# Patient Record
Sex: Female | Born: 1970 | Race: Black or African American | Hispanic: No | Marital: Single | Smoking: Former smoker
Health system: Southern US, Community
[De-identification: ages and names within clinical notes are randomized; demographics above are authoritative.]

## PROBLEM LIST (undated history)

## (undated) ENCOUNTER — Observation Stay: Admission: AD | Payer: Self-pay | Source: Ambulatory Visit | Admitting: Cardiology

## (undated) DIAGNOSIS — F319 Bipolar disorder, unspecified: Secondary | ICD-10-CM

## (undated) DIAGNOSIS — Z8719 Personal history of other diseases of the digestive system: Secondary | ICD-10-CM

## (undated) DIAGNOSIS — E119 Type 2 diabetes mellitus without complications: Secondary | ICD-10-CM

## (undated) DIAGNOSIS — F419 Anxiety disorder, unspecified: Secondary | ICD-10-CM

## (undated) DIAGNOSIS — Z9989 Dependence on other enabling machines and devices: Secondary | ICD-10-CM

## (undated) DIAGNOSIS — I639 Cerebral infarction, unspecified: Secondary | ICD-10-CM

## (undated) DIAGNOSIS — R519 Headache, unspecified: Secondary | ICD-10-CM

## (undated) DIAGNOSIS — I10 Essential (primary) hypertension: Secondary | ICD-10-CM

## (undated) DIAGNOSIS — K219 Gastro-esophageal reflux disease without esophagitis: Secondary | ICD-10-CM

## (undated) DIAGNOSIS — M545 Low back pain, unspecified: Secondary | ICD-10-CM

## (undated) DIAGNOSIS — R51 Headache: Secondary | ICD-10-CM

## (undated) DIAGNOSIS — Z8711 Personal history of peptic ulcer disease: Secondary | ICD-10-CM

## (undated) DIAGNOSIS — F329 Major depressive disorder, single episode, unspecified: Secondary | ICD-10-CM

## (undated) DIAGNOSIS — E785 Hyperlipidemia, unspecified: Secondary | ICD-10-CM

## (undated) DIAGNOSIS — M1990 Unspecified osteoarthritis, unspecified site: Secondary | ICD-10-CM

## (undated) DIAGNOSIS — J42 Unspecified chronic bronchitis: Secondary | ICD-10-CM

## (undated) DIAGNOSIS — G43909 Migraine, unspecified, not intractable, without status migrainosus: Secondary | ICD-10-CM

## (undated) DIAGNOSIS — G4733 Obstructive sleep apnea (adult) (pediatric): Secondary | ICD-10-CM

## (undated) DIAGNOSIS — I2510 Atherosclerotic heart disease of native coronary artery without angina pectoris: Secondary | ICD-10-CM

## (undated) DIAGNOSIS — Z780 Asymptomatic menopausal state: Secondary | ICD-10-CM

## (undated) DIAGNOSIS — G8929 Other chronic pain: Secondary | ICD-10-CM

## (undated) DIAGNOSIS — F32A Depression, unspecified: Secondary | ICD-10-CM

## (undated) HISTORY — DX: Major depressive disorder, single episode, unspecified: F32.9

## (undated) HISTORY — DX: Bipolar disorder, unspecified: F31.9

## (undated) HISTORY — DX: Depression, unspecified: F32.A

## (undated) HISTORY — PX: CARPAL TUNNEL RELEASE: SHX101

## (undated) HISTORY — DX: Type 2 diabetes mellitus without complications: E11.9

## (undated) HISTORY — DX: Essential (primary) hypertension: I10

## (undated) HISTORY — DX: Asymptomatic menopausal state: Z78.0

## (undated) HISTORY — PX: BUNIONECTOMY: SHX129

## (undated) HISTORY — PX: HAMMER TOE SURGERY: SHX385

---

## 2005-12-30 HISTORY — PX: ABDOMINAL HYSTERECTOMY: SHX81

## 2008-12-30 DIAGNOSIS — E119 Type 2 diabetes mellitus without complications: Secondary | ICD-10-CM

## 2008-12-30 HISTORY — DX: Type 2 diabetes mellitus without complications: E11.9

## 2009-04-06 ENCOUNTER — Emergency Department (HOSPITAL_COMMUNITY): Admission: EM | Admit: 2009-04-06 | Discharge: 2009-04-06 | Payer: Self-pay | Admitting: Emergency Medicine

## 2009-05-16 ENCOUNTER — Emergency Department (HOSPITAL_COMMUNITY): Admission: EM | Admit: 2009-05-16 | Discharge: 2009-05-16 | Payer: Self-pay | Admitting: Family Medicine

## 2009-05-16 ENCOUNTER — Observation Stay (HOSPITAL_COMMUNITY): Admission: EM | Admit: 2009-05-16 | Discharge: 2009-05-16 | Payer: Self-pay | Admitting: Emergency Medicine

## 2009-06-09 ENCOUNTER — Emergency Department (HOSPITAL_COMMUNITY): Admission: EM | Admit: 2009-06-09 | Discharge: 2009-06-09 | Payer: Self-pay | Admitting: Emergency Medicine

## 2010-11-11 ENCOUNTER — Encounter: Payer: Self-pay | Admitting: Emergency Medicine

## 2010-11-12 ENCOUNTER — Ambulatory Visit: Payer: Self-pay | Admitting: Psychiatry

## 2010-11-12 ENCOUNTER — Inpatient Hospital Stay (HOSPITAL_COMMUNITY)
Admission: AD | Admit: 2010-11-12 | Discharge: 2010-11-14 | Payer: Self-pay | Source: Home / Self Care | Admitting: Psychiatry

## 2010-11-21 ENCOUNTER — Ambulatory Visit (HOSPITAL_COMMUNITY): Payer: Self-pay | Admitting: Psychiatry

## 2010-12-10 ENCOUNTER — Ambulatory Visit (HOSPITAL_COMMUNITY): Payer: Self-pay | Admitting: Marriage and Family Therapist

## 2010-12-12 ENCOUNTER — Ambulatory Visit (HOSPITAL_COMMUNITY): Payer: Self-pay | Admitting: Psychiatry

## 2010-12-19 ENCOUNTER — Ambulatory Visit (HOSPITAL_COMMUNITY): Payer: Self-pay | Admitting: Marriage and Family Therapist

## 2011-01-07 ENCOUNTER — Ambulatory Visit (HOSPITAL_COMMUNITY)
Admission: RE | Admit: 2011-01-07 | Discharge: 2011-01-07 | Payer: Self-pay | Source: Home / Self Care | Attending: Psychiatry | Admitting: Psychiatry

## 2011-01-07 ENCOUNTER — Ambulatory Visit (HOSPITAL_COMMUNITY)
Admission: RE | Admit: 2011-01-07 | Payer: Self-pay | Source: Home / Self Care | Admitting: Marriage and Family Therapist

## 2011-01-09 ENCOUNTER — Ambulatory Visit (HOSPITAL_COMMUNITY)
Admission: RE | Admit: 2011-01-09 | Discharge: 2011-01-09 | Payer: Self-pay | Source: Home / Self Care | Attending: Psychiatry | Admitting: Psychiatry

## 2011-01-16 ENCOUNTER — Ambulatory Visit (HOSPITAL_COMMUNITY)
Admission: RE | Admit: 2011-01-16 | Discharge: 2011-01-16 | Payer: Self-pay | Source: Home / Self Care | Attending: Marriage and Family Therapist | Admitting: Marriage and Family Therapist

## 2011-01-28 IMAGING — CR DG CHEST 1V PORT
1 series · 1 of 1 positions shown · non-contrast
Comparison: None

CLINICAL DATA: Chest pain.  Hypertension.  Smoking history.

PORTABLE CHEST - 1 VIEW

[view not recorded]
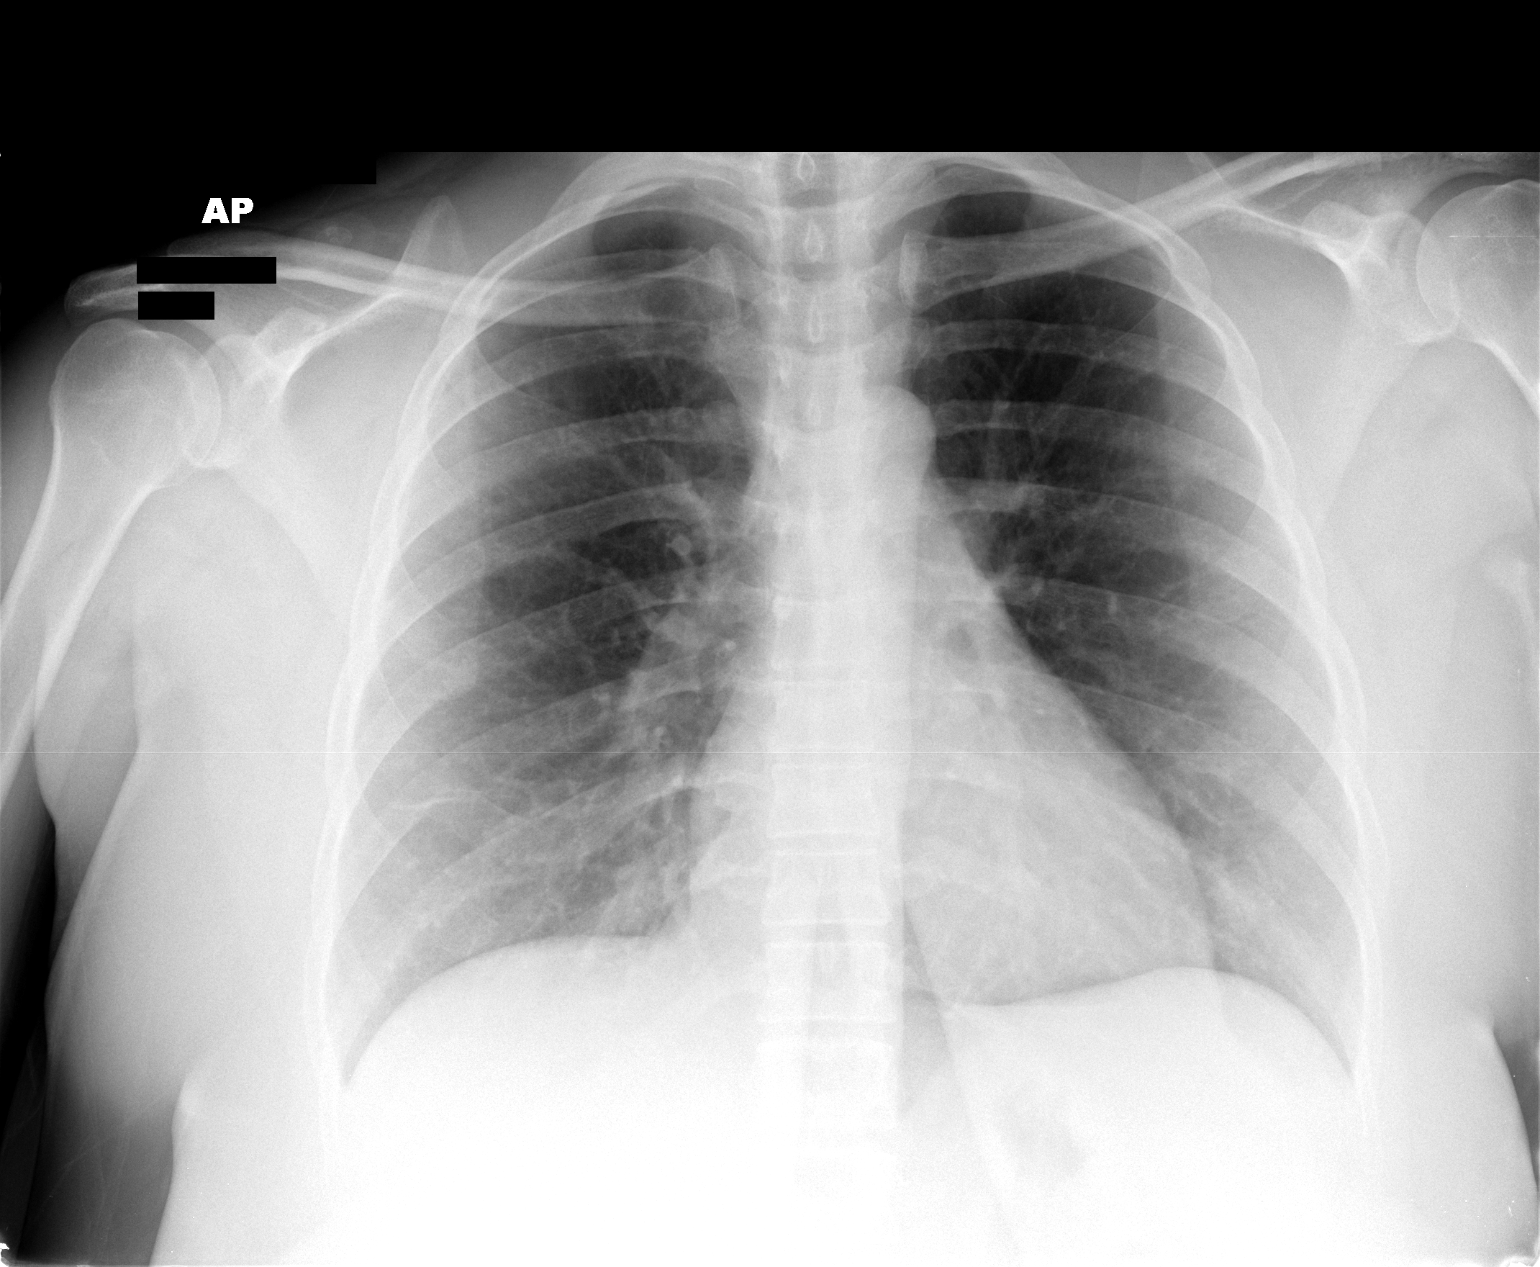

[1 of 1 positions shown; findings below may reference images not displayed]

FINDINGS: Heart size is at the upper limits of normal.  There is
slight interstitial pulmonary prominence.  This probably relates to
the smoking history.  No infiltrate, major collapse or effusion.
Vascularity does not suggest heart failure.
IMPRESSION: Slightly prominent interstitial markings probably relating to a
smoking history.  No active process suspected otherwise.

## 2011-01-29 ENCOUNTER — Ambulatory Visit (HOSPITAL_COMMUNITY)
Admission: RE | Admit: 2011-01-29 | Discharge: 2011-01-29 | Payer: Self-pay | Source: Home / Self Care | Attending: Marriage and Family Therapist | Admitting: Marriage and Family Therapist

## 2011-02-06 ENCOUNTER — Encounter (HOSPITAL_COMMUNITY): Payer: Self-pay | Admitting: Marriage and Family Therapist

## 2011-02-11 ENCOUNTER — Encounter (HOSPITAL_COMMUNITY): Payer: Medicare Other | Admitting: Psychiatry

## 2011-02-11 DIAGNOSIS — F3189 Other bipolar disorder: Secondary | ICD-10-CM

## 2011-02-19 ENCOUNTER — Encounter (HOSPITAL_COMMUNITY): Payer: Medicare Other | Admitting: Marriage and Family Therapist

## 2011-02-19 DIAGNOSIS — F3160 Bipolar disorder, current episode mixed, unspecified: Secondary | ICD-10-CM

## 2011-03-11 ENCOUNTER — Encounter (HOSPITAL_COMMUNITY): Payer: Medicare Other | Admitting: Marriage and Family Therapist

## 2011-03-12 LAB — URINE CULTURE
Colony Count: NO GROWTH
Culture  Setup Time: 201111140002
Culture: NO GROWTH

## 2011-03-12 LAB — COMPREHENSIVE METABOLIC PANEL
ALT: 17 U/L (ref 0–35)
AST: 22 U/L (ref 0–37)
Albumin: 3.9 g/dL (ref 3.5–5.2)
Alkaline Phosphatase: 89 U/L (ref 39–117)
BUN: 10 mg/dL (ref 6–23)
CO2: 24 mEq/L (ref 19–32)
Calcium: 8.8 mg/dL (ref 8.4–10.5)
Chloride: 106 mEq/L (ref 96–112)
Creatinine, Ser: 0.78 mg/dL (ref 0.4–1.2)
GFR calc Af Amer: >60 mL/min (ref >60)
GFR calc non Af Amer: >60 mL/min (ref >60)
Glucose, Bld: 181 mg/dL — ABNORMAL HIGH (ref 70–99)
Potassium: 4 mEq/L (ref 3.5–5.1)
Sodium: 140 mEq/L (ref 135–145)
Total Bilirubin: 0.2 mg/dL — ABNORMAL LOW (ref 0.3–1.2)
Total Protein: 7.4 g/dL (ref 6.0–8.3)

## 2011-03-12 LAB — RAPID URINE DRUG SCREEN, HOSP PERFORMED
Amphetamines: NOT DETECTED
Barbiturates: NOT DETECTED
Benzodiazepines: POSITIVE — AB
Cocaine: NOT DETECTED
Opiates: NOT DETECTED
Tetrahydrocannabinol: NOT DETECTED

## 2011-03-12 LAB — PROTIME-INR
INR: 0.97 (ref 0.00–1.49)
Prothrombin Time: 13.1 seconds (ref 11.6–15.2)

## 2011-03-12 LAB — POCT PREGNANCY, URINE: Preg Test, Ur: NEGATIVE

## 2011-03-12 LAB — CBC
HCT: 40.1 % (ref 36.0–46.0)
Hemoglobin: 13.3 g/dL (ref 12.0–15.0)
MCH: 27.1 pg (ref 26.0–34.0)
MCHC: 33.2 g/dL (ref 30.0–36.0)
MCV: 81.7 fL (ref 78.0–100.0)
Platelets: 208 10*3/uL (ref 150–400)
RBC: 4.91 MIL/uL (ref 3.87–5.11)
RDW: 14.3 % (ref 11.5–15.5)
WBC: 9.3 10*3/uL (ref 4.0–10.5)

## 2011-03-12 LAB — GLUCOSE, CAPILLARY
Glucose-Capillary: 141 mg/dL — ABNORMAL HIGH (ref 70–99)
Glucose-Capillary: 191 mg/dL — ABNORMAL HIGH (ref 70–99)
Glucose-Capillary: 158 mg/dL — ABNORMAL HIGH (ref 70–99)
Glucose-Capillary: 182 mg/dL — ABNORMAL HIGH (ref 70–99)
Glucose-Capillary: 187 mg/dL — ABNORMAL HIGH (ref 70–99)
Glucose-Capillary: 272 mg/dL — ABNORMAL HIGH (ref 70–99)

## 2011-03-12 LAB — ETHANOL
Alcohol, Ethyl (B): <5 mg/dL (ref 0–10)
Alcohol, Ethyl (B): <5 mg/dL (ref 0–10)

## 2011-03-12 LAB — URINALYSIS, ROUTINE W REFLEX MICROSCOPIC
Bilirubin Urine: NEGATIVE
Glucose, UA: 100 mg/dL — AB
Hgb urine dipstick: NEGATIVE
Ketones, ur: NEGATIVE mg/dL
Nitrite: NEGATIVE
Protein, ur: NEGATIVE mg/dL
Specific Gravity, Urine: 1.008 (ref 1.005–1.030)
Urobilinogen, UA: 0.2 mg/dL (ref 0.0–1.0)
pH: 7 (ref 5.0–8.0)

## 2011-03-12 LAB — DIFFERENTIAL
Basophils Absolute: 0 10*3/uL (ref 0.0–0.1)
Basophils Relative: 0 % (ref 0–1)
Eosinophils Absolute: 0.1 10*3/uL (ref 0.0–0.7)
Eosinophils Relative: 1 % (ref 0–5)
Lymphocytes Relative: 38 % (ref 12–46)
Lymphs Abs: 3.5 10*3/uL (ref 0.7–4.0)
Monocytes Absolute: 0.5 10*3/uL (ref 0.1–1.0)
Monocytes Relative: 6 % (ref 3–12)
Neutro Abs: 5.1 10*3/uL (ref 1.7–7.7)
Neutrophils Relative %: 55 % (ref 43–77)

## 2011-03-12 LAB — ACETAMINOPHEN LEVEL
Acetaminophen (Tylenol), Serum: <10 ug/mL — ABNORMAL LOW (ref 10–30)
Acetaminophen (Tylenol), Serum: <10 ug/mL — ABNORMAL LOW (ref 10–30)

## 2011-03-12 LAB — APTT: aPTT: 30 seconds (ref 24–37)

## 2011-03-12 LAB — SALICYLATE LEVEL
Salicylate Lvl: <4 mg/dL (ref 2.8–20.0)
Salicylate Lvl: <4 mg/dL (ref 2.8–20.0)

## 2011-03-13 ENCOUNTER — Encounter (HOSPITAL_COMMUNITY): Payer: Medicare Other | Admitting: Psychiatry

## 2011-03-13 DIAGNOSIS — F3189 Other bipolar disorder: Secondary | ICD-10-CM

## 2011-03-14 ENCOUNTER — Encounter (HOSPITAL_COMMUNITY): Payer: Medicare Other | Admitting: Marriage and Family Therapist

## 2011-03-14 DIAGNOSIS — F3160 Bipolar disorder, current episode mixed, unspecified: Secondary | ICD-10-CM

## 2011-03-20 ENCOUNTER — Encounter (HOSPITAL_COMMUNITY): Payer: Medicare Other | Admitting: Marriage and Family Therapist

## 2011-03-20 DIAGNOSIS — F3160 Bipolar disorder, current episode mixed, unspecified: Secondary | ICD-10-CM

## 2011-03-21 ENCOUNTER — Encounter (HOSPITAL_COMMUNITY): Payer: Medicare Other | Admitting: Marriage and Family Therapist

## 2011-03-27 ENCOUNTER — Encounter (HOSPITAL_COMMUNITY): Payer: Medicare Other | Admitting: Marriage and Family Therapist

## 2011-03-28 ENCOUNTER — Emergency Department (HOSPITAL_COMMUNITY)
Admission: EM | Admit: 2011-03-28 | Discharge: 2011-03-28 | Discharge status: Against Medical Advice | Payer: Medicare Other | Attending: Emergency Medicine | Admitting: Emergency Medicine

## 2011-03-28 ENCOUNTER — Encounter (HOSPITAL_BASED_OUTPATIENT_CLINIC_OR_DEPARTMENT_OTHER): Payer: Medicare Other | Admitting: Marriage and Family Therapist

## 2011-03-28 DIAGNOSIS — F3289 Other specified depressive episodes: Secondary | ICD-10-CM | POA: Insufficient documentation

## 2011-03-28 DIAGNOSIS — F3160 Bipolar disorder, current episode mixed, unspecified: Secondary | ICD-10-CM

## 2011-03-28 DIAGNOSIS — F329 Major depressive disorder, single episode, unspecified: Secondary | ICD-10-CM | POA: Insufficient documentation

## 2011-03-28 DIAGNOSIS — E119 Type 2 diabetes mellitus without complications: Secondary | ICD-10-CM | POA: Insufficient documentation

## 2011-03-28 LAB — COMPREHENSIVE METABOLIC PANEL
ALT: 14 U/L (ref 0–35)
AST: 17 U/L (ref 0–37)
Albumin: 4.2 g/dL (ref 3.5–5.2)
Alkaline Phosphatase: 83 U/L (ref 39–117)
BUN: 9 mg/dL (ref 6–23)
CO2: 26 mEq/L (ref 19–32)
Calcium: 9.9 mg/dL (ref 8.4–10.5)
Chloride: 105 mEq/L (ref 96–112)
Creatinine, Ser: 0.8 mg/dL (ref 0.4–1.2)
GFR calc Af Amer: >60 mL/min (ref >60)
GFR calc non Af Amer: >60 mL/min (ref >60)
Glucose, Bld: 135 mg/dL — ABNORMAL HIGH (ref 70–99)
Potassium: 3.8 mEq/L (ref 3.5–5.1)
Sodium: 140 mEq/L (ref 135–145)
Total Bilirubin: 0.4 mg/dL (ref 0.3–1.2)
Total Protein: 7.5 g/dL (ref 6.0–8.3)

## 2011-03-28 LAB — DIFFERENTIAL
Basophils Absolute: 0 10*3/uL (ref 0.0–0.1)
Basophils Relative: 0 % (ref 0–1)
Eosinophils Absolute: 0 10*3/uL (ref 0.0–0.7)
Eosinophils Relative: 0 % (ref 0–5)
Lymphocytes Relative: 20 % (ref 12–46)
Lymphs Abs: 2.2 10*3/uL (ref 0.7–4.0)
Monocytes Absolute: 0.5 10*3/uL (ref 0.1–1.0)
Monocytes Relative: 5 % (ref 3–12)
Neutro Abs: 8.2 10*3/uL — ABNORMAL HIGH (ref 1.7–7.7)
Neutrophils Relative %: 75 % (ref 43–77)

## 2011-03-28 LAB — RAPID URINE DRUG SCREEN, HOSP PERFORMED
Amphetamines: NOT DETECTED
Barbiturates: NOT DETECTED
Benzodiazepines: POSITIVE — AB
Cocaine: NOT DETECTED
Opiates: POSITIVE — AB
Tetrahydrocannabinol: NOT DETECTED

## 2011-03-28 LAB — ETHANOL: Alcohol, Ethyl (B): <5 mg/dL (ref 0–10)

## 2011-03-28 LAB — CBC
HCT: 42.7 % (ref 36.0–46.0)
Hemoglobin: 14.6 g/dL (ref 12.0–15.0)
MCH: 27.6 pg (ref 26.0–34.0)
MCHC: 34.2 g/dL (ref 30.0–36.0)
MCV: 80.7 fL (ref 78.0–100.0)
Platelets: 246 10*3/uL (ref 150–400)
RBC: 5.29 MIL/uL — ABNORMAL HIGH (ref 3.87–5.11)
RDW: 14.8 % (ref 11.5–15.5)
WBC: 10.9 10*3/uL — ABNORMAL HIGH (ref 4.0–10.5)

## 2011-03-29 ENCOUNTER — Encounter (HOSPITAL_COMMUNITY): Payer: Medicare Other | Admitting: Psychiatry

## 2011-03-29 DIAGNOSIS — F331 Major depressive disorder, recurrent, moderate: Secondary | ICD-10-CM

## 2011-04-08 ENCOUNTER — Encounter (HOSPITAL_COMMUNITY): Payer: Medicare Other | Admitting: Psychiatry

## 2011-04-08 DIAGNOSIS — F3189 Other bipolar disorder: Secondary | ICD-10-CM

## 2011-04-09 LAB — BASIC METABOLIC PANEL
BUN: 12 mg/dL (ref 6–23)
CO2: 25 mEq/L (ref 19–32)
Calcium: 9.8 mg/dL (ref 8.4–10.5)
Chloride: 92 mEq/L — ABNORMAL LOW (ref 96–112)
Creatinine, Ser: 1.08 mg/dL (ref 0.4–1.2)
GFR calc Af Amer: >60 mL/min (ref >60)
GFR calc non Af Amer: 57 mL/min — ABNORMAL LOW (ref >60)
Glucose, Bld: 580 mg/dL (ref 70–99)
Potassium: 4.9 mEq/L (ref 3.5–5.1)
Sodium: 131 mEq/L — ABNORMAL LOW (ref 135–145)

## 2011-04-09 LAB — URINALYSIS, ROUTINE W REFLEX MICROSCOPIC
Bilirubin Urine: NEGATIVE
Glucose, UA: >1000 mg/dL — AB
Hgb urine dipstick: NEGATIVE
Ketones, ur: 15 mg/dL — AB
Leukocytes, UA: NEGATIVE
Nitrite: NEGATIVE
Protein, ur: NEGATIVE mg/dL
Specific Gravity, Urine: >1.046 — ABNORMAL HIGH (ref 1.005–1.030)
Urobilinogen, UA: 0.2 mg/dL (ref 0.0–1.0)
pH: 6 (ref 5.0–8.0)

## 2011-04-09 LAB — POCT URINALYSIS DIP (DEVICE)
Bilirubin Urine: NEGATIVE
Glucose, UA: 500 mg/dL — AB
Hgb urine dipstick: NEGATIVE
Nitrite: NEGATIVE
Protein, ur: NEGATIVE mg/dL
Specific Gravity, Urine: <=1.005 (ref 1.005–1.030)
Urobilinogen, UA: 0.2 mg/dL (ref 0.0–1.0)
pH: 6 (ref 5.0–8.0)

## 2011-04-09 LAB — GLUCOSE, CAPILLARY
Glucose-Capillary: 215 mg/dL — ABNORMAL HIGH (ref 70–99)
Glucose-Capillary: 298 mg/dL — ABNORMAL HIGH (ref 70–99)
Glucose-Capillary: 369 mg/dL — ABNORMAL HIGH (ref 70–99)
Glucose-Capillary: 369 mg/dL — ABNORMAL HIGH (ref 70–99)
Glucose-Capillary: >600 mg/dL (ref 70–99)
Glucose-Capillary: >600 mg/dL (ref 70–99)

## 2011-04-09 LAB — URINE MICROSCOPIC-ADD ON

## 2011-04-09 LAB — POCT PREGNANCY, URINE: Preg Test, Ur: NEGATIVE

## 2011-04-09 LAB — HEMOGLOBIN A1C

## 2011-04-10 ENCOUNTER — Encounter (HOSPITAL_BASED_OUTPATIENT_CLINIC_OR_DEPARTMENT_OTHER): Payer: Medicare Other | Admitting: Marriage and Family Therapist

## 2011-04-10 DIAGNOSIS — F3160 Bipolar disorder, current episode mixed, unspecified: Secondary | ICD-10-CM

## 2011-04-10 LAB — POCT I-STAT, CHEM 8
BUN: 7 mg/dL (ref 6–23)
Calcium, Ion: 1.13 mmol/L (ref 1.12–1.32)
Chloride: 107 mEq/L (ref 96–112)
Creatinine, Ser: 0.9 mg/dL (ref 0.4–1.2)
Glucose, Bld: 100 mg/dL — ABNORMAL HIGH (ref 70–99)
HCT: 45 % (ref 36.0–46.0)
Hemoglobin: 15.3 g/dL — ABNORMAL HIGH (ref 12.0–15.0)
Potassium: 3.3 mEq/L — ABNORMAL LOW (ref 3.5–5.1)
Sodium: 140 mEq/L (ref 135–145)
TCO2: 25 mmol/L (ref 0–100)

## 2011-04-10 LAB — CBC
HCT: 43 % (ref 36.0–46.0)
Hemoglobin: 14.4 g/dL (ref 12.0–15.0)
MCHC: 33.4 g/dL (ref 30.0–36.0)
MCV: 84.6 fL (ref 78.0–100.0)
Platelets: 215 10*3/uL (ref 150–400)
RBC: 5.09 MIL/uL (ref 3.87–5.11)
RDW: 14.9 % (ref 11.5–15.5)
WBC: 8.6 10*3/uL (ref 4.0–10.5)

## 2011-04-10 LAB — DIFFERENTIAL
Basophils Absolute: 0 10*3/uL (ref 0.0–0.1)
Basophils Relative: 0 % (ref 0–1)
Eosinophils Absolute: 0.2 10*3/uL (ref 0.0–0.7)
Eosinophils Relative: 2 % (ref 0–5)
Lymphocytes Relative: 38 % (ref 12–46)
Lymphs Abs: 3.3 10*3/uL (ref 0.7–4.0)
Monocytes Absolute: 0.6 10*3/uL (ref 0.1–1.0)
Monocytes Relative: 7 % (ref 3–12)
Neutro Abs: 4.6 10*3/uL (ref 1.7–7.7)
Neutrophils Relative %: 53 % (ref 43–77)

## 2011-04-10 LAB — POCT CARDIAC MARKERS
CKMB, poc: <1 ng/mL — ABNORMAL LOW (ref 1.0–8.0)
CKMB, poc: <1 ng/mL — ABNORMAL LOW (ref 1.0–8.0)
Myoglobin, poc: 50.6 ng/mL (ref 12–200)
Myoglobin, poc: 55.8 ng/mL (ref 12–200)
Troponin i, poc: <0.05 ng/mL (ref 0.00–0.09)
Troponin i, poc: <0.05 ng/mL (ref 0.00–0.09)

## 2011-04-15 ENCOUNTER — Encounter (HOSPITAL_COMMUNITY): Payer: Medicare Other | Admitting: Psychiatry

## 2011-04-17 ENCOUNTER — Encounter (HOSPITAL_COMMUNITY): Payer: Medicare Other | Admitting: Marriage and Family Therapist

## 2011-04-24 ENCOUNTER — Encounter (HOSPITAL_COMMUNITY): Payer: Medicare Other | Admitting: Psychiatry

## 2011-04-29 ENCOUNTER — Encounter (HOSPITAL_COMMUNITY): Payer: Medicare Other | Admitting: Psychiatry

## 2011-05-01 ENCOUNTER — Encounter (HOSPITAL_BASED_OUTPATIENT_CLINIC_OR_DEPARTMENT_OTHER): Payer: Medicare Other | Admitting: Marriage and Family Therapist

## 2011-05-01 DIAGNOSIS — F3160 Bipolar disorder, current episode mixed, unspecified: Secondary | ICD-10-CM

## 2011-05-06 ENCOUNTER — Encounter (HOSPITAL_COMMUNITY): Payer: Medicare Other | Admitting: Psychiatry

## 2011-05-06 DIAGNOSIS — F3189 Other bipolar disorder: Secondary | ICD-10-CM

## 2011-05-24 ENCOUNTER — Encounter (HOSPITAL_COMMUNITY): Payer: Medicare Other | Admitting: Psychiatry

## 2011-05-29 ENCOUNTER — Encounter (HOSPITAL_BASED_OUTPATIENT_CLINIC_OR_DEPARTMENT_OTHER): Payer: Medicare Other | Admitting: Marriage and Family Therapist

## 2011-05-29 DIAGNOSIS — F3160 Bipolar disorder, current episode mixed, unspecified: Secondary | ICD-10-CM

## 2011-06-10 ENCOUNTER — Encounter (HOSPITAL_COMMUNITY): Payer: Medicare Other | Admitting: Psychiatry

## 2011-06-10 DIAGNOSIS — F3189 Other bipolar disorder: Secondary | ICD-10-CM

## 2011-06-13 ENCOUNTER — Encounter (HOSPITAL_BASED_OUTPATIENT_CLINIC_OR_DEPARTMENT_OTHER): Payer: Medicare Other | Admitting: Marriage and Family Therapist

## 2011-06-13 DIAGNOSIS — F3160 Bipolar disorder, current episode mixed, unspecified: Secondary | ICD-10-CM

## 2011-06-26 ENCOUNTER — Encounter (HOSPITAL_COMMUNITY): Payer: Medicare Other | Admitting: Psychiatry

## 2011-06-27 ENCOUNTER — Encounter (HOSPITAL_BASED_OUTPATIENT_CLINIC_OR_DEPARTMENT_OTHER): Payer: Medicare Other | Admitting: Marriage and Family Therapist

## 2011-06-27 DIAGNOSIS — F3160 Bipolar disorder, current episode mixed, unspecified: Secondary | ICD-10-CM

## 2011-07-01 ENCOUNTER — Encounter (HOSPITAL_COMMUNITY): Payer: Medicare Other | Admitting: Psychiatry

## 2011-07-22 ENCOUNTER — Encounter (INDEPENDENT_AMBULATORY_CARE_PROVIDER_SITE_OTHER): Payer: Medicare Other | Admitting: Psychiatry

## 2011-07-22 DIAGNOSIS — F3189 Other bipolar disorder: Secondary | ICD-10-CM

## 2011-08-01 ENCOUNTER — Encounter (HOSPITAL_BASED_OUTPATIENT_CLINIC_OR_DEPARTMENT_OTHER): Payer: Medicare Other | Admitting: Marriage and Family Therapist

## 2011-08-01 DIAGNOSIS — F3160 Bipolar disorder, current episode mixed, unspecified: Secondary | ICD-10-CM

## 2011-08-08 ENCOUNTER — Emergency Department (HOSPITAL_COMMUNITY)
Admission: EM | Admit: 2011-08-08 | Discharge: 2011-08-08 | Disposition: A | Discharge status: Home / Self Care | Payer: Medicare Other | Attending: Emergency Medicine | Admitting: Emergency Medicine

## 2011-09-03 ENCOUNTER — Encounter (INDEPENDENT_AMBULATORY_CARE_PROVIDER_SITE_OTHER): Payer: Medicare Other | Admitting: Marriage and Family Therapist

## 2011-09-03 DIAGNOSIS — F3160 Bipolar disorder, current episode mixed, unspecified: Secondary | ICD-10-CM

## 2011-09-16 ENCOUNTER — Encounter (HOSPITAL_COMMUNITY): Payer: Medicare Other | Admitting: Marriage and Family Therapist

## 2011-09-17 ENCOUNTER — Encounter (HOSPITAL_COMMUNITY): Payer: Self-pay | Admitting: Marriage and Family Therapist

## 2011-09-23 ENCOUNTER — Encounter (HOSPITAL_COMMUNITY): Payer: Medicare Other | Admitting: Psychiatry

## 2011-09-25 ENCOUNTER — Encounter (HOSPITAL_COMMUNITY): Payer: Self-pay

## 2011-11-26 ENCOUNTER — Ambulatory Visit (INDEPENDENT_AMBULATORY_CARE_PROVIDER_SITE_OTHER): Payer: Medicare Other | Admitting: Psychology

## 2011-11-26 ENCOUNTER — Encounter (HOSPITAL_COMMUNITY): Payer: Self-pay | Admitting: Psychology

## 2011-11-26 DIAGNOSIS — F315 Bipolar disorder, current episode depressed, severe, with psychotic features: Secondary | ICD-10-CM

## 2011-11-26 DIAGNOSIS — F314 Bipolar disorder, current episode depressed, severe, without psychotic features: Secondary | ICD-10-CM | POA: Insufficient documentation

## 2011-11-26 NOTE — Progress Notes (Signed)
Patient:   Stephanie Thornton   DOB:   1971-02-06  MR Number:  [id number]  Location:  [facility] [address] [place] [address] Dept: [phone]           Date of Service:   11/26/11   Start Time:   10.30am End Time:   10:25am  Provider/Observer:  Forde Radon [facility]       Billing Code/Service: [phone]  Chief Complaint:     Chief Complaint  Patient presents with  . Depression    Bipolard/o, currently depressed    Reason for Service:   I have bipolar and depressed currently.  Pt reports feeling depressed now for few months with thoughts of life not worth living. Sleep disturbance- difficulty falling asleep and staying asleep and waking early.  Some effect in loss of interest and loss of appetite- low energy.  Pt reports hx of bipolar d/o w/ ups and downs.  She reported w/ ups- feeling a lot of energy and motivation to do anything.  Pt reported stressors current of losses w/ death of nephew and her dog in past 2-3 weeks.  Also grief hx w/ her father's murdered and death of aunt and mother from strokes.  Pt also reported currently separated from husband for 3 months but working to resolve their relationship. Pt reported helpful for her to talk things out in past so seeking tx currently.  Current Status:  Pt reports currently depressed mood, w/ sleep disturbance, fleeting thoughts about life not worth living, loss of interest, difficulty concentrating, loss of appetite.  Reliability of Information: Pt provided information.  Behavioral Observation: Stephanie Thornton  presents as a 40 y.o.-year-old  African American Female who appeared her stated age. her dress was Appropriate and she was Well Groomed and her manners were Appropriate to the situation.  There were not any physical disabilities noted.  she displayed an appropriate level of cooperation and motivation.    Interactions:    Active   Attention:   within  normal limits  Memory:   within normal limits  Visuo-spatial:   not examined  Speech (Volume):  low  Speech:   soft  Thought Process:  Coherent  Though Content:  WNL  Orientation:   person, place, time/date and situation  Judgment:   Good  Planning:   Good  Affect:    Depressed  Mood:    Depressed  Insight:   Fair  Intelligence:   normal  Marital Status/Living: Pt is married, but currently separated from her husband over the past 3 weeks.  She is living in [place] w/ her daughters age 14y/o and 18y/o.  She has 2 sons age 23 and 22y/o who live own their own.    Current Employment: disability since 1994.    Past Employment:  Not noted  Substance Use:  No concerns of substance abuse are reported.  Pt denies any use of drugs or alcohol, daily cigarette smoker.  Education:   College  Medical History:   Past Medical History  Diagnosis Date  . Bipolar disorder diagnosed early 55s  . Depression   . Diabetes mellitus type II 2010  . High blood pressure   . Menopause         Outpatient Encounter Prescriptions as of 11/26/2011  Medication Sig Dispense Refill  . diazepam (VALIUM) 10 MG tablet Take 10 mg by mouth every 8 (eight) hours as needed.        Marland Kitchen  FLUoxetine (PROZAC) 10 MG capsule Take 10 mg by mouth daily.        . QUEtiapine (SEROQUEL XR) 200 MG 24 hr tablet Take 200 mg by mouth at bedtime.        Marland Kitchen venlafaxine (EFFEXOR-XR) 150 MG 24 hr capsule Take 150 mg by mouth daily.                Sexual History:   History  Sexual Activity  . Sexually Active: Not on file    Abuse/Trauma History: Pt didn't report in hx of abuse.  Pt reported losses as major stressor.  Psychiatric History:  Pt hx of 3 inpt tx for bipolar d/o.  Pt in outpt tx w/ Joanie Tomar at [facility] from 12/11 to 9/12.  Family Med/Psych History: No family history on file.  Risk of Suicide/Violence: low pt reported SI 2-3 times in the past week, w/ identified stressor of recent death of newphew 2 weeks  ago, age 39, due to vehicle accident.  Pt repors she has attempted sucide 3 times, last Nov 2011 last attempt w/ taking overdose of pills.  Pt denied any SI today, pt denied any intent and no plans of suicide today or over past several weeks.  Pt identified protective factors of faith, prayer, attending church, children and important relationships.  Pt was goals directed about future plans looking forward to.  Impression/DX:  Pt is returning for tx after not being active w/ her counseling in past 3 months. Pt reported hx of bipolar d/o w/ recent depressed mood over the past 2 months.  Pt insight fair, initially unable to identify stressors, but did as session continued. Major stressor current are losses, including nephew, dog and anniversary of dad's murder.  Also pt is recently separated from husband.  Pt has 2 daughters remaining at home w/ her and able to identify support system w/ family.  Pt did report SI, but denied intent or plan and agreed to safety planning.    Disposition/Plan:  Pt to return 1 week for counseling, call crisis services prn. Pt agreed to utilize support system (her sister in law)  and engage in activites of enjoyment (watching football and time w/ daughters) over next week.  Diagnosis:    Axis I:   1. Bipolar I disorder, most recent episode (or current) depressed, severe, specified as with psychotic behavior         Axis II: No diagnosis       Axis III:  High blood pressure, Diabetes      Axis IV:  problems with primary support group          Axis V:  41-50 serious symptoms

## 2011-12-05 ENCOUNTER — Ambulatory Visit (HOSPITAL_COMMUNITY): Payer: Medicare Other | Admitting: Psychology

## 2011-12-13 ENCOUNTER — Ambulatory Visit (HOSPITAL_COMMUNITY): Payer: Medicare Other | Admitting: Psychology

## 2012-02-17 ENCOUNTER — Other Ambulatory Visit: Payer: Self-pay

## 2012-02-17 ENCOUNTER — Observation Stay (HOSPITAL_COMMUNITY)
Admission: EM | Admit: 2012-02-17 | Discharge: 2012-02-18 | Disposition: A | Discharge status: Home / Self Care | Payer: Medicare Other | Attending: Emergency Medicine | Admitting: Emergency Medicine

## 2012-02-17 ENCOUNTER — Emergency Department (HOSPITAL_COMMUNITY): Payer: Medicare Other

## 2012-02-17 DIAGNOSIS — R079 Chest pain, unspecified: Principal | ICD-10-CM | POA: Insufficient documentation

## 2012-02-17 DIAGNOSIS — E119 Type 2 diabetes mellitus without complications: Secondary | ICD-10-CM | POA: Insufficient documentation

## 2012-02-17 DIAGNOSIS — I10 Essential (primary) hypertension: Secondary | ICD-10-CM | POA: Insufficient documentation

## 2012-02-17 DIAGNOSIS — R0602 Shortness of breath: Secondary | ICD-10-CM | POA: Insufficient documentation

## 2012-02-17 DIAGNOSIS — F319 Bipolar disorder, unspecified: Secondary | ICD-10-CM | POA: Insufficient documentation

## 2012-02-17 DIAGNOSIS — R05 Cough: Secondary | ICD-10-CM | POA: Insufficient documentation

## 2012-02-17 DIAGNOSIS — R059 Cough, unspecified: Secondary | ICD-10-CM | POA: Insufficient documentation

## 2012-02-17 DIAGNOSIS — E876 Hypokalemia: Secondary | ICD-10-CM | POA: Insufficient documentation

## 2012-02-17 LAB — COMPREHENSIVE METABOLIC PANEL WITH GFR
ALT: 13 U/L (ref 0–35)
AST: 12 U/L (ref 0–37)
Albumin: 3.4 g/dL — ABNORMAL LOW (ref 3.5–5.2)
Alkaline Phosphatase: 111 U/L (ref 39–117)
BUN: 7 mg/dL (ref 6–23)
CO2: 26 meq/L (ref 19–32)
Calcium: 9.5 mg/dL (ref 8.4–10.5)
Chloride: 100 meq/L (ref 96–112)
Creatinine, Ser: 0.64 mg/dL (ref 0.50–1.10)
GFR calc Af Amer: >90 mL/min
GFR calc non Af Amer: >90 mL/min
Glucose, Bld: 246 mg/dL — ABNORMAL HIGH (ref 70–99)
Potassium: 3.3 meq/L — ABNORMAL LOW (ref 3.5–5.1)
Sodium: 136 meq/L (ref 135–145)
Total Bilirubin: 0.2 mg/dL — ABNORMAL LOW (ref 0.3–1.2)
Total Protein: 7.1 g/dL (ref 6.0–8.3)

## 2012-02-17 LAB — CBC
HCT: 38 % (ref 36.0–46.0)
Hemoglobin: 13.1 g/dL (ref 12.0–15.0)
MCH: 28 pg (ref 26.0–34.0)
MCHC: 34.5 g/dL (ref 30.0–36.0)
MCV: 81.2 fL (ref 78.0–100.0)
Platelets: 236 10*3/uL (ref 150–400)
RBC: 4.68 MIL/uL (ref 3.87–5.11)
RDW: 14.2 % (ref 11.5–15.5)
WBC: 8.5 10*3/uL (ref 4.0–10.5)

## 2012-02-17 LAB — D-DIMER, QUANTITATIVE: D-Dimer, Quant: 0.46 ug{FEU}/mL (ref 0.00–0.48)

## 2012-02-17 LAB — TROPONIN I: Troponin I: <0.3 ng/mL

## 2012-02-17 MED ORDER — LISINOPRIL-HYDROCHLOROTHIAZIDE 20-25 MG PO TABS: 1.0000 | ORAL_TABLET | Freq: Every day | ORAL | Status: DC

## 2012-02-17 MED ORDER — AMLODIPINE BESYLATE 10 MG PO TABS
10.0000 mg | ORAL_TABLET | Freq: Every day | ORAL | Status: DC
  Filled 2012-02-17: qty 1

## 2012-02-17 MED ORDER — HYDROCHLOROTHIAZIDE 25 MG PO TABS
25.0000 mg | ORAL_TABLET | Freq: Every day | ORAL | Status: DC
  Filled 2012-02-17: qty 1

## 2012-02-17 MED ORDER — SODIUM CHLORIDE 0.9 % IV SOLN
1000.0000 mL | INTRAVENOUS | Status: DC
  Administered 2012-02-17: 1000 mL via INTRAVENOUS

## 2012-02-17 MED ORDER — DIAZEPAM 5 MG PO TABS
10.0000 mg | ORAL_TABLET | Freq: Three times a day (TID) | ORAL | Status: DC | PRN
  Administered 2012-02-18: 10 mg via ORAL
  Filled 2012-02-17: qty 2

## 2012-02-17 MED ORDER — BENAZEPRIL HCL 40 MG PO TABS
40.0000 mg | ORAL_TABLET | Freq: Every day | ORAL | Status: DC
  Filled 2012-02-17: qty 1

## 2012-02-17 MED ORDER — FLUOXETINE HCL 10 MG PO CAPS
10.0000 mg | ORAL_CAPSULE | Freq: Every day | ORAL | Status: DC
  Administered 2012-02-18: 10 mg via ORAL
  Filled 2012-02-17: qty 1

## 2012-02-17 MED ORDER — QUETIAPINE FUMARATE ER 200 MG PO TB24
200.0000 mg | ORAL_TABLET | Freq: Every day | ORAL | Status: DC
  Filled 2012-02-17: qty 1

## 2012-02-17 MED ORDER — METFORMIN HCL 850 MG PO TABS
850.0000 mg | ORAL_TABLET | Freq: Two times a day (BID) | ORAL | Status: DC
  Administered 2012-02-18: 850 mg via ORAL
  Filled 2012-02-17 (×2): qty 1

## 2012-02-17 MED ORDER — VENLAFAXINE HCL ER 150 MG PO CP24
150.0000 mg | ORAL_CAPSULE | Freq: Two times a day (BID) | ORAL | Status: DC
  Administered 2012-02-18: 150 mg via ORAL
  Filled 2012-02-17 (×2): qty 1

## 2012-02-17 NOTE — ED Provider Notes (Signed)
11:45 PM Patient to move to CDU for chest pain protocol.  This is a shared visit with Dr Effie Shy.  Plan is for coronary CT in the morning.    12:19 AM Patient has arrived to CDU.  Patient reports central chest pain with radiation into left shoulder with associated shortness of breath and cough that began today.  States pain is currently a 4/10 and she continues to be SOB.  On exam, pt is A&Ox4, NAD, RRR, decreased air movement bilaterally with diffuse wheezing.  I have ordered a nebulizer treatment. Pt requests home dosages of Seroquel and diazepam, which she uses for sleep.  These have been ordered.    Plan is for Coronary CT in the morning.  Patient signed out to Dr Hyacinth Meeker who assumes care of the patient overnight.   Rise Patience, PA 02/18/12 0021  Rise Patience, [place] 02/18/12 [phone]

## 2012-02-17 NOTE — ED Notes (Signed)
Pt reports having chest pain while walking down the hall described as "heaviness" with radiation down left arm. En route pain became reproducible and sharp with palpation. Pt was NSR on monitor en route. Pt had one ntg and 324mg  of ASA. NTG did not help. Pt did report associated shortness of breath.

## 2012-02-17 NOTE — ED Provider Notes (Signed)
History     CSN: [id number]  Arrival date & time 02/17/12  2002   None     Chief Complaint  Patient presents with  . Chest Pain    (Consider location/radiation/quality/duration/timing/severity/associated sxs/prior treatment) Patient is a 41 y.o. female presenting with chest pain. The history is provided by the patient.  Chest Pain The chest pain began 1 - 2 hours ago. Chest pain occurs constantly. At its most intense, the pain is at 10/10. The pain is currently at 7/10. The quality of the pain is described as aching. Radiates to: Radiates to the left arm as a numb feeling. Exacerbated by: Nothing. Pertinent negatives for primary symptoms include no fever, no syncope, no cough, no wheezing and no nausea.  Pertinent negatives for associated symptoms include no claudication, no diaphoresis, no near-syncope and no orthopnea. She tried nitroglycerin and aspirin for the symptoms. 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  Pertinent negatives for past medical history include no congenital heart disease.    no family history of early MI or coronary artery disease. 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 She did not receive ongoing care from a cardiologist. Patient was treated with aspirin, and nitroglycerin, in the field by EMS on the way  In, with partial improvement.      Past Medical History  Diagnosis Date  . Bipolar disorder diagnosed early 10s  . Depression   . Diabetes mellitus type II 2010  . High blood pressure   . Menopause     Past Surgical History  Procedure Date  . Abdominal hysterectomy 2007    Family History  Problem Relation Age of Onset  . Bipolar disorder Mother   . Bipolar disorder Son   . Depression Brother     schizoaffective d/o    History  Substance Use Topics  . Smoking status: Current Everyday Smoker -- 1.0 packs/day for 20 years    Types: Cigarettes  . Smokeless tobacco: Not on file  . Alcohol Use: No    OB  History    Grav Para Term Preterm Abortions TAB SAB Ect Mult Living                  Review of Systems  Constitutional: Negative for fever and diaphoresis.  Respiratory: Negative for cough and wheezing.   Cardiovascular: Positive for chest pain. Negative for orthopnea, claudication, syncope and near-syncope.  Gastrointestinal: Negative for nausea.  All other systems reviewed and are negative.    Allergies  Penicillins and Sulfa antibiotics  Home Medications   Current Outpatient Rx  Name Route Sig Dispense Refill  . AMLODIPINE BESYLATE 10 MG PO TABS Oral Take 10 mg by mouth daily.    Marland Kitchen BENAZEPRIL HCL 40 MG PO TABS Oral Take 40 mg by mouth daily.    Marland Kitchen DIAZEPAM 10 MG PO TABS Oral Take 10 mg by mouth every 8 (eight) hours as needed. For anxiety    . FLUOXETINE HCL 10 MG PO CAPS Oral Take 10 mg by mouth daily.    Marland Kitchen LISINOPRIL-HYDROCHLOROTHIAZIDE 20-25 MG PO TABS Oral Take 1 tablet by mouth daily.    Marland Kitchen METFORMIN HCL 850 MG PO TABS Oral Take 850 mg by mouth 2 (two) times daily with a meal.    . QUETIAPINE FUMARATE ER 200 MG PO TB24 Oral Take 200 mg by mouth at bedtime.      . VENLAFAXINE HCL ER 150 MG PO CP24 Oral Take 150 mg by mouth  2 (two) times daily.      BP 121/79  Pulse 68  Temp(Src) 98.2 F (36.8 C) (Oral)  Resp 16  Ht 5\' 2"  (1.575 m)  Wt 176 lb (79.833 kg)  BMI 32.19 kg/m2  SpO2 99%  Physical Exam  Nursing note and vitals reviewed. Constitutional: She is oriented to person, place, and time. She appears well-developed and well-nourished.  HENT:  Head: Normocephalic and atraumatic.  Eyes: Conjunctivae and EOM are normal. Pupils are equal, round, and reactive to light.  Neck: Normal range of motion and phonation normal. Neck supple.  Cardiovascular: Normal rate, regular rhythm and intact distal pulses.   Pulmonary/Chest: Effort normal and breath sounds normal. She exhibits no tenderness.  Abdominal: Soft. She exhibits no distension. There is no tenderness. There is  no guarding.  Musculoskeletal: Normal range of motion.  Neurological: She is alert and oriented to person, place, and time. She has normal strength. She exhibits normal muscle tone.  Skin: Skin is warm and dry.  Psychiatric: Her behavior is normal. Judgment and thought content normal.       Anxious    ED Course  Procedures (including critical care time)  Date: 02/17/2012  Rate: 81  Rhythm: normal sinus rhythm  QRS Axis: normal  Intervals: normal  ST/T Wave abnormalities: nonspecific T wave changes  Conduction Disutrbances:none  Narrative Interpretation: rate slower. TWA is same  Old EKG Reviewed: changes noted    Labs Reviewed  COMPREHENSIVE METABOLIC PANEL - Abnormal; Notable for the following:    Potassium 3.3 (*)    Glucose, Bld 246 (*)    Albumin 3.4 (*)    Total Bilirubin 0.2 (*)    All other components within normal limits  GLUCOSE, CAPILLARY - Abnormal; Notable for the following:    Glucose-Capillary 261 (*)    All other components within normal limits  CBC  TROPONIN I  TROPONIN I  D-DIMER, QUANTITATIVE   Ct Heart Morp W/cta Cor W/score W/ca W/cm &/or Wo/cm  02/18/2012  *RADIOLOGY REPORT*  INDICATION:  Chest heaviness.  Shortness of breath.  Hypertension. History diabetes.  CT ANGIOGRAPHY OF THE HEART, CORONARY ARTERY, STRUCTURE, AND MORPHOLOGY  CONTRAST:  80 ml Omnipaque 350  COMPARISON:  Plain film of 1 day prior.  No prior CT.  TECHNIQUE:  CT angiography of the coronary vessels was performed on a 256 channel system using prospective ECG gating.  A scout and noncontrast exam (for calcium scoring) were performed.  Circulation time was measured using a test bolus.  Coronary CTA was performed with sub mm slice collimation during portions of the cardiac cycle after prior injection of iodinated contrast.  Imaging post processing was performed on an independent workstation creating multiplanar and 3-D images, and quantitative analysis of the heart and coronary arteries.  Note  that this exam targets the heart and the chest was not imaged in its entirety.  PREMEDICATION: Lopressor 100 mg, P.O. Lopressor 10.0 mg, IV Nitroglycerin 0.4 mcg, sublingual.  FINDINGS: Technical quality:  Moderate.  Limitations include patient's size and persistent heart rate elevation.  Heart rate:  61  CORONARY ARTERIES: Left main coronary artery:  A moderate long vessel, which arises from the left coronary cusp, and is normal. Left anterior descending:  Gives rise to a small to moderate-sized branching first diagonal, which is patent and without definite stenosis.  Small branching distal diagonal is also patent.  Poorly visualized/imaged distally, and likely tapers to terminate after the origin of the distal diagonal, which supplies a portion  of the cardiac apex. Left circumflex:  A moderate sized, nondominant vessel. Immediately gives rise to a diminutive branch which functions as a ramus, and is patent.  Then gives rise to a moderate to large marginal branch.  This is a branching vessel which is without significant disease.  Distally, it is poorly evaluated. Circumflex then continues as a small to moderate-sized vessel.  No stenosis proximally.  Poorly visualized distally but appears patent. Right coronary artery:  A large, dominant vessel which arises from the right coronary cusp.  The gives rise to a conus branch.  An area of misregistration in the mid to distal vessel does not diminish diagnostic quality significantly.  Gives rise to a right ventricular branch which supplies a portion of the inferior wall and the PDA.  Continues to supply the posterior lateral branches. Without significant stenosis. Posterior descending artery:  Patent.  Mildly degraded evaluation. Dominance:  Right-sided  Heart size is upper normal.  No left atrial appendage thrombus.  No septal defect identified.  The aortic valve is tricuspid.  CORONARY CALCIUM: Total Agatston Score:  0  CARDIAC MEASUREMENTS: Interventricular septum (6 -  12 mm):  9 LV posterior wall (6 - 12 mm):  11 LV diameter in diastole (35 - 52 mm):  36  AORTA AND PULMONARY MEASUREMENTS: Aortic root (21 - 40 mm):             21  at the annulus             28  at the sinuses of Valsalva             22  at the sinotubular junction Ascending aorta ( <  40 mm):  22 Descending aorta ( <  40 mm):  20 Main pulmonary artery:  ( <  30 mm):  21  EXTRACARDIAC FINDINGS: Lung windows demonstrate volume loss within the dependent right upper and right middle lobes.  Soft tissue windows demonstrate no central pulmonary embolism, on this non dedicated study.  Soft tissue fullness within the subcarinal/azygo-esophageal recess station.  Measures 1.0 x 1.9 on image 7 of series 81,012.  There is no esophageal obstruction. Borderline left hilar borderline left infrahilar adenopathy, 8 mm on image 7.  No pericardial or pleural effusion.  Limited abdominal imaging demonstrates no significant findings.  No acute osseous abnormality.  IMPRESSION:  1.  No significant coronary artery disease.  The patient's total coronary artery calcium score is 0.  No significant stenosis identified.  The exam is of moderate quality, secondary patient size and heart rate. 2.  Extracardiac findings pertinent for a suggestion of soft tissue fullness about the subcarinal/azygo-esophageal recess station. Suspicious for adenopathy.  Redundant esophagus felt less likely. Consider further characterization with dedicated standard postcontrast chest CT. 3.  Right-sided coronary artery dominance.  Report was called to Remi Haggard, NP at 12:52 pm on 02/18/2012.  Original Report Authenticated By: Consuello Bossier, M.D.   Dg Chest Portable 1 View  02/17/2012  *RADIOLOGY REPORT*  Clinical Data: Left chest pain radiating down left arm, numbness, shortness of breath, former smoker  PORTABLE CHEST - 1 VIEW  Comparison: The chest x-ray of 11/11/2010  Findings: No active infiltrate or effusion is seen.  There is mild cardiomegaly present  which is stable.  No bony abnormality is seen.  IMPRESSION: Cardiomegaly.  No active lung disease.  Original Report Authenticated By: Juline Patch, M.D.   22:19: Patient is comfortable now. She has no chest pain. Her last cardiac  chest CT was 3 or 4 years ago, done in [facility]. Records are not yet available  1. Chest pain       MDM  Nonspecific chest pain with elevated blood cardiac risk factors. Her pain is resolved spontaneously. She is placed in the CDU on chest pain, protocol for cardiac CT in AM.       Flint Melter, MD 02/18/12 [phone]

## 2012-02-17 NOTE — ED Notes (Signed)
Patient ambulatory to tx stretcher. Placed on cardiac monitor showing NSR. Pt is having dull achy reproducible chest pain midsternally. Resp are noted unlabored. Skin noted warm and dry. No acute distress is noted. Aware of plan of care. Awaiting md eval. Brother at bedside.

## 2012-02-18 ENCOUNTER — Observation Stay (HOSPITAL_COMMUNITY): Payer: Medicare Other

## 2012-02-18 LAB — GLUCOSE, CAPILLARY: Glucose-Capillary: 261 mg/dL — ABNORMAL HIGH (ref 70–99)

## 2012-02-18 LAB — TROPONIN I: Troponin I: <0.3 ng/mL (ref <0.30)

## 2012-02-18 MED ORDER — IOHEXOL 350 MG/ML SOLN: 80.0000 mL | Freq: Once | INTRAVENOUS | Status: DC | PRN

## 2012-02-18 MED ORDER — SODIUM CHLORIDE 0.9 % IV SOLN
Freq: Once | INTRAVENOUS | Status: AC
  Administered 2012-02-18: 09:00:00 via INTRAVENOUS

## 2012-02-18 MED ORDER — ALBUTEROL SULFATE (5 MG/ML) 0.5% IN NEBU
5.0000 mg | INHALATION_SOLUTION | RESPIRATORY_TRACT | Status: AC
  Administered 2012-02-18: 5 mg via RESPIRATORY_TRACT
  Filled 2012-02-18: qty 1

## 2012-02-18 MED ORDER — METOPROLOL TARTRATE 25 MG PO TABS
50.0000 mg | ORAL_TABLET | Freq: Once | ORAL | Status: AC
  Administered 2012-02-18: 50 mg via ORAL
  Filled 2012-02-18: qty 2

## 2012-02-18 MED ORDER — METOPROLOL TARTRATE 1 MG/ML IV SOLN
5.0000 mg | Freq: Once | INTRAVENOUS | Status: AC
  Administered 2012-02-18: 5 mg via INTRAVENOUS

## 2012-02-18 MED ORDER — QUETIAPINE FUMARATE 200 MG PO TABS: 200.0000 mg | ORAL_TABLET | Freq: Every day | ORAL | Status: DC

## 2012-02-18 MED ORDER — QUETIAPINE FUMARATE ER 200 MG PO TB24
200.0000 mg | ORAL_TABLET | ORAL | Status: AC
  Administered 2012-02-18: 200 mg via ORAL

## 2012-02-18 MED ORDER — METOPROLOL TARTRATE 1 MG/ML IV SOLN
5.0000 mg | Freq: Once | INTRAVENOUS | Status: AC
  Administered 2012-02-18: 5 mg via INTRAVENOUS
  Filled 2012-02-18: qty 10

## 2012-02-18 MED ORDER — NITROGLYCERIN 0.4 MG SL SUBL
0.4000 mg | SUBLINGUAL_TABLET | Freq: Once | SUBLINGUAL | Status: AC
  Administered 2012-02-18: 0.4 mg via SUBLINGUAL
  Filled 2012-02-18: qty 25

## 2012-02-18 MED ORDER — METOPROLOL TARTRATE 25 MG PO TABS
100.0000 mg | ORAL_TABLET | Freq: Once | ORAL | Status: AC
  Administered 2012-02-18: 100 mg via ORAL
  Filled 2012-02-18: qty 4

## 2012-02-18 NOTE — ED Notes (Signed)
Patient transported to CT via w/c w/telemetry by RN

## 2012-02-18 NOTE — ED Notes (Signed)
Patient returned from CT w/RN.  Pt tolerated procedure well.

## 2012-02-18 NOTE — ED Notes (Signed)
Tresa Endo, CT Tech, aware of new order from Dr Reche Dixon.

## 2012-02-18 NOTE — ED Notes (Signed)
Check cbg it was 261 notified RN Becky of high blood sugar

## 2012-02-18 NOTE — ED Provider Notes (Signed)
Medical screening examination/treatment/procedure(s) were conducted as a shared visit with non-physician practitioner(s) and myself.  I personally evaluated the patient during the encounter  Flint Melter, MD 02/18/12 [phone]

## 2012-02-18 NOTE — ED Notes (Signed)
Pharmacy to send pt's meds. 

## 2012-02-18 NOTE — ED Provider Notes (Signed)
  Physical Exam  BP 105/57  Pulse 72  Temp(Src) 97.8 F (36.6 C) (Oral)  Resp 12  SpO2 97%  Physical Exam  ED Course  Procedures  MDM Change of shift, care assumed from previous physician. Patient presented with a heaviness in her chest with shortness of breath and hypertension. She is improved significantly and has only intermittent twinges of pain. At this time she is sleeping restfully, examination of her lungs and heart shows no tachycardia, no murmur, clear lung fields. We'll proceed with coronary CT in the a.m.  Laboratory results have shown a mild hypokalemia, mild cardiomegaly on the x-ray but no other lung disease, troponins negative x3 and a d-dimer which is normal.      Vida Roller, MD 02/18/12 [phone]

## 2012-02-18 NOTE — Discharge Instructions (Signed)
Follow up with Dr. Jeanella Anton.  Tell her you have sleep apnea.  I think you are on too many sedatives at night as well.  I would hold the valium until you can follow up with  Dr. Jeanella Anton.  I think you need to be on bi-pap at night.  Return for severe pain or other concerns.    Chest Pain (Nonspecific) Chest pain has many causes. Your pain could be caused by something serious, such as a heart attack or a blood clot in the lungs. It could also be caused by something less serious, such as a chest bruise or a virus. Follow up with your doctor. More lab tests or other studies may be needed to find the cause of your pain. Most of the time, nonspecific chest pain will improve within 2 to 3 days of rest and mild pain medicine. HOME CARE  For chest bruises, you may put ice on the sore area for 15 to 20 minutes, 3 to 4 times a day. Do this only if it makes you or your child feel better.   Put ice in a plastic bag.   Place a towel between the skin and the bag.   Rest for the next 2 to 3 days.   Go back to work if the pain improves.   See your doctor if the pain lasts longer than 1 to 2 weeks.   Only take medicine as told by your doctor.   Quit smoking if you smoke.  GET HELP RIGHT AWAY IF:   There is more pain or pain that spreads to the arm, neck, jaw, back, or belly (abdomen).   You or your child has shortness of breath.   You or your child coughs more than usual or coughs up blood.   You or your child has very bad back or belly pain, feels sick to his or her stomach (nauseous), or throws up (vomits).   You or your child has very bad weakness.   You or your child passes out (faints).   You or your child has a temperature by mouth above 102 F (38.9 C), not controlled by medicine.  MAKE SURE YOU:   Understand these instructions.   Will watch this condition.   Will get help right away if you or your child is not doing well or gets worse.  Document Released: 06/03/2008 Document Revised:  08/28/2011 Document Reviewed: 06/03/2008 [facility] Patient Information 2012 [place], [place].Chest Pain (Nonspecific) Chest pain has many causes. Your pain could be caused by something serious, such as a heart attack or a blood clot in the lungs. It could also be caused by something less serious, such as a chest bruise or a virus. Follow up with your doctor. More lab tests or other studies may be needed to find the cause of your pain. Most of the time, nonspecific chest pain will improve within 2 to 3 days of rest and mild pain medicine. HOME CARE  For chest bruises, you may put ice on the sore area for 15 to 20 minutes, 3 to 4 times a day. Do this only if it makes you or your child feel better.   Put ice in a plastic bag.   Place a towel between the skin and the bag.   Rest for the next 2 to 3 days.   Go back to work if the pain improves.   See your doctor if the pain lasts longer than 1 to 2 weeks.   Only take  medicine as told by your doctor.   Quit smoking if you smoke.  GET HELP RIGHT AWAY IF:   There is more pain or pain that spreads to the arm, neck, jaw, back, or belly (abdomen).   You or your child has shortness of breath.   You or your child coughs more than usual or coughs up blood.   You or your child has very bad back or belly pain, feels sick to his or her stomach (nauseous), or throws up (vomits).   You or your child has very bad weakness.   You or your child passes out (faints).   You or your child has a temperature by mouth above 102 F (38.9 C), not controlled by medicine.  MAKE SURE YOU:   Understand these instructions.   Will watch this condition.   Will get help right away if you or your child is not doing well or gets worse.  Document Released: 06/03/2008 Document Revised: 08/28/2011 Document Reviewed: 06/03/2008 [facility] Patient Information 2012 [place], [place].

## 2012-03-25 ENCOUNTER — Ambulatory Visit (HOSPITAL_BASED_OUTPATIENT_CLINIC_OR_DEPARTMENT_OTHER): Payer: Medicare Other | Attending: Family Medicine | Admitting: Radiology

## 2012-03-25 VITALS — Ht 62.5 in | Wt 170.0 lb

## 2012-03-25 DIAGNOSIS — R0989 Other specified symptoms and signs involving the circulatory and respiratory systems: Secondary | ICD-10-CM | POA: Insufficient documentation

## 2012-03-25 DIAGNOSIS — R0609 Other forms of dyspnea: Secondary | ICD-10-CM | POA: Insufficient documentation

## 2012-03-25 DIAGNOSIS — G4730 Sleep apnea, unspecified: Secondary | ICD-10-CM

## 2012-03-25 DIAGNOSIS — G4710 Hypersomnia, unspecified: Secondary | ICD-10-CM | POA: Insufficient documentation

## 2012-03-25 DIAGNOSIS — Z79899 Other long term (current) drug therapy: Secondary | ICD-10-CM | POA: Insufficient documentation

## 2012-03-27 DIAGNOSIS — G4710 Hypersomnia, unspecified: Secondary | ICD-10-CM

## 2012-03-27 DIAGNOSIS — M62838 Other muscle spasm: Secondary | ICD-10-CM

## 2012-03-27 DIAGNOSIS — G4730 Sleep apnea, unspecified: Secondary | ICD-10-CM

## 2012-03-27 DIAGNOSIS — R0609 Other forms of dyspnea: Secondary | ICD-10-CM

## 2012-03-27 DIAGNOSIS — R0989 Other specified symptoms and signs involving the circulatory and respiratory systems: Secondary | ICD-10-CM

## 2012-03-28 NOTE — Procedures (Signed)
NAME:  Stephanie Thornton, Stephanie Thornton              ACCOUNT NO.:  [id number]  MEDICAL RECORD NO.:  [id number]          PATIENT TYPE:  OUT  LOCATION:  SLEEP CENTER                 FACILITY:  [facility]  PHYSICIAN:  Atavia Poppe D. Maple Hudson, MD, FCCP, FACPDATE OF BIRTH:  16-Sep-1972  DATE OF STUDY:  03/25/2012                           NOCTURNAL POLYSOMNOGRAM  REFERRING PHYSICIAN:  Betti D. Pecola Leisure, M.D.  REFERRING PHYSICIAN:  Betti D. Pecola Leisure, MD  INDICATION FOR STUDY:  Hypersomnia with sleep apnea.  EPWORTH SLEEPINESS SCORE:  21/24.  BMI 30.6, weight 170 pounds, height 62.5 inches, neck 15.5 inches.  MEDICATIONS:  Home medications are charted and reviewed.  SLEEP ARCHITECTURE:  Total sleep time 251 minutes with sleep efficiency 62.4%.  Stage I was 10.2%, stage II 89%, stage III absent, REM 0.8% of total sleep time.  Sleep latency 50.5 minutes, REM latency 349.5 minutes.  Awake after sleep onset 100.5 minutes.  Arousal index 10.  BEDTIME MEDICATION:  Diazepam, Seroquel, Effexor XR, and NovoLog.  RESPIRATORY DATA:  Apnea-hypopnea index (AHI) 1.9 per hour.  A total of 8 events was scored, all as hypopneas associated with nonsupine sleep. REM AHI 0.  She did not meet protocol requirements for initiation of split protocol CPAP titration on this study night.  OXYGEN DATA:  Moderately loud snoring with oxygen desaturation to a nadir of 88% and the mean oxygen saturation through the study of 93.6% on room air.  CARDIAC DATA:  Normal sinus rhythm.  MOVEMENT-PARASOMNIA:  A few limb jerks were noted with no effect on sleep.  Bathroom x1.  IMPRESSIONS-RECOMMENDATIONS: 1. Difficulty initiating sleep, with sustained sleep not obtained until     after midnight despite bedtime medications as listed above. 2. Occasional respiratory events with sleep disturbance, within normal     limits, AHI 1.9 per hour (the normal     range for adults is between 0 and 5 events per hour).  Moderately     loud snoring with oxygen  desaturation to a nadir of 88% and mean     oxygen saturation through the study of 93.6% on room air.     Minsa Weddington D. Maple Hudson, MD, [facility], FACP Diplomate, American Board of Sleep Medicine    CDY/MEDQ  D:  03/27/2012 11:00:59  T:  03/28/2012 00:30:11  Job:  [id number]

## 2012-04-05 ENCOUNTER — Encounter (HOSPITAL_COMMUNITY): Payer: Self-pay | Admitting: Family Medicine

## 2012-04-05 ENCOUNTER — Emergency Department (HOSPITAL_COMMUNITY): Payer: Medicare Other

## 2012-04-05 ENCOUNTER — Emergency Department (HOSPITAL_COMMUNITY)
Admission: EM | Admit: 2012-04-05 | Discharge: 2012-04-05 | Disposition: A | Discharge status: Home / Self Care | Payer: Medicare Other | Attending: Emergency Medicine | Admitting: Emergency Medicine

## 2012-04-05 DIAGNOSIS — R1011 Right upper quadrant pain: Secondary | ICD-10-CM | POA: Insufficient documentation

## 2012-04-05 DIAGNOSIS — R10811 Right upper quadrant abdominal tenderness: Secondary | ICD-10-CM | POA: Insufficient documentation

## 2012-04-05 DIAGNOSIS — R1013 Epigastric pain: Secondary | ICD-10-CM | POA: Insufficient documentation

## 2012-04-05 DIAGNOSIS — E119 Type 2 diabetes mellitus without complications: Secondary | ICD-10-CM | POA: Insufficient documentation

## 2012-04-05 DIAGNOSIS — F319 Bipolar disorder, unspecified: Secondary | ICD-10-CM | POA: Insufficient documentation

## 2012-04-05 DIAGNOSIS — R112 Nausea with vomiting, unspecified: Secondary | ICD-10-CM | POA: Insufficient documentation

## 2012-04-05 DIAGNOSIS — I10 Essential (primary) hypertension: Secondary | ICD-10-CM | POA: Insufficient documentation

## 2012-04-05 DIAGNOSIS — R10816 Epigastric abdominal tenderness: Secondary | ICD-10-CM | POA: Insufficient documentation

## 2012-04-05 DIAGNOSIS — F17200 Nicotine dependence, unspecified, uncomplicated: Secondary | ICD-10-CM | POA: Insufficient documentation

## 2012-04-05 DIAGNOSIS — R197 Diarrhea, unspecified: Secondary | ICD-10-CM | POA: Insufficient documentation

## 2012-04-05 LAB — URINALYSIS, ROUTINE W REFLEX MICROSCOPIC
Bilirubin Urine: NEGATIVE
Glucose, UA: NEGATIVE mg/dL
Hgb urine dipstick: NEGATIVE
Ketones, ur: NEGATIVE mg/dL
Nitrite: NEGATIVE
Protein, ur: NEGATIVE mg/dL
Specific Gravity, Urine: 1.021 (ref 1.005–1.030)
Urobilinogen, UA: 1 mg/dL (ref 0.0–1.0)
pH: 7.5 (ref 5.0–8.0)

## 2012-04-05 LAB — DIFFERENTIAL
Basophils Absolute: 0 10*3/uL (ref 0.0–0.1)
Basophils Relative: 0 % (ref 0–1)
Eosinophils Absolute: 0.5 10*3/uL (ref 0.0–0.7)
Eosinophils Relative: 7 % — ABNORMAL HIGH (ref 0–5)
Lymphocytes Relative: 38 % (ref 12–46)
Lymphs Abs: 2.8 10*3/uL (ref 0.7–4.0)
Monocytes Absolute: 0.5 10*3/uL (ref 0.1–1.0)
Monocytes Relative: 7 % (ref 3–12)
Neutro Abs: 3.6 10*3/uL (ref 1.7–7.7)
Neutrophils Relative %: 49 % (ref 43–77)

## 2012-04-05 LAB — CBC
HCT: 38.5 % (ref 36.0–46.0)
Hemoglobin: 12.9 g/dL (ref 12.0–15.0)
MCH: 27.4 pg (ref 26.0–34.0)
MCHC: 33.5 g/dL (ref 30.0–36.0)
MCV: 81.7 fL (ref 78.0–100.0)
Platelets: 213 10*3/uL (ref 150–400)
RBC: 4.71 MIL/uL (ref 3.87–5.11)
RDW: 14.5 % (ref 11.5–15.5)
WBC: 7.5 10*3/uL (ref 4.0–10.5)

## 2012-04-05 LAB — COMPREHENSIVE METABOLIC PANEL
ALT: 15 U/L (ref 0–35)
AST: 14 U/L (ref 0–37)
Albumin: 3.6 g/dL (ref 3.5–5.2)
Alkaline Phosphatase: 107 U/L (ref 39–117)
BUN: 8 mg/dL (ref 6–23)
CO2: 27 mEq/L (ref 19–32)
Calcium: 9.4 mg/dL (ref 8.4–10.5)
Chloride: 101 mEq/L (ref 96–112)
Creatinine, Ser: 0.69 mg/dL (ref 0.50–1.10)
GFR calc Af Amer: >90 mL/min (ref >90)
GFR calc non Af Amer: >90 mL/min (ref >90)
Glucose, Bld: 90 mg/dL (ref 70–99)
Potassium: 3.2 mEq/L — ABNORMAL LOW (ref 3.5–5.1)
Sodium: 138 mEq/L (ref 135–145)
Total Bilirubin: 0.3 mg/dL (ref 0.3–1.2)
Total Protein: 6.9 g/dL (ref 6.0–8.3)

## 2012-04-05 LAB — LIPASE, BLOOD: Lipase: 14 U/L (ref 11–59)

## 2012-04-05 LAB — URINE MICROSCOPIC-ADD ON

## 2012-04-05 LAB — OCCULT BLOOD, POC DEVICE: Fecal Occult Bld: NEGATIVE

## 2012-04-05 LAB — PREGNANCY, URINE: Preg Test, Ur: NEGATIVE

## 2012-04-05 MED ORDER — HYDROCODONE-ACETAMINOPHEN 5-325 MG PO TABS: 1.0000 | ORAL_TABLET | ORAL | Status: AC | PRN

## 2012-04-05 MED ORDER — OMEPRAZOLE 20 MG PO CPDR: DELAYED_RELEASE_CAPSULE | ORAL | Status: DC

## 2012-04-05 MED ORDER — GI COCKTAIL ~~LOC~~
30.0000 mL | Freq: Once | ORAL | Status: AC
  Administered 2012-04-05: 30 mL via ORAL
  Filled 2012-04-05: qty 30

## 2012-04-05 MED ORDER — PANTOPRAZOLE SODIUM 40 MG IV SOLR
40.0000 mg | Freq: Once | INTRAVENOUS | Status: AC
  Administered 2012-04-05: 40 mg via INTRAVENOUS
  Filled 2012-04-05: qty 40

## 2012-04-05 MED ORDER — HYDROCODONE-ACETAMINOPHEN 5-325 MG PO TABS
1.0000 | ORAL_TABLET | Freq: Once | ORAL | Status: AC
  Administered 2012-04-05: 1 via ORAL
  Filled 2012-04-05: qty 1

## 2012-04-05 MED ORDER — SODIUM CHLORIDE 0.9 % IV SOLN
Freq: Once | INTRAVENOUS | Status: AC
  Administered 2012-04-05: 14:00:00 via INTRAVENOUS

## 2012-04-05 NOTE — ED Provider Notes (Signed)
Medical screening examination/treatment/procedure(s) were performed by non-physician practitioner and as supervising physician I was immediately available for consultation/collaboration.   Carleene Cooper III, MD 04/05/12 [phone]

## 2012-04-05 NOTE — ED Provider Notes (Signed)
History     CSN: [id number]  Arrival date & time 04/05/12  1001   First MD Initiated Contact with Patient 04/05/12 1157      Chief Complaint  Patient presents with  . Abdominal Pain    (Consider location/radiation/quality/duration/timing/severity/associated sxs/prior treatment) Patient is a 41 y.o. female presenting with abdominal pain. The history is provided by the patient.  Abdominal Pain The primary symptoms of the illness include abdominal pain, nausea, vomiting and diarrhea. The primary symptoms of the illness do not include fever. The current episode started more than 2 days ago.  The abdominal pain began more than 2 days ago. The pain came on gradually. The abdominal pain has been unchanged since its onset. The abdominal pain is located in the LUQ and epigastric region. The abdominal pain radiates to the left flank. The severity of the abdominal pain is 7/10. The abdominal pain is relieved by nothing. The abdominal pain is exacerbated by eating.  Symptoms associated with the illness do not include chills. Associated symptoms comments: She has had discomfort constantly for 3 weeks. She reports bloody emesis and watery diarrhea without blood. No fever. She is belching frequently. .    Past Medical History  Diagnosis Date  . Bipolar disorder diagnosed early 31s  . Depression   . Diabetes mellitus type II 2010  . High blood pressure   . Menopause     Past Surgical History  Procedure Date  . Abdominal hysterectomy 2007    Family History  Problem Relation Age of Onset  . Bipolar disorder Mother   . Bipolar disorder Son   . Depression Brother     schizoaffective d/o    History  Substance Use Topics  . Smoking status: Current Everyday Smoker -- 1.0 packs/day for 20 years    Types: Cigarettes  . Smokeless tobacco: Not on file  . Alcohol Use: No    OB History    Grav Para Term Preterm Abortions TAB SAB Ect Mult Living                  Review of Systems    Constitutional: Negative for fever and chills.  HENT: Negative.   Respiratory: Negative.   Cardiovascular: Negative.   Gastrointestinal: Positive for nausea, vomiting, abdominal pain and diarrhea.  Musculoskeletal: Negative.   Skin: Negative.   Neurological: Negative.     Allergies  Penicillins and Sulfa antibiotics  Home Medications   Current Outpatient Rx  Name Route Sig Dispense Refill  . AMLODIPINE BESYLATE 10 MG PO TABS Oral Take 10 mg by mouth daily.    Marland Kitchen BENAZEPRIL HCL 40 MG PO TABS Oral Take 40 mg by mouth daily.    Marland Kitchen DIAZEPAM 10 MG PO TABS Oral Take 10 mg by mouth every 8 (eight) hours as needed. For anxiety    . FLUOXETINE HCL 10 MG PO CAPS Oral Take 10 mg by mouth daily.    . INSULIN ASPART 100 UNIT/ML [place] SOLN Subcutaneous Inject 10 Units into the skin 3 (three) times daily before meals. Per sliding scale.    . INSULIN GLARGINE 100 UNIT/ML [place] SOLN Subcutaneous Inject 25 Units into the skin every morning.    Marland Kitchen LISINOPRIL-HYDROCHLOROTHIAZIDE 20-25 MG PO TABS Oral Take 1 tablet by mouth daily.    Marland Kitchen METFORMIN HCL 850 MG PO TABS Oral Take 850 mg by mouth 2 (two) times daily with a meal.    . QUETIAPINE FUMARATE ER 200 MG PO TB24 Oral Take 200 mg by mouth  at bedtime.      . VENLAFAXINE HCL ER 75 MG PO CP24 Oral Take 75 mg by mouth 2 (two) times daily.      BP 108/69  Pulse 69  Temp(Src) 98.8 F (37.1 C) (Oral)  Resp 16  SpO2 98%  Physical Exam  Constitutional: She is oriented to person, place, and time. She appears well-developed and well-nourished.  HENT:  Head: Normocephalic.  Neck: Normal range of motion. Neck supple.  Cardiovascular: Normal rate and regular rhythm.   Pulmonary/Chest: Effort normal and breath sounds normal.  Abdominal: Soft. Bowel sounds are normal. There is tenderness. There is no rebound and no guarding.       Left UQ and epigastric tenderness. No mass, no distention. Abdomen is soft.  Genitourinary:       No CVA tenderness.   Musculoskeletal: Normal range of motion.  Neurological: She is alert and oriented to person, place, and time. No cranial nerve deficit.  Skin: Skin is warm and dry. No rash noted.  Psychiatric: She has a normal mood and affect.    ED Course  Procedures (including critical care time) Results for orders placed during the hospital encounter of 04/05/12  CBC      Component Value Range   WBC 7.5  4.0 - 10.5 (K/uL)   RBC 4.71  3.87 - 5.11 (MIL/uL)   Hemoglobin 12.9  12.0 - 15.0 (g/dL)   HCT 16.1  09.6 - 04.5 (%)   MCV 81.7  78.0 - 100.0 (fL)   MCH 27.4  26.0 - 34.0 (pg)   MCHC 33.5  30.0 - 36.0 (g/dL)   RDW 40.9  81.1 - 91.4 (%)   Platelets 213  150 - 400 (K/uL)  DIFFERENTIAL      Component Value Range   Neutrophils Relative 49  43 - 77 (%)   Neutro Abs 3.6  1.7 - 7.7 (K/uL)   Lymphocytes Relative 38  12 - 46 (%)   Lymphs Abs 2.8  0.7 - 4.0 (K/uL)   Monocytes Relative 7  3 - 12 (%)   Monocytes Absolute 0.5  0.1 - 1.0 (K/uL)   Eosinophils Relative 7 (*) 0 - 5 (%)   Eosinophils Absolute 0.5  0.0 - 0.7 (K/uL)   Basophils Relative 0  0 - 1 (%)   Basophils Absolute 0.0  0.0 - 0.1 (K/uL)  COMPREHENSIVE METABOLIC PANEL      Component Value Range   Sodium 138  135 - 145 (mEq/L)   Potassium 3.2 (*) 3.5 - 5.1 (mEq/L)   Chloride 101  96 - 112 (mEq/L)   CO2 27  19 - 32 (mEq/L)   Glucose, Bld 90  70 - 99 (mg/dL)   BUN 8  6 - 23 (mg/dL)   Creatinine, Ser 7.82  0.50 - 1.10 (mg/dL)   Calcium 9.4  8.4 - 95.6 (mg/dL)   Total Protein 6.9  6.0 - 8.3 (g/dL)   Albumin 3.6  3.5 - 5.2 (g/dL)   AST 14  0 - 37 (U/L)   ALT 15  0 - 35 (U/L)   Alkaline Phosphatase 107  39 - 117 (U/L)   Total Bilirubin 0.3  0.3 - 1.2 (mg/dL)   GFR calc non Af Amer >90  >90 (mL/min)   GFR calc Af Amer >90  >90 (mL/min)  LIPASE, BLOOD      Component Value Range   Lipase 14  11 - 59 (U/L)  URINALYSIS, ROUTINE W REFLEX MICROSCOPIC  Component Value Range   Color, Urine YELLOW  YELLOW    APPearance HAZY (*) CLEAR     Specific Gravity, Urine 1.021  1.005 - 1.030    pH 7.5  5.0 - 8.0    Glucose, UA NEGATIVE  NEGATIVE (mg/dL)   Hgb urine dipstick NEGATIVE  NEGATIVE    Bilirubin Urine NEGATIVE  NEGATIVE    Ketones, ur NEGATIVE  NEGATIVE (mg/dL)   Protein, ur NEGATIVE  NEGATIVE (mg/dL)   Urobilinogen, UA 1.0  0.0 - 1.0 (mg/dL)   Nitrite NEGATIVE  NEGATIVE    Leukocytes, UA SMALL (*) NEGATIVE   PREGNANCY, URINE      Component Value Range   Preg Test, Ur NEGATIVE  NEGATIVE   OCCULT BLOOD, POC DEVICE      Component Value Range   Fecal Occult Bld NEGATIVE    URINE MICROSCOPIC-ADD ON      Component Value Range   Squamous Epithelial / LPF MANY (*) RARE    WBC, UA 0-2  <3 (WBC/hpf)   Bacteria, UA RARE  RARE    Urine-Other MUCOUS PRESENT      Labs Reviewed  DIFFERENTIAL - Abnormal; Notable for the following:    Eosinophils Relative 7 (*)    All other components within normal limits  COMPREHENSIVE METABOLIC PANEL - Abnormal; Notable for the following:    Potassium 3.2 (*)    All other components within normal limits  URINALYSIS, ROUTINE W REFLEX MICROSCOPIC - Abnormal; Notable for the following:    APPearance HAZY (*)    Leukocytes, UA SMALL (*)    All other components within normal limits  URINE MICROSCOPIC-ADD ON - Abnormal; Notable for the following:    Squamous Epithelial / LPF MANY (*)    All other components within normal limits  CBC  LIPASE, BLOOD  PREGNANCY, URINE  OCCULT BLOOD, POC DEVICE  OCCULT BLOOD X 1 CARD TO LAB, STOOL   Dg Abd Acute W/chest  04/05/2012  *RADIOLOGY REPORT*  Clinical Data: Left-sided abdominal pain  ACUTE ABDOMEN SERIES (ABDOMEN 2 VIEW & CHEST 1 VIEW)  Comparison: 02/17/2012  Findings: Cardiomediastinal silhouette is stable.  Stable linear atelectasis or scarring right lower lobe. No acute infiltrate or pulmonary edema.  There is nonspecific nonobstructive bowel gas pattern.  No free abdominal air is noted.  Multiple pelvic phleboliths are noted.  IMPRESSION: No  active disease.  Nonspecific nonobstructive bowel gas pattern. No free abdominal air.  Original Report Authenticated By: Natasha Mead, M.D.     No diagnosis found.  1. Abdominal pain  MDM  No relief with GI cocktail or with IV protonix. Patient is comfortable with Norco. All lab and x-ray testing is unremarkable in this patient with symptoms for greater than 2 weeks. Feel she is safe for discharge and can follow up with GI as referred.         Rodena Medin, PA-C 04/05/12 1525

## 2012-04-05 NOTE — ED Notes (Signed)
Attempted iv access x 2 without success. Iv team notified that pt needs iv start.

## 2012-04-05 NOTE — Discharge Instructions (Signed)
FOLLOW UP WITH DR. MANN FOR FURTHER EVALUATION OF ABDOMINAL PAIN. TAKE MEDICATIONS AS PRESCRIBED. AVOID HIGH FAT, SPICY FOODS FOR NOW UNTIL SYMPTOMS RESOLVE. RETURN HERE WITH ANY SEVERE PAIN OR HIGH FEVER.   Abdominal Pain Abdominal pain can be caused by many things. Your caregiver decides the seriousness of your pain by an examination and possibly blood tests and X-rays. Many cases can be observed and treated at home. Most abdominal pain is not caused by a disease and will probably improve without treatment. However, in many cases, more time must pass before a clear cause of the pain can be found. Before that point, it may not be known if you need more testing, or if hospitalization or surgery is needed. HOME CARE INSTRUCTIONS   Do not take laxatives unless directed by your caregiver.   Take pain medicine only as directed by your caregiver.   Only take over-the-counter or prescription medicines for pain, discomfort, or fever as directed by your caregiver.   Try a clear liquid diet (broth, tea, or water) for as long as directed by your caregiver. Slowly move to a bland diet as tolerated.  SEEK IMMEDIATE MEDICAL CARE IF:   The pain does not go away.   You have a fever.   You keep throwing up (vomiting).   The pain is felt only in portions of the abdomen. Pain in the right side could possibly be appendicitis. In an adult, pain in the left lower portion of the abdomen could be colitis or diverticulitis.   You pass bloody or black tarry stools.  MAKE SURE YOU:   Understand these instructions.   Will watch your condition.   Will get help right away if you are not doing well or get worse.  Document Released: 09/25/2005 Document Revised: 12/05/2011 Document Reviewed: 08/03/2008 [facility] Patient Information 2012 [place], [place].

## 2012-04-05 NOTE — ED Notes (Signed)
Pt sts abdominal pain x 1 week. sts on the left side and flank area. sts N,V,D. Bright blood in stool and vomiting blood.

## 2012-04-21 ENCOUNTER — Encounter (HOSPITAL_COMMUNITY): Payer: Medicare Other | Admitting: Psychology

## 2012-04-21 ENCOUNTER — Encounter (HOSPITAL_COMMUNITY): Payer: Self-pay | Admitting: Psychology

## 2012-04-21 DIAGNOSIS — F314 Bipolar disorder, current episode depressed, severe, without psychotic features: Secondary | ICD-10-CM

## 2012-04-21 NOTE — Progress Notes (Signed)
This encounter was created in error - please disregard.

## 2012-04-21 NOTE — Progress Notes (Signed)
Outpatient Therapist Discharge Summary  DEBROH SIELOFF    1971-04-06   Admission Date: 11/26/11   Discharge Date:  04/21/12 Reason for Discharge:  No shows Diagnosis:  Axis I:   1. Bipolar I disorder, most recent episode (or current) depressed, severe, without mention of psychotic behavior     Axis II:  v71.09  Axis III:  diabetes  Axis IV:  Primary supports  Axis V:  Unknown on d/c  Comments:  3 noshows and no f/u after initial visit w/ provider  Forde Radon

## 2012-07-22 ENCOUNTER — Emergency Department (HOSPITAL_COMMUNITY): Payer: Medicare Other

## 2012-07-22 ENCOUNTER — Encounter (HOSPITAL_COMMUNITY): Payer: Self-pay | Admitting: *Deleted

## 2012-07-22 ENCOUNTER — Emergency Department (HOSPITAL_COMMUNITY)
Admission: EM | Admit: 2012-07-22 | Discharge: 2012-07-22 | Disposition: A | Discharge status: Home / Self Care | Payer: Medicare Other | Attending: Emergency Medicine | Admitting: Emergency Medicine

## 2012-07-22 DIAGNOSIS — S335XXA Sprain of ligaments of lumbar spine, initial encounter: Secondary | ICD-10-CM | POA: Insufficient documentation

## 2012-07-22 DIAGNOSIS — F319 Bipolar disorder, unspecified: Secondary | ICD-10-CM | POA: Insufficient documentation

## 2012-07-22 DIAGNOSIS — F17200 Nicotine dependence, unspecified, uncomplicated: Secondary | ICD-10-CM | POA: Insufficient documentation

## 2012-07-22 DIAGNOSIS — S139XXA Sprain of joints and ligaments of unspecified parts of neck, initial encounter: Secondary | ICD-10-CM | POA: Insufficient documentation

## 2012-07-22 DIAGNOSIS — T148XXA Other injury of unspecified body region, initial encounter: Secondary | ICD-10-CM

## 2012-07-22 DIAGNOSIS — Z794 Long term (current) use of insulin: Secondary | ICD-10-CM | POA: Insufficient documentation

## 2012-07-22 DIAGNOSIS — E119 Type 2 diabetes mellitus without complications: Secondary | ICD-10-CM | POA: Insufficient documentation

## 2012-07-22 DIAGNOSIS — Z79899 Other long term (current) drug therapy: Secondary | ICD-10-CM | POA: Insufficient documentation

## 2012-07-22 DIAGNOSIS — Y93I9 Activity, other involving external motion: Secondary | ICD-10-CM | POA: Insufficient documentation

## 2012-07-22 DIAGNOSIS — Y998 Other external cause status: Secondary | ICD-10-CM | POA: Insufficient documentation

## 2012-07-22 MED ORDER — HYDROCODONE-ACETAMINOPHEN 5-325 MG PO TABS
1.0000 | ORAL_TABLET | Freq: Once | ORAL | Status: AC
  Administered 2012-07-22: 1 via ORAL
  Filled 2012-07-22: qty 1

## 2012-07-22 MED ORDER — CYCLOBENZAPRINE HCL 10 MG PO TABS: 10.0000 mg | ORAL_TABLET | Freq: Three times a day (TID) | ORAL | Status: AC | PRN

## 2012-07-22 MED ORDER — IBUPROFEN 800 MG PO TABS: 800.0000 mg | ORAL_TABLET | Freq: Three times a day (TID) | ORAL | Status: AC

## 2012-07-22 MED ORDER — HYDROCODONE-ACETAMINOPHEN 5-325 MG PO TABS: 1.0000 | ORAL_TABLET | Freq: Three times a day (TID) | ORAL | Status: AC | PRN

## 2012-07-22 MED ORDER — CYCLOBENZAPRINE HCL 10 MG PO TABS
5.0000 mg | ORAL_TABLET | Freq: Once | ORAL | Status: AC
  Administered 2012-07-22: 5 mg via ORAL
  Filled 2012-07-22: qty 1

## 2012-07-22 MED ORDER — HYDROCODONE-ACETAMINOPHEN 5-325 MG PO TABS: 1.0000 | ORAL_TABLET | Freq: Once | ORAL | Status: AC
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
## 2012-07-22 NOTE — ED Provider Notes (Signed)
Medical screening examination/treatment/procedure(s) were performed by non-physician practitioner and as supervising physician I was immediately available for consultation/collaboration.   Charles B. Bernette Mayers, MD 07/22/12 1106
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
## 2012-12-07 ENCOUNTER — Emergency Department (HOSPITAL_COMMUNITY): Payer: Medicare Other

## 2012-12-07 ENCOUNTER — Emergency Department (HOSPITAL_COMMUNITY)
Admission: EM | Admit: 2012-12-07 | Discharge: 2012-12-08 | Disposition: A | Discharge status: Home / Self Care | Payer: Medicare Other | Attending: Emergency Medicine | Admitting: Emergency Medicine

## 2012-12-07 ENCOUNTER — Encounter (HOSPITAL_COMMUNITY): Payer: Self-pay | Admitting: Emergency Medicine

## 2012-12-07 DIAGNOSIS — F17200 Nicotine dependence, unspecified, uncomplicated: Secondary | ICD-10-CM | POA: Insufficient documentation

## 2012-12-07 DIAGNOSIS — R5381 Other malaise: Secondary | ICD-10-CM | POA: Insufficient documentation

## 2012-12-07 DIAGNOSIS — R739 Hyperglycemia, unspecified: Secondary | ICD-10-CM

## 2012-12-07 DIAGNOSIS — IMO0001 Reserved for inherently not codable concepts without codable children: Secondary | ICD-10-CM | POA: Insufficient documentation

## 2012-12-07 DIAGNOSIS — R079 Chest pain, unspecified: Secondary | ICD-10-CM | POA: Insufficient documentation

## 2012-12-07 DIAGNOSIS — Z794 Long term (current) use of insulin: Secondary | ICD-10-CM | POA: Insufficient documentation

## 2012-12-07 DIAGNOSIS — R05 Cough: Secondary | ICD-10-CM | POA: Insufficient documentation

## 2012-12-07 DIAGNOSIS — F329 Major depressive disorder, single episode, unspecified: Secondary | ICD-10-CM | POA: Insufficient documentation

## 2012-12-07 DIAGNOSIS — F3289 Other specified depressive episodes: Secondary | ICD-10-CM | POA: Insufficient documentation

## 2012-12-07 DIAGNOSIS — I10 Essential (primary) hypertension: Secondary | ICD-10-CM | POA: Insufficient documentation

## 2012-12-07 DIAGNOSIS — R112 Nausea with vomiting, unspecified: Secondary | ICD-10-CM | POA: Insufficient documentation

## 2012-12-07 DIAGNOSIS — R630 Anorexia: Secondary | ICD-10-CM | POA: Insufficient documentation

## 2012-12-07 DIAGNOSIS — H538 Other visual disturbances: Secondary | ICD-10-CM | POA: Insufficient documentation

## 2012-12-07 DIAGNOSIS — H539 Unspecified visual disturbance: Secondary | ICD-10-CM | POA: Insufficient documentation

## 2012-12-07 DIAGNOSIS — R42 Dizziness and giddiness: Secondary | ICD-10-CM | POA: Insufficient documentation

## 2012-12-07 DIAGNOSIS — M2550 Pain in unspecified joint: Secondary | ICD-10-CM | POA: Insufficient documentation

## 2012-12-07 DIAGNOSIS — Z79899 Other long term (current) drug therapy: Secondary | ICD-10-CM | POA: Insufficient documentation

## 2012-12-07 DIAGNOSIS — J3489 Other specified disorders of nose and nasal sinuses: Secondary | ICD-10-CM | POA: Insufficient documentation

## 2012-12-07 DIAGNOSIS — R059 Cough, unspecified: Secondary | ICD-10-CM | POA: Insufficient documentation

## 2012-12-07 DIAGNOSIS — Z3202 Encounter for pregnancy test, result negative: Secondary | ICD-10-CM | POA: Insufficient documentation

## 2012-12-07 DIAGNOSIS — R109 Unspecified abdominal pain: Secondary | ICD-10-CM | POA: Insufficient documentation

## 2012-12-07 DIAGNOSIS — F319 Bipolar disorder, unspecified: Secondary | ICD-10-CM | POA: Insufficient documentation

## 2012-12-07 DIAGNOSIS — E1169 Type 2 diabetes mellitus with other specified complication: Secondary | ICD-10-CM | POA: Insufficient documentation

## 2012-12-07 LAB — GLUCOSE, CAPILLARY
Glucose-Capillary: >600 mg/dL (ref 70–99)
Glucose-Capillary: 118 mg/dL — ABNORMAL HIGH (ref 70–99)
Glucose-Capillary: 119 mg/dL — ABNORMAL HIGH (ref 70–99)

## 2012-12-07 LAB — CBC WITH DIFFERENTIAL/PLATELET
Basophils Absolute: 0 10*3/uL (ref 0.0–0.1)
Basophils Absolute: 0.1 10*3/uL (ref 0.0–0.1)
Basophils Relative: 0 % (ref 0–1)
Basophils Relative: 1 % (ref 0–1)
Eosinophils Absolute: 0.1 10*3/uL (ref 0.0–0.7)
Eosinophils Absolute: 0.1 10*3/uL (ref 0.0–0.7)
Eosinophils Relative: 1 % (ref 0–5)
Eosinophils Relative: 2 % (ref 0–5)
HCT: 42.6 % (ref 36.0–46.0)
HCT: 42.1 % (ref 36.0–46.0)
Hemoglobin: 14.4 g/dL (ref 12.0–15.0)
Hemoglobin: 14.6 g/dL (ref 12.0–15.0)
Lymphocytes Relative: 33 % (ref 12–46)
Lymphocytes Relative: 36 % (ref 12–46)
Lymphs Abs: 3 10*3/uL (ref 0.7–4.0)
Lymphs Abs: 3.2 10*3/uL (ref 0.7–4.0)
MCH: 27.6 pg (ref 26.0–34.0)
MCH: 27.9 pg (ref 26.0–34.0)
MCHC: 33.8 g/dL (ref 30.0–36.0)
MCHC: 34.7 g/dL (ref 30.0–36.0)
MCV: 81.8 fL (ref 78.0–100.0)
MCV: 80.3 fL (ref 78.0–100.0)
Monocytes Absolute: 0.5 10*3/uL (ref 0.1–1.0)
Monocytes Absolute: 0.4 10*3/uL (ref 0.1–1.0)
Monocytes Relative: 5 % (ref 3–12)
Monocytes Relative: 5 % (ref 3–12)
Neutro Abs: 5.4 10*3/uL (ref 1.7–7.7)
Neutro Abs: 5.3 10*3/uL (ref 1.7–7.7)
Neutrophils Relative %: 60 % (ref 43–77)
Neutrophils Relative %: 58 % (ref 43–77)
Platelets: 220 10*3/uL (ref 150–400)
Platelets: 201 10*3/uL (ref 150–400)
RBC: 5.21 MIL/uL — ABNORMAL HIGH (ref 3.87–5.11)
RBC: 5.24 MIL/uL — ABNORMAL HIGH (ref 3.87–5.11)
RDW: 14.3 % (ref 11.5–15.5)
RDW: 14.1 % (ref 11.5–15.5)
WBC: 9 10*3/uL (ref 4.0–10.5)
WBC: 9.1 10*3/uL (ref 4.0–10.5)

## 2012-12-07 LAB — BASIC METABOLIC PANEL
BUN: 10 mg/dL (ref 6–23)
BUN: 10 mg/dL (ref 6–23)
CO2: 24 mEq/L (ref 19–32)
CO2: 27 mEq/L (ref 19–32)
Calcium: 9.8 mg/dL (ref 8.4–10.5)
Calcium: 9.7 mg/dL (ref 8.4–10.5)
Chloride: 97 mEq/L (ref 96–112)
Chloride: 99 mEq/L (ref 96–112)
Creatinine, Ser: 0.61 mg/dL (ref 0.50–1.10)
Creatinine, Ser: 0.68 mg/dL (ref 0.50–1.10)
GFR calc Af Amer: >90 mL/min (ref >90)
GFR calc Af Amer: >90 mL/min (ref >90)
GFR calc non Af Amer: >90 mL/min (ref >90)
GFR calc non Af Amer: >90 mL/min (ref >90)
Glucose, Bld: 377 mg/dL — ABNORMAL HIGH (ref 70–99)
Glucose, Bld: 87 mg/dL (ref 70–99)
Potassium: 3.2 mEq/L — ABNORMAL LOW (ref 3.5–5.1)
Potassium: 3.2 mEq/L — ABNORMAL LOW (ref 3.5–5.1)
Sodium: 133 mEq/L — ABNORMAL LOW (ref 135–145)
Sodium: 137 mEq/L (ref 135–145)

## 2012-12-07 LAB — COMPREHENSIVE METABOLIC PANEL
ALT: 11 U/L (ref 0–35)
ALT: 12 U/L (ref 0–35)
AST: 11 U/L (ref 0–37)
AST: 11 U/L (ref 0–37)
Albumin: 3.8 g/dL (ref 3.5–5.2)
Albumin: 3.7 g/dL (ref 3.5–5.2)
Alkaline Phosphatase: 150 U/L — ABNORMAL HIGH (ref 39–117)
Alkaline Phosphatase: 149 U/L — ABNORMAL HIGH (ref 39–117)
BUN: 10 mg/dL (ref 6–23)
BUN: 10 mg/dL (ref 6–23)
CO2: 21 mEq/L (ref 19–32)
CO2: 21 mEq/L (ref 19–32)
Calcium: 9.9 mg/dL (ref 8.4–10.5)
Calcium: 9.9 mg/dL (ref 8.4–10.5)
Chloride: 88 mEq/L — ABNORMAL LOW (ref 96–112)
Chloride: 89 mEq/L — ABNORMAL LOW (ref 96–112)
Creatinine, Ser: 0.74 mg/dL (ref 0.50–1.10)
Creatinine, Ser: 0.76 mg/dL (ref 0.50–1.10)
GFR calc Af Amer: >90 mL/min (ref >90)
GFR calc Af Amer: >90 mL/min (ref >90)
GFR calc non Af Amer: >90 mL/min (ref >90)
GFR calc non Af Amer: >90 mL/min (ref >90)
Glucose, Bld: 798 mg/dL (ref 70–99)
Glucose, Bld: 744 mg/dL (ref 70–99)
Potassium: 3.6 mEq/L (ref 3.5–5.1)
Potassium: 3.7 mEq/L (ref 3.5–5.1)
Sodium: 128 mEq/L — ABNORMAL LOW (ref 135–145)
Sodium: 128 mEq/L — ABNORMAL LOW (ref 135–145)
Total Bilirubin: 0.3 mg/dL (ref 0.3–1.2)
Total Bilirubin: 0.3 mg/dL (ref 0.3–1.2)
Total Protein: 8.1 g/dL (ref 6.0–8.3)
Total Protein: 8.1 g/dL (ref 6.0–8.3)

## 2012-12-07 LAB — URINALYSIS, ROUTINE W REFLEX MICROSCOPIC
Bilirubin Urine: NEGATIVE
Glucose, UA: >1000 mg/dL — AB
Hgb urine dipstick: NEGATIVE
Ketones, ur: NEGATIVE mg/dL
Leukocytes, UA: NEGATIVE
Nitrite: NEGATIVE
Protein, ur: NEGATIVE mg/dL
Specific Gravity, Urine: 1.037 — ABNORMAL HIGH (ref 1.005–1.030)
Urobilinogen, UA: 0.2 mg/dL (ref 0.0–1.0)
pH: 5 (ref 5.0–8.0)

## 2012-12-07 LAB — TROPONIN I: Troponin I: <0.3 ng/mL (ref <0.30)

## 2012-12-07 LAB — PREGNANCY, URINE: Preg Test, Ur: NEGATIVE

## 2012-12-07 LAB — POCT I-STAT 3, VENOUS BLOOD GAS (G3P V)
Acid-base deficit: 3 mmol/L — ABNORMAL HIGH (ref 0.0–2.0)
Bicarbonate: 23 mEq/L (ref 20.0–24.0)
O2 Saturation: 95 %
TCO2: 24 mmol/L (ref 0–100)
pCO2, Ven: 41.4 mmHg — ABNORMAL LOW (ref 45.0–50.0)
pH, Ven: 7.352 — ABNORMAL HIGH (ref 7.250–7.300)
pO2, Ven: 77 mmHg — ABNORMAL HIGH (ref 30.0–45.0)

## 2012-12-07 LAB — URINE MICROSCOPIC-ADD ON

## 2012-12-07 LAB — KETONES, QUALITATIVE: Acetone, Bld: NEGATIVE

## 2012-12-07 MED ORDER — SODIUM CHLORIDE 0.9 % IV BOLUS (SEPSIS)
1000.0000 mL | Freq: Once | INTRAVENOUS | Status: AC
  Administered 2012-12-07: 1000 mL via INTRAVENOUS

## 2012-12-07 MED ORDER — MORPHINE SULFATE 4 MG/ML IJ SOLN
4.0000 mg | Freq: Once | INTRAMUSCULAR | Status: AC
  Administered 2012-12-07: 4 mg via INTRAVENOUS
  Filled 2012-12-07: qty 1

## 2012-12-07 MED ORDER — ONDANSETRON HCL 4 MG/2ML IJ SOLN
4.0000 mg | Freq: Once | INTRAMUSCULAR | Status: AC
  Administered 2012-12-07: 4 mg via INTRAVENOUS
  Filled 2012-12-07: qty 2

## 2012-12-07 MED ORDER — INSULIN ASPART 100 UNIT/ML ~~LOC~~ SOLN
10.0000 [IU] | Freq: Once | SUBCUTANEOUS | Status: AC
  Administered 2012-12-07: 100 [IU] via INTRAVENOUS
  Filled 2012-12-07: qty 10

## 2012-12-07 NOTE — ED Notes (Signed)
Patient is currently in radiology.  ekg and urine request will be done on her return.

## 2012-12-07 NOTE — ED Provider Notes (Signed)
History     CSN: [id number]  Arrival date & time 12/07/12  1621   First MD Initiated Contact with Patient 12/07/12 1740      Chief Complaint  Patient presents with  . Hyperglycemia    (Consider location/radiation/quality/duration/timing/severity/associated sxs/prior treatment) HPI Comments: Patient presents with elevated blood sugar for the past 2 days. States is the reading high and her home meter. She's been compliant with her levemir 45 units twice daily and NovoLog 15 units with meals. Endorses dry cough for the past several days it is nonproductive. No fever, chills. She reports she's been having intermittent abdominal pain and chest pain for the past several days as well. She endorses some dizziness, lightheadedness and blurred vision. She reports no changes in her medications or diet. She reports she's had several episodes of vomiting over the past several weeks.  The history is provided by the patient.    Past Medical History  Diagnosis Date  . Bipolar disorder diagnosed early 36s  . Depression   . Diabetes mellitus type II 2010  . High blood pressure   . Menopause     Past Surgical History  Procedure Date  . Abdominal hysterectomy 2007    Family History  Problem Relation Age of Onset  . Bipolar disorder Mother   . Bipolar disorder Son   . Depression Brother     schizoaffective d/o    History  Substance Use Topics  . Smoking status: Current Every Day Smoker -- 1.0 packs/day for 20 years    Types: Cigarettes  . Smokeless tobacco: Not on file  . Alcohol Use: No    OB History    Grav Para Term Preterm Abortions TAB SAB Ect Mult Living                  Review of Systems  Constitutional: Positive for activity change, appetite change and fatigue. Negative for fever.  HENT: Positive for congestion and rhinorrhea.   Eyes: Positive for visual disturbance.  Respiratory: Positive for cough. Negative for chest tightness and shortness of breath.    Cardiovascular: Positive for chest pain.  Gastrointestinal: Positive for nausea, vomiting and abdominal pain.  Genitourinary: Negative for dysuria.  Musculoskeletal: Positive for myalgias and arthralgias.  Skin: Negative for rash.  Neurological: Positive for dizziness and light-headedness. Negative for weakness and headaches.    Allergies  Penicillins and Sulfa antibiotics  Home Medications   Current Outpatient Rx  Name  Route  Sig  Dispense  Refill  . AMLODIPINE BESYLATE 10 MG PO TABS   Oral   Take 10 mg by mouth daily.         Marland Kitchen BENAZEPRIL HCL 40 MG PO TABS   Oral   Take 40 mg by mouth daily.         Marland Kitchen DIAZEPAM 10 MG PO TABS   Oral   Take 10 mg by mouth every 8 (eight) hours as needed. For anxiety         . FLUOXETINE HCL 10 MG PO CAPS   Oral   Take 10 mg by mouth daily.         . INSULIN ASPART 100 UNIT/ML [place] SOLN   Subcutaneous   Inject 15 Units into the skin 3 (three) times daily before meals. Per sliding scale.         . INSULIN GLARGINE 100 UNIT/ML [place] SOLN   Subcutaneous   Inject 45 Units into the skin 2 (two) times daily.          Marland Kitchen  LISINOPRIL-HYDROCHLOROTHIAZIDE 20-25 MG PO TABS   Oral   Take 1 tablet by mouth daily.         Marland Kitchen METFORMIN HCL 850 MG PO TABS   Oral   Take 850 mg by mouth 2 (two) times daily with a meal.         . OMEPRAZOLE 20 MG PO CPDR   Oral   Take 20 mg by mouth daily.         . QUETIAPINE FUMARATE ER 300 MG PO TB24   Oral   Take 300 mg by mouth at bedtime.         . VENLAFAXINE HCL ER 75 MG PO CP24   Oral   Take 75 mg by mouth 2 (two) times daily.           BP 126/87  Pulse 94  Temp 98.4 F (36.9 C)  Resp 19  SpO2 97%  Physical Exam  Constitutional: She is oriented to person, place, and time. She appears well-developed and well-nourished. No distress.  HENT:  Head: Normocephalic and atraumatic.  Mouth/Throat: Oropharynx is clear and moist. No oropharyngeal exudate.       Dry mucus membranes   Eyes: EOM are normal. Pupils are equal, round, and reactive to light.  Neck: Normal range of motion. Neck supple.  Cardiovascular: Normal rate, regular rhythm and normal heart sounds.   No murmur heard. Pulmonary/Chest: Effort normal and breath sounds normal. No respiratory distress. She exhibits tenderness.  Abdominal: Soft. There is no tenderness. There is no rebound and no guarding.  Musculoskeletal: Normal range of motion. She exhibits no edema and no tenderness.  Neurological: She is alert and oriented to person, place, and time. No cranial nerve deficit. She exhibits normal muscle tone. Coordination normal.    ED Course  Procedures (including critical care time)  Labs Reviewed  CBC WITH DIFFERENTIAL - Abnormal; Notable for the following:    RBC 5.21 (*)     All other components within normal limits  COMPREHENSIVE METABOLIC PANEL - Abnormal; Notable for the following:    Sodium 128 (*)     Chloride 88 (*)     Glucose, Bld 798 (*)     Alkaline Phosphatase 150 (*)     All other components within normal limits  URINALYSIS, ROUTINE W REFLEX MICROSCOPIC - Abnormal; Notable for the following:    Specific Gravity, Urine 1.037 (*)     Glucose, UA >1000 (*)     All other components within normal limits  CBC WITH DIFFERENTIAL - Abnormal; Notable for the following:    RBC 5.24 (*)     All other components within normal limits  COMPREHENSIVE METABOLIC PANEL - Abnormal; Notable for the following:    Sodium 128 (*)     Chloride 89 (*)     Glucose, Bld 744 (*)     Alkaline Phosphatase 149 (*)     All other components within normal limits  GLUCOSE, CAPILLARY - Abnormal; Notable for the following:    Glucose-Capillary >600 (*)     All other components within normal limits  POCT I-STAT 3, BLOOD GAS (G3P V) - Abnormal; Notable for the following:    pH, Ven 7.352 (*)     pCO2, Ven 41.4 (*)     pO2, Ven 77.0 (*)     Acid-base deficit 3.0 (*)     All other components within normal limits   URINE MICROSCOPIC-ADD ON - Abnormal; Notable for the following:  Squamous Epithelial / LPF FEW (*)     All other components within normal limits  BASIC METABOLIC PANEL - Abnormal; Notable for the following:    Sodium 133 (*)     Potassium 3.2 (*)     Glucose, Bld 377 (*)     All other components within normal limits  BASIC METABOLIC PANEL - Abnormal; Notable for the following:    Potassium 3.2 (*)     All other components within normal limits  GLUCOSE, CAPILLARY - Abnormal; Notable for the following:    Glucose-Capillary 119 (*)     All other components within normal limits  GLUCOSE, CAPILLARY - Abnormal; Notable for the following:    Glucose-Capillary 118 (*)     All other components within normal limits  KETONES, QUALITATIVE  PREGNANCY, URINE  TROPONIN I   Dg Chest 2 View  12/07/2012  *RADIOLOGY REPORT*  Clinical Data: Cough.  Diabetes.  CHEST - 2 VIEW  Comparison: 02/17/2012  Findings: The lungs are clear without focal infiltrate, edema, pneumothorax or pleural effusion. The cardiopericardial silhouette is within normal limits for size. Imaged bony structures of the thorax are intact.  Tiny nodular density superimposed on the T7 vertebral body is probably a granuloma given the conspicuity relative to its small size.  IMPRESSION: No acute cardiopulmonary process.   Original Report Authenticated By: Kennith Center, M.D.      1. Hyperglycemia       MDM  Hyperglycemia with nausea, vomiting, blurry vision, dizziness. Mild tachycardia, no distress. Abdomen soft and nontender. Initial blood sugar was over 700 with anion gap of 19. Serum ketones negative. CXR negative.  Blood sugars improved with IVF and insulin. No evidence of DKA.  Gap normalized to 10.  Tolerating PO in the ED.    Date: 12/07/2012  Rate: 94  Rhythm: normal sinus rhythm  QRS Axis: normal  Intervals: normal  ST/T Wave abnormalities: nonspecific ST/T changes  Conduction Disutrbances:none  Narrative  Interpretation:   Old EKG Reviewed: unchanged     Glynn Octave, MD 12/08/12 0003

## 2012-12-07 NOTE — ED Notes (Signed)
Pt c/o CBG reading critical high; pt sts taking meds per norm; pt c/o dizziness and blurry vision; pt sts recent cough

## 2012-12-07 NOTE — ED Notes (Addendum)
Pt A.O. X4. NAD. Complains of bilateral leg cramps. EDP aware.  Pt sitting up in bed watching tv. Family at bedside. Given [place] sandwich and beverage. Updated on most recent CBG. 119. No further needs at this time.

## 2012-12-08 NOTE — ED Notes (Addendum)
Pt A.O. X 4. NAD. Denies pain. Denies SOB. Denies N/V/C/D. Ambulatory with no assistance. No further needs at this time. Verbalized understanding of D/C instructions. No further questions.

## 2013-01-05 NOTE — Progress Notes (Signed)
This encounter was created in error - please disregard.

## 2013-01-12 NOTE — Progress Notes (Signed)
This encounter was created in error - please disregard. This encounter was created in error - please disregard. This encounter was created in error - please disregard. 

## 2013-02-26 ENCOUNTER — Other Ambulatory Visit: Payer: Self-pay | Admitting: Family Medicine

## 2013-02-26 DIAGNOSIS — Z1231 Encounter for screening mammogram for malignant neoplasm of breast: Secondary | ICD-10-CM

## 2013-03-23 ENCOUNTER — Ambulatory Visit: Payer: Medicare Other

## 2013-03-30 ENCOUNTER — Ambulatory Visit: Payer: Medicare Other

## 2013-09-09 ENCOUNTER — Encounter (HOSPITAL_COMMUNITY): Payer: Self-pay | Admitting: *Deleted

## 2013-09-09 ENCOUNTER — Observation Stay (HOSPITAL_COMMUNITY)
Admission: EM | Admit: 2013-09-09 | Discharge: 2013-09-11 | Disposition: A | Discharge status: Home / Self Care | Payer: Medicare Other | Attending: Internal Medicine | Admitting: Internal Medicine

## 2013-09-09 DIAGNOSIS — Z794 Long term (current) use of insulin: Secondary | ICD-10-CM | POA: Insufficient documentation

## 2013-09-09 DIAGNOSIS — F319 Bipolar disorder, unspecified: Secondary | ICD-10-CM | POA: Diagnosis present

## 2013-09-09 DIAGNOSIS — I079 Rheumatic tricuspid valve disease, unspecified: Secondary | ICD-10-CM | POA: Insufficient documentation

## 2013-09-09 DIAGNOSIS — K3184 Gastroparesis: Secondary | ICD-10-CM | POA: Insufficient documentation

## 2013-09-09 DIAGNOSIS — R079 Chest pain, unspecified: Secondary | ICD-10-CM | POA: Diagnosis present

## 2013-09-09 DIAGNOSIS — E1100 Type 2 diabetes mellitus with hyperosmolarity without nonketotic hyperglycemic-hyperosmolar coma (NKHHC): Principal | ICD-10-CM | POA: Diagnosis present

## 2013-09-09 DIAGNOSIS — E1149 Type 2 diabetes mellitus with other diabetic neurological complication: Secondary | ICD-10-CM | POA: Insufficient documentation

## 2013-09-09 DIAGNOSIS — Z79899 Other long term (current) drug therapy: Secondary | ICD-10-CM | POA: Insufficient documentation

## 2013-09-09 DIAGNOSIS — R739 Hyperglycemia, unspecified: Secondary | ICD-10-CM

## 2013-09-09 DIAGNOSIS — I10 Essential (primary) hypertension: Secondary | ICD-10-CM | POA: Insufficient documentation

## 2013-09-09 DIAGNOSIS — K219 Gastro-esophageal reflux disease without esophagitis: Secondary | ICD-10-CM | POA: Insufficient documentation

## 2013-09-09 DIAGNOSIS — E119 Type 2 diabetes mellitus without complications: Secondary | ICD-10-CM | POA: Diagnosis present

## 2013-09-09 DIAGNOSIS — R112 Nausea with vomiting, unspecified: Secondary | ICD-10-CM | POA: Diagnosis present

## 2013-09-09 LAB — COMPREHENSIVE METABOLIC PANEL
ALT: 12 U/L (ref 0–35)
AST: 13 U/L (ref 0–37)
Albumin: 3.8 g/dL (ref 3.5–5.2)
Alkaline Phosphatase: 150 U/L — ABNORMAL HIGH (ref 39–117)
BUN: 12 mg/dL (ref 6–23)
CO2: 23 mEq/L (ref 19–32)
Calcium: 9.9 mg/dL (ref 8.4–10.5)
Chloride: 87 mEq/L — ABNORMAL LOW (ref 96–112)
Creatinine, Ser: 0.85 mg/dL (ref 0.50–1.10)
GFR calc Af Amer: >90 mL/min (ref >90)
GFR calc non Af Amer: 84 mL/min — ABNORMAL LOW (ref >90)
Glucose, Bld: 588 mg/dL (ref 70–99)
Potassium: 3.7 mEq/L (ref 3.5–5.1)
Sodium: 127 mEq/L — ABNORMAL LOW (ref 135–145)
Total Bilirubin: 0.3 mg/dL (ref 0.3–1.2)
Total Protein: 8.3 g/dL (ref 6.0–8.3)

## 2013-09-09 LAB — CBC WITH DIFFERENTIAL/PLATELET
Basophils Absolute: 0 10*3/uL (ref 0.0–0.1)
Basophils Relative: 0 % (ref 0–1)
Eosinophils Absolute: 0.2 10*3/uL (ref 0.0–0.7)
Eosinophils Relative: 2 % (ref 0–5)
HCT: 42.9 % (ref 36.0–46.0)
Hemoglobin: 14.9 g/dL (ref 12.0–15.0)
Lymphocytes Relative: 30 % (ref 12–46)
Lymphs Abs: 3.3 10*3/uL (ref 0.7–4.0)
MCH: 28 pg (ref 26.0–34.0)
MCHC: 34.7 g/dL (ref 30.0–36.0)
MCV: 80.6 fL (ref 78.0–100.0)
Monocytes Absolute: 0.6 10*3/uL (ref 0.1–1.0)
Monocytes Relative: 5 % (ref 3–12)
Neutro Abs: 7.1 10*3/uL (ref 1.7–7.7)
Neutrophils Relative %: 63 % (ref 43–77)
Platelets: 306 10*3/uL (ref 150–400)
RBC: 5.32 MIL/uL — ABNORMAL HIGH (ref 3.87–5.11)
RDW: 14.6 % (ref 11.5–15.5)
WBC: 11.2 10*3/uL — ABNORMAL HIGH (ref 4.0–10.5)

## 2013-09-09 LAB — BLOOD GAS, VENOUS
Acid-base deficit: 1 mmol/L (ref 0.0–2.0)
Bicarbonate: 22.1 mEq/L (ref 20.0–24.0)
FIO2: 0.21 %
O2 Saturation: 94 %
Patient temperature: 98.6
TCO2: 18.9 mmol/L (ref 0–100)
pCO2, Ven: 33.9 mmHg — ABNORMAL LOW (ref 45.0–50.0)
pH, Ven: 7.429 — ABNORMAL HIGH (ref 7.250–7.300)
pO2, Ven: 70.8 mmHg — ABNORMAL HIGH (ref 30.0–45.0)

## 2013-09-09 LAB — GLUCOSE, CAPILLARY
Glucose-Capillary: 565 mg/dL (ref 70–99)
Glucose-Capillary: >600 mg/dL (ref 70–99)

## 2013-09-09 MED ORDER — HYDROMORPHONE HCL PF 1 MG/ML IJ SOLN
1.0000 mg | Freq: Once | INTRAMUSCULAR | Status: AC
  Administered 2013-09-10: 1 mg via INTRAVENOUS
  Filled 2013-09-09: qty 1

## 2013-09-09 MED ORDER — SODIUM CHLORIDE 0.9 % IV SOLN: 1000.0000 mL | Freq: Once | INTRAVENOUS | Status: DC

## 2013-09-09 MED ORDER — SODIUM CHLORIDE 0.9 % IV SOLN
1000.0000 mL | Freq: Once | INTRAVENOUS | Status: AC
  Administered 2013-09-10: 1000 mL via INTRAVENOUS

## 2013-09-09 MED ORDER — SODIUM CHLORIDE 0.9 % IV SOLN: 1000.0000 mL | INTRAVENOUS | Status: DC

## 2013-09-09 MED ORDER — SODIUM CHLORIDE 0.9 % IV BOLUS (SEPSIS): 1000.0000 mL | Freq: Once | INTRAVENOUS | Status: DC

## 2013-09-09 MED ORDER — SODIUM CHLORIDE 0.9 % IV SOLN
INTRAVENOUS | Status: DC
  Administered 2013-09-10: 2.6 [IU]/h via INTRAVENOUS
  Administered 2013-09-10: 4.5 [IU]/h via INTRAVENOUS
  Administered 2013-09-10: 5.1 [IU]/h via INTRAVENOUS
  Filled 2013-09-09: qty 1

## 2013-09-09 NOTE — ED Notes (Signed)
Pt states sugar has been up all day; states tonight vision is going away; cbg read >600 at home; abd pain

## 2013-09-09 NOTE — ED Notes (Signed)
Pt states that she is unable to see visual acuity card.  Pt states that the largest numbers on acuity screening is blurry. Pupils remain dilated when light passes thru. Acuity screening unsuccessful.

## 2013-09-09 NOTE — ED Provider Notes (Signed)
CSN: [id number]     Arrival date & time 09/09/13  2211 History   First MD Initiated Contact with Patient 09/09/13 2215     Chief Complaint  Patient presents with  . Hyperglycemia  . vision changes    (Consider location/radiation/quality/duration/timing/severity/associated sxs/prior Treatment) The history is provided by the patient and medical records. No language interpreter was used.    KAIRAH LEONI is a 42 y.o. female  with a hx of DM type II (on insulin), HTN, hyperglycemia presents to the Emergency Department complaining of gradual, persistent, progressively worsening polyuria, polydipsia onset yesterday.  Associated symptoms include abdominal pain, blurry vision, vomiting, muscle cramps.  Nothing makes it better and nothing makes it worse.  Pt denies fever, chills, headache, neck pain, chest pain, SOB, diarrhea, weakness, dizziness, syncope, dysuria.  Pt reports these symptoms are the same as her previous episodes of hyperglycemia. Pt  CBG was > 600 at home tonight.  She reports taking her medications as directed without relief and no changes in her diet or sick contacts.     Past Medical History  Diagnosis Date  . Bipolar disorder diagnosed early 47s  . Depression   . Diabetes mellitus type II 2010  . High blood pressure   . Menopause    Past Surgical History  Procedure Laterality Date  . Abdominal hysterectomy  2007   Family History  Problem Relation Age of Onset  . Bipolar disorder Mother   . Bipolar disorder Son   . Depression Brother     schizoaffective d/o   History  Substance Use Topics  . Smoking status: Current Every Day Smoker -- 1.00 packs/day for 20 years    Types: Cigarettes  . Smokeless tobacco: Not on file  . Alcohol Use: No   OB History   Grav Para Term Preterm Abortions TAB SAB Ect Mult Living                 Review of Systems  Constitutional: Negative for fever, diaphoresis, appetite change, fatigue and unexpected weight change.  HENT:  Negative for mouth sores and neck stiffness.   Eyes: Negative for visual disturbance.  Respiratory: Negative for cough, chest tightness, shortness of breath and wheezing.   Cardiovascular: Negative for chest pain.  Gastrointestinal: Positive for vomiting and abdominal pain. Negative for nausea, diarrhea and constipation.  Endocrine: Positive for polydipsia, polyphagia and polyuria.  Genitourinary: Negative for dysuria, urgency, frequency and hematuria.  Musculoskeletal: Negative for back pain.       Muscle cramps  Skin: Negative for rash.  Allergic/Immunologic: Negative for immunocompromised state.  Neurological: Negative for syncope, light-headedness and headaches.  Hematological: Does not bruise/bleed easily.  Psychiatric/Behavioral: Negative for sleep disturbance. The patient is not nervous/anxious.     Allergies  Penicillins and Sulfa antibiotics  Home Medications   Current Outpatient Rx  Name  Route  Sig  Dispense  Refill  . amLODipine (NORVASC) 10 MG tablet   Oral   Take 10 mg by mouth daily.         . diazepam (VALIUM) 10 MG tablet   Oral   Take 10 mg by mouth every 8 (eight) hours as needed. For anxiety         . FLUoxetine (PROZAC) 10 MG capsule   Oral   Take 10 mg by mouth daily.         . insulin aspart (NOVOLOG) 100 UNIT/ML injection   Subcutaneous   Inject 20 Units into the skin 3 (three) times  daily before meals. Per sliding scale.         . insulin glargine (LANTUS) 100 UNIT/ML injection   Subcutaneous   Inject 85 Units into the skin 2 (two) times daily.          Marland Kitchen lisinopril-hydrochlorothiazide (PRINZIDE,ZESTORETIC) 20-25 MG per tablet   Oral   Take 1 tablet by mouth daily.         . metFORMIN (GLUCOPHAGE) 850 MG tablet   Oral   Take 850 mg by mouth 2 (two) times daily with a meal.         . omeprazole (PRILOSEC) 20 MG capsule   Oral   Take 20 mg by mouth daily.         . QUEtiapine (SEROQUEL XR) 300 MG 24 hr tablet   Oral    Take 300 mg by mouth at bedtime.         Marland Kitchen venlafaxine (EFFEXOR-XR) 75 MG 24 hr capsule   Oral   Take 75 mg by mouth 2 (two) times daily.          BP 141/89  Pulse 94  Temp(Src) 99.1 F (37.3 C)  Resp 18  SpO2 90% Physical Exam  Nursing note and vitals reviewed. Constitutional: She is oriented to person, place, and time. She appears well-developed and well-nourished. No distress.  Awake, alert, nontoxic appearance  HENT:  Head: Normocephalic and atraumatic.  Mouth/Throat: Oropharynx is clear and moist. No oropharyngeal exudate.  Eyes: Conjunctivae are normal. No scleral icterus.  Neck: Normal range of motion. Neck supple.  Cardiovascular: Normal rate, regular rhythm and intact distal pulses.   Pulmonary/Chest: Effort normal and breath sounds normal. No respiratory distress. She has no wheezes.  Abdominal: Soft. Bowel sounds are normal. She exhibits no mass. There is no tenderness. There is no rebound and no guarding.  Musculoskeletal: Normal range of motion. She exhibits no edema.  Palpable and visible muscle cramps in the patients feet  Neurological: She is alert and oriented to person, place, and time.  Speech is clear and goal oriented, follows commands Major Cranial nerves without deficit, no facial droop Normal strength in upper and lower extremities bilaterally including dorsiflexion and plantar flexion, strong and equal grip strength Sensation normal to light and sharp touch Moves extremities without ataxia, coordination intact Normal finger to nose and rapid alternating movements Neg romberg, no pronator drift Normal gait and balance  Skin: Skin is warm and dry. She is not diaphoretic.  Psychiatric: She has a normal mood and affect.    ED Course  Procedures (including critical care time) Labs Review Labs Reviewed  CBC WITH DIFFERENTIAL - Abnormal; Notable for the following:    WBC 11.2 (*)    RBC 5.32 (*)    All other components within normal limits   COMPREHENSIVE METABOLIC PANEL - Abnormal; Notable for the following:    Sodium 127 (*)    Chloride 87 (*)    Glucose, Bld 588 (*)    Alkaline Phosphatase 150 (*)    GFR calc non Af Amer 84 (*)    All other components within normal limits  GLUCOSE, CAPILLARY - Abnormal; Notable for the following:    Glucose-Capillary >600 (*)    All other components within normal limits  GLUCOSE, CAPILLARY - Abnormal; Notable for the following:    Glucose-Capillary 565 (*)    All other components within normal limits  URINALYSIS, ROUTINE W REFLEX MICROSCOPIC - Abnormal; Notable for the following:    Specific Gravity, Urine  1.039 (*)    Glucose, UA >1000 (*)    All other components within normal limits  BLOOD GAS, VENOUS - Abnormal; Notable for the following:    pH, Ven 7.429 (*)    pCO2, Ven 33.9 (*)    pO2, Ven 70.8 (*)    All other components within normal limits  GLUCOSE, CAPILLARY - Abnormal; Notable for the following:    Glucose-Capillary 316 (*)    All other components within normal limits  KETONES, QUALITATIVE  URINE MICROSCOPIC-ADD ON   Imaging Review Dg Chest Port 1 View  09/10/2013   *RADIOLOGY REPORT*  Clinical Data: Hyperglycemia.  Chest pressure  PORTABLE CHEST - 1 VIEW  Comparison: 12/07/2012  Findings: The heart size is normal.  The lungs are suboptimally inflated.  There is plate-like atelectasis noted in the lung bases. No pleural effusion or edema.  No airspace consolidation.  IMPRESSION:  1.  Low lung volumes with plate-like atelectasis.   Original Report Authenticated By: Signa Kell, M.D.    MDM   1. Hyperglycemia     [name] presents with hyperglycemia, nausea, vomiting, blurred vision, epigastric abdominal pain, mild tachycardia and muscle cramps. Will obtain labs, give fluids and begin glucose stabilizer as she is on insulin at home.   1:04 AM Pt with complete resolution of cramps with pain medication.  Hyperglycemia at 588.  AG 17. Patient without acute  tones in serum or urine.  No evidence of DKA.  Reevaluation of abdominal pain with soft and nontender abdomen, no peritoneal signs.  Chest x-ray without signs of infection. UA without signs of UTI.  Glucose stabilizer begun. We'll proceed with admission.  ECG and troponin pending.  Dr Onalee Hua will admit when they have resulted  [facility], PA-C 09/10/13 0142  Curren Mohrmann, PA-C 09/10/13 0151

## 2013-09-09 NOTE — ED Notes (Signed)
Dahlia Client PA notified of critical glucose 588

## 2013-09-10 ENCOUNTER — Other Ambulatory Visit: Payer: Self-pay

## 2013-09-10 ENCOUNTER — Encounter (HOSPITAL_COMMUNITY): Payer: Self-pay

## 2013-09-10 ENCOUNTER — Emergency Department (HOSPITAL_COMMUNITY): Payer: Medicare Other

## 2013-09-10 DIAGNOSIS — R112 Nausea with vomiting, unspecified: Secondary | ICD-10-CM | POA: Diagnosis present

## 2013-09-10 DIAGNOSIS — E1100 Type 2 diabetes mellitus with hyperosmolarity without nonketotic hyperglycemic-hyperosmolar coma (NKHHC): Secondary | ICD-10-CM | POA: Diagnosis present

## 2013-09-10 DIAGNOSIS — R079 Chest pain, unspecified: Secondary | ICD-10-CM | POA: Diagnosis present

## 2013-09-10 DIAGNOSIS — E119 Type 2 diabetes mellitus without complications: Secondary | ICD-10-CM | POA: Diagnosis present

## 2013-09-10 DIAGNOSIS — F319 Bipolar disorder, unspecified: Secondary | ICD-10-CM | POA: Diagnosis present

## 2013-09-10 DIAGNOSIS — R1013 Epigastric pain: Secondary | ICD-10-CM

## 2013-09-10 DIAGNOSIS — I369 Nonrheumatic tricuspid valve disorder, unspecified: Secondary | ICD-10-CM

## 2013-09-10 LAB — URINE MICROSCOPIC-ADD ON

## 2013-09-10 LAB — CBC
HCT: 42.5 % (ref 36.0–46.0)
Hemoglobin: 14.6 g/dL (ref 12.0–15.0)
MCH: 27.8 pg (ref 26.0–34.0)
MCHC: 34.4 g/dL (ref 30.0–36.0)
MCV: 81 fL (ref 78.0–100.0)
Platelets: 282 10*3/uL (ref 150–400)
RBC: 5.25 MIL/uL — ABNORMAL HIGH (ref 3.87–5.11)
RDW: 14.8 % (ref 11.5–15.5)
WBC: 12.8 10*3/uL — ABNORMAL HIGH (ref 4.0–10.5)

## 2013-09-10 LAB — URINALYSIS, ROUTINE W REFLEX MICROSCOPIC
Bilirubin Urine: NEGATIVE
Glucose, UA: >1000 mg/dL — AB
Hgb urine dipstick: NEGATIVE
Ketones, ur: NEGATIVE mg/dL
Leukocytes, UA: NEGATIVE
Nitrite: NEGATIVE
Protein, ur: NEGATIVE mg/dL
Specific Gravity, Urine: 1.039 — ABNORMAL HIGH (ref 1.005–1.030)
Urobilinogen, UA: 0.2 mg/dL (ref 0.0–1.0)
pH: 5 (ref 5.0–8.0)

## 2013-09-10 LAB — GLUCOSE, CAPILLARY
Glucose-Capillary: 316 mg/dL — ABNORMAL HIGH (ref 70–99)
Glucose-Capillary: 287 mg/dL — ABNORMAL HIGH (ref 70–99)
Glucose-Capillary: 273 mg/dL — ABNORMAL HIGH (ref 70–99)
Glucose-Capillary: 143 mg/dL — ABNORMAL HIGH (ref 70–99)
Glucose-Capillary: 166 mg/dL — ABNORMAL HIGH (ref 70–99)
Glucose-Capillary: 139 mg/dL — ABNORMAL HIGH (ref 70–99)
Glucose-Capillary: 171 mg/dL — ABNORMAL HIGH (ref 70–99)

## 2013-09-10 LAB — LIPASE, BLOOD: Lipase: 25 U/L (ref 11–59)

## 2013-09-10 LAB — BASIC METABOLIC PANEL
BUN: 11 mg/dL (ref 6–23)
CO2: 23 mEq/L (ref 19–32)
Calcium: 9.4 mg/dL (ref 8.4–10.5)
Chloride: 90 mEq/L — ABNORMAL LOW (ref 96–112)
Creatinine, Ser: 0.74 mg/dL (ref 0.50–1.10)
GFR calc Af Amer: >90 mL/min (ref >90)
GFR calc non Af Amer: >90 mL/min (ref >90)
Glucose, Bld: 323 mg/dL — ABNORMAL HIGH (ref 70–99)
Potassium: 3.6 mEq/L (ref 3.5–5.1)
Sodium: 130 mEq/L — ABNORMAL LOW (ref 135–145)

## 2013-09-10 LAB — KETONES, QUALITATIVE: Acetone, Bld: NEGATIVE

## 2013-09-10 LAB — TROPONIN I: Troponin I: <0.3 ng/mL (ref <0.30)

## 2013-09-10 LAB — HEMOGLOBIN A1C
Hgb A1c MFr Bld: 10.7 % — ABNORMAL HIGH (ref <5.7)
Mean Plasma Glucose: 260 mg/dL — ABNORMAL HIGH (ref <117)

## 2013-09-10 MED ORDER — OXYCODONE-ACETAMINOPHEN 5-325 MG PO TABS
1.0000 | ORAL_TABLET | Freq: Once | ORAL | Status: AC
  Administered 2013-09-10: 1 via ORAL
  Filled 2013-09-10: qty 1

## 2013-09-10 MED ORDER — DIPHENHYDRAMINE HCL 25 MG PO CAPS
25.0000 mg | ORAL_CAPSULE | Freq: Three times a day (TID) | ORAL | Status: DC | PRN
  Administered 2013-09-10 – 2013-09-11 (×3): 25 mg via ORAL
  Filled 2013-09-10 (×2): qty 1

## 2013-09-10 MED ORDER — INSULIN ASPART 100 UNIT/ML ~~LOC~~ SOLN
0.0000 [IU] | Freq: Three times a day (TID) | SUBCUTANEOUS | Status: DC
  Administered 2013-09-10: 1 [IU] via SUBCUTANEOUS
  Administered 2013-09-10: 2 [IU] via SUBCUTANEOUS
  Administered 2013-09-10 – 2013-09-11 (×2): 1 [IU] via SUBCUTANEOUS
  Administered 2013-09-11: 3 [IU] via SUBCUTANEOUS

## 2013-09-10 MED ORDER — AMLODIPINE BESYLATE 10 MG PO TABS
10.0000 mg | ORAL_TABLET | Freq: Every day | ORAL | Status: DC
Start: 2013-09-10 — End: 2013-09-10
  Administered 2013-09-10: 10 mg via ORAL
  Filled 2013-09-10: qty 1

## 2013-09-10 MED ORDER — INSULIN GLARGINE 100 UNIT/ML ~~LOC~~ SOLN
50.0000 [IU] | Freq: Two times a day (BID) | SUBCUTANEOUS | Status: DC
  Administered 2013-09-10 (×2): 50 [IU] via SUBCUTANEOUS
  Filled 2013-09-10 (×5): qty 0.5

## 2013-09-10 MED ORDER — INFLUENZA VAC SPLIT QUAD 0.5 ML IM SUSP
0.5000 mL | INTRAMUSCULAR | Status: AC
  Administered 2013-09-11: 0.5 mL via INTRAMUSCULAR
  Filled 2013-09-10 (×2): qty 0.5

## 2013-09-10 MED ORDER — ENOXAPARIN SODIUM 40 MG/0.4ML ~~LOC~~ SOLN
40.0000 mg | SUBCUTANEOUS | Status: DC
  Administered 2013-09-10 – 2013-09-11 (×2): 40 mg via SUBCUTANEOUS
  Filled 2013-09-10 (×2): qty 0.4

## 2013-09-10 MED ORDER — LISINOPRIL 40 MG PO TABS
40.0000 mg | ORAL_TABLET | Freq: Every day | ORAL | Status: DC
  Administered 2013-09-11: 40 mg via ORAL
  Filled 2013-09-10: qty 1

## 2013-09-10 MED ORDER — VENLAFAXINE HCL ER 75 MG PO CP24
75.0000 mg | ORAL_CAPSULE | Freq: Two times a day (BID) | ORAL | Status: DC
  Administered 2013-09-10 – 2013-09-11 (×3): 75 mg via ORAL
  Filled 2013-09-10 (×4): qty 1

## 2013-09-10 MED ORDER — SODIUM CHLORIDE 0.9 % IV SOLN
INTRAVENOUS | Status: AC
  Administered 2013-09-10: 05:00:00 via INTRAVENOUS

## 2013-09-10 MED ORDER — DIPHENHYDRAMINE HCL 50 MG/ML IJ SOLN
25.0000 mg | Freq: Once | INTRAMUSCULAR | Status: AC
  Administered 2013-09-10: 25 mg via INTRAVENOUS
  Filled 2013-09-10: qty 1

## 2013-09-10 MED ORDER — PANTOPRAZOLE SODIUM 40 MG PO TBEC
40.0000 mg | DELAYED_RELEASE_TABLET | Freq: Every day | ORAL | Status: DC
  Administered 2013-09-10: 40 mg via ORAL
  Filled 2013-09-10: qty 1

## 2013-09-10 MED ORDER — OXYCODONE-ACETAMINOPHEN 5-325 MG PO TABS
1.0000 | ORAL_TABLET | Freq: Four times a day (QID) | ORAL | Status: DC | PRN
  Administered 2013-09-10 (×2): 2 via ORAL
  Filled 2013-09-10 (×2): qty 2

## 2013-09-10 MED ORDER — QUETIAPINE FUMARATE ER 300 MG PO TB24
300.0000 mg | ORAL_TABLET | Freq: Every day | ORAL | Status: DC
  Administered 2013-09-10: 300 mg via ORAL
  Filled 2013-09-10 (×2): qty 1

## 2013-09-10 MED ORDER — ASPIRIN EC 81 MG PO TBEC
81.0000 mg | DELAYED_RELEASE_TABLET | Freq: Every day | ORAL | Status: DC
  Administered 2013-09-10 – 2013-09-11 (×2): 81 mg via ORAL
  Filled 2013-09-10 (×2): qty 1

## 2013-09-10 MED ORDER — PANTOPRAZOLE SODIUM 40 MG PO TBEC
40.0000 mg | DELAYED_RELEASE_TABLET | Freq: Two times a day (BID) | ORAL | Status: DC
  Administered 2013-09-10 – 2013-09-11 (×2): 40 mg via ORAL
  Filled 2013-09-10 (×3): qty 1

## 2013-09-10 MED ORDER — ERYTHROMYCIN BASE 250 MG PO TBEC
250.0000 mg | DELAYED_RELEASE_TABLET | Freq: Three times a day (TID) | ORAL | Status: DC
  Administered 2013-09-10 – 2013-09-11 (×3): 250 mg via ORAL
  Filled 2013-09-10 (×7): qty 1

## 2013-09-10 MED ORDER — DIPHENHYDRAMINE HCL 25 MG PO CAPS
ORAL_CAPSULE | ORAL | Status: AC
  Filled 2013-09-10: qty 1

## 2013-09-10 MED ORDER — FLUOXETINE HCL 10 MG PO CAPS
10.0000 mg | ORAL_CAPSULE | Freq: Every day | ORAL | Status: DC
  Administered 2013-09-10 – 2013-09-11 (×2): 10 mg via ORAL
  Filled 2013-09-10 (×2): qty 1

## 2013-09-10 NOTE — Progress Notes (Signed)
Inpatient Diabetes Program Recommendations  AACE/ADA: New Consensus Statement on Inpatient Glycemic Control (2013)  Target Ranges:  Prepandial:   less than 140 mg/dL      Peak postprandial:   less than 180 mg/dL (1-2 hours)      Critically ill patients:  140 - 180 mg/dL  Results for NIAJAH, SIPOS (MRN [id number]) as of 09/10/2013 08:52  Ref. Range 09/09/2013 22:19 09/09/2013 22:29 09/10/2013 01:35 09/10/2013 03:04 09/10/2013 04:29  Glucose-Capillary Latest Range: 70-99 mg/dL >403 (HH) 474 (HH) 259 (H) 287 (H) 273 (H)    Inpatient Diabetes Program Recommendations Insulin - Basal: No Lantus ordered-pt will need basal Patient was started on insulin gtt in the ED for glucose >600 and it has since been discontinued with no basal insulin given Thank you  Piedad Climes BSN, RN,CDE Inpatient Diabetes Coordinator [phone] (team pager)

## 2013-09-10 NOTE — H&P (Signed)
PCP:   Tilden Fossa, MD   Chief Complaint:  N/v/cp/epi pain  HPI: 42 yo female with several days of n/v nonbloody with epigastric pain that radiates up into her chest that waxes /wanes with uncontrolled sugar.  Pt states she normally does not have good glucose levels, but it is much higher than normal.  No diarrhea.  No sob.  No fevers.  No le edema or swelling.  Pain starts off in epigastric area/luq and sometimes also is in chest.  Review of Systems:  Positive and negative as per HPI otherwise all other systems are negative  Past Medical History: Past Medical History  Diagnosis Date  . Bipolar disorder diagnosed early 13s  . Depression   . Diabetes mellitus type II 2010  . High blood pressure   . Menopause    Past Surgical History  Procedure Laterality Date  . Abdominal hysterectomy  2007    Medications: Prior to Admission medications   Medication Sig Start Date End Date Taking? Authorizing Provider  amLODipine (NORVASC) 10 MG tablet Take 10 mg by mouth daily.   Yes Historical Provider, MD  diazepam (VALIUM) 10 MG tablet Take 10 mg by mouth every 8 (eight) hours as needed. For anxiety   Yes Karie Chimera, MD  FLUoxetine (PROZAC) 10 MG capsule Take 10 mg by mouth daily.   Yes Historical Provider, MD  insulin aspart (NOVOLOG) 100 UNIT/ML injection Inject 20 Units into the skin 3 (three) times daily before meals. Per sliding scale.   Yes Historical Provider, MD  insulin glargine (LANTUS) 100 UNIT/ML injection Inject 85 Units into the skin 2 (two) times daily.    Yes Historical Provider, MD  lisinopril-hydrochlorothiazide (PRINZIDE,ZESTORETIC) 20-25 MG per tablet Take 1 tablet by mouth daily.   Yes Historical Provider, MD  metFORMIN (GLUCOPHAGE) 850 MG tablet Take 850 mg by mouth 2 (two) times daily with a meal.   Yes Historical Provider, MD  omeprazole (PRILOSEC) 20 MG capsule Take 20 mg by mouth daily.   Yes Historical Provider, MD  QUEtiapine (SEROQUEL XR) 300 MG 24 hr  tablet Take 300 mg by mouth at bedtime.   Yes Historical Provider, MD  venlafaxine (EFFEXOR-XR) 75 MG 24 hr capsule Take 75 mg by mouth 2 (two) times daily.   Yes Historical Provider, MD    Allergies:   Allergies  Allergen Reactions  . Penicillins Hives  . Sulfa Antibiotics Hives    Social History:  reports that she has been smoking Cigarettes.  She has a 20 pack-year smoking history. She does not have any smokeless tobacco history on file. She reports that she does not drink alcohol or use illicit drugs.  Family History: Family History  Problem Relation Age of Onset  . Bipolar disorder Mother   . Bipolar disorder Son   . Depression Brother     schizoaffective d/o    Physical Exam: Filed Vitals:   09/09/13 2213 09/10/13 0139  BP: 142/98 141/89  Pulse: 113 94  Temp: 99.1 F (37.3 C)   Resp: 20 18  SpO2: 95% 90%   General appearance: alert, cooperative and no distress Head: Normocephalic, without obvious abnormality, atraumatic Eyes: negative Nose: Nares normal. Septum midline. Mucosa normal. No drainage or sinus tenderness. Neck: no JVD and supple, symmetrical, trachea midline Lungs: clear to auscultation bilaterally Heart: regular rate and rhythm, S1, S2 normal, no murmur, click, rub or gallop Abdomen: soft, non-tender; bowel sounds normal; no masses,  no organomegaly Extremities: extremities normal, atraumatic, no cyanosis or  edema Pulses: 2+ and symmetric Skin: Skin color, texture, turgor normal. No rashes or lesions Neurologic: Grossly normal    Labs on Admission:   Recent Labs  09/09/13 2242  NA 127*  K 3.7  CL 87*  CO2 23  GLUCOSE 588*  BUN 12  CREATININE 0.85  CALCIUM 9.9    Recent Labs  09/09/13 2242  AST 13  ALT 12  ALKPHOS 150*  BILITOT 0.3  PROT 8.3  ALBUMIN 3.8    Recent Labs  09/10/13 0210  LIPASE 25    Recent Labs  09/09/13 2242  WBC 11.2*  NEUTROABS 7.1  HGB 14.9  HCT 42.9  MCV 80.6  PLT 306    Recent Labs   09/10/13 0210  TROPONINI <0.30   Radiological Exams on Admission: Dg Chest Port 1 View  09/10/2013   *RADIOLOGY REPORT*  Clinical Data: Hyperglycemia.  Chest pressure  PORTABLE CHEST - 1 VIEW  Comparison: 12/07/2012  Findings: The heart size is normal.  The lungs are suboptimally inflated.  There is plate-like atelectasis noted in the lung bases. No pleural effusion or edema.  No airspace consolidation.  IMPRESSION:  1.  Low lung volumes with plate-like atelectasis.   Original Report Authenticated By: Signa Kell, M.D.    Assessment/Plan  42 yo female with HONK, chest pain, epigastric pain, n/v Principal Problem:   Hyperosmolar non-ketotic state in patient with type 2 diabetes mellitus Active Problems:   Bipolar disorder   High blood pressure   DM (diabetes mellitus)   Nausea & vomiting   Chest pain  Trop and ekg were requested, ekg is still pending at this time.  Will reorder again stat.  Lipase and lfts normal.  Romi.  Ck echo in am.  abd exam is benign.  Glucose has come down with insulin gtt in ED, will discontiue and place on ssi.  Obs.  Monitor bed.  Full code.  Jaloni Davoli A 09/10/2013, 3:09 AM

## 2013-09-10 NOTE — Progress Notes (Signed)
*  PRELIMINARY RESULTS* Echocardiogram 2D Echocardiogram has been performed.  Stephanie Thornton 09/10/2013, 10:29 AM

## 2013-09-10 NOTE — Progress Notes (Signed)
[facility] PROGRESS NOTE  Stephanie Thornton ZOX:[id number] DOB: [birth year] DOA: 09/09/2013 PCP: Tilden Fossa, MD  Assessment/Plan: 1-Hyperosmolar non-ketotic state in patient with type 2 diabetes mellitus: resolved now after IVF's and IV insulin -patient with uncontrolled diabetes and has admitted non-compliance with medications -continue SSI and lantus -A1C 10.7  2-Diabetes mellitus type 2: uncontrolled. Advised to be compliant with medications and low carb diet. -currently on lantus 50 units BID and SSI CBG's well controlled  3-Chest pain: non cardiac; most likely due to GERD and gastroparesis. -CE'z neg -EKG w/o acute ischemic changes and no abnormalities on telemetry -2-D echo pending  4-GERD: will continue PPI  5-gastroparesis: patient symptoms descriptions and uncontrolled diabetes suggested gastroparesis. -will use PPI -minimize narcotics -start erythromycin; will avoid reglan given use of seroquel and effexor and potentiality for QT prolongation -controlled diabetes and follow small multiple meals to facilitate digestion and food transit   6-Bipolar disorder:will continue home regimen (effexor, seroquel and prozac)  7-High blood pressure:stable and well controlled. Will continue heart healthy diet and change amlodipine for lisinopril. Will provide kidney protection and calcium channel blocker has been associated in worsening of gastroparesis.  DVT: lovenox  Code Status: Full Family Communication: husband at bedside Disposition Plan: home when medically stable   Consultants:  None   Procedures:  See below for x-ray reports  2-D echo (pending)  Antibiotics:  Erythromycin 8/12 (to help with gastroparesis)  HPI/Subjective: Feeling better, denies CP or SOB; still with some nausea, dyspepsia and feeling that her food stay longer in her stomach.  Objective: Filed Vitals:   09/10/13 1439  BP: 126/86  Pulse: 86  Temp: 98.1 F (36.7 C)  Resp: 18     Intake/Output Summary (Last 24 hours) at 09/10/13 1859 Last data filed at 09/10/13 1126  Gross per 24 hour  Intake      0 ml  Output      1 ml  Net     -1 ml   Filed Weights   09/10/13 0429  Weight: 79.8 kg (175 lb 14.8 oz)    Exam:   General:  Feeling better, NAD; denies CP or SOB; still with some nausea  Cardiovascular: S1 and S2, no rubs or gallops; RRR  Respiratory: CTA bilaterally  Abdomen: soft, D; mild tenderness with deep palpation on her epigastric area; positive BS  Musculoskeletal: no swelling, no erythema, no cyanosis  Data Reviewed: Basic Metabolic Panel:  Recent Labs Lab 09/09/13 2242 09/10/13 0210  NA 127* 130*  K 3.7 3.6  CL 87* 90*  CO2 23 23  GLUCOSE 588* 323*  BUN 12 11  CREATININE 0.85 0.74  CALCIUM 9.9 9.4   Liver Function Tests:  Recent Labs Lab 09/09/13 2242  AST 13  ALT 12  ALKPHOS 150*  BILITOT 0.3  PROT 8.3  ALBUMIN 3.8    Recent Labs Lab 09/10/13 0210  LIPASE 25   CBC:  Recent Labs Lab 09/09/13 2242 09/10/13 0210  WBC 11.2* 12.8*  NEUTROABS 7.1  --   HGB 14.9 14.6  HCT 42.9 42.5  MCV 80.6 81.0  PLT 306 282   Cardiac Enzymes:  Recent Labs Lab 09/10/13 0210  TROPONINI <0.30   CBG:  Recent Labs Lab 09/10/13 0304 09/10/13 0429 09/10/13 0742 09/10/13 1131 09/10/13 1651  GLUCAP 287* 273* 143* 166* 139*    Studies: Dg Chest Port 1 View  09/10/2013   *RADIOLOGY REPORT*  Clinical Data: Hyperglycemia.  Chest pressure  PORTABLE CHEST - 1 VIEW  Comparison:  12/07/2012  Findings: The heart size is normal.  The lungs are suboptimally inflated.  There is plate-like atelectasis noted in the lung bases. No pleural effusion or edema.  No airspace consolidation.  IMPRESSION:  1.  Low lung volumes with plate-like atelectasis.   Original Report Authenticated By: Signa Kell, M.D.    Scheduled Meds: . amLODipine  10 mg Oral Daily  . aspirin EC  81 mg Oral Daily  . enoxaparin (LOVENOX) injection  40 mg  Subcutaneous Q24H  . FLUoxetine  10 mg Oral Daily  . [START ON 09/11/2013] influenza vac split quadrivalent PF  0.5 mL Intramuscular Tomorrow-1000  . insulin aspart  0-9 Units Subcutaneous TID WC  . insulin glargine  50 Units Subcutaneous BID  . pantoprazole  40 mg Oral Q1200  . QUEtiapine  300 mg Oral QHS  . venlafaxine XR  75 mg Oral BID     Time spent: > 30 minutes    Luc Shammas  [facility] Pager [phone]. If 7PM-7AM, please contact night-coverage at [email or web address], password [facility] 09/10/2013, 6:59 PM  LOS: 1 day

## 2013-09-11 DIAGNOSIS — I10 Essential (primary) hypertension: Secondary | ICD-10-CM

## 2013-09-11 DIAGNOSIS — R079 Chest pain, unspecified: Secondary | ICD-10-CM

## 2013-09-11 DIAGNOSIS — R7309 Other abnormal glucose: Secondary | ICD-10-CM

## 2013-09-11 DIAGNOSIS — E1101 Type 2 diabetes mellitus with hyperosmolarity with coma: Secondary | ICD-10-CM

## 2013-09-11 DIAGNOSIS — K219 Gastro-esophageal reflux disease without esophagitis: Secondary | ICD-10-CM

## 2013-09-11 DIAGNOSIS — K3184 Gastroparesis: Secondary | ICD-10-CM

## 2013-09-11 DIAGNOSIS — F319 Bipolar disorder, unspecified: Secondary | ICD-10-CM

## 2013-09-11 DIAGNOSIS — E119 Type 2 diabetes mellitus without complications: Secondary | ICD-10-CM

## 2013-09-11 DIAGNOSIS — R112 Nausea with vomiting, unspecified: Secondary | ICD-10-CM

## 2013-09-11 LAB — GLUCOSE, CAPILLARY
Glucose-Capillary: 150 mg/dL — ABNORMAL HIGH (ref 70–99)
Glucose-Capillary: 216 mg/dL — ABNORMAL HIGH (ref 70–99)

## 2013-09-11 MED ORDER — OMEPRAZOLE 40 MG PO CPDR: 40.0000 mg | DELAYED_RELEASE_CAPSULE | Freq: Two times a day (BID) | ORAL | Status: DC

## 2013-09-11 MED ORDER — HYDROCHLOROTHIAZIDE 25 MG PO TABS: 25.0000 mg | ORAL_TABLET | Freq: Every day | ORAL | Status: DC

## 2013-09-11 MED ORDER — INSULIN GLARGINE 100 UNIT/ML ~~LOC~~ SOLN: 90.0000 [IU] | Freq: Two times a day (BID) | SUBCUTANEOUS | Status: DC

## 2013-09-11 MED ORDER — ERYTHROMYCIN BASE 250 MG PO TBEC: 250.0000 mg | DELAYED_RELEASE_TABLET | Freq: Three times a day (TID) | ORAL | Status: DC

## 2013-09-11 MED ORDER — INSULIN GLARGINE 100 UNIT/ML ~~LOC~~ SOLN
70.0000 [IU] | Freq: Two times a day (BID) | SUBCUTANEOUS | Status: DC
  Administered 2013-09-11: 70 [IU] via SUBCUTANEOUS
  Filled 2013-09-11 (×2): qty 0.7

## 2013-09-11 MED ORDER — LISINOPRIL 40 MG PO TABS: 40.0000 mg | ORAL_TABLET | Freq: Every day | ORAL | Status: DC

## 2013-09-11 NOTE — Discharge Summary (Signed)
Physician Discharge Summary  Stephanie Thornton:[id number] DOB: 17-May-1971 DOA: 09/09/2013  PCP: Tilden Fossa, MD  Admit date: 09/09/2013 Discharge date: 09/11/2013  Time spent: >30 minutes  Recommendations for Outpatient Follow-up:  -Close follow up of his CBG's and further adjustments to hypoglycemic agents -BMET to follow electrolytes and kidney function -reassess BP and adjust medications as needed  Discharge Diagnoses:  Principal Problem:   Hyperosmolar non-ketotic state in patient with type 2 diabetes mellitus Active Problems:   Bipolar disorder   High blood pressure   DM (diabetes mellitus)   Nausea & vomiting   Chest pain  Discharge Condition: stable and improved. Patient no complaining or CP, nausea, vomiting or abdominal pain. Will discharge home with instructions for medication compliance and to follow with PCP in 10 days.  Diet recommendation: heart healthy diet and low carbohydrates  Filed Weights   09/10/13 0429  Weight: 79.8 kg (175 lb 14.8 oz)   History of present illness:  42 yo female with several days of n/v nonbloody with epigastric pain that radiates up into her chest that waxes /wanes with uncontrolled sugar. Pt states she normally does not have good glucose levels, but it is much higher than normal. No diarrhea. No sob. No fevers. No le edema or swelling. Pain starts off in epigastric area/luq and sometimes also is in chest.  Hospital Course:  1-Hyperosmolar non-ketotic state in patient with type 2 diabetes mellitus: resolved now after IVF's and IV insulin  -patient with uncontrolled diabetes and has admitted some  non-compliance with medications  -continue SSI and lantus -adjustments doen to her hypoglycemic agents given CBG's levels and A1C for better control and patient advised to follow with PCP for further adjustment  -A1C 10.7   2-Diabetes mellitus type 2: uncontrolled. Advised to be compliant with medications and low carb diet.  -will  discharge on Lantus 90 units BID, resume metformin and SSI  -patient to follow with PCP for further adjustment to hypoglycemic agents.   3-Chest pain: non cardiac; most likely due to GERD and gastroparesis.  -CE'z neg  -EKG w/o acute ischemic changes and no abnormalities on telemetry  -2-D echo (no wall motion abnormalities, preserved EF at 60-65%; no significant valvular abnormalities) -continue PPI  4-GERD: will continue PPI   5-Gastroparesis: patient symptoms descriptions and uncontrolled diabetes suggested gastroparesis.  -will continue use of PPI  -minimize narcotics as an outpatient -will start erythromycin; will avoid reglan given use of seroquel and effexor and potentiality for QT prolongation  -controlled diabetes and follow small multiple meals to facilitate digestion and food transit.  6-Bipolar disorder: will continue home regimen (effexor, seroquel and prozac)   7-High blood pressure: stable and well controlled. Will continue heart healthy diet and change amlodipine for lisinopril. Will provide kidney protection and calcium channel blocker has been associated in worsening of gastroparesis.   Procedures: See below for x-ray reports  Consultations:  None  Discharge Exam: Filed Vitals:   09/11/13 0633  BP: 147/74  Pulse: 103  Temp: 98.6 F (37 C)  Resp: 18   General: afebrile, NAD; denies CP or SOB; abd pain and N/V Cardiovascular: S1 and S2, no rubs or gallops; RRR  Respiratory: CTA bilaterally  Abdomen: soft, ND; + BS; no tenderness Musculoskeletal: no swelling, no erythema, no cyanosis SKIN: multiple tattoos, no open wounds, no rash or petechiae  Discharge Instructions  Discharge Orders   Future Orders Complete By Expires   Discharge instructions  As directed    Comments:  Be compliant with medications Follow a low carbohydrates and low sodium diet Take medications as prescribed Keep yourself well hydrated Arrange follow up with PCP in 10 days        Medication List    STOP taking these medications       amLODipine 10 MG tablet  Commonly known as:  NORVASC     lisinopril-hydrochlorothiazide 20-25 MG per tablet  Commonly known as:  PRINZIDE,ZESTORETIC      TAKE these medications       diazepam 10 MG tablet  Commonly known as:  VALIUM  Take 10 mg by mouth every 8 (eight) hours as needed. For anxiety     erythromycin 250 MG EC tablet  Commonly known as:  ERY-TAB  Take 1 tablet (250 mg total) by mouth 4 (four) times daily -  with meals and at bedtime.     FLUoxetine 10 MG capsule  Commonly known as:  PROZAC  Take 10 mg by mouth daily.     hydrochlorothiazide 25 MG tablet  Commonly known as:  HYDRODIURIL  Take 1 tablet (25 mg total) by mouth daily.     insulin aspart 100 UNIT/ML injection  Commonly known as:  novoLOG  Inject 20 Units into the skin 3 (three) times daily before meals. Per sliding scale.     insulin glargine 100 UNIT/ML injection  Commonly known as:  LANTUS  Inject 0.9 mLs (90 Units total) into the skin 2 (two) times daily.     lisinopril 40 MG tablet  Commonly known as:  PRINIVIL,ZESTRIL  Take 1 tablet (40 mg total) by mouth daily.     metFORMIN 850 MG tablet  Commonly known as:  GLUCOPHAGE  Take 850 mg by mouth 2 (two) times daily with a meal.     omeprazole 40 MG capsule  Commonly known as:  PRILOSEC  Take 1 capsule (40 mg total) by mouth 2 (two) times daily.     QUEtiapine 300 MG 24 hr tablet  Commonly known as:  SEROQUEL XR  Take 300 mg by mouth at bedtime.     venlafaxine XR 75 MG 24 hr capsule  Commonly known as:  EFFEXOR-XR  Take 75 mg by mouth 2 (two) times daily.       Allergies  Allergen Reactions  . Penicillins Hives  . Sulfa Antibiotics Hives       Follow-up Information   Follow up with REES, ELIZABETH, MD. Schedule an appointment as soon as possible for a visit in 10 days.   Specialty:  Emergency Medicine   Contact information:   [address] [place] [address] [phone]       The results of significant diagnostics from this hospitalization (including imaging, microbiology, ancillary and laboratory) are listed below for reference.    Significant Diagnostic Studies: Dg Chest Port 1 View  09/10/2013   *RADIOLOGY REPORT*  Clinical Data: Hyperglycemia.  Chest pressure  PORTABLE CHEST - 1 VIEW  Comparison: 12/07/2012  Findings: The heart size is normal.  The lungs are suboptimally inflated.  There is plate-like atelectasis noted in the lung bases. No pleural effusion or edema.  No airspace consolidation.  IMPRESSION:  1.  Low lung volumes with plate-like atelectasis.   Original Report Authenticated By: Signa Kell, M.D.   Labs: Basic Metabolic Panel:  Recent Labs Lab 09/09/13 2242 09/10/13 0210  NA 127* 130*  K 3.7 3.6  CL 87* 90*  CO2 23 23  GLUCOSE 588* 323*  BUN 12 11  CREATININE 0.85 0.74  CALCIUM 9.9 9.4   Liver Function Tests:  Recent Labs Lab 09/09/13 2242  AST 13  ALT 12  ALKPHOS 150*  BILITOT 0.3  PROT 8.3  ALBUMIN 3.8    Recent Labs Lab 09/10/13 0210  LIPASE 25   CBC:  Recent Labs Lab 09/09/13 2242 09/10/13 0210  WBC 11.2* 12.8*  NEUTROABS 7.1  --   HGB 14.9 14.6  HCT 42.9 42.5  MCV 80.6 81.0  PLT 306 282   Cardiac Enzymes:  Recent Labs Lab 09/10/13 0210  TROPONINI <0.30   CBG:  Recent Labs Lab 09/10/13 1131 09/10/13 1651 09/10/13 2106 09/11/13 0806 09/11/13 1136  GLUCAP 166* 139* 171* 150* 216*    Signed:  Kenda Kloehn  [facility] 09/11/2013, 2:47 PM

## 2013-09-14 NOTE — ED Provider Notes (Signed)
Medical screening examination/treatment/procedure(s) were performed by non-physician practitioner and as supervising physician I was immediately available for consultation/collaboration.  Arleigh Dicola R. Winifred Bodiford, MD 09/14/13 0703 

## 2013-11-02 ENCOUNTER — Encounter (HOSPITAL_COMMUNITY): Payer: Self-pay | Admitting: Emergency Medicine

## 2013-11-02 ENCOUNTER — Emergency Department (INDEPENDENT_AMBULATORY_CARE_PROVIDER_SITE_OTHER)
Admission: EM | Admit: 2013-11-02 | Discharge: 2013-11-02 | Disposition: A | Discharge status: Home / Self Care | Payer: Medicare Other | Source: Home / Self Care | Attending: Family Medicine | Admitting: Family Medicine

## 2013-11-02 DIAGNOSIS — J020 Streptococcal pharyngitis: Secondary | ICD-10-CM

## 2013-11-02 LAB — POCT RAPID STREP A: Streptococcus, Group A Screen (Direct): POSITIVE — AB

## 2013-11-02 MED ORDER — CEPHALEXIN 500 MG PO CAPS: 500.0000 mg | ORAL_CAPSULE | Freq: Four times a day (QID) | ORAL | Status: DC

## 2013-11-02 MED ORDER — ACETAMINOPHEN 325 MG PO TABS
ORAL_TABLET | ORAL | Status: AC
  Filled 2013-11-02: qty 3

## 2013-11-02 MED ORDER — HYDROCODONE-ACETAMINOPHEN 5-325 MG PO TABS: 2.0000 | ORAL_TABLET | ORAL | Status: DC | PRN

## 2013-11-02 NOTE — ED Notes (Signed)
C/o of sore throat, swollen tonsils and ear pain since yesterday morning. And fever.  States not able to swallow.  Denies n/v/d

## 2013-11-02 NOTE — ED Provider Notes (Signed)
Medical screening examination/treatment/procedure(s) were performed by resident physician or non-physician practitioner and as supervising physician I was immediately available for consultation/collaboration.   Norvil Martensen DOUGLAS MD.   Munachimso Rigdon D Chesnee Floren, MD 11/02/13 1512 

## 2013-11-02 NOTE — ED Provider Notes (Signed)
CSN: [id number]     Arrival date & time 11/02/13  1309 History   First MD Initiated Contact with Patient 11/02/13 1354     Chief Complaint  Patient presents with  . Sore Throat   (Consider location/radiation/quality/duration/timing/severity/associated sxs/prior Treatment) Patient is a 42 y.o. female presenting with pharyngitis. The history is provided by the patient. No language interpreter was used.  Sore Throat This is a new problem. The current episode started 2 days ago. The problem occurs constantly. The problem has been gradually worsening. Nothing aggravates the symptoms. Nothing relieves the symptoms. She has tried nothing for the symptoms.    Past Medical History  Diagnosis Date  . Bipolar disorder diagnosed early 78s  . Depression   . Diabetes mellitus type II 2010  . High blood pressure   . Menopause    Past Surgical History  Procedure Laterality Date  . Abdominal hysterectomy  2007   Family History  Problem Relation Age of Onset  . Bipolar disorder Mother   . Bipolar disorder Son   . Depression Brother     schizoaffective d/o   History  Substance Use Topics  . Smoking status: Current Every Day Smoker -- 1.00 packs/day for 20 years    Types: Cigarettes  . Smokeless tobacco: Never Used  . Alcohol Use: No   OB History   Grav Para Term Preterm Abortions TAB SAB Ect Mult Living                 Review of Systems  HENT: Positive for sore throat.   All other systems reviewed and are negative.    Allergies  Penicillins and Sulfa antibiotics  Home Medications   Current Outpatient Rx  Name  Route  Sig  Dispense  Refill  . diazepam (VALIUM) 10 MG tablet   Oral   Take 10 mg by mouth every 8 (eight) hours as needed. For anxiety         . erythromycin (ERY-TAB) 250 MG EC tablet   Oral   Take 1 tablet (250 mg total) by mouth 4 (four) times daily -  with meals and at bedtime.   120 tablet   1   . FLUoxetine (PROZAC) 10 MG capsule   Oral   Take 10 mg  by mouth daily.         . hydrochlorothiazide (HYDRODIURIL) 25 MG tablet   Oral   Take 1 tablet (25 mg total) by mouth daily.   30 tablet   1   . insulin aspart (NOVOLOG) 100 UNIT/ML injection   Subcutaneous   Inject 20 Units into the skin 3 (three) times daily before meals. Per sliding scale.         . insulin glargine (LANTUS) 100 UNIT/ML injection   Subcutaneous   Inject 0.9 mLs (90 Units total) into the skin 2 (two) times daily.   10 mL   12   . lisinopril (PRINIVIL,ZESTRIL) 40 MG tablet   Oral   Take 1 tablet (40 mg total) by mouth daily.   30 tablet   1   . metFORMIN (GLUCOPHAGE) 850 MG tablet   Oral   Take 850 mg by mouth 2 (two) times daily with a meal.         . omeprazole (PRILOSEC) 40 MG capsule   Oral   Take 1 capsule (40 mg total) by mouth 2 (two) times daily.   60 capsule   1   . QUEtiapine (SEROQUEL XR) 300 MG 24 hr  tablet   Oral   Take 300 mg by mouth at bedtime.         Marland Kitchen venlafaxine (EFFEXOR-XR) 75 MG 24 hr capsule   Oral   Take 75 mg by mouth 2 (two) times daily.          BP 135/85  Pulse 130  Temp(Src) 102.5 F (39.2 C) (Oral)  Resp 22  SpO2 100% Physical Exam  Nursing note and vitals reviewed. Constitutional: She appears well-developed and well-nourished.  HENT:  Head: Normocephalic and atraumatic.  Nose: Nose normal.  Erythema throat  Eyes: Pupils are equal, round, and reactive to light.  Neck: Normal range of motion. Neck supple.  Cardiovascular: Normal rate.   Pulmonary/Chest: Effort normal.  Abdominal: Soft.  Musculoskeletal: Normal range of motion.  Neurological: She is alert.  Skin: Skin is warm.    ED Course  Procedures (including critical care time) Labs Review Labs Reviewed  POCT RAPID STREP A ([facility]RG CARE ONLY) - Abnormal; Notable for the following:    Streptococcus, Group A Screen (Direct) POSITIVE (*)    All other components within normal limits   Imaging Review No results found.  EKG  Interpretation     Ventricular Rate:    PR Interval:    QRS Duration:   QT Interval:    QTC Calculation:   R Axis:     Text Interpretation:              MDM  No diagnosis found.  Strep positive Keflex and hydrocodone    Elson Areas, PA-C 11/02/13 1440

## 2013-11-02 NOTE — ED Notes (Signed)
Pt give 975 mg tylenol for fever.  Mw,cma.

## 2014-01-09 ENCOUNTER — Encounter (HOSPITAL_COMMUNITY): Payer: Self-pay | Admitting: Emergency Medicine

## 2014-01-09 ENCOUNTER — Emergency Department (HOSPITAL_COMMUNITY): Payer: Medicare Other

## 2014-01-09 ENCOUNTER — Emergency Department (HOSPITAL_COMMUNITY)
Admission: EM | Admit: 2014-01-09 | Discharge: 2014-01-10 | Disposition: A | Discharge status: Home / Self Care | Payer: Medicare Other | Attending: Emergency Medicine | Admitting: Emergency Medicine

## 2014-01-09 DIAGNOSIS — R5381 Other malaise: Secondary | ICD-10-CM | POA: Insufficient documentation

## 2014-01-09 DIAGNOSIS — R05 Cough: Secondary | ICD-10-CM | POA: Insufficient documentation

## 2014-01-09 DIAGNOSIS — R5383 Other fatigue: Secondary | ICD-10-CM

## 2014-01-09 DIAGNOSIS — R059 Cough, unspecified: Secondary | ICD-10-CM | POA: Insufficient documentation

## 2014-01-09 DIAGNOSIS — F3289 Other specified depressive episodes: Secondary | ICD-10-CM | POA: Insufficient documentation

## 2014-01-09 DIAGNOSIS — E119 Type 2 diabetes mellitus without complications: Secondary | ICD-10-CM

## 2014-01-09 DIAGNOSIS — J3489 Other specified disorders of nose and nasal sinuses: Secondary | ICD-10-CM | POA: Insufficient documentation

## 2014-01-09 DIAGNOSIS — F17200 Nicotine dependence, unspecified, uncomplicated: Secondary | ICD-10-CM | POA: Insufficient documentation

## 2014-01-09 DIAGNOSIS — R197 Diarrhea, unspecified: Secondary | ICD-10-CM | POA: Insufficient documentation

## 2014-01-09 DIAGNOSIS — R739 Hyperglycemia, unspecified: Secondary | ICD-10-CM

## 2014-01-09 DIAGNOSIS — Z880 Allergy status to penicillin: Secondary | ICD-10-CM | POA: Insufficient documentation

## 2014-01-09 DIAGNOSIS — Z79899 Other long term (current) drug therapy: Secondary | ICD-10-CM | POA: Insufficient documentation

## 2014-01-09 DIAGNOSIS — Z794 Long term (current) use of insulin: Secondary | ICD-10-CM | POA: Insufficient documentation

## 2014-01-09 DIAGNOSIS — F329 Major depressive disorder, single episode, unspecified: Secondary | ICD-10-CM | POA: Insufficient documentation

## 2014-01-09 DIAGNOSIS — Z8742 Personal history of other diseases of the female genital tract: Secondary | ICD-10-CM | POA: Insufficient documentation

## 2014-01-09 DIAGNOSIS — I10 Essential (primary) hypertension: Secondary | ICD-10-CM

## 2014-01-09 DIAGNOSIS — R112 Nausea with vomiting, unspecified: Secondary | ICD-10-CM

## 2014-01-09 DIAGNOSIS — N390 Urinary tract infection, site not specified: Secondary | ICD-10-CM | POA: Insufficient documentation

## 2014-01-09 LAB — CBC
HCT: 37.1 % (ref 36.0–46.0)
Hemoglobin: 12.3 g/dL (ref 12.0–15.0)
MCH: 27 pg (ref 26.0–34.0)
MCHC: 33.2 g/dL (ref 30.0–36.0)
MCV: 81.5 fL (ref 78.0–100.0)
Platelets: 184 10*3/uL (ref 150–400)
RBC: 4.55 MIL/uL (ref 3.87–5.11)
RDW: 15.6 % — ABNORMAL HIGH (ref 11.5–15.5)
WBC: 10.2 10*3/uL (ref 4.0–10.5)

## 2014-01-09 LAB — URINE MICROSCOPIC-ADD ON

## 2014-01-09 LAB — URINALYSIS, ROUTINE W REFLEX MICROSCOPIC
Bilirubin Urine: NEGATIVE
Glucose, UA: >1000 mg/dL — AB
Ketones, ur: NEGATIVE mg/dL
Nitrite: POSITIVE — AB
Protein, ur: NEGATIVE mg/dL
Specific Gravity, Urine: 1.038 — ABNORMAL HIGH (ref 1.005–1.030)
Urobilinogen, UA: 0.2 mg/dL (ref 0.0–1.0)
pH: 6 (ref 5.0–8.0)

## 2014-01-09 LAB — GLUCOSE, CAPILLARY: Glucose-Capillary: 416 mg/dL — ABNORMAL HIGH (ref 70–99)

## 2014-01-09 MED ORDER — SODIUM CHLORIDE 0.9 % IV SOLN
1000.0000 mL | Freq: Once | INTRAVENOUS | Status: AC
  Administered 2014-01-09: 1000 mL via INTRAVENOUS

## 2014-01-09 MED ORDER — DEXTROSE 5 % IV SOLN
1.0000 g | Freq: Once | INTRAVENOUS | Status: AC
  Administered 2014-01-10: 1 g via INTRAVENOUS
  Filled 2014-01-09: qty 10

## 2014-01-09 MED ORDER — SODIUM CHLORIDE 0.9 % IV SOLN
1000.0000 mL | INTRAVENOUS | Status: DC
  Administered 2014-01-10: 1000 mL via INTRAVENOUS

## 2014-01-09 NOTE — ED Notes (Signed)
Pt BIB EMS. Pt states her blood sugar was >600 at home. Pt took Novalog 200 U at about 2000. Pt states she was dizzy and nauseous earlier. Pt also reports she has had dysuria for one week. Pt alert with no acute distress.

## 2014-01-09 NOTE — ED Provider Notes (Signed)
CSN: [id number]     Arrival date & time 01/09/14  2200 History   First MD Initiated Contact with Patient 01/09/14 2223     Chief Complaint  Patient presents with  . Hyperglycemia  . Dizziness   (Consider location/radiation/quality/duration/timing/severity/associated sxs/prior Treatment) The history is provided by the patient and medical records. No language interpreter was used.    Stephanie Thornton is a 43 y.o. female  with a hx of bipolar disorder, depression, insulin-dependent diabetes, hypertension, abdominal hysterectomy in 2007 presents to the Emergency Department complaining of gradual, persistent, progressively worsening hyperglycemia with associated dizziness and nausea beginning yesterday. Patient also complains of dysuria for one week with generalized malaise.  She reports a turn for the worse yesterday with her malaise, fatigue.  She reports she is taking her insulin as directed, but her blood sugars have been high all week in spite of this.  Pt has not tried any OTC medications.  Nothing makes it better or worse.  Pt reports associated "foggy head," nausea and several episodes of vomiting, diarrhea, weakness, dizziness, lightheadedness.   Pt denies fever, chills, headache, neck pain, chest pain, SOB.     Past Medical History  Diagnosis Date  . Bipolar disorder diagnosed early 29s  . Depression   . Diabetes mellitus type II 2010  . High blood pressure   . Menopause    Past Surgical History  Procedure Laterality Date  . Abdominal hysterectomy  2007   Family History  Problem Relation Age of Onset  . Bipolar disorder Mother   . Bipolar disorder Son   . Depression Brother     schizoaffective d/o   History  Substance Use Topics  . Smoking status: Current Every Day Smoker -- 1.00 packs/day for 20 years    Types: Cigarettes  . Smokeless tobacco: Never Used  . Alcohol Use: No   OB History   Grav Para Term Preterm Abortions TAB SAB Ect Mult Living                  Review of Systems  Constitutional: Positive for fatigue. Negative for fever, diaphoresis, appetite change and unexpected weight change.  HENT: Positive for postnasal drip, rhinorrhea and sinus pressure. Negative for mouth sores.   Eyes: Negative for visual disturbance.  Respiratory: Positive for cough. Negative for chest tightness, shortness of breath and wheezing.   Cardiovascular: Negative for chest pain.  Gastrointestinal: Positive for nausea, vomiting, abdominal pain and diarrhea. Negative for constipation.  Endocrine: Negative for polydipsia, polyphagia and polyuria.  Genitourinary: Positive for dysuria, urgency and frequency. Negative for hematuria.  Musculoskeletal: Negative for back pain and neck stiffness.  Skin: Negative for rash.  Allergic/Immunologic: Negative for immunocompromised state.  Neurological: Positive for dizziness, weakness and light-headedness. Negative for syncope and headaches.  Hematological: Does not bruise/bleed easily.  Psychiatric/Behavioral: Negative for sleep disturbance. The patient is not nervous/anxious.     Allergies  Penicillins and Sulfa antibiotics  Home Medications   Current Outpatient Rx  Name  Route  Sig  Dispense  Refill  . diazepam (VALIUM) 10 MG tablet   Oral   Take 10 mg by mouth every 8 (eight) hours as needed for anxiety. For anxiety         . FLUoxetine (PROZAC) 10 MG capsule   Oral   Take 10 mg by mouth every morning.          . hydrochlorothiazide (HYDRODIURIL) 25 MG tablet   Oral   Take 1 tablet (25 mg total)  by mouth daily.   30 tablet   1   . insulin aspart (NOVOLOG) 100 UNIT/ML injection   Subcutaneous   Inject 10-20 Units into the skin 3 (three) times daily before meals. Per sliding scale.         . insulin glargine (LANTUS) 100 UNIT/ML injection   Subcutaneous   Inject 0.9 mLs (90 Units total) into the skin 2 (two) times daily.   10 mL   12   . lisinopril (PRINIVIL,ZESTRIL) 40 MG tablet   Oral    Take 1 tablet (40 mg total) by mouth daily.   30 tablet   1   . metFORMIN (GLUCOPHAGE) 850 MG tablet   Oral   Take 850 mg by mouth 2 (two) times daily with a meal.         . omeprazole (PRILOSEC) 40 MG capsule   Oral   Take 1 capsule (40 mg total) by mouth 2 (two) times daily.   60 capsule   1   . QUEtiapine (SEROQUEL XR) 300 MG 24 hr tablet   Oral   Take 300 mg by mouth at bedtime.         . cephALEXin (KEFLEX) 500 MG capsule   Oral   Take 1 capsule (500 mg total) by mouth 4 (four) times daily.   40 capsule   0   . ondansetron (ZOFRAN ODT) 8 MG disintegrating tablet      8mg  ODT q4 hours prn nausea   4 tablet   0    BP 158/119  Pulse 93  Temp(Src) 98.5 F (36.9 C) (Oral)  Resp 17  SpO2 96% Physical Exam  Nursing note and vitals reviewed. Constitutional: She is oriented to person, place, and time. She appears well-developed and well-nourished.  HENT:  Head: Normocephalic and atraumatic.  Mouth/Throat: Oropharynx is clear and moist.  Eyes: Conjunctivae are normal. No scleral icterus.  Neck: Normal range of motion.  Cardiovascular: Normal rate, regular rhythm, normal heart sounds and intact distal pulses.   No murmur heard. Pulmonary/Chest: Effort normal and breath sounds normal. No respiratory distress. She has no wheezes.  Clear and equal breath sounds  Abdominal: Soft. Bowel sounds are normal. She exhibits no distension and no mass. There is tenderness in the suprapubic area. There is guarding. There is no rebound and no CVA tenderness.  Suprapubic abdominal tenderness with mild guarding No CVA tenderness  Musculoskeletal: Normal range of motion. She exhibits no tenderness.  Lymphadenopathy:    She has no cervical adenopathy.  Neurological: She is alert and oriented to person, place, and time. No cranial nerve deficit.  Skin: Skin is warm and dry. No erythema.  Psychiatric: She has a normal mood and affect.    ED Course  Procedures (including critical  care time) Labs Review Labs Reviewed  GLUCOSE, CAPILLARY - Abnormal; Notable for the following:    Glucose-Capillary 416 (*)    All other components within normal limits  URINALYSIS, ROUTINE W REFLEX MICROSCOPIC - Abnormal; Notable for the following:    APPearance CLOUDY (*)    Specific Gravity, Urine 1.038 (*)    Glucose, UA >1000 (*)    Hgb urine dipstick LARGE (*)    Nitrite POSITIVE (*)    Leukocytes, UA SMALL (*)    All other components within normal limits  CBC - Abnormal; Notable for the following:    RDW 15.6 (*)    All other components within normal limits  COMPREHENSIVE METABOLIC PANEL - Abnormal; Notable for  the following:    Sodium 136 (*)    Glucose, Bld 348 (*)    Calcium 8.3 (*)    Albumin 3.0 (*)    Total Bilirubin <0.2 (*)    All other components within normal limits  URINE MICROSCOPIC-ADD ON - Abnormal; Notable for the following:    Squamous Epithelial / LPF FEW (*)    Bacteria, UA MANY (*)    All other components within normal limits  GLUCOSE, CAPILLARY - Abnormal; Notable for the following:    Glucose-Capillary 301 (*)    All other components within normal limits  URINE CULTURE  LIPASE, BLOOD   Imaging Review Dg Chest 2 View  01/09/2014   CLINICAL DATA:  Cough.  EXAM: CHEST  2 VIEW  COMPARISON:  09/10/2013  FINDINGS: Mild chronic interstitial coarsening which is changes. No edema or consolidation. Normal heart size. No effusion or pneumothorax.  IMPRESSION: No active cardiopulmonary disease.   Electronically Signed   By: Tiburcio PeaJonathan  Watts M.D.   On: 01/09/2014 23:21    EKG Interpretation   None       MDM   1. UTI (lower urinary tract infection)   2. Hyperglycemia   3. High blood pressure   4. DM (diabetes mellitus)   5. Nausea & vomiting      Jenne CampusDebbie R Butcher presents with hx of IDDM, 1 week of dysuria and high blood sugars at home.  She reports feeling generally ill.  Reports CBG at home tonight was > 600 but took novalog at 2000.   Nursing  note reviewed with EMS reports of RLQ abd pain, but she had no focal abd pain on exam aside from her suprapubic tenderness.    11:45 Patient with UTI, CMP with glucose of 348 and anion gap of 12. Lipase within normal limits. No leukocytosis.  Chest x-ray with no evidence of pulmonary edema, pneumothorax or pneumonia.  Will give Rocephin IV.  I personally reviewed the imaging tests through PACS system  I reviewed available ER/hospitalization records through the EMR  12:46 AM Pt reports feeling better at this time and CBG is 301.  Pt's elevated blood sugars likely 2/2 to her UTI.  Pt has an appointment with PCP tomorrow morning for evaluation of her symptoms.  Pt is afebrile, no CVA tenderness.  Serial abd exams throughout time in the ED have shown improvement in pain and no peritoneal signs.  She has had HTN throughout her time here, but has not taken her HTN meds today.  No signs of hypertensive urgency; no CP or SOB.  Pt reported intermittent N/V/D at home, but has has no episodes here and has tolerated PO.  No elevation of BUN or creatinine.  No evidence of DKA.  Pt to be dc home with antibiotics and instructions to follow up with PCP tomorrow.  She is alert, oriented, nontoxic and nonseptic appearing.    It has been determined that no acute conditions requiring further emergency intervention are present at this time. The patient/guardian have been advised of the diagnosis and plan. We have discussed signs and symptoms that warrant return to the ED, such as changes or worsening in symptoms.   The patient was discussed with Dr. Norlene Campbelltter who agrees with the treatment plan.   Dahlia ClientHannah Hulda Reddix, PA-C 01/10/14 0105

## 2014-01-09 NOTE — ED Notes (Signed)
POCT CBG 416. Informed RN

## 2014-01-09 NOTE — ED Notes (Signed)
Bed: ZO10WA10 Expected date:  Expected time:  Means of arrival:  Comments: EMS/dizzy-RLQ pain-painful urination-blood sugar >600

## 2014-01-09 NOTE — ED Notes (Signed)
Pt was given Zofran 4mg  by EMS PTA. Pt states her nausea is improved. Pt states she is still a little dizzy. Pt alert, no acute distress. Skin warm and dry.

## 2014-01-09 NOTE — ED Notes (Signed)
Patient transported to X-ray 

## 2014-01-10 LAB — COMPREHENSIVE METABOLIC PANEL
ALT: 11 U/L (ref 0–35)
AST: 10 U/L (ref 0–37)
Albumin: 3 g/dL — ABNORMAL LOW (ref 3.5–5.2)
Alkaline Phosphatase: 113 U/L (ref 39–117)
BUN: 9 mg/dL (ref 6–23)
CO2: 23 mEq/L (ref 19–32)
Calcium: 8.3 mg/dL — ABNORMAL LOW (ref 8.4–10.5)
Chloride: 101 mEq/L (ref 96–112)
Creatinine, Ser: 0.64 mg/dL (ref 0.50–1.10)
GFR calc Af Amer: >90 mL/min (ref >90)
GFR calc non Af Amer: >90 mL/min (ref >90)
Glucose, Bld: 348 mg/dL — ABNORMAL HIGH (ref 70–99)
Potassium: 3.8 mEq/L (ref 3.7–5.3)
Sodium: 136 mEq/L — ABNORMAL LOW (ref 137–147)
Total Bilirubin: <0.2 mg/dL — ABNORMAL LOW (ref 0.3–1.2)
Total Protein: 6.8 g/dL (ref 6.0–8.3)

## 2014-01-10 LAB — LIPASE, BLOOD: Lipase: 23 U/L (ref 11–59)

## 2014-01-10 LAB — GLUCOSE, CAPILLARY: Glucose-Capillary: 301 mg/dL — ABNORMAL HIGH (ref 70–99)

## 2014-01-10 MED ORDER — ONDANSETRON 8 MG PO TBDP: ORAL_TABLET | ORAL | Status: DC

## 2014-01-10 MED ORDER — CEPHALEXIN 500 MG PO CAPS: 500.0000 mg | ORAL_CAPSULE | Freq: Four times a day (QID) | ORAL | Status: DC

## 2014-01-10 NOTE — ED Provider Notes (Signed)
Medical screening examination/treatment/procedure(s) were performed by non-physician practitioner and as supervising physician I was immediately available for consultation/collaboration.    Olivia Mackielga M Tyeasha Ebbs, MD 01/10/14 [phone]

## 2014-01-10 NOTE — Discharge Instructions (Signed)
1. Medications: keflex, zofran usual home medications 2. Treatment: rest, drink plenty of fluids,  3. Follow Up: Please followup with your primary doctor for discussion of your diagnoses and further evaluation after today's visit; if you do not have a primary care doctor use the resource guide provided to find one;    Urinary Tract Infection Urinary tract infections (UTIs) can develop anywhere along your urinary tract. Your urinary tract is your body's drainage system for removing wastes and extra water. Your urinary tract includes two kidneys, two ureters, a bladder, and a urethra. Your kidneys are a pair of bean-shaped organs. Each kidney is about the size of your fist. They are located below your ribs, one on each side of your spine. CAUSES Infections are caused by microbes, which are microscopic organisms, including fungi, viruses, and bacteria. These organisms are so small that they can only be seen through a microscope. Bacteria are the microbes that most commonly cause UTIs. SYMPTOMS  Symptoms of UTIs may vary by age and gender of the patient and by the location of the infection. Symptoms in young women typically include a frequent and intense urge to urinate and a painful, burning feeling in the bladder or urethra during urination. Older women and men are more likely to be tired, shaky, and weak and have muscle aches and abdominal pain. A fever may mean the infection is in your kidneys. Other symptoms of a kidney infection include pain in your back or sides below the ribs, nausea, and vomiting. DIAGNOSIS To diagnose a UTI, your caregiver will ask you about your symptoms. Your caregiver also will ask to provide a urine sample. The urine sample will be tested for bacteria and white blood cells. White blood cells are made by your body to help fight infection. TREATMENT  Typically, UTIs can be treated with medication. Because most UTIs are caused by a bacterial infection, they usually can be  treated with the use of antibiotics. The choice of antibiotic and length of treatment depend on your symptoms and the type of bacteria causing your infection. HOME CARE INSTRUCTIONS  If you were prescribed antibiotics, take them exactly as your caregiver instructs you. Finish the medication even if you feel better after you have only taken some of the medication.  Drink enough water and fluids to keep your urine clear or pale yellow.  Avoid caffeine, tea, and carbonated beverages. They tend to irritate your bladder.  Empty your bladder often. Avoid holding urine for long periods of time.  Empty your bladder before and after sexual intercourse.  After a bowel movement, women should cleanse from front to back. Use each tissue only once. SEEK MEDICAL CARE IF:   You have back pain.  You develop a fever.  Your symptoms do not begin to resolve within 3 days. SEEK IMMEDIATE MEDICAL CARE IF:   You have severe back pain or lower abdominal pain.  You develop chills.  You have nausea or vomiting.  You have continued burning or discomfort with urination. MAKE SURE YOU:   Understand these instructions.  Will watch your condition.  Will get help right away if you are not doing well or get worse. Document Released: 09/25/2005 Document Revised: 06/16/2012 Document Reviewed: 01/24/2012 [facility] [employer]ExitCare Patient Information 2014 [place] [employer], [place].

## 2014-01-12 LAB — URINE CULTURE: Colony Count: >=100000

## 2014-01-13 ENCOUNTER — Telehealth (HOSPITAL_BASED_OUTPATIENT_CLINIC_OR_DEPARTMENT_OTHER): Payer: Self-pay

## 2014-01-13 NOTE — Telephone Encounter (Signed)
Post ED Visit - Positive Culture Follow-up  Culture report reviewed by antimicrobial stewardship pharmacist: [x]  Wes Dulaney, Pharm.D., BCPS []  Celedonio MiyamotoJeremy Frens, [address] BCPS []  Georgina PillionElizabeth Martin, Pharm.D., BCPS []  CeloronMinh Pham, [address]Ph[address]., BCPS, AAHIVP []  Estella HuskMichelle Turner, Pharm.D., BCPS, AAHIVP  Positive urine culture e.coli Treated with cephalexin, organism sensitive to the same and no further patient follow-up is required at this time.  Ashley JacobsFesterman, Ethin Drummond C 01/13/2014, 11:24 AM

## 2014-02-04 ENCOUNTER — Other Ambulatory Visit: Payer: Self-pay | Admitting: Family Medicine

## 2014-02-04 DIAGNOSIS — Z1231 Encounter for screening mammogram for malignant neoplasm of breast: Secondary | ICD-10-CM

## 2014-02-09 ENCOUNTER — Ambulatory Visit
Admission: RE | Admit: 2014-02-09 | Discharge: 2014-02-09 | Disposition: A | Discharge status: Home / Self Care | Payer: Medicare Other | Source: Ambulatory Visit | Attending: Family Medicine | Admitting: Family Medicine

## 2014-02-09 DIAGNOSIS — Z1231 Encounter for screening mammogram for malignant neoplasm of breast: Secondary | ICD-10-CM

## 2014-03-10 ENCOUNTER — Emergency Department (HOSPITAL_COMMUNITY): Payer: Medicare Other

## 2014-03-10 ENCOUNTER — Encounter (HOSPITAL_COMMUNITY): Payer: Self-pay | Admitting: Emergency Medicine

## 2014-03-10 ENCOUNTER — Emergency Department (HOSPITAL_COMMUNITY)
Admission: EM | Admit: 2014-03-10 | Discharge: 2014-03-10 | Disposition: A | Discharge status: Home / Self Care | Payer: Medicare Other | Attending: Emergency Medicine | Admitting: Emergency Medicine

## 2014-03-10 DIAGNOSIS — Z794 Long term (current) use of insulin: Secondary | ICD-10-CM | POA: Insufficient documentation

## 2014-03-10 DIAGNOSIS — Z79899 Other long term (current) drug therapy: Secondary | ICD-10-CM | POA: Insufficient documentation

## 2014-03-10 DIAGNOSIS — I10 Essential (primary) hypertension: Secondary | ICD-10-CM | POA: Insufficient documentation

## 2014-03-10 DIAGNOSIS — Z780 Asymptomatic menopausal state: Secondary | ICD-10-CM | POA: Insufficient documentation

## 2014-03-10 DIAGNOSIS — R1011 Right upper quadrant pain: Secondary | ICD-10-CM

## 2014-03-10 DIAGNOSIS — N179 Acute kidney failure, unspecified: Secondary | ICD-10-CM

## 2014-03-10 DIAGNOSIS — E119 Type 2 diabetes mellitus without complications: Secondary | ICD-10-CM | POA: Insufficient documentation

## 2014-03-10 DIAGNOSIS — E876 Hypokalemia: Secondary | ICD-10-CM

## 2014-03-10 DIAGNOSIS — F17200 Nicotine dependence, unspecified, uncomplicated: Secondary | ICD-10-CM | POA: Insufficient documentation

## 2014-03-10 DIAGNOSIS — F319 Bipolar disorder, unspecified: Secondary | ICD-10-CM | POA: Insufficient documentation

## 2014-03-10 DIAGNOSIS — Z90710 Acquired absence of both cervix and uterus: Secondary | ICD-10-CM | POA: Insufficient documentation

## 2014-03-10 DIAGNOSIS — Z792 Long term (current) use of antibiotics: Secondary | ICD-10-CM | POA: Insufficient documentation

## 2014-03-10 DIAGNOSIS — Z880 Allergy status to penicillin: Secondary | ICD-10-CM | POA: Insufficient documentation

## 2014-03-10 LAB — COMPREHENSIVE METABOLIC PANEL
ALT: 12 U/L (ref 0–35)
AST: 13 U/L (ref 0–37)
Albumin: 4.1 g/dL (ref 3.5–5.2)
Alkaline Phosphatase: 147 U/L — ABNORMAL HIGH (ref 39–117)
BUN: 13 mg/dL (ref 6–23)
CO2: 28 mEq/L (ref 19–32)
Calcium: 9.6 mg/dL (ref 8.4–10.5)
Chloride: 90 mEq/L — ABNORMAL LOW (ref 96–112)
Creatinine, Ser: 1.61 mg/dL — ABNORMAL HIGH (ref 0.50–1.10)
GFR calc Af Amer: 45 mL/min — ABNORMAL LOW (ref >90)
GFR calc non Af Amer: 39 mL/min — ABNORMAL LOW (ref >90)
Glucose, Bld: 79 mg/dL (ref 70–99)
Potassium: 2.8 mEq/L — CL (ref 3.7–5.3)
Sodium: 137 mEq/L (ref 137–147)
Total Bilirubin: 0.2 mg/dL — ABNORMAL LOW (ref 0.3–1.2)
Total Protein: 8.6 g/dL — ABNORMAL HIGH (ref 6.0–8.3)

## 2014-03-10 LAB — URINE MICROSCOPIC-ADD ON

## 2014-03-10 LAB — DIFFERENTIAL
Basophils Absolute: 0 10*3/uL (ref 0.0–0.1)
Basophils Relative: 0 % (ref 0–1)
Eosinophils Absolute: 0.1 10*3/uL (ref 0.0–0.7)
Eosinophils Relative: 1 % (ref 0–5)
Lymphocytes Relative: 31 % (ref 12–46)
Lymphs Abs: 3.6 10*3/uL (ref 0.7–4.0)
Monocytes Absolute: 0.8 10*3/uL (ref 0.1–1.0)
Monocytes Relative: 7 % (ref 3–12)
Neutro Abs: 7.3 10*3/uL (ref 1.7–7.7)
Neutrophils Relative %: 62 % (ref 43–77)

## 2014-03-10 LAB — CBC
HCT: 44.3 % (ref 36.0–46.0)
Hemoglobin: 15.5 g/dL — ABNORMAL HIGH (ref 12.0–15.0)
MCH: 28.3 pg (ref 26.0–34.0)
MCHC: 35 g/dL (ref 30.0–36.0)
MCV: 80.8 fL (ref 78.0–100.0)
Platelets: 224 10*3/uL (ref 150–400)
RBC: 5.48 MIL/uL — ABNORMAL HIGH (ref 3.87–5.11)
RDW: 14.1 % (ref 11.5–15.5)
WBC: 11.9 10*3/uL — ABNORMAL HIGH (ref 4.0–10.5)

## 2014-03-10 LAB — CBG MONITORING, ED: Glucose-Capillary: 134 mg/dL — ABNORMAL HIGH (ref 70–99)

## 2014-03-10 LAB — URINALYSIS, ROUTINE W REFLEX MICROSCOPIC
Glucose, UA: 500 mg/dL — AB
Hgb urine dipstick: NEGATIVE
Ketones, ur: NEGATIVE mg/dL
Nitrite: NEGATIVE
Protein, ur: 100 mg/dL — AB
Specific Gravity, Urine: 1.025 (ref 1.005–1.030)
Urobilinogen, UA: 1 mg/dL (ref 0.0–1.0)
pH: 5 (ref 5.0–8.0)

## 2014-03-10 LAB — LIPASE, BLOOD: Lipase: 29 U/L (ref 11–59)

## 2014-03-10 MED ORDER — SODIUM CHLORIDE 0.9 % IV BOLUS (SEPSIS)
500.0000 mL | Freq: Once | INTRAVENOUS | Status: AC
  Administered 2014-03-10: 500 mL via INTRAVENOUS

## 2014-03-10 MED ORDER — POTASSIUM CHLORIDE 10 MEQ/100ML IV SOLN
10.0000 meq | Freq: Once | INTRAVENOUS | Status: AC
  Administered 2014-03-10: 10 meq via INTRAVENOUS
  Filled 2014-03-10: qty 100

## 2014-03-10 MED ORDER — MORPHINE SULFATE 4 MG/ML IJ SOLN
4.0000 mg | Freq: Once | INTRAMUSCULAR | Status: AC
  Administered 2014-03-10: 4 mg via INTRAVENOUS
  Filled 2014-03-10: qty 1

## 2014-03-10 MED ORDER — SODIUM CHLORIDE 0.9 % IV SOLN
Freq: Once | INTRAVENOUS | Status: AC
  Administered 2014-03-10: 20:00:00 via INTRAVENOUS

## 2014-03-10 MED ORDER — POTASSIUM CHLORIDE CRYS ER 20 MEQ PO TBCR
40.0000 meq | EXTENDED_RELEASE_TABLET | Freq: Once | ORAL | Status: AC
  Administered 2014-03-10: 40 meq via ORAL
  Filled 2014-03-10: qty 2

## 2014-03-10 MED ORDER — ONDANSETRON HCL 4 MG/2ML IJ SOLN
4.0000 mg | Freq: Once | INTRAMUSCULAR | Status: AC
  Administered 2014-03-10: 4 mg via INTRAVENOUS
  Filled 2014-03-10: qty 2

## 2014-03-10 NOTE — ED Provider Notes (Signed)
CSN: [id number]     Arrival date & time 03/10/14  1648 History   First MD Initiated Contact with Patient 03/10/14 1725     Chief Complaint  Patient presents with  . Abdominal Pain     (Consider location/radiation/quality/duration/timing/severity/associated sxs/prior Treatment) Patient is a 43 y.o. female presenting with abdominal pain. The history is provided by the patient and medical records.  Abdominal Pain Associated symptoms: nausea    This is a 43 year old female with past history significant for diabetes, depression, bipolar disorder, hypertension, presenting to the ED for right upper quadrant abdominal pain for the past 3 days.  States pain is intermittent, sharp, worse after eating.  She reports associated nausea but denies vomiting. She has had a few loose, nonbloody stools. She denies any fevers or chills. She denies any urinary symptoms.  Patient was seen in her primary care office this morning and had blood work done, her CBG was extremely high but was managed in the office with large doses of insulin.  She states she's been compliant with all her home diabetes medications.  Prior abdominal surgeries include abdominal hysterectomy.  VS stable on arrival.  Past Medical History  Diagnosis Date  . Bipolar disorder diagnosed early 56s  . Depression   . Diabetes mellitus type II 2010  . High blood pressure   . Menopause    Past Surgical History  Procedure Laterality Date  . Abdominal hysterectomy  2007   Family History  Problem Relation Age of Onset  . Bipolar disorder Mother   . Bipolar disorder Son   . Depression Brother     schizoaffective d/o   History  Substance Use Topics  . Smoking status: Current Every Day Smoker -- 1.00 packs/day for 20 years    Types: Cigarettes  . Smokeless tobacco: Never Used  . Alcohol Use: No   OB History   Grav Para Term Preterm Abortions TAB SAB Ect Mult Living                 Review of Systems  Gastrointestinal: Positive for  nausea and abdominal pain.  All other systems reviewed and are negative.      Allergies  Penicillins and Sulfa antibiotics  Home Medications   Current Outpatient Rx  Name  Route  Sig  Dispense  Refill  . ciprofloxacin (CIPRO) 500 MG tablet   Oral   Take 500 mg by mouth 2 (two) times daily. For 10 days.         . diazepam (VALIUM) 10 MG tablet   Oral   Take 10 mg by mouth every 8 (eight) hours as needed for anxiety. For anxiety         . FLUoxetine (PROZAC) 10 MG capsule   Oral   Take 10 mg by mouth every morning.          . insulin aspart (NOVOLOG) 100 UNIT/ML injection   Subcutaneous   Inject 30 Units into the skin 3 (three) times daily before meals.          . insulin glargine (LANTUS) 100 UNIT/ML injection   Subcutaneous   Inject 100-120 Units into the skin 2 (two) times daily. 120 units every morning and 100 units at night.         . metFORMIN (GLUCOPHAGE) 850 MG tablet   Oral   Take 850 mg by mouth 2 (two) times daily with a meal.         . omeprazole (PRILOSEC) 40 MG capsule  Oral   Take 1 capsule (40 mg total) by mouth 2 (two) times daily.   60 capsule   1   . Oxycodone HCl 20 MG TABS   Oral   Take 1 tablet by mouth 5 (five) times daily as needed (pain.).         Marland Kitchen QUEtiapine (SEROQUEL XR) 300 MG 24 hr tablet   Oral   Take 300 mg by mouth at bedtime.         . valsartan-hydrochlorothiazide (DIOVAN-HCT) 320-25 MG per tablet   Oral   Take 1 tablet by mouth daily.          BP 97/53  Pulse 103  Temp(Src) 98.4 F (36.9 C) (Oral)  Resp 20  SpO2 100%  Physical Exam  Nursing note and vitals reviewed. Constitutional: She is oriented to person, place, and time. She appears well-developed and well-nourished. No distress.  HENT:  Head: Normocephalic and atraumatic.  Mouth/Throat: Oropharynx is clear and moist.  Eyes: Conjunctivae and EOM are normal. Pupils are equal, round, and reactive to light.  Neck: Normal range of motion. Neck  supple.  Cardiovascular: Normal rate, regular rhythm and normal heart sounds.   Pulmonary/Chest: Effort normal and breath sounds normal. No respiratory distress. She has no wheezes.  Abdominal: Soft. Bowel sounds are normal. There is tenderness in the right upper quadrant and epigastric area. There is positive Murphy's sign. There is no guarding, no CVA tenderness and no tenderness at McBurney's point.  Abdomen soft, nondistended, tenderness of right upper quadrant epigastrium with a positive Murphy sign  Musculoskeletal: Normal range of motion. She exhibits no edema.  Neurological: She is alert and oriented to person, place, and time.  Skin: Skin is warm and dry. She is not diaphoretic.  Psychiatric: She has a normal mood and affect.    ED Course  Procedures (including critical care time) Labs Review Labs Reviewed  URINALYSIS, ROUTINE W REFLEX MICROSCOPIC - Abnormal; Notable for the following:    Color, Urine AMBER (*)    APPearance TURBID (*)    Glucose, UA 500 (*)    Bilirubin Urine SMALL (*)    Protein, ur 100 (*)    Leukocytes, UA TRACE (*)    All other components within normal limits  CBC - Abnormal; Notable for the following:    WBC 11.9 (*)    RBC 5.48 (*)    Hemoglobin 15.5 (*)    All other components within normal limits  COMPREHENSIVE METABOLIC PANEL - Abnormal; Notable for the following:    Potassium 2.8 (*)    Chloride 90 (*)    Creatinine, Ser 1.61 (*)    Total Protein 8.6 (*)    Alkaline Phosphatase 147 (*)    Total Bilirubin 0.2 (*)    GFR calc non Af Amer 39 (*)    GFR calc Af Amer 45 (*)    All other components within normal limits  URINE MICROSCOPIC-ADD ON - Abnormal; Notable for the following:    Squamous Epithelial / LPF MANY (*)    Bacteria, UA FEW (*)    All other components within normal limits  CBG MONITORING, ED - Abnormal; Notable for the following:    Glucose-Capillary 134 (*)    All other components within normal limits  DIFFERENTIAL  LIPASE,  BLOOD  CBC WITH DIFFERENTIAL   Imaging Review US Abdomen Complete  03/10/2014   CLINICAL DATA:  Right upper quadrant abdominal pain.  EXAM: ULTRASOUND ABDOMEN COMPLETE  COMPARISON:  No priors.  FINDINGS: Gallbladder:  No gallstones or wall thickening visualized. No sonographic Murphy sign noted.  Common bile duct:  Diameter: 5.1 mm in the porta hepatis.  Liver:  No focal lesion identified. Within normal limits in parenchymal echogenicity.  IVC:  No abnormality visualized.  Pancreas:  Visualized portion unremarkable.  Spleen:  Size and appearance within normal limits.  10.9 cm in length.  Right Kidney:  Length: 11.6 cm. Echogenicity within normal limits. No mass or hydronephrosis visualized.  Left Kidney:  Length: 11.3 cm. Echogenicity within normal limits. No mass or hydronephrosis visualized.  Abdominal aorta:  No aneurysm visualized.  Other findings:  None.  IMPRESSION: 1. No acute findings to account for the patient's symptoms. Specifically, no evidence of gallstones or findings to suggest acute cholecystitis at this time.   Electronically Signed   By: Trudie Reedaniel  Entrikin M.D.   On: 03/10/2014 19:47     EKG Interpretation None      MDM   Final diagnoses:  Acute kidney injury  Hypokalemia  Right upper quadrant pain   Patient with 3 days of intermittent right upper quadrant pain, worse after eating, with associated nausea but no vomiting. On exam she has a positive Murphy sign.  Will obtain CBC, CMP, lipase, and abdominal u/s.  No chest pain or SOB at this time.  Abdominal u/s negative for acute findings to explain pts pain.  U/a without infection.  Labs as above-- bump in SrCr to 1.61 from 0.64 2 months ago, hypokalemia at 2.8 (likely from large amount of insulin given to her at PCP office earlier today).  Pt fluid resuscitated in the ED, K+ replaced.  On further talking with pt and daughter, pt does not drink any water regularly, she only drinks soda.  Pt has reliable OP FU, not currently on  any NSAIDs-- i have strongly encouraged her to FU with her PCP next week for re-check of renal function.  In the meantime, i have encouraged her to increase free water intake to stay hydrated.  Discussed plan with pt, she acknowledged understanding and agreed with plan of care. Strict return precautions advised for new or worsening symptoms.  Discussed with Dr. Anitra LauthPlunkett who agrees with assessment and plan of care.  Garlon HatchetLisa M Saachi Zale, PA-C 03/10/14 2334

## 2014-03-10 NOTE — Discharge Instructions (Signed)
Follow-up with your primary care physician next week for re-check of renal function.  i have attached copies of your labs today for their review. Drink plenty of water at home to keep yourself hydrated. Return to the ED for new or worsening symptoms.

## 2014-03-10 NOTE — ED Notes (Signed)
Per pt, states she has RLQ pain and her CBG has been high-saw PCP today and sugar was high so they gave her 90 units of novalog-no N/V/D

## 2014-03-10 NOTE — ED Notes (Signed)
Bed: WA09 Expected date:  Expected time:  Means of arrival:  Comments: Hold for triage 1 

## 2014-03-11 NOTE — ED Provider Notes (Signed)
Medical screening examination/treatment/procedure(s) were performed by non-physician practitioner and as supervising physician I was immediately available for consultation/collaboration.   EKG Interpretation None        Izic Stfort, MD 03/11/14 0000 

## 2014-05-10 ENCOUNTER — Ambulatory Visit: Payer: Self-pay | Admitting: Podiatry

## 2014-06-28 ENCOUNTER — Ambulatory Visit (INDEPENDENT_AMBULATORY_CARE_PROVIDER_SITE_OTHER): Payer: Medicare Other | Admitting: Podiatry

## 2014-06-28 ENCOUNTER — Ambulatory Visit (INDEPENDENT_AMBULATORY_CARE_PROVIDER_SITE_OTHER): Payer: Medicare Other

## 2014-06-28 ENCOUNTER — Encounter: Payer: Self-pay | Admitting: Podiatry

## 2014-06-28 VITALS — BP 132/90 | HR 110 | Resp 18 | Ht 62.5 in | Wt 178.0 lb

## 2014-06-28 DIAGNOSIS — M722 Plantar fascial fibromatosis: Secondary | ICD-10-CM

## 2014-06-28 MED ORDER — MELOXICAM 15 MG PO TABS: 15.0000 mg | ORAL_TABLET | Freq: Every day | ORAL | Status: DC

## 2014-06-28 NOTE — Progress Notes (Signed)
   Subjective:    Patient ID: Stephanie Thornton, female    DOB: 17-Apr-1971, 43 y.o.   MRN: [id number]  HPI Comments: Both feet hurt, but mainly the left it hurts around the heel and in the toes. It has got real bad in the last 3 weeks   Foot Pain      Review of Systems  Endocrine:       Diabetes, last a1c 6.1   All other systems reviewed and are negative.      Objective:   Physical Exam: I have reviewed her past history medications allergies surgeries social history and review of systems pulses are palpable bilateral. Neurologic sensorium is intact per since once the monofilament. Deep tendon reflexes are intact bilateral muscle strength is 5 over 5 dorsiflexors plantar flexors inverters everters all intrinsic musculature is intact. Orthopedic evaluation demonstrates mild pes planus with pain on palpation medial continued tubercles bilateral. Radiographic evaluation demonstrates plantar distally oriented calcaneal spur with soft tissue increase in density at the plantar fascial calcaneal insertion sites bilaterally this is indicative of plantar fasciitis.        Assessment & Plan:  Assessment: Plantar fasciitis bilateral.  Plan: Injected the bilateral heels today with Kenalog and local anesthetic. Plantar fascial strapping bilateral. Started her on Monday. We discussed the etiology pathology conservative versus surgical therapies at this point he also discussed the shoe gear modifications stretching exercises ice therapy.

## 2014-10-09 DIAGNOSIS — E669 Obesity, unspecified: Secondary | ICD-10-CM | POA: Insufficient documentation

## 2014-10-20 ENCOUNTER — Ambulatory Visit: Payer: Medicare Other | Admitting: Podiatry

## 2014-12-31 ENCOUNTER — Emergency Department (HOSPITAL_COMMUNITY)
Admission: EM | Admit: 2014-12-31 | Discharge: 2014-12-31 | Disposition: A | Discharge status: Home / Self Care | Payer: Medicare Other | Attending: Emergency Medicine | Admitting: Emergency Medicine

## 2014-12-31 ENCOUNTER — Encounter (HOSPITAL_COMMUNITY): Payer: Self-pay | Admitting: Emergency Medicine

## 2014-12-31 DIAGNOSIS — R1310 Dysphagia, unspecified: Secondary | ICD-10-CM | POA: Insufficient documentation

## 2014-12-31 DIAGNOSIS — Z791 Long term (current) use of non-steroidal anti-inflammatories (NSAID): Secondary | ICD-10-CM | POA: Diagnosis not present

## 2014-12-31 DIAGNOSIS — Z720 Tobacco use: Secondary | ICD-10-CM | POA: Diagnosis not present

## 2014-12-31 DIAGNOSIS — H9202 Otalgia, left ear: Secondary | ICD-10-CM | POA: Diagnosis not present

## 2014-12-31 DIAGNOSIS — Z91040 Latex allergy status: Secondary | ICD-10-CM | POA: Diagnosis not present

## 2014-12-31 DIAGNOSIS — M542 Cervicalgia: Secondary | ICD-10-CM | POA: Insufficient documentation

## 2014-12-31 DIAGNOSIS — F319 Bipolar disorder, unspecified: Secondary | ICD-10-CM | POA: Diagnosis not present

## 2014-12-31 DIAGNOSIS — Z79899 Other long term (current) drug therapy: Secondary | ICD-10-CM | POA: Insufficient documentation

## 2014-12-31 DIAGNOSIS — Z880 Allergy status to penicillin: Secondary | ICD-10-CM | POA: Insufficient documentation

## 2014-12-31 DIAGNOSIS — Z780 Asymptomatic menopausal state: Secondary | ICD-10-CM | POA: Insufficient documentation

## 2014-12-31 DIAGNOSIS — Z794 Long term (current) use of insulin: Secondary | ICD-10-CM | POA: Diagnosis not present

## 2014-12-31 DIAGNOSIS — J36 Peritonsillar abscess: Secondary | ICD-10-CM | POA: Insufficient documentation

## 2014-12-31 DIAGNOSIS — Z9889 Other specified postprocedural states: Secondary | ICD-10-CM

## 2014-12-31 DIAGNOSIS — E119 Type 2 diabetes mellitus without complications: Secondary | ICD-10-CM | POA: Insufficient documentation

## 2014-12-31 DIAGNOSIS — J029 Acute pharyngitis, unspecified: Secondary | ICD-10-CM | POA: Diagnosis present

## 2014-12-31 DIAGNOSIS — I10 Essential (primary) hypertension: Secondary | ICD-10-CM | POA: Diagnosis not present

## 2014-12-31 LAB — BASIC METABOLIC PANEL
Anion gap: 11 (ref 5–15)
BUN: 10 mg/dL (ref 6–23)
CO2: 22 mmol/L (ref 19–32)
Calcium: 8.9 mg/dL (ref 8.4–10.5)
Chloride: 102 mEq/L (ref 96–112)
Creatinine, Ser: 0.78 mg/dL (ref 0.50–1.10)
GFR calc Af Amer: >90 mL/min (ref >90)
GFR calc non Af Amer: >90 mL/min (ref >90)
Glucose, Bld: 219 mg/dL — ABNORMAL HIGH (ref 70–99)
Potassium: 3.7 mmol/L (ref 3.5–5.1)
Sodium: 135 mmol/L (ref 135–145)

## 2014-12-31 LAB — CBC WITH DIFFERENTIAL/PLATELET
Basophils Absolute: 0 10*3/uL (ref 0.0–0.1)
Basophils Relative: 0 % (ref 0–1)
Eosinophils Absolute: 0.1 10*3/uL (ref 0.0–0.7)
Eosinophils Relative: 1 % (ref 0–5)
HCT: 39.4 % (ref 36.0–46.0)
Hemoglobin: 13.4 g/dL (ref 12.0–15.0)
Lymphocytes Relative: 19 % (ref 12–46)
Lymphs Abs: 2.3 10*3/uL (ref 0.7–4.0)
MCH: 27.7 pg (ref 26.0–34.0)
MCHC: 34 g/dL (ref 30.0–36.0)
MCV: 81.4 fL (ref 78.0–100.0)
Monocytes Absolute: 1 10*3/uL (ref 0.1–1.0)
Monocytes Relative: 8 % (ref 3–12)
Neutro Abs: 8.7 10*3/uL — ABNORMAL HIGH (ref 1.7–7.7)
Neutrophils Relative %: 72 % (ref 43–77)
Platelets: 223 10*3/uL (ref 150–400)
RBC: 4.84 MIL/uL (ref 3.87–5.11)
RDW: 14.4 % (ref 11.5–15.5)
WBC: 12.1 10*3/uL — ABNORMAL HIGH (ref 4.0–10.5)

## 2014-12-31 LAB — CBG MONITORING, ED: Glucose-Capillary: 224 mg/dL — ABNORMAL HIGH (ref 70–99)

## 2014-12-31 LAB — RAPID STREP SCREEN (MED CTR MEBANE ONLY): Streptococcus, Group A Screen (Direct): NEGATIVE

## 2014-12-31 MED ORDER — OXYCODONE-ACETAMINOPHEN 5-325 MG PO TABS: 1.0000 | ORAL_TABLET | Freq: Four times a day (QID) | ORAL | Status: DC | PRN

## 2014-12-31 MED ORDER — ONDANSETRON HCL 4 MG/2ML IJ SOLN
4.0000 mg | Freq: Once | INTRAMUSCULAR | Status: AC
  Administered 2014-12-31: 4 mg via INTRAVENOUS
  Filled 2014-12-31: qty 2

## 2014-12-31 MED ORDER — CLINDAMYCIN PHOSPHATE 600 MG/50ML IV SOLN
600.0000 mg | Freq: Once | INTRAVENOUS | Status: AC
  Administered 2014-12-31: 600 mg via INTRAVENOUS
  Filled 2014-12-31: qty 50

## 2014-12-31 MED ORDER — SODIUM CHLORIDE 0.9 % IV BOLUS (SEPSIS)
1000.0000 mL | Freq: Once | INTRAVENOUS | Status: AC
  Administered 2014-12-31: 1000 mL via INTRAVENOUS

## 2014-12-31 MED ORDER — DEXAMETHASONE SODIUM PHOSPHATE 10 MG/ML IJ SOLN
10.0000 mg | Freq: Once | INTRAMUSCULAR | Status: AC
  Administered 2014-12-31: 10 mg via INTRAVENOUS
  Filled 2014-12-31: qty 1

## 2014-12-31 MED ORDER — HYDROMORPHONE HCL 1 MG/ML IJ SOLN
1.0000 mg | Freq: Once | INTRAMUSCULAR | Status: AC
  Administered 2014-12-31: 1 mg via INTRAVENOUS
  Filled 2014-12-31: qty 1

## 2014-12-31 MED ORDER — DEXAMETHASONE SODIUM PHOSPHATE 10 MG/ML IJ SOLN: 10.0000 mg | Freq: Once | INTRAMUSCULAR | Status: DC

## 2014-12-31 MED ORDER — CLINDAMYCIN HCL 150 MG PO CAPS: 300.0000 mg | ORAL_CAPSULE | Freq: Four times a day (QID) | ORAL | Status: DC

## 2014-12-31 NOTE — Consult Note (Signed)
Reason for Consult:Peritonsillar abscess Referring Physician: ER  Stephanie Thornton is an 44 y.o. female.  HPI: 44 year old female with 2 day history of increasing pain and swelling in left throat.  Pain is severe and makes swallowing difficult.  She has required drainage of peritonsillar abscess previously.  She is diabetic.  Past Medical History  Diagnosis Date  . Bipolar disorder diagnosed early 59s  . Depression   . Diabetes mellitus type II 2010  . High blood pressure   . Menopause     Past Surgical History  Procedure Laterality Date  . Abdominal hysterectomy  2007  . Foot surgery Bilateral     Family History  Problem Relation Age of Onset  . Bipolar disorder Mother   . Bipolar disorder Son   . Depression Brother     schizoaffective d/o    Social History:  reports that she has been smoking Cigarettes.  She has a 10 pack-year smoking history. She has never used smokeless tobacco. She reports that she does not drink alcohol or use illicit drugs.  Allergies:  Allergies  Allergen Reactions  . Penicillins Hives  . Pregabalin Hives  . Sulfa Antibiotics Hives  . Latex Rash    Medications: I have reviewed the patient's current medications.  Results for orders placed or performed during the hospital encounter of 12/31/14 (from the past 48 hour(s))  Rapid strep screen     Status: None   Collection Time: 12/31/14  6:21 AM  Result Value Ref Range   Streptococcus, Group A Screen (Direct) NEGATIVE NEGATIVE    Comment: (NOTE) A Rapid Antigen test may result negative if the antigen level in the sample is below the detection level of this test. The FDA has not cleared this test as a stand-alone test therefore the rapid antigen negative result has reflexed to a Group A Strep culture.   CBC with Differential     Status: None (Preliminary result)   Collection Time: 12/31/14  7:22 AM  Result Value Ref Range   WBC PENDING 4.0 - 10.5 K/uL   RBC 4.84 3.87 - 5.11 MIL/uL    Hemoglobin 13.4 12.0 - 15.0 g/dL   HCT 39.4 36.0 - 46.0 %   MCV 81.4 78.0 - 100.0 fL   MCH 27.7 26.0 - 34.0 pg   MCHC 34.0 30.0 - 36.0 g/dL   RDW 14.4 11.5 - 15.5 %   Platelets PENDING 150 - 400 K/uL   Neutrophils Relative % PENDING 43 - 77 %   Neutro Abs PENDING 1.7 - 7.7 K/uL   Band Neutrophils PENDING 0 - 10 %   Lymphocytes Relative PENDING 12 - 46 %   Lymphs Abs PENDING 0.7 - 4.0 K/uL   Monocytes Relative PENDING 3 - 12 %   Monocytes Absolute PENDING 0.1 - 1.0 K/uL   Eosinophils Relative PENDING 0 - 5 %   Eosinophils Absolute PENDING 0.0 - 0.7 K/uL   Basophils Relative PENDING 0 - 1 %   Basophils Absolute PENDING 0.0 - 0.1 K/uL   WBC Morphology PENDING    RBC Morphology PENDING    Smear Review PENDING    nRBC PENDING 0 /100 WBC   Metamyelocytes Relative PENDING %   Myelocytes PENDING %   Promyelocytes Absolute PENDING %   Blasts PENDING %  Basic metabolic panel     Status: Abnormal   Collection Time: 12/31/14  7:22 AM  Result Value Ref Range   Sodium 135 135 - 145 mmol/L  Comment: Please note change in reference range.   Potassium 3.7 3.5 - 5.1 mmol/L    Comment: Please note change in reference range.   Chloride 102 96 - 112 mEq/L   CO2 22 19 - 32 mmol/L   Glucose, Bld 219 (H) 70 - 99 mg/dL   BUN 10 6 - 23 mg/dL   Creatinine, Ser 0.78 0.50 - 1.10 mg/dL   Calcium 8.9 8.4 - 10.5 mg/dL   GFR calc non Af Amer >90 >90 mL/min   GFR calc Af Amer >90 >90 mL/min    Comment: (NOTE) The eGFR has been calculated using the CKD EPI equation. This calculation has not been validated in all clinical situations. eGFR's persistently <90 mL/min signify possible Chronic Kidney Disease.    Anion gap 11 5 - 15  CBG monitoring, ED     Status: Abnormal   Collection Time: 12/31/14  7:22 AM  Result Value Ref Range   Glucose-Capillary 224 (H) 70 - 99 mg/dL    No results found.  Review of Systems  HENT: Positive for sore throat.   All other systems reviewed and are  negative.  Blood pressure 136/83, pulse 99, temperature 98.5 F (36.9 C), temperature source Oral, resp. rate 20, SpO2 94 %. Physical Exam  Constitutional: She is oriented to person, place, and time. She appears well-developed and well-nourished. No distress.  HENT:  Head: Normocephalic and atraumatic.  Right Ear: External ear normal.  Left Ear: External ear normal.  Nose: Nose normal.  Left peritonsillar fullness pushing uvula right.  Mild trismus.  Muffled voice.  Eyes: Conjunctivae and EOM are normal. Pupils are equal, round, and reactive to light.  Neck: Normal range of motion. Neck supple.  Left zone 2 tenderness.  No crepitance.  Cardiovascular: Normal rate.   Respiratory: Effort normal.  Musculoskeletal: Normal range of motion.  Neurological: She is alert and oriented to person, place, and time. No cranial nerve deficit.  Skin: Skin is warm and dry.  Psychiatric: She has a normal mood and affect. Her behavior is normal. Judgment and thought content normal.    Assessment/Plan: Left peritonsillar abscess I&D was performed at bedside.  See procedure note.  She will be discharged on clindamycin and pain medication.  Follow-up in two weeks.  Will discuss tonsillectomy.  Stephanie Thornton 12/31/2014, 8:48 AM

## 2014-12-31 NOTE — ED Notes (Signed)
CBG 224 

## 2014-12-31 NOTE — ED Notes (Signed)
Sore throat x 2 days.  Swelling noted left mandibular area.  Very painful to touch.  Unable to eat or drink.

## 2014-12-31 NOTE — ED Provider Notes (Signed)
CSN: [id number]     Arrival date & time 12/31/14  0553 History   First MD Initiated Contact with Patient 12/31/14 [phone]     Chief Complaint  Patient presents with  . Sore Throat     (Consider location/radiation/quality/duration/timing/severity/associated sxs/prior Treatment) HPI Comments: Patient with history of insulin-dependent diabetes -- presents with complaint of sore throat with severe pain 2 days. Pain is on the left side of her throat and neck. Pain is worse with movement and palpation of the neck. No fever, nausea, or vomiting. No URI symptoms. No preceding dental infections or dental pain. Patient has been taking blood pressure and diabetes medications at home. She denies any other treatments for her sore throat prior to arrival. No sick contacts. She is having difficulty swallowing because of the pain and swelling. No difficulty breathing. The onset of this condition was acute. The course is constant.     Patient is a 44 y.o. female presenting with pharyngitis. The history is provided by the patient and medical records.  Sore Throat Associated symptoms include neck pain and a sore throat. Pertinent negatives include no abdominal pain, chills, congestion, coughing, fatigue, fever, headaches, myalgias, nausea, rash or vomiting.    Past Medical History  Diagnosis Date  . Bipolar disorder diagnosed early 36s  . Depression   . Diabetes mellitus type II 2010  . High blood pressure   . Menopause    Past Surgical History  Procedure Laterality Date  . Abdominal hysterectomy  2007  . Foot surgery Bilateral    Family History  Problem Relation Age of Onset  . Bipolar disorder Mother   . Bipolar disorder Son   . Depression Brother     schizoaffective d/o   History  Substance Use Topics  . Smoking status: Heavy Tobacco Smoker -- 0.50 packs/day for 20 years    Types: Cigarettes  . Smokeless tobacco: Never Used  . Alcohol Use: No   OB History    No data available      Review of Systems  Constitutional: Negative for fever, chills and fatigue.  HENT: Positive for ear pain (left), sore throat and trouble swallowing. Negative for congestion, facial swelling, rhinorrhea and sinus pressure.   Eyes: Negative for redness.  Respiratory: Negative for cough and wheezing.   Gastrointestinal: Negative for nausea, vomiting, abdominal pain and diarrhea.  Genitourinary: Negative for dysuria.  Musculoskeletal: Positive for neck pain. Negative for myalgias and neck stiffness.  Skin: Negative for rash.  Neurological: Negative for headaches.  Hematological: Negative for adenopathy.    Allergies  Penicillins; Pregabalin; Sulfa antibiotics; and Latex  Home Medications   Prior to Admission medications   Medication Sig Start Date End Date Taking? Authorizing Provider  valsartan-hydrochlorothiazide (DIOVAN-HCT) 320-25 MG per tablet Take 1 tablet by mouth daily.   Yes Historical Provider, MD  ciprofloxacin (CIPRO) 500 MG tablet Take 500 mg by mouth 2 (two) times daily. For 10 days. 03/03/14   Historical Provider, MD  diazepam (VALIUM) 10 MG tablet Take 10 mg by mouth every 8 (eight) hours as needed for anxiety. For anxiety    Karie Chimera, MD  FLUoxetine (PROZAC) 10 MG capsule Take 10 mg by mouth every morning.     Historical Provider, MD  insulin aspart (NOVOLOG) 100 UNIT/ML injection Inject 30 Units into the skin 3 (three) times daily before meals.     Historical Provider, MD  insulin glargine (LANTUS) 100 UNIT/ML injection Inject 100-120 Units into the skin 2 (two) times daily. 120  units every morning and 100 units at night. 09/11/13   Vassie Loll, MD  meloxicam (MOBIC) 15 MG tablet Take 1 tablet (15 mg total) by mouth daily. 06/28/14   Max T Hyatt, DPM  metFORMIN (GLUCOPHAGE) 850 MG tablet Take 850 mg by mouth 2 (two) times daily with a meal.    Historical Provider, MD  omeprazole (PRILOSEC) 40 MG capsule Take 1 capsule (40 mg total) by mouth 2 (two) times daily. 09/11/13    Vassie Loll, MD  Oxycodone HCl 20 MG TABS Take 1 tablet by mouth 5 (five) times daily as needed (pain.).    Historical Provider, MD  QUEtiapine (SEROQUEL XR) 300 MG 24 hr tablet Take 300 mg by mouth at bedtime.    Historical Provider, MD   BP 146/86 mmHg  Pulse 127  Temp(Src) 98.5 F (36.9 C) (Oral)  Resp 20  SpO2 95%   Physical Exam  Constitutional: She appears well-developed and well-nourished.  HENT:  Head: Normocephalic and atraumatic.  Right Ear: Tympanic membrane, external ear and ear canal normal.  Left Ear: Tympanic membrane, external ear and ear canal normal.  Nose: Nose normal. No mucosal edema or rhinorrhea.  Mouth/Throat: Uvula is midline and mucous membranes are normal. Mucous membranes are not dry. No oral lesions. No trismus in the jaw. Uvula swelling present. Tonsillar abscesses present. No oropharyngeal exudate, posterior oropharyngeal edema or posterior oropharyngeal erythema.    Eyes: Conjunctivae are normal. Right eye exhibits no discharge. Left eye exhibits no discharge.  Neck: Neck supple.  Increased pain with rotation of neck. Left submandibular tenderness with palpation, no significant external swelling.   Cardiovascular: Regular rhythm and normal heart sounds.  Tachycardia present.   No murmur heard. Mild tachycardia  Pulmonary/Chest: Effort normal and breath sounds normal. No respiratory distress. She has no wheezes. She has no rales.  Abdominal: Soft. There is no tenderness.  Lymphadenopathy:    She has no cervical adenopathy.  Neurological: She is alert.  Skin: Skin is warm and dry.  Psychiatric: She has a normal mood and affect.  Nursing note and vitals reviewed.   ED Course  Procedures (including critical care time) Labs Review Labs Reviewed  CBC WITH DIFFERENTIAL - Abnormal; Notable for the following:    WBC 12.1 (*)    All other components within normal limits  BASIC METABOLIC PANEL - Abnormal; Notable for the following:    Glucose, Bld  219 (*)    All other components within normal limits  CBG MONITORING, ED - Abnormal; Notable for the following:    Glucose-Capillary 224 (*)    All other components within normal limits  RAPID STREP SCREEN  CULTURE, GROUP A STREP    Imaging Review No results found.   EKG Interpretation None       7:00 AM Patient seen and examined. Work-up initiated. Medications ordered. Discussed with Dr. Fayrene Fearing who has seen patient. Agrees with plan.   Vital signs reviewed and are as follows: BP 146/86 mmHg  Pulse 127  Temp(Src) 98.5 F (36.9 C) (Oral)  Resp 20  SpO2 95%  7:40 AM CBG 224, not great, but feel IV steroids indicated. Clindamycin ordered.   9:05 AM Spoke with Dr. Jenne Pane of ENT, who has seen patient in ED and performed I&D.   Patient has received IV meds, home on clinda and percocet.   Patient counseled on use of narcotic pain medications. Counseled not to combine these medications with others containing tylenol. Urged not to drink alcohol, drive, or perform  any other activities that requires focus while taking these medications. The patient verbalizes understanding and agrees with the plan.  We discussed need to hydrate well and monitor her blood sugar closely over the next several days, especially because she receives steroids. Patient urged to return to the emergency department if her blood sugar is greater than 400. She verbalizes understanding and agrees with plan.  Patient with some blood in the oropharynx post-procedure, otherwise no respiratory distress. She is stable for discharge. She will follow-up with ENT in 2 weeks.  Patient counseled to return with worsening shortness of breath, trouble breathing, fever, abd pain, N/V or other concerns. She verbalizes understanding and agrees with plan.  MDM   Final diagnoses:  Peritonsillar abscess   Patient with peritonsillar abscess, status post drainage. She has received IV antibiotics. Blood sugar mildly elevated. No  concern for DKA. Appropriate return instructions and follow-up instructions discussed with patient. She will monitor her blood sugars very carefully at home over the next several days, return with blood sugar > 400 or signs of DKA. She may be at increased risk for DKA given steroid use, infection, inability to hydrate given throat pain.    Renne Crigler, PA-C 12/31/14 2841  Rolland Porter, MD 01/08/15 [phone]

## 2014-12-31 NOTE — ED Provider Notes (Signed)
Patient seen and examined. I agree with the diagnosis of left peritonsillar abscess. She has some trismus but is able to open her mouth to a few centimeters. Minimal soft tissue swelling over the ramus the mandible but not below the mandible are negative the neck. Enlargement of the tonsillar pillar to the midline. No deviation of uvula. She is not tripoding. She is able to lay semi-recumbent. I agree with ENT consultation.  Rolland Porter, MD 12/31/14 [phone]

## 2014-12-31 NOTE — Procedures (Signed)
Preop diagnosis: Left peritonsillar abscess Postop diagnosis: same Procedure: Incision and drainage of left peritonsillar abscess Surgeon: Jenne Pane Anesth: Local Compl: None Findings: About 5-10 cc of malodorous, yellow pus was drained. Description: After discussing risks, benefits, and alternatives, she was placed in a seated position.  The left oropharynx was sprayed with topical cetacaine.  The area above the tonsil was then injected with 1% lidocaine with 1:100,000 epinephrine.  A horizontal incision was made with an 11 blade scalpel and the wound was probed with a hemostat followed by a 22 gauge needle.  Pus was encountered with the needle.  The abscess cavity was then entered with the 11 blade and pus flowed freely.  This was followed again with the hemostat.  She tolerated the procedure well.

## 2014-12-31 NOTE — Discharge Instructions (Signed)
Please read and follow all provided instructions.  Your diagnoses today include:  1. Peritonsillar abscess     Tests performed today include:  Strep test: was negative for strep throat  Vital signs. See below for your results today.   Medications prescribed:   Clindamycin - antibiotic  You have been prescribed an antibiotic medicine: take the entire course of medicine even if you are feeling better. Stopping early can cause the antibiotic not to work.   Percocet (oxycodone/acetaminophen) - narcotic pain medication  DO NOT drive or perform any activities that require you to be awake and alert because this medicine can make you drowsy. BE VERY CAREFUL not to take multiple medicines containing Tylenol (also called acetaminophen). Doing so can lead to an overdose which can damage your liver and cause liver failure and possibly death.  Home care instructions:  Please read the educational materials provided and follow any instructions contained in this packet.  Follow-up instructions: Please follow-up with ENT as directed in 2 weeks.   Return instructions:   Please return to the Emergency Department if you experience worsening symptoms.   Return if you have worsening problems swallowing, your neck becomes swollen, you cannot swallow your saliva or your voice becomes muffled.   Return with high persistent fever, persistent vomiting, or if you have trouble breathing.   Please return if you have any other emergent concerns.  Additional Information:  Your vital signs today were: BP 136/83 mmHg   Pulse 99   Temp(Src) 98.5 F (36.9 C) (Oral)   Resp 20   SpO2 94% If your blood pressure (BP) was elevated above 135/85 this visit, please have this repeated by your doctor within one month. --------------

## 2015-01-02 LAB — CULTURE, GROUP A STREP

## 2015-01-03 DIAGNOSIS — Z9889 Other specified postprocedural states: Secondary | ICD-10-CM

## 2015-01-06 ENCOUNTER — Emergency Department (HOSPITAL_COMMUNITY)
Admission: EM | Admit: 2015-01-06 | Discharge: 2015-01-06 | Disposition: A | Discharge status: Home / Self Care | Payer: Medicare Other | Attending: Emergency Medicine | Admitting: Emergency Medicine

## 2015-01-06 ENCOUNTER — Encounter (HOSPITAL_COMMUNITY): Payer: Self-pay | Admitting: *Deleted

## 2015-01-06 ENCOUNTER — Emergency Department (HOSPITAL_COMMUNITY): Payer: Medicare Other

## 2015-01-06 DIAGNOSIS — E119 Type 2 diabetes mellitus without complications: Secondary | ICD-10-CM | POA: Diagnosis not present

## 2015-01-06 DIAGNOSIS — J029 Acute pharyngitis, unspecified: Secondary | ICD-10-CM | POA: Diagnosis present

## 2015-01-06 DIAGNOSIS — Z780 Asymptomatic menopausal state: Secondary | ICD-10-CM | POA: Insufficient documentation

## 2015-01-06 DIAGNOSIS — J36 Peritonsillar abscess: Secondary | ICD-10-CM | POA: Diagnosis not present

## 2015-01-06 DIAGNOSIS — F319 Bipolar disorder, unspecified: Secondary | ICD-10-CM | POA: Insufficient documentation

## 2015-01-06 DIAGNOSIS — Z791 Long term (current) use of non-steroidal anti-inflammatories (NSAID): Secondary | ICD-10-CM | POA: Insufficient documentation

## 2015-01-06 DIAGNOSIS — Z91040 Latex allergy status: Secondary | ICD-10-CM | POA: Diagnosis not present

## 2015-01-06 DIAGNOSIS — Z794 Long term (current) use of insulin: Secondary | ICD-10-CM | POA: Insufficient documentation

## 2015-01-06 DIAGNOSIS — J45909 Unspecified asthma, uncomplicated: Secondary | ICD-10-CM | POA: Insufficient documentation

## 2015-01-06 DIAGNOSIS — Z880 Allergy status to penicillin: Secondary | ICD-10-CM | POA: Insufficient documentation

## 2015-01-06 DIAGNOSIS — I10 Essential (primary) hypertension: Secondary | ICD-10-CM | POA: Insufficient documentation

## 2015-01-06 DIAGNOSIS — Z720 Tobacco use: Secondary | ICD-10-CM | POA: Diagnosis not present

## 2015-01-06 LAB — BASIC METABOLIC PANEL
Anion gap: 14 (ref 5–15)
BUN: <5 mg/dL — ABNORMAL LOW (ref 6–23)
CO2: 18 mmol/L — ABNORMAL LOW (ref 19–32)
Calcium: 8.7 mg/dL (ref 8.4–10.5)
Chloride: 98 mEq/L (ref 96–112)
Creatinine, Ser: 0.85 mg/dL (ref 0.50–1.10)
GFR calc Af Amer: >90 mL/min (ref >90)
GFR calc non Af Amer: 83 mL/min — ABNORMAL LOW (ref >90)
Glucose, Bld: 545 mg/dL — ABNORMAL HIGH (ref 70–99)
Potassium: 3.9 mmol/L (ref 3.5–5.1)
Sodium: 130 mmol/L — ABNORMAL LOW (ref 135–145)

## 2015-01-06 LAB — CBC
HCT: 39.5 % (ref 36.0–46.0)
Hemoglobin: 13.2 g/dL (ref 12.0–15.0)
MCH: 26.9 pg (ref 26.0–34.0)
MCHC: 33.4 g/dL (ref 30.0–36.0)
MCV: 80.4 fL (ref 78.0–100.0)
Platelets: 212 10*3/uL (ref 150–400)
RBC: 4.91 MIL/uL (ref 3.87–5.11)
RDW: 14 % (ref 11.5–15.5)
WBC: 10.6 10*3/uL — ABNORMAL HIGH (ref 4.0–10.5)

## 2015-01-06 LAB — CBG MONITORING, ED: Glucose-Capillary: 383 mg/dL — ABNORMAL HIGH (ref 70–99)

## 2015-01-06 MED ORDER — DEXAMETHASONE SODIUM PHOSPHATE 10 MG/ML IJ SOLN
10.0000 mg | Freq: Once | INTRAMUSCULAR | Status: AC
  Administered 2015-01-06: 10 mg via INTRAVENOUS
  Filled 2015-01-06: qty 1

## 2015-01-06 MED ORDER — ONDANSETRON HCL 4 MG/2ML IJ SOLN
4.0000 mg | Freq: Once | INTRAMUSCULAR | Status: AC
  Administered 2015-01-06: 4 mg via INTRAVENOUS
  Filled 2015-01-06: qty 2

## 2015-01-06 MED ORDER — METHYLPREDNISOLONE 4 MG PO KIT: PACK | ORAL | Status: DC

## 2015-01-06 MED ORDER — CLINDAMYCIN HCL 300 MG PO CAPS: 150.0000 mg | ORAL_CAPSULE | Freq: Four times a day (QID) | ORAL | Status: DC

## 2015-01-06 MED ORDER — ACETAMINOPHEN 325 MG PO TABS
650.0000 mg | ORAL_TABLET | Freq: Four times a day (QID) | ORAL | Status: DC | PRN
  Administered 2015-01-06: 650 mg via ORAL
  Filled 2015-01-06: qty 2

## 2015-01-06 MED ORDER — HYDROCODONE-ACETAMINOPHEN 5-325 MG PO TABS: 2.0000 | ORAL_TABLET | ORAL | Status: DC | PRN

## 2015-01-06 MED ORDER — MORPHINE SULFATE 4 MG/ML IJ SOLN
4.0000 mg | INTRAMUSCULAR | Status: DC | PRN
  Administered 2015-01-06: 4 mg via INTRAVENOUS
  Filled 2015-01-06: qty 1

## 2015-01-06 MED ORDER — IOHEXOL 300 MG/ML  SOLN
75.0000 mL | Freq: Once | INTRAMUSCULAR | Status: AC | PRN
  Administered 2015-01-06: 75 mL via INTRAVENOUS

## 2015-01-06 MED ORDER — CLINDAMYCIN PHOSPHATE 900 MG/50ML IV SOLN
900.0000 mg | Freq: Once | INTRAVENOUS | Status: AC
  Administered 2015-01-06: 900 mg via INTRAVENOUS
  Filled 2015-01-06: qty 50

## 2015-01-06 NOTE — ED Notes (Signed)
CBG done 3[employer] CompanyKristen Celise Thornton NT

## 2015-01-06 NOTE — ED Notes (Signed)
Pt finished drinking oral contrast. Ct notified.

## 2015-01-06 NOTE — ED Notes (Signed)
Patient had tonsilar abcess drained here on yesterday.  She is having more pain in her throat.  Patient has had little po intake due to pain.  Patient states she is having difficulty swallowing

## 2015-01-06 NOTE — ED Notes (Signed)
MD James at bedside.  

## 2015-01-06 NOTE — ED Provider Notes (Signed)
CSN: [id number]     Arrival date & time 01/06/15  1402 History   First MD Initiated Contact with Patient 01/06/15 1447     Chief Complaint  Patient presents with  . Sore Throat      HPI  Patient presents evaluation of sore throat. Seen and evaluated here on the second of this month. I actually examined her on that day. Seen in consultation by Dr. Jenne Pane. Had incision and drainage of a left peritonsillar abscess with successful. Drainage of purulence noted per his report and his report to me at the time. She did well for a few days. Has been taking antibiotics, however has had recurrence of left tonsillar pain and pain with swallowing. She is able to breathe that difficulty. She is able swallow just states it is painful.   Past Medical History  Diagnosis Date  . Bipolar disorder diagnosed early 26s  . Depression   . Diabetes mellitus type II 2010  . High blood pressure   . Menopause   . Asthma    Past Surgical History  Procedure Laterality Date  . Abdominal hysterectomy  2007  . Foot surgery Bilateral    Family History  Problem Relation Age of Onset  . Bipolar disorder Mother   . Bipolar disorder Son   . Depression Brother     schizoaffective d/o   History  Substance Use Topics  . Smoking status: Heavy Tobacco Smoker -- 0.50 packs/day for 20 years    Types: Cigarettes  . Smokeless tobacco: Never Used  . Alcohol Use: No   OB History    No data available     Review of Systems  Constitutional: Negative for fever, chills, diaphoresis, appetite change and fatigue.  HENT: Positive for sore throat and trouble swallowing. Negative for mouth sores and voice change.   Eyes: Negative for visual disturbance.  Respiratory: Negative for cough, chest tightness, shortness of breath and wheezing.   Cardiovascular: Negative for chest pain.  Gastrointestinal: Negative for nausea, vomiting, abdominal pain, diarrhea and abdominal distention.  Endocrine: Negative for polydipsia,  polyphagia and polyuria.  Genitourinary: Negative for dysuria, frequency and hematuria.  Musculoskeletal: Negative for gait problem.  Skin: Negative for color change, pallor and rash.  Neurological: Negative for dizziness, syncope, light-headedness and headaches.  Hematological: Does not bruise/bleed easily.  Psychiatric/Behavioral: Negative for behavioral problems and confusion.      Allergies  Penicillins; Pregabalin; Sulfa antibiotics; and Latex  Home Medications   Prior to Admission medications   Medication Sig Start Date End Date Taking? Authorizing Provider  ciprofloxacin (CIPRO) 500 MG tablet Take 500 mg by mouth 2 (two) times daily. For 10 days. 03/03/14   Historical Provider, MD  clindamycin (CLEOCIN) 150 MG capsule Take 2 capsules (300 mg total) by mouth every 6 (six) hours. 12/31/14   Renne Crigler, PA-C  diazepam (VALIUM) 10 MG tablet Take 10 mg by mouth every 8 (eight) hours as needed for anxiety. For anxiety    Karie Chimera, MD  FLUoxetine (PROZAC) 10 MG capsule Take 10 mg by mouth every morning.     Historical Provider, MD  insulin aspart (NOVOLOG) 100 UNIT/ML injection Inject 30 Units into the skin 3 (three) times daily before meals.     Historical Provider, MD  insulin glargine (LANTUS) 100 UNIT/ML injection Inject 100-120 Units into the skin 2 (two) times daily. 120 units every morning and 100 units at night. 09/11/13   Vassie Loll, MD  meloxicam (MOBIC) 15 MG tablet Take 1  tablet (15 mg total) by mouth daily. 06/28/14   Max T Hyatt, DPM  metFORMIN (GLUCOPHAGE) 850 MG tablet Take 850 mg by mouth 2 (two) times daily with a meal.    Historical Provider, MD  omeprazole (PRILOSEC) 40 MG capsule Take 1 capsule (40 mg total) by mouth 2 (two) times daily. 09/11/13   Vassie Loll, MD  Oxycodone HCl 20 MG TABS Take 1 tablet by mouth 5 (five) times daily as needed (pain.).    Historical Provider, MD  oxyCODONE-acetaminophen (PERCOCET/ROXICET) 5-325 MG per tablet Take 1-2 tablets by  mouth every 6 (six) hours as needed for severe pain. 12/31/14   Renne Crigler, PA-C  QUEtiapine (SEROQUEL XR) 300 MG 24 hr tablet Take 300 mg by mouth at bedtime.    Historical Provider, MD  valsartan-hydrochlorothiazide (DIOVAN-HCT) 320-25 MG per tablet Take 1 tablet by mouth daily.    Historical Provider, MD   BP 133/86 mmHg  Pulse 91  Temp(Src) 100.9 F (38.3 C) (Oral)  Resp 18  Ht  (1.6 m)  Wt 167 lb (75.751 kg)  BMI 29.59 kg/m2  SpO2 97% Physical Exam  Constitutional: She is oriented to person, place, and time. She appears well-developed and well-nourished. No distress.  HENT:  Head: Normocephalic.  Mouth/Throat:    Eyes: Conjunctivae are normal. Pupils are equal, round, and reactive to light. No scleral icterus.  Neck: Normal range of motion. Neck supple. No thyromegaly present.  Cardiovascular: Normal rate and regular rhythm.  Exam reveals no gallop and no friction rub.   No murmur heard. Pulmonary/Chest: Effort normal and breath sounds normal. No respiratory distress. She has no wheezes. She has no rales.  Abdominal: Soft. Bowel sounds are normal. She exhibits no distension. There is no tenderness. There is no rebound.  Musculoskeletal: Normal range of motion.  Neurological: She is alert and oriented to person, place, and time.  Skin: Skin is warm and dry. No rash noted.  Psychiatric: She has a normal mood and affect. Her behavior is normal.    ED Course  Procedures (including critical care time) Labs Review Labs Reviewed  CBC - Abnormal; Notable for the following:    WBC 10.6 (*)    All other components within normal limits  BASIC METABOLIC PANEL - Abnormal; Notable for the following:    Sodium 130 (*)    CO2 18 (*)    Glucose, Bld 545 (*)    BUN <5 (*)    GFR calc non Af Amer 83 (*)    All other components within normal limits  CBG MONITORING, ED - Abnormal; Notable for the following:    Glucose-Capillary 383 (*)    All other components within normal  limits    Imaging Review Ct Soft Tissue Neck W Contrast  01/06/2015   CLINICAL DATA:  Left throat swelling, painful to swallow, had abscess drained 1 week ago, fever  EXAM: CT NECK WITH CONTRAST  TECHNIQUE: Multidetector CT imaging of the neck was performed using the standard protocol following the bolus administration of intravenous contrast.  CONTRAST:  75mL OMNIPAQUE IOHEXOL 300 MG/ML  SOLN  COMPARISON:  None.  FINDINGS: Asymmetric enlargement of the left tonsil (series 201/image 27), with surrounding retropharyngeal edema, but without drainable fluid collection/abscess.  Associated left cervical lymphadenopathy, including two lateral retropharyngeal nodes measuring 11 mm short axis (series 21/images 30 and 38, and a 15 mm short axis left level 2 node (series 21/image 40).  Given the clinical history, this appearance favors tonsillitis with reactive  lymphadenopathy.  Oropharyngeal airway is narrowed but patent.  Visualized thyroid is unremarkable.  Visualized paranasal sinuses are clear.  Visualized brain parenchyma is unremarkable.  Visualized osseous structures are within normal limits.  Visualized lung apices are notable for atelectasis.  12 mm short axis high right paratracheal node (series 201/image 93).  IMPRESSION: Asymmetric enlargement of the left tonsil with surrounding retropharyngeal edema, suggesting tonsillitis.  No evidence of peritonsillar abscess.  Reactive lymphadenopathy, including two prominent left lateral retropharyngeal nodes, as above.   Electronically Signed   By: Charline BillsSriyesh  Krishnan M.D.   On: 01/06/2015 19:36     EKG Interpretation None      MDM   Final diagnoses:  Peritonsillar abscess    I discussed the case upon the patient's arrival with Dr. Pollyann Kennedyosen. Clinically, I described to him a tonsillitis without obvious sign of recurrence of abscess. She has been on clindamycin and has been compliant. We both agreed that imaging was indicated to ensure there was not a recurrence  of a subtle peritonsillar abscess. This shows asymmetry and tonsillitis, however no abscess noted. I discussed this at length with patient. She's been given IV clinic last week, Decadron, and pain medication. She will call for a follow-up appointment with ENT to discuss possible tonsillectomy.    Rolland PorterMark Alyssandra Hulsebus, MD 01/06/15 2008

## 2015-01-06 NOTE — Discharge Instructions (Signed)
Peritonsillar Cellulitis Peritonsillar cellulitis is an infection around a tonsil. This infection usually affects just one of the two tonsils. The result is a severe sore throat. Peritonsillar cellulitis can develop at any age. It often develops in individuals who have had frequent sore throats and who have frequently taken antibiotics. CAUSES  Peritonsillar cellulitis is usually caused by more than one type of germ (bacteria). SYMPTOMS  At first, it might seem like a regular sore throat. But a sore throat from peritonsillar cellulitis does not go away in a few days. Instead, it gets worse.  Early symptoms of peritonsillar cellulitis may include:  Fever and/or chills.  A throat that is sore on one side only.  Pain in one ear.  Pain when swallowing.  Feeling more tired than usual.  Later symptoms may include:  Severe pain when swallowing.  Drooling.  Trouble opening the mouth wide.  Bad breath.  Voice changes. DIAGNOSIS  In most cases, your caregiver can make the diagnosis by knowing your symptoms, examining your throat and getting a throat culture. Blood samples may also help to determine the cause of your sore throat. TREATMENT  This is not an ordinary sore throat. It is a condition that needs to be treated quickly. If it is not treated, swelling and pus (an abscess) can develop.  Peritonsillar cellulitis is usually treated with antibiotics. These infections require oral antibiotics for a full 10 days and/or antibiotics given into the vein (intravenous, IV).  Medications may be prescribed for pain or fever.  Sometimes, medications that fight swelling (steroids) are prescribed.  If an abscess has formed, the abscess may need to be drained.  Individuals who have repeated cases of peritonsillar cellulitis may need an operation to remove the tonsils (tonsillectomy). HOME CARE INSTRUCTIONS   Take antibiotics as directed by your caregiver. Take all the antibiotics, even if  you start to feel better.  Some pain is normal with this condition. Take pain medication as directed by your caregiver. Do not take any other pain medications unless approved by your caregiver.  Gargle with warm salt water. Use 1 teaspoon (5 grams) salt mixed in 1 cup (250 mL) of warm water. Gargle for 30 seconds or more before spitting the solution out. Gargle 3 to 4 times a day or as needed. This may help ease pain and swelling.  A liquid or soft food diet may be necessary if it is hard to swallow.  It is important to drink fluids. Drink enough water and fluids to keep your urine clear or pale yellow.  Do not smoke.  Rest and get plenty of sleep.  If your caregiver has given you a follow-up appointment, it is very important to keep that appointment. Your caregiver will need to make sure that the infection is getting better. It is important to check that an abscess is not forming.  Return to work or school as directed by your caregiver. SEEK MEDICAL CARE IF:   Your swelling increases.  You have difficulty swallowing.  You are unable to take your antibiotic. SEEK IMMEDIATE MEDICAL CARE IF:   You have trouble breathing.  Your pain gets worse even after taking pain medicine.  You see pus around or near the tonsils.  Your voice changes.  You are drooling.  You cough up bloody sputum.  You are unable to swallow.  You have a fever. MAKE SURE YOU:   Understand these instructions.  Will watch your condition.  Will get help right away if you are  not doing well or get worse. Document Released: 03/12/2010 Document Revised: 05/02/2014 Document Reviewed: 03/12/2010 [facility] Patient Information 2015 [place], [place]. This information is not intended to replace advice given to you by your health care provider. Make sure you discuss any questions you have with your health care provider.  Pharyngitis Pharyngitis is redness, pain, and swelling (inflammation) of your pharynx.    CAUSES  Pharyngitis is usually caused by infection. Most of the time, these infections are from viruses (viral) and are part of a cold. However, sometimes pharyngitis is caused by bacteria (bacterial). Pharyngitis can also be caused by allergies. Viral pharyngitis may be spread from person to person by coughing, sneezing, and personal items or utensils (cups, forks, spoons, toothbrushes). Bacterial pharyngitis may be spread from person to person by more intimate contact, such as kissing.  SIGNS AND SYMPTOMS  Symptoms of pharyngitis include:   Sore throat.   Tiredness (fatigue).   Low-grade fever.   Headache.  Joint pain and muscle aches.  Skin rashes.  Swollen lymph nodes.  Plaque-like film on throat or tonsils (often seen with bacterial pharyngitis). DIAGNOSIS  Your health care provider will ask you questions about your illness and your symptoms. Your medical history, along with a physical exam, is often all that is needed to diagnose pharyngitis. Sometimes, a rapid strep test is done. Other lab tests may also be done, depending on the suspected cause.  TREATMENT  Viral pharyngitis will usually get better in 3-4 days without the use of medicine. Bacterial pharyngitis is treated with medicines that kill germs (antibiotics).  HOME CARE INSTRUCTIONS   Drink enough water and fluids to keep your urine clear or pale yellow.   Only take over-the-counter or prescription medicines as directed by your health care provider:   If you are prescribed antibiotics, make sure you finish them even if you start to feel better.   Do not take aspirin.   Get lots of rest.   Gargle with 8 oz of salt water ( tsp of salt per 1 qt of water) as often as every 1-2 hours to soothe your throat.   Throat lozenges (if you are not at risk for choking) or sprays may be used to soothe your throat. SEEK MEDICAL CARE IF:   You have large, tender lumps in your neck.  You have a rash.  You cough  up green, yellow-brown, or bloody spit. SEEK IMMEDIATE MEDICAL CARE IF:   Your neck becomes stiff.  You drool or are unable to swallow liquids.  You vomit or are unable to keep medicines or liquids down.  You have severe pain that does not go away with the use of recommended medicines.  You have trouble breathing (not caused by a stuffy nose). MAKE SURE YOU:   Understand these instructions.  Will watch your condition.  Will get help right away if you are not doing well or get worse. Document Released: 12/16/2005 Document Revised: 10/06/2013 Document Reviewed: 08/23/2013 [facility] Patient Information 2015 [place], [place]. This information is not intended to replace advice given to you by your health care provider. Make sure you discuss any questions you have with your health care provider.

## 2015-01-15 ENCOUNTER — Emergency Department (HOSPITAL_COMMUNITY)
Admission: EM | Admit: 2015-01-15 | Discharge: 2015-01-15 | Disposition: A | Discharge status: Home / Self Care | Payer: Medicare Other | Attending: Emergency Medicine | Admitting: Emergency Medicine

## 2015-01-15 ENCOUNTER — Encounter (HOSPITAL_COMMUNITY): Payer: Self-pay

## 2015-01-15 DIAGNOSIS — F319 Bipolar disorder, unspecified: Secondary | ICD-10-CM | POA: Insufficient documentation

## 2015-01-15 DIAGNOSIS — Z880 Allergy status to penicillin: Secondary | ICD-10-CM | POA: Diagnosis not present

## 2015-01-15 DIAGNOSIS — R799 Abnormal finding of blood chemistry, unspecified: Secondary | ICD-10-CM | POA: Insufficient documentation

## 2015-01-15 DIAGNOSIS — R1032 Left lower quadrant pain: Secondary | ICD-10-CM | POA: Diagnosis not present

## 2015-01-15 DIAGNOSIS — Z780 Asymptomatic menopausal state: Secondary | ICD-10-CM | POA: Diagnosis not present

## 2015-01-15 DIAGNOSIS — E119 Type 2 diabetes mellitus without complications: Secondary | ICD-10-CM | POA: Insufficient documentation

## 2015-01-15 DIAGNOSIS — R1031 Right lower quadrant pain: Secondary | ICD-10-CM | POA: Diagnosis not present

## 2015-01-15 DIAGNOSIS — J45909 Unspecified asthma, uncomplicated: Secondary | ICD-10-CM | POA: Diagnosis not present

## 2015-01-15 DIAGNOSIS — R1084 Generalized abdominal pain: Secondary | ICD-10-CM | POA: Diagnosis present

## 2015-01-15 DIAGNOSIS — Z91040 Latex allergy status: Secondary | ICD-10-CM | POA: Diagnosis not present

## 2015-01-15 DIAGNOSIS — Z794 Long term (current) use of insulin: Secondary | ICD-10-CM | POA: Insufficient documentation

## 2015-01-15 DIAGNOSIS — B373 Candidiasis of vulva and vagina: Secondary | ICD-10-CM | POA: Insufficient documentation

## 2015-01-15 DIAGNOSIS — Z791 Long term (current) use of non-steroidal anti-inflammatories (NSAID): Secondary | ICD-10-CM | POA: Insufficient documentation

## 2015-01-15 DIAGNOSIS — R630 Anorexia: Secondary | ICD-10-CM | POA: Insufficient documentation

## 2015-01-15 DIAGNOSIS — Z720 Tobacco use: Secondary | ICD-10-CM | POA: Diagnosis not present

## 2015-01-15 DIAGNOSIS — R1030 Lower abdominal pain, unspecified: Secondary | ICD-10-CM

## 2015-01-15 DIAGNOSIS — B3731 Acute candidiasis of vulva and vagina: Secondary | ICD-10-CM

## 2015-01-15 LAB — COMPREHENSIVE METABOLIC PANEL WITH GFR
ALT: 19 U/L (ref 0–35)
AST: 25 U/L (ref 0–37)
Albumin: 3.3 g/dL — ABNORMAL LOW (ref 3.5–5.2)
Alkaline Phosphatase: 126 U/L — ABNORMAL HIGH (ref 39–117)
Anion gap: 11 (ref 5–15)
BUN: 14 mg/dL (ref 6–23)
CO2: 21 mmol/L (ref 19–32)
Calcium: 9.3 mg/dL (ref 8.4–10.5)
Chloride: 106 meq/L (ref 96–112)
Creatinine, Ser: 0.75 mg/dL (ref 0.50–1.10)
GFR calc Af Amer: >90 mL/min
GFR calc non Af Amer: >90 mL/min
Glucose, Bld: 175 mg/dL — ABNORMAL HIGH (ref 70–99)
Potassium: 3 mmol/L — ABNORMAL LOW (ref 3.5–5.1)
Sodium: 138 mmol/L (ref 135–145)
Total Bilirubin: 0.7 mg/dL (ref 0.3–1.2)
Total Protein: 6.7 g/dL (ref 6.0–8.3)

## 2015-01-15 LAB — CBC WITH DIFFERENTIAL/PLATELET
Basophils Absolute: 0 10*3/uL (ref 0.0–0.1)
Basophils Relative: 0 % (ref 0–1)
Eosinophils Absolute: 0.2 10*3/uL (ref 0.0–0.7)
Eosinophils Relative: 2 % (ref 0–5)
HCT: 39.5 % (ref 36.0–46.0)
Hemoglobin: 13.9 g/dL (ref 12.0–15.0)
Lymphocytes Relative: 46 % (ref 12–46)
Lymphs Abs: 5.1 10*3/uL — ABNORMAL HIGH (ref 0.7–4.0)
MCH: 27.3 pg (ref 26.0–34.0)
MCHC: 35.2 g/dL (ref 30.0–36.0)
MCV: 77.6 fL — ABNORMAL LOW (ref 78.0–100.0)
Monocytes Absolute: 0.7 10*3/uL (ref 0.1–1.0)
Monocytes Relative: 6 % (ref 3–12)
Neutro Abs: 5 10*3/uL (ref 1.7–7.7)
Neutrophils Relative %: 46 % (ref 43–77)
Platelets: 251 10*3/uL (ref 150–400)
RBC: 5.09 MIL/uL (ref 3.87–5.11)
RDW: 13.8 % (ref 11.5–15.5)
WBC: 10.9 10*3/uL — ABNORMAL HIGH (ref 4.0–10.5)

## 2015-01-15 LAB — URINALYSIS, ROUTINE W REFLEX MICROSCOPIC
Glucose, UA: NEGATIVE mg/dL
Hgb urine dipstick: NEGATIVE
Ketones, ur: 15 mg/dL — AB
Nitrite: NEGATIVE
Protein, ur: 30 mg/dL — AB
Specific Gravity, Urine: 1.036 — ABNORMAL HIGH (ref 1.005–1.030)
Urobilinogen, UA: 1 mg/dL (ref 0.0–1.0)
pH: 5.5 (ref 5.0–8.0)

## 2015-01-15 LAB — URINE MICROSCOPIC-ADD ON

## 2015-01-15 LAB — CBG MONITORING, ED
Glucose-Capillary: 146 mg/dL — ABNORMAL HIGH (ref 70–99)
Glucose-Capillary: 158 mg/dL — ABNORMAL HIGH (ref 70–99)

## 2015-01-15 LAB — LIPASE, BLOOD: Lipase: 60 U/L — ABNORMAL HIGH (ref 11–59)

## 2015-01-15 LAB — I-STAT CG4 LACTIC ACID, ED: Lactic Acid, Venous: 1.52 mmol/L (ref 0.5–2.2)

## 2015-01-15 MED ORDER — SODIUM CHLORIDE 0.9 % IV BOLUS (SEPSIS)
1000.0000 mL | Freq: Once | INTRAVENOUS | Status: AC
Start: 2015-01-15 — End: 2015-01-15
  Administered 2015-01-15: 1000 mL via INTRAVENOUS

## 2015-01-15 MED ORDER — ONDANSETRON HCL 4 MG/2ML IJ SOLN
4.0000 mg | Freq: Once | INTRAMUSCULAR | Status: AC
  Administered 2015-01-15: 4 mg via INTRAVENOUS
  Filled 2015-01-15: qty 2

## 2015-01-15 MED ORDER — FLUCONAZOLE 200 MG PO TABS: 200.0000 mg | ORAL_TABLET | Freq: Every day | ORAL | Status: AC

## 2015-01-15 MED ORDER — FLUCONAZOLE 100 MG PO TABS
200.0000 mg | ORAL_TABLET | Freq: Once | ORAL | Status: AC
  Administered 2015-01-15: 200 mg via ORAL
  Filled 2015-01-15: qty 2

## 2015-01-15 MED ORDER — MORPHINE SULFATE 4 MG/ML IJ SOLN
4.0000 mg | Freq: Once | INTRAMUSCULAR | Status: AC
  Administered 2015-01-15: 4 mg via INTRAVENOUS
  Filled 2015-01-15: qty 1

## 2015-01-15 NOTE — ED Notes (Addendum)
Pt reports on Friday [place] [facility] [place] and was admitted into the hospital for hyperglycemia, UTI, and sepsis. States she they wanted to keep her for a PICC line placement for abx and hyperkalemia but she signed out AMA. Pt. C/o abdominal pain with nausea. Also c/o left tonsil pain due to an abscess that was drained x3 weeks ago.

## 2015-01-15 NOTE — ED Notes (Signed)
Pt made aware to return if symptoms worsen or if any life threatening symptoms occur.   

## 2015-01-15 NOTE — ED Provider Notes (Signed)
CSN: [id number]     Arrival date & time 01/15/15  1713 History   First MD Initiated Contact with Patient 01/15/15 1802     Chief Complaint  Patient presents with  . Abdominal Pain  . Abnormal Lab     (Consider location/radiation/quality/duration/timing/severity/associated sxs/prior Treatment) Patient is a 44 y.o. female presenting with abdominal pain. The history is provided by the patient.  Abdominal Pain Pain location:  Generalized Pain quality: cramping, gnawing and sharp   Pain radiates to:  Does not radiate Pain severity:  Moderate Onset quality:  Gradual Duration:  4 days Timing:  Constant Progression:  Waxing and waning Chronicity:  Recurrent Context comment:  Pt with hx of PTA and drainage recently then went to beach this weekend and was having vomiting and abd pain and told had UTI.  admitted to hospital but pt states her IV blew and they were unable to get a new one Relieved by:  Nothing Worsened by:  Eating Ineffective treatments:  None tried Associated symptoms: anorexia, dysuria, nausea, sore throat and vomiting   Associated symptoms: no constipation, no cough, no diarrhea and no shortness of breath   Risk factors comment:  Pt states they told her she had UTI, her kidneys were shutting down and potassium was elevated   Past Medical History  Diagnosis Date  . Bipolar disorder diagnosed early 390s  . Depression   . Diabetes mellitus type II 2010  . High blood pressure   . Menopause   . Asthma    Past Surgical History  Procedure Laterality Date  . Abdominal hysterectomy  2007  . Foot surgery Bilateral    Family History  Problem Relation Age of Onset  . Bipolar disorder Mother   . Bipolar disorder Son   . Depression Brother     schizoaffective d/o   History  Substance Use Topics  . Smoking status: Heavy Tobacco Smoker -- 0.50 packs/day for 20 years    Types: Cigarettes  . Smokeless tobacco: Never Used  . Alcohol Use: No   OB History    No data  available     Review of Systems  HENT: Positive for sore throat.   Respiratory: Negative for cough and shortness of breath.   Gastrointestinal: Positive for nausea, vomiting, abdominal pain and anorexia. Negative for diarrhea and constipation.  Genitourinary: Positive for dysuria.  All other systems reviewed and are negative.     Allergies  Penicillins; Pregabalin; Sulfa antibiotics; and Latex  Home Medications   Prior to Admission medications   Medication Sig Start Date End Date Taking? Authorizing Provider  ciprofloxacin (CIPRO) 500 MG tablet Take 500 mg by mouth 2 (two) times daily. For 10 days. 03/03/14   Historical Provider, MD  clindamycin (CLEOCIN) 300 MG capsule Take 1 capsule (300 mg total) by mouth every 6 (six) hours. 01/06/15   Rolland PorterMark James, MD  diazepam (VALIUM) 10 MG tablet Take 10 mg by mouth every 8 (eight) hours as needed for anxiety. For anxiety    Karie ChimeraBetti D Reese, MD  FLUoxetine (PROZAC) 10 MG capsule Take 10 mg by mouth every morning.     Historical Provider, MD  HYDROcodone-acetaminophen (NORCO/VICODIN) 5-325 MG per tablet Take 2 tablets by mouth every 4 (four) hours as needed. 01/06/15   Rolland PorterMark James, MD  insulin aspart (NOVOLOG) 100 UNIT/ML injection Inject 30 Units into the skin 3 (three) times daily before meals.     Historical Provider, MD  insulin glargine (LANTUS) 100 UNIT/ML injection Inject 100-120 Units into  the skin 2 (two) times daily. 120 units every morning and 100 units at night. 09/11/13   Vassie Loll, MD  meloxicam (MOBIC) 15 MG tablet Take 1 tablet (15 mg total) by mouth daily. 06/28/14   Max T Hyatt, DPM  metFORMIN (GLUCOPHAGE) 850 MG tablet Take 850 mg by mouth 2 (two) times daily with a meal.    Historical Provider, MD  methylPREDNISolone (MEDROL DOSEPAK) 4 MG tablet 21 tabs 6po-decrease by 1 tab per day over 5 days 01/06/15   Rolland Porter, MD  omeprazole (PRILOSEC) 40 MG capsule Take 1 capsule (40 mg total) by mouth 2 (two) times daily. 09/11/13   Vassie Loll, MD  Oxycodone HCl 20 MG TABS Take 1 tablet by mouth 5 (five) times daily as needed (pain.).    Historical Provider, MD  oxyCODONE-acetaminophen (PERCOCET/ROXICET) 5-325 MG per tablet Take 1-2 tablets by mouth every 6 (six) hours as needed for severe pain. 12/31/14   Renne Crigler, PA-C  QUEtiapine (SEROQUEL XR) 300 MG 24 hr tablet Take 300 mg by mouth at bedtime.    Historical Provider, MD  valsartan-hydrochlorothiazide (DIOVAN-HCT) 320-25 MG per tablet Take 1 tablet by mouth daily.    Historical Provider, MD   BP 143/97 mmHg  Pulse 99  Temp(Src) 97.9 F (36.6 C) (Oral)  Resp 20  Ht  (1.6 m)  Wt 176 lb 8 oz (80.06 kg)  BMI 31.27 kg/m2  SpO2 99% Physical Exam  Constitutional: She is oriented to person, place, and time. She appears well-developed and well-nourished. She appears distressed.  HENT:  Head: Normocephalic and atraumatic.  Mouth/Throat:    Eyes: EOM are normal. Pupils are equal, round, and reactive to light.  Cardiovascular: Normal rate, regular rhythm, normal heart sounds and intact distal pulses.  Exam reveals no friction rub.   No murmur heard. Pulmonary/Chest: Effort normal and breath sounds normal. She has no wheezes. She has no rales.  Abdominal: Soft. Bowel sounds are normal. She exhibits no distension. There is tenderness in the right lower quadrant, suprapubic area and left lower quadrant. There is guarding. There is no rebound and no CVA tenderness.  Genitourinary: Vaginal discharge found.  Thick curd-like discharge codeine the labia minora, vaginal vault with erythema and tenderness to palpation  Musculoskeletal: Normal range of motion. She exhibits no tenderness.  No edema  Neurological: She is alert and oriented to person, place, and time. No cranial nerve deficit.  Skin: Skin is warm and dry. No rash noted.  Psychiatric: She has a normal mood and affect. Her behavior is normal.  Nursing note and vitals reviewed.   ED Course  Procedures  (including critical care time) Labs Review Labs Reviewed  CBC WITH DIFFERENTIAL - Abnormal; Notable for the following:    WBC 10.9 (*)    MCV 77.6 (*)    Lymphs Abs 5.1 (*)    All other components within normal limits  CBG MONITORING, ED - Abnormal; Notable for the following:    Glucose-Capillary 158 (*)    All other components within normal limits  CBG MONITORING, ED - Abnormal; Notable for the following:    Glucose-Capillary 146 (*)    All other components within normal limits  COMPREHENSIVE METABOLIC PANEL  LIPASE, BLOOD  URINALYSIS, ROUTINE W REFLEX MICROSCOPIC  I-STAT CG4 LACTIC ACID, ED    Imaging Review No results found.   EKG Interpretation None      MDM   Final diagnoses:  Lower abdominal pain  Vaginal candida  Patient presenting after being in the hospital at [facility] until this morning where she signed out AMA. Patient has recently had a peritonsillar abscess status post drainage amount on antibiotics. When she got to the beach this weekend she was vomiting and having diffuse abdominal tenderness. She was admitted there for renal insufficiency, UTI and sepsis. She states approximately 24 hours ago her IV stopped working and they were unable to get a new line. She was just sitting there and felt that she would be better off coming home. She still complains of diffuse abdominal tenderness with nausea but no vomiting currently. She is complaining of a sore throat however area that was incised looks well has some mild erythema but appears to be healing. She has no obstruction or difficulty swallowing or breathing.  She reports that they told her her kidneys were shutting down.  sHe has no diffuse edema and lungs are clear.  CBC with leukocytosis of 10.9 and normal hemoglobin. CMP today with the potassium is slightly low at 3.0 and a normal creatinine of 0.75 with normal LFTs. Lipase slightly elevated at 60 which may be related to the vomiting. UA pending. Lactic acid  within normal limits. Patient's blood pressure has been normal here and there is no signs of service or sepsis at this time.  7:19 PM [facility] labs are all within normal limits. UA without sign of UTI. Going back and patient was complaining of vaginal discharge and pain as well. On visual inspection patient has a severe yeast infection which is typical because she has been on a lot of antibiotics recently and is diabetic. Will treat with Diflucan. Patient feels significantly better after pain medicine. We'll by mouth challenge and if patient is feeling better we'll discharge  8:14 PM Pt eating and drinking and feeling better.  Pt d/ced home with diflucan  Gwyneth Sprout, MD 01/15/15 2014

## 2015-01-25 DIAGNOSIS — M545 Low back pain, unspecified: Secondary | ICD-10-CM | POA: Insufficient documentation

## 2015-03-16 ENCOUNTER — Encounter (HOSPITAL_COMMUNITY): Payer: Self-pay | Admitting: Emergency Medicine

## 2015-03-16 ENCOUNTER — Emergency Department (INDEPENDENT_AMBULATORY_CARE_PROVIDER_SITE_OTHER)
Admission: EM | Admit: 2015-03-16 | Discharge: 2015-03-16 | Disposition: A | Discharge status: Home / Self Care | Payer: Medicare Other | Source: Home / Self Care | Attending: Family Medicine | Admitting: Family Medicine

## 2015-03-16 ENCOUNTER — Emergency Department (INDEPENDENT_AMBULATORY_CARE_PROVIDER_SITE_OTHER): Payer: Medicare Other

## 2015-03-16 DIAGNOSIS — R509 Fever, unspecified: Secondary | ICD-10-CM

## 2015-03-16 DIAGNOSIS — J45901 Unspecified asthma with (acute) exacerbation: Secondary | ICD-10-CM

## 2015-03-16 MED ORDER — IPRATROPIUM-ALBUTEROL 0.5-2.5 (3) MG/3ML IN SOLN
RESPIRATORY_TRACT | Status: AC
  Filled 2015-03-16: qty 3

## 2015-03-16 MED ORDER — ALBUTEROL SULFATE HFA 108 (90 BASE) MCG/ACT IN AERS
INHALATION_SPRAY | RESPIRATORY_TRACT | Status: AC
  Filled 2015-03-16: qty 6.7

## 2015-03-16 MED ORDER — AEROCHAMBER PLUS FLO-VU LARGE MISC
1.0000 | Freq: Once | Status: AC
  Administered 2015-03-16: 1

## 2015-03-16 MED ORDER — METHYLPREDNISOLONE SODIUM SUCC 125 MG IJ SOLR
INTRAMUSCULAR | Status: AC
  Filled 2015-03-16: qty 2

## 2015-03-16 MED ORDER — PREDNISONE 50 MG PO TABS: ORAL_TABLET | ORAL | Status: DC

## 2015-03-16 MED ORDER — METHYLPREDNISOLONE SODIUM SUCC 125 MG IJ SOLR
125.0000 mg | Freq: Once | INTRAMUSCULAR | Status: AC
  Administered 2015-03-16: 125 mg via INTRAMUSCULAR

## 2015-03-16 MED ORDER — ALBUTEROL SULFATE HFA 108 (90 BASE) MCG/ACT IN AERS
2.0000 | INHALATION_SPRAY | Freq: Once | RESPIRATORY_TRACT | Status: AC
  Administered 2015-03-16: 2 via RESPIRATORY_TRACT

## 2015-03-16 NOTE — ED Provider Notes (Signed)
CSN: [id number]     Arrival date & time 03/16/15  1757 History   None    No chief complaint on file.  (Consider location/radiation/quality/duration/timing/severity/associated sxs/prior Treatment) HPI  Developed onset of wheezing and cough approximately 36 hours ago. Acutely worsened over the night last night. Unsure what triggered this episode. Albuterol 3 times a day without benefit. Patient has an MDI without a spacer at home. Endorses worsening cough, respiratory congestion, and subjective fevers. Symptoms are now constant and getting worse. Patient denies chest pain, lightheadedness, headache, abdominal pain, nausea, vomiting, diarrhea, dysuria, frequency. Patient feeling anxious.  Past Medical History  Diagnosis Date  . Bipolar disorder diagnosed early 104s  . Depression   . Diabetes mellitus type II 2010  . High blood pressure   . Menopause   . Asthma    Past Surgical History  Procedure Laterality Date  . Abdominal hysterectomy  2007  . Foot surgery Bilateral    Family History  Problem Relation Age of Onset  . Bipolar disorder Mother   . Bipolar disorder Son   . Depression Brother     schizoaffective d/o   History  Substance Use Topics  . Smoking status: Heavy Tobacco Smoker -- 0.50 packs/day for 20 years    Types: Cigarettes  . Smokeless tobacco: Never Used  . Alcohol Use: No   OB History    No data available     Review of Systems Per HPI with all other pertinent systems negative.   Allergies  Penicillins; Pregabalin; Sulfa antibiotics; and Latex  Home Medications   Prior to Admission medications   Medication Sig Start Date End Date Taking? Authorizing Provider  clindamycin (CLEOCIN) 300 MG capsule Take 1 capsule (300 mg total) by mouth every 6 (six) hours. 01/06/15   Rolland Porter, MD  diazepam (VALIUM) 10 MG tablet Take 10 mg by mouth every 8 (eight) hours as needed for anxiety. For anxiety    Leilani Able, MD  FLUoxetine (PROZAC) 10 MG capsule Take 10 mg by  mouth every morning.     Historical Provider, MD  HYDROcodone-acetaminophen (NORCO/VICODIN) 5-325 MG per tablet Take 2 tablets by mouth every 4 (four) hours as needed. 01/06/15   Rolland Porter, MD  insulin aspart (NOVOLOG) 100 UNIT/ML injection Inject 30 Units into the skin 3 (three) times daily before meals.     Historical Provider, MD  insulin glargine (LANTUS) 100 UNIT/ML injection Inject 100-120 Units into the skin 2 (two) times daily. 120 units every morning and 100 units at night. 09/11/13   Vassie Loll, MD  meloxicam (MOBIC) 15 MG tablet Take 1 tablet (15 mg total) by mouth daily. 06/28/14   Max T Hyatt, DPM  metFORMIN (GLUCOPHAGE) 850 MG tablet Take 850 mg by mouth 2 (two) times daily with a meal.    Historical Provider, MD  omeprazole (PRILOSEC) 40 MG capsule Take 1 capsule (40 mg total) by mouth 2 (two) times daily. 09/11/13   Vassie Loll, MD  Oxycodone HCl 20 MG TABS Take 1 tablet by mouth 5 (five) times daily as needed (pain.).    Historical Provider, MD  predniSONE (DELTASONE) 50 MG tablet Take daily with breakfast 03/16/15   Ozella Rocks, MD  QUEtiapine (SEROQUEL XR) 300 MG 24 hr tablet Take 300 mg by mouth at bedtime.    Historical Provider, MD  valsartan-hydrochlorothiazide (DIOVAN-HCT) 320-25 MG per tablet Take 1 tablet by mouth daily.    Historical Provider, MD   BP 171/110 mmHg  Pulse 112  Temp(Src)  101.3 F (38.5 C) (Oral)  Resp 24  SpO2 93% Physical Exam Physical Exam  Constitutional: oriented to person, place, and time. appears well-developed and well-nourished. No distress.  HENT:  Head: Normocephalic and atraumatic.  Eyes: EOMI. PERRL.  Neck: Normal range of motion.  Cardiovascular: RRR, no m/r/g, 2+ distal pulses,  Pulmonary/Chest: I will wheezing, good air movement but lungs tight. Intermittent rhonchi in the bases bilaterally. Increased work of breathing.  Abdominal: Soft. Bowel sounds are normal. NonTTP, no distension.  Musculoskeletal: Normal range of motion.  Non ttp, no effusion.  Neurological: alert and oriented to person, place, and time.  Skin: Skin is warm. No rash noted. non diaphoretic.  Psychiatric: normal mood and affect. behavior is normal. Judgment and thought content normal.    ED Course  Procedures (including critical care time) Labs Review Labs Reviewed - No data to display  Imaging Review Dg Chest 2 View  03/16/2015   CLINICAL DATA:  Shortness of breath and fever  EXAM: CHEST  2 VIEW  COMPARISON:  01/09/2014  FINDINGS: Cardiac shadow is mildly prominent. The lungs are well aerated bilaterally. No focal infiltrate or sizable effusion is seen. Minimal platelike scarring is again noted in the right lung base. No bony abnormality is seen.  IMPRESSION: No acute abnormality noted.   Electronically Signed   By: Alcide CleverMark  Lukens M.D.   On: 03/16/2015 19:17     MDM   1. Asthma exacerbation   2. Febrile illness     DuoNeb, Solu-Medrol 125 IM, MDI inhaler 2 puffs with a spacer all given to patient in the clinic with improvement in overall symptoms. Chest x-ray without evidence of pneumonia. Tylenol and ibuprofen fluids and rest for likely viral illness causing fever.   Precautions given and all questions answered  Shelly Flattenavid Merrell, MD Family Medicine 03/16/2015, 7:27 PM    Ozella Rocksavid J Merrell, MD 03/16/15 [phone]

## 2015-03-16 NOTE — Discharge Instructions (Signed)
You are suffering from an asthma attack. Please use your albuterol every 4 hours for the next 24-48 hours. Please take the steroids every morning with breakfast. Please use Tylenol and ibuprofen to help prevent your fevers as you also have a mild viral illness. Please go to the emergency room if you get worse. There was no sign of pneumonia on her x-ray.

## 2015-03-16 NOTE — ED Notes (Signed)
Cough, wheezing, sentences interrupted by coughing.  Onset yesterday

## 2015-04-11 NOTE — Pre-Procedure Instructions (Addendum)
Stephanie Thornton  04/11/2015   Your procedure is scheduled on:  Wednesday April 19, 2015 at 12:30 PM.  [facility].  Call this number if you have problems the morning of surgery: [phone]   Remember:   Do not eat food or drink liquids after midnight.   Take these medicines the morning of surgery with A SIP OF WATER: Diazepam (Valium) if needed, Fluoxetine (Prozac), Omeprazole (Prilosec), Oxycodone if needed, Venlafaxine (Effexor)   Do NOT take any diabetic medications the morning of your surgery   Please stop taking Meloxicam/Mobic, vitamins, herbal medications starting 04/14/15 including aspirin     No insulin am of surgery   Do not wear jewelry, make-up or nail polish.  Do not wear lotions, powders, or perfumes. You may NOT wear deodorant.  Do not shave 48 hours prior to surgery.   Do not bring valuables to the hospital.  [facility] is not responsible for any belongings or valuables.               Contacts, dentures or bridgework may not be worn into surgery.  Leave suitcase in the car. After surgery it may be brought to your room.  For patients admitted to the hospital, discharge time is determined by your treatment team.               Patients discharged the day of surgery will not be allowed to drive home.  Name and phone number of your driver:   Special Instructions:  Special Instructions: [place] - Preparing for Surgery  Before surgery, you can play an important role.  Because skin is not sterile, your skin needs to be as free of germs as possible.  You can reduce the number of germs on you skin by washing with CHG (chlorahexidine gluconate) soap before surgery.  CHG is an antiseptic cleaner which kills germs and bonds with the skin to continue killing germs even after washing.  Please DO NOT use if you have an allergy to CHG or antibacterial soaps.  If your skin becomes reddened/irritated stop using the CHG and inform your  nurse when you arrive at Short Stay.  Do not shave (including legs and underarms) for at least 48 hours prior to the first CHG shower.  You may shave your face.  Please follow these instructions carefully:   1.  Shower with CHG Soap the night before surgery and the morning of Surgery.  2.  If you choose to wash your hair, wash your hair first as usual with your normal shampoo.  3.  After you shampoo, rinse your hair and body thoroughly to remove the Shampoo.  4.  Use CHG as you would any other liquid soap.  You can apply chg directly  to the skin and wash gently with scrungie or a clean washcloth.  5.  Apply the CHG Soap to your body ONLY FROM THE NECK DOWN.  Do not use on open wounds or open sores.  Avoid contact with your eyes ears, mouth and genitals (private parts).  Wash genitals (private parts)       with your normal soap.  6.  Wash thoroughly, paying special attention to the area where your surgery will be performed.  7.  Thoroughly rinse your body with warm water from the neck down.  8.  DO NOT shower/wash with your normal soap after using and rinsing off the CHG Soap.  9.  Pat yourself dry with  a clean towel.            10.  Wear clean pajamas.            11.  Place clean sheets on your bed the night of your first shower and do not sleep with pets.  Day of Surgery  Do not apply any lotions/deodorants the morning of surgery.  Please wear clean clothes to the hospital/surgery center.   Please read over the following fact sheets that you were given: Pain Booklet, Coughing and Deep Breathing and Surgical Site Infection Prevention

## 2015-04-12 ENCOUNTER — Encounter (HOSPITAL_COMMUNITY)
Admission: RE | Admit: 2015-04-12 | Discharge: 2015-04-12 | Disposition: A | Discharge status: Home / Self Care | Payer: Medicare Other | Source: Ambulatory Visit | Attending: Otolaryngology | Admitting: Otolaryngology

## 2015-04-12 ENCOUNTER — Encounter (HOSPITAL_COMMUNITY): Payer: Self-pay

## 2015-04-12 DIAGNOSIS — J45909 Unspecified asthma, uncomplicated: Secondary | ICD-10-CM | POA: Diagnosis not present

## 2015-04-12 DIAGNOSIS — I10 Essential (primary) hypertension: Secondary | ICD-10-CM | POA: Insufficient documentation

## 2015-04-12 DIAGNOSIS — Z6832 Body mass index (BMI) 32.0-32.9, adult: Secondary | ICD-10-CM | POA: Diagnosis not present

## 2015-04-12 DIAGNOSIS — F319 Bipolar disorder, unspecified: Secondary | ICD-10-CM | POA: Insufficient documentation

## 2015-04-12 DIAGNOSIS — E119 Type 2 diabetes mellitus without complications: Secondary | ICD-10-CM | POA: Diagnosis not present

## 2015-04-12 DIAGNOSIS — Z01810 Encounter for preprocedural cardiovascular examination: Secondary | ICD-10-CM | POA: Diagnosis not present

## 2015-04-12 DIAGNOSIS — Z01812 Encounter for preprocedural laboratory examination: Secondary | ICD-10-CM | POA: Diagnosis not present

## 2015-04-12 DIAGNOSIS — F17200 Nicotine dependence, unspecified, uncomplicated: Secondary | ICD-10-CM | POA: Diagnosis not present

## 2015-04-12 HISTORY — DX: Gastro-esophageal reflux disease without esophagitis: K21.9

## 2015-04-12 HISTORY — DX: Unspecified osteoarthritis, unspecified site: M19.90

## 2015-04-12 HISTORY — DX: Personal history of other diseases of the digestive system: Z87.19

## 2015-04-12 LAB — BASIC METABOLIC PANEL
Anion gap: 11 (ref 5–15)
BUN: 5 mg/dL — ABNORMAL LOW (ref 6–23)
CO2: 23 mmol/L (ref 19–32)
Calcium: 8.9 mg/dL (ref 8.4–10.5)
Chloride: 103 mmol/L (ref 96–112)
Creatinine, Ser: 0.81 mg/dL (ref 0.50–1.10)
GFR calc Af Amer: >90 mL/min (ref >90)
GFR calc non Af Amer: 88 mL/min — ABNORMAL LOW (ref >90)
Glucose, Bld: 302 mg/dL — ABNORMAL HIGH (ref 70–99)
Potassium: 3.3 mmol/L — ABNORMAL LOW (ref 3.5–5.1)
Sodium: 137 mmol/L (ref 135–145)

## 2015-04-12 LAB — CBC
HCT: 39.3 % (ref 36.0–46.0)
Hemoglobin: 12.9 g/dL (ref 12.0–15.0)
MCH: 27.3 pg (ref 26.0–34.0)
MCHC: 32.8 g/dL (ref 30.0–36.0)
MCV: 83.1 fL (ref 78.0–100.0)
Platelets: 255 10*3/uL (ref 150–400)
RBC: 4.73 MIL/uL (ref 3.87–5.11)
RDW: 15.1 % (ref 11.5–15.5)
WBC: 11 10*3/uL — ABNORMAL HIGH (ref 4.0–10.5)

## 2015-04-12 NOTE — Progress Notes (Signed)
Office called for orders 

## 2015-04-13 ENCOUNTER — Encounter (HOSPITAL_COMMUNITY): Payer: Self-pay | Admitting: Emergency Medicine

## 2015-04-13 NOTE — Progress Notes (Signed)
Anesthesia Chart Review:  Pt is 44 year old female scheduled for tonsillectomy on 04/19/2015 with Dr. Jenne PaneBates.   PMH includes: HTN, DM, asthma, bipolar disorder. Current smoker. BMI 32.   Preoperative labs reviewed.  Glucose 302. Last HgbA1c in care everywhere 01/23/2015 was 13.  Chest x-ray 03/16/2015 reviewed. No acute abnormality noted.   EKG: NSR. Possible Anterior infarct, age undetermined- New since previous tracing. T wave abnormality, consider anterolateral ischemia- New since previous tracing.  Echo 09/10/2013: Left ventricle: The cavity size was normal. Wall thickness was increased in a pattern of mild LVH. Systolic function was normal. The estimated ejection fraction was in the range of 60% to 65%.   Discussed case with Dr. Michelle Piperssey. Pt will need cardiac and endocrinology clearance prior to surgery. Notified Tambra at Dr. Jenne PaneBates' office.   Rica Mastngela Ko Bardon, FNP-BC [facility]/Anesthesiology Phone: [phone] 04/13/2015 2:12 PM

## 2015-04-27 ENCOUNTER — Encounter: Payer: Self-pay | Admitting: Podiatry

## 2015-04-27 ENCOUNTER — Ambulatory Visit (INDEPENDENT_AMBULATORY_CARE_PROVIDER_SITE_OTHER): Payer: Medicare Other | Admitting: Podiatry

## 2015-04-27 VITALS — BP 144/101 | HR 107 | Resp 16

## 2015-04-27 DIAGNOSIS — M722 Plantar fascial fibromatosis: Secondary | ICD-10-CM

## 2015-04-27 DIAGNOSIS — L603 Nail dystrophy: Secondary | ICD-10-CM | POA: Diagnosis not present

## 2015-04-27 NOTE — Progress Notes (Signed)
She presents today with chief complaint of bilateral heel pain also should like to see about removing the toenail to her hallux left. She denies any changes in her past medical history medications allergies surgeries and social history. Until we specifically asked about her hemoglobin A1c she reveals then it is currently at an 11.2.  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 Pulses are strongly palpable bilateral. She also has pain on patient calcaneal tubercles bilateral. I see no signs of infection skin.  Assessment: Ingrown nail paronychia hallux left. Also plantar fasciitis chronic nature bilateral heel.  Plan: Discussed etiology pathology conservative versus surgical therapies injected the bilateral heels today the late the removal and matrixectomy of the hallux nail plate until her A1c is below and 8. I will follow up with her in 6 weeks at which time we will consider

## 2015-05-01 ENCOUNTER — Ambulatory Visit (INDEPENDENT_AMBULATORY_CARE_PROVIDER_SITE_OTHER): Payer: Medicare Other | Admitting: Cardiology

## 2015-05-01 ENCOUNTER — Encounter: Payer: Self-pay | Admitting: Cardiology

## 2015-05-01 VITALS — BP 160/110 | HR 99 | Ht 62.0 in | Wt 185.6 lb

## 2015-05-01 DIAGNOSIS — R079 Chest pain, unspecified: Secondary | ICD-10-CM

## 2015-05-01 DIAGNOSIS — Z01818 Encounter for other preprocedural examination: Secondary | ICD-10-CM | POA: Insufficient documentation

## 2015-05-01 DIAGNOSIS — R9431 Abnormal electrocardiogram [ECG] [EKG]: Secondary | ICD-10-CM | POA: Diagnosis not present

## 2015-05-01 DIAGNOSIS — I10 Essential (primary) hypertension: Secondary | ICD-10-CM | POA: Diagnosis not present

## 2015-05-01 HISTORY — DX: Essential (primary) hypertension: I10

## 2015-05-01 MED ORDER — AMLODIPINE BESYLATE 5 MG PO TABS: 5.0000 mg | ORAL_TABLET | Freq: Every day | ORAL | Status: DC

## 2015-05-01 NOTE — Progress Notes (Signed)
Cardiology Office Note   Date:  05/01/2015   ID:  Stephanie Thornton, DOB [birth year], MRN [id number]  PCP:  Stephanie CoombeMatthews, [place], DO    Chief Complaint  Patient presents with  . Chest Pain  . Abnormal ECG      History of Present Illness: Stephanie Thornton is a 44 y.o. female who presents for preoperative clearance for tonsillectomy.  She has a history of HTN, DM, asthma and OSA on CPAP.  She went for preadmission testing with anesthesia and was found to have possible anterior infarct on EKG and T wave abnormality and is now referred for cardiac clearance.  She has a history of 2D echo 09/10/2013 showing NSR with normal LVF and mild LVH.  She says that she has had intermittent chest pain that she describes as a heaviness that is off and on for a few hours and then goes away on its own.  It occurs with rest and exertion.  She will get SOB with the discomfort as well as nausea and diaphoresis.  This has been going on the past 6 months.  Her BP has not been under adequate control.  She has has some LE edema.      Past Medical History  Diagnosis Date  . Bipolar disorder diagnosed early 1690s  . Depression   . Diabetes mellitus type II 2010  . High blood pressure   . Menopause   . Asthma   . Sleep apnea     cpap over yr  . GERD (gastroesophageal reflux disease)   . History of hiatal hernia   . Arthritis   . Benign essential HTN 05/01/2015    Past Surgical History  Procedure Laterality Date  . Abdominal hysterectomy  2007  . Foot surgery Bilateral      Current Outpatient Prescriptions  Medication Sig Dispense Refill  . diazepam (VALIUM) 10 MG tablet Take 10 mg by mouth every 8 (eight) hours as needed for anxiety. For anxiety    . FLUoxetine (PROZAC) 10 MG capsule Take 10 mg by mouth every morning.     . insulin regular (NOVOLIN R,HUMULIN R) 100 units/mL injection Inject 28-32 Units into the skin 3 (three) times daily before meals. 28 units in the morning, 32 units midday, 28 units in  the evening    . meloxicam (MOBIC) 15 MG tablet Take 1 tablet (15 mg total) by mouth daily. 30 tablet 3  . metFORMIN (GLUCOPHAGE) 850 MG tablet Take 850 mg by mouth 2 (two) times daily with a meal.    . omeprazole (PRILOSEC) 40 MG capsule Take 1 capsule (40 mg total) by mouth 2 (two) times daily. 60 capsule 1  . Oxycodone HCl 20 MG TABS Take 1 tablet by mouth 5 (five) times daily as needed (pain.).    Marland Kitchen. predniSONE (DELTASONE) 50 MG tablet Take daily with breakfast 5 tablet 0  . QUEtiapine (SEROQUEL XR) 300 MG 24 hr tablet Take 300 mg by mouth at bedtime.    . valsartan-hydrochlorothiazide (DIOVAN-HCT) 320-25 MG per tablet Take 1 tablet by mouth daily.    Marland Kitchen. venlafaxine (EFFEXOR) 75 MG tablet Take 150 mg by mouth 2 (two) times daily.     No current facility-administered medications for this visit.    Allergies:   Tramadol; Penicillins; Pregabalin; Sulfa antibiotics; and Latex    Social History:  The patient  reports that she has been smoking Cigarettes.  She has a 20 pack-year smoking history. She has never used smokeless tobacco. She  reports that she does not drink alcohol or use illicit drugs.   Family History:  The patient's family history includes Bipolar disorder in her mother and son; CVA in her mother; Depression in her brother; Heart attack in her maternal aunt; Heart disease in her maternal aunt; Hypertension in her brother and sister.    ROS:  Please see the history of present illness.   Otherwise, review of systems are positive for none.   All other systems are reviewed and negative.    PHYSICAL EXAM: VS:  BP 160/110 mmHg  Pulse 99  Ht  (1.575 m)  Wt 185 lb 9.6 oz (84.188 kg)  BMI 33.94 kg/m2  SpO2 93% , BMI Body mass index is 33.94 kg/(m^2). GEN: Well nourished, well developed, in no acute distress HEENT: normal Neck: no JVD, carotid bruits, or masses Cardiac: RRR; no murmurs, rubs, or gallops,no edema  Respiratory:  clear to auscultation bilaterally, normal work of  breathing GI: soft, nontender, nondistended, + BS MS: no deformity or atrophy Skin: warm and dry, no rash Neuro:  Strength and sensation are intact Psych: euthymic mood, full affect   EKG:  EKG is not ordered today.    Recent Labs: 01/15/2015: ALT 19 04/12/2015: BUN 5*; Creatinine 0.81; Hemoglobin 12.9; Platelets 255; Potassium 3.3*; Sodium 137    Lipid Panel No results found for: CHOL, TRIG, HDL, CHOLHDL, VLDL, LDLCALC, LDLDIRECT    Wt Readings from Last 3 Encounters:  05/01/15 185 lb 9.6 oz (84.188 kg)  01/15/15 176 lb 8 oz (80.06 kg)  01/06/15 167 lb (75.751 kg)       ASSESSMENT AND PLAN:  1.  Abnormal EKG with anterior infarct and T wave abnormality in anterior and lateral leads.  She has been having exertional and nonexertional chest pressure for 6 months associated with SOB.  This may be due to poorly controlled HTN but need to rule out ischemia - I will get a Lexiscan myoview to assess for ischemia.   2.  Preoperative clearance for tonsillectomy 3.  HTN with poorly controlled BP. Continue Valsartan HCT.  Add amlodipine  daily.  I will have her come back in 1 week for a BP check.   4.  LVH on echo 2014 - repeat 2D echo 5.  Recurrent tonsillitis with tonsillar abcess   Current medicines are reviewed at length with the patient today.  The patient does not have concerns regarding medicines.  The following changes have been made:  Start amlodipine  daily  Labs/ tests ordered today include: see above assessment and plan No orders of the defined types were placed in this encounter.     Disposition:   FU with me PRN pending results of studies and BP check  Signed, Stephanie Reichert, MD  05/01/2015 10:42 AM    [facility] [address] [place], [place], [place]  [address] Phone: [phone]; Fax: [phone]

## 2015-05-01 NOTE — Patient Instructions (Addendum)
Medication Instructions:  Your physician has recommended you make the following change in your medication:  1) START AMLODIPINE 5 mg daily  Labwork: None  Testing/Procedures: Your physician has requested that you have an echocardiogram. Echocardiography is a painless test that uses sound waves to create images of your heart. It provides your doctor with information about the size and shape of your heart and how well your heart's chambers and valves are working. This procedure takes approximately one hour. There are no restrictions for this procedure.  Your physician has requested that you have a lexiscan myoview. For further information please visit [email or web address]. Please follow instruction sheet, as given.  Follow-Up: Your physician recommends that you schedule a follow-up appointment in ONE WEEK for a NURSE VISIT BLOOD PRESSURE CHECK.  Your physician recommends that you schedule a follow-up appointment AS NEEDED with Dr. Mayford Knifeurner pending your study results.  Any Other Special Instructions Will Be Listed Below (If Applicable).

## 2015-05-08 ENCOUNTER — Ambulatory Visit (INDEPENDENT_AMBULATORY_CARE_PROVIDER_SITE_OTHER): Payer: Medicare Other | Admitting: Nurse Practitioner

## 2015-05-08 ENCOUNTER — Ambulatory Visit (HOSPITAL_COMMUNITY): Admission: RE | Admit: 2015-05-08 | Payer: Medicare Other | Source: Ambulatory Visit | Admitting: Otolaryngology

## 2015-05-08 VITALS — BP 142/78 | HR 100 | Ht 62.0 in | Wt 184.8 lb

## 2015-05-08 DIAGNOSIS — I10 Essential (primary) hypertension: Secondary | ICD-10-CM | POA: Diagnosis not present

## 2015-05-08 DIAGNOSIS — R079 Chest pain, unspecified: Secondary | ICD-10-CM | POA: Diagnosis not present

## 2015-05-08 DIAGNOSIS — R9431 Abnormal electrocardiogram [ECG] [EKG]: Secondary | ICD-10-CM | POA: Diagnosis not present

## 2015-05-08 DIAGNOSIS — Z01818 Encounter for other preprocedural examination: Secondary | ICD-10-CM

## 2015-05-08 NOTE — Progress Notes (Signed)
1.) Reason for visit: Nurse visit/BP check; patient started Amlodipine 5 mg on 5/2  2.) Name of MD requesting visit: Dr. Mayford Knifeurner  3.) H&P: patient returns for nurse visit/bp check since starting Amlodipine 1 week ago; patient denies symptoms, states she is feeling well.  Patient is ambulatory in NAD and alert & oriented to person, place, time  4.) ROS related to problem: Patient denies complaints; BP today is 142/78, HR 100 bpm; patient states heart rate is normally 90's to 100's  5.) Assessment and plan per MD: advised to follow-up with Dr. Mayford Knifeurner as needed based on results of myoview and Echo which will be done next Monday, 5/16; I advised her our office will call with any additional advice from Dr. Mayford Knifeurner. patient ambulatory to d/c in NAD

## 2015-05-08 NOTE — Patient Instructions (Signed)
Medication Instructions:  Your physician recommends that you continue on your current medications as directed. Please refer to the Current Medication list given to you today.   Labwork: None  Testing/Procedures: None  Follow-Up: Your physician recommends that you schedule a follow-up appointment in: as needed with Dr. Mayford Knifeurner based on results of stress test and echocardiogram

## 2015-05-11 ENCOUNTER — Other Ambulatory Visit: Payer: Self-pay | Admitting: Cardiology

## 2015-05-11 ENCOUNTER — Telehealth (HOSPITAL_COMMUNITY): Payer: Self-pay

## 2015-05-11 DIAGNOSIS — R9431 Abnormal electrocardiogram [ECG] [EKG]: Secondary | ICD-10-CM

## 2015-05-11 DIAGNOSIS — R079 Chest pain, unspecified: Secondary | ICD-10-CM

## 2015-05-11 DIAGNOSIS — Z01818 Encounter for other preprocedural examination: Secondary | ICD-10-CM

## 2015-05-11 NOTE — Telephone Encounter (Signed)
Patient given detailed instructions per Myocardial Perfusion Study Information Sheet for test on 05-15-2015 at 9:30am., and arrive at 8:15am for Echo.Patient verbalized understanding. Randa EvensEdwards, Dekisha Mesmer A

## 2015-05-15 ENCOUNTER — Encounter (HOSPITAL_COMMUNITY): Payer: Medicare Other

## 2015-05-15 ENCOUNTER — Ambulatory Visit (HOSPITAL_COMMUNITY): Payer: Medicare Other | Attending: Cardiovascular Disease

## 2015-05-15 ENCOUNTER — Ambulatory Visit (HOSPITAL_BASED_OUTPATIENT_CLINIC_OR_DEPARTMENT_OTHER): Payer: Medicare Other

## 2015-05-15 ENCOUNTER — Other Ambulatory Visit: Payer: Self-pay

## 2015-05-15 DIAGNOSIS — R9431 Abnormal electrocardiogram [ECG] [EKG]: Secondary | ICD-10-CM

## 2015-05-15 DIAGNOSIS — Z01818 Encounter for other preprocedural examination: Secondary | ICD-10-CM

## 2015-05-15 DIAGNOSIS — R079 Chest pain, unspecified: Secondary | ICD-10-CM | POA: Diagnosis present

## 2015-05-15 DIAGNOSIS — I10 Essential (primary) hypertension: Secondary | ICD-10-CM | POA: Insufficient documentation

## 2015-05-15 DIAGNOSIS — Z794 Long term (current) use of insulin: Secondary | ICD-10-CM | POA: Diagnosis not present

## 2015-05-15 DIAGNOSIS — Z720 Tobacco use: Secondary | ICD-10-CM | POA: Diagnosis not present

## 2015-05-15 DIAGNOSIS — E119 Type 2 diabetes mellitus without complications: Secondary | ICD-10-CM | POA: Insufficient documentation

## 2015-05-15 LAB — MYOCARDIAL PERFUSION IMAGING
Estimated workload: 1 METS
LV dias vol: 58 mL
LV sys vol: 18 mL
Nuc Stress EF: 68 %
Peak HR: 109 {beats}/min
Percent of predicted max HR: 61 %
RATE: 0.41
Rest HR: 85 {beats}/min
SDS: 0
SRS: 2
SSS: 2
Stage 1 DBP: 72 mmHg
Stage 1 Grade: 0 %
Stage 1 HR: 89 {beats}/min
Stage 1 SBP: 103 mmHg
Stage 1 Speed: 0 mph
Stage 2 Grade: 0 %
Stage 2 HR: 90 {beats}/min
Stage 2 Speed: 0 mph
Stage 3 DBP: 80 mmHg
Stage 3 Grade: 0 %
Stage 3 HR: 100 {beats}/min
Stage 3 SBP: 111 mmHg
Stage 3 Speed: 0 mph
Stage 4 Grade: 0 %
Stage 4 HR: 109 {beats}/min
Stage 4 Speed: 0 mph
Stage 5 DBP: 70 mmHg
Stage 5 Grade: 0 %
Stage 5 HR: 104 {beats}/min
Stage 5 SBP: 98 mmHg
Stage 5 Speed: 0 mph
Stage 6 Grade: 0 %
Stage 6 HR: 98 {beats}/min
Stage 6 Speed: 0 mph
TID: 0.89

## 2015-05-15 MED ORDER — TECHNETIUM TC 99M SESTAMIBI GENERIC - CARDIOLITE
33.0000 | Freq: Once | INTRAVENOUS | Status: AC | PRN
  Administered 2015-05-15: 33 via INTRAVENOUS

## 2015-05-15 MED ORDER — TECHNETIUM TC 99M SESTAMIBI GENERIC - CARDIOLITE
11.0000 | Freq: Once | INTRAVENOUS | Status: AC | PRN
  Administered 2015-05-15: 11 via INTRAVENOUS

## 2015-05-15 MED ORDER — REGADENOSON 0.4 MG/5ML IV SOLN
0.4000 mg | Freq: Once | INTRAVENOUS | Status: AC
  Administered 2015-05-15: 0.4 mg via INTRAVENOUS

## 2015-05-16 NOTE — Progress Notes (Addendum)
Anesthesia Note: Pt is 44 year old female scheduled for tonsillectomy on 05/24/15 by Dr. Jenne PaneBates.  Surgery was initially scheduled for 04/19/2015, but was postponed due to need for endocrinology and cardiology clearances which have now been obtained.   PMH includes: HTN, DM2, OSA with CPAP, GERD, hiatal hernia, asthma, bipolar disorder. Current smoker. BMI 34. PCP is Dr. Everrett Coombeody Matthews.   Meds include albuterol, amlodipine, diazepam, fluoxetine, furosemide, Humulin U-500 insulin, metformin, omeprazole, oxycodone, quetiapine, valsartan-HCTZ, venlafaxine.   Endocrinologist is Dr. Crista CurbMatthew Levy [phone]). He saw her in follow-up on 04/24/15.  He felt her overall glycemic control had improved since switching her to Humulin U-500. (He increased her breakfast U-500 from 120 units (0.1124mL) to 140 units (0.8428mL). Her lunch U-500 dose will remain at 160 units (0.7132mL). Her supper dose will be lowered from 120 units (0.5024mL) to 100 units (0.1520mL). She is tolerating Metformin ER 750mg  TID-AC.) In regards to her scheduled tonsillectomy, "[name] has been recommended to have tonsillectomy for recurrent infections. These infections may be the main contributor to her severe insulin resistance. She is cleared from a diabetes standpoint to have surgery. I did recommend holding her diabetes medications the morning of the surgery while fasting to avoid hypoglycemia. Her blood sugars will need to be monitored very closely after surgery especially if her insulin requirements become lower." (See Care Everywhere)  Cardiologist is Dr. Armanda Magicraci Turner.  Following a recent stress and echo (see below), Dr. Mayford Knifeurner wrote, "patient is at low risk from cardiac standpoint for tonsillectomy. OK to proceed."  04/12/15 EKG: NSR. Possible Anterior infarct, age undetermined- New since previous tracing. T wave abnormality, consider anterolateral ischemia- New since previous tracing.  05/15/15 Echo: - Left ventricle: The cavity size was normal.  Wall thickness was increased in a pattern of mild LVH. Systolic function was vigorous. The estimated ejection fraction was in the range of 65% to 70%. Wall motion was normal; there were no regional wall motion abnormalities. Doppler parameters are consistent with abnormal left ventricular relaxation (grade 1 diastolic dysfunction). The E/e&' ratio is between 8-15, suggesting indeterminate LV filling pressure. - Left atrium: The atrium was normal in size. Impressions: Compared to the prior echo in 2014, there are few changes. The EF is higher in this study.  05/15/15 Nuclear stress test: The study is normal. This is a low risk study. The left ventricular ejection fraction is hyperdynamic (>65%).   Chest x-ray 03/16/2015 reviewed. No acute abnormality noted.   Pre-operative labs noted.  A1C on 04/24/15 was 11.8 (Care Everywhere)--insulin was adjusted since then.  I talked with patient. She confirms she is on Humulin U-500, but says she is now taking 0.28 mL (140 Units) at breakfast, 0.32 mL (160 Units) at lunch, and 0.28 mL (140 Units) at supper (increasead from 0.20 mL for hyperglycemia per patient). She reports her fasting glucose levels are running around 90, but post-prandial afternoon-evening glucose readings are up to 300. She ate breakfast around 9:30 AM this morning (eggs, grits, bacon, biscuit). CBG at PAT was 279.   She reports Dr. Shawnee KnappLevy verbally instructed her to take 1/2 her supper time insulin dose the night before surgery and to take 1/2 of her morning insulin dose only if her fasting glucose was > 200. (I did point out to her that Dr. Lacie DraftLevy's office note stated not to take any DM meds on the morning, so she could clarify with him if needed.) She typically wakes up at 9:00 AM and fasting sugar is ~ 90.  She  is scheduled to arrive in [place] at 10:00 AM, but can arrive early if needed and unless her fasting is > 250, it may be safer for us to treat her here since she will be NPO.   She was also encouraged to follow-up with Dr. Lacie DraftLevy's office regarding post-operative insulin management since her PO intake will likely be less for a few weeks.  She said Dr. Jenne PaneBates said plans for overnight admission would depend on how she did in PACU.    She will get a fasting CBG on arrival the morning of surgery.  If results are acceptable and otherwise no acute changes then I would anticipate that she could proceed as planned.  Velna Ochsllison Baneen Wieseler, PA-C [facility]Anesthesiology Phone [phone] 05/17/2015 12:38 PM

## 2015-05-17 ENCOUNTER — Encounter (HOSPITAL_COMMUNITY): Payer: Self-pay

## 2015-05-17 ENCOUNTER — Other Ambulatory Visit (HOSPITAL_COMMUNITY): Payer: Self-pay | Admitting: Otolaryngology

## 2015-05-17 ENCOUNTER — Encounter (HOSPITAL_COMMUNITY)
Admission: RE | Admit: 2015-05-17 | Discharge: 2015-05-17 | Disposition: A | Discharge status: Home / Self Care | Payer: Medicare Other | Source: Ambulatory Visit | Attending: Otolaryngology | Admitting: Otolaryngology

## 2015-05-17 ENCOUNTER — Other Ambulatory Visit (HOSPITAL_COMMUNITY): Payer: Self-pay

## 2015-05-17 DIAGNOSIS — K219 Gastro-esophageal reflux disease without esophagitis: Secondary | ICD-10-CM | POA: Diagnosis not present

## 2015-05-17 DIAGNOSIS — J45909 Unspecified asthma, uncomplicated: Secondary | ICD-10-CM | POA: Diagnosis not present

## 2015-05-17 DIAGNOSIS — Z01812 Encounter for preprocedural laboratory examination: Secondary | ICD-10-CM | POA: Diagnosis not present

## 2015-05-17 DIAGNOSIS — Z01818 Encounter for other preprocedural examination: Secondary | ICD-10-CM | POA: Insufficient documentation

## 2015-05-17 DIAGNOSIS — G4733 Obstructive sleep apnea (adult) (pediatric): Secondary | ICD-10-CM | POA: Diagnosis not present

## 2015-05-17 DIAGNOSIS — Z794 Long term (current) use of insulin: Secondary | ICD-10-CM | POA: Diagnosis not present

## 2015-05-17 DIAGNOSIS — F17210 Nicotine dependence, cigarettes, uncomplicated: Secondary | ICD-10-CM | POA: Diagnosis not present

## 2015-05-17 DIAGNOSIS — Z79899 Other long term (current) drug therapy: Secondary | ICD-10-CM | POA: Insufficient documentation

## 2015-05-17 DIAGNOSIS — E119 Type 2 diabetes mellitus without complications: Secondary | ICD-10-CM | POA: Diagnosis not present

## 2015-05-17 DIAGNOSIS — I10 Essential (primary) hypertension: Secondary | ICD-10-CM | POA: Insufficient documentation

## 2015-05-17 LAB — CBC
HCT: 39.7 % (ref 36.0–46.0)
Hemoglobin: 13.2 g/dL (ref 12.0–15.0)
MCH: 27 pg (ref 26.0–34.0)
MCHC: 33.2 g/dL (ref 30.0–36.0)
MCV: 81.4 fL (ref 78.0–100.0)
Platelets: 265 10*3/uL (ref 150–400)
RBC: 4.88 MIL/uL (ref 3.87–5.11)
RDW: 15.3 % (ref 11.5–15.5)
WBC: 10.1 10*3/uL (ref 4.0–10.5)

## 2015-05-17 LAB — BASIC METABOLIC PANEL
Anion gap: 9 (ref 5–15)
BUN: 12 mg/dL (ref 6–20)
CO2: 28 mmol/L (ref 22–32)
Calcium: 9 mg/dL (ref 8.9–10.3)
Chloride: 99 mmol/L — ABNORMAL LOW (ref 101–111)
Creatinine, Ser: 0.89 mg/dL (ref 0.44–1.00)
GFR calc Af Amer: >60 mL/min (ref >60)
GFR calc non Af Amer: >60 mL/min (ref >60)
Glucose, Bld: 261 mg/dL — ABNORMAL HIGH (ref 65–99)
Potassium: 3.2 mmol/L — ABNORMAL LOW (ref 3.5–5.1)
Sodium: 136 mmol/L (ref 135–145)

## 2015-05-17 LAB — GLUCOSE, CAPILLARY: Glucose-Capillary: 279 mg/dL — ABNORMAL HIGH (ref 65–99)

## 2015-05-17 NOTE — Pre-Procedure Instructions (Addendum)
Jenne CampusDebbie R Butcher  05/17/2015   Your procedure is scheduled on:  May 24, 2015  [facility].  Call this number if you have problems the morning of surgery: [phone]    Monday-Friday 8A-4P  [phone]   Remember:   Do not eat food or drink liquids after midnight.   Take these medicines the morning of surgery with A SIP OF WATER: amLODipine (NORVASC),  FLUoxetine (PROZAC), omeprazole (PRILOSEC), venlafaxine (EFFEXOR)   IF NEEDED:albuterol (PROVENTIL HFA;VENTOLIN HFA) , diazepam (VALIUM) , Oxycodone   STOP meloxicam (MOBIC) AS OF TODAY   Do not wear jewelry, make-up or nail polish.  Do not wear lotions, powders, or perfumes. You may wear deodorant.  Do not shave 48 hours prior to surgery. Men may shave face and neck.  Do not bring valuables to the hospital.  [facility] is not responsible   for any belongings or valuables.               Contacts, dentures or bridgework may not be worn into surgery.  Leave suitcase in the car. After surgery it may be brought to your room.  For patients admitted to the hospital, discharge time is determined by your   treatment team.               Patients discharged the day of surgery will not be allowed to drive  home.  Name and phone number of your driver: FAMILY/FRIEND  Special Instructions: "PREPARING FOR SURGERY"   Please read over the following fact sheets that you were given: Pain Booklet, Coughing and Deep Breathing and Surgical Site Infection Prevention

## 2015-05-18 ENCOUNTER — Emergency Department (HOSPITAL_COMMUNITY)
Admission: EM | Admit: 2015-05-18 | Discharge: 2015-05-19 | Disposition: A | Discharge status: Home / Self Care | Payer: Medicare Other | Attending: Emergency Medicine | Admitting: Emergency Medicine

## 2015-05-18 ENCOUNTER — Encounter (HOSPITAL_COMMUNITY): Payer: Self-pay | Admitting: Emergency Medicine

## 2015-05-18 ENCOUNTER — Emergency Department (HOSPITAL_COMMUNITY): Payer: Medicare Other

## 2015-05-18 DIAGNOSIS — Z791 Long term (current) use of non-steroidal anti-inflammatories (NSAID): Secondary | ICD-10-CM | POA: Insufficient documentation

## 2015-05-18 DIAGNOSIS — Z794 Long term (current) use of insulin: Secondary | ICD-10-CM | POA: Diagnosis not present

## 2015-05-18 DIAGNOSIS — I10 Essential (primary) hypertension: Secondary | ICD-10-CM | POA: Insufficient documentation

## 2015-05-18 DIAGNOSIS — Z7952 Long term (current) use of systemic steroids: Secondary | ICD-10-CM | POA: Insufficient documentation

## 2015-05-18 DIAGNOSIS — Z780 Asymptomatic menopausal state: Secondary | ICD-10-CM | POA: Insufficient documentation

## 2015-05-18 DIAGNOSIS — G4730 Sleep apnea, unspecified: Secondary | ICD-10-CM | POA: Insufficient documentation

## 2015-05-18 DIAGNOSIS — E119 Type 2 diabetes mellitus without complications: Secondary | ICD-10-CM | POA: Diagnosis not present

## 2015-05-18 DIAGNOSIS — Z79899 Other long term (current) drug therapy: Secondary | ICD-10-CM | POA: Insufficient documentation

## 2015-05-18 DIAGNOSIS — F319 Bipolar disorder, unspecified: Secondary | ICD-10-CM | POA: Insufficient documentation

## 2015-05-18 DIAGNOSIS — Z9981 Dependence on supplemental oxygen: Secondary | ICD-10-CM | POA: Insufficient documentation

## 2015-05-18 DIAGNOSIS — M1990 Unspecified osteoarthritis, unspecified site: Secondary | ICD-10-CM | POA: Insufficient documentation

## 2015-05-18 DIAGNOSIS — J45909 Unspecified asthma, uncomplicated: Secondary | ICD-10-CM | POA: Insufficient documentation

## 2015-05-18 DIAGNOSIS — Z720 Tobacco use: Secondary | ICD-10-CM | POA: Diagnosis not present

## 2015-05-18 DIAGNOSIS — K219 Gastro-esophageal reflux disease without esophagitis: Secondary | ICD-10-CM | POA: Diagnosis not present

## 2015-05-18 DIAGNOSIS — G8929 Other chronic pain: Secondary | ICD-10-CM | POA: Insufficient documentation

## 2015-05-18 DIAGNOSIS — M79671 Pain in right foot: Secondary | ICD-10-CM

## 2015-05-18 DIAGNOSIS — Z91040 Latex allergy status: Secondary | ICD-10-CM | POA: Insufficient documentation

## 2015-05-18 DIAGNOSIS — Z880 Allergy status to penicillin: Secondary | ICD-10-CM | POA: Diagnosis not present

## 2015-05-18 NOTE — ED Notes (Signed)
Pt states she woke up yesterday with right ankle pain. States she did not do anything that she's aware of to have injured it, increasing pain into today. Pt states she does think it may be mildly swollen, no obvious swelling/redness/deformity observed in triage. Very tender to light palpation, able to move all feet/toes without problem, denies numbness/tingling to foot/ankle.

## 2015-05-18 NOTE — ED Provider Notes (Signed)
CSN: [id number]     Arrival date & time 05/18/15  2204 History  This chart was scribed for non-physician practitioner, Stephanie Thornton, working with Stephanie Lyons, MD by Stephanie Thornton, ED Scribe. This patient was seen in room WTR5/WTR5 and the patient's care was started at 10:48 PM.    Chief Complaint  Patient presents with  . Ankle Pain   The history is provided by the patient. No language interpreter was used.   HPI Comments: Stephanie Thornton is a 44 y.o. female with a history of DM who presents to the Emergency Department complaining of worsening right foot pain that started yesterday morning. Pt denies any known injury. She states that when she woke up yesterday when was experiencing pain on the top of her right foot that radiates up her right leg. She states she has taken no medications for her symptoms. Pt reports a history of neuropathy and states that she experiences pain and numbness. She denies any hx of rheumatoid arthritis. She denies fever or redness.   Past Medical History  Diagnosis Date  . Bipolar disorder diagnosed early 82s  . Depression   . Diabetes mellitus type II 2010  . High blood pressure   . Menopause   . Asthma   . Sleep apnea     cpap over yr  . GERD (gastroesophageal reflux disease)   . History of hiatal hernia   . Arthritis   . Benign essential HTN 05/01/2015  . Chronic back pain    Past Surgical History  Procedure Laterality Date  . Abdominal hysterectomy  2007  . Foot surgery Bilateral    Family History  Problem Relation Age of Onset  . Bipolar disorder Mother   . CVA Mother   . Bipolar disorder Son   . Depression Brother     schizoaffective d/o  . Hypertension Sister   . Heart disease Maternal Aunt   . Heart attack Maternal Aunt   . Hypertension Brother    History  Substance Use Topics  . Smoking status: Current Some Day Smoker -- 1.00 packs/day for 20 years    Types: Cigarettes  . Smokeless tobacco: Never Used  . Alcohol Use: No   OB  History    No data available     Review of Systems  Constitutional: Negative for fever.  Musculoskeletal: Positive for arthralgias.  Skin: Negative for color change.      Allergies  Lyrica; Tramadol; Penicillins; Sulfa antibiotics; and Latex  Home Medications   Prior to Admission medications   Medication Sig Start Date End Date Taking? Authorizing Provider  albuterol (PROVENTIL HFA;VENTOLIN HFA) 108 (90 BASE) MCG/ACT inhaler Inhale 2 puffs into the lungs every 4 (four) hours as needed for wheezing or shortness of breath (wheezing).    Yes Historical Provider, MD  amLODipine (NORVASC) 5 MG tablet Take 1 tablet (5 mg total) by mouth daily. 05/01/15  Yes Stephanie Reichert, MD  diazepam (VALIUM) 10 MG tablet Take 10 mg by mouth every 8 (eight) hours as needed for anxiety (anxiety). For anxiety   Yes Stephanie Able, MD  FLUoxetine (PROZAC) 10 MG capsule Take 10 mg by mouth every morning.    Yes Historical Provider, MD  furosemide (LASIX) 20 MG tablet Take 20 mg by mouth daily.   Yes Historical Provider, MD  insulin regular human CONCENTRATED (HUMULIN R) 500 UNIT/ML SOLN injection Inject 120-160 Units into the skin 3 (three) times daily before meals. Take 120 units in the morning before breakfast, 160  units before lunch, and 120 units before dinner.   Yes Historical Provider, MD  meloxicam (MOBIC) 15 MG tablet Take 1 tablet (15 mg total) by mouth daily. 06/28/14  Yes Stephanie Thornton, DPM  metFORMIN (GLUCOPHAGE) 850 MG tablet Take 850 mg by mouth 2 (two) times daily with a meal.   Yes Historical Provider, MD  NON FORMULARY Pt uses CPAP machine   Yes Historical Provider, MD  omeprazole (PRILOSEC) 40 MG capsule Take 1 capsule (40 mg total) by mouth 2 (two) times daily. 09/11/13  Yes Stephanie Lollarlos Madera, MD  Oxycodone HCl 20 MG TABS Take 1 tablet by mouth 5 (five) times daily as needed (pain.).   Yes Historical Provider, MD  QUEtiapine (SEROQUEL XR) 300 MG 24 hr tablet Take 600 mg by mouth at bedtime.    Yes  Historical Provider, MD  valsartan-hydrochlorothiazide (DIOVAN-HCT) 320-25 MG per tablet Take 1 tablet by mouth daily.   Yes Historical Provider, MD  venlafaxine (EFFEXOR) 75 MG tablet Take 150 mg by mouth 2 (two) times daily. 03/13/15 06/11/15 Yes Historical Provider, MD  predniSONE (DELTASONE) 50 MG tablet Take daily with breakfast Patient not taking: Reported on 05/16/2015 03/16/15   Stephanie Rocksavid J Merrell, MD   BP 140/89 mmHg  Pulse 89  Temp(Src) 97.9 F (36.6 C) (Oral)  Resp 18  SpO2 97%   Physical Exam  Constitutional: She is oriented to person, place, and time. She appears well-developed and well-nourished.  HENT:  Head: Normocephalic and atraumatic.  Eyes: Right eye exhibits no discharge. Left eye exhibits no discharge.  Neck: Neck supple. No tracheal deviation present.  Cardiovascular: Normal rate.   Pulmonary/Chest: Effort normal. No respiratory distress.  Abdominal: She exhibits no distension.  Musculoskeletal:  Mild swelling on proximal forefoot with focal tenderness. No ankle swelling or tenderness. Painful full ROM with digits. Distal pulse intact.   Neurological: She is alert and oriented to person, place, and time.  Skin: Skin is warm and dry.  Psychiatric: She has a normal mood and affect. Her behavior is normal.  Nursing note and vitals reviewed.   ED Course  Procedures    DIAGNOSTIC STUDIES: Oxygen Saturation is 97% on RA, normal by my interpretation.    COORDINATION OF CARE: 10:49 PM Discussed treatment plan with pt at bedside and pt agreed to plan.   Labs Review Labs Reviewed - No data to display  Imaging Review No results found.   EKG Interpretation None     Dg Foot Complete Right  05/18/2015   CLINICAL DATA:  Medial and dorsal proximal right foot pain for 2 days. Initial encounter.  EXAM: RIGHT FOOT COMPLETE - 3+ VIEW  COMPARISON:  Right foot radiographs performed 06/29/2012  FINDINGS: There is no evidence of fracture or dislocation. A pin is noted within  the distal first metacarpal, and there is fusion at the second proximal, middle and distal phalanges. There is no evidence of loosening of hardware.  The joint spaces are preserved. There is no evidence of talar subluxation; the subtalar joint is unremarkable in appearance.  No significant soft tissue abnormalities are seen.  IMPRESSION: No evidence of fracture or dislocation. Visualized hardware appears intact, without evidence of loosening.   Electronically Signed   By: Stephanie RaiderJeffery  Thornton M.D.   On: 05/18/2015 23:56     MDM   Final diagnoses:  None   1. Right foot pain  No bony abnormality of right foot to explain symptoms. Likely mild sprain injury requiring supportive care.   I personally  performed the services described in this documentation, which was scribed in my presence. The recorded information has been reviewed and is accurate.       Stephanie AnisShari Mazzy Santarelli, PA-C 05/19/15 2131  Stephanie Lyonsouglas Delo, MD 05/19/15 2221

## 2015-05-19 DIAGNOSIS — M79671 Pain in right foot: Secondary | ICD-10-CM | POA: Diagnosis not present

## 2015-05-19 MED ORDER — IBUPROFEN 800 MG PO TABS: 800.0000 mg | ORAL_TABLET | Freq: Three times a day (TID) | ORAL | Status: DC

## 2015-05-19 NOTE — Discharge Instructions (Signed)
Cryotherapy °Cryotherapy means treatment with cold. Ice or gel packs can be used to reduce both pain and swelling. Ice is the most helpful within the first 24 to 48 hours after an injury or flare-up from overusing a muscle or joint. Sprains, strains, spasms, burning pain, shooting pain, and aches can all be eased with ice. Ice can also be used when recovering from surgery. Ice is effective, has very few side effects, and is safe for most people to use. °PRECAUTIONS  °Ice is not a safe treatment option for people with: °· Raynaud phenomenon. This is a condition affecting small blood vessels in the extremities. Exposure to cold may cause your problems to return. °· Cold hypersensitivity. There are many forms of cold hypersensitivity, including: °¨ Cold urticaria. Red, itchy hives appear on the skin when the tissues begin to warm after being iced. °¨ Cold erythema. This is a red, itchy rash caused by exposure to cold. °¨ Cold hemoglobinuria. Red blood cells break down when the tissues begin to warm after being iced. The hemoglobin that carry oxygen are passed into the urine because they cannot combine with blood proteins fast enough. °· Numbness or altered sensitivity in the area being iced. °If you have any of the following conditions, do not use ice until you have discussed cryotherapy with your caregiver: °· Heart conditions, such as arrhythmia, angina, or chronic heart disease. °· High blood pressure. °· Healing wounds or open skin in the area being iced. °· Current infections. °· Rheumatoid arthritis. °· Poor circulation. °· Diabetes. °Ice slows the blood flow in the region it is applied. This is beneficial when trying to stop inflamed tissues from spreading irritating chemicals to surrounding tissues. However, if you expose your skin to cold temperatures for too long or without the proper protection, you can damage your skin or nerves. Watch for signs of skin damage due to cold. °HOME CARE INSTRUCTIONS °Follow  these tips to use ice and cold packs safely. °· Place a dry or damp towel between the ice and skin. A damp towel will cool the skin more quickly, so you may need to shorten the time that the ice is used. °· For a more rapid response, add gentle compression to the ice. °· Ice for no more than 10 to 20 minutes at a time. The bonier the area you are icing, the less time it will take to get the benefits of ice. °· Check your skin after 5 minutes to make sure there are no signs of a poor response to cold or skin damage. °· Rest 20 minutes or more between uses. °· Once your skin is numb, you can end your treatment. You can test numbness by very lightly touching your skin. The touch should be so light that you do not see the skin dimple from the pressure of your fingertip. When using ice, most people will feel these normal sensations in this order: cold, burning, aching, and numbness. °· Do not use ice on someone who cannot communicate their responses to pain, such as small children or people with dementia. °HOW TO MAKE AN ICE PACK °Ice packs are the most common way to use ice therapy. Other methods include ice massage, ice baths, and cryosprays. Muscle creams that cause a cold, tingly feeling do not offer the same benefits that ice offers and should not be used as a substitute unless recommended by your caregiver. °To make an ice pack, do one of the following: °· Place crushed ice or a   bag of frozen vegetables in a sealable plastic bag. Squeeze out the excess air. Place this bag inside another plastic bag. Slide the bag into a pillowcase or place a damp towel between your skin and the bag. °· Mix 3 parts water with 1 part rubbing alcohol. Freeze the mixture in a sealable plastic bag. When you remove the mixture from the freezer, it will be slushy. Squeeze out the excess air. Place this bag inside another plastic bag. Slide the bag into a pillowcase or place a damp towel between your skin and the bag. °SEEK MEDICAL CARE  IF: °· You develop white spots on your skin. This may give the skin a blotchy (mottled) appearance. °· Your skin turns blue or pale. °· Your skin becomes waxy or hard. °· Your swelling gets worse. °MAKE SURE YOU:  °· Understand these instructions. °· Will watch your condition. °· Will get help right away if you are not doing well or get worse. °Document Released: 08/12/2011 Document Revised: 05/02/2014 Document Reviewed: 08/12/2011 °Exit[employer]® Patient Information ©2015 [employer]. This information is not intended to replace advice given to you by your health care provider. Make sure you discuss any questions you have with your health care provider. ° °

## 2015-05-24 ENCOUNTER — Ambulatory Visit (HOSPITAL_COMMUNITY): Payer: Medicare Other | Admitting: Anesthesiology

## 2015-05-24 ENCOUNTER — Encounter (HOSPITAL_COMMUNITY)
Admission: RE | Disposition: A | Discharge status: Home / Self Care | Payer: Self-pay | Source: Ambulatory Visit | Attending: Otolaryngology

## 2015-05-24 ENCOUNTER — Ambulatory Visit (HOSPITAL_COMMUNITY): Payer: Medicare Other | Admitting: Vascular Surgery

## 2015-05-24 ENCOUNTER — Observation Stay (HOSPITAL_COMMUNITY)
Admission: RE | Admit: 2015-05-24 | Discharge: 2015-05-25 | Disposition: A | Discharge status: Home / Self Care | Payer: Medicare Other | Source: Ambulatory Visit | Attending: Otolaryngology | Admitting: Otolaryngology

## 2015-05-24 ENCOUNTER — Encounter (HOSPITAL_COMMUNITY): Admission: RE | Payer: Self-pay | Source: Ambulatory Visit

## 2015-05-24 ENCOUNTER — Encounter (HOSPITAL_COMMUNITY): Payer: Self-pay | Admitting: General Practice

## 2015-05-24 DIAGNOSIS — Z794 Long term (current) use of insulin: Secondary | ICD-10-CM | POA: Diagnosis not present

## 2015-05-24 DIAGNOSIS — F17210 Nicotine dependence, cigarettes, uncomplicated: Secondary | ICD-10-CM | POA: Diagnosis not present

## 2015-05-24 DIAGNOSIS — J45909 Unspecified asthma, uncomplicated: Secondary | ICD-10-CM | POA: Insufficient documentation

## 2015-05-24 DIAGNOSIS — F319 Bipolar disorder, unspecified: Secondary | ICD-10-CM | POA: Diagnosis not present

## 2015-05-24 DIAGNOSIS — I10 Essential (primary) hypertension: Secondary | ICD-10-CM | POA: Diagnosis not present

## 2015-05-24 DIAGNOSIS — J3501 Chronic tonsillitis: Secondary | ICD-10-CM | POA: Diagnosis present

## 2015-05-24 DIAGNOSIS — F32A Depression, unspecified: Secondary | ICD-10-CM | POA: Insufficient documentation

## 2015-05-24 DIAGNOSIS — E119 Type 2 diabetes mellitus without complications: Secondary | ICD-10-CM | POA: Diagnosis not present

## 2015-05-24 DIAGNOSIS — G8929 Other chronic pain: Secondary | ICD-10-CM | POA: Diagnosis not present

## 2015-05-24 DIAGNOSIS — M549 Dorsalgia, unspecified: Secondary | ICD-10-CM | POA: Diagnosis not present

## 2015-05-24 DIAGNOSIS — Z91040 Latex allergy status: Secondary | ICD-10-CM | POA: Diagnosis not present

## 2015-05-24 DIAGNOSIS — F419 Anxiety disorder, unspecified: Secondary | ICD-10-CM | POA: Diagnosis not present

## 2015-05-24 DIAGNOSIS — Z90710 Acquired absence of both cervix and uterus: Secondary | ICD-10-CM | POA: Diagnosis not present

## 2015-05-24 DIAGNOSIS — K219 Gastro-esophageal reflux disease without esophagitis: Secondary | ICD-10-CM | POA: Diagnosis not present

## 2015-05-24 DIAGNOSIS — Z79899 Other long term (current) drug therapy: Secondary | ICD-10-CM | POA: Diagnosis not present

## 2015-05-24 DIAGNOSIS — G4733 Obstructive sleep apnea (adult) (pediatric): Secondary | ICD-10-CM | POA: Insufficient documentation

## 2015-05-24 DIAGNOSIS — Z880 Allergy status to penicillin: Secondary | ICD-10-CM | POA: Insufficient documentation

## 2015-05-24 DIAGNOSIS — Z888 Allergy status to other drugs, medicaments and biological substances status: Secondary | ICD-10-CM | POA: Insufficient documentation

## 2015-05-24 DIAGNOSIS — J449 Chronic obstructive pulmonary disease, unspecified: Secondary | ICD-10-CM | POA: Diagnosis not present

## 2015-05-24 DIAGNOSIS — Z882 Allergy status to sulfonamides status: Secondary | ICD-10-CM | POA: Insufficient documentation

## 2015-05-24 DIAGNOSIS — F329 Major depressive disorder, single episode, unspecified: Secondary | ICD-10-CM | POA: Insufficient documentation

## 2015-05-24 DIAGNOSIS — E1165 Type 2 diabetes mellitus with hyperglycemia: Secondary | ICD-10-CM | POA: Diagnosis not present

## 2015-05-24 HISTORY — DX: Personal history of peptic ulcer disease: Z87.11

## 2015-05-24 HISTORY — DX: Obstructive sleep apnea (adult) (pediatric): G47.33

## 2015-05-24 HISTORY — DX: Anxiety disorder, unspecified: F41.9

## 2015-05-24 HISTORY — DX: Migraine, unspecified, not intractable, without status migrainosus: G43.909

## 2015-05-24 HISTORY — DX: Low back pain, unspecified: M54.50

## 2015-05-24 HISTORY — DX: Headache: R51

## 2015-05-24 HISTORY — PX: TONSILLECTOMY: SHX5217

## 2015-05-24 HISTORY — DX: Personal history of other diseases of the digestive system: Z87.19

## 2015-05-24 HISTORY — DX: Headache, unspecified: R51.9

## 2015-05-24 HISTORY — DX: Other chronic pain: G89.29

## 2015-05-24 HISTORY — DX: Obstructive sleep apnea (adult) (pediatric): Z99.89

## 2015-05-24 HISTORY — DX: Low back pain: M54.5

## 2015-05-24 HISTORY — DX: Unspecified chronic bronchitis: J42

## 2015-05-24 LAB — GLUCOSE, CAPILLARY
Glucose-Capillary: 101 mg/dL — ABNORMAL HIGH (ref 65–99)
Glucose-Capillary: 56 mg/dL — ABNORMAL LOW (ref 65–99)
Glucose-Capillary: 50 mg/dL — ABNORMAL LOW (ref 65–99)
Glucose-Capillary: 98 mg/dL (ref 65–99)
Glucose-Capillary: 64 mg/dL — ABNORMAL LOW (ref 65–99)
Glucose-Capillary: 166 mg/dL — ABNORMAL HIGH (ref 65–99)
Glucose-Capillary: 173 mg/dL — ABNORMAL HIGH (ref 65–99)

## 2015-05-24 LAB — MRSA PCR SCREENING: MRSA by PCR: POSITIVE — AB

## 2015-05-24 SURGERY — TONSILLECTOMY
Anesthesia: General | Site: Mouth | Laterality: Bilateral

## 2015-05-24 SURGERY — TONSILLECTOMY
Anesthesia: General

## 2015-05-24 MED ORDER — MIDAZOLAM HCL 2 MG/2ML IJ SOLN
INTRAMUSCULAR | Status: AC
  Filled 2015-05-24: qty 2

## 2015-05-24 MED ORDER — LIDOCAINE HCL (CARDIAC) 20 MG/ML IV SOLN
INTRAVENOUS | Status: AC
  Filled 2015-05-24: qty 5

## 2015-05-24 MED ORDER — ALBUTEROL SULFATE (2.5 MG/3ML) 0.083% IN NEBU: 3.0000 mL | INHALATION_SOLUTION | RESPIRATORY_TRACT | Status: DC | PRN

## 2015-05-24 MED ORDER — FENTANYL CITRATE (PF) 250 MCG/5ML IJ SOLN
INTRAMUSCULAR | Status: AC
  Filled 2015-05-24: qty 5

## 2015-05-24 MED ORDER — MORPHINE SULFATE 2 MG/ML IJ SOLN
2.0000 mg | INTRAMUSCULAR | Status: DC | PRN
Start: 2015-05-24 — End: 2015-05-25
  Administered 2015-05-25: 2 mg via INTRAVENOUS
  Filled 2015-05-24: qty 1

## 2015-05-24 MED ORDER — CHLORHEXIDINE GLUCONATE CLOTH 2 % EX PADS
6.0000 | MEDICATED_PAD | Freq: Every day | CUTANEOUS | Status: DC
  Administered 2015-05-25: 6 via TOPICAL

## 2015-05-24 MED ORDER — PANTOPRAZOLE SODIUM 40 MG PO TBEC: 40.0000 mg | DELAYED_RELEASE_TABLET | Freq: Every day | ORAL | Status: DC

## 2015-05-24 MED ORDER — FUROSEMIDE 20 MG PO TABS
20.0000 mg | ORAL_TABLET | Freq: Every day | ORAL | Status: DC
  Administered 2015-05-24 – 2015-05-25 (×2): 20 mg via ORAL
  Filled 2015-05-24 (×2): qty 1

## 2015-05-24 MED ORDER — MIDAZOLAM HCL 5 MG/5ML IJ SOLN
INTRAMUSCULAR | Status: DC | PRN
  Administered 2015-05-24: 2 mg via INTRAVENOUS

## 2015-05-24 MED ORDER — POTASSIUM CHLORIDE IN NACL 20-0.45 MEQ/L-% IV SOLN
INTRAVENOUS | Status: DC
  Administered 2015-05-24: 18:00:00 via INTRAVENOUS
  Filled 2015-05-24 (×5): qty 1000

## 2015-05-24 MED ORDER — HYDROCHLOROTHIAZIDE 25 MG PO TABS
25.0000 mg | ORAL_TABLET | Freq: Every day | ORAL | Status: DC
  Administered 2015-05-25: 25 mg via ORAL
  Filled 2015-05-24: qty 1

## 2015-05-24 MED ORDER — 0.9 % SODIUM CHLORIDE (POUR BTL) OPTIME
TOPICAL | Status: DC | PRN
  Administered 2015-05-24: 1000 mL

## 2015-05-24 MED ORDER — FLUOXETINE HCL 10 MG PO CAPS
10.0000 mg | ORAL_CAPSULE | Freq: Every morning | ORAL | Status: DC
  Administered 2015-05-24 – 2015-05-25 (×2): 10 mg via ORAL
  Filled 2015-05-24 (×2): qty 1

## 2015-05-24 MED ORDER — ONDANSETRON HCL 4 MG/2ML IJ SOLN
INTRAMUSCULAR | Status: AC
  Filled 2015-05-24: qty 2

## 2015-05-24 MED ORDER — IBUPROFEN 800 MG PO TABS
800.0000 mg | ORAL_TABLET | Freq: Three times a day (TID) | ORAL | Status: DC
  Administered 2015-05-25: 800 mg via ORAL
  Filled 2015-05-24 (×5): qty 1

## 2015-05-24 MED ORDER — NALOXONE HCL 0.4 MG/ML IJ SOLN
INTRAMUSCULAR | Status: AC
  Filled 2015-05-24: qty 1

## 2015-05-24 MED ORDER — IRBESARTAN 300 MG PO TABS
300.0000 mg | ORAL_TABLET | Freq: Every day | ORAL | Status: DC
  Administered 2015-05-25: 300 mg via ORAL
  Filled 2015-05-24: qty 1

## 2015-05-24 MED ORDER — HYDROCODONE-ACETAMINOPHEN 7.5-325 MG/15ML PO SOLN
10.0000 mL | ORAL | Status: DC | PRN
  Administered 2015-05-25: 15 mL via ORAL
  Filled 2015-05-24: qty 15

## 2015-05-24 MED ORDER — FENTANYL CITRATE (PF) 100 MCG/2ML IJ SOLN
INTRAMUSCULAR | Status: DC | PRN
  Administered 2015-05-24: 100 ug via INTRAVENOUS
  Administered 2015-05-24 (×2): 50 ug via INTRAVENOUS

## 2015-05-24 MED ORDER — LIDOCAINE HCL (CARDIAC) 20 MG/ML IV SOLN
INTRAVENOUS | Status: DC | PRN
  Administered 2015-05-24: 100 mg via INTRATRACHEAL
  Administered 2015-05-24: 100 mg via INTRAVENOUS

## 2015-05-24 MED ORDER — PROPOFOL 10 MG/ML IV BOLUS
INTRAVENOUS | Status: DC | PRN
  Administered 2015-05-24: 200 mg via INTRAVENOUS

## 2015-05-24 MED ORDER — HYDROMORPHONE HCL 1 MG/ML IJ SOLN: 0.5000 mg | INTRAMUSCULAR | Status: DC | PRN

## 2015-05-24 MED ORDER — DEXTROSE 50 % IV SOLN
INTRAVENOUS | Status: AC
  Administered 2015-05-24: 12.5 g via INTRAVENOUS
  Filled 2015-05-24: qty 50

## 2015-05-24 MED ORDER — ACETAMINOPHEN 10 MG/ML IV SOLN
1000.0000 mg | Freq: Four times a day (QID) | INTRAVENOUS | Status: DC
  Administered 2015-05-24: 1000 mg via INTRAVENOUS

## 2015-05-24 MED ORDER — SUCCINYLCHOLINE CHLORIDE 20 MG/ML IJ SOLN
INTRAMUSCULAR | Status: DC | PRN
  Administered 2015-05-24: 100 mg via INTRAVENOUS

## 2015-05-24 MED ORDER — METOPROLOL TARTRATE 1 MG/ML IV SOLN
5.0000 mg | Freq: Four times a day (QID) | INTRAVENOUS | Status: DC | PRN
  Administered 2015-05-24 – 2015-05-25 (×2): 5 mg via INTRAVENOUS
  Filled 2015-05-24 (×2): qty 5

## 2015-05-24 MED ORDER — DIAZEPAM 5 MG PO TABS: 5.0000 mg | ORAL_TABLET | Freq: Three times a day (TID) | ORAL | Status: DC | PRN

## 2015-05-24 MED ORDER — PROMETHAZINE HCL 25 MG PO TABS: 12.5000 mg | ORAL_TABLET | Freq: Four times a day (QID) | ORAL | Status: DC | PRN

## 2015-05-24 MED ORDER — MELOXICAM 15 MG PO TABS
15.0000 mg | ORAL_TABLET | Freq: Every day | ORAL | Status: DC
  Administered 2015-05-24 – 2015-05-25 (×2): 15 mg via ORAL
  Filled 2015-05-24 (×2): qty 1

## 2015-05-24 MED ORDER — INSULIN REGULAR HUMAN (CONC) 500 UNIT/ML ~~LOC~~ SOLN: 120.0000 [IU] | Freq: Three times a day (TID) | SUBCUTANEOUS | Status: DC

## 2015-05-24 MED ORDER — VENLAFAXINE HCL 75 MG PO TABS
150.0000 mg | ORAL_TABLET | Freq: Two times a day (BID) | ORAL | Status: DC
  Administered 2015-05-25: 150 mg via ORAL
  Filled 2015-05-24 (×3): qty 2

## 2015-05-24 MED ORDER — INSULIN ASPART 100 UNIT/ML ~~LOC~~ SOLN
0.0000 [IU] | Freq: Three times a day (TID) | SUBCUTANEOUS | Status: DC
  Administered 2015-05-25: 3 [IU] via SUBCUTANEOUS

## 2015-05-24 MED ORDER — PROMETHAZINE HCL 25 MG RE SUPP: 12.5000 mg | Freq: Four times a day (QID) | RECTAL | Status: DC | PRN

## 2015-05-24 MED ORDER — DEXTROSE 50 % IV SOLN
INTRAVENOUS | Status: AC
  Administered 2015-05-24: 25 mL
  Filled 2015-05-24: qty 50

## 2015-05-24 MED ORDER — DEXTROSE 50 % IV SOLN
INTRAVENOUS | Status: AC
  Administered 2015-05-24: 10 mL via INTRAVENOUS
  Filled 2015-05-24: qty 50

## 2015-05-24 MED ORDER — PHENYLEPHRINE 40 MCG/ML (10ML) SYRINGE FOR IV PUSH (FOR BLOOD PRESSURE SUPPORT)
PREFILLED_SYRINGE | INTRAVENOUS | Status: AC
Start: 2015-05-24 — End: 2015-05-24
  Filled 2015-05-24: qty 10

## 2015-05-24 MED ORDER — PHENYLEPHRINE HCL 10 MG/ML IJ SOLN
INTRAMUSCULAR | Status: DC | PRN
  Administered 2015-05-24 (×2): 80 ug via INTRAVENOUS

## 2015-05-24 MED ORDER — DEXAMETHASONE SODIUM PHOSPHATE 4 MG/ML IJ SOLN
INTRAMUSCULAR | Status: AC
  Filled 2015-05-24: qty 4

## 2015-05-24 MED ORDER — PROPOFOL 10 MG/ML IV BOLUS
INTRAVENOUS | Status: AC
  Filled 2015-05-24: qty 20

## 2015-05-24 MED ORDER — AMLODIPINE BESYLATE 5 MG PO TABS
5.0000 mg | ORAL_TABLET | Freq: Every day | ORAL | Status: DC
  Administered 2015-05-25: 5 mg via ORAL
  Filled 2015-05-24: qty 1

## 2015-05-24 MED ORDER — VALSARTAN-HYDROCHLOROTHIAZIDE 320-25 MG PO TABS: 1.0000 | ORAL_TABLET | Freq: Every day | ORAL | Status: DC

## 2015-05-24 MED ORDER — LIDOCAINE HCL (CARDIAC) 20 MG/ML IV SOLN
INTRAVENOUS | Status: AC
  Filled 2015-05-24: qty 10

## 2015-05-24 MED ORDER — HYDROMORPHONE HCL 1 MG/ML IJ SOLN
INTRAMUSCULAR | Status: AC
Start: 2015-05-24 — End: 2015-05-24
  Administered 2015-05-24: 0.25 mg via INTRAVENOUS
  Filled 2015-05-24: qty 1

## 2015-05-24 MED ORDER — DEXTROSE 50 % IV SOLN: 1.0000 | Freq: Once | INTRAVENOUS | Status: AC

## 2015-05-24 MED ORDER — METFORMIN HCL 850 MG PO TABS
850.0000 mg | ORAL_TABLET | Freq: Two times a day (BID) | ORAL | Status: DC
  Administered 2015-05-25: 850 mg via ORAL
  Filled 2015-05-24 (×4): qty 1

## 2015-05-24 MED ORDER — LACTATED RINGERS IV SOLN
INTRAVENOUS | Status: DC
  Administered 2015-05-24: 11:00:00 via INTRAVENOUS

## 2015-05-24 MED ORDER — DEXTROSE 50 % IV SOLN
12.5000 g | Freq: Once | INTRAVENOUS | Status: AC
  Administered 2015-05-24: 12.5 g via INTRAVENOUS

## 2015-05-24 MED ORDER — QUETIAPINE FUMARATE ER 300 MG PO TB24
600.0000 mg | ORAL_TABLET | Freq: Every day | ORAL | Status: DC
  Filled 2015-05-24 (×2): qty 2

## 2015-05-24 MED ORDER — MUPIROCIN 2 % EX OINT
1.0000 "application " | TOPICAL_OINTMENT | Freq: Two times a day (BID) | CUTANEOUS | Status: DC
  Administered 2015-05-25: 1 via NASAL
  Filled 2015-05-24: qty 22

## 2015-05-24 MED ORDER — ACETAMINOPHEN 10 MG/ML IV SOLN
INTRAVENOUS | Status: AC
  Administered 2015-05-24: 1000 mg via INTRAVENOUS
  Filled 2015-05-24: qty 100

## 2015-05-24 MED ORDER — ONDANSETRON HCL 4 MG/2ML IJ SOLN
4.0000 mg | Freq: Once | INTRAMUSCULAR | Status: AC | PRN
  Administered 2015-05-24: 4 mg via INTRAVENOUS

## 2015-05-24 MED ORDER — DEXAMETHASONE SODIUM PHOSPHATE 4 MG/ML IJ SOLN
INTRAMUSCULAR | Status: DC | PRN
  Administered 2015-05-24: 12 mg via INTRAVENOUS

## 2015-05-24 MED ORDER — ONDANSETRON HCL 4 MG/2ML IJ SOLN
INTRAMUSCULAR | Status: DC | PRN
  Administered 2015-05-24: 4 mg via INTRAVENOUS

## 2015-05-24 MED ORDER — DEXTROSE 50 % IV SOLN: 25.0000 mL | Freq: Once | INTRAVENOUS | Status: DC

## 2015-05-24 MED ORDER — HYDROMORPHONE HCL 1 MG/ML IJ SOLN
0.2500 mg | INTRAMUSCULAR | Status: DC | PRN
  Administered 2015-05-24: 0.25 mg via INTRAVENOUS
  Administered 2015-05-24: 0.5 mg via INTRAVENOUS

## 2015-05-24 SURGICAL SUPPLY — 30 items
BLADE SURG 15 STRL LF DISP TIS (BLADE) IMPLANT
BLADE SURG 15 STRL SS (BLADE)
CANISTER SUCTION 2500CC (MISCELLANEOUS) ×2 IMPLANT
CATH FOLEY LATEX FREE 14FR (CATHETERS) ×2
CATH FOLEY LF 14FR (CATHETERS) ×1 IMPLANT
CLEANER TIP ELECTROSURG 2X2 (MISCELLANEOUS) ×2 IMPLANT
COAGULATOR SUCT SWTCH 10FR 6 (ELECTROSURGICAL) ×2 IMPLANT
CRADLE DONUT ADULT HEAD (MISCELLANEOUS) IMPLANT
DRAPE PROXIMA HALF (DRAPES) IMPLANT
ELECT COATED BLADE 2.86 ST (ELECTRODE) ×2 IMPLANT
ELECT REM PT RETURN 9FT ADLT (ELECTROSURGICAL) ×2
ELECTRODE REM PT RTRN 9FT ADLT (ELECTROSURGICAL) IMPLANT
GAUZE SPONGE 4X4 16PLY XRAY LF (GAUZE/BANDAGES/DRESSINGS) ×2 IMPLANT
GLOVE SURG SS PI 7.5 STRL IVOR (GLOVE) ×2 IMPLANT
GOWN STRL REUS W/ TWL LRG LVL3 (GOWN DISPOSABLE) ×2 IMPLANT
GOWN STRL REUS W/TWL LRG LVL3 (GOWN DISPOSABLE) ×4
KIT BASIN OR (CUSTOM PROCEDURE TRAY) ×2 IMPLANT
KIT ROOM TURNOVER OR (KITS) ×2 IMPLANT
NDL HYPO 25GX1X1/2 BEV (NEEDLE) IMPLANT
NEEDLE HYPO 25GX1X1/2 BEV (NEEDLE) IMPLANT
NS IRRIG 1000ML POUR BTL (IV SOLUTION) ×2 IMPLANT
PACK SURGICAL SETUP 50X90 (CUSTOM PROCEDURE TRAY) ×2 IMPLANT
PAD ARMBOARD 7.5X6 YLW CONV (MISCELLANEOUS) ×3 IMPLANT
PENCIL BUTTON HOLSTER BLD 10FT (ELECTRODE) ×2 IMPLANT
SPONGE TONSIL 1 RF SGL (DISPOSABLE) ×1 IMPLANT
SYR BULB 3OZ (MISCELLANEOUS) ×2 IMPLANT
TUBE CONNECTING 12X1/4 (SUCTIONS) ×2 IMPLANT
TUBE SALEM SUMP 14F W/ARV (TUBING) ×1 IMPLANT
TUBE SALEM SUMP 16 FR W/ARV (TUBING) IMPLANT
YANKAUER SUCT BULB TIP NO VENT (SUCTIONS) ×2 IMPLANT

## 2015-05-24 NOTE — Transfer of Care (Signed)
Immediate Anesthesia Transfer of Care Note  Patient: Stephanie Thornton  Procedure(s) Performed: Procedure(s): TONSILLECTOMY (Bilateral)  Patient Location: PACU  Anesthesia Type:General  Level of Consciousness: sedated, patient cooperative and responds to stimulation  Airway & Oxygen Therapy: Patient Spontanous Breathing and Patient connected to face mask oxygen  Post-op Assessment: Report given to RN, Post -op Vital signs reviewed and stable and Patient moving all extremities  Post vital signs: Reviewed and stable  Last Vitals:  Filed Vitals:   05/24/15 1015  BP: 143/87  Pulse: 93  Temp: 37.2 C  Resp: 18    Complications: No apparent anesthesia complications

## 2015-05-24 NOTE — Progress Notes (Signed)
CPAP set up and placed patient on via FFM, auto titrate settings with 2 LPM O2 bleed in.  Patient tolerating well at this time.RN aware. RT will monitor as needed.

## 2015-05-24 NOTE — Anesthesia Procedure Notes (Signed)
Procedure Name: Intubation Date/Time: 05/24/2015 12:21 PM Performed by: Julian Reil Pre-anesthesia Checklist: Patient identified, Emergency Drugs available, Patient being monitored and Suction available Patient Re-evaluated:Patient Re-evaluated prior to inductionOxygen Delivery Method: Circle system utilized Preoxygenation: Pre-oxygenation with 100% oxygen Intubation Type: IV induction Ventilation: Mask ventilation without difficulty Laryngoscope Size: Mac and 3 Grade View: Grade II Tube type: Oral Tube size: 7.0 mm Number of attempts: 1 Airway Equipment and Method: Stylet and LTA kit utilized Placement Confirmation: ETT inserted through vocal cords under direct vision,  positive ETCO2 and breath sounds checked- equal and bilateral Secured at: 21 cm Tube secured with: Tape Dental Injury: Teeth and Oropharynx as per pre-operative assessment

## 2015-05-24 NOTE — Progress Notes (Signed)
Pt called to desk around 1140 to say that she felt her sugar might be dropping.  NT went to check her glucose, CBG, and it was 56. That was reported to me and I called Dr. Diamantina MonksGreg Smith and reported this to him.  Order obtained.  Report of CBG and 10cc of IV D50  given to Pt with the need to repeat the dose in 15-20 minutes given Diane, CRNA .  Diane repeated dose before taking patient back to OR.  Pt stated that she felt a little better after the initial dose of dextrose.

## 2015-05-24 NOTE — Brief Op Note (Signed)
05/24/2015  12:48 PM  PATIENT:  Stephanie Thornton  44 y.o. female  PRE-OPERATIVE DIAGNOSIS:  CHRONIC TONSILLITIS  POST-OPERATIVE DIAGNOSIS:  CHRONIC TONSILLITIS  PROCEDURE:  Procedure(s): TONSILLECTOMY (Bilateral)  SURGEON:  Surgeon(s) and Role:    * Christia Readingwight Travis Purk, MD - Primary  PHYSICIAN ASSISTANT:   ASSISTANTS: none   ANESTHESIA:   general  EBL:  Total I/O In: 800 [I.V.:800] Out: 100 [Blood:100]  BLOOD ADMINISTERED:none  DRAINS: none   LOCAL MEDICATIONS USED:  NONE  SPECIMEN:  Source of Specimen:  Right and left tonsils  DISPOSITION OF SPECIMEN:  PATHOLOGY  COUNTS:  YES  TOURNIQUET:  * No tourniquets in log *  DICTATION: .Other Dictation: Dictation Number [id number]  PLAN OF CARE: Admit for overnight observation  PATIENT DISPOSITION:  PACU - hemodynamically stable.   Delay start of Pharmacological VTE agent (>24hrs) due to surgical blood loss or risk of bleeding: yes

## 2015-05-24 NOTE — H&P (Signed)
Stephanie Thornton is an 44 y.o. female.   Chief Complaint: chronic tonsillitis HPI: 44 year old female with a couple of years of tonsil infections that have occurred about every other month.  She presents for surgical management.  She has sleep apnea.  Past Medical History  Diagnosis Date  . Bipolar disorder diagnosed early 2090s  . Depression   . Diabetes mellitus type II 2010  . High blood pressure   . Menopause   . Asthma   . Sleep apnea     cpap over yr  . GERD (gastroesophageal reflux disease)   . History of hiatal hernia   . Arthritis   . Benign essential HTN 05/01/2015  . Chronic back pain     Past Surgical History  Procedure Laterality Date  . Abdominal hysterectomy  2007  . Foot surgery Bilateral     Family History  Problem Relation Age of Onset  . Bipolar disorder Mother   . CVA Mother   . Bipolar disorder Son   . Depression Brother     schizoaffective d/o  . Hypertension Sister   . Heart disease Maternal Aunt   . Heart attack Maternal Aunt   . Hypertension Brother    Social History:  reports that she has been smoking Cigarettes.  She has a 20 pack-year smoking history. She has never used smokeless tobacco. She reports that she does not drink alcohol or use illicit drugs.  Allergies:  Allergies  Allergen Reactions  . Lyrica [Pregabalin] Shortness Of Breath and Rash  . Tramadol Shortness Of Breath    Had asthma attack  . Penicillins Hives  . Sulfa Antibiotics Hives  . Latex Rash    Generally with clear tape, bandaides    Medications Prior to Admission  Medication Sig Dispense Refill  . albuterol (PROVENTIL HFA;VENTOLIN HFA) 108 (90 BASE) MCG/ACT inhaler Inhale 2 puffs into the lungs every 4 (four) hours as needed for wheezing or shortness of breath (wheezing).     Marland Kitchen. amLODipine (NORVASC) 5 MG tablet Take 1 tablet (5 mg total) by mouth daily. 30 tablet 6  . diazepam (VALIUM) 10 MG tablet Take 10 mg by mouth every 8 (eight) hours as needed for anxiety  (anxiety). For anxiety    . FLUoxetine (PROZAC) 10 MG capsule Take 10 mg by mouth every morning.     . furosemide (LASIX) 20 MG tablet Take 20 mg by mouth daily.    Marland Kitchen. ibuprofen (ADVIL,MOTRIN) 800 MG tablet Take 1 tablet (800 mg total) by mouth 3 (three) times daily. 21 tablet 0  . insulin regular human CONCENTRATED (HUMULIN R) 500 UNIT/ML SOLN injection Inject 120-160 Units into the skin 3 (three) times daily before meals. Take 120 units in the morning before breakfast, 160 units before lunch, and 120 units before dinner.    . meloxicam (MOBIC) 15 MG tablet Take 1 tablet (15 mg total) by mouth daily. 30 tablet 3  . metFORMIN (GLUCOPHAGE) 850 MG tablet Take 850 mg by mouth 2 (two) times daily with a meal.    . NON FORMULARY Pt uses CPAP machine    . omeprazole (PRILOSEC) 40 MG capsule Take 1 capsule (40 mg total) by mouth 2 (two) times daily. 60 capsule 1  . Oxycodone HCl 20 MG TABS Take 1 tablet by mouth 5 (five) times daily as needed (pain.).    Marland Kitchen. QUEtiapine (SEROQUEL XR) 300 MG 24 hr tablet Take 600 mg by mouth at bedtime.     . valsartan-hydrochlorothiazide (DIOVAN-HCT)  320-25 MG per tablet Take 1 tablet by mouth daily.    Marland Kitchen venlafaxine (EFFEXOR) 75 MG tablet Take 150 mg by mouth 2 (two) times daily.    . predniSONE (DELTASONE) 50 MG tablet Take daily with breakfast (Patient not taking: Reported on 05/16/2015) 5 tablet 0    Results for orders placed or performed during the hospital encounter of 05/24/15 (from the past 48 hour(s))  Glucose, capillary     Status: Abnormal   Collection Time: 05/24/15 10:12 AM  Result Value Ref Range   Glucose-Capillary 101 (H) 65 - 99 mg/dL   No results found.  Review of Systems  All other systems reviewed and are negative.   Blood pressure 143/87, pulse 93, temperature 99 F (37.2 C), temperature source Oral, resp. rate 18, height  (1.575 m), weight 85.163 kg (187 lb 12 oz), SpO2 97 %. Physical Exam  Constitutional: She is oriented to person,  place, and time. She appears well-developed and well-nourished. No distress.  HENT:  Head: Normocephalic and atraumatic.  Right Ear: External ear normal.  Left Ear: External ear normal.  Nose: Nose normal.  Mouth/Throat: Oropharynx is clear and moist.  Tonsils 2-3+.  Eyes: Conjunctivae and EOM are normal. Pupils are equal, round, and reactive to light.  Neck: Normal range of motion. Neck supple.  Cardiovascular: Normal rate.   Respiratory: Effort normal.  Musculoskeletal: Normal range of motion.  Neurological: She is alert and oriented to person, place, and time. No cranial nerve deficit.  Skin: Skin is warm and dry.  Psychiatric: She has a normal mood and affect. Her behavior is normal. Judgment and thought content normal.     Assessment/Plan Chronic tonsillitis To OR for tonsillectomy.  Plan overnight observation due to sleep apnea.  Stephanie Thornton 05/24/2015, 12:05 PM

## 2015-05-24 NOTE — Anesthesia Preprocedure Evaluation (Signed)
Anesthesia Evaluation  Patient identified by MRN, date of birth, ID band Patient awake    Reviewed: Allergy & Precautions, NPO status , Patient's Chart, lab work & pertinent test results  Airway Mallampati: II  TM Distance: >3 FB     Dental   Pulmonary asthma , sleep apnea , Current Smoker,    Pulmonary exam normal       Cardiovascular hypertension, Normal cardiovascular exam    Neuro/Psych Bipolar Disorder    GI/Hepatic hiatal hernia, GERD-  ,  Endo/Other  diabetes, Type 2, Oral Hypoglycemic Agents, Insulin Dependent  Renal/GU      Musculoskeletal  (+) Arthritis -,   Abdominal   Peds  Hematology   Anesthesia Other Findings   Reproductive/Obstetrics                             Anesthesia Physical Anesthesia Plan  ASA: II  Anesthesia Plan: General   Post-op Pain Management:    Induction: Intravenous  Airway Management Planned: Oral ETT  Additional Equipment:   Intra-op Plan:   Post-operative Plan: Extubation in OR  Informed Consent: I have reviewed the patients History and Physical, chart, labs and discussed the procedure including the risks, benefits and alternatives for the proposed anesthesia with the patient or authorized representative who has indicated his/her understanding and acceptance.     Plan Discussed with: CRNA, Anesthesiologist and Surgeon  Anesthesia Plan Comments:         Anesthesia Quick Evaluation

## 2015-05-24 NOTE — Progress Notes (Signed)
Dr. Jearld FentonByers made aware of the CBG of 64, 1/2 amp of D50 was given.

## 2015-05-24 NOTE — Consult Note (Signed)
PCP:   Stephanie Coombe, DO   Reason for consult:  bp uncontrolled post op   HPI: 44 yo female h/o bipolar, htn, iddm is s/p tonsillectomy today in stepdown unit.  She is taking a liquid diet.  She is having some throat pain, and difficult to talk due to pain.  Her blood pressure has been elevated, she received her norvasc today several hours ago.  She also just received something for pain and her bp has come down.  She is insulin at over 20 units short acting tid.  She was given sensitive ssi today for an elevated blood sugar and her glucose went down to 60 range, 1/2 amp d50 was given.  No n/v noted.  No fevers.  We are asked to consult to help with management of her glucose and her blood pressure.  Review of Systems:  Positive and negative as per HPI otherwise all other systems are negative  Past Medical History: Past Medical History  Diagnosis Date  . Bipolar disorder diagnosed early 79s  . Depression   . Diabetes mellitus type II 2010  . High blood pressure   . Menopause   . Asthma   . Sleep apnea     cpap over yr  . GERD (gastroesophageal reflux disease)   . History of hiatal hernia   . Arthritis   . Benign essential HTN 05/01/2015  . Chronic back pain    Past Surgical History  Procedure Laterality Date  . Abdominal hysterectomy  2007  . Foot surgery Bilateral     Medications: Prior to Admission medications   Medication Sig Start Date End Date Taking? Authorizing Provider  albuterol (PROVENTIL HFA;VENTOLIN HFA) 108 (90 BASE) MCG/ACT inhaler Inhale 2 puffs into the lungs every 4 (four) hours as needed for wheezing or shortness of breath (wheezing).    Yes Historical Provider, MD  amLODipine (NORVASC) 5 MG tablet Take 1 tablet (5 mg total) by mouth daily. 05/01/15  Yes Stephanie Reichert, MD  diazepam (VALIUM) 10 MG tablet Take 10 mg by mouth every 8 (eight) hours as needed for anxiety (anxiety). For anxiety   Yes Stephanie Able, MD  FLUoxetine (PROZAC) 10 MG capsule Take 10 mg by  mouth every morning.    Yes Historical Provider, MD  furosemide (LASIX) 20 MG tablet Take 20 mg by mouth daily.   Yes Historical Provider, MD  ibuprofen (ADVIL,MOTRIN) 800 MG tablet Take 1 tablet (800 mg total) by mouth 3 (three) times daily. 05/19/15  Yes Stephanie Upstill, PA-C  insulin regular human CONCENTRATED (HUMULIN R) 500 UNIT/ML SOLN injection Inject 120-160 Units into the skin 3 (three) times daily before meals. Take 120 units in the morning before breakfast, 160 units before lunch, and 120 units before dinner.   Yes Historical Provider, MD  meloxicam (MOBIC) 15 MG tablet Take 1 tablet (15 mg total) by mouth daily. 06/28/14  Yes Stephanie Thornton, DPM  metFORMIN (GLUCOPHAGE) 850 MG tablet Take 850 mg by mouth 2 (two) times daily with a meal.   Yes Historical Provider, MD  NON FORMULARY Pt uses CPAP machine   Yes Historical Provider, MD  omeprazole (PRILOSEC) 40 MG capsule Take 1 capsule (40 mg total) by mouth 2 (two) times daily. 09/11/13  Yes Stephanie Loll, MD  Oxycodone HCl 20 MG TABS Take 1 tablet by mouth 5 (five) times daily as needed (pain.).   Yes Historical Provider, MD  QUEtiapine (SEROQUEL XR) 300 MG 24 hr tablet Take 600 mg by mouth at  bedtime.    Yes Historical Provider, MD  valsartan-hydrochlorothiazide (DIOVAN-HCT) 320-25 MG per tablet Take 1 tablet by mouth daily.   Yes Historical Provider, MD  venlafaxine (EFFEXOR) 75 MG tablet Take 150 mg by mouth 2 (two) times daily. 03/13/15 06/11/15 Yes Historical Provider, MD  predniSONE (DELTASONE) 50 MG tablet Take daily with breakfast Patient not taking: Reported on 05/16/2015 03/16/15   Stephanie Rocksavid J Merrell, MD    Allergies:   Allergies  Allergen Reactions  . Lyrica [Pregabalin] Shortness Of Breath and Rash  . Tramadol Shortness Of Breath    Had asthma attack  . Penicillins Hives  . Sulfa Antibiotics Hives  . Latex Rash    Generally with clear tape, bandaides    Social History:  reports that she has been smoking Cigarettes.  She has a 20  pack-year smoking history. She has never used smokeless tobacco. She reports that she does not drink alcohol or use illicit drugs.  Family History: Family History  Problem Relation Age of Onset  . Bipolar disorder Mother   . CVA Mother   . Bipolar disorder Son   . Depression Brother     schizoaffective d/o  . Hypertension Sister   . Heart disease Maternal Aunt   . Heart attack Maternal Aunt   . Hypertension Brother     Physical Exam: Filed Vitals:   05/24/15 1933 05/24/15 1945 05/24/15 2000 05/24/15 2017  BP: 200/116 195/109 157/102 158/99  Pulse: 81 84 81 80  Temp:    97.4 F (36.3 C)  TempSrc:    Axillary  Resp: 23 17 12 19   Height:      Weight:      SpO2: 98% 97% 99% 100%   General appearance: alert, cooperative and no distress Head: Normocephalic, without obvious abnormality, atraumatic Eyes: negative Nose: Nares normal. Septum midline. Mucosa normal. No drainage or sinus tenderness. Neck: no JVD and supple, symmetrical, trachea midline Lungs: clear to auscultation bilaterally Heart: regular rate and rhythm, S1, S2 normal, no murmur, click, rub or gallop Abdomen: soft, non-tender; bowel sounds normal; no masses,  no organomegaly Extremities: extremities normal, atraumatic, no cyanosis or edema Pulses: 2+ and symmetric Skin: Skin color, texture, turgor normal. No rashes or lesions Neurologic: Grossly normal    Labs on Admission:    preop labs reviewed   Radiological Exams on Admission:   Old record reviewed  Assessment/Plan  44 yo female with labile glucose and uncontrolled htn  Principal Problem:   Uncontrolled hypertension-  bp better with pain control  sbp was 200 earlier, now 150.  bp meds were restarted today.  Have also ordered a prn metoprolol iv for sbp over 180 dbp over 110.  Would treat pain first before providing prn iv meds for her hypertension.     Active Problems:   Bipolar disorder-  Resume home meds completed   DM (diabetes  mellitus)-  Advance diet as tolerated.  Cont low dose insulin coverage ssi for now.   Benign essential HTN-  As above   Chronic tonsillitis-  Per ENT team  Consult time total 50 minutes.  Will follow along with you concerning her bp control and glycemic control.  Please call us with any questions.    Demerius Podolak A 05/24/2015, 8:35 PM

## 2015-05-24 NOTE — OR Nursing (Signed)
Upon arrival to pacu, CBG 50.  MDA aware. 12.5g D50 given.

## 2015-05-24 NOTE — Progress Notes (Signed)
Hypoglycemic Event  CBG: 64  Treatment: 1/2 amp of D50  Symptoms: asymptomatic  Follow-up CBG: Time: 1900 CBG Result:166  Possible Reasons for Event: pt have not eaten yet just came from PACU  Comments/MD notified: hold insulin   Carma LeavenAguirre, Ana Hyacinth Mendoza  Remember to initiate Hypoglycemia Order Set & complete

## 2015-05-24 NOTE — Anesthesia Postprocedure Evaluation (Signed)
  Anesthesia Post-op Note  Patient: Stephanie Thornton  Procedure(s) Performed: Procedure(s): TONSILLECTOMY (Bilateral)  Patient Location: PACU  Anesthesia Type:General  Level of Consciousness: awake  Airway and Oxygen Therapy: Patient Spontanous Breathing  Post-op Pain: mild  Post-op Assessment: Post-op Vital signs reviewed  Post-op Vital Signs: Reviewed  Last Vitals:  Filed Vitals:   05/24/15 1330  BP: 178/100  Pulse: 94  Temp:   Resp: 25    Complications: No apparent anesthesia complications

## 2015-05-25 ENCOUNTER — Encounter (HOSPITAL_COMMUNITY): Payer: Self-pay | Admitting: Otolaryngology

## 2015-05-25 DIAGNOSIS — J3501 Chronic tonsillitis: Secondary | ICD-10-CM

## 2015-05-25 DIAGNOSIS — F3160 Bipolar disorder, current episode mixed, unspecified: Secondary | ICD-10-CM | POA: Diagnosis not present

## 2015-05-25 DIAGNOSIS — E119 Type 2 diabetes mellitus without complications: Secondary | ICD-10-CM | POA: Insufficient documentation

## 2015-05-25 DIAGNOSIS — I10 Essential (primary) hypertension: Secondary | ICD-10-CM

## 2015-05-25 DIAGNOSIS — F329 Major depressive disorder, single episode, unspecified: Secondary | ICD-10-CM | POA: Diagnosis not present

## 2015-05-25 DIAGNOSIS — F32A Depression, unspecified: Secondary | ICD-10-CM | POA: Insufficient documentation

## 2015-05-25 LAB — BASIC METABOLIC PANEL
Anion gap: 8 (ref 5–15)
BUN: 7 mg/dL (ref 6–20)
CO2: 27 mmol/L (ref 22–32)
Calcium: 9.1 mg/dL (ref 8.9–10.3)
Chloride: 102 mmol/L (ref 101–111)
Creatinine, Ser: 0.7 mg/dL (ref 0.44–1.00)
GFR calc Af Amer: >60 mL/min (ref >60)
GFR calc non Af Amer: >60 mL/min (ref >60)
Glucose, Bld: 174 mg/dL — ABNORMAL HIGH (ref 65–99)
Potassium: 4.3 mmol/L (ref 3.5–5.1)
Sodium: 137 mmol/L (ref 135–145)

## 2015-05-25 LAB — LIPID PANEL
Cholesterol: 268 mg/dL — ABNORMAL HIGH (ref 0–200)
HDL: 54 mg/dL (ref >40)
LDL Cholesterol: 188 mg/dL — ABNORMAL HIGH (ref 0–99)
Total CHOL/HDL Ratio: 5 RATIO
Triglycerides: 130 mg/dL (ref <150)
VLDL: 26 mg/dL (ref 0–40)

## 2015-05-25 LAB — GLUCOSE, CAPILLARY
Glucose-Capillary: 161 mg/dL — ABNORMAL HIGH (ref 65–99)
Glucose-Capillary: 141 mg/dL — ABNORMAL HIGH (ref 65–99)

## 2015-05-25 MED ORDER — HYDROCODONE-ACETAMINOPHEN 7.5-325 MG/15ML PO SOLN: 10.0000 mL | ORAL | Status: DC | PRN

## 2015-05-25 MED ORDER — AMLODIPINE BESYLATE 10 MG PO TABS: 10.0000 mg | ORAL_TABLET | Freq: Every day | ORAL | Status: DC

## 2015-05-25 MED ORDER — HYDROCHLOROTHIAZIDE 50 MG PO TABS: 50.0000 mg | ORAL_TABLET | Freq: Every day | ORAL | Status: DC

## 2015-05-25 MED ORDER — IRBESARTAN 300 MG PO TABS: 300.0000 mg | ORAL_TABLET | Freq: Every day | ORAL | Status: DC

## 2015-05-25 NOTE — Progress Notes (Signed)
Patient discharged home, self care. 

## 2015-05-25 NOTE — Op Note (Signed)
NAMLaurell Josephs:  Thornton, Stephanie              ACCOUNT NO.:  [id number]  MEDICAL RECORD NO.:  [id number]  LOCATION:  2C05C                        FACILITY:  [facility]  PHYSICIAN:  Antony Contraswight D Daysie Helf, MD     DATE OF BIRTH:  12/20/1971  DATE OF PROCEDURE:  05/24/2015 DATE OF DISCHARGE:                              OPERATIVE REPORT   PREOPERATIVE DIAGNOSIS:  Chronic tonsillitis and obstructive sleep apnea.  POSTOPERATIVE DIAGNOSIS:  Chronic tonsillitis and obstructive sleep apnea.  PROCEDURE:  Tonsillectomy.  SURGEON:  Antony Contraswight D Mahathi Pokorney, MD  ANESTHESIA:  General endotracheal anesthesia.  COMPLICATIONS:  None.  INDICATION:  The patient is a 44 year old female, who has a couple of year history of recurring tonsil infections occurring about every other month.  This has included strep throat as well as an abscess on the left side in January that required drainage.  She has had one other drainage procedure required.  She was hospitalized in February due to tonsil infection that resulted in sepsis.  With all these problems, tonsillectomy was recommended.  She also has a diagnosis of severe sleep apnea and wears CPAP at night.  FINDINGS:  Tonsils are 2 to 3+ in size.  There was a left peritonsillar incision scar.  DESCRIPTION OF PROCEDURE:  The patient was identified in the holding room.  Informed consent having been obtained after discussion of risks, benefits, alternatives, the patient was brought to the operative suite, put on the operating table in supine position.  Anesthesia was induced, and the patient was intubated by Anesthesia team without difficulty. The patient was given intravenous steroids during the case.  The eyes were taped and closed and bed was turned 90 degrees from anesthesia. Head wrap was placed around the patient's head and a Crowe-Davis retractor was inserted in the mouth and opened to view the oropharynx. This was placed in suspension on Mayo stand.  The right tonsil  was grasped with a curved Allis and retracted medially while a curvilinear incision was made along the anterior tonsillar pillar using Bovie electrocautery on a setting of 20.  Dissection continued subcapsular plane until the tonsil was removed.  The same procedure was then carried on the left side.  Tonsils were passed separately for pathology. Bleeding was controlled on both sides using suction cautery on a setting of 30.  After this was completed, the nose and throat were copiously irrigated with saline and a flexible catheter was passed down the esophagus to suction out the stomach and esophagus.  Retractor was taken out of suspension and removed from the patient's mouth.  She was turned back to Anesthesia for wake-up, was extubated, and moved to recovery room in stable condition.     Antony Contraswight D Makana Rostad, MD     DDB/MEDQ  D:  05/24/2015  T:  05/25/2015  Job:  [id number]

## 2015-05-25 NOTE — Consult Note (Signed)
[facility] Medical Consultation  TABITA CORBO ZOX:[id number] DOB: [birth year] DOA: 05/24/2015 PCP: Everrett Coombe, DO   Requesting physician: Dr.Dwight Jenne Pane (ENT) Date of consultation: 05/24/2015 Reason for consultation: Management of multiple medical problems  Impression/Recommendations Principal Problem:   Uncontrolled hypertension Active Problems:   Bipolar disorder   DM (diabetes mellitus)   Benign essential HTN   Chronic tonsillitis   Uncontrolled hypertension/Essential hypertension -Norvasc 10 mg daily  -HCTZ 50 mg daily -Irbesartan 300 mg daily -Continue furosemide 20 mg daily  Bipolar disorder/depression -Continue Seroquel 600 mg daily -Continue venlafaxine 150 mg BID -Continue Prozac 10 mg daily -Continue diazepam 5 mg TID PRN    DM (diabetes mellitus)- -Continue metformin 850 mg  BID; should not have any problems with hypoglycemia with metformin alone. -Would not recommend patient be discharged on insulin until her nutritional status improved -Advance diet as tolerated. -Hemoglobin A1c pending -Lipid panel pending   Chronic tonsillitis -Per surgical team; S/P tonsillectomy    I will followup again tomorrow. Please contact me if I can be of assistance in the meanwhile. Thank you for this consultation.  Chief Complaint: Chronic tonsillitis; admission for elective tonsillectomy  HPI:  44 year old female PMHx bipolar disorder, depression, diabetes type 2, essential hypertension asthma, sleep apnea on CPAP. Admitted for chronic tonsillitis and elective tonsillectomy    Review of symptoms The patient denies anorexia, fever, weight loss,, vision loss, decreased hearing, hoarseness, chest pain, syncope, dyspnea on exertion, peripheral edema, balance deficits, hemoptysis, abdominal pain, melena, hematochezia, severe indigestion/heartburn, hematuria, incontinence, genital sores, muscle weakness, suspicious skin lesions, transient blindness, difficulty  walking, depression, unusual weight change, abnormal bleeding, enlarged lymph nodes, angioedema, and breast masses.   Consultants:     Procedure/Significant Events: 5/26 Tonsillectomy.   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 Bipolar disorder diagnosed early 38s  . Depression   . High blood pressure   . Menopause   . Asthma   . GERD (gastroesophageal reflux disease)   . History of hiatal hernia   . Benign essential HTN 05/01/2015  . Chronic back pain   . Chronic bronchitis     "get it q yr"  . OSA on CPAP   . Diabetes mellitus type II 2010  . History of stomach ulcers   . Headache     "maybe 3 times/wk" (05/24/2015)  . Migraine     "1-2/wk" (05/24/2015)  . Arthritis     "knees, feet, back" (05/24/2015)  . Chronic lower back pain   . Anxiety    Past Surgical History  Procedure Laterality Date  . Abdominal hysterectomy  2007  . Bunionectomy Bilateral   . Tonsillectomy Bilateral 05/24/2015  . Carpal tunnel release Right   . Hammer toe surgery Left    Social History:  reports that she has been smoking Cigarettes.  She has a 30 pack-year smoking history. She has never used smokeless tobacco. She reports that she does not drink alcohol or use illicit drugs.  Allergies  Allergen Reactions  . Lyrica [Pregabalin] Shortness Of Breath and Rash  . Tramadol Shortness Of Breath    Had asthma attack  . Penicillins Hives  . Sulfa Antibiotics Hives  . Latex Rash    Generally with clear tape, bandaides   Family History  Problem Relation Age of Onset  . Bipolar disorder Mother   . CVA Mother   . Bipolar disorder Son   .  Depression Brother     schizoaffective d/o  . Hypertension Sister   . Heart disease Maternal Aunt   . Heart attack Maternal Aunt   . Hypertension Brother     Prior to Admission medications   Medication Sig Start Date End Date Taking? Authorizing Provider  albuterol  (PROVENTIL HFA;VENTOLIN HFA) 108 (90 BASE) MCG/ACT inhaler Inhale 2 puffs into the lungs every 4 (four) hours as needed for wheezing or shortness of breath (wheezing).    Yes Historical Provider, MD  amLODipine (NORVASC) 5 MG tablet Take 1 tablet (5 mg total) by mouth daily. 05/01/15  Yes Quintella Reichert, MD  diazepam (VALIUM) 10 MG tablet Take 10 mg by mouth every 8 (eight) hours as needed for anxiety (anxiety). For anxiety   Yes Leilani Able, MD  FLUoxetine (PROZAC) 10 MG capsule Take 10 mg by mouth every morning.    Yes Historical Provider, MD  furosemide (LASIX) 20 MG tablet Take 20 mg by mouth daily.   Yes Historical Provider, MD  ibuprofen (ADVIL,MOTRIN) 800 MG tablet Take 1 tablet (800 mg total) by mouth 3 (three) times daily. 05/19/15  Yes Shari Upstill, PA-C  insulin regular human CONCENTRATED (HUMULIN R) 500 UNIT/ML SOLN injection Inject 120-160 Units into the skin 3 (three) times daily before meals. Take 120 units in the morning before breakfast, 160 units before lunch, and 120 units before dinner.   Yes Historical Provider, MD  meloxicam (MOBIC) 15 MG tablet Take 1 tablet (15 mg total) by mouth daily. 06/28/14  Yes Max T Hyatt, DPM  metFORMIN (GLUCOPHAGE) 850 MG tablet Take 850 mg by mouth 2 (two) times daily with a meal.   Yes Historical Provider, MD  NON FORMULARY Pt uses CPAP machine   Yes Historical Provider, MD  omeprazole (PRILOSEC) 40 MG capsule Take 1 capsule (40 mg total) by mouth 2 (two) times daily. 09/11/13  Yes Vassie Loll, MD  Oxycodone HCl 20 MG TABS Take 1 tablet by mouth 5 (five) times daily as needed (pain.).   Yes Historical Provider, MD  QUEtiapine (SEROQUEL XR) 300 MG 24 hr tablet Take 600 mg by mouth at bedtime.    Yes Historical Provider, MD  valsartan-hydrochlorothiazide (DIOVAN-HCT) 320-25 MG per tablet Take 1 tablet by mouth daily.   Yes Historical Provider, MD  venlafaxine (EFFEXOR) 75 MG tablet Take 150 mg by mouth 2 (two) times daily. 03/13/15 06/11/15 Yes Historical  Provider, MD  predniSONE (DELTASONE) 50 MG tablet Take daily with breakfast Patient not taking: Reported on 05/16/2015 03/16/15   Ozella Rocks, MD   Physical Exam: Blood pressure 156/102, pulse 68, temperature 97.9 F (36.6 C), temperature source Oral, resp. rate 15, height  (1.626 m), weight 89 kg (196 lb 3.4 oz), SpO2 99 %. Filed Vitals:   05/25/15 0300 05/25/15 0400 05/25/15 0505 05/25/15 0600  BP: 166/104  178/108 156/102  Pulse: 67  74 68  Temp:  97.9 F (36.6 C)    TempSrc:  Oral    Resp: Height:      Weight:      SpO2: 96%  97% 99%     General:  A/O 4, states having some throat pain appropriate to just having tonsillectomy, positive swelling appropriate to tonsillectomy.  Eyes: Pupils equal round reactive to light and accommodation   ENT: Throat/erythematous, swelling appropriate   Neck: Tender to palpation  Cardiovascular: Regular rhythm and rate, negative murmurs rubs or gallops, normal S1/S2  Respiratory: Clear to auscultation  bilateral  Abdomen: Morbidly obese, nontender, nondistended, plus bowel sound   Labs on Admission:  Basic Metabolic Panel:  Recent Labs Lab 05/24/15 2340  NA 137  K 4.3  CL 102  CO2 27  GLUCOSE 174*  BUN 7  CREATININE 0.70  CALCIUM 9.1   Liver Function Tests: No results for input(s): AST, ALT, ALKPHOS, BILITOT, PROT, ALBUMIN in the last 168 hours. No results for input(s): LIPASE, AMYLASE in the last 168 hours. No results for input(s): AMMONIA in the last 168 hours. CBC: No results for input(s): WBC, NEUTROABS, HGB, HCT, MCV, PLT in the last 168 hours. Cardiac Enzymes: No results for input(s): CKTOTAL, CKMB, CKMBINDEX, TROPONINI in the last 168 hours. BNP: Invalid input(s): POCBNP CBG:  Recent Labs Lab 05/24/15 1308 05/24/15 1344 05/24/15 1745 05/24/15 1858 05/24/15 2129  GLUCAP 50* 98 64* 166* 173*    Radiological Exams on Admission: No results found.  EKG: N/A  Care during the described  time interval was provided by me .  I have reviewed this patient's available data, including medical history, events of note, physical examination, and all test results as part of my evaluation. I have personally reviewed and interpreted all radiology studies.  Time spent: 50 min  Drema DallasWOODS, CURTIS J [facility] Pager [phone]  If 7PM-7AM, please contact night-coverage [email or web address] Password [email or web address] 05/25/2015, 7:41 AM

## 2015-05-25 NOTE — Discharge Summary (Signed)
Physician Discharge Summary  Patient ID: Stephanie Thornton MRN: [id number] DOB/AGE: 43-Feb-1972 43 y.o.  Admit date: 05/24/2015 Discharge date: 05/25/2015  Admission Diagnoses: Chronic tonsillitis, HTN, DM, Bipolar  Discharge Diagnoses:  Principal Problem:   Uncontrolled hypertension Active Problems:   Bipolar disorder   DM (diabetes mellitus)   Benign essential HTN   Chronic tonsillitis   Discharged Condition: good  Hospital Course: 44 year old female with recurring tonsil infections including multiple peritonsillar abscesses presented for tonsillectomy.  See operative note.  She did well during the following night, observed in a step down unit with exception of labile hypertension and hypoglycemia.  Family practice was consulted and assisted with these issues.  By next morning, she appeared stable and was taking good po intake.  Pain is controlled.  She was felt stable for discharge pending final recommendations by family practice.  Consults: Family practice  Significant Diagnostic Studies: None  Treatments: surgery: tonsillectomy  Discharge Exam: Blood pressure 156/102, pulse 68, temperature 97.9 F (36.6 C), temperature source Oral, resp. rate 15, height 5\' 4"  (1.626 m), weight 89 kg (196 lb 3.4 oz), SpO2 99 %. General appearance: alert, cooperative and no distress Throat: no bleeding  Disposition: 01-Home or Self Care  Discharge Instructions    Diet - low sodium heart healthy    Complete by:  As directed      Discharge instructions    Complete by:  As directed   Drink plenty of fluids.  OK to eat soft foods.  Use prescribed pain medication regularly.  Can take Motrin in addition.  Do not take home oxycodone.  May use CPAP machine at night if able.  Call Dr. Jenne PaneBates with any significant bleeding, high fever, or inability to drink.     Increase activity slowly    Complete by:  As directed             Medication List    TAKE these medications        albuterol 108 (90  BASE) MCG/ACT inhaler  Commonly known as:  PROVENTIL HFA;VENTOLIN HFA  Inhale 2 puffs into the lungs every 4 (four) hours as needed for wheezing or shortness of breath (wheezing).     amLODipine 5 MG tablet  Commonly known as:  NORVASC  Take 1 tablet (5 mg total) by mouth daily.     diazepam 10 MG tablet  Commonly known as:  VALIUM  Take 10 mg by mouth every 8 (eight) hours as needed for anxiety (anxiety). For anxiety     FLUoxetine 10 MG capsule  Commonly known as:  PROZAC  Take 10 mg by mouth every morning.     furosemide 20 MG tablet  Commonly known as:  LASIX  Take 20 mg by mouth daily.     HYDROcodone-acetaminophen 7.5-325 mg/15 ml solution  Commonly known as:  HYCET  Take 10-15 mLs by mouth every 4 (four) hours as needed for moderate pain or severe pain.     ibuprofen 800 MG tablet  Commonly known as:  ADVIL,MOTRIN  Take 1 tablet (800 mg total) by mouth 3 (three) times daily.     insulin regular human CONCENTRATED 500 UNIT/ML Soln injection  Commonly known as:  HUMULIN R  Inject 120-160 Units into the skin 3 (three) times daily before meals. Take 120 units in the morning before breakfast, 160 units before lunch, and 120 units before dinner.     meloxicam 15 MG tablet  Commonly known as:  MOBIC  Take 1  tablet (15 mg total) by mouth daily.     metFORMIN 850 MG tablet  Commonly known as:  GLUCOPHAGE  Take 850 mg by mouth 2 (two) times daily with a meal.     NON FORMULARY  Pt uses CPAP machine     omeprazole 40 MG capsule  Commonly known as:  PRILOSEC  Take 1 capsule (40 mg total) by mouth 2 (two) times daily.     Oxycodone HCl 20 MG Tabs  Take 1 tablet by mouth 5 (five) times daily as needed (pain.).     predniSONE 50 MG tablet  Commonly known as:  DELTASONE  Take daily with breakfast     QUEtiapine 300 MG 24 hr tablet  Commonly known as:  SEROQUEL XR  Take 600 mg by mouth at bedtime.     valsartan-hydrochlorothiazide 320-25 MG per tablet  Commonly known  as:  DIOVAN-HCT  Take 1 tablet by mouth daily.     venlafaxine 75 MG tablet  Commonly known as:  EFFEXOR  Take 150 mg by mouth 2 (two) times daily.           Follow-up Information    Follow up with Nizhoni Parlow, MD. Schedule an appointment as soon as possible for a visit in 1 month.   Specialty:  Otolaryngology   Contact information:   [address] [place] [address] [phone]       Signed: Christia Reading 05/25/2015, 8:33 AM

## 2015-05-26 LAB — HEMOGLOBIN A1C
Hgb A1c MFr Bld: 10.7 % — ABNORMAL HIGH (ref 4.8–5.6)
Mean Plasma Glucose: 260 mg/dL

## 2015-06-08 ENCOUNTER — Ambulatory Visit: Payer: Medicare Other | Admitting: Podiatry

## 2015-06-22 ENCOUNTER — Encounter: Payer: Self-pay | Admitting: Podiatry

## 2015-06-22 ENCOUNTER — Ambulatory Visit (INDEPENDENT_AMBULATORY_CARE_PROVIDER_SITE_OTHER): Payer: Medicare Other | Admitting: Podiatry

## 2015-06-22 VITALS — BP 134/79 | HR 108 | Resp 16

## 2015-06-22 DIAGNOSIS — L600 Ingrowing nail: Secondary | ICD-10-CM

## 2015-06-22 DIAGNOSIS — L603 Nail dystrophy: Secondary | ICD-10-CM | POA: Diagnosis not present

## 2015-06-22 MED ORDER — NEOMYCIN-POLYMYXIN-HC 1 % OT SOLN: OTIC | Status: DC

## 2015-06-22 NOTE — Progress Notes (Signed)
Patient presents today requesting removal of painful dystrophic ingrown toenails of the hallux left and 2nd digits bilateral. She states that her last A1c was 7.2, however noted in her chart on 05/24/15 it was 10.7.  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 Pulses are strongly palpable bilateral.  Assessment: Dystrophic Ingrown nail hallux left and 2nd digits bilateral.  Plan: Chemical matrixectomy was performed today after local anesthesia was achieved. Patient tolerated procedure well as the nails were removed in total exposed the matrix of the nailbeds to the phenol. Phenol was applied 30 seconds each 3 and was neutralized with isopropyl all all. Patient will start soaking it twice a day in Betadine warm water and apply Cortisporin Otic as directed. These instructions were provided and reviewed. Follow up in 1 week to recheck.

## 2015-06-22 NOTE — Patient Instructions (Signed)

## 2015-06-26 ENCOUNTER — Other Ambulatory Visit: Payer: Self-pay

## 2015-06-29 ENCOUNTER — Ambulatory Visit (INDEPENDENT_AMBULATORY_CARE_PROVIDER_SITE_OTHER): Payer: Medicare Other | Admitting: Podiatry

## 2015-06-29 ENCOUNTER — Encounter: Payer: Self-pay | Admitting: Podiatry

## 2015-06-29 VITALS — BP 142/92 | HR 99 | Resp 16

## 2015-06-29 DIAGNOSIS — M722 Plantar fascial fibromatosis: Secondary | ICD-10-CM

## 2015-06-29 DIAGNOSIS — L600 Ingrowing nail: Secondary | ICD-10-CM

## 2015-06-29 NOTE — Progress Notes (Signed)
This patient presents today for follow-up matrixectomy. She continue to soak twice daily and apply Cortisporin otic as directed. Relating some drainage and redness. Also she is still having some pain in the heels.  Objective: Vital signs are stable. Secondly site appears to be healing with some erythema and drainage.  Assessment: Slow healing surgical matrixectomy hallux left and 2nd digits bilateral. Plantar fasciitis bilateral   Plan: Currently we will discontinue the use of Betadine soaks and start with Epsom salts and water twice daily. Continue the use of Cortisporin Otic and covered during the day leaving it open at night time. They will continue to soak the toe until completely well. They will continue to watch the toe for increased signs and symptoms of infection should any arise we will be notified immediately. Injected bilateral heels with kenalog and local anesthetic. Follow-up in 4 weeks.

## 2015-06-29 NOTE — Patient Instructions (Signed)

## 2015-07-04 IMAGING — CR DG CHEST 1V PORT
1 series · 1 of 1 positions shown · non-contrast
Comparison: 12/07/2012

CLINICAL DATA: Hyperglycemia.  Chest pressure

PORTABLE CHEST - 1 VIEW

[AP]
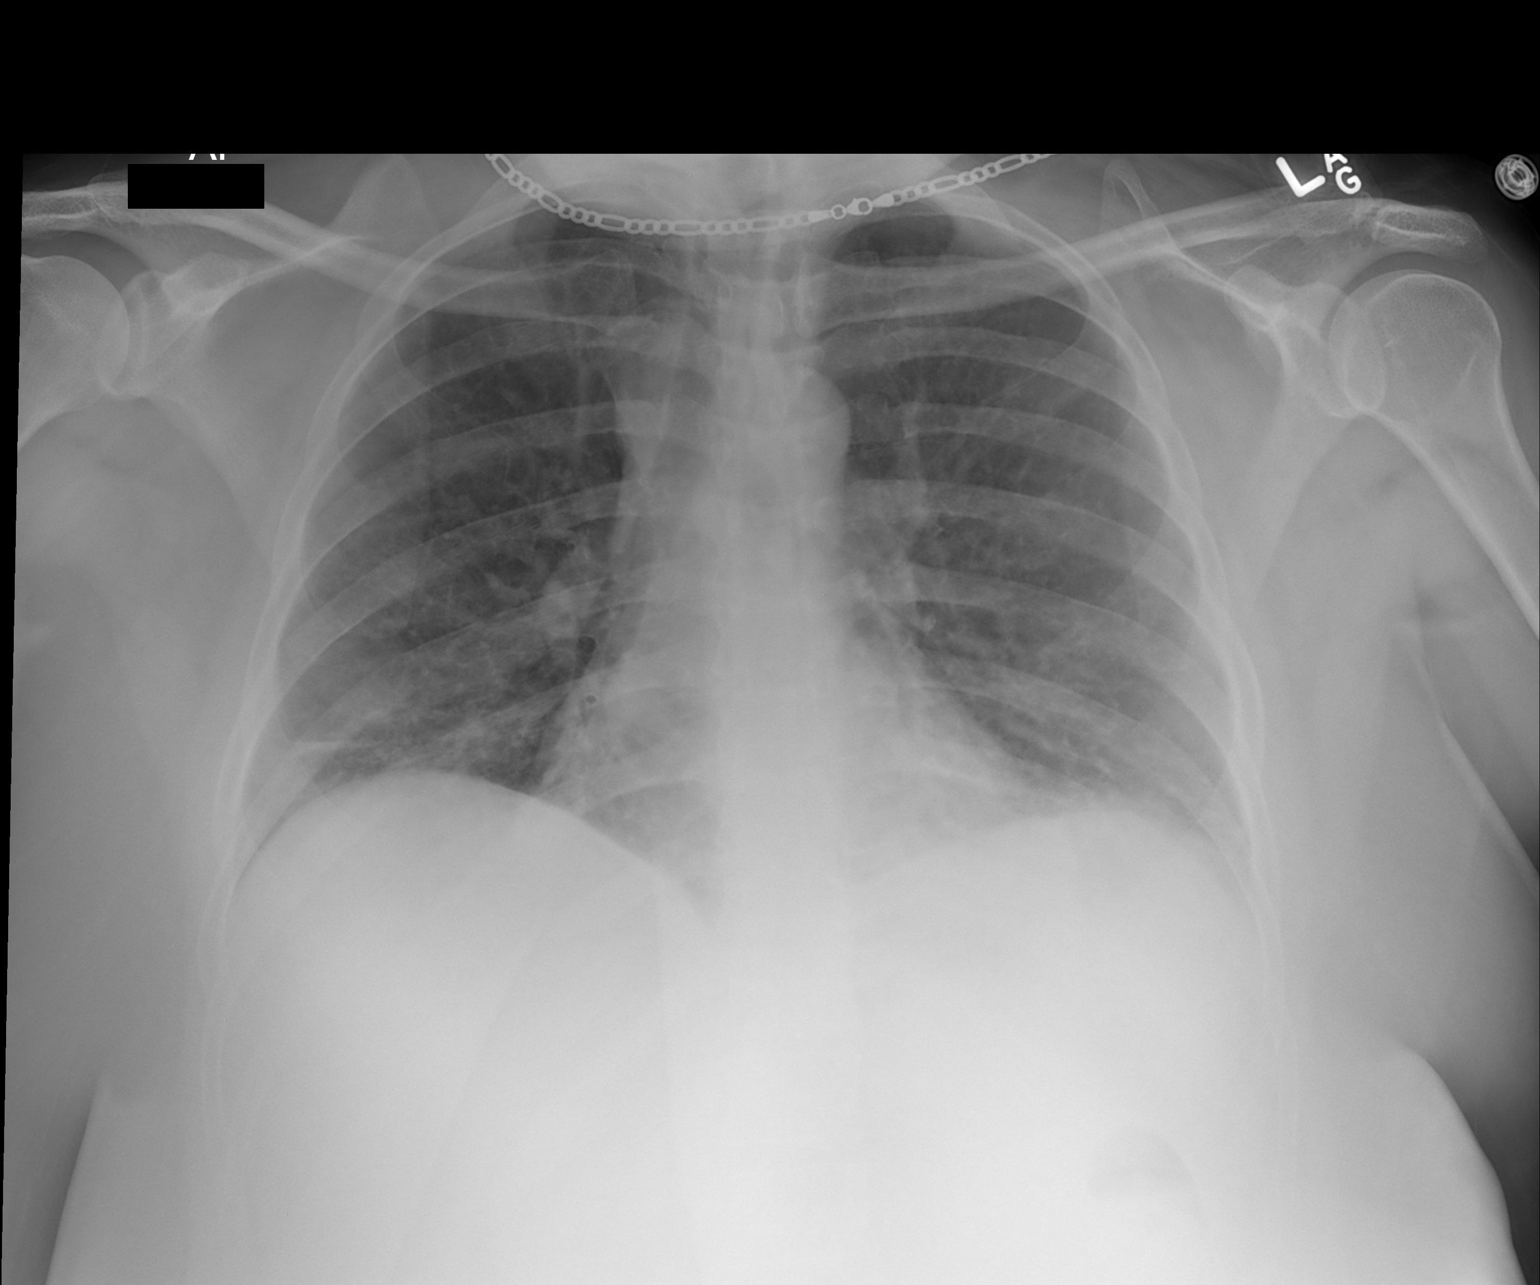

[1 of 1 positions shown; findings below may reference images not displayed]

FINDINGS: The heart size is normal.  The lungs are suboptimally
inflated.  There is plate-like atelectasis noted in the lung bases.
No pleural effusion or edema.  No airspace consolidation.
IMPRESSION: 1.  Low lung volumes with plate-like atelectasis.

## 2015-07-27 ENCOUNTER — Ambulatory Visit: Payer: Medicare Other | Admitting: Podiatry

## 2015-10-26 DIAGNOSIS — M24851 Other specific joint derangements of right hip, not elsewhere classified: Secondary | ICD-10-CM | POA: Insufficient documentation

## 2015-11-24 ENCOUNTER — Emergency Department (HOSPITAL_COMMUNITY): Payer: Medicare Other

## 2015-11-24 ENCOUNTER — Emergency Department (HOSPITAL_COMMUNITY)
Admission: EM | Admit: 2015-11-24 | Discharge: 2015-11-24 | Disposition: A | Discharge status: Home / Self Care | Payer: Medicare Other | Attending: Emergency Medicine | Admitting: Emergency Medicine

## 2015-11-24 ENCOUNTER — Encounter (HOSPITAL_COMMUNITY): Payer: Self-pay | Admitting: *Deleted

## 2015-11-24 DIAGNOSIS — Z791 Long term (current) use of non-steroidal anti-inflammatories (NSAID): Secondary | ICD-10-CM | POA: Insufficient documentation

## 2015-11-24 DIAGNOSIS — Z91040 Latex allergy status: Secondary | ICD-10-CM | POA: Diagnosis not present

## 2015-11-24 DIAGNOSIS — G8929 Other chronic pain: Secondary | ICD-10-CM | POA: Insufficient documentation

## 2015-11-24 DIAGNOSIS — Z880 Allergy status to penicillin: Secondary | ICD-10-CM | POA: Diagnosis not present

## 2015-11-24 DIAGNOSIS — F17210 Nicotine dependence, cigarettes, uncomplicated: Secondary | ICD-10-CM | POA: Insufficient documentation

## 2015-11-24 DIAGNOSIS — K219 Gastro-esophageal reflux disease without esophagitis: Secondary | ICD-10-CM | POA: Insufficient documentation

## 2015-11-24 DIAGNOSIS — G4733 Obstructive sleep apnea (adult) (pediatric): Secondary | ICD-10-CM | POA: Diagnosis not present

## 2015-11-24 DIAGNOSIS — E119 Type 2 diabetes mellitus without complications: Secondary | ICD-10-CM | POA: Insufficient documentation

## 2015-11-24 DIAGNOSIS — J45909 Unspecified asthma, uncomplicated: Secondary | ICD-10-CM | POA: Diagnosis not present

## 2015-11-24 DIAGNOSIS — M79641 Pain in right hand: Secondary | ICD-10-CM | POA: Diagnosis present

## 2015-11-24 DIAGNOSIS — Z79899 Other long term (current) drug therapy: Secondary | ICD-10-CM | POA: Diagnosis not present

## 2015-11-24 DIAGNOSIS — F419 Anxiety disorder, unspecified: Secondary | ICD-10-CM | POA: Insufficient documentation

## 2015-11-24 DIAGNOSIS — F319 Bipolar disorder, unspecified: Secondary | ICD-10-CM | POA: Insufficient documentation

## 2015-11-24 DIAGNOSIS — Z8719 Personal history of other diseases of the digestive system: Secondary | ICD-10-CM | POA: Diagnosis not present

## 2015-11-24 DIAGNOSIS — Z8679 Personal history of other diseases of the circulatory system: Secondary | ICD-10-CM | POA: Insufficient documentation

## 2015-11-24 DIAGNOSIS — Z9981 Dependence on supplemental oxygen: Secondary | ICD-10-CM | POA: Insufficient documentation

## 2015-11-24 DIAGNOSIS — I10 Essential (primary) hypertension: Secondary | ICD-10-CM | POA: Insufficient documentation

## 2015-11-24 MED ORDER — IBUPROFEN 800 MG PO TABS: 800.0000 mg | ORAL_TABLET | Freq: Three times a day (TID) | ORAL | Status: DC

## 2015-11-24 MED ORDER — IBUPROFEN 400 MG PO TABS
800.0000 mg | ORAL_TABLET | Freq: Once | ORAL | Status: AC
  Administered 2015-11-24: 800 mg via ORAL
  Filled 2015-11-24: qty 2

## 2015-11-24 MED ORDER — ACETAMINOPHEN 325 MG PO TABS
650.0000 mg | ORAL_TABLET | Freq: Once | ORAL | Status: AC
  Administered 2015-11-24: 650 mg via ORAL
  Filled 2015-11-24: qty 2

## 2015-11-24 NOTE — ED Notes (Signed)
Pt in c/o pain to her right thumb and top of wrist, states the pain is constant, started a few weeks ago and is getting progressively worse, states it clicks at times

## 2015-11-24 NOTE — ED Provider Notes (Signed)
CSN: [id number]     Arrival date & time 11/24/15  1553 History  By signing my name below, I, Jarvis Morgan, attest that this documentation has been prepared under the direction and in the presence of Cheri Fowler, PA-C Electronically Signed: Jarvis Morgan, ED Scribe. 11/24/2015. 5:12 PM.    Chief Complaint  Patient presents with  . Hand Pain    The history is provided by the patient. No language interpreter was used.    HPI Comments: Stephanie Thornton is a 44 y.o. female with a h/o arthritis who presents to the Emergency Department complaining of constant, moderate, throbbing and aching, right thumb pain onset 2 weeks that has begun to gradually worsening over the past several days. She reports the pain radiates down into her right wrist. Pt notes the pain is exacerbated with movement of her thumb or trying to grip with her thumb. She states she has soaked in Epsom salt, applied ice and took  Ibuprofen with no significant relief. Pt endorses the pain has been waking her up from sleep. She denies any known injury or trauma to the hand. Pt notes she has a h/o carpal tunnel in her right wrist and has had surgical repair on the area. She denies any swelling, redness, numbness, tingling, weakness, sensation loss, fevers, or other associated symptoms  PCP: Everrett Coombe, DO  Past Medical History  Diagnosis Date  . Bipolar disorder (HCC) diagnosed early 16s  . Depression   . High blood pressure   . Menopause   . Asthma   . GERD (gastroesophageal reflux disease)   . History of hiatal hernia   . Benign essential HTN 05/01/2015  . Chronic back pain   . Chronic bronchitis (HCC)     "get it q yr"  . OSA on CPAP   . Diabetes mellitus type II 2010  . History of stomach ulcers   . Headache     "maybe 3 times/wk" (05/24/2015)  . Migraine     "1-2/wk" (05/24/2015)  . Arthritis     "knees, feet, back" (05/24/2015)  . Chronic lower back pain   . Anxiety    Past Surgical History  Procedure  Laterality Date  . Abdominal hysterectomy  2007  . Bunionectomy Bilateral   . Tonsillectomy Bilateral 05/24/2015  . Carpal tunnel release Right   . Hammer toe surgery Left   . Tonsillectomy Bilateral 05/24/2015    Procedure: TONSILLECTOMY;  Surgeon: Christia Reading, MD;  Location: [facility] OR;  Service: ENT;  Laterality: Bilateral;   Family History  Problem Relation Age of Onset  . Bipolar disorder Mother   . CVA Mother   . Bipolar disorder Son   . Depression Brother     schizoaffective d/o  . Hypertension Sister   . Heart disease Maternal Aunt   . Heart attack Maternal Aunt   . Hypertension Brother    Social History  Substance Use Topics  . Smoking status: Current Some Day Smoker -- 1.00 packs/day for 30 years    Types: Cigarettes  . Smokeless tobacco: Never Used  . Alcohol Use: No   OB History    No data available     Review of Systems  All other systems reviewed and are negative.     Allergies  Lyrica; Tramadol; Penicillins; Sulfa antibiotics; and Latex  Home Medications   Prior to Admission medications   Medication Sig Start Date End Date Taking? Authorizing Provider  albuterol (PROVENTIL HFA;VENTOLIN HFA) 108 (90 BASE) MCG/ACT inhaler Inhale 2  puffs into the lungs every 4 (four) hours as needed for wheezing or shortness of breath (wheezing).     Historical Provider, MD  amLODipine (NORVASC) 10 MG tablet Take 1 tablet (10 mg total) by mouth daily. 05/26/15   Drema Dallas, MD  diazepam (VALIUM) 10 MG tablet Take 10 mg by mouth every 8 (eight) hours as needed for anxiety (anxiety). For anxiety    Leilani Able, MD  FLUoxetine (PROZAC) 10 MG capsule Take 10 mg by mouth every morning.     Historical Provider, MD  furosemide (LASIX) 20 MG tablet Take 20 mg by mouth daily.    Historical Provider, MD  hydrochlorothiazide (HYDRODIURIL) 50 MG tablet Take 1 tablet (50 mg total) by mouth daily. 05/26/15   Drema Dallas, MD  HYDROcodone-acetaminophen (HYCET) 7.5-325 mg/15 ml solution  Take 10-15 mLs by mouth every 4 (four) hours as needed for moderate pain or severe pain. 05/25/15   Christia Reading, MD  ibuprofen (ADVIL,MOTRIN) 800 MG tablet Take 1 tablet (800 mg total) by mouth 3 (three) times daily. 11/24/15   Cheri Fowler, PA-C  irbesartan (AVAPRO) 300 MG tablet Take 1 tablet (300 mg total) by mouth daily. 05/25/15   Drema Dallas, MD  meloxicam (MOBIC) 15 MG tablet Take 1 tablet (15 mg total) by mouth daily. 06/28/14   Max T Hyatt, DPM  metFORMIN (GLUCOPHAGE) 850 MG tablet Take 850 mg by mouth 2 (two) times daily with a meal.    Historical Provider, MD  NEOMYCIN-POLYMYXIN-HYDROCORTISONE (CORTISPORIN) 1 % SOLN otic solution Apply 1-2 drops to toe BID after soaking 06/22/15   Max T Hyatt, DPM  NON FORMULARY Pt uses CPAP machine    Historical Provider, MD  omeprazole (PRILOSEC) 40 MG capsule Take 1 capsule (40 mg total) by mouth 2 (two) times daily. 09/11/13   Vassie Loll, MD  Oxycodone HCl 20 MG TABS Take 1 tablet by mouth 5 (five) times daily as needed (pain.).    Historical Provider, MD  predniSONE (DELTASONE) 50 MG tablet Take daily with breakfast Patient not taking: Reported on 05/16/2015 03/16/15   Ozella Rocks, MD  QUEtiapine (SEROQUEL XR) 300 MG 24 hr tablet Take 600 mg by mouth at bedtime.     Historical Provider, MD   Triage Vitals: BP 157/104 mmHg  Pulse 96  Temp(Src) 98.2 F (36.8 C) (Oral)  Resp 18  SpO2 100%  Physical Exam  Constitutional: She is oriented to person, place, and time. She appears well-developed and well-nourished. No distress.  HENT:  Head: Normocephalic and atraumatic.  Eyes: Conjunctivae and EOM are normal.  Neck: Neck supple. No tracheal deviation present.  Cardiovascular: Normal rate, regular rhythm and normal heart sounds.   Pulmonary/Chest: Effort normal and breath sounds normal. No respiratory distress.  Abdominal: She exhibits no distension.  Musculoskeletal: She exhibits tenderness.       Right wrist: Normal. She exhibits normal range  of motion, no tenderness, no bony tenderness, no swelling and no effusion.       Right hand: She exhibits decreased range of motion (secondary to pain), tenderness, bony tenderness and swelling (mild thumb swelling). She exhibits normal capillary refill, no deformity and no laceration. Normal sensation noted. Decreased strength (secondary to pain) noted.  Right thumb TTP, mild swelling.  No erythema or warmth.  Neurological: She is alert and oriented to person, place, and time.  Skin: Skin is warm and dry.  Psychiatric: She has a normal mood and affect. Her behavior is normal.  Nursing  note and vitals reviewed.   ED Course  Procedures (including critical care time)  DIAGNOSTIC STUDIES: Oxygen Saturation is 100% on RA, normal by my interpretation.    COORDINATION OF CARE:  4:29 PM- Will order imaging of right hand and wrist.  Pt advised of plan for treatment and pt agrees.    Labs Review Labs Reviewed - No data to display  Imaging Review Dg Wrist Complete Right  11/24/2015  CLINICAL DATA:  Right wrist and thumb pain for 1 week without known injury. EXAM: RIGHT WRIST - COMPLETE 3+ VIEW COMPARISON:  None. FINDINGS: Spurring of the distal scaphoid observed along with minimal spurring at the first carpometacarpal articulation. No scapholunate or lunatotriquetral widening. Suspected small geode along the mid scaphoid best appreciated on the oblique projection. Blunted ulnar styloid although this may be incidental. IMPRESSION: 1. No acute bony abnormality is identified. 2. Mild spurring of the distal scaphoid and at the first carpometacarpal articulation. Electronically Signed   By: Gaylyn RongWalter  Liebkemann M.D.   On: 11/24/2015 16:56   Dg Hand Complete Right  11/24/2015  CLINICAL DATA:  Right wrist and thumb pain for 1 week. No known injury. Initial encounter. EXAM: RIGHT HAND - COMPLETE 3+ VIEW COMPARISON:  None. FINDINGS: There is no evidence of fracture or dislocation. There is no evidence of  arthropathy or other focal bone abnormality. Soft tissues are unremarkable. IMPRESSION: Negative exam. Electronically Signed   By: Drusilla Kannerhomas  Dalessio M.D.   On: 11/24/2015 16:52   I have personally reviewed and evaluated these images as part of my medical decision-making.   EKG Interpretation None      MDM   Final diagnoses:  Right hand pain    Patient presents with right thumb pain.  VS show HTN, pt has hx of HTN.  NVI.  Right thumb TTP with mild swelling.  Will give motrin and tylenol for pain.  Will obtain plain films.  Mild spurring of distal scaphoid and at first carpometacarpal articulation.  Will follow up with ortho.  Evaluation does not show pathology requring ongoing emergent intervention or admission. Pt is hemodynamically stable and mentating appropriately. Discussed findings/results and plan with patient/guardian, who agrees with plan. All questions answered. Return precautions discussed and outpatient follow up given.    I personally performed the services described in this documentation, which was scribed in my presence. The recorded information has been reviewed and is accurate.    Cheri FowlerKayla Oaklee Esther, PA-C 11/24/15 1712  Nelva Nayobert Beaton, MD 12/04/15 [phone]

## 2015-11-24 NOTE — ED Notes (Signed)
Pt offered ice, pt refused.

## 2015-11-24 NOTE — ED Notes (Signed)
See PA assessment 

## 2015-11-24 NOTE — Discharge Instructions (Signed)
Musculoskeletal Pain °Musculoskeletal pain is muscle and boney aches and pains. These pains can occur in any part of the body. Your caregiver may treat you without knowing the cause of the pain. They may treat you if blood or urine tests, X-rays, and other tests were normal.  °CAUSES °There is often not a definite cause or reason for these pains. These pains may be caused by a type of germ (virus). The discomfort may also come from overuse. Overuse includes working out too hard when your body is not fit. Boney aches also come from weather changes. Bone is sensitive to atmospheric pressure changes. °HOME CARE INSTRUCTIONS  °· Ask when your test results will be ready. Make sure you get your test results. °· Only take over-the-counter or prescription medicines for pain, discomfort, or fever as directed by your caregiver. If you were given medications for your condition, do not drive, operate machinery or power tools, or sign legal documents for 24 hours. Do not drink alcohol. Do not take sleeping pills or other medications that may interfere with treatment. °· Continue all activities unless the activities cause more pain. When the pain lessens, slowly resume normal activities. Gradually increase the intensity and duration of the activities or exercise. °· During periods of severe pain, bed rest may be helpful. Lay or sit in any position that is comfortable. °· Putting ice on the injured area. °· Put ice in a bag. °· Place a towel between your skin and the bag. °· Leave the ice on for 15 to 20 minutes, 3 to 4 times a day. °· Follow up with your caregiver for continued problems and no reason can be found for the pain. If the pain becomes worse or does not go away, it may be necessary to repeat tests or do additional testing. Your caregiver may need to look further for a possible cause. °SEEK IMMEDIATE MEDICAL CARE IF: °· You have pain that is getting worse and is not relieved by medications. °· You develop chest pain  that is associated with shortness or breath, sweating, feeling sick to your stomach (nauseous), or throw up (vomit). °· Your pain becomes localized to the abdomen. °· You develop any new symptoms that seem different or that concern you. °MAKE SURE YOU:  °· Understand these instructions. °· Will watch your condition. °· Will get help right away if you are not doing well or get worse. °  °This information is not intended to replace advice given to you by your health care provider. Make sure you discuss any questions you have with your health care provider. °  °Document Released: 12/16/2005 Document Revised: 03/09/2012 Document Reviewed: 08/20/2013 °Elsevier Interactive Patient Education ©2016 [employer] ° °Wrist Pain °There are many things that can cause wrist pain. Some common causes include: °· An injury to the wrist area, such as a sprain, strain, or fracture. °· Overuse of the joint. °· A condition that causes increased pressure on a nerve in the wrist (carpal tunnel syndrome). °· Wear and tear of the joints that occurs with aging (osteoarthritis). °· A variety of other types of arthritis. °Sometimes, the cause of wrist pain is not known. The pain often goes away when you follow your health care provider's instructions for relieving pain at home. If your wrist pain continues, tests may need to be done to diagnose your condition. °HOME CARE INSTRUCTIONS °Pay attention to any changes in your symptoms. Take these actions to help with your pain: °· Rest the wrist area for   at least 48 hours or as told by your health care provider. °· If directed, apply ice to the injured area: °¨ Put ice in a plastic bag. °¨ Place a towel between your skin and the bag. °¨ Leave the ice on for 20 minutes, 2-3 times per day. °· Keep your arm raised (elevated) above the level of your heart while you are sitting or lying down. °· If a splint or elastic bandage has been applied, use it as told by your health care provider. °¨ Remove the  splint or bandage only as told by your health care provider. °¨ Loosen the splint or bandage if your fingers become numb or have a tingling feeling, or if they turn cold or blue. °· Take over-the-counter and prescription medicines only as told by your health care provider. °· Keep all follow-up visits as told by your health care provider. This is important. °SEEK MEDICAL CARE IF: °· Your pain is not helped by treatment. °· Your pain gets worse. °SEEK IMMEDIATE MEDICAL CARE IF: °· Your fingers become swollen. °· Your fingers turn white, very red, or cold and blue. °· Your fingers are numb or have a tingling feeling. °· You have difficulty moving your fingers. °  °This information is not intended to replace advice given to you by your health care provider. Make sure you discuss any questions you have with your health care provider. °  °Document Released: 09/25/2005 Document Revised: 09/06/2015 Document Reviewed: 05/03/2015 °Elsevier Interactive Patient Education ©2016 [employer] ° °

## 2016-01-01 IMAGING — US US ABDOMEN COMPLETE
1 series · 14 of 25 positions shown · non-contrast
Comparison: No priors.

CLINICAL DATA: Right upper quadrant abdominal pain.

EXAM:
ULTRASOUND ABDOMEN COMPLETE

[Series 1: us abdomen complete · 0.26mm/px · 14 of 202 slices shown]
[im 1/202]
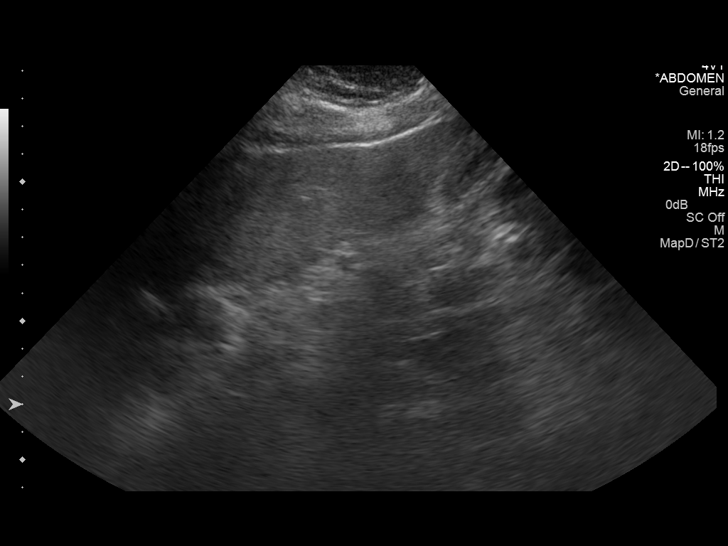
[im 17/202]
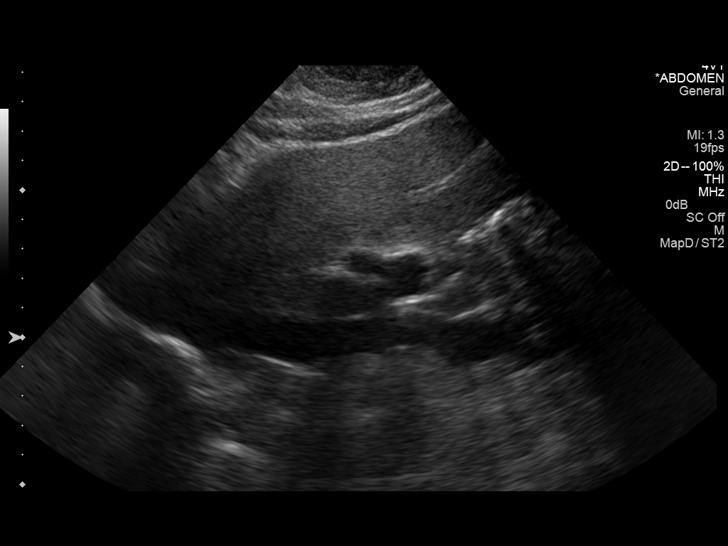
[im 34/202]
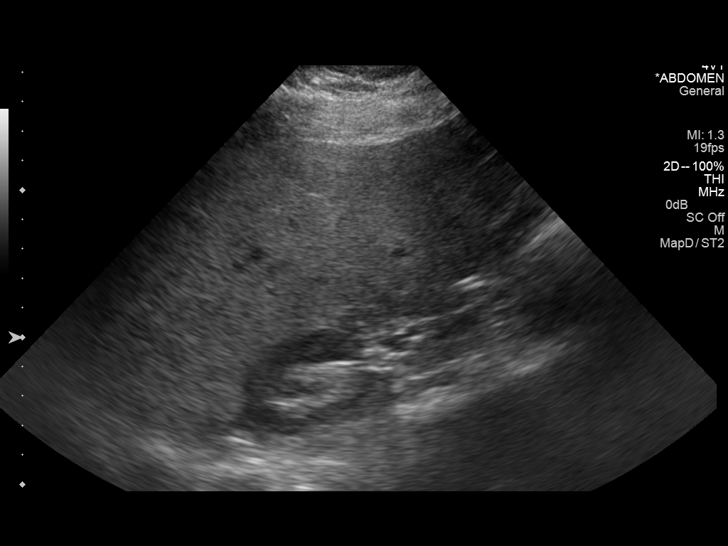
[im 51/202]
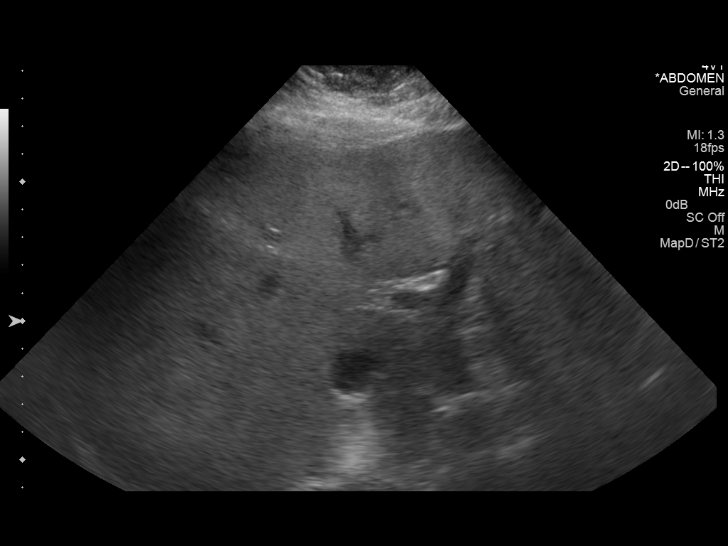
[im 68/202]
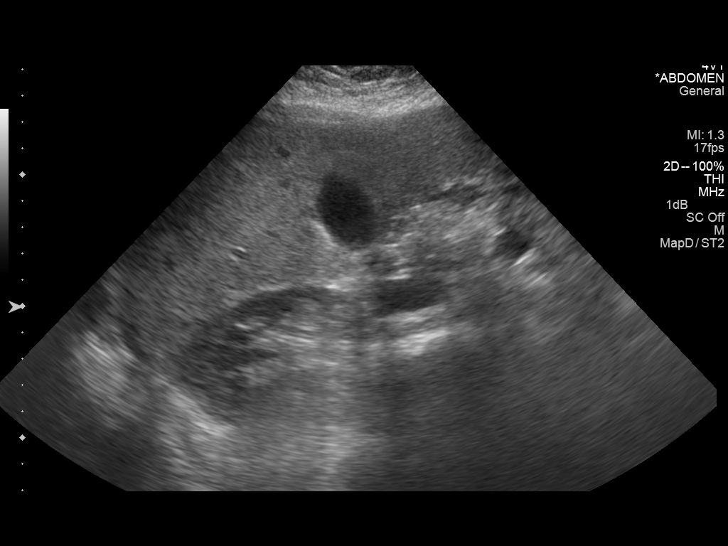
[im 76/202]
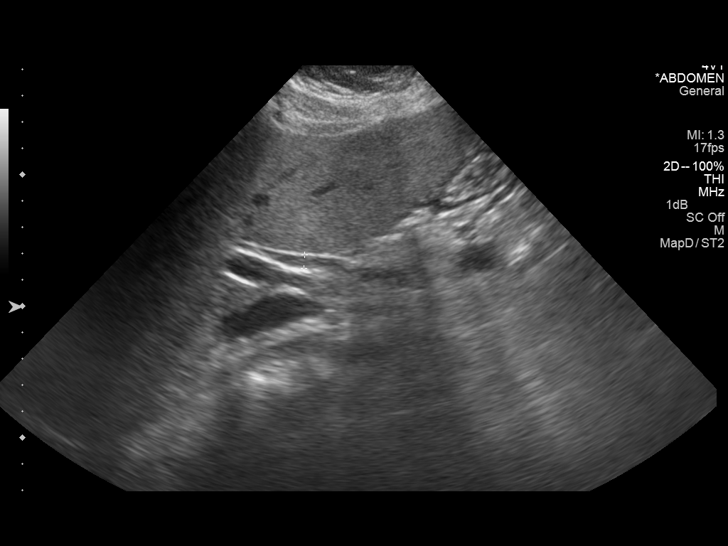
[im 93/202]
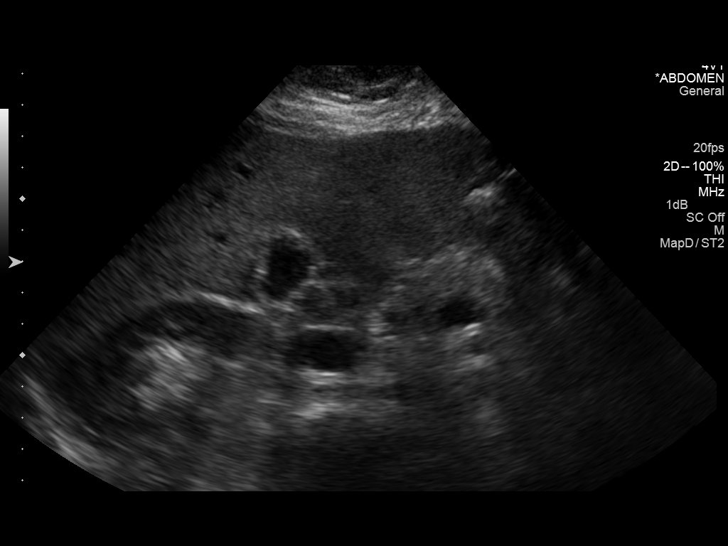
[im 109/202]
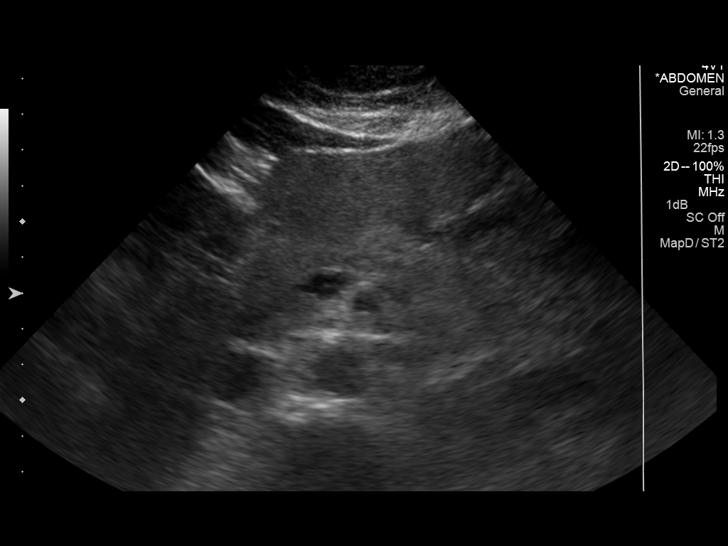
[im 126/202]
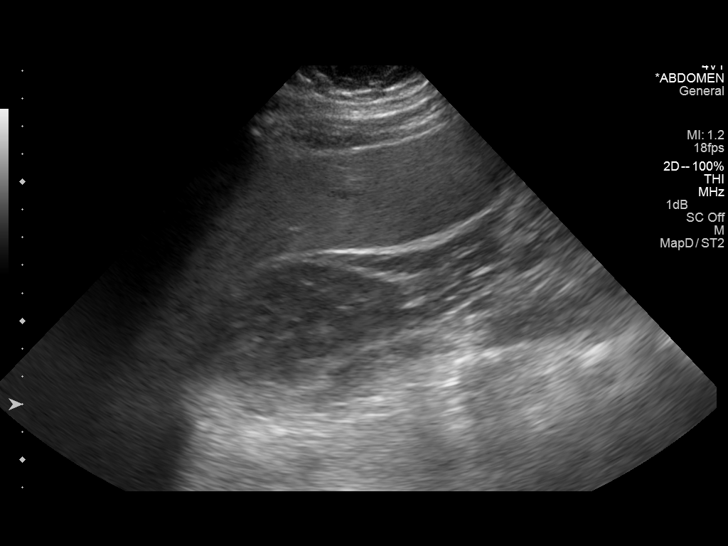
[im 135/202]
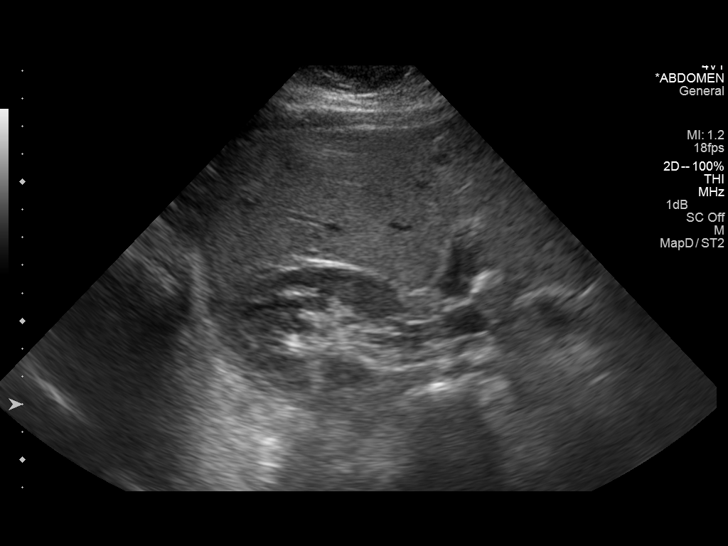
[im 151/202]
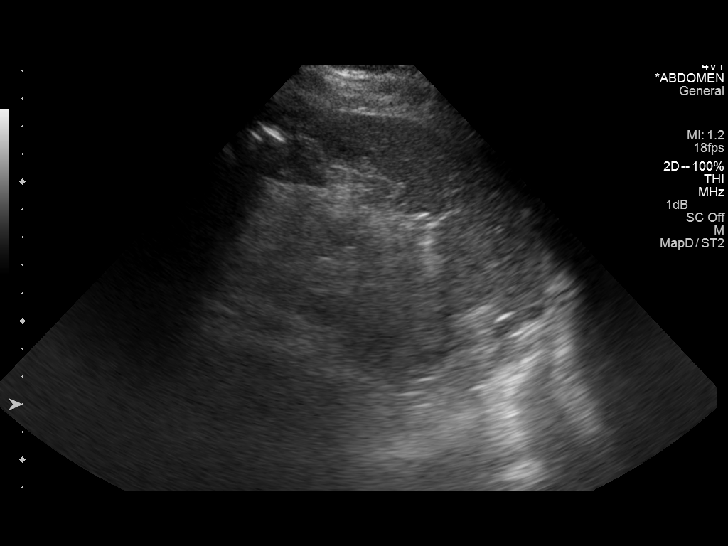
[im 168/202]
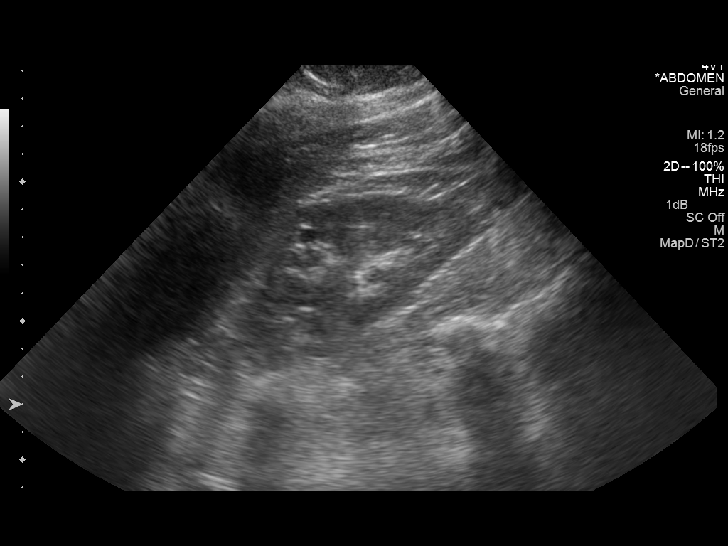
[im 185/202]
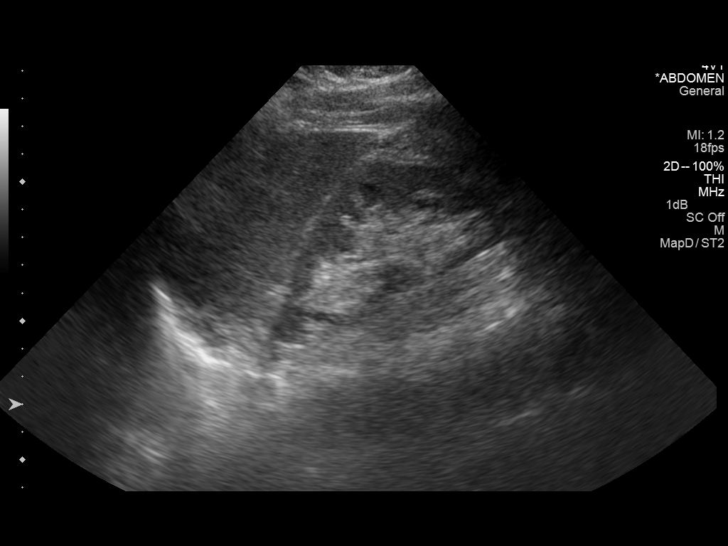
[im 202/202]
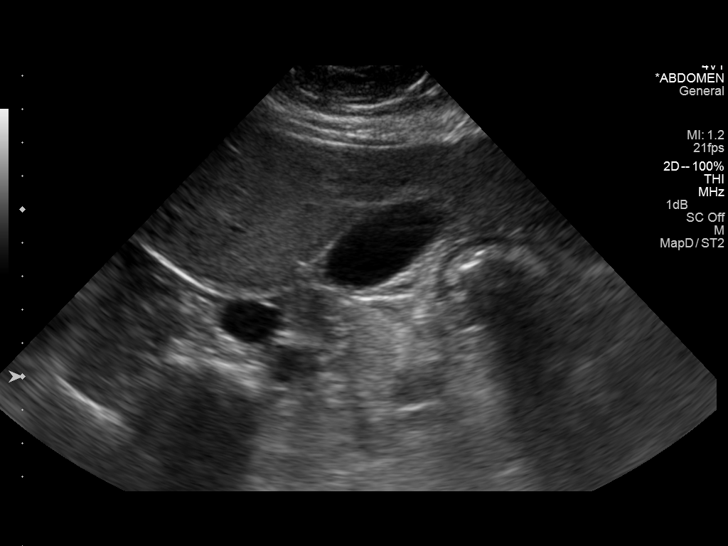

[14 of 25 positions shown; findings below may reference images not displayed]

FINDINGS: Gallbladder:

No gallstones or wall thickening visualized. No sonographic Murphy
sign noted.

Common bile duct:

Diameter: 5.1 mm in the porta hepatis.

Liver:

No focal lesion identified. Within normal limits in parenchymal
echogenicity.

IVC:

No abnormality visualized.

Pancreas:

Visualized portion unremarkable.

Spleen:

Size and appearance within normal limits.  10.9 cm in length.

Right Kidney:

Length: 11.6 cm. Echogenicity within normal limits. No mass or
hydronephrosis visualized.

Left Kidney:

Length: 11.3 cm. Echogenicity within normal limits. No mass or
hydronephrosis visualized.

Abdominal aorta:

No aneurysm visualized.

Other findings:

None.
IMPRESSION: 1. No acute findings to account for the patient's symptoms.
Specifically, no evidence of gallstones or findings to suggest acute
cholecystitis at this time.

## 2016-10-22 DIAGNOSIS — M6530 Trigger finger, unspecified finger: Secondary | ICD-10-CM | POA: Insufficient documentation

## 2017-03-04 DIAGNOSIS — M6740 Ganglion, unspecified site: Secondary | ICD-10-CM | POA: Insufficient documentation

## 2017-03-07 IMAGING — NM NM MISC PROCEDURE
6 series · 36 of 36 positions shown · non-contrast
Comparison: none

[Series 1: rest · 6.40mm/px · 6 of 64 frames shown]
[frame 6/64]
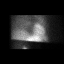
[frame 16/64]
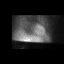
[frame 27/64]
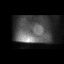
[frame 38/64]
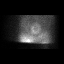
[frame 48/64]
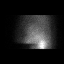
[frame 59/64]
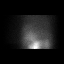

[Series 1: wbr_s-card_st stress-gsp · 6.4mm · 6.40mm/px · 6 of 165 frames shown]
[frame 14/165]
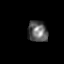
[frame 42/165]
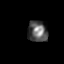
[frame 69/165]
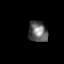
[frame 97/165]
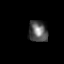
[frame 124/165]
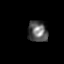
[frame 152/165]
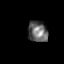

[Series 1: wbr_s-card_st stress-sum-em · 6.4mm · 6.40mm/px · 6 of 21 frames shown]
[frame 2/21]
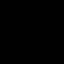
[frame 6/21]
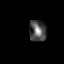
[frame 9/21]
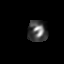
[frame 13/21]
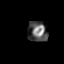
[frame 16/21]
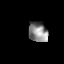
[frame 20/21]
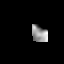

[Series 1: stress-gsp · 6.40mm/px · 6 of 512 frames shown]
[frame 43/512]
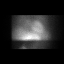
[frame 128/512]
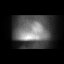
[frame 214/512]
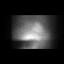
[frame 299/512]
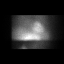
[frame 384/512]
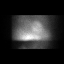
[frame 470/512]
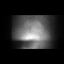

[Series 1: stress-sum-em · 6.40mm/px · 6 of 64 frames shown]
[frame 6/64]
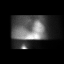
[frame 16/64]
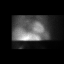
[frame 27/64]
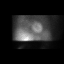
[frame 38/64]
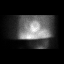
[frame 48/64]
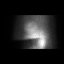
[frame 59/64]
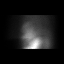

[Series 1: wbr_r-card_st rest · 6.4mm · 6.40mm/px · 6 of 22 frames shown]
[frame 2/22]
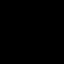
[frame 6/22]
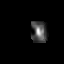
[frame 10/22]
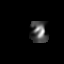
[frame 13/22]
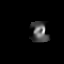
[frame 17/22]
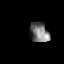
[frame 21/22]
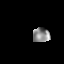

[36 of 36 positions shown; findings below may reference images not displayed]

Canned report from images found in remote index.

Refer to host system for actual result text.

## 2017-06-24 DIAGNOSIS — M5136 Other intervertebral disc degeneration, lumbar region: Secondary | ICD-10-CM | POA: Insufficient documentation

## 2017-06-24 DIAGNOSIS — G894 Chronic pain syndrome: Secondary | ICD-10-CM | POA: Insufficient documentation

## 2017-06-24 DIAGNOSIS — F17200 Nicotine dependence, unspecified, uncomplicated: Secondary | ICD-10-CM | POA: Insufficient documentation

## 2017-09-08 ENCOUNTER — Other Ambulatory Visit (HOSPITAL_COMMUNITY): Payer: Self-pay | Admitting: Physician Assistant

## 2017-09-08 DIAGNOSIS — G8929 Other chronic pain: Secondary | ICD-10-CM

## 2017-09-08 DIAGNOSIS — Z79899 Other long term (current) drug therapy: Secondary | ICD-10-CM

## 2017-09-09 ENCOUNTER — Ambulatory Visit (HOSPITAL_COMMUNITY)
Admission: RE | Admit: 2017-09-09 | Discharge: 2017-09-09 | Disposition: A | Discharge status: Home / Self Care | Payer: Medicare Other | Source: Ambulatory Visit | Attending: Physician Assistant | Admitting: Physician Assistant

## 2017-09-09 DIAGNOSIS — M48061 Spinal stenosis, lumbar region without neurogenic claudication: Secondary | ICD-10-CM | POA: Diagnosis not present

## 2017-09-09 DIAGNOSIS — G8929 Other chronic pain: Secondary | ICD-10-CM

## 2017-09-09 DIAGNOSIS — M5126 Other intervertebral disc displacement, lumbar region: Secondary | ICD-10-CM | POA: Diagnosis not present

## 2017-09-09 DIAGNOSIS — Z79899 Other long term (current) drug therapy: Secondary | ICD-10-CM | POA: Insufficient documentation

## 2017-10-15 DIAGNOSIS — M65312 Trigger thumb, left thumb: Secondary | ICD-10-CM | POA: Insufficient documentation

## 2017-11-03 ENCOUNTER — Ambulatory Visit (INDEPENDENT_AMBULATORY_CARE_PROVIDER_SITE_OTHER): Payer: Medicare Other | Admitting: Physician Assistant

## 2017-12-03 ENCOUNTER — Emergency Department (HOSPITAL_COMMUNITY)
Admission: EM | Admit: 2017-12-03 | Discharge: 2017-12-03 | Disposition: A | Discharge status: Home / Self Care | Payer: Medicare Other | Attending: Emergency Medicine | Admitting: Emergency Medicine

## 2017-12-03 ENCOUNTER — Encounter (HOSPITAL_COMMUNITY): Payer: Self-pay | Admitting: Emergency Medicine

## 2017-12-03 ENCOUNTER — Other Ambulatory Visit: Payer: Self-pay

## 2017-12-03 DIAGNOSIS — R300 Dysuria: Secondary | ICD-10-CM | POA: Diagnosis present

## 2017-12-03 DIAGNOSIS — F17210 Nicotine dependence, cigarettes, uncomplicated: Secondary | ICD-10-CM | POA: Diagnosis not present

## 2017-12-03 DIAGNOSIS — E119 Type 2 diabetes mellitus without complications: Secondary | ICD-10-CM | POA: Diagnosis not present

## 2017-12-03 DIAGNOSIS — R739 Hyperglycemia, unspecified: Secondary | ICD-10-CM | POA: Diagnosis not present

## 2017-12-03 DIAGNOSIS — Z7984 Long term (current) use of oral hypoglycemic drugs: Secondary | ICD-10-CM | POA: Diagnosis not present

## 2017-12-03 DIAGNOSIS — Z79899 Other long term (current) drug therapy: Secondary | ICD-10-CM | POA: Insufficient documentation

## 2017-12-03 DIAGNOSIS — Z91040 Latex allergy status: Secondary | ICD-10-CM | POA: Insufficient documentation

## 2017-12-03 DIAGNOSIS — N390 Urinary tract infection, site not specified: Secondary | ICD-10-CM | POA: Insufficient documentation

## 2017-12-03 DIAGNOSIS — J45909 Unspecified asthma, uncomplicated: Secondary | ICD-10-CM | POA: Diagnosis not present

## 2017-12-03 LAB — URINALYSIS, ROUTINE W REFLEX MICROSCOPIC
Bilirubin Urine: NEGATIVE
Glucose, UA: >=500 mg/dL — AB
Ketones, ur: 5 mg/dL — AB
Nitrite: NEGATIVE
Protein, ur: 30 mg/dL — AB
Specific Gravity, Urine: 1.035 — ABNORMAL HIGH (ref 1.005–1.030)
pH: 5 (ref 5.0–8.0)

## 2017-12-03 LAB — CBC WITH DIFFERENTIAL/PLATELET
Basophils Absolute: 0 10*3/uL (ref 0.0–0.1)
Basophils Relative: 0 %
Eosinophils Absolute: 0.2 10*3/uL (ref 0.0–0.7)
Eosinophils Relative: 2 %
HCT: 31 % — ABNORMAL LOW (ref 36.0–46.0)
Hemoglobin: 9.6 g/dL — ABNORMAL LOW (ref 12.0–15.0)
Lymphocytes Relative: 36 %
Lymphs Abs: 5.1 10*3/uL — ABNORMAL HIGH (ref 0.7–4.0)
MCH: 22.5 pg — ABNORMAL LOW (ref 26.0–34.0)
MCHC: 31 g/dL (ref 30.0–36.0)
MCV: 72.6 fL — ABNORMAL LOW (ref 78.0–100.0)
Monocytes Absolute: 0.9 10*3/uL (ref 0.1–1.0)
Monocytes Relative: 6 %
Neutro Abs: 7.8 10*3/uL — ABNORMAL HIGH (ref 1.7–7.7)
Neutrophils Relative %: 56 %
Platelets: 317 10*3/uL (ref 150–400)
RBC: 4.27 MIL/uL (ref 3.87–5.11)
RDW: 16.4 % — ABNORMAL HIGH (ref 11.5–15.5)
WBC: 13.9 10*3/uL — ABNORMAL HIGH (ref 4.0–10.5)

## 2017-12-03 LAB — BASIC METABOLIC PANEL
Anion gap: 8 (ref 5–15)
BUN: 8 mg/dL (ref 6–20)
CO2: 20 mmol/L — ABNORMAL LOW (ref 22–32)
Calcium: 8.8 mg/dL — ABNORMAL LOW (ref 8.9–10.3)
Chloride: 105 mmol/L (ref 101–111)
Creatinine, Ser: 0.73 mg/dL (ref 0.44–1.00)
GFR calc Af Amer: >60 mL/min (ref >60)
GFR calc non Af Amer: >60 mL/min (ref >60)
Glucose, Bld: 343 mg/dL — ABNORMAL HIGH (ref 65–99)
Potassium: 3.6 mmol/L (ref 3.5–5.1)
Sodium: 133 mmol/L — ABNORMAL LOW (ref 135–145)

## 2017-12-03 LAB — POC URINE PREG, ED: Preg Test, Ur: NEGATIVE

## 2017-12-03 LAB — CBG MONITORING, ED: Glucose-Capillary: 395 mg/dL — ABNORMAL HIGH (ref 65–99)

## 2017-12-03 MED ORDER — LIDOCAINE HCL (PF) 1 % IJ SOLN
INTRAMUSCULAR | Status: AC
  Administered 2017-12-03: 2.1 mL
  Filled 2017-12-03: qty 5

## 2017-12-03 MED ORDER — PHENAZOPYRIDINE HCL 200 MG PO TABS: 200.0000 mg | ORAL_TABLET | Freq: Three times a day (TID) | ORAL | 0 refills | Status: DC | PRN

## 2017-12-03 MED ORDER — INSULIN ASPART 100 UNIT/ML ~~LOC~~ SOLN
10.0000 [IU] | Freq: Once | SUBCUTANEOUS | Status: AC
  Administered 2017-12-03: 10 [IU] via SUBCUTANEOUS
  Filled 2017-12-03: qty 1

## 2017-12-03 MED ORDER — CEPHALEXIN 500 MG PO CAPS: 500.0000 mg | ORAL_CAPSULE | Freq: Two times a day (BID) | ORAL | 0 refills | Status: DC

## 2017-12-03 MED ORDER — CEFTRIAXONE SODIUM 1 G IJ SOLR
1.0000 g | Freq: Once | INTRAMUSCULAR | Status: AC
  Administered 2017-12-03: 1 g via INTRAMUSCULAR
  Filled 2017-12-03: qty 10

## 2017-12-03 NOTE — ED Provider Notes (Signed)
[facility] Provider Note   CSN: [id number] Arrival date & time: 12/03/17  0105     History   Chief Complaint Chief Complaint  Patient presents with  . Dysuria    HPI Stephanie Thornton is a 46 y.o. female.  Patient presents to the emergency department for evaluation of urinary frequency, burning with urination.  Symptoms present for 2 or 3 days.  She is a diabetic, blood sugars have been running high.  She felt warm yesterday, thinks she might of had a fever.  No nausea, vomiting or diarrhea.      Past Medical History:  Diagnosis Date  . Anxiety   . Arthritis    "knees, feet, back" (05/24/2015)  . Asthma   . Benign essential HTN 05/01/2015  . Bipolar disorder (HCC) diagnosed early 7490s  . Chronic back pain   . Chronic bronchitis (HCC)    "get it q yr"  . Chronic lower back pain   . Depression   . Diabetes mellitus type II 2010  . GERD (gastroesophageal reflux disease)   . Headache    "maybe 3 times/wk" (05/24/2015)  . High blood pressure   . History of hiatal hernia   . History of stomach ulcers   . Menopause   . Migraine    "1-2/wk" (05/24/2015)  . OSA on CPAP     Patient Active Problem List   Diagnosis Date Noted  . Depression   . Diabetes type 2, controlled (HCC)   . Chronic tonsillitis 05/24/2015  . Uncontrolled hypertension 05/24/2015  . Benign essential HTN 05/01/2015  . Abnormal EKG 05/01/2015  . Preoperative clearance 05/01/2015  . Hx of peritonsillar abscess drainage 01/03/2015  . DM (diabetes mellitus) (HCC) 09/10/2013  . Hyperosmolar non-ketotic state in patient with type 2 diabetes mellitus (HCC) 09/10/2013  . Nausea & vomiting 09/10/2013  . Chest pain 09/10/2013  . Bipolar disorder (HCC)   . Bipolar I disorder, most recent episode (or current) depressed, severe, without mention of psychotic behavior 11/26/2011    Past Surgical History:  Procedure Laterality Date  . ABDOMINAL HYSTERECTOMY  2007  .  BUNIONECTOMY Bilateral   . CARPAL TUNNEL RELEASE Right   . HAMMER TOE SURGERY Left   . TONSILLECTOMY Bilateral 05/24/2015  . TONSILLECTOMY Bilateral 05/24/2015   Procedure: TONSILLECTOMY;  Surgeon: Christia Readingwight Bates, MD;  [facility] OR;  Service: ENT;  Laterality: Bilateral;    OB History    No data available       Home Medications    Prior to Admission medications   Medication Sig Start Date End Date Taking? Authorizing Provider  albuterol (PROVENTIL HFA;VENTOLIN HFA) 108 (90 BASE) MCG/ACT inhaler Inhale 2 puffs into the lungs every 4 (four) hours as needed for wheezing or shortness of breath (wheezing).     [provider]  amLODipine (NORVASC) 10 MG tablet Take 1 tablet (10 mg total) by mouth daily. 05/26/15   Drema DallasWoods, Curtis J, MD  cephALEXin (KEFLEX) 500 MG capsule Take 1 capsule (500 mg total) by mouth 2 (two) times daily. 12/03/17   Gilda CreasePollina, Christopher J, MD  diazepam (VALIUM) 10 MG tablet Take 10 mg by mouth every 8 (eight) hours as needed for anxiety (anxiety). For anxiety    Leilani Ableeese, Betti, MD  FLUoxetine (PROZAC) 10 MG capsule Take 10 mg by mouth every morning.     [provider]  furosemide (LASIX) 20 MG tablet Take 20 mg by mouth daily.    [provider]  hydrochlorothiazide (HYDRODIURIL) 50 MG tablet Take 1 tablet (50 mg total) by mouth daily. 05/26/15   Drema DallasWoods, Curtis J, MD  HYDROcodone-acetaminophen (HYCET) 7.5-325 mg/15 ml solution Take 10-15 mLs by mouth every 4 (four) hours as needed for moderate pain or severe pain. 05/25/15   Christia ReadingBates, Dwight, MD  ibuprofen (ADVIL,MOTRIN) 800 MG tablet Take 1 tablet (800 mg total) by mouth 3 (three) times daily. 11/24/15   Cheri Fowlerose, Kayla, PA-C  irbesartan (AVAPRO) 300 MG tablet Take 1 tablet (300 mg total) by mouth daily. 05/25/15   Drema DallasWoods, Curtis J, MD  meloxicam (MOBIC) 15 MG tablet Take 1 tablet (15 mg total) by mouth daily. 06/28/14   Hyatt, Max T, DPM  metFORMIN (GLUCOPHAGE) 850 MG tablet Take 850 mg by mouth 2 (two)  times daily with a meal.    [provider]  NEOMYCIN-POLYMYXIN-HYDROCORTISONE (CORTISPORIN) 1 % SOLN otic solution Apply 1-2 drops to toe BID after soaking 06/22/15   Hyatt, Max T, DPM  NON FORMULARY Pt uses CPAP machine    [provider]  omeprazole (PRILOSEC) 40 MG capsule Take 1 capsule (40 mg total) by mouth 2 (two) times daily. 09/11/13   Vassie LollMadera, Carlos, MD  Oxycodone HCl 20 MG TABS Take 1 tablet by mouth 5 (five) times daily as needed (pain.).    [provider]  phenazopyridine (PYRIDIUM) 200 MG tablet Take 1 tablet (200 mg total) by mouth 3 (three) times daily as needed for pain. 12/03/17   Gilda CreasePollina, Christopher J, MD  predniSONE (DELTASONE) 50 MG tablet Take daily with breakfast Patient not taking: Reported on 05/16/2015 03/16/15   Ozella RocksMerrell, David J, MD  QUEtiapine (SEROQUEL XR) 300 MG 24 hr tablet Take 600 mg by mouth at bedtime.     [provider]    Family History Family History  Problem Relation Age of Onset  . Bipolar disorder Mother   . CVA Mother   . Bipolar disorder Son   . Depression Brother        schizoaffective d/o  . Hypertension Sister   . Heart disease Maternal Aunt   . Heart attack Maternal Aunt   . Hypertension Brother     Social History Social History   Tobacco Use  . Smoking status: Current Some Day Smoker    Packs/day: 1.00    Years: 30.00    Pack years: 30.00    Types: Cigarettes  . Smokeless tobacco: Never Used  Substance Use Topics  . Alcohol use: No  . Drug use: No     Allergies   Lyrica [pregabalin]; Tramadol; Penicillins; Sulfa antibiotics; and Latex   Review of Systems Review of Systems  Genitourinary: Positive for dysuria and frequency.  All other systems reviewed and are negative.    Physical Exam Updated Vital Signs BP 136/86   Pulse 95   Temp 98.9 F (37.2 C) (Oral)   Resp 14   Ht 5\' 2"  (1.575 m)   Wt 88.9 kg (196 lb)   SpO2 96%   BMI 35.85 kg/m   Physical Exam  Constitutional:  She is oriented to person, place, and time. She appears well-developed and well-nourished. No distress.  HENT:  Head: Normocephalic and atraumatic.  Right Ear: Hearing normal.  Left Ear: Hearing normal.  Nose: Nose normal.  Mouth/Throat: Oropharynx is clear and moist and mucous membranes are normal.  Eyes: Conjunctivae and EOM are normal. Pupils are equal, round, and reactive to light.  Neck: Normal range of motion. Neck supple.  Cardiovascular: Regular rhythm, S1  normal and S2 normal. Exam reveals no gallop and no friction rub.  No murmur heard. Pulmonary/Chest: Effort normal and breath sounds normal. No respiratory distress. She exhibits no tenderness.  Abdominal: Soft. Normal appearance and bowel sounds are normal. There is no hepatosplenomegaly. There is no tenderness. There is no rebound, no guarding, no tenderness at McBurney's point and negative Murphy's sign. No hernia.  Musculoskeletal: Normal range of motion.  Neurological: She is alert and oriented to person, place, and time. She has normal strength. No cranial nerve deficit or sensory deficit. Coordination normal. GCS eye subscore is 4. GCS verbal subscore is 5. GCS motor subscore is 6.  Skin: Skin is warm, dry and intact. No rash noted. No cyanosis.  Psychiatric: She has a normal mood and affect. Her speech is normal and behavior is normal. Thought content normal.  Nursing note and vitals reviewed.    ED Treatments / Results  Labs (all labs ordered are listed, but only abnormal results are displayed) Labs Reviewed  CBC WITH DIFFERENTIAL/PLATELET - Abnormal; Notable for the following components:      Result Value   WBC 13.9 (*)    Hemoglobin 9.6 (*)    HCT 31.0 (*)    MCV 72.6 (*)    MCH 22.5 (*)    RDW 16.4 (*)    Neutro Abs 7.8 (*)    Lymphs Abs 5.1 (*)    All other components within normal limits  BASIC METABOLIC PANEL - Abnormal; Notable for the following components:   Sodium 133 (*)    CO2 20 (*)    Glucose,  Bld 343 (*)    Calcium 8.8 (*)    All other components within normal limits  URINALYSIS, ROUTINE W REFLEX MICROSCOPIC - Abnormal; Notable for the following components:   APPearance CLOUDY (*)    Specific Gravity, Urine 1.035 (*)    Glucose, UA >=500 (*)    Hgb urine dipstick MODERATE (*)    Ketones, ur 5 (*)    Protein, ur 30 (*)    Leukocytes, UA LARGE (*)    Bacteria, UA FEW (*)    Squamous Epithelial / LPF 0-5 (*)    Non Squamous Epithelial 0-5 (*)    All other components within normal limits  CBG MONITORING, ED - Abnormal; Notable for the following components:   Glucose-Capillary 395 (*)    All other components within normal limits  URINE CULTURE  POC URINE PREG, ED    EKG  EKG Interpretation None       Radiology No results found.  Procedures Procedures (including critical care time)  Medications Ordered in ED Medications  insulin aspart (novoLOG) injection 10 Units (not administered)  cefTRIAXone (ROCEPHIN) injection 1 g (1 g Intramuscular Given 12/03/17 0700)  lidocaine (PF) (XYLOCAINE) 1 % injection (2.1 mLs  Given 12/03/17 0700)     Initial Impression / Assessment and Plan / ED Course  I have reviewed the triage vital signs and the nursing notes.  Pertinent labs & imaging results that were available during my care of the patient were reviewed by me and considered in my medical decision making (see chart for details).     Patient presents to the ER for evaluation of urinary frequency and dysuria.  Urinalysis does show obvious infection.  Patient does have a history of diabetes, blood sugar is mildly elevated.  She does not have fever, tachycardia, tachypnea, signs of sepsis at this time.  She does not have CVA tenderness, flank pain, nausea,  vomiting, no signs of pyelonephritis.  She was treated with IM Rocephin and glucose, will be treated with Keflex and prompt follow-up with PCP.  Return for worsening symptoms including high fever, nausea or vomiting.  Return  precautions were discussed with the patient.  Final Clinical Impressions(s) / ED Diagnoses   Final diagnoses:  Urinary tract infection without hematuria, site unspecified  Hyperglycemia    ED Discharge Orders        Ordered    cephALEXin (KEFLEX) 500 MG capsule  2 times daily     12/03/17 0724    phenazopyridine (PYRIDIUM) 200 MG tablet  3 times daily PRN     12/03/17 0724       Gilda Crease, MD 12/03/17 [phone]

## 2017-12-03 NOTE — ED Triage Notes (Signed)
Pt c/o burning sensation and increase frequency with urination states he is been having high blood sugar too. Pt got 120 units of her regular insulin this evening.

## 2017-12-03 NOTE — ED Notes (Addendum)
ED Provider at bedside. 

## 2017-12-04 LAB — URINE CULTURE: Culture: >=100000 — AB

## 2017-12-05 ENCOUNTER — Telehealth: Payer: Self-pay

## 2017-12-05 NOTE — Telephone Encounter (Signed)
Post ED Visit - Positive Culture Follow-up  Culture report reviewed by antimicrobial stewardship pharmacist:  [x]  Enzo BiNathan Batchelder, Pharm.D. []  Celedonio MiyamotoJeremy Frens, Pharm.D., BCPS AQ-ID []  Garvin FilaMike Maccia, Pharm.D., BCPS []  Georgina PillionElizabeth Martin, Pharm.D., BCPS []  Kings Bay BaseMinh Pham, [address] Rainbow BoulevardPharm.D., BCPS, AAHIVP []  Estella HuskMichelle Turner, Pharm.D., BCPS, AAHIVP []  Lysle Pearlachel Rumbarger, PharmD, BCPS []  Casilda Carlsaylor Stone, PharmD, BCPS []  Pollyann SamplesAndy Johnston, PharmD, BCPS  Positive urine culture Treated with Cephalexin, organism sensitive to the same and no further patient follow-up is required at this time.  Jerry CarasCullom, Philo Kurtz Burnett 12/05/2017, 9:34 AM

## 2017-12-09 ENCOUNTER — Ambulatory Visit (INDEPENDENT_AMBULATORY_CARE_PROVIDER_SITE_OTHER): Payer: Medicare Other | Admitting: Physician Assistant

## 2017-12-16 ENCOUNTER — Other Ambulatory Visit (INDEPENDENT_AMBULATORY_CARE_PROVIDER_SITE_OTHER): Payer: Self-pay | Admitting: Physician Assistant

## 2017-12-16 ENCOUNTER — Other Ambulatory Visit: Payer: Self-pay

## 2017-12-16 ENCOUNTER — Ambulatory Visit (INDEPENDENT_AMBULATORY_CARE_PROVIDER_SITE_OTHER): Payer: Medicare Other | Admitting: Physician Assistant

## 2017-12-16 ENCOUNTER — Encounter (INDEPENDENT_AMBULATORY_CARE_PROVIDER_SITE_OTHER): Payer: Self-pay | Admitting: Physician Assistant

## 2017-12-16 VITALS — BP 179/115 | HR 100 | Temp 98.3°F | Ht 63.0 in | Wt 176.8 lb

## 2017-12-16 DIAGNOSIS — Z23 Encounter for immunization: Secondary | ICD-10-CM

## 2017-12-16 DIAGNOSIS — Z760 Encounter for issue of repeat prescription: Secondary | ICD-10-CM

## 2017-12-16 DIAGNOSIS — I10 Essential (primary) hypertension: Secondary | ICD-10-CM

## 2017-12-16 DIAGNOSIS — R739 Hyperglycemia, unspecified: Secondary | ICD-10-CM | POA: Diagnosis not present

## 2017-12-16 DIAGNOSIS — E119 Type 2 diabetes mellitus without complications: Secondary | ICD-10-CM

## 2017-12-16 DIAGNOSIS — Z114 Encounter for screening for human immunodeficiency virus [HIV]: Secondary | ICD-10-CM | POA: Diagnosis not present

## 2017-12-16 DIAGNOSIS — E7841 Elevated Lipoprotein(a): Secondary | ICD-10-CM | POA: Diagnosis not present

## 2017-12-16 LAB — GLUCOSE, POCT (MANUAL RESULT ENTRY)
POC Glucose: 416 mg/dl — AB (ref 70–99)
POC Glucose: 379 mg/dl — AB (ref 70–99)

## 2017-12-16 LAB — POCT GLYCOSYLATED HEMOGLOBIN (HGB A1C): Hemoglobin A1C: 11.7

## 2017-12-16 MED ORDER — CLONAZEPAM 1 MG PO TABS: 1.0000 mg | ORAL_TABLET | Freq: Two times a day (BID) | ORAL | 0 refills | Status: DC | PRN

## 2017-12-16 MED ORDER — GLIPIZIDE 10 MG PO TABS: 10.0000 mg | ORAL_TABLET | Freq: Two times a day (BID) | ORAL | 3 refills | Status: DC

## 2017-12-16 MED ORDER — INSULIN ASPART 100 UNIT/ML ~~LOC~~ SOLN
20.0000 [IU] | Freq: Once | SUBCUTANEOUS | Status: AC
  Administered 2017-12-16: 20 [IU] via SUBCUTANEOUS

## 2017-12-16 MED ORDER — INSULIN GLARGINE 100 UNIT/ML ~~LOC~~ SOLN: SUBCUTANEOUS | 11 refills | Status: DC

## 2017-12-16 MED ORDER — LOSARTAN POTASSIUM 100 MG PO TABS: 100.0000 mg | ORAL_TABLET | Freq: Every day | ORAL | 3 refills | Status: DC

## 2017-12-16 MED ORDER — INSULIN ASPART 100 UNIT/ML ~~LOC~~ SOLN: 5.0000 [IU] | Freq: Three times a day (TID) | SUBCUTANEOUS | 11 refills | Status: DC

## 2017-12-16 MED ORDER — HYDROCHLOROTHIAZIDE 25 MG PO TABS: 25.0000 mg | ORAL_TABLET | Freq: Every day | ORAL | 1 refills | Status: DC

## 2017-12-16 MED ORDER — AMLODIPINE BESYLATE 10 MG PO TABS: 10.0000 mg | ORAL_TABLET | Freq: Every day | ORAL | 0 refills | Status: DC

## 2017-12-16 MED ORDER — SITAGLIPTIN PHOS-METFORMIN HCL 50-1000 MG PO TABS: 1.0000 | ORAL_TABLET | Freq: Two times a day (BID) | ORAL | 3 refills | Status: DC

## 2017-12-16 MED ORDER — PROPRANOLOL HCL 40 MG PO TABS: 40.0000 mg | ORAL_TABLET | Freq: Three times a day (TID) | ORAL | 2 refills | Status: DC

## 2017-12-16 MED ORDER — ALBUTEROL SULFATE HFA 108 (90 BASE) MCG/ACT IN AERS: 2.0000 | INHALATION_SPRAY | RESPIRATORY_TRACT | 5 refills | Status: DC | PRN

## 2017-12-16 MED ORDER — OMEPRAZOLE 40 MG PO CPDR
40.0000 mg | DELAYED_RELEASE_CAPSULE | Freq: Two times a day (BID) | ORAL | 1 refills | Status: DC
Start: 2017-12-16 — End: 2018-02-06

## 2017-12-16 MED ORDER — "INSULIN SYRINGE 29G X 1/2"" 1 ML MISC": 1.0000 | Freq: Every day | 11 refills | Status: DC

## 2017-12-16 NOTE — Progress Notes (Signed)
Subjective:  Patient ID: Stephanie Thornton, female    DOB: 1971/07/07  Age: 46 y.o. MRN: [id number]  CC: HTN and DM  HPI Stephanie Thornton is a 46 y.o. female with a medical history of anxiety, arthritis, asthma, HTN, bipolar disorder, chronic back pain, chronic bronchitis, depression, DM2, GERD, HLD, HTN, hiatal hernia, migraines, and OSA presents as a new patient for management of her DM2 and HTN. Last A1c 10.7% on 05/25/2015. Does not diet or exercise. Takes Janumet 1000 mg BID, glipizide 5 mg, and Regular insulin at 140 units, 100 units, and 100 units throughout the day before meals. Says her glucometer readings are usually in the 400s. She is upset that her treatment regimen is not working for her. Endorses fatigue, polydipsia, polyuria, polyphagia, visual blurring, and tingling. Does not endorse CP, palpitations, SOB, HA, abdominal pain, rash, f/c/n/v, or GI/GU sxs.      Outpatient Medications Prior to Visit  Medication Sig Dispense Refill  . albuterol (PROVENTIL HFA;VENTOLIN HFA) 108 (90 BASE) MCG/ACT inhaler Inhale 2 puffs into the lungs every 4 (four) hours as needed for wheezing or shortness of breath (wheezing).     Marland Kitchen amLODipine (NORVASC) 10 MG tablet Take 1 tablet (10 mg total) by mouth daily. 30 tablet 0  . cephALEXin (KEFLEX) 500 MG capsule Take 1 capsule (500 mg total) by mouth 2 (two) times daily. 20 capsule 0  . diazepam (VALIUM) 10 MG tablet Take 10 mg by mouth every 8 (eight) hours as needed for anxiety (anxiety). For anxiety    . FLUoxetine (PROZAC) 10 MG capsule Take 10 mg by mouth every morning.     . furosemide (LASIX) 20 MG tablet Take 20 mg by mouth daily.    . hydrochlorothiazide (HYDRODIURIL) 50 MG tablet Take 1 tablet (50 mg total) by mouth daily. 30 tablet 0  . HYDROcodone-acetaminophen (HYCET) 7.5-325 mg/15 ml solution Take 10-15 mLs by mouth every 4 (four) hours as needed for moderate pain or severe pain. 473 mL 0  . ibuprofen (ADVIL,MOTRIN) 800 MG tablet Take 1  tablet (800 mg total) by mouth 3 (three) times daily. 21 tablet 0  . irbesartan (AVAPRO) 300 MG tablet Take 1 tablet (300 mg total) by mouth daily. 30 tablet 0  . meloxicam (MOBIC) 15 MG tablet Take 1 tablet (15 mg total) by mouth daily. 30 tablet 3  . metFORMIN (GLUCOPHAGE) 850 MG tablet Take 850 mg by mouth 2 (two) times daily with a meal.    . NEOMYCIN-POLYMYXIN-HYDROCORTISONE (CORTISPORIN) 1 % SOLN otic solution Apply 1-2 drops to toe BID after soaking 10 mL 1  . NON FORMULARY Pt uses CPAP machine    . omeprazole (PRILOSEC) 40 MG capsule Take 1 capsule (40 mg total) by mouth 2 (two) times daily. 60 capsule 1  . Oxycodone HCl 20 MG TABS Take 1 tablet by mouth 5 (five) times daily as needed (pain.).    Marland Kitchen phenazopyridine (PYRIDIUM) 200 MG tablet Take 1 tablet (200 mg total) by mouth 3 (three) times daily as needed for pain. 6 tablet 0  . predniSONE (DELTASONE) 50 MG tablet Take daily with breakfast (Patient not taking: Reported on 05/16/2015) 5 tablet 0  . QUEtiapine (SEROQUEL XR) 300 MG 24 hr tablet Take 600 mg by mouth at bedtime.      No facility-administered medications prior to visit.      ROS Review of Systems  Constitutional: Positive for malaise/fatigue. Negative for chills and fever.  Eyes: Negative for blurred vision.  Respiratory:  Negative for shortness of breath.   Cardiovascular: Negative for chest pain and palpitations.  Gastrointestinal: Negative for abdominal pain and nausea.  Genitourinary: Negative for dysuria and hematuria.  Musculoskeletal: Negative for joint pain and myalgias.  Skin: Negative for rash.  Neurological: Negative for tingling and headaches.  Endo/Heme/Allergies: Positive for polydipsia.  Psychiatric/Behavioral: Negative for depression. The patient is not nervous/anxious.     Objective:   Vitals:   12/16/17 0839  BP: (!) 179/115  Pulse: 100  Temp: 98.3 F (36.8 C)  SpO2: 99%     Physical Exam  Constitutional: She is oriented to person,  place, and time.  Well developed, overweight, NAD, polite  HENT:  Head: Normocephalic and atraumatic.  Eyes: No scleral icterus.  Neck: Normal range of motion. Neck supple. No thyromegaly present.  Cardiovascular: Normal rate, regular rhythm and normal heart sounds. Exam reveals no gallop and no friction rub.  No murmur heard. Pulmonary/Chest: Effort normal and breath sounds normal. No respiratory distress. She has no wheezes.  Musculoskeletal: She exhibits no edema.  Neurological: She is alert and oriented to person, place, and time. No cranial nerve deficit. Coordination normal.  Skin: Skin is warm and dry. No rash noted. No erythema. No pallor.  Psychiatric: She has a normal mood and affect. Her behavior is normal. Thought content normal.  Vitals reviewed.    Assessment & Plan:   1. Type 2 diabetes mellitus without complication, without long-term current use of insulin (HCC) - Glucose (CBG) 416 mg/dL then 409379 mg/dL after administration of Novolog in clinic today - HgB A1c 11.7% in clinic today - Comprehensive metabolic panel - Microalbumin / creatinine urine ratio - insulin aspart (novoLOG) injection 20 Units - insulin glargine (LANTUS) 100 UNIT/ML injection; Use 40 units at bedtime and 20 units 12 hours after bedtime dose.  Dispense: 10 mL; Refill: 11 - INSULIN SYRINGE 1CC/29G 29G X 1/2" 1 ML MISC; 1 each by Does not apply route 5 (five) times daily.  Dispense: 150 each; Refill: 11 - sitaGLIPtin-metformin (JANUMET) 50-1000 MG tablet; Take 1 tablet by mouth 2 (two) times daily with a meal.  Dispense: 180 tablet; Refill: 3 - glipiZIDE (GLUCOTROL) 10 MG tablet; Take 1 tablet (10 mg total) by mouth 2 (two) times daily before a meal.  Dispense: 60 tablet; Refill: 3 - insulin aspart (NOVOLOG) 100 UNIT/ML injection; Inject 5 Units into the skin 3 (three) times daily with meals.  Dispense: 10 mL; Refill: 11  2. Hypertension, unspecified type - Comprehensive metabolic panel - CBC with  Differential - TSH - losartan (COZAAR) 100 MG tablet; Take 1 tablet (100 mg total) by mouth daily.  Dispense: 90 tablet; Refill: 3 - hydrochlorothiazide (HYDRODIURIL) 25 MG tablet; Take 1 tablet (25 mg total) by mouth daily. Take on tablet in the morning.  Dispense: 90 tablet; Refill: 1 - amLODipine (NORVASC) 10 MG tablet; Take 1 tablet (10 mg total) by mouth daily.  Dispense: 30 tablet; Refill: 0 - propranolol (INDERAL) 40 MG tablet; Take 1 tablet (40 mg total) by mouth 3 (three) times daily.  Dispense: 90 tablet; Refill: 2  3. Elevated lipoprotein(a) - Lipid panel  4. Screening for HIV (human immunodeficiency virus) - HIV antibody  5. Need for Tdap vaccination - Tdap vaccine greater than or equal to 7yo IM  6. Need for prophylactic vaccination and inoculation against influenza - Flu Vaccine QUAD 6+ mos PF IM (Fluarix Quad PF); Future  7. Hyperglycemia - Administered 20 units of Novolog in clinic today  8.  Medication refill - omeprazole (PRILOSEC) 40 MG capsule; Take 1 capsule (40 mg total) by mouth 2 (two) times daily.  Dispense: 60 capsule; Refill: 1 - clonazePAM (KLONOPIN) 1 MG tablet; Take 1 tablet (1 mg total) by mouth 2 (two) times daily as needed for anxiety.  Dispense: 14 tablet; Refill: 0   Meds ordered this encounter  Medications  . insulin aspart (novoLOG) injection 20 Units  . omeprazole (PRILOSEC) 40 MG capsule    Sig: Take 1 capsule (40 mg total) by mouth 2 (two) times daily.    Dispense:  60 capsule    Refill:  1    Order Specific Question:   Supervising Provider    Answer:   Quentin Angst [id number]  . insulin glargine (LANTUS) 100 UNIT/ML injection    Sig: Use 40 units at bedtime and 20 units 12 hours after bedtime dose.    Dispense:  10 mL    Refill:  11    Order Specific Question:   Supervising Provider    Answer:   Quentin Angst [id number]  . INSULIN SYRINGE 1CC/29G 29G X 1/2" 1 ML MISC    Sig: 1 each by Does not apply route 5 (five) times  daily.    Dispense:  150 each    Refill:  11    Order Specific Question:   Supervising Provider    Answer:   Quentin Angst [id number]  . sitaGLIPtin-metformin (JANUMET) 50-1000 MG tablet    Sig: Take 1 tablet by mouth 2 (two) times daily with a meal.    Dispense:  180 tablet    Refill:  3    Order Specific Question:   Supervising Provider    Answer:   Quentin Angst [id number]  . glipiZIDE (GLUCOTROL) 10 MG tablet    Sig: Take 1 tablet (10 mg total) by mouth 2 (two) times daily before a meal.    Dispense:  60 tablet    Refill:  3    Order Specific Question:   Supervising Provider    Answer:   Quentin Angst [id number]  . insulin aspart (NOVOLOG) 100 UNIT/ML injection    Sig: Inject 5 Units into the skin 3 (three) times daily with meals.    Dispense:  10 mL    Refill:  11    Order Specific Question:   Supervising Provider    Answer:   Quentin Angst [id number]  . losartan (COZAAR) 100 MG tablet    Sig: Take 1 tablet (100 mg total) by mouth daily.    Dispense:  90 tablet    Refill:  3    Order Specific Question:   Supervising Provider    Answer:   Quentin Angst [id number]  . hydrochlorothiazide (HYDRODIURIL) 25 MG tablet    Sig: Take 1 tablet (25 mg total) by mouth daily. Take on tablet in the morning.    Dispense:  90 tablet    Refill:  1    Order Specific Question:   Supervising Provider    Answer:   Quentin Angst [id number]  . amLODipine (NORVASC) 10 MG tablet    Sig: Take 1 tablet (10 mg total) by mouth daily.    Dispense:  30 tablet    Refill:  0    Order Specific Question:   Supervising Provider    Answer:   Quentin Angst [id number]  . propranolol (INDERAL) 40 MG tablet    Sig: Take 1 tablet (40 mg total) by mouth  3 (three) times daily.    Dispense:  90 tablet    Refill:  2    Order Specific Question:   Supervising Provider    Answer:   Quentin AngstJEGEDE, OLUGBEMIGA E [id number]  . clonazePAM (KLONOPIN) 1 MG tablet    Sig: Take 1 tablet  (1 mg total) by mouth 2 (two) times daily as needed for anxiety.    Dispense:  14 tablet    Refill:  0    Order Specific Question:   Supervising Provider    Answer:   Quentin AngstJEGEDE, OLUGBEMIGA E [id number]    Follow-up: Return in about 4 weeks (around 01/13/2018) for diabetes and HTN.   Loletta Specteroger David Gomez PA

## 2017-12-16 NOTE — Patient Instructions (Addendum)
Use Novolog as sliding scale before meals.  If sugar 150-200 take 2 units If sugar 201-251 take 4 units If sugar 251-300 take 6 units If sugar 301-350 take 8 units If sugar 351-400 take 10 units     Diabetes Mellitus and Food It is important for you to manage your blood sugar (glucose) level. Your blood glucose level can be greatly affected by what you eat. Eating healthier foods in the appropriate amounts throughout the day at about the same time each day will help you control your blood glucose level. It can also help slow or prevent worsening of your diabetes mellitus. Healthy eating may even help you improve the level of your blood pressure and reach or maintain a healthy weight. General recommendations for healthful eating and cooking habits include:  Eating meals and snacks regularly. Avoid going long periods of time without eating to lose weight.  Eating a diet that consists mainly of plant-based foods, such as fruits, vegetables, nuts, legumes, and whole grains.  Using low-heat cooking methods, such as baking, instead of high-heat cooking methods, such as deep frying.  Work with your dietitian to make sure you understand how to use the Nutrition Facts information on food labels. How can food affect me? Carbohydrates Carbohydrates affect your blood glucose level more than any other type of food. Your dietitian will help you determine how many carbohydrates to eat at each meal and teach you how to count carbohydrates. Counting carbohydrates is important to keep your blood glucose at a healthy level, especially if you are using insulin or taking certain medicines for diabetes mellitus. Alcohol Alcohol can cause sudden decreases in blood glucose (hypoglycemia), especially if you use insulin or take certain medicines for diabetes mellitus. Hypoglycemia can be a life-threatening condition. Symptoms of hypoglycemia (sleepiness, dizziness, and disorientation) are similar to symptoms of  having too much alcohol. If your health care provider has given you approval to drink alcohol, do so in moderation and use the following guidelines:  Women should not have more than one drink per day, and men should not have more than two drinks per day. One drink is equal to: ? 12 oz of beer. ? 5 oz of wine. ? 1 oz of hard liquor.  Do not drink on an empty stomach.  Keep yourself hydrated. Have water, diet soda, or unsweetened iced tea.  Regular soda, juice, and other mixers might contain a lot of carbohydrates and should be counted.  What foods are not recommended? As you make food choices, it is important to remember that all foods are not the same. Some foods have fewer nutrients per serving than other foods, even though they might have the same number of calories or carbohydrates. It is difficult to get your body what it needs when you eat foods with fewer nutrients. Examples of foods that you should avoid that are high in calories and carbohydrates but low in nutrients include:  Trans fats (most processed foods list trans fats on the Nutrition Facts label).  Regular soda.  Juice.  Candy.  Sweets, such as cake, pie, doughnuts, and cookies.  Fried foods.  What foods can I eat? Eat nutrient-rich foods, which will nourish your body and keep you healthy. The food you should eat also will depend on several factors, including:  The calories you need.  The medicines you take.  Your weight.  Your blood glucose level.  Your blood pressure level.  Your cholesterol level.  You should eat a variety of foods,  including:  Protein. ? Lean cuts of meat. ? Proteins low in saturated fats, such as fish, egg whites, and beans. Avoid processed meats.  Fruits and vegetables. ? Fruits and vegetables that may help control blood glucose levels, such as apples, mangoes, and yams.  Dairy products. ? Choose fat-free or low-fat dairy products, such as milk, yogurt, and  cheese.  Grains, bread, pasta, and rice. ? Choose whole grain products, such as multigrain bread, whole oats, and brown rice. These foods may help control blood pressure.  Fats. ? Foods containing healthful fats, such as nuts, avocado, olive oil, canola oil, and fish.  Does everyone with diabetes mellitus have the same meal plan? Because every person with diabetes mellitus is different, there is not one meal plan that works for everyone. It is very important that you meet with a dietitian who will help you create a meal plan that is just right for you. This information is not intended to replace advice given to you by your health care provider. Make sure you discuss any questions you have with your health care provider. Document Released: 09/12/2005 Document Revised: 05/23/2016 Document Reviewed: 11/12/2013 Elsevier Interactive Patient Education  2017 [employer]

## 2017-12-17 LAB — MICROALBUMIN / CREATININE URINE RATIO
Creatinine, Urine: 66.3 mg/dL
Microalb/Creat Ratio: <4.5 mg/g creat (ref 0.0–30.0)
Microalbumin, Urine: <3 ug/mL

## 2017-12-17 LAB — CBC WITH DIFFERENTIAL/PLATELET
Basophils Absolute: 0 10*3/uL (ref 0.0–0.2)
Basos: 0 %
EOS (ABSOLUTE): 0.2 10*3/uL (ref 0.0–0.4)
Eos: 2 %
Hematocrit: 33.7 % — ABNORMAL LOW (ref 34.0–46.6)
Hemoglobin: 9.7 g/dL — ABNORMAL LOW (ref 11.1–15.9)
Immature Grans (Abs): 0 10*3/uL (ref 0.0–0.1)
Immature Granulocytes: 0 %
Lymphocytes Absolute: 2.7 10*3/uL (ref 0.7–3.1)
Lymphs: 27 %
MCH: 21.7 pg — ABNORMAL LOW (ref 26.6–33.0)
MCHC: 28.8 g/dL — ABNORMAL LOW (ref 31.5–35.7)
MCV: 76 fL — ABNORMAL LOW (ref 79–97)
Monocytes Absolute: 0.3 10*3/uL (ref 0.1–0.9)
Monocytes: 3 %
Neutrophils Absolute: 6.6 10*3/uL (ref 1.4–7.0)
Neutrophils: 68 %
Platelets: 365 10*3/uL (ref 150–379)
RBC: 4.46 x10E6/uL (ref 3.77–5.28)
RDW: 17.2 % — ABNORMAL HIGH (ref 12.3–15.4)
WBC: 9.9 10*3/uL (ref 3.4–10.8)

## 2017-12-17 LAB — COMPREHENSIVE METABOLIC PANEL
ALT: 13 IU/L (ref 0–32)
AST: 11 IU/L (ref 0–40)
Albumin/Globulin Ratio: 1.2 (ref 1.2–2.2)
Albumin: 3.8 g/dL (ref 3.5–5.5)
Alkaline Phosphatase: 155 IU/L — ABNORMAL HIGH (ref 39–117)
BUN/Creatinine Ratio: 11 (ref 9–23)
BUN: 10 mg/dL (ref 6–24)
Bilirubin Total: <0.2 mg/dL (ref 0.0–1.2)
CO2: 16 mmol/L — ABNORMAL LOW (ref 20–29)
Calcium: 9 mg/dL (ref 8.7–10.2)
Chloride: 105 mmol/L (ref 96–106)
Creatinine, Ser: 0.89 mg/dL (ref 0.57–1.00)
GFR calc Af Amer: 91 mL/min/{1.73_m2} (ref >59)
GFR calc non Af Amer: 79 mL/min/{1.73_m2} (ref >59)
Globulin, Total: 3.2 g/dL (ref 1.5–4.5)
Glucose: 385 mg/dL — ABNORMAL HIGH (ref 65–99)
Potassium: 4.3 mmol/L (ref 3.5–5.2)
Sodium: 140 mmol/L (ref 134–144)
Total Protein: 7 g/dL (ref 6.0–8.5)

## 2017-12-17 LAB — TSH: TSH: 0.736 u[IU]/mL (ref 0.450–4.500)

## 2017-12-17 LAB — LIPID PANEL
Chol/HDL Ratio: 4.7 ratio — ABNORMAL HIGH (ref 0.0–4.4)
Cholesterol, Total: 200 mg/dL — ABNORMAL HIGH (ref 100–199)
HDL: 43 mg/dL (ref >39)
LDL Calculated: 123 mg/dL — ABNORMAL HIGH (ref 0–99)
Triglycerides: 171 mg/dL — ABNORMAL HIGH (ref 0–149)
VLDL Cholesterol Cal: 34 mg/dL (ref 5–40)

## 2017-12-17 LAB — HIV ANTIBODY (ROUTINE TESTING W REFLEX): HIV Screen 4th Generation wRfx: NONREACTIVE

## 2017-12-19 ENCOUNTER — Telehealth (INDEPENDENT_AMBULATORY_CARE_PROVIDER_SITE_OTHER): Payer: Self-pay

## 2017-12-19 ENCOUNTER — Other Ambulatory Visit (INDEPENDENT_AMBULATORY_CARE_PROVIDER_SITE_OTHER): Payer: Self-pay | Admitting: Physician Assistant

## 2017-12-19 DIAGNOSIS — D649 Anemia, unspecified: Secondary | ICD-10-CM

## 2017-12-19 DIAGNOSIS — E7841 Elevated Lipoprotein(a): Secondary | ICD-10-CM

## 2017-12-19 MED ORDER — LOVASTATIN 40 MG PO TABS: 40.0000 mg | ORAL_TABLET | Freq: Every day | ORAL | 3 refills | Status: DC

## 2017-12-19 MED ORDER — FERROUS SULFATE 325 (65 FE) MG PO TBEC: 325.0000 mg | DELAYED_RELEASE_TABLET | Freq: Three times a day (TID) | ORAL | 0 refills | Status: DC

## 2017-12-19 NOTE — Telephone Encounter (Signed)
-----   Message from Loletta Specteroger David Gomez, PA-C sent at 12/19/2017  9:11 AM EST ----- Patient has anemia and elevated choleseterol. I will send to [facility] in [place].

## 2017-12-19 NOTE — Telephone Encounter (Signed)
Sent to [facility]. Thanks.

## 2017-12-19 NOTE — Telephone Encounter (Signed)
Patient is aware of being anemic and having elevared cholesterol. She knows medication has been sent. [facility] [address]. Maryjean Mornempestt S Roberts, CMA

## 2018-01-13 ENCOUNTER — Ambulatory Visit (INDEPENDENT_AMBULATORY_CARE_PROVIDER_SITE_OTHER): Payer: Medicare Other | Admitting: Physician Assistant

## 2018-01-19 ENCOUNTER — Other Ambulatory Visit (INDEPENDENT_AMBULATORY_CARE_PROVIDER_SITE_OTHER): Payer: Self-pay | Admitting: Physician Assistant

## 2018-01-19 DIAGNOSIS — I10 Essential (primary) hypertension: Secondary | ICD-10-CM

## 2018-01-19 DIAGNOSIS — D649 Anemia, unspecified: Secondary | ICD-10-CM

## 2018-01-19 NOTE — Telephone Encounter (Signed)
FWD to PCP. Lesslie Mckeehan S Dillon Mcreynolds, CMA  

## 2018-02-06 ENCOUNTER — Other Ambulatory Visit (INDEPENDENT_AMBULATORY_CARE_PROVIDER_SITE_OTHER): Payer: Self-pay | Admitting: Physician Assistant

## 2018-02-06 DIAGNOSIS — Z760 Encounter for issue of repeat prescription: Secondary | ICD-10-CM

## 2018-02-10 ENCOUNTER — Ambulatory Visit (INDEPENDENT_AMBULATORY_CARE_PROVIDER_SITE_OTHER): Payer: Medicare Other | Admitting: Physician Assistant

## 2018-02-13 ENCOUNTER — Encounter (INDEPENDENT_AMBULATORY_CARE_PROVIDER_SITE_OTHER): Payer: Self-pay | Admitting: Physician Assistant

## 2018-02-13 ENCOUNTER — Ambulatory Visit (INDEPENDENT_AMBULATORY_CARE_PROVIDER_SITE_OTHER): Payer: Medicare Other | Admitting: Physician Assistant

## 2018-02-13 VITALS — BP 102/65 | HR 94 | Temp 98.0°F | Resp 18 | Ht 62.0 in | Wt 168.0 lb

## 2018-02-13 DIAGNOSIS — E119 Type 2 diabetes mellitus without complications: Secondary | ICD-10-CM | POA: Diagnosis not present

## 2018-02-13 DIAGNOSIS — I10 Essential (primary) hypertension: Secondary | ICD-10-CM

## 2018-02-13 DIAGNOSIS — R21 Rash and other nonspecific skin eruption: Secondary | ICD-10-CM

## 2018-02-13 DIAGNOSIS — Z794 Long term (current) use of insulin: Secondary | ICD-10-CM | POA: Diagnosis not present

## 2018-02-13 DIAGNOSIS — R300 Dysuria: Secondary | ICD-10-CM

## 2018-02-13 LAB — POCT URINALYSIS DIPSTICK
Bilirubin, UA: NEGATIVE
Blood, UA: NEGATIVE
Glucose, UA: >1000
Ketones, UA: NEGATIVE
Nitrite, UA: NEGATIVE
Protein, UA: NEGATIVE
Spec Grav, UA: >=1.03 — AB (ref 1.010–1.025)
Urobilinogen, UA: 0.2 E.U./dL
pH, UA: 5.5 (ref 5.0–8.0)

## 2018-02-13 LAB — GLUCOSE, POCT (MANUAL RESULT ENTRY): POC Glucose: 279 mg/dl — AB (ref 70–99)

## 2018-02-13 MED ORDER — BLOOD GLUCOSE MONITOR KIT: PACK | 0 refills | Status: DC

## 2018-02-13 MED ORDER — INSULIN GLARGINE 100 UNIT/ML ~~LOC~~ SOLN: SUBCUTANEOUS | 11 refills | Status: DC

## 2018-02-13 MED ORDER — HYDROCHLOROTHIAZIDE 12.5 MG PO TABS: 12.5000 mg | ORAL_TABLET | Freq: Every day | ORAL | 3 refills | Status: DC

## 2018-02-13 MED ORDER — HYDROXYZINE HCL 25 MG PO TABS: 25.0000 mg | ORAL_TABLET | Freq: Two times a day (BID) | ORAL | 0 refills | Status: DC

## 2018-02-13 NOTE — Progress Notes (Signed)
Subjective:  Patient ID: Stephanie Thornton, female    DOB: Aug 27, 1971  Age: 47 y.o. MRN: [id number]  CC: DM and HTN  HPI Stephanie Thornton is a 47 y.o. female with a medical history of anxiety, arthritis, asthma, HTN, bipolar disorder, chronic back pain, chronic bronchitis, depression, DM2, GERD, HLD, HTN, hiatal hernia, migraines, and OSA presents on f/u of DM and HTN. Last A1c 11.7% and last CBG 416 two months ago. CBG 279 today. Reports glucose low 130, high was "to high to read", regular "200s". Please that she is no longer regularly in the 400s. Has not checked blood sugar in the last two weeks because her child dropped the glucometer in the toilet. Endorses urinary frequency, suprapubic pressure, dysuria, and dribbling for two weeks. Also has a pruritic rash affecting the arms, upper chest, and upper back for two weeks. Applied hydrocortisone with some relief. Does not endorse anaphylactic symptoms. No other symptoms or complaints reported.    Outpatient Medications Prior to Visit  Medication Sig Dispense Refill  . albuterol (PROVENTIL HFA;VENTOLIN HFA) 108 (90 Base) MCG/ACT inhaler Inhale 2 puffs into the lungs every 4 (four) hours as needed for wheezing or shortness of breath (wheezing). 1 Inhaler 5  . amLODipine (NORVASC) 10 MG tablet TAKE 1 TABLET BY MOUTH EVERY DAY 30 tablet 0  . clonazePAM (KLONOPIN) 1 MG tablet Take 1 tablet (1 mg total) by mouth 2 (two) times daily as needed for anxiety. 14 tablet 0  . FEROSUL 325 (65 Fe) MG tablet TAKE 1 TABLET BY MOUTH THREE TIMES DAILY WITH FOOD 90 tablet 0  . FLUoxetine (PROZAC) 10 MG capsule Take 10 mg by mouth every morning.     Marland Kitchen glipiZIDE (GLUCOTROL) 10 MG tablet Take 1 tablet (10 mg total) by mouth 2 (two) times daily before a meal. 60 tablet 3  . hydrochlorothiazide (HYDRODIURIL) 25 MG tablet Take 1 tablet (25 mg total) by mouth daily. Take on tablet in the morning. 90 tablet 1  . insulin aspart (NOVOLOG) 100 UNIT/ML injection Inject 5 Units  into the skin 3 (three) times daily with meals. 10 mL 11  . insulin glargine (LANTUS) 100 UNIT/ML injection Use 40 units at bedtime and 20 units 12 hours after bedtime dose. 10 mL 11  . INSULIN SYRINGE 1CC/29G 29G X 1/2" 1 ML MISC 1 each by Does not apply route 5 (five) times daily. 150 each 11  . losartan (COZAAR) 100 MG tablet Take 1 tablet (100 mg total) by mouth daily. 90 tablet 3  . lovastatin (MEVACOR) 40 MG tablet Take 1 tablet (40 mg total) by mouth at bedtime. 90 tablet 3  . meloxicam (MOBIC) 15 MG tablet Take 1 tablet (15 mg total) by mouth daily. 30 tablet 3  . omeprazole (PRILOSEC) 40 MG capsule Take 1 capsule (40 mg total) by mouth 2 (two) times daily. 60 capsule 1  . Oxycodone HCl 20 MG TABS Take 1 tablet by mouth 5 (five) times daily as needed (pain.).    Marland Kitchen propranolol (INDERAL) 40 MG tablet Take 1 tablet (40 mg total) by mouth 3 (three) times daily. 90 tablet 2  . QUEtiapine (SEROQUEL XR) 300 MG 24 hr tablet Take 600 mg by mouth at bedtime.     . sitaGLIPtin-metformin (JANUMET) 50-1000 MG tablet Take 1 tablet by mouth 2 (two) times daily with a meal. 180 tablet 3   No facility-administered medications prior to visit.      ROS Review of Systems  Constitutional: Negative  for chills, fever and malaise/fatigue.  Eyes: Negative for blurred vision.  Respiratory: Negative for shortness of breath.   Cardiovascular: Negative for chest pain and palpitations.  Gastrointestinal: Negative for abdominal pain and nausea.  Genitourinary: Positive for dysuria and frequency. Negative for hematuria and urgency.  Musculoskeletal: Negative for joint pain and myalgias.  Skin: Negative for rash.  Neurological: Negative for tingling and headaches.  Psychiatric/Behavioral: Negative for depression. The patient is not nervous/anxious.     Objective:  BP 102/65 (BP Location: Left Arm, Patient Position: Sitting, Cuff Size: Normal)   Pulse 94   Temp 98 F (36.7 C) (Oral)   Resp 18   Ht 5\' 2"   (1.575 m)   Wt 168 lb (76.2 kg)   SpO2 97%   BMI 30.73 kg/m   BP/Weight 02/13/2018 12/16/2017 12/03/2017  Systolic BP 102 179 168  Diastolic BP 65 115 105  Wt. (Lbs) 168 176.8 196  BMI 30.73 31.32 35.85  Some encounter information is confidential and restricted. Go to Review Flowsheets activity to see all data.      Physical Exam  Constitutional: She is oriented to person, place, and time.  Well developed, overweight, NAD, polite  HENT:  Head: Normocephalic and atraumatic.  Eyes: Conjunctivae are normal. No scleral icterus.  Neck: Normal range of motion. Neck supple. No thyromegaly present.  Cardiovascular: Normal rate, regular rhythm and normal heart sounds.  Pulmonary/Chest: Effort normal and breath sounds normal. No respiratory distress.  Musculoskeletal: She exhibits no edema.  Neurological: She is alert and oriented to person, place, and time. No cranial nerve deficit. Coordination normal.  Skin: Skin is warm and dry. No rash noted. No erythema. No pallor.  Psychiatric: She has a normal mood and affect. Her behavior is normal. Thought content normal.  Vitals reviewed.    Assessment & Plan:    1. Type 2 diabetes mellitus without complication, with long-term current use of insulin (HCC) - Glucose (CBG) 279 in clinic today - Increase insulin glargine (LANTUS) 100 UNIT/ML injection; Use 40 units at bedtime and 40 units 12 hours after bedtime dose.  Dispense: 10 mL; Refill: 11  2. Rash and nonspecific skin eruption - Begin Hydroxyzine 25 mg BID x5 days. Use OTC Hydrocortisone on pruritic lesions  3. Hypertension, unspecified type - Decrease hydrochlorothiazide (HYDRODIURIL) 12.5 MG tablet; Take 1 tablet (12.5 mg total) by mouth daily.  Dispense: 90 tablet; Refill: 3  4. Dysuria - Urine Culture - Urinalysis Dipstick >1000 glucose, trace leukocytes   Meds ordered this encounter  Medications  . hydrochlorothiazide (HYDRODIURIL) 12.5 MG tablet    Sig: Take 1 tablet  (12.5 mg total) by mouth daily.    Dispense:  90 tablet    Refill:  3    Order Specific Question:   Supervising Provider    Answer:   Quentin AngstJEGEDE, OLUGBEMIGA E [id number]]  . insulin glargine (LANTUS) 100 UNIT/ML injection    Sig: Use 40 units at bedtime and 40 units 12 hours after bedtime dose.    Dispense:  10 mL    Refill:  11    Order Specific Question:   Supervising Provider    Answer:   Quentin AngstJEGEDE, OLUGBEMIGA E [[id number]]    Follow-up: 6 weeks for DM and A1c  Loletta Specteroger David Gomez PA

## 2018-02-13 NOTE — Patient Instructions (Signed)

## 2018-02-13 NOTE — Progress Notes (Signed)
yelloPatient reports CBG this morning being 205 prior to taking insulin and eating.

## 2018-02-15 LAB — URINE CULTURE

## 2018-02-16 ENCOUNTER — Other Ambulatory Visit (INDEPENDENT_AMBULATORY_CARE_PROVIDER_SITE_OTHER): Payer: Self-pay | Admitting: Physician Assistant

## 2018-02-16 DIAGNOSIS — N3000 Acute cystitis without hematuria: Secondary | ICD-10-CM

## 2018-02-16 MED ORDER — CIPROFLOXACIN HCL 500 MG PO TABS: 500.0000 mg | ORAL_TABLET | Freq: Two times a day (BID) | ORAL | 0 refills | Status: AC

## 2018-02-18 ENCOUNTER — Emergency Department (HOSPITAL_COMMUNITY)
Admission: EM | Admit: 2018-02-18 | Discharge: 2018-02-18 | Disposition: A | Discharge status: Home / Self Care | Payer: Medicare Other | Attending: Emergency Medicine | Admitting: Emergency Medicine

## 2018-02-18 ENCOUNTER — Other Ambulatory Visit: Payer: Self-pay

## 2018-02-18 ENCOUNTER — Encounter (HOSPITAL_COMMUNITY): Payer: Self-pay | Admitting: Emergency Medicine

## 2018-02-18 DIAGNOSIS — J45909 Unspecified asthma, uncomplicated: Secondary | ICD-10-CM | POA: Diagnosis not present

## 2018-02-18 DIAGNOSIS — Z91040 Latex allergy status: Secondary | ICD-10-CM | POA: Diagnosis not present

## 2018-02-18 DIAGNOSIS — Z79899 Other long term (current) drug therapy: Secondary | ICD-10-CM | POA: Diagnosis not present

## 2018-02-18 DIAGNOSIS — Z794 Long term (current) use of insulin: Secondary | ICD-10-CM | POA: Diagnosis not present

## 2018-02-18 DIAGNOSIS — I10 Essential (primary) hypertension: Secondary | ICD-10-CM | POA: Insufficient documentation

## 2018-02-18 DIAGNOSIS — R2232 Localized swelling, mass and lump, left upper limb: Secondary | ICD-10-CM | POA: Diagnosis present

## 2018-02-18 DIAGNOSIS — E119 Type 2 diabetes mellitus without complications: Secondary | ICD-10-CM | POA: Insufficient documentation

## 2018-02-18 DIAGNOSIS — F17210 Nicotine dependence, cigarettes, uncomplicated: Secondary | ICD-10-CM | POA: Diagnosis not present

## 2018-02-18 DIAGNOSIS — W4904XA Ring or other jewelry causing external constriction, initial encounter: Secondary | ICD-10-CM | POA: Diagnosis not present

## 2018-02-18 NOTE — ED Provider Notes (Signed)
[facility] EMERGENCY DEPARTMENT Provider Note   CSN: [id number] Arrival date & time: 02/18/18  1221     History   Chief Complaint Chief Complaint  Patient presents with  . Ring Stuck On Finger    HPI Stephanie Thornton is a 47 y.o. female.  HPI   47 year old female presents today with a ring stuck on her left finger.  Patient notes that usually she takes her ring off before she goes to sleep as her hands swell.  She notes she forgot to take her wedding ring off of her finger.  She woke up this morning with swelling and pain over the finger, no discoloration, no loss of sensation.  She was unable to remove this at home.  Past Medical History:  Diagnosis Date  . Anxiety   . Arthritis    "knees, feet, back" (05/24/2015)  . Asthma   . Benign essential HTN 05/01/2015  . Bipolar disorder ([place]) diagnosed early 16s  . Chronic back pain   . Chronic bronchitis ([place])    "get it q yr"  . Chronic lower back pain   . Depression   . Diabetes mellitus type II 2010  . GERD (gastroesophageal reflux disease)   . Headache    "maybe 3 times/wk" (05/24/2015)  . High blood pressure   . History of hiatal hernia   . History of stomach ulcers   . Menopause   . Migraine    "1-2/wk" (05/24/2015)  . OSA on CPAP     Patient Active Problem List   Diagnosis Date Noted  . Depression   . Diabetes type 2, controlled ([place])   . Chronic tonsillitis 05/24/2015  . Uncontrolled hypertension 05/24/2015  . Benign essential HTN 05/01/2015  . Abnormal EKG 05/01/2015  . Preoperative clearance 05/01/2015  . Hx of peritonsillar abscess drainage 01/03/2015  . DM (diabetes mellitus) ([place]) 09/10/2013  . Hyperosmolar non-ketotic state in patient with type 2 diabetes mellitus ([place]) 09/10/2013  . Nausea & vomiting 09/10/2013  . Chest pain 09/10/2013  . Bipolar disorder ([place])   . Bipolar I disorder, most recent episode (or current) depressed, severe, without mention of psychotic behavior  11/26/2011    Past Surgical History:  Procedure Laterality Date  . ABDOMINAL HYSTERECTOMY  2007  . BUNIONECTOMY Bilateral   . CARPAL TUNNEL RELEASE Right   . HAMMER TOE SURGERY Left   . TONSILLECTOMY Bilateral 05/24/2015  . TONSILLECTOMY Bilateral 05/24/2015   Procedure: TONSILLECTOMY;  Surgeon: Melida Quitter, MD;  Location: [facility];  Service: ENT;  Laterality: Bilateral;    OB History    No data available       Home Medications    Prior to Admission medications   Medication Sig Start Date End Date Taking? Authorizing Provider  albuterol (PROVENTIL HFA;VENTOLIN HFA) 108 (90 Base) MCG/ACT inhaler Inhale 2 puffs into the lungs every 4 (four) hours as needed for wheezing or shortness of breath (wheezing). 12/16/17   Clent Demark, PA-C  amLODipine (NORVASC) 10 MG tablet TAKE 1 TABLET BY MOUTH EVERY DAY 01/20/18   Clent Demark, PA-C  blood glucose meter kit and supplies KIT Dispense based on patient and insurance preference. Use up to four times daily as directed. (FOR ICD-9 250.00, 250.01). 02/13/18   Clent Demark, PA-C  ciprofloxacin (CIPRO) 500 MG tablet Take 1 tablet (500 mg total) by mouth 2 (two) times daily for 3 days. 02/16/18 02/19/18  Clent Demark, PA-C  clonazePAM (KLONOPIN) 1 MG tablet Take  1 tablet (1 mg total) by mouth 2 (two) times daily as needed for anxiety. 12/16/17   Clent Demark, PA-C  FEROSUL 325 (65 Fe) MG tablet TAKE 1 TABLET BY MOUTH THREE TIMES DAILY WITH FOOD 01/20/18   Clent Demark, PA-C  FLUoxetine (PROZAC) 10 MG capsule Take 10 mg by mouth every morning.     [provider]  glipiZIDE (GLUCOTROL) 10 MG tablet Take 1 tablet (10 mg total) by mouth 2 (two) times daily before a meal. 12/16/17   Clent Demark, PA-C  hydrochlorothiazide (HYDRODIURIL) 12.5 MG tablet Take 1 tablet (12.5 mg total) by mouth daily. 02/13/18   Clent Demark, PA-C  hydrOXYzine (ATARAX/VISTARIL) 25 MG tablet Take 1 tablet (25 mg total) by mouth 2  (two) times daily. 02/13/18   Clent Demark, PA-C  insulin aspart (NOVOLOG) 100 UNIT/ML injection Inject 5 Units into the skin 3 (three) times daily with meals. 12/16/17   Clent Demark, PA-C  insulin glargine (LANTUS) 100 UNIT/ML injection Use 40 units at bedtime and 40 units 12 hours after bedtime dose. 02/13/18   Clent Demark, PA-C  INSULIN SYRINGE 1CC/29G 29G X 1/2" 1 ML MISC 1 each by Does not apply route 5 (five) times daily. 12/16/17   Clent Demark, PA-C  losartan (COZAAR) 100 MG tablet Take 1 tablet (100 mg total) by mouth daily. 12/16/17   Clent Demark, PA-C  lovastatin (MEVACOR) 40 MG tablet Take 1 tablet (40 mg total) by mouth at bedtime. 12/19/17   Clent Demark, PA-C  meloxicam (MOBIC) 15 MG tablet Take 1 tablet (15 mg total) by mouth daily. 06/28/14   Hyatt, Max T, DPM  omeprazole (PRILOSEC) 40 MG capsule Take 1 capsule (40 mg total) by mouth 2 (two) times daily. 12/16/17   Clent Demark, PA-C  Oxycodone HCl 20 MG TABS Take 1 tablet by mouth 5 (five) times daily as needed (pain.).    [provider]  propranolol (INDERAL) 40 MG tablet Take 1 tablet (40 mg total) by mouth 3 (three) times daily. 12/16/17   Clent Demark, PA-C  QUEtiapine (SEROQUEL XR) 300 MG 24 hr tablet Take 600 mg by mouth at bedtime.     [provider]  sitaGLIPtin-metformin (JANUMET) 50-1000 MG tablet Take 1 tablet by mouth 2 (two) times daily with a meal. 12/16/17   Clent Demark, PA-C    Family History Family History  Problem Relation Age of Onset  . Bipolar disorder Mother   . CVA Mother   . Bipolar disorder Son   . Depression Brother        schizoaffective d/o  . Hypertension Sister   . Heart disease Maternal Aunt   . Heart attack Maternal Aunt   . Hypertension Brother     Social History Social History   Tobacco Use  . Smoking status: Current Some Day Smoker    Packs/day: 1.00    Years: 30.00    Pack years: 30.00    Types:  Cigarettes  . Smokeless tobacco: Never Used  Substance Use Topics  . Alcohol use: No  . Drug use: No     Allergies   Lyrica [pregabalin]; Rocephin [ceftriaxone sodium in dextrose]; Tramadol; Keflex [cephalexin]; Penicillins; Sulfa antibiotics; and Latex   Review of Systems Review of Systems  All other systems reviewed and are negative.    Physical Exam Updated Vital Signs BP (!) 177/101 (BP Location: Right Arm)   Pulse 100   Temp  98.4 F (36.9 C) (Oral)   Resp 18   SpO2 100%   Physical Exam  Musculoskeletal:  Wedding ring on left fourth digit minor surrounding swelling, no redness, no discoloration, cap refill intact sensation intact     ED Treatments / Results  Labs (all labs ordered are listed, but only abnormal results are displayed) Labs Reviewed - No data to display  EKG  EKG Interpretation None       Radiology No results found.  Procedures Procedures (including critical care time)  Patient verbally agreed to procedure understanding risks and benefits including damage to the underlying skin or inability to remove the ring.  Ring removal-using circular electric tool guide was placed under the ring, ring was cut and spread open and removed without complication.  No signs of damage to the underlying skin no complications.  Patient tolerated procedure  Medications Ordered in ED Medications - No data to display   Initial Impression / Assessment and Plan / ED Course  I have reviewed the triage vital signs and the nursing notes.  Pertinent labs & imaging results that were available during my care of the patient were reviewed by me and considered in my medical decision making (see chart for details).      Final Clinical Impressions(s) / ED Diagnoses   Final diagnoses:  Ring or other jewelry causing external constriction, initial encounter    47 year old female presents today with wedding ring stuck on her finger.  This was removed without  complication.  She has no skin tissue damages, full active range of motion cap refill intact.  Patient discharged home with strict return precautions.  Verbalized understanding and agreement to today's plan.  ED Discharge Orders    None       Francee Gentile 02/18/18 1512    Lajean Saver, MD 02/18/18 2141

## 2018-02-18 NOTE — Discharge Instructions (Signed)
Please monitor your finger.  If you develop any redness swelling or any complications return immediately.

## 2018-02-18 NOTE — ED Notes (Signed)
Patient left room stating she was going to tell her family member the outcome and never returned. Patient completed treatment prior to leaving but did not receive discharge papers.

## 2018-02-18 NOTE — ED Triage Notes (Signed)
Pt prsents to ED for assistance in removing ring on ring finger of the left hand after sleeping with it on and having difficulty removing it.  PAtient states she has tried ice, petroleum jelly, and soap to remove wihtout success.  Finger is warm, but swollen.

## 2018-02-23 ENCOUNTER — Telehealth (INDEPENDENT_AMBULATORY_CARE_PROVIDER_SITE_OTHER): Payer: Self-pay | Admitting: *Deleted

## 2018-02-23 NOTE — Telephone Encounter (Signed)
Patient verified DOB Patient is aware of cipro being sent to [facility] to address urine infection. Patient is aware of not using the glipizide while taking the antibiotic for 3 days. No further questions.

## 2018-02-23 NOTE — Telephone Encounter (Signed)
-----   Message from Loletta Specteroger David Gomez, PA-C sent at 02/16/2018  5:43 PM EST ----- Strep found in urine. I will send cipro since she has allergies to many other kinds of antibiotics. [facility] [address]. Also, please tell her to suspend glipizide while taking cipro (3 days).

## 2018-02-28 ENCOUNTER — Other Ambulatory Visit (INDEPENDENT_AMBULATORY_CARE_PROVIDER_SITE_OTHER): Payer: Self-pay | Admitting: Physician Assistant

## 2018-02-28 DIAGNOSIS — D649 Anemia, unspecified: Secondary | ICD-10-CM

## 2018-02-28 DIAGNOSIS — I10 Essential (primary) hypertension: Secondary | ICD-10-CM

## 2018-02-28 DIAGNOSIS — E119 Type 2 diabetes mellitus without complications: Secondary | ICD-10-CM

## 2018-03-03 NOTE — Telephone Encounter (Signed)
FWD to PCP. Reilly Blades S Maddex Garlitz, CMA  

## 2018-03-03 NOTE — Telephone Encounter (Signed)
FWD to PCP. Tempestt S Roberts, CMA  

## 2018-03-27 ENCOUNTER — Ambulatory Visit (INDEPENDENT_AMBULATORY_CARE_PROVIDER_SITE_OTHER): Payer: Medicare Other | Admitting: Physician Assistant

## 2018-03-30 ENCOUNTER — Other Ambulatory Visit (INDEPENDENT_AMBULATORY_CARE_PROVIDER_SITE_OTHER): Payer: Self-pay | Admitting: Physician Assistant

## 2018-03-30 DIAGNOSIS — D649 Anemia, unspecified: Secondary | ICD-10-CM

## 2018-03-30 NOTE — Telephone Encounter (Signed)
FWD to PCP. Tempestt S Roberts, CMA  

## 2018-04-13 ENCOUNTER — Emergency Department (HOSPITAL_COMMUNITY)
Admission: EM | Admit: 2018-04-13 | Discharge: 2018-04-13 | Discharge status: Against Medical Advice | Payer: Medicare Other

## 2018-04-13 NOTE — ED Notes (Signed)
Pt's name called for triage x2 no answer 

## 2018-04-13 NOTE — ED Notes (Signed)
Pt's name called for triage no answer 

## 2018-05-15 ENCOUNTER — Emergency Department (HOSPITAL_COMMUNITY): Payer: Medicare Other

## 2018-05-15 ENCOUNTER — Encounter (HOSPITAL_COMMUNITY): Payer: Self-pay

## 2018-05-15 ENCOUNTER — Observation Stay (HOSPITAL_COMMUNITY)
Admission: EM | Admit: 2018-05-15 | Discharge: 2018-05-16 | Discharge status: Against Medical Advice | Payer: Medicare Other | Attending: Internal Medicine | Admitting: Internal Medicine

## 2018-05-15 DIAGNOSIS — Z880 Allergy status to penicillin: Secondary | ICD-10-CM | POA: Diagnosis not present

## 2018-05-15 DIAGNOSIS — J45909 Unspecified asthma, uncomplicated: Secondary | ICD-10-CM | POA: Diagnosis not present

## 2018-05-15 DIAGNOSIS — K219 Gastro-esophageal reflux disease without esophagitis: Secondary | ICD-10-CM | POA: Diagnosis not present

## 2018-05-15 DIAGNOSIS — D509 Iron deficiency anemia, unspecified: Secondary | ICD-10-CM | POA: Insufficient documentation

## 2018-05-15 DIAGNOSIS — N179 Acute kidney failure, unspecified: Secondary | ICD-10-CM | POA: Insufficient documentation

## 2018-05-15 DIAGNOSIS — F419 Anxiety disorder, unspecified: Secondary | ICD-10-CM | POA: Insufficient documentation

## 2018-05-15 DIAGNOSIS — E1165 Type 2 diabetes mellitus with hyperglycemia: Secondary | ICD-10-CM | POA: Insufficient documentation

## 2018-05-15 DIAGNOSIS — E119 Type 2 diabetes mellitus without complications: Secondary | ICD-10-CM

## 2018-05-15 DIAGNOSIS — Z8719 Personal history of other diseases of the digestive system: Secondary | ICD-10-CM | POA: Diagnosis not present

## 2018-05-15 DIAGNOSIS — Z888 Allergy status to other drugs, medicaments and biological substances status: Secondary | ICD-10-CM | POA: Insufficient documentation

## 2018-05-15 DIAGNOSIS — Z881 Allergy status to other antibiotic agents status: Secondary | ICD-10-CM | POA: Insufficient documentation

## 2018-05-15 DIAGNOSIS — F17210 Nicotine dependence, cigarettes, uncomplicated: Secondary | ICD-10-CM | POA: Diagnosis not present

## 2018-05-15 DIAGNOSIS — G4733 Obstructive sleep apnea (adult) (pediatric): Secondary | ICD-10-CM | POA: Insufficient documentation

## 2018-05-15 DIAGNOSIS — Z91040 Latex allergy status: Secondary | ICD-10-CM | POA: Diagnosis not present

## 2018-05-15 DIAGNOSIS — R0789 Other chest pain: Secondary | ICD-10-CM

## 2018-05-15 DIAGNOSIS — Z823 Family history of stroke: Secondary | ICD-10-CM | POA: Insufficient documentation

## 2018-05-15 DIAGNOSIS — Z79899 Other long term (current) drug therapy: Secondary | ICD-10-CM | POA: Diagnosis not present

## 2018-05-15 DIAGNOSIS — G8929 Other chronic pain: Secondary | ICD-10-CM | POA: Insufficient documentation

## 2018-05-15 DIAGNOSIS — Z882 Allergy status to sulfonamides status: Secondary | ICD-10-CM | POA: Diagnosis not present

## 2018-05-15 DIAGNOSIS — Z791 Long term (current) use of non-steroidal anti-inflammatories (NSAID): Secondary | ICD-10-CM | POA: Insufficient documentation

## 2018-05-15 DIAGNOSIS — I10 Essential (primary) hypertension: Secondary | ICD-10-CM | POA: Insufficient documentation

## 2018-05-15 DIAGNOSIS — Z66 Do not resuscitate: Secondary | ICD-10-CM | POA: Diagnosis not present

## 2018-05-15 DIAGNOSIS — R079 Chest pain, unspecified: Principal | ICD-10-CM | POA: Diagnosis present

## 2018-05-15 DIAGNOSIS — F319 Bipolar disorder, unspecified: Secondary | ICD-10-CM | POA: Insufficient documentation

## 2018-05-15 DIAGNOSIS — Z794 Long term (current) use of insulin: Secondary | ICD-10-CM | POA: Diagnosis not present

## 2018-05-15 DIAGNOSIS — K449 Diaphragmatic hernia without obstruction or gangrene: Secondary | ICD-10-CM | POA: Diagnosis not present

## 2018-05-15 DIAGNOSIS — I161 Hypertensive emergency: Secondary | ICD-10-CM

## 2018-05-15 DIAGNOSIS — Z8249 Family history of ischemic heart disease and other diseases of the circulatory system: Secondary | ICD-10-CM | POA: Insufficient documentation

## 2018-05-15 LAB — BASIC METABOLIC PANEL
Anion gap: 9 (ref 5–15)
BUN: 11 mg/dL (ref 6–20)
CO2: 22 mmol/L (ref 22–32)
Calcium: 8.9 mg/dL (ref 8.9–10.3)
Chloride: 106 mmol/L (ref 101–111)
Creatinine, Ser: 1.26 mg/dL — ABNORMAL HIGH (ref 0.44–1.00)
GFR calc Af Amer: 59 mL/min — ABNORMAL LOW (ref >60)
GFR calc non Af Amer: 51 mL/min — ABNORMAL LOW (ref >60)
Glucose, Bld: 527 mg/dL (ref 65–99)
Potassium: 3.9 mmol/L (ref 3.5–5.1)
Sodium: 137 mmol/L (ref 135–145)

## 2018-05-15 LAB — CBC
HCT: 35.7 % — ABNORMAL LOW (ref 36.0–46.0)
Hemoglobin: 11 g/dL — ABNORMAL LOW (ref 12.0–15.0)
MCH: 23.1 pg — ABNORMAL LOW (ref 26.0–34.0)
MCHC: 30.8 g/dL (ref 30.0–36.0)
MCV: 75 fL — ABNORMAL LOW (ref 78.0–100.0)
Platelets: 245 10*3/uL (ref 150–400)
RBC: 4.76 MIL/uL (ref 3.87–5.11)
RDW: 19.9 % — ABNORMAL HIGH (ref 11.5–15.5)
WBC: 10.3 10*3/uL (ref 4.0–10.5)

## 2018-05-15 LAB — CBG MONITORING, ED: Glucose-Capillary: 447 mg/dL — ABNORMAL HIGH (ref 65–99)

## 2018-05-15 LAB — I-STAT TROPONIN, ED: Troponin i, poc: 0 ng/mL (ref 0.00–0.08)

## 2018-05-15 LAB — I-STAT BETA HCG BLOOD, ED (MC, WL, AP ONLY): I-stat hCG, quantitative: <5 m[IU]/mL (ref <5)

## 2018-05-15 MED ORDER — ASPIRIN 81 MG PO CHEW: 324.0000 mg | CHEWABLE_TABLET | Freq: Once | ORAL | Status: DC

## 2018-05-15 NOTE — ED Provider Notes (Signed)
Patient placed in Quick Look pathway, seen and evaluated   Chief Complaint: chest pain  HPI: 47 y.o. female with a past medical history of HTN and DM who presents emergency department today for chest pain.  Patient reports she has been having substernal chest pain with radiation to her left neck and left shoulder with associated shortness of breath and nausea over the last 1 week has been intermittent.  She notes that her symptoms are worse with exertion.  Her symptoms typically lasts greater than 15 minutes.  She has not taken anything for symptoms.  She was seen by a new PCP today but did not EKG and told her she may have had a heart attack in the past.  She states she has had pericarditis in the past (approximately 4 years ago) but denies any MI, CVA or PAD.  Last stress test and echocardiogram in 2016.  Patient does have positive family history for heart disease including patient's mother.  Patient is an everyday smoker.   ROS:  Positive ROS: (+) chest pain, shortness of breath, nausea.  Negative ROS: (-) fever, cough, hemoptysis, burping, belching   Physical Exam:   Gen: No distress  Neuro: Awake and Alert  Skin: Warm  Focused Exam: Heart RRR, nml S1,S2, no m/r/g; Lungs CTAB; Abd soft, NT, no rebound or guarding; Ext 2+ pedal pulses bilaterally, no edema.  BP (!) 182/100 (BP Location: Left Arm)   Pulse 94   Temp 99.1 F (37.3 C) (Oral)   Resp 16   SpO2 99%   Plan: Based on initial evaluation, labs ARE indicated and radiology studies ARE indicated.  Patient counseled on process, plan, and necessity for staying for completing the evaluation."  Initiation of care has begun. The patient has been counseled on the process, plan, and necessity for staying for the completion/evaluation, and the remainder of the medical screening examination    Princella Pellegrini 05/15/18 1646    Doug Sou, MD 05/16/18 [phone]

## 2018-05-15 NOTE — ED Notes (Signed)
Lab called this RN with critical glucose level of 557

## 2018-05-15 NOTE — ED Triage Notes (Signed)
Pt presents to ED with central CP that radiates to left arm, neck, and back x several weeks, but states pain became worse today. PT states she saw pcp today who told her it looked like she may have had a heart attack.

## 2018-05-16 ENCOUNTER — Encounter (HOSPITAL_COMMUNITY): Payer: Self-pay | Admitting: Cardiology

## 2018-05-16 ENCOUNTER — Observation Stay (HOSPITAL_BASED_OUTPATIENT_CLINIC_OR_DEPARTMENT_OTHER): Payer: Medicare Other

## 2018-05-16 DIAGNOSIS — I200 Unstable angina: Secondary | ICD-10-CM

## 2018-05-16 DIAGNOSIS — Z720 Tobacco use: Secondary | ICD-10-CM | POA: Diagnosis not present

## 2018-05-16 DIAGNOSIS — I10 Essential (primary) hypertension: Secondary | ICD-10-CM

## 2018-05-16 DIAGNOSIS — I5030 Unspecified diastolic (congestive) heart failure: Secondary | ICD-10-CM | POA: Diagnosis not present

## 2018-05-16 DIAGNOSIS — R079 Chest pain, unspecified: Secondary | ICD-10-CM | POA: Diagnosis not present

## 2018-05-16 DIAGNOSIS — I161 Hypertensive emergency: Secondary | ICD-10-CM

## 2018-05-16 LAB — CBG MONITORING, ED
Glucose-Capillary: 371 mg/dL — ABNORMAL HIGH (ref 65–99)
Glucose-Capillary: 331 mg/dL — ABNORMAL HIGH (ref 65–99)
Glucose-Capillary: 349 mg/dL — ABNORMAL HIGH (ref 65–99)

## 2018-05-16 LAB — BASIC METABOLIC PANEL
Anion gap: 10 (ref 5–15)
BUN: 8 mg/dL (ref 6–20)
CO2: 21 mmol/L — ABNORMAL LOW (ref 22–32)
Calcium: 8.7 mg/dL — ABNORMAL LOW (ref 8.9–10.3)
Chloride: 103 mmol/L (ref 101–111)
Creatinine, Ser: 0.78 mg/dL (ref 0.44–1.00)
GFR calc Af Amer: >60 mL/min (ref >60)
GFR calc non Af Amer: >60 mL/min (ref >60)
Glucose, Bld: 336 mg/dL — ABNORMAL HIGH (ref 65–99)
Potassium: 3.4 mmol/L — ABNORMAL LOW (ref 3.5–5.1)
Sodium: 134 mmol/L — ABNORMAL LOW (ref 135–145)

## 2018-05-16 LAB — TROPONIN I
Troponin I: <0.03 ng/mL (ref <0.03)
Troponin I: <0.03 ng/mL (ref <0.03)

## 2018-05-16 LAB — CBC
HCT: 37.3 % (ref 36.0–46.0)
Hemoglobin: 11.5 g/dL — ABNORMAL LOW (ref 12.0–15.0)
MCH: 23 pg — ABNORMAL LOW (ref 26.0–34.0)
MCHC: 30.8 g/dL (ref 30.0–36.0)
MCV: 74.6 fL — ABNORMAL LOW (ref 78.0–100.0)
Platelets: 259 10*3/uL (ref 150–400)
RBC: 5 MIL/uL (ref 3.87–5.11)
RDW: 19.9 % — ABNORMAL HIGH (ref 11.5–15.5)
WBC: 8.7 10*3/uL (ref 4.0–10.5)

## 2018-05-16 LAB — HEMOGLOBIN A1C
Hgb A1c MFr Bld: 12.3 % — ABNORMAL HIGH (ref 4.8–5.6)
Mean Plasma Glucose: 306.31 mg/dL

## 2018-05-16 LAB — HCG, SERUM, QUALITATIVE: Preg, Serum: NEGATIVE

## 2018-05-16 LAB — ECHOCARDIOGRAM COMPLETE

## 2018-05-16 MED ORDER — ACETAMINOPHEN 325 MG PO TABS: 650.0000 mg | ORAL_TABLET | Freq: Four times a day (QID) | ORAL | Status: DC | PRN

## 2018-05-16 MED ORDER — IRBESARTAN 300 MG PO TABS: 300.0000 mg | ORAL_TABLET | Freq: Every day | ORAL | Status: DC

## 2018-05-16 MED ORDER — SODIUM CHLORIDE 0.9% FLUSH: 3.0000 mL | Freq: Two times a day (BID) | INTRAVENOUS | Status: DC

## 2018-05-16 MED ORDER — NITROGLYCERIN 2 % TD OINT
1.0000 [in_us] | TOPICAL_OINTMENT | Freq: Once | TRANSDERMAL | Status: AC
  Administered 2018-05-16: 1 [in_us] via TOPICAL
  Filled 2018-05-16: qty 1

## 2018-05-16 MED ORDER — HYDROCHLOROTHIAZIDE 25 MG PO TABS
12.5000 mg | ORAL_TABLET | Freq: Every day | ORAL | Status: DC
  Administered 2018-05-16: 12.5 mg via ORAL
  Filled 2018-05-16: qty 1

## 2018-05-16 MED ORDER — TOPIRAMATE 25 MG PO TABS: 100.0000 mg | ORAL_TABLET | Freq: Every day | ORAL | Status: DC

## 2018-05-16 MED ORDER — LURASIDONE HCL 80 MG PO TABS: 80.0000 mg | ORAL_TABLET | Freq: Every evening | ORAL | Status: DC

## 2018-05-16 MED ORDER — ACETAMINOPHEN 650 MG RE SUPP: 650.0000 mg | Freq: Four times a day (QID) | RECTAL | Status: DC | PRN

## 2018-05-16 MED ORDER — HYDROCHLOROTHIAZIDE 12.5 MG PO CAPS
12.5000 mg | ORAL_CAPSULE | Freq: Once | ORAL | Status: AC
Start: 2018-05-16 — End: 2018-05-16
  Administered 2018-05-16: 12.5 mg via ORAL

## 2018-05-16 MED ORDER — ASPIRIN 81 MG PO CHEW
324.0000 mg | CHEWABLE_TABLET | Freq: Once | ORAL | Status: AC
  Administered 2018-05-16: 324 mg via ORAL
  Filled 2018-05-16: qty 4

## 2018-05-16 MED ORDER — HYDROCHLOROTHIAZIDE 25 MG PO TABS
25.0000 mg | ORAL_TABLET | Freq: Every day | ORAL | Status: DC
  Filled 2018-05-16: qty 1

## 2018-05-16 MED ORDER — ENOXAPARIN SODIUM 40 MG/0.4ML ~~LOC~~ SOLN
40.0000 mg | SUBCUTANEOUS | Status: DC
  Administered 2018-05-16: 40 mg via SUBCUTANEOUS
  Filled 2018-05-16 (×2): qty 0.4

## 2018-05-16 MED ORDER — POLYETHYLENE GLYCOL 3350 17 G PO PACK: 17.0000 g | PACK | Freq: Every day | ORAL | Status: DC | PRN

## 2018-05-16 MED ORDER — LOSARTAN POTASSIUM 50 MG PO TABS
100.0000 mg | ORAL_TABLET | Freq: Every day | ORAL | Status: DC
  Administered 2018-05-16: 100 mg via ORAL
  Filled 2018-05-16 (×2): qty 2

## 2018-05-16 MED ORDER — ASPIRIN EC 81 MG PO TBEC: 81.0000 mg | DELAYED_RELEASE_TABLET | Freq: Every day | ORAL | Status: DC

## 2018-05-16 MED ORDER — AMLODIPINE BESYLATE 5 MG PO TABS
10.0000 mg | ORAL_TABLET | Freq: Every day | ORAL | Status: DC
  Administered 2018-05-16: 10 mg via ORAL
  Filled 2018-05-16: qty 2

## 2018-05-16 MED ORDER — BUSPIRONE HCL 10 MG PO TABS
10.0000 mg | ORAL_TABLET | Freq: Two times a day (BID) | ORAL | Status: DC
  Administered 2018-05-16: 10 mg via ORAL
  Filled 2018-05-16: qty 1

## 2018-05-16 MED ORDER — RAMELTEON 8 MG PO TABS: 8.0000 mg | ORAL_TABLET | Freq: Every evening | ORAL | Status: DC | PRN

## 2018-05-16 MED ORDER — AMITRIPTYLINE HCL 25 MG PO TABS: 100.0000 mg | ORAL_TABLET | Freq: Every day | ORAL | Status: DC

## 2018-05-16 MED ORDER — INSULIN ASPART 100 UNIT/ML ~~LOC~~ SOLN: 0.0000 [IU] | Freq: Every day | SUBCUTANEOUS | Status: DC

## 2018-05-16 MED ORDER — TRAZODONE HCL 50 MG PO TABS: 50.0000 mg | ORAL_TABLET | Freq: Every day | ORAL | Status: DC

## 2018-05-16 MED ORDER — INSULIN ASPART 100 UNIT/ML ~~LOC~~ SOLN
0.0000 [IU] | Freq: Three times a day (TID) | SUBCUTANEOUS | Status: DC
  Administered 2018-05-16: 15 [IU] via SUBCUTANEOUS
  Filled 2018-05-16: qty 1

## 2018-05-16 NOTE — ED Notes (Signed)
Lunch ordered; heart healthy/carb mod diet

## 2018-05-16 NOTE — ED Provider Notes (Signed)
[facility] EMERGENCY DEPARTMENT Provider Note   CSN: [id number] Arrival date & time: 05/15/18  1627     History   Chief Complaint Chief Complaint  Patient presents with  . Shortness of Breath  . Chest Pain    HPI Stephanie Thornton is a 47 y.o. female.  47 year old female with history of diabetes and hypertension and tobacco abuse presents with intermittent chest pain lasting for several minutes x3 weeks.  Symptoms have become exertion better with rest at this time.  There is been some associated diaphoresis and dyspnea as well as nausea vomiting.  Patient's mother died of a heart attack at age of 32.  Pain is worse sometimes with movement of her arm.  Because of increasing symptoms patient presents at this time.  No prior history of coronary artery disease.     Past Medical History:  Diagnosis Date  . Anxiety   . Arthritis    "knees, feet, back" (05/24/2015)  . Asthma   . Benign essential HTN 05/01/2015  . Bipolar disorder ([place]) diagnosed early 70s  . Chronic back pain   . Chronic bronchitis ([place])    "get it q yr"  . Chronic lower back pain   . Depression   . Diabetes mellitus type II 2010  . GERD (gastroesophageal reflux disease)   . Headache    "maybe 3 times/wk" (05/24/2015)  . High blood pressure   . History of hiatal hernia   . History of stomach ulcers   . Menopause   . Migraine    "1-2/wk" (05/24/2015)  . OSA on CPAP     Patient Active Problem List   Diagnosis Date Noted  . Depression   . Diabetes type 2, controlled ([place])   . Chronic tonsillitis 05/24/2015  . Uncontrolled hypertension 05/24/2015  . Benign essential HTN 05/01/2015  . Abnormal EKG 05/01/2015  . Preoperative clearance 05/01/2015  . Hx of peritonsillar abscess drainage 01/03/2015  . DM (diabetes mellitus) ([place]) 09/10/2013  . Hyperosmolar non-ketotic state in patient with type 2 diabetes mellitus ([place]) 09/10/2013  . Nausea & vomiting 09/10/2013  . Chest pain 09/10/2013    . Bipolar disorder ([place])   . Bipolar I disorder, most recent episode (or current) depressed, severe, without mention of psychotic behavior 11/26/2011    Past Surgical History:  Procedure Laterality Date  . ABDOMINAL HYSTERECTOMY  2007  . BUNIONECTOMY Bilateral   . CARPAL TUNNEL RELEASE Right   . HAMMER TOE SURGERY Left   . TONSILLECTOMY Bilateral 05/24/2015  . TONSILLECTOMY Bilateral 05/24/2015   Procedure: TONSILLECTOMY;  Surgeon: Melida Quitter, MD;  Location: [facility];  Service: ENT;  Laterality: Bilateral;     OB History   None      Home Medications    Prior to Admission medications   Medication Sig Start Date End Date Taking? Authorizing Provider  albuterol (PROVENTIL HFA;VENTOLIN HFA) 108 (90 Base) MCG/ACT inhaler Inhale 2 puffs into the lungs every 4 (four) hours as needed for wheezing or shortness of breath (wheezing). 12/16/17   Clent Demark, PA-C  amLODipine (NORVASC) 10 MG tablet TAKE 1 TABLET BY MOUTH EVERY DAY 01/20/18   Clent Demark, PA-C  blood glucose meter kit and supplies KIT Dispense based on patient and insurance preference. Use up to four times daily as directed. (FOR ICD-9 250.00, 250.01). 02/13/18   Clent Demark, PA-C  clonazePAM (KLONOPIN) 1 MG tablet Take 1 tablet (1 mg total) by mouth 2 (two) times daily as needed  for anxiety. 12/16/17   Clent Demark, PA-C  FEROSUL 325 (65 Fe) MG tablet TAKE 1 TABLET BY MOUTH THREE TIMES DAILY WITH FOOD 01/20/18   Clent Demark, PA-C  FEROSUL 325 (65 Fe) MG tablet TAKE 1 TABLET BY MOUTH THREE TIMES DAILY WITH FOOD 03/09/18   Clent Demark, PA-C  FLUoxetine (PROZAC) 10 MG capsule Take 10 mg by mouth every morning.     [provider]  glipiZIDE (GLUCOTROL) 10 MG tablet TAKE 1 TABLET BY MOUTH TWICE DAILY WITH FOOD 03/09/18   Clent Demark, PA-C  hydrochlorothiazide (HYDRODIURIL) 12.5 MG tablet Take 1 tablet (12.5 mg total) by mouth daily. 02/13/18   Clent Demark, PA-C  hydrOXYzine  (ATARAX/VISTARIL) 25 MG tablet Take 1 tablet (25 mg total) by mouth 2 (two) times daily. 02/13/18   Clent Demark, PA-C  insulin aspart (NOVOLOG) 100 UNIT/ML injection Inject 5 Units into the skin 3 (three) times daily with meals. 12/16/17   Clent Demark, PA-C  insulin glargine (LANTUS) 100 UNIT/ML injection Use 40 units at bedtime and 40 units 12 hours after bedtime dose. 02/13/18   Clent Demark, PA-C  INSULIN SYRINGE 1CC/29G 29G X 1/2" 1 ML MISC 1 each by Does not apply route 5 (five) times daily. 12/16/17   Clent Demark, PA-C  losartan (COZAAR) 100 MG tablet Take 1 tablet (100 mg total) by mouth daily. 12/16/17   Clent Demark, PA-C  lovastatin (MEVACOR) 40 MG tablet Take 1 tablet (40 mg total) by mouth at bedtime. 12/19/17   Clent Demark, PA-C  meloxicam (MOBIC) 15 MG tablet Take 1 tablet (15 mg total) by mouth daily. 06/28/14   Hyatt, Max T, DPM  omeprazole (PRILOSEC) 40 MG capsule TAKE ONE CAPSULE BY MOUTH TWICE DAILY 03/09/18   Clent Demark, PA-C  Oxycodone HCl 20 MG TABS Take 1 tablet by mouth 5 (five) times daily as needed (pain.).    [provider]  propranolol (INDERAL) 40 MG tablet TAKE 1 TABLET BY MOUTH THREE TIMES DAILY 03/09/18   Clent Demark, PA-C  QUEtiapine (SEROQUEL XR) 300 MG 24 hr tablet Take 600 mg by mouth at bedtime.     [provider]  sitaGLIPtin-metformin (JANUMET) 50-1000 MG tablet Take 1 tablet by mouth 2 (two) times daily with a meal. 12/16/17   Clent Demark, PA-C    Family History Family History  Problem Relation Age of Onset  . Bipolar disorder Mother   . CVA Mother   . Bipolar disorder Son   . Depression Brother        schizoaffective d/o  . Hypertension Sister   . Heart disease Maternal Aunt   . Heart attack Maternal Aunt   . Hypertension Brother     Social History Social History   Tobacco Use  . Smoking status: Current Some Day Smoker    Packs/day: 1.00    Years: 30.00    Pack  years: 30.00    Types: Cigarettes  . Smokeless tobacco: Never Used  Substance Use Topics  . Alcohol use: No  . Drug use: No     Allergies   Lyrica [pregabalin]; Rocephin [ceftriaxone sodium in dextrose]; Tramadol; Keflex [cephalexin]; Penicillins; Sulfa antibiotics; and Latex   Review of Systems Review of Systems  All other systems reviewed and are negative.    Physical Exam Updated Vital Signs BP (!) 199/118 (BP Location: Right Arm)   Pulse 87   Temp 98.6 F (37 C) (  Oral)   Resp 16   SpO2 100%   Physical Exam  Constitutional: She is oriented to person, place, and time. She appears well-developed and well-nourished.  Non-toxic appearance. No distress.  HENT:  Head: Normocephalic and atraumatic.  Eyes: Pupils are equal, round, and reactive to light. Conjunctivae, EOM and lids are normal.  Neck: Normal range of motion. Neck supple. No tracheal deviation present. No thyroid mass present.    Cardiovascular: Normal rate, regular rhythm and normal heart sounds. Exam reveals no gallop.  No murmur heard. Pulmonary/Chest: Effort normal and breath sounds normal. No stridor. No respiratory distress. She has no decreased breath sounds. She has no wheezes. She has no rhonchi. She has no rales.  Abdominal: Soft. Normal appearance and bowel sounds are normal. She exhibits no distension. There is no tenderness. There is no rebound and no CVA tenderness.  Musculoskeletal: Normal range of motion. She exhibits no edema or tenderness.  Neurological: She is alert and oriented to person, place, and time. She has normal strength. No cranial nerve deficit or sensory deficit. GCS eye subscore is 4. GCS verbal subscore is 5. GCS motor subscore is 6.  Skin: Skin is warm and dry. No abrasion and no rash noted.  Psychiatric: She has a normal mood and affect. Her speech is normal and behavior is normal.  Nursing note and vitals reviewed.    ED Treatments / Results  Labs (all labs ordered are  listed, but only abnormal results are displayed) Labs Reviewed  BASIC METABOLIC PANEL - Abnormal; Notable for the following components:      Result Value   Glucose, Bld 527 (*)    Creatinine, Ser 1.26 (*)    GFR calc non Af Amer 51 (*)    GFR calc Af Amer 59 (*)    All other components within normal limits  CBC - Abnormal; Notable for the following components:   Hemoglobin 11.0 (*)    HCT 35.7 (*)    MCV 75.0 (*)    MCH 23.1 (*)    RDW 19.9 (*)    All other components within normal limits  CBG MONITORING, ED - Abnormal; Notable for the following components:   Glucose-Capillary 447 (*)    All other components within normal limits  CBG MONITORING, ED - Abnormal; Notable for the following components:   Glucose-Capillary 371 (*)    All other components within normal limits  I-STAT TROPONIN, ED  I-STAT BETA HCG BLOOD, ED ([facility], [facility], [facility] ONLY)    EKG EKG Interpretation  Date/Time:  Friday May 15 2018 16:47:33 EDT Ventricular Rate:  92 PR Interval:  160 QRS Duration: 80 QT Interval:  364 QTC Calculation: 450 R Axis:   79 Text Interpretation:  Normal sinus rhythm Normal ECG Anteriolateral T wave changes seen in 2016 are now resolved Confirmed by Ward, Cyril Mourning [phone]) on 05/16/2018 7:02:56 AM Also confirmed by Lacretia Leigh ([id number])  on 05/16/2018 7:18:49 AM   Radiology Dg Chest 2 View  Result Date: 05/15/2018 CLINICAL DATA:  Pt presents to ED with SOB and central CP that radiates to left arm, neck, and back x several weeks, but states pain became worse today. PT states she saw pcp today who told her it looked like she may have had a heart attack. Pt .*comment was truncated* EXAM: CHEST - 2 VIEW COMPARISON:  None. FINDINGS: Normal mediastinum and cardiac silhouette. Mild prominence of the pulmonary vasculature. No evidence of effusion, infiltrate, or pneumothorax. No acute bony abnormality. IMPRESSION: No change  from comparison exam.  No acute cardiopulmonary process. Electronically Signed    By: Suzy Bouchard M.D.   On: 05/15/2018 18:15    Procedures Procedures (including critical care time)  Medications Ordered in ED Medications  aspirin chewable tablet 324 mg (has no administration in time range)  aspirin chewable tablet 324 mg (has no administration in time range)  nitroGLYCERIN (NITROGLYN) 2 % ointment 1 inch (has no administration in time range)     Initial Impression / Assessment and Plan / ED Course  I have reviewed the triage vital signs and the nursing notes.  Pertinent labs & imaging results that were available during my care of the patient were reviewed by me and considered in my medical decision making (see chart for details).     She has a heart score of 5.  Patient given aspirin and nitroglycerin paste for hypertension.  Will consult hospitalist for admission  Final Clinical Impressions(s) / ED Diagnoses   Final diagnoses:  Chest pain, unspecified type    ED Discharge Orders    None       Lacretia Leigh, MD 05/16/18 [phone]

## 2018-05-16 NOTE — ED Triage Notes (Signed)
PT reports she wants to leave for home.

## 2018-05-16 NOTE — Consult Note (Signed)
Cardiology Consultation:   Patient ID: Stephanie Thornton; [id number]; 25-Jan-1971   Admit date: 05/15/2018 Date of Consult: 05/16/2018  Primary Care Provider: Loletta Specter, PA-C Primary Cardiologist: Dr Mayford Knife    Patient Profile:   Stephanie Thornton is a 47 y.o. female with a hx of hypertension, diabetes mellitus, bipolar disorder who is being seen today for the evaluation of chest pain at the request of Rozann Lesches MD.  History of Present Illness:   Patient had a nuclear study in May 2016 ordered by Dr. Mayford Knife for chest pain and preoperative evaluation. This showed normal perfusion and LV function.  Echocardiogram May 2016 showed vigorous LV function and grade 1 diastolic dysfunction.  Patient has had chest pain in the past.  However over the past 2 weeks she notices substernal chest tightness with exertion relieved with rest.  The pain radiates to her neck and left upper extremity.  She has dyspnea with these activities as well.  She does not have the symptoms at rest although she states she occasionally feels like "it may start".  She denies orthopnea, PND, pedal edema, syncope.  She was admitted and cardiology asked to evaluate.  Past Medical History:  Diagnosis Date  . Anxiety   . Arthritis    "knees, feet, back" (05/24/2015)  . Asthma   . Benign essential HTN 05/01/2015  . Bipolar disorder ([facility]) diagnosed early 37s  . Chronic back pain   . Chronic bronchitis ([facility])    "get it q yr"  . Chronic lower back pain   . Depression   . Diabetes mellitus type II 2010  . GERD (gastroesophageal reflux disease)   . Headache    "maybe 3 times/wk" (05/24/2015)  . High blood pressure   . History of hiatal hernia   . History of stomach ulcers   . Menopause   . Migraine    "1-2/wk" (05/24/2015)  . OSA on CPAP     Past Surgical History:  Procedure Laterality Date  . ABDOMINAL HYSTERECTOMY  2007  . BUNIONECTOMY Bilateral   . CARPAL TUNNEL RELEASE Right   . HAMMER TOE SURGERY Left    . TONSILLECTOMY Bilateral 05/24/2015  . TONSILLECTOMY Bilateral 05/24/2015   Procedure: TONSILLECTOMY;  Surgeon: Christia Reading, MD;  [facility];  Service: ENT;  Laterality: Bilateral;     Inpatient Medications: Scheduled Meds: . amLODipine  10 mg Oral Daily  . aspirin  324 mg Oral Once  . aspirin EC  81 mg Oral Daily  . enoxaparin (LOVENOX) injection  40 mg Subcutaneous Q24H  . hydrochlorothiazide  12.5 mg Oral Daily  . insulin aspart  0-20 Units Subcutaneous TID WC  . insulin aspart  0-5 Units Subcutaneous QHS  . losartan  100 mg Oral Daily  . sodium chloride flush  3 mL Intravenous Q12H   Continuous Infusions:  PRN Meds: acetaminophen **OR** acetaminophen, polyethylene glycol  Allergies:    Allergies  Allergen Reactions  . Lyrica [Pregabalin] Shortness Of Breath and Rash  . Rocephin [Ceftriaxone Sodium In Dextrose] Shortness Of Breath  . Tramadol Shortness Of Breath    Had asthma attack  . Vancomycin Anaphylaxis    Per Dr. Flonnie Overman  . Keflex [Cephalexin] Hives  . Penicillins Hives    Has patient had a PCN reaction causing immediate rash, facial/tongue/throat swelling, SOB or lightheadedness with hypotension: YES Has patient had a PCN reaction causing severe rash involving mucus membranes or skin necrosis: NO Has patient had a PCN reaction that required hospitalization: YES  Has patient had a PCN reaction occurring within the last 10 years: YES If all of the above answers are "NO", then may proceed with Cephalosporin use.  . Sulfa Antibiotics Hives  . Latex Rash    Generally with clear tape, bandaides    Social History:   Social History   Socioeconomic History  . Marital status: Divorced    Spouse name: Not on file  . Number of children: Not on file  . Years of education: Not on file  . Highest education level: Not on file  Occupational History  . Not on file  Social Needs  . Financial resource strain: Not on file  . Food insecurity:    Worry: Not on file     Inability: Not on file  . Transportation needs:    Medical: Not on file    Non-medical: Not on file  Tobacco Use  . Smoking status: Current Some Day Smoker    Packs/day: 1.00    Years: 30.00    Pack years: 30.00    Types: Cigarettes  . Smokeless tobacco: Never Used  Substance and Sexual Activity  . Alcohol use: No  . Drug use: No  . Sexual activity: Yes  Lifestyle  . Physical activity:    Days per week: Not on file    Minutes per session: Not on file  . Stress: Not on file  Relationships  . Social connections:    Talks on phone: Not on file    Gets together: Not on file    Attends religious service: Not on file    Active member of club or organization: Not on file    Attends meetings of clubs or organizations: Not on file    Relationship status: Not on file  . Intimate partner violence:    Fear of current or ex partner: Not on file    Emotionally abused: Not on file    Physically abused: Not on file    Forced sexual activity: Not on file  Other Topics Concern  . Not on file  Social History Narrative  . Not on file    Family History:    Family History  Problem Relation Age of Onset  . Bipolar disorder Mother   . CVA Mother   . Bipolar disorder Son   . Depression Brother        schizoaffective d/o  . Hypertension Sister   . Heart disease Maternal Aunt   . Heart attack Maternal Aunt   . Hypertension Brother      ROS:  Please see the history of present illness.  Patient denies fevers, chills, productive cough or hemoptysis. All other ROS reviewed and negative.     Physical Exam/Data:   Vitals:   05/16/18 0900 05/16/18 0945 05/16/18 1000 05/16/18 1015  BP: (!) 171/109 (!) 187/114 (!) 183/113 (!) 172/114  Pulse: 85 90 92 88  Resp: Temp:      TempSrc:      SpO2: 100% 99% 100% 100%   No intake or output data in the 24 hours ending 05/16/18 1205 There were no vitals filed for this visit. There is no height or weight on file to calculate BMI.    General:  Obese, well developed, in no acute distress HEENT: normal Lymph: no adenopathy Neck: no JVD Endocrine:  No thryomegaly Vascular: No carotid bruits; FA pulses 2+ bilaterally without bruits  Cardiac:  normal S1, S2; RRR; no murmur Lungs:  clear to auscultation  bilaterally, no wheezing, rhonchi or rales  Abd: soft, nontender, no hepatomegaly  Ext: no edema Musculoskeletal:  No deformities, BUE and BLE strength normal and equal Skin: warm and dry  Neuro:  CNs 2-12 intact, no focal abnormalities noted Psych:  Normal affect   EKG:  The EKG was personally reviewed and demonstrates:  Sinus rhythm with no ST changes.  Laboratory Data:  Chemistry Recent Labs  Lab 05/15/18 1628 05/16/18 0917  NA 137 134*  K 3.9 3.4*  CL 106 103  CO2 22 21*  GLUCOSE 527* 336*  BUN 11 8  CREATININE 1.26* 0.78  CALCIUM 8.9 8.7*  GFRNONAA 51* >60  GFRAA 59* >60  ANIONGAP 9 10    Hematology Recent Labs  Lab 05/15/18 1628 05/16/18 0917  WBC 10.3 8.7  RBC 4.76 5.00  HGB 11.0* 11.5*  HCT 35.7* 37.3  MCV 75.0* 74.6*  MCH 23.1* 23.0*  MCHC 30.8 30.8  RDW 19.9* 19.9*  PLT 245 259   Cardiac Enzymes Recent Labs  Lab 05/16/18 0917  TROPONINI <0.03    Recent Labs  Lab 05/15/18 1657  TROPIPOC 0.00    Radiology/Studies:  Dg Chest 2 View  Result Date: 05/15/2018 CLINICAL DATA:  Pt presents to ED with SOB and central CP that radiates to left arm, neck, and back x several weeks, but states pain became worse today. PT states she saw pcp today who told her it looked like she may have had a heart attack. Pt .*comment was truncated* EXAM: CHEST - 2 VIEW COMPARISON:  None. FINDINGS: Normal mediastinum and cardiac silhouette. Mild prominence of the pulmonary vasculature. No evidence of effusion, infiltrate, or pneumothorax. No acute bony abnormality. IMPRESSION: No change from comparison exam.  No acute cardiopulmonary process. Electronically Signed   By: Genevive Bi M.D.   On: 05/15/2018  18:15    Assessment and Plan:   1. Unstable angina-patient presents with symptoms that are concerning for unstable angina.  She has multiple risk factors including tobacco abuse, uncontrolled hypertension, uncontrolled diabetes mellitus and hyperlipidemia.  She has a family history of CVA.  Will treat with aspirin; add IV heparin.  Add carvedilol 6.25 mg twice daily both for blood pressure and as antianginal.  Add statin.  I discussed options with patient including nuclear study versus cardiac CTA versus catheterization.  I feel definitive evaluation is warranted.  Plan cardiac catheterization.  The risks and benefits including myocardial infarction, CVA and death discussed and she agrees to proceed.  Plan Monday. 2. Hypertension-blood pressure is significantly elevated.  I will add carvedilol 6.25 mg twice daily.  We can increase as needed for blood pressure control.  Continue ARB and low-dose diuretic. 3. Diabetes mellitus-uncontrolled.  Will need further adjustments per primary care. 4. Tobacco abuse-patient counseled on discontinuing. 5. No CODE BLUE status-patient states that she does not want resuscitation in the event of an accident.  She is agreeable to resuscitation at time of catheterization.  Bipolar disorder-management per primary care. 6. Bipolar disorder-management per primary care. 7. Microcytic anemia-likely secondary to menstruation.   For questions or updates, please contact [facility] Please consult [email or web address] for contact info under Cardiology/STEMI.   Signed, Olga Millers, MD  05/16/2018 12:05 PM

## 2018-05-16 NOTE — Progress Notes (Signed)
  Echocardiogram 2D Echocardiogram has been performed.  Morena Mckissack L Androw 05/16/2018, 12:43 PM

## 2018-05-16 NOTE — ED Notes (Signed)
High bp let pod a nurse know

## 2018-05-16 NOTE — Progress Notes (Addendum)
Spoke with patient regarding desire to leave ED for home. Patient has custody of young toddler, age 46, who is currently staying with friends at home. Per patient she will not have child care after 9 pm tonight. Patient has called other friends in the area and states that she has no other options for child care and will, unfortunately, have to leave the hospital prior to completing her workup. Patient understands risks of leaving the hospital against medical advice. She expressed understanding that we are most concerned for ACS, given that her symptoms are typical of unstable angina, and understands implications of leaving prior to completing planned workup.  Spoke to Dr. Jens Som who agrees that patient's presentation is concerning for unstable angina and would like to continue inpatient workup with anticoagulation followed by cardiac catheterization. He kindly agreed to follow with patient as outpatient if she leaves AMA today so that her workup can be continued given her high risk for CAD. His group's information will be provided to the patient prior to leaving so she can follow up as an outpatient.   [facility] at [facility] [address], [address] [place], [place] [address]  Main Telephone: [phone]

## 2018-05-16 NOTE — H&P (Addendum)
Date: 05/16/2018               Patient Name:  Stephanie Thornton MRN: [id number]  DOB: June 01, 1971 Age / Sex: 47 y.o., female   PCP: Loletta Specter, PA-C         Medical Service: Internal Medicine Teaching Service         Attending Physician: Dr. Earl Lagos, MD    First Contact: Dr. Landis Martins Pager: [phone]  Second Contact: Dr. Arnetha Courser Pager: [phone]       After Hours (After 5p/  First Contact Pager: [phone]  weekends / holidays): Second Contact Pager: [phone]   Chief Complaint: Chest pain  History of Present Illness:  Stephanie Thornton is a 47 yo with PMH of HTN, insulin dependent T2DM, bipolar disorder, and chronic pain who presents for evaluation of ongoing, intermittent chest pain for the past 2 weeks. History obtained directly from the patient. In the weeks leading up to admission the patient notes onset of dull, substernal chest pain rated as a 5/10. The pain is associated with diaphoresis and radiates to her neck, left upper extremity. Patient notes that pain worsens with exertion (7-10/10 with exertion) and is relieved within 1-2 minutes after sitting down. Patient states that the frequency and intensity of this pain has worsened in the days leading up to admission, as most recently she has experienced this pain whenever she completes activities of daily living, such as walking around apartment and climbing stairs. In the 48 hours leading up to admission the patient began to notice the onset of this dull, substernal, radiating chest pain at rest. It usually self resolves within a few minutes. She had one episode of nausea, vomiting which occurred the day of presentation but was associated with food intake and not onset of chest pain. Patient has noticed some bilateral hand and ankle swelling in the 24 hours leading up to admission. She notes that her foot swelling was to the point that she had to wear sandals. Patient also endorses frontal headache which began  around the same time she noticed the swelling in her hands/feet. No abdominal pain, dysuria, shortness of breath, fevers, chills, or change in bowel movements. Patient worried about onset of chest pain and it's progression, as her mother died from a heart attack at age 46. Given this she decided to come to ED for evaluation.   Upon arrival to the ED the patient was afebrile, non-tachycardic, hypertensive to 170-200/110-120, and saturating 100% on room air. CBC was significant for hemoglobin of 11.0, with MCV =75 and RDW =20. BMP was significant for Glucose of 527 and creatinine of 1.26 (baseline 0.7-0.8). Bicarb and anion gap were within normal limits. Initial ISTAT troponin = 0.00. Pregnancy test negative. Chest X ray was negative for acute cardiopulmonary process. EKG showed normal sinus rhythm without ST elevation or TWI to suggest ischemia.   Meds:  Current Meds  Medication Sig  . amitriptyline (ELAVIL) 100 MG tablet Take 100 mg by mouth at bedtime.  Marland Kitchen amLODipine (NORVASC) 10 MG tablet TAKE 1 TABLET BY MOUTH EVERY DAY  . baclofen (LIORESAL) 10 MG tablet Take 10 mg by mouth 2 (two) times daily.  . busPIRone (BUSPAR) 10 MG tablet Take 10 mg by mouth 2 (two) times daily.  Marland Kitchen gabapentin (NEURONTIN) 300 MG capsule Take 300 mg by mouth 2 (two) times daily.  Marland Kitchen glipiZIDE (GLUCOTROL) 10 MG tablet TAKE 1 TABLET BY MOUTH TWICE DAILY WITH FOOD  . hydrochlorothiazide (  HYDRODIURIL) 12.5 MG tablet Take 1 tablet (12.5 mg total) by mouth daily.  . insulin aspart (NOVOLOG) 100 UNIT/ML injection Inject 5 Units into the skin 3 (three) times daily with meals.  . insulin glargine (LANTUS) 100 UNIT/ML injection Use 40 units at bedtime and 40 units 12 hours after bedtime dose.  Marland Kitchen LATUDA 80 MG TABS tablet Take 80 mg by mouth every evening.  Marland Kitchen losartan (COZAAR) 100 MG tablet Take 1 tablet (100 mg total) by mouth daily.  Marland Kitchen lovastatin (MEVACOR) 40 MG tablet Take 1 tablet (40 mg total) by mouth at bedtime.  . meloxicam  (MOBIC) 15 MG tablet Take 1 tablet (15 mg total) by mouth daily.  . Multiple Vitamin (MULTI-VITAMINS) TABS Take 1 tablet by mouth daily.  . sitaGLIPtin-metformin (JANUMET) 50-1000 MG tablet Take 1 tablet by mouth 2 (two) times daily with a meal.  . topiramate (TOPAMAX) 100 MG tablet Take 100 mg by mouth at bedtime.  . traZODone (DESYREL) 50 MG tablet Take 50 mg by mouth at bedtime.  . valsartan-hydrochlorothiazide (DIOVAN-HCT) 320-25 MG tablet Take 1 tablet by mouth daily.   Allergies: Allergies as of 05/15/2018 - Review Complete 05/15/2018  Allergen Reaction Noted  . Lyrica [pregabalin] Shortness Of Breath and Rash 12/31/2014  . Rocephin [ceftriaxone sodium in dextrose] Shortness Of Breath 12/16/2017  . Tramadol Shortness Of Breath 04/12/2015  . Keflex [cephalexin] Hives 12/16/2017  . Penicillins Hives 11/26/2011  . Sulfa antibiotics Hives 11/26/2011  . Latex Rash 12/31/2014   Past Medical History: Past Medical History:  Diagnosis Date  . Anxiety   . Arthritis    "knees, feet, back" (05/24/2015)  . Asthma   . Benign essential HTN 05/01/2015  . Bipolar disorder (HCC) diagnosed early 3s  . Chronic back pain   . Chronic bronchitis (HCC)    "get it q yr"  . Chronic lower back pain   . Depression   . Diabetes mellitus type II 2010  . GERD (gastroesophageal reflux disease)   . Headache    "maybe 3 times/wk" (05/24/2015)  . High blood pressure   . History of hiatal hernia   . History of stomach ulcers   . Menopause   . Migraine    "1-2/wk" (05/24/2015)  . OSA on CPAP    Family History:  Family History  Problem Relation Age of Onset  . Bipolar disorder Mother   . CVA Mother   . Bipolar disorder Son   . Depression Brother        schizoaffective d/o  . Hypertension Sister   . Heart disease Maternal Aunt   . Heart attack Maternal Aunt   . Hypertension Brother    Social History:  Patient works as a Advertising copywriter and lives with 46 yo girl of whom she has custody. Patient  current smoker, 1 pack per day for past 15-20 years. No daily alcohol or illicit drug use.   Review of Systems: A complete ROS was negative except as per HPI.   Physical Exam: Blood pressure (!) 172/114, pulse 88, temperature 98.6 F (37 C), temperature source Oral, resp. rate 18, SpO2 100 %.  Physical Exam  Constitutional: She is oriented to person, place, and time. She appears well-developed and well-nourished. No distress.  HENT:  Mouth/Throat: Oropharynx is clear and moist. No oropharyngeal exudate.  Eyes: Conjunctivae and EOM are normal. No scleral icterus.  Cardiovascular: Normal rate and regular rhythm. Exam reveals no gallop and no friction rub.  No murmur heard. 2+ radial, dorsalis pedis pulses  bilaterally  Respiratory: Effort normal. No respiratory distress. She has no wheezes. She has no rales.  GI: Soft. Bowel sounds are normal. She exhibits no distension. There is no tenderness. There is no rebound.  Musculoskeletal: She exhibits edema (trace edema around ankles bilaterally). She exhibits no tenderness (of bilateral lower extremities).  Neurological: She is alert and oriented to person, place, and time.  Face strength and sensation intact bilaterally. Tongue midline. Gross motor and sensation to light touch of upper and lower extremities intact bilaterally.  Skin: Skin is warm and dry. No rash noted. She is not diaphoretic. No erythema.  Psychiatric: She has a normal mood and affect. Her behavior is normal. Thought content normal.   EKG: personally reviewed my interpretation is normal sinus rhythm without TWI or ST elevation to suggest ischemia.  CXR: personally reviewed my interpretation is no vascular congestion or acute opacities noted. Trace pleural effusion on right. No pleural effusions on left.   Assessment & Plan by Problem: Principal Problem:   Chest pain Active Problems:   Hypertensive emergency  Stephanie Thornton is a 58 yo with PMH of HTN, insulin dependent  T2DM, bipolar disorder, and chronic pain who presented to [facility] ED for evaluation of ongoing, progressively worsening chest pain with exertion and at rest for 2 weeks. On admission she was found to have hypertensive emergency. Initial ACS workup, including troponin and EKG, were negative. She was admitted to the internal medicine teaching service for management. The specific problems addressed during admission are as follows.  Hypertensive emergency: Patient admitted with BP >190/100 on multiple readings in ED. Labwork revealed acute AKI, suggesting end organ damage as a result of elevated BP on admission. Repeat BMP showed a return to baseline Cr 0.78 but still endorses ongoing headache, consistent with this diagnosis. Per chart review patient takes hydrochlorothiazide 25 mg and valsartan 320 mg daily as outpatient. Patient was given amlodipine 10, chlorthalidone 25 mg, and losartan 100 mg as this was thought to be her previous home regimen after speaking with patient. Will continue to monitor BP on this medication and plan for PRN intervention as needed. Goal is to slowly reduce BP to MAP 20-25% within first day. -Admit to telemetry -Home chlorthalidone 25 mg daily -Start ibesartan 300 mg daily, home valsartan 320 mg not on hospital formulary (will continue original medication on discharge) -Continue to monitor BP -Will consider IV labetalol for further BP management towards goal as stated above -Repeat BMP in AM  Chest pain: Patient's story of progressively worsening dull, exertional chest pain that is relieved with 1-2 minutes of rest and radiates to L arm/neck, which is consistent with cardiac chest pain. The patient started experiencing this pain at rest in the 48 hours leading up to admission, which is concerning for unstable angina. She is currently chest pain free. Per chart review patient had low-risk myoview in 2016 that did not show any abnormalities for evaluation of acute chest pain and  pre-operative clearance. At that same time patient had echo which showed LVEF 65-70%, mild LVH and G1DD. Will repeat echocardiography and consult cardiology to see if heparin gtt and/or myoview would be beneficial in this high risk patient.  -Cardiology consult pending for further workup -Trend serum troponin levels q6 hours x3 -Echocardiography to evaluate for wall motion abnormalities -Repeat EKG in AM  Insulin dependent T2DM: BG elevated >500 in the ED. Last A1C = 12.3%, indicating poor control at baseline. No evidence of DKA on laboratory studies (normal bicarb, no  anion gap) or clinical evaluation (moist mucous membranes, no nausea/vomiting/abdominal pain). Patient normally takes glipizide 10 mg BID, Janumet 50-1000 mg BID, and Novolog 5 units TID with meals along with Lantus 40 units BID as an outpatient.  -CBG TID with meals, qHS -Lantus 40 qHS +SSI qHS  -Novolog 5 TID with meals + SSI moderate -Lovastatin 40 mg daily, gabapentin 300 mg BID  Bipolar disorder, insomnia: Chronic, stable. -Home amitriptyline 100 mg, topiramate 100 mg, trazodone 50 mg qHS -Home buspirone 10 mg BID and Latuda 80 mg qHS  Iron deficiency anemia: Noted on admission labs, patient not currently taking oral iron because PCP discontinued it's use. Will recommend re-initiation of this therapy given admission labs with current, ongoing microcytic anemia.   FEN/GI: -Heart Healthy/Carb Modified diet -No IVF, replace electrolytes as needed  VTE Prophylaxis: Lovenox daily Code Status: DNR, verified on admission  Dispo: Admit patient to Observation with expected length of stay less than 2 midnights.  SignedRozann Lesches, MD 05/16/2018, 12:25 PM  Pager: [phone]

## 2018-05-16 NOTE — ED Triage Notes (Signed)
TC from ADM  MD  Narenda . Pt  Will need to sign AMA at DC and Pt will need to call  [facility] Monday to make an appt. To been seen .

## 2018-05-19 ENCOUNTER — Encounter: Payer: Self-pay | Admitting: Cardiology

## 2018-05-19 ENCOUNTER — Emergency Department (HOSPITAL_COMMUNITY): Payer: Medicare Other

## 2018-05-19 ENCOUNTER — Observation Stay (HOSPITAL_COMMUNITY)
Admission: EM | Admit: 2018-05-19 | Discharge: 2018-05-20 | Disposition: A | Discharge status: Home / Self Care | Payer: Medicare Other | Attending: Cardiology | Admitting: Cardiology

## 2018-05-19 ENCOUNTER — Ambulatory Visit (INDEPENDENT_AMBULATORY_CARE_PROVIDER_SITE_OTHER): Payer: Medicare Other | Admitting: Cardiology

## 2018-05-19 ENCOUNTER — Other Ambulatory Visit: Payer: Self-pay

## 2018-05-19 ENCOUNTER — Encounter (HOSPITAL_COMMUNITY): Payer: Self-pay

## 2018-05-19 VITALS — BP 110/60 | HR 100 | Ht 62.0 in | Wt 174.8 lb

## 2018-05-19 DIAGNOSIS — K219 Gastro-esophageal reflux disease without esophagitis: Secondary | ICD-10-CM | POA: Insufficient documentation

## 2018-05-19 DIAGNOSIS — E785 Hyperlipidemia, unspecified: Secondary | ICD-10-CM | POA: Insufficient documentation

## 2018-05-19 DIAGNOSIS — E119 Type 2 diabetes mellitus without complications: Secondary | ICD-10-CM

## 2018-05-19 DIAGNOSIS — Z882 Allergy status to sulfonamides status: Secondary | ICD-10-CM | POA: Insufficient documentation

## 2018-05-19 DIAGNOSIS — Z888 Allergy status to other drugs, medicaments and biological substances status: Secondary | ICD-10-CM | POA: Insufficient documentation

## 2018-05-19 DIAGNOSIS — F319 Bipolar disorder, unspecified: Secondary | ICD-10-CM | POA: Diagnosis not present

## 2018-05-19 DIAGNOSIS — E1165 Type 2 diabetes mellitus with hyperglycemia: Secondary | ICD-10-CM | POA: Diagnosis not present

## 2018-05-19 DIAGNOSIS — Z8249 Family history of ischemic heart disease and other diseases of the circulatory system: Secondary | ICD-10-CM | POA: Diagnosis not present

## 2018-05-19 DIAGNOSIS — Z91040 Latex allergy status: Secondary | ICD-10-CM | POA: Insufficient documentation

## 2018-05-19 DIAGNOSIS — Z881 Allergy status to other antibiotic agents status: Secondary | ICD-10-CM | POA: Insufficient documentation

## 2018-05-19 DIAGNOSIS — Z823 Family history of stroke: Secondary | ICD-10-CM | POA: Diagnosis not present

## 2018-05-19 DIAGNOSIS — Z90710 Acquired absence of both cervix and uterus: Secondary | ICD-10-CM | POA: Insufficient documentation

## 2018-05-19 DIAGNOSIS — I152 Hypertension secondary to endocrine disorders: Secondary | ICD-10-CM | POA: Diagnosis present

## 2018-05-19 DIAGNOSIS — J449 Chronic obstructive pulmonary disease, unspecified: Secondary | ICD-10-CM | POA: Insufficient documentation

## 2018-05-19 DIAGNOSIS — Z79899 Other long term (current) drug therapy: Secondary | ICD-10-CM | POA: Insufficient documentation

## 2018-05-19 DIAGNOSIS — Z9889 Other specified postprocedural states: Secondary | ICD-10-CM | POA: Diagnosis not present

## 2018-05-19 DIAGNOSIS — G4733 Obstructive sleep apnea (adult) (pediatric): Secondary | ICD-10-CM | POA: Insufficient documentation

## 2018-05-19 DIAGNOSIS — I10 Essential (primary) hypertension: Secondary | ICD-10-CM | POA: Diagnosis present

## 2018-05-19 DIAGNOSIS — Z880 Allergy status to penicillin: Secondary | ICD-10-CM | POA: Insufficient documentation

## 2018-05-19 DIAGNOSIS — Z794 Long term (current) use of insulin: Secondary | ICD-10-CM | POA: Diagnosis not present

## 2018-05-19 DIAGNOSIS — I25119 Atherosclerotic heart disease of native coronary artery with unspecified angina pectoris: Secondary | ICD-10-CM | POA: Diagnosis not present

## 2018-05-19 DIAGNOSIS — I200 Unstable angina: Secondary | ICD-10-CM | POA: Diagnosis present

## 2018-05-19 DIAGNOSIS — F17210 Nicotine dependence, cigarettes, uncomplicated: Secondary | ICD-10-CM | POA: Diagnosis not present

## 2018-05-19 DIAGNOSIS — F419 Anxiety disorder, unspecified: Secondary | ICD-10-CM | POA: Diagnosis not present

## 2018-05-19 DIAGNOSIS — R079 Chest pain, unspecified: Secondary | ICD-10-CM

## 2018-05-19 DIAGNOSIS — Z8719 Personal history of other diseases of the digestive system: Secondary | ICD-10-CM | POA: Insufficient documentation

## 2018-05-19 DIAGNOSIS — E1100 Type 2 diabetes mellitus with hyperosmolarity without nonketotic hyperglycemic-hyperosmolar coma (NKHHC): Secondary | ICD-10-CM

## 2018-05-19 DIAGNOSIS — R0789 Other chest pain: Secondary | ICD-10-CM | POA: Diagnosis present

## 2018-05-19 LAB — CBC WITH DIFFERENTIAL/PLATELET
Abs Immature Granulocytes: 0.1 10*3/uL (ref 0.0–0.1)
Basophils Absolute: 0 10*3/uL (ref 0.0–0.1)
Basophils Relative: 0 %
Eosinophils Absolute: 0.2 10*3/uL (ref 0.0–0.7)
Eosinophils Relative: 2 %
HCT: 40.3 % (ref 36.0–46.0)
Hemoglobin: 12.4 g/dL (ref 12.0–15.0)
Immature Granulocytes: 0 %
Lymphocytes Relative: 32 %
Lymphs Abs: 3.6 10*3/uL (ref 0.7–4.0)
MCH: 23.3 pg — ABNORMAL LOW (ref 26.0–34.0)
MCHC: 30.8 g/dL (ref 30.0–36.0)
MCV: 75.8 fL — ABNORMAL LOW (ref 78.0–100.0)
Monocytes Absolute: 0.7 10*3/uL (ref 0.1–1.0)
Monocytes Relative: 6 %
Neutro Abs: 6.9 10*3/uL (ref 1.7–7.7)
Neutrophils Relative %: 60 %
Platelets: 274 10*3/uL (ref 150–400)
RBC: 5.32 MIL/uL — ABNORMAL HIGH (ref 3.87–5.11)
RDW: 19 % — ABNORMAL HIGH (ref 11.5–15.5)
WBC: 11.4 10*3/uL — ABNORMAL HIGH (ref 4.0–10.5)

## 2018-05-19 LAB — COMPREHENSIVE METABOLIC PANEL
ALT: 13 U/L — ABNORMAL LOW (ref 14–54)
AST: 17 U/L (ref 15–41)
Albumin: 3.6 g/dL (ref 3.5–5.0)
Alkaline Phosphatase: 118 U/L (ref 38–126)
Anion gap: 18 — ABNORMAL HIGH (ref 5–15)
BUN: 18 mg/dL (ref 6–20)
CO2: 19 mmol/L — ABNORMAL LOW (ref 22–32)
Calcium: 9.6 mg/dL (ref 8.9–10.3)
Chloride: 99 mmol/L — ABNORMAL LOW (ref 101–111)
Creatinine, Ser: 1.04 mg/dL — ABNORMAL HIGH (ref 0.44–1.00)
GFR calc Af Amer: >60 mL/min (ref >60)
GFR calc non Af Amer: >60 mL/min (ref >60)
Glucose, Bld: 394 mg/dL — ABNORMAL HIGH (ref 65–99)
Potassium: 3.6 mmol/L (ref 3.5–5.1)
Sodium: 136 mmol/L (ref 135–145)
Total Bilirubin: 0.4 mg/dL (ref 0.3–1.2)
Total Protein: 7.1 g/dL (ref 6.5–8.1)

## 2018-05-19 LAB — I-STAT TROPONIN, ED: Troponin i, poc: 0 ng/mL (ref 0.00–0.08)

## 2018-05-19 LAB — BASIC METABOLIC PANEL
Anion gap: 10 (ref 5–15)
BUN: 14 mg/dL (ref 6–20)
CO2: 26 mmol/L (ref 22–32)
Calcium: 9.9 mg/dL (ref 8.9–10.3)
Chloride: 99 mmol/L — ABNORMAL LOW (ref 101–111)
Creatinine, Ser: 1.11 mg/dL — ABNORMAL HIGH (ref 0.44–1.00)
GFR calc Af Amer: >60 mL/min (ref >60)
GFR calc non Af Amer: 59 mL/min — ABNORMAL LOW (ref >60)
Glucose, Bld: 377 mg/dL — ABNORMAL HIGH (ref 65–99)
Potassium: 3.3 mmol/L — ABNORMAL LOW (ref 3.5–5.1)
Sodium: 135 mmol/L (ref 135–145)

## 2018-05-19 LAB — CBC
HCT: 37.4 % (ref 36.0–46.0)
Hemoglobin: 11.7 g/dL — ABNORMAL LOW (ref 12.0–15.0)
MCH: 23.4 pg — ABNORMAL LOW (ref 26.0–34.0)
MCHC: 31.3 g/dL (ref 30.0–36.0)
MCV: 74.7 fL — ABNORMAL LOW (ref 78.0–100.0)
Platelets: 266 10*3/uL (ref 150–400)
RBC: 5.01 MIL/uL (ref 3.87–5.11)
RDW: 19.9 % — ABNORMAL HIGH (ref 11.5–15.5)
WBC: 12 10*3/uL — ABNORMAL HIGH (ref 4.0–10.5)

## 2018-05-19 LAB — I-STAT BETA HCG BLOOD, ED (MC, WL, AP ONLY): I-stat hCG, quantitative: <5 m[IU]/mL (ref <5)

## 2018-05-19 LAB — GLUCOSE, CAPILLARY
Glucose-Capillary: 410 mg/dL — ABNORMAL HIGH (ref 65–99)
Glucose-Capillary: 478 mg/dL — ABNORMAL HIGH (ref 65–99)

## 2018-05-19 LAB — APTT: aPTT: >200 seconds (ref 24–36)

## 2018-05-19 LAB — PROTIME-INR
INR: 1.2
Prothrombin Time: 15.1 seconds (ref 11.4–15.2)

## 2018-05-19 LAB — TSH: TSH: 0.334 u[IU]/mL — ABNORMAL LOW (ref 0.350–4.500)

## 2018-05-19 LAB — MAGNESIUM: Magnesium: 1.7 mg/dL (ref 1.7–2.4)

## 2018-05-19 LAB — TROPONIN I: Troponin I: <0.03 ng/mL (ref <0.03)

## 2018-05-19 MED ORDER — SODIUM CHLORIDE 0.9% FLUSH: 3.0000 mL | INTRAVENOUS | Status: DC | PRN

## 2018-05-19 MED ORDER — LOSARTAN POTASSIUM 50 MG PO TABS
100.0000 mg | ORAL_TABLET | Freq: Every day | ORAL | Status: DC
  Administered 2018-05-20: 100 mg via ORAL
  Filled 2018-05-19: qty 2

## 2018-05-19 MED ORDER — LURASIDONE HCL 40 MG PO TABS
80.0000 mg | ORAL_TABLET | Freq: Every day | ORAL | Status: DC
  Administered 2018-05-20: 80 mg via ORAL
  Filled 2018-05-19 (×3): qty 2

## 2018-05-19 MED ORDER — NITROGLYCERIN 0.4 MG SL SUBL: 0.4000 mg | SUBLINGUAL_TABLET | SUBLINGUAL | Status: DC | PRN

## 2018-05-19 MED ORDER — INSULIN ASPART 100 UNIT/ML ~~LOC~~ SOLN
0.0000 [IU] | Freq: Three times a day (TID) | SUBCUTANEOUS | Status: DC
  Administered 2018-05-20: 3 [IU] via SUBCUTANEOUS
  Administered 2018-05-20: 5 [IU] via SUBCUTANEOUS
  Administered 2018-05-20: 15 [IU] via SUBCUTANEOUS

## 2018-05-19 MED ORDER — GABAPENTIN 300 MG PO CAPS
300.0000 mg | ORAL_CAPSULE | Freq: Two times a day (BID) | ORAL | Status: DC
  Administered 2018-05-19 – 2018-05-20 (×2): 300 mg via ORAL
  Filled 2018-05-19 (×2): qty 1

## 2018-05-19 MED ORDER — INSULIN ASPART 100 UNIT/ML ~~LOC~~ SOLN
4.0000 [IU] | Freq: Three times a day (TID) | SUBCUTANEOUS | Status: DC
  Administered 2018-05-20: 4 [IU] via SUBCUTANEOUS

## 2018-05-19 MED ORDER — AMITRIPTYLINE HCL 25 MG PO TABS
150.0000 mg | ORAL_TABLET | Freq: Every day | ORAL | Status: DC
  Administered 2018-05-19: 150 mg via ORAL
  Filled 2018-05-19: qty 6

## 2018-05-19 MED ORDER — INSULIN ASPART 100 UNIT/ML ~~LOC~~ SOLN
10.0000 [IU] | Freq: Once | SUBCUTANEOUS | Status: AC
  Administered 2018-05-19: 10 [IU] via SUBCUTANEOUS

## 2018-05-19 MED ORDER — TRAZODONE HCL 100 MG PO TABS
100.0000 mg | ORAL_TABLET | Freq: Every day | ORAL | Status: DC
  Administered 2018-05-19: 100 mg via ORAL
  Filled 2018-05-19: qty 1

## 2018-05-19 MED ORDER — SODIUM CHLORIDE 0.9 % IV SOLN: 250.0000 mL | INTRAVENOUS | Status: DC | PRN

## 2018-05-19 MED ORDER — ASPIRIN 81 MG PO CHEW
324.0000 mg | CHEWABLE_TABLET | ORAL | Status: DC
  Filled 2018-05-19: qty 4

## 2018-05-19 MED ORDER — SODIUM CHLORIDE 0.9 % WEIGHT BASED INFUSION: 3.0000 mL/kg/h | INTRAVENOUS | Status: AC

## 2018-05-19 MED ORDER — HEPARIN (PORCINE) IN NACL 100-0.45 UNIT/ML-% IJ SOLN
850.0000 [IU]/h | INTRAMUSCULAR | Status: DC
  Administered 2018-05-19: 850 [IU]/h via INTRAVENOUS
  Filled 2018-05-19: qty 250

## 2018-05-19 MED ORDER — TOPIRAMATE 100 MG PO TABS
100.0000 mg | ORAL_TABLET | Freq: Every day | ORAL | Status: DC
  Administered 2018-05-19: 100 mg via ORAL
  Filled 2018-05-19: qty 1

## 2018-05-19 MED ORDER — ASPIRIN EC 81 MG PO TBEC: 81.0000 mg | DELAYED_RELEASE_TABLET | Freq: Every day | ORAL | Status: DC

## 2018-05-19 MED ORDER — ONDANSETRON HCL 4 MG/2ML IJ SOLN: 4.0000 mg | Freq: Four times a day (QID) | INTRAMUSCULAR | Status: DC | PRN

## 2018-05-19 MED ORDER — NITROGLYCERIN IN D5W 200-5 MCG/ML-% IV SOLN
0.0000 ug/min | INTRAVENOUS | Status: DC
  Administered 2018-05-19: 5 ug/min via INTRAVENOUS
  Filled 2018-05-19: qty 250

## 2018-05-19 MED ORDER — PRAVASTATIN SODIUM 40 MG PO TABS
40.0000 mg | ORAL_TABLET | Freq: Every day | ORAL | Status: DC
  Administered 2018-05-20: 40 mg via ORAL
  Filled 2018-05-19: qty 1

## 2018-05-19 MED ORDER — INSULIN ASPART 100 UNIT/ML ~~LOC~~ SOLN: 0.0000 [IU] | Freq: Every day | SUBCUTANEOUS | Status: DC

## 2018-05-19 MED ORDER — BUSPIRONE HCL 5 MG PO TABS
10.0000 mg | ORAL_TABLET | Freq: Two times a day (BID) | ORAL | Status: DC
  Administered 2018-05-19 – 2018-05-20 (×2): 10 mg via ORAL
  Filled 2018-05-19 (×2): qty 2

## 2018-05-19 MED ORDER — SODIUM CHLORIDE 0.9 % WEIGHT BASED INFUSION: 1.0000 mL/kg/h | INTRAVENOUS | Status: DC

## 2018-05-19 MED ORDER — INSULIN GLARGINE 100 UNIT/ML ~~LOC~~ SOLN
30.0000 [IU] | Freq: Two times a day (BID) | SUBCUTANEOUS | Status: DC
  Administered 2018-05-19: 30 [IU] via SUBCUTANEOUS
  Filled 2018-05-19 (×2): qty 0.3

## 2018-05-19 MED ORDER — ASPIRIN 300 MG RE SUPP: 300.0000 mg | RECTAL | Status: DC

## 2018-05-19 MED ORDER — BACLOFEN 10 MG PO TABS
10.0000 mg | ORAL_TABLET | Freq: Two times a day (BID) | ORAL | Status: DC
  Administered 2018-05-19 – 2018-05-20 (×2): 10 mg via ORAL
  Filled 2018-05-19 (×2): qty 1

## 2018-05-19 MED ORDER — ASPIRIN 81 MG PO CHEW
81.0000 mg | CHEWABLE_TABLET | ORAL | Status: AC
  Administered 2018-05-20: 81 mg via ORAL
  Filled 2018-05-19: qty 1

## 2018-05-19 MED ORDER — LURASIDONE HCL 40 MG PO TABS
80.0000 mg | ORAL_TABLET | Freq: Once | ORAL | Status: AC
  Administered 2018-05-19: 80 mg via ORAL
  Filled 2018-05-19: qty 2

## 2018-05-19 MED ORDER — SODIUM CHLORIDE 0.9% FLUSH
3.0000 mL | Freq: Two times a day (BID) | INTRAVENOUS | Status: DC
  Administered 2018-05-20: 3 mL via INTRAVENOUS

## 2018-05-19 MED ORDER — ACETAMINOPHEN 325 MG PO TABS: 650.0000 mg | ORAL_TABLET | ORAL | Status: DC | PRN

## 2018-05-19 MED ORDER — HEPARIN BOLUS VIA INFUSION
4000.0000 [IU] | Freq: Once | INTRAVENOUS | Status: AC
  Administered 2018-05-19: 4000 [IU] via INTRAVENOUS
  Filled 2018-05-19: qty 4000

## 2018-05-19 NOTE — Progress Notes (Signed)
ANTICOAGULATION CONSULT NOTE - Initial Consult  Pharmacy Consult for heparin Indication: chest pain/ACS  Allergies  Allergen Reactions  . Lyrica [Pregabalin] Shortness Of Breath and Rash  . Rocephin [Ceftriaxone Sodium In Dextrose] Shortness Of Breath  . Tramadol Shortness Of Breath    Had asthma attack  . Vancomycin Anaphylaxis    Per Dr. Flonnie Overman  . Keflex [Cephalexin] Hives  . Penicillins Hives    Has patient had a PCN reaction causing immediate rash, facial/tongue/throat swelling, SOB or lightheadedness with hypotension: YES Has patient had a PCN reaction causing severe rash involving mucus membranes or skin necrosis: NO Has patient had a PCN reaction that required hospitalization: YES Has patient had a PCN reaction occurring within the last 10 years: YES If all of the above answers are "NO", then may proceed with Cephalosporin use.  . Sulfa Antibiotics Hives  . Latex Rash    Generally with clear tape, bandaides    Patient Measurements: Height:  (157.5 cm) Weight: 175 lb (79.4 kg) IBW/kg (Calculated) : 50.1 Heparin Dosing Weight: 68 kg  Vital Signs: Temp: 99.2 F (37.3 C) (05/21 1510) Temp Source: Oral (05/21 1510) BP: 101/70 (05/21 1510) Pulse Rate: 92 (05/21 1510)  Labs: Recent Labs    05/19/18 1512  HGB 11.7*  HCT 37.4  PLT 266  CREATININE 1.11*    Estimated Creatinine Clearance: 62.4 mL/min (A) (by C-G formula based on SCr of 1.11 mg/dL (H)).   Medical History: Past Medical History:  Diagnosis Date  . Anxiety   . Arthritis    "knees, feet, back" (05/24/2015)  . Asthma   . Benign essential HTN 05/01/2015  . Bipolar disorder (HCC) diagnosed early 62s  . Chronic bronchitis (HCC)    "get it q yr"  . Chronic lower back pain   . Depression   . Diabetes mellitus type II 2010  . GERD (gastroesophageal reflux disease)   . Headache    "maybe 3 times/wk" (05/24/2015)  . History of hiatal hernia   . History of stomach ulcers   . Menopause   . Migraine     "1-2/wk" (05/24/2015)  . OSA on CPAP     Medications:   (Not in a hospital admission)  Assessment: 34 YOF who presents with a history of chest pain. Cardiology concerned for underlying ischemia. Pharmacy consulted to start IV heparin for ACS. For cardiac cath in the AM. H/H low, Plt wnl. Patient is not on any anticoagulation at home.   Goal of Therapy:  Heparin level 0.3-0.7 units/ml Monitor platelets by anticoagulation protocol: Yes   Plan:  -Heparin 4000 units IV once, then start heparin infusion at 850 units/hr  -F/u 6 hr HL  Vinnie Level, PharmD., BCPS Clinical Pharmacist Clinical phone for 05/19/18 until 11pm: [id number] If after 11pm, please call main pharmacy at: [phone]

## 2018-05-19 NOTE — H&P (Signed)
Expand All Collapse All       Show:Clear all Manual[x] Template[x] Copied  Added by: Quintella Reichert, MD   Hover for details    Cardiology Office Note:    Date:  05/19/2018   ID:  Stephanie Thornton, DOB [birth year], MRN [id number]  PCP:  Loletta Specter, PA-C      Cardiologist:  No primary care provider on file.    Referring MD: Loletta Specter, PA-C      Chief Complaint  Patient presents with  . Chest Pain    History of Present Illness:    Stephanie Thornton is a 47 y.o. female with a hx of HTN, DM, asthma and OSA on CPAP.  He was admitted to [facility] on 05/16/2018 with complaints of chest pain.  Substernal occurring with exertion and relieved with rest.  It would radiate to her neck and left upper extremity associated with shortness of breath.  He was started on carvedilol for blood pressure control and antianginal therapy and statin was added.  It was recommended the patient undergo cardiac catheterization but having a young toddler age 64 she refused to stay for completion of her work-up and signed out AMA.  She is now back for further evaluation.  Of note cardiac enzymes were negative at 0, less than 0.032.  Hemoglobin A1c was elevated at 12.3 with glucose 336 in the hospital.  Was stable at 0.78.  She had a coronary CTA done back in 2013 which showed no significant coronary artery disease and a calcium score of 0 with no stenosis.      Past Medical History:  Diagnosis Date  . Anxiety   . Arthritis    "knees, feet, back" (05/24/2015)  . Asthma   . Benign essential HTN 05/01/2015  . Bipolar disorder (HCC) diagnosed early 32s  . Chronic back pain   . Chronic bronchitis (HCC)    "get it q yr"  . Chronic lower back pain   . Depression   . Diabetes mellitus type II 2010  . GERD (gastroesophageal reflux disease)   . Headache    "maybe 3 times/wk" (05/24/2015)  . High blood pressure   . History of hiatal hernia   . History of  stomach ulcers   . Menopause   . Migraine    "1-2/wk" (05/24/2015)  . OSA on CPAP          Past Surgical History:  Procedure Laterality Date  . ABDOMINAL HYSTERECTOMY  2007  . BUNIONECTOMY Bilateral   . CARPAL TUNNEL RELEASE Right   . HAMMER TOE SURGERY Left   . TONSILLECTOMY Bilateral 05/24/2015   Procedure: TONSILLECTOMY;  Surgeon: Christia Reading, MD;  Location: [facility] OR;  Service: ENT;  Laterality: Bilateral;    Current Medications: ActiveMedications      Current Meds  Medication Sig  . albuterol (PROVENTIL HFA;VENTOLIN HFA) 108 (90 Base) MCG/ACT inhaler Inhale 2 puffs into the lungs every 4 (four) hours as needed for wheezing or shortness of breath (wheezing).  Marland Kitchen amitriptyline (ELAVIL) 100 MG tablet Take 100 mg by mouth at bedtime.  Marland Kitchen amLODipine (NORVASC) 10 MG tablet TAKE 1 TABLET BY MOUTH EVERY DAY  . baclofen (LIORESAL) 10 MG tablet Take 10 mg by mouth 2 (two) times daily.  . busPIRone (BUSPAR) 10 MG tablet Take 10 mg by mouth 2 (two) times daily.  Marland Kitchen gabapentin (NEURONTIN) 300 MG capsule Take 300 mg by mouth 2 (two) times daily.  Marland Kitchen glipiZIDE (GLUCOTROL) 10 MG tablet TAKE  1 TABLET BY MOUTH TWICE DAILY WITH FOOD  . hydrochlorothiazide (HYDRODIURIL) 12.5 MG tablet Take 1 tablet (12.5 mg total) by mouth daily.  . insulin aspart (NOVOLOG) 100 UNIT/ML injection Inject 5 Units into the skin 3 (three) times daily with meals.  . insulin glargine (LANTUS) 100 UNIT/ML injection Use 40 units at bedtime and 40 units 12 hours after bedtime dose.  Marland Kitchen LATUDA 80 MG TABS tablet Take 80 mg by mouth every evening.  Marland Kitchen losartan (COZAAR) 100 MG tablet Take 1 tablet (100 mg total) by mouth daily.  Marland Kitchen lovastatin (MEVACOR) 40 MG tablet Take 1 tablet (40 mg total) by mouth at bedtime.  . meloxicam (MOBIC) 15 MG tablet Take 1 tablet (15 mg total) by mouth daily.  . Multiple Vitamin (MULTI-VITAMINS) TABS Take 1 tablet by mouth daily.  . sitaGLIPtin-metformin (JANUMET) 50-1000 MG tablet Take 1  tablet by mouth 2 (two) times daily with a meal.  . topiramate (TOPAMAX) 100 MG tablet Take 100 mg by mouth at bedtime.  . traZODone (DESYREL) 50 MG tablet Take 50 mg by mouth at bedtime.  . valsartan-hydrochlorothiazide (DIOVAN-HCT) 320-25 MG tablet Take 1 tablet by mouth daily.       Allergies:   Lyrica [pregabalin]; Rocephin [ceftriaxone sodium in dextrose]; Tramadol; Vancomycin; Keflex [cephalexin]; Penicillins; Sulfa antibiotics; and Latex   Social History        Socioeconomic History  . Marital status: Divorced    Spouse name: Not on file  . Number of children: 3  . Years of education: Not on file  . Highest education level: Not on file  Occupational History  . Not on file  Social Needs  . Financial resource strain: Not on file  . Food insecurity:    Worry: Not on file    Inability: Not on file  . Transportation needs:    Medical: Not on file    Non-medical: Not on file  Tobacco Use  . Smoking status: Current Some Day Smoker    Packs/day: 1.00    Years: 30.00    Pack years: 30.00    Types: Cigarettes  . Smokeless tobacco: Never Used  Substance and Sexual Activity  . Alcohol use: No  . Drug use: No  . Sexual activity: Yes  Lifestyle  . Physical activity:    Days per week: Not on file    Minutes per session: Not on file  . Stress: Not on file  Relationships  . Social connections:    Talks on phone: Not on file    Gets together: Not on file    Attends religious service: Not on file    Active member of club or organization: Not on file    Attends meetings of clubs or organizations: Not on file    Relationship status: Not on file  Other Topics Concern  . Not on file  Social History Narrative  . Not on file     Family History: The patient's family history includes Bipolar disorder in her mother and son; CVA in her mother; Depression in her brother; Heart attack in her maternal aunt; Heart disease in her maternal aunt;  Hypertension in her brother and sister.  ROS:   Please see the history of present illness.    ROS  All other systems reviewed and negative.   EKGs/Labs/Other Studies Reviewed:    The following studies were reviewed today: Hospital notes from 05/16/2018  EKG:  EKG is ordered today and showed normal sinus rhythm with septal infarct and  left atrial enlargement.  There is nonspecific ST ab normality.  Recent Labs: 12/16/2017: ALT 13; TSH 0.736 05/16/2018: BUN 8; Creatinine, Ser 0.78; Hemoglobin 11.5; Platelets 259; Potassium 3.4; Sodium 134   Recent Lipid Panel Labs(Brief)          Component Value Date/Time   CHOL 200 (H) 12/16/2017 0922   TRIG 171 (H) 12/16/2017 0922   HDL 43 12/16/2017 0922   CHOLHDL 4.7 (H) 12/16/2017 0922   CHOLHDL 5.0 05/25/2015 1318   VLDL 26 05/25/2015 1318   LDLCALC 123 (H) 12/16/2017 4098      Physical Exam:    VS:  BP 110/60   Pulse 100   Ht  (1.575 m)   Wt 174 lb 12.8 oz (79.3 kg)   SpO2 97%   BMI 31.97 kg/m     Wt Readings from Last 3 Encounters:  05/19/18 174 lb 12.8 oz (79.3 kg)  02/13/18 168 lb (76.2 kg)  12/16/17 176 lb 12.8 oz (80.2 kg)     GEN:  Well nourished, well developed in no acute distress HEENT: Normal NECK: No JVD; No carotid bruits LYMPHATICS: No lymphadenopathy CARDIAC: RRR, no murmurs, rubs, gallops RESPIRATORY:  Clear to auscultation without rales, wheezing or rhonchi  ABDOMEN: Soft, non-tender, non-distended MUSCULOSKELETAL:  No edema; No deformity  SKIN: Warm and dry NEUROLOGIC:  Alert and oriented x 3 PSYCHIATRIC:  Normal affect   ASSESSMENT:    1. Chest pain, unspecified type   2. Benign essential HTN    PLAN:    In order of problems listed above:  1.  Chest pain -she has had a history of remote chest pain in the past with normal cardiac work-up.  She presented 05/16/2018 to the ER with complaints of chest pain worrisome for underlying ischemia.  Cardiac catheterization or  coronary CT were recommended but she left AMA because she did not have anybody to care for her child.  She is still been having episodes of chest pain and currently is having chest pain in the office today.  EKG is nonischemic.  I have recommended that given her significant cardiac risk factors including poorly controlled diabetes and hypertension that she be admitted to the hospital and ruled out for MI.  Will start her on IV heparin drip and get a 2D echocardiogram in the morning.  Make n.p.o. after midnight for cardiac catheterization in the morning.  Start aspirin 81 mg daily and continue on statin therapy.  Hold Janumet for cath.  2.  HTN - BP is controlled on exam today.  She will continue on amlodipine 10 mg daily and  Diovan HCT 320-25 mg daily.  3.  Hyperlipidemia -continue lovastatin 40 mg daily and check an FLP and ALT in the a.m.   Medication Adjustments/Labs and Tests Ordered: Current medicines are reviewed at length with the patient today.  Concerns regarding medicines are outlined above.  No orders of the defined types were placed in this encounter.  No orders of the defined types were placed in this encounter.   Signed, Armanda Magic, MD  05/19/2018 2:03 PM    [facility]

## 2018-05-19 NOTE — ED Provider Notes (Signed)
Patient placed in Quick Look pathway, seen and evaluated   Chief Complaint: CP  HPI:   47 y.o. who presents for evaluation of left-sided chest pain.  Patient reports this pain is been ongoing for the last week.  Was seen in the ED on 05/15/2018 it was recommended for admission.  Patient left AMA.  Patient returned to the [facility] cardiologist today for evaluation and was sent to the ED for further evaluation and possible admission.  Patient reports that her chest pain never improved.  She states that it has continued to occur.  She describes it as a left-sided chest pressure.  She states that she does have some associated nausea, diaphoresis.  She states that the chest pain is worse with exertion.  Patient states that she is a current smoker and smokes approximately 1 pack of cigarettes a day.  Patient does have a history of hypertension and diabetes.  Patient reports that her mom had a heart attack at approximately age 71.  Patient denies any personal cardiac history.  ROS: CP  Physical Exam:   Gen: No distress  Neuro: Awake and Alert  Skin: Warm    Focused Exam: Lungs clear to auscultation bilaterally.  Regular rate and rhythm.   Initiation of care has begun. The patient has been counseled on the process, plan, and necessity for staying for the completion/evaluation, and the remainder of the medical screening examination    Rosana Hoes 05/19/18 1709    Nira Conn, MD 05/20/18 2056

## 2018-05-19 NOTE — Progress Notes (Signed)
Cardiology Office Note:    Date:  05/19/2018   ID:  Stephanie Thornton, DOB September 18, 1971, MRN [id number]  PCP:  Loletta Specter, PA-C  Cardiologist:  No primary care provider on file.    Referring MD: Loletta Specter, PA-C   Chief Complaint  Patient presents with  . Chest Pain    History of Present Illness:    Stephanie Thornton is a 47 y.o. female with a hx of HTN, DM, asthma and OSA on CPAP.  He was admitted to [facility] on 05/16/2018 with complaints of chest pain.  Substernal occurring with exertion and relieved with rest.  It would radiate to her neck and left upper extremity associated with shortness of breath.  He was started on carvedilol for blood pressure control and antianginal therapy and statin was added.  It was recommended the patient undergo cardiac catheterization but having a young toddler age 73 she refused to stay for completion of her work-up and signed out AMA.  She is now back for further evaluation.  Of note cardiac enzymes were negative at 0, less than 0.032.  Hemoglobin A1c was elevated at 12.3 with glucose 336 in the hospital.  Was stable at 0.78.  She had a coronary CTA done back in 2013 which showed no significant coronary artery disease and a calcium score of 0 with no stenosis.  Past Medical History:  Diagnosis Date  . Anxiety   . Arthritis    "knees, feet, back" (05/24/2015)  . Asthma   . Benign essential HTN 05/01/2015  . Bipolar disorder (HCC) diagnosed early 66s  . Chronic back pain   . Chronic bronchitis (HCC)    "get it q yr"  . Chronic lower back pain   . Depression   . Diabetes mellitus type II 2010  . GERD (gastroesophageal reflux disease)   . Headache    "maybe 3 times/wk" (05/24/2015)  . High blood pressure   . History of hiatal hernia   . History of stomach ulcers   . Menopause   . Migraine    "1-2/wk" (05/24/2015)  . OSA on CPAP     Past Surgical History:  Procedure Laterality Date  . ABDOMINAL HYSTERECTOMY  2007  .  BUNIONECTOMY Bilateral   . CARPAL TUNNEL RELEASE Right   . HAMMER TOE SURGERY Left   . TONSILLECTOMY Bilateral 05/24/2015   Procedure: TONSILLECTOMY;  Surgeon: Christia Reading, MD;  Location: [facility] OR;  Service: ENT;  Laterality: Bilateral;    Current Medications: Current Meds  Medication Sig  . albuterol (PROVENTIL HFA;VENTOLIN HFA) 108 (90 Base) MCG/ACT inhaler Inhale 2 puffs into the lungs every 4 (four) hours as needed for wheezing or shortness of breath (wheezing).  Marland Kitchen amitriptyline (ELAVIL) 100 MG tablet Take 100 mg by mouth at bedtime.  Marland Kitchen amLODipine (NORVASC) 10 MG tablet TAKE 1 TABLET BY MOUTH EVERY DAY  . baclofen (LIORESAL) 10 MG tablet Take 10 mg by mouth 2 (two) times daily.  . busPIRone (BUSPAR) 10 MG tablet Take 10 mg by mouth 2 (two) times daily.  Marland Kitchen gabapentin (NEURONTIN) 300 MG capsule Take 300 mg by mouth 2 (two) times daily.  Marland Kitchen glipiZIDE (GLUCOTROL) 10 MG tablet TAKE 1 TABLET BY MOUTH TWICE DAILY WITH FOOD  . hydrochlorothiazide (HYDRODIURIL) 12.5 MG tablet Take 1 tablet (12.5 mg total) by mouth daily.  . insulin aspart (NOVOLOG) 100 UNIT/ML injection Inject 5 Units into the skin 3 (three) times daily with meals.  . insulin glargine (LANTUS) 100  UNIT/ML injection Use 40 units at bedtime and 40 units 12 hours after bedtime dose.  Marland Kitchen LATUDA 80 MG TABS tablet Take 80 mg by mouth every evening.  Marland Kitchen losartan (COZAAR) 100 MG tablet Take 1 tablet (100 mg total) by mouth daily.  Marland Kitchen lovastatin (MEVACOR) 40 MG tablet Take 1 tablet (40 mg total) by mouth at bedtime.  . meloxicam (MOBIC) 15 MG tablet Take 1 tablet (15 mg total) by mouth daily.  . Multiple Vitamin (MULTI-VITAMINS) TABS Take 1 tablet by mouth daily.  . sitaGLIPtin-metformin (JANUMET) 50-1000 MG tablet Take 1 tablet by mouth 2 (two) times daily with a meal.  . topiramate (TOPAMAX) 100 MG tablet Take 100 mg by mouth at bedtime.  . traZODone (DESYREL) 50 MG tablet Take 50 mg by mouth at bedtime.  . valsartan-hydrochlorothiazide  (DIOVAN-HCT) 320-25 MG tablet Take 1 tablet by mouth daily.     Allergies:   Lyrica [pregabalin]; Rocephin [ceftriaxone sodium in dextrose]; Tramadol; Vancomycin; Keflex [cephalexin]; Penicillins; Sulfa antibiotics; and Latex   Social History   Socioeconomic History  . Marital status: Divorced    Spouse name: Not on file  . Number of children: 3  . Years of education: Not on file  . Highest education level: Not on file  Occupational History  . Not on file  Social Needs  . Financial resource strain: Not on file  . Food insecurity:    Worry: Not on file    Inability: Not on file  . Transportation needs:    Medical: Not on file    Non-medical: Not on file  Tobacco Use  . Smoking status: Current Some Day Smoker    Packs/day: 1.00    Years: 30.00    Pack years: 30.00    Types: Cigarettes  . Smokeless tobacco: Never Used  Substance and Sexual Activity  . Alcohol use: No  . Drug use: No  . Sexual activity: Yes  Lifestyle  . Physical activity:    Days per week: Not on file    Minutes per session: Not on file  . Stress: Not on file  Relationships  . Social connections:    Talks on phone: Not on file    Gets together: Not on file    Attends religious service: Not on file    Active member of club or organization: Not on file    Attends meetings of clubs or organizations: Not on file    Relationship status: Not on file  Other Topics Concern  . Not on file  Social History Narrative  . Not on file     Family History: The patient's family history includes Bipolar disorder in her mother and son; CVA in her mother; Depression in her brother; Heart attack in her maternal aunt; Heart disease in her maternal aunt; Hypertension in her brother and sister.  ROS:   Please see the history of present illness.    ROS  All other systems reviewed and negative.   EKGs/Labs/Other Studies Reviewed:    The following studies were reviewed today: Hospital notes from 05/16/2018  EKG:   EKG is ordered today and showed normal sinus rhythm with septal infarct and left atrial enlargement.  There is nonspecific ST ab normality.  Recent Labs: 12/16/2017: ALT 13; TSH 0.736 05/16/2018: BUN 8; Creatinine, Ser 0.78; Hemoglobin 11.5; Platelets 259; Potassium 3.4; Sodium 134   Recent Lipid Panel    Component Value Date/Time   CHOL 200 (H) 12/16/2017 0922   TRIG 171 (H) 12/16/2017 0981  HDL 43 12/16/2017 0922   CHOLHDL 4.7 (H) 12/16/2017 0922   CHOLHDL 5.0 05/25/2015 1318   VLDL 26 05/25/2015 1318   LDLCALC 123 (H) 12/16/2017 1610    Physical Exam:    VS:  BP 110/60   Pulse 100   Ht  (1.575 m)   Wt 174 lb 12.8 oz (79.3 kg)   SpO2 97%   BMI 31.97 kg/m     Wt Readings from Last 3 Encounters:  05/19/18 174 lb 12.8 oz (79.3 kg)  02/13/18 168 lb (76.2 kg)  12/16/17 176 lb 12.8 oz (80.2 kg)     GEN:  Well nourished, well developed in no acute distress HEENT: Normal NECK: No JVD; No carotid bruits LYMPHATICS: No lymphadenopathy CARDIAC: RRR, no murmurs, rubs, gallops RESPIRATORY:  Clear to auscultation without rales, wheezing or rhonchi  ABDOMEN: Soft, non-tender, non-distended MUSCULOSKELETAL:  No edema; No deformity  SKIN: Warm and dry NEUROLOGIC:  Alert and oriented x 3 PSYCHIATRIC:  Normal affect   ASSESSMENT:    1. Chest pain, unspecified type   2. Benign essential HTN    PLAN:    In order of problems listed above:  1.  Chest pain -she has had a history of remote chest pain in the past with normal cardiac work-up.  She presented 05/16/2018 to the ER with complaints of chest pain worrisome for underlying ischemia.  Cardiac catheterization or coronary CT were recommended but she left AMA because she did not have anybody to care for her child.  She is still been having episodes of chest pain and currently is having chest pain in the office today.  EKG is nonischemic.  I have recommended that given her significant cardiac risk factors including poorly  controlled diabetes and hypertension that she be admitted to the hospital and ruled out for MI.  Will start her on IV heparin drip and get a 2D echocardiogram in the morning.  Make n.p.o. after midnight for cardiac catheterization in the morning.  Start aspirin 81 mg daily and continue on statin therapy.  Hold Janumet for cath.  2.  HTN - BP is controlled on exam today.  She will continue on amlodipine 10 mg daily and  Diovan HCT 320-25 mg daily.  3.  Hyperlipidemia -continue lovastatin 40 mg daily and check an FLP and ALT in the a.m.   Medication Adjustments/Labs and Tests Ordered: Current medicines are reviewed at length with the patient today.  Concerns regarding medicines are outlined above.  No orders of the defined types were placed in this encounter.  No orders of the defined types were placed in this encounter.   Signed, Armanda Magic, MD  05/19/2018 2:03 PM    [facility]

## 2018-05-19 NOTE — ED Triage Notes (Signed)
[facility]- pt coming from [facility] with complaint of active chest pain. Left side X1 week. Pt also reports shortness of breath. Pt states she was evaluated for same here and left AMA. No distress noted on arrival, skin warm and dry.  of aspirin given PTA.  CBG 398 116/74 90hr 18rr SPO2 96%

## 2018-05-19 NOTE — ED Notes (Signed)
Attempted report 

## 2018-05-20 ENCOUNTER — Encounter (HOSPITAL_COMMUNITY): Admission: EM | Disposition: A | Discharge status: Home / Self Care | Payer: Self-pay | Source: Home / Self Care

## 2018-05-20 ENCOUNTER — Other Ambulatory Visit: Payer: Self-pay

## 2018-05-20 ENCOUNTER — Ambulatory Visit (HOSPITAL_COMMUNITY): Admission: RE | Admit: 2018-05-20 | Payer: Medicare Other | Source: Ambulatory Visit | Admitting: Cardiology

## 2018-05-20 DIAGNOSIS — I25119 Atherosclerotic heart disease of native coronary artery with unspecified angina pectoris: Secondary | ICD-10-CM | POA: Diagnosis not present

## 2018-05-20 DIAGNOSIS — E1165 Type 2 diabetes mellitus with hyperglycemia: Secondary | ICD-10-CM | POA: Diagnosis not present

## 2018-05-20 DIAGNOSIS — I10 Essential (primary) hypertension: Secondary | ICD-10-CM

## 2018-05-20 DIAGNOSIS — R079 Chest pain, unspecified: Secondary | ICD-10-CM | POA: Diagnosis not present

## 2018-05-20 DIAGNOSIS — E785 Hyperlipidemia, unspecified: Secondary | ICD-10-CM | POA: Diagnosis not present

## 2018-05-20 DIAGNOSIS — I200 Unstable angina: Secondary | ICD-10-CM

## 2018-05-20 HISTORY — PX: LEFT HEART CATH AND CORONARY ANGIOGRAPHY: CATH118249

## 2018-05-20 LAB — BASIC METABOLIC PANEL
Anion gap: 9 (ref 5–15)
BUN: 14 mg/dL (ref 6–20)
CO2: 23 mmol/L (ref 22–32)
Calcium: 8.7 mg/dL — ABNORMAL LOW (ref 8.9–10.3)
Chloride: 103 mmol/L (ref 101–111)
Creatinine, Ser: 0.94 mg/dL (ref 0.44–1.00)
GFR calc Af Amer: >60 mL/min (ref >60)
GFR calc non Af Amer: >60 mL/min (ref >60)
Glucose, Bld: 367 mg/dL — ABNORMAL HIGH (ref 65–99)
Potassium: 4 mmol/L (ref 3.5–5.1)
Sodium: 135 mmol/L (ref 135–145)

## 2018-05-20 LAB — CBC
HCT: 35.2 % — ABNORMAL LOW (ref 36.0–46.0)
Hemoglobin: 11.1 g/dL — ABNORMAL LOW (ref 12.0–15.0)
MCH: 23.2 pg — ABNORMAL LOW (ref 26.0–34.0)
MCHC: 31.5 g/dL (ref 30.0–36.0)
MCV: 73.6 fL — ABNORMAL LOW (ref 78.0–100.0)
Platelets: 220 10*3/uL (ref 150–400)
RBC: 4.78 MIL/uL (ref 3.87–5.11)
RDW: 19.3 % — ABNORMAL HIGH (ref 11.5–15.5)
WBC: 11.7 10*3/uL — ABNORMAL HIGH (ref 4.0–10.5)

## 2018-05-20 LAB — MRSA PCR SCREENING: MRSA by PCR: POSITIVE — AB

## 2018-05-20 LAB — GLUCOSE, CAPILLARY
Glucose-Capillary: 352 mg/dL — ABNORMAL HIGH (ref 65–99)
Glucose-Capillary: 238 mg/dL — ABNORMAL HIGH (ref 65–99)
Glucose-Capillary: 193 mg/dL — ABNORMAL HIGH (ref 65–99)

## 2018-05-20 LAB — HEPARIN LEVEL (UNFRACTIONATED): Heparin Unfractionated: 0.48 IU/mL (ref 0.30–0.70)

## 2018-05-20 LAB — TROPONIN I
Troponin I: <0.03 ng/mL (ref <0.03)
Troponin I: <0.03 ng/mL (ref <0.03)

## 2018-05-20 SURGERY — LEFT HEART CATH AND CORONARY ANGIOGRAPHY
Anesthesia: LOCAL

## 2018-05-20 MED ORDER — ASPIRIN 81 MG PO TBEC: 81.0000 mg | DELAYED_RELEASE_TABLET | Freq: Every day | ORAL | 3 refills | Status: DC

## 2018-05-20 MED ORDER — INSULIN GLARGINE 100 UNIT/ML ~~LOC~~ SOLN
30.0000 [IU] | Freq: Two times a day (BID) | SUBCUTANEOUS | Status: DC
  Filled 2018-05-20: qty 0.3

## 2018-05-20 MED ORDER — FENTANYL CITRATE (PF) 100 MCG/2ML IJ SOLN
INTRAMUSCULAR | Status: DC | PRN
  Administered 2018-05-20: 25 ug via INTRAVENOUS

## 2018-05-20 MED ORDER — VERAPAMIL HCL 2.5 MG/ML IV SOLN
INTRAVENOUS | Status: DC | PRN
  Administered 2018-05-20: 10 mL via INTRA_ARTERIAL

## 2018-05-20 MED ORDER — CHLORHEXIDINE GLUCONATE CLOTH 2 % EX PADS
6.0000 | MEDICATED_PAD | Freq: Every day | CUTANEOUS | Status: DC
  Administered 2018-05-20: 6 via TOPICAL

## 2018-05-20 MED ORDER — HEPARIN SODIUM (PORCINE) 1000 UNIT/ML IJ SOLN
INTRAMUSCULAR | Status: DC | PRN
  Administered 2018-05-20: 4000 [IU] via INTRAVENOUS

## 2018-05-20 MED ORDER — IOHEXOL 350 MG/ML SOLN
INTRAVENOUS | Status: DC | PRN
  Administered 2018-05-20: 50 mL via INTRAVENOUS

## 2018-05-20 MED ORDER — SODIUM CHLORIDE 0.9 % WEIGHT BASED INFUSION: 1.0000 mL/kg/h | INTRAVENOUS | Status: DC

## 2018-05-20 MED ORDER — SODIUM CHLORIDE 0.9 % IV SOLN: INTRAVENOUS | Status: AC

## 2018-05-20 MED ORDER — SODIUM CHLORIDE 0.9 % WEIGHT BASED INFUSION
3.0000 mL/kg/h | INTRAVENOUS | Status: DC
  Administered 2018-05-20: 3 mL/kg/h via INTRAVENOUS

## 2018-05-20 MED ORDER — SODIUM CHLORIDE 0.9 % IV SOLN: 250.0000 mL | INTRAVENOUS | Status: DC | PRN

## 2018-05-20 MED ORDER — SODIUM CHLORIDE 0.9% FLUSH
3.0000 mL | Freq: Two times a day (BID) | INTRAVENOUS | Status: DC
  Administered 2018-05-20: 3 mL via INTRAVENOUS

## 2018-05-20 MED ORDER — ASPIRIN 81 MG PO CHEW: 81.0000 mg | CHEWABLE_TABLET | ORAL | Status: DC

## 2018-05-20 MED ORDER — INSULIN GLARGINE 100 UNIT/ML ~~LOC~~ SOLN
15.0000 [IU] | Freq: Once | SUBCUTANEOUS | Status: AC
  Administered 2018-05-20: 15 [IU] via SUBCUTANEOUS
  Filled 2018-05-20 (×2): qty 0.15

## 2018-05-20 MED ORDER — LIDOCAINE HCL (PF) 1 % IJ SOLN
INTRAMUSCULAR | Status: DC | PRN
  Administered 2018-05-20: 2 mL

## 2018-05-20 MED ORDER — SODIUM CHLORIDE 0.9% FLUSH: 3.0000 mL | INTRAVENOUS | Status: DC | PRN

## 2018-05-20 MED ORDER — MIDAZOLAM HCL 2 MG/2ML IJ SOLN
INTRAMUSCULAR | Status: DC | PRN
  Administered 2018-05-20: 1 mg via INTRAVENOUS

## 2018-05-20 MED ORDER — MUPIROCIN 2 % EX OINT
1.0000 "application " | TOPICAL_OINTMENT | Freq: Two times a day (BID) | CUTANEOUS | Status: DC
  Administered 2018-05-20: 1 via NASAL
  Filled 2018-05-20: qty 22

## 2018-05-20 MED ORDER — HEPARIN (PORCINE) IN NACL 2-0.9 UNITS/ML
INTRAMUSCULAR | Status: AC | PRN
  Administered 2018-05-20 (×2): 500 mL

## 2018-05-20 MED ORDER — SODIUM CHLORIDE 0.9% FLUSH: 3.0000 mL | Freq: Two times a day (BID) | INTRAVENOUS | Status: DC

## 2018-05-20 MED ORDER — SODIUM CHLORIDE 0.9 % WEIGHT BASED INFUSION: 3.0000 mL/kg/h | INTRAVENOUS | Status: DC

## 2018-05-20 SURGICAL SUPPLY — 9 items
CATH OPTITORQUE TIG 4.0 5F (CATHETERS) ×1 IMPLANT
DEVICE RAD COMP TR BAND LRG (VASCULAR PRODUCTS) ×1 IMPLANT
GLIDESHEATH SLEND A-KIT 6F 22G (SHEATH) ×1 IMPLANT
GUIDEWIRE INQWIRE 1.5J.035X260 (WIRE) IMPLANT
INQWIRE 1.5J .035X260CM (WIRE) ×2
KIT HEART LEFT (KITS) ×2 IMPLANT
PACK CARDIAC CATHETERIZATION (CUSTOM PROCEDURE TRAY) ×2 IMPLANT
TRANSDUCER W/STOPCOCK (MISCELLANEOUS) ×2 IMPLANT
TUBING CIL FLEX 10 FLL-RA (TUBING) ×2 IMPLANT

## 2018-05-20 NOTE — Progress Notes (Signed)
Critical APTT called in by lab. APTT >200. Pharmacy notified, pt has heparin infusing. Pharmacist reported that the will follow 0000 heparin lab draw to determine any changes. Will continue to monitor.  Viviano Simas, RN

## 2018-05-20 NOTE — Progress Notes (Signed)
CBG at bedtime 478. Paged cardiology on call regarding insulin orders. Orders received for 1 time dose of 10 units of novolog in addition to 30 units of latus. Will administer and continue to monitor.  Viviano Simas

## 2018-05-20 NOTE — Interval H&P Note (Signed)
History and Physical Interval Note:  05/20/2018 12:54 PM  Stephanie Thornton  has presented today for surgery, with the diagnosis of cp- > Unstable angina.  The various methods of treatment have been discussed with the patient and family. After consideration of risks, benefits and other options for treatment, the patient has consented to  Procedure(s): LEFT HEART CATH AND CORONARY ANGIOGRAPHY (N/A) as a surgical intervention .  The patient's history has been reviewed, patient examined, no change in status, stable for surgery.  I have reviewed the patient's chart and labs.  Questions were answered to the patient's satisfaction.    Cath Lab Visit (complete for each Cath Lab visit)  Clinical Evaluation Leading to the Procedure:   ACS: Yes.    Non-ACS:    Anginal Classification: CCS III  Anti-ischemic medical therapy: Minimal Therapy (1 class of medications)  Non-Invasive Test Results: No non-invasive testing performed  Prior CABG: No previous CABG    Bryan Lemma

## 2018-05-20 NOTE — Progress Notes (Signed)
Progress Note  Patient Name: Stephanie Thornton Date of Encounter: 05/20/2018  Primary Cardiologist:  Armanda Magic, MD  Subjective   No more CP, no SOB. A1c is always high. Does not want to see a nutritionist. Lives w/ her daughter, 46 years old.  Inpatient Medications    Scheduled Meds: . amitriptyline  150 mg Oral QHS  . aspirin  324 mg Oral NOW   Or  . aspirin  300 mg Rectal NOW  . aspirin EC  81 mg Oral Daily  . baclofen  10 mg Oral BID  . busPIRone  10 mg Oral BID  . Chlorhexidine Gluconate Cloth  6 each Topical Q0600  . gabapentin  300 mg Oral BID  . insulin aspart  0-15 Units Subcutaneous TID WC  . insulin aspart  0-5 Units Subcutaneous QHS  . insulin aspart  4 Units Subcutaneous TID WC  . insulin glargine  30 Units Subcutaneous BID  . losartan  100 mg Oral Daily  . lurasidone  80 mg Oral Q supper  . mupirocin ointment  1 application Nasal BID  . pravastatin  40 mg Oral q1800  . sodium chloride flush  3 mL Intravenous Q12H  . sodium chloride flush  3 mL Intravenous Q12H  . topiramate  100 mg Oral QHS  . traZODone  100 mg Oral QHS   Continuous Infusions: . sodium chloride    . sodium chloride    . sodium chloride 1 mL/kg/hr (05/20/18 1610)  . heparin 850 Units/hr (05/19/18 1758)  . nitroGLYCERIN 15 mcg/min (05/19/18 2054)   PRN Meds: sodium chloride, sodium chloride, acetaminophen, nitroGLYCERIN, ondansetron (ZOFRAN) IV, sodium chloride flush, sodium chloride flush   Vital Signs    Vitals:   05/20/18 0106 05/20/18 0206 05/20/18 0306 05/20/18 0448  BP: 113/74 126/78 128/81 129/72  Pulse:    94  Resp: (!) 25 (!) 24 (!) 21 (!) 25  Temp:    98.1 F (36.7 C)  TempSrc:    Oral  SpO2:    92%  Weight:    174 lb 6.4 oz (79.1 kg)  Height:        Intake/Output Summary (Last 24 hours) at 05/20/2018 1028 Last data filed at 05/20/2018 0800 Gross per 24 hour  Intake 501.48 ml  Output -  Net 501.48 ml   Filed Weights   05/19/18 1510 05/19/18 1753 05/20/18 0448   Weight: 175 lb (79.4 kg) 174 lb 12.8 oz (79.3 kg) 174 lb 6.4 oz (79.1 kg)    Telemetry    SR, no sig ectophy - Personally Reviewed  ECG    05/21 Sinus rhythm with septal infarct and left atrial enlargement. Nonspecific ST abnormality. - Personally Reviewed  Physical Exam   General: Well developed, well nourished, female appearing in no acute distress. Head: Normocephalic, atraumatic.  Neck: Supple without bruits, JVD minimally elevated Lungs:  Resp regular and unlabored, few rales bases, ? ATX. Heart: RRR, S1, S2, no S3, S4, or murmur; no rub. Abdomen: Soft, non-tender, non-distended with normoactive bowel sounds. No hepatomegaly. No rebound/guarding. No obvious abdominal masses. Extremities: No clubbing, cyanosis, No edema. Distal pedal pulses are 2+ bilaterally. Neuro: Alert and oriented X 3. Moves all extremities spontaneously. Psych: Normal affect.  Labs    Hematology Recent Labs  Lab 05/19/18 1512 05/19/18 1819 05/20/18 0643  WBC 12.0* 11.4* 11.7*  RBC 5.01 5.32* 4.78  HGB 11.7* 12.4 11.1*  HCT 37.4 40.3 35.2*  MCV 74.7* 75.8* 73.6*  MCH 23.4* 23.3* 23.2*  MCHC 31.3 30.8 31.5  RDW 19.9* 19.0* 19.3*  PLT 266 274 220    Chemistry Recent Labs  Lab 05/19/18 1512 05/19/18 1819 05/20/18 0809  NA 135 136 135  K 3.3* 3.6 4.0  CL 99* 99* 103  CO2 26 19* 23  GLUCOSE 377* 394* 367*  BUN CREATININE 1.11* 1.04* 0.94  CALCIUM 9.9 9.6 8.7*  PROT  --  7.1  --   ALBUMIN  --  3.6  --   AST  --  17  --   ALT  --  13*  --   ALKPHOS  --  118  --   BILITOT  --  0.4  --   GFRNONAA 59* >60 >60  GFRAA >60 >60 >60  ANIONGAP 10 18* 9     Cardiac Enzymes Recent Labs  Lab 05/16/18 1348 05/19/18 1819 05/19/18 2325 05/20/18 0643  TROPONINI <0.03 <0.03 <0.03 <0.03    Recent Labs  Lab 05/15/18 1657 05/19/18 1534  TROPIPOC 0.00 0.00    Lab Results  Component Value Date   HGBA1C 12.3 (H) 05/16/2018    Radiology    Dg Chest 2 View  Result Date:  05/19/2018 CLINICAL DATA:  Chest pain EXAM: CHEST - 2 VIEW COMPARISON:  05/15/2018 FINDINGS: Unchanged mild cardiomegaly. Both lungs are clear. The visualized skeletal structures are unremarkable. IMPRESSION: No active cardiopulmonary disease. Electronically Signed   By: Deatra Robinson M.D.   On: 05/19/2018 15:46   Cardiac Studies   ECHO:  05/16/2018 - Left ventricle: The cavity size was normal. There was moderate   concentric hypertrophy. Systolic function was vigorous. The   estimated ejection fraction was in the range of 65% to 70%. Wall   motion was normal; there were no regional wall motion   abnormalities. There was an increased relative contribution of   atrial contraction to ventricular filling. Doppler parameters are   consistent with abnormal left ventricular relaxation (grade 1   diastolic dysfunction). - Mitral valve: There was trivial regurgitation. - Pulmonary arteries: Systolic pressure could not be accurately   estimated.  Patient Profile     47 y.o. female w/ hx HTN, DM, hyper- dys-lipidemia, bipolar d/o, CP (seen by cards in-hospital 05/18 and cath planned 05/20 but pt left AMA due to child care issues). Admitted from office 05/21 w/ CP>>cath 05/22.   Assessment & Plan     Principal Problem: 1.  Unstable angina (HCC) - ez neg MI - cath planned this pm - no questions or concerns - pain-free now  Active Problems: 2.  DM (diabetes mellitus) (HCC) - poor control at baseline - encouraged diet compliance - give 1/2 dose Lantus this am - o/w continue Lantus 30 U bid and SSI - resume home meal coverage, glucotrol, and Latuda after cath - hold Janumet  3.  Bipolar disorder (HCC) - on home rx  4.  Essential hypertension -  rx was recently changed to olmesartan-amlodipine-HCTZ, this is on hold - on previous dose of losartan 100 mg - also has amlodipine 10 mg and HCTZ 12.5 mg on home rx - BP well-controlled on current rx  Melida Quitter , PA-C 10:28  AM 05/20/2018 Pager: [phone]

## 2018-05-20 NOTE — Discharge Summary (Signed)
Discharge Summary    Patient ID: Stephanie Thornton,  MRN: [id number], DOB/AGE: Aug 17, 1971 47 y.o.  Admit date: 05/19/2018 Discharge date: 05/20/2018  Primary Care Provider: Loletta Specter Primary Cardiologist: Armanda Magic, MD  Discharge Diagnoses    Principal Problem:   Unstable angina [facility]) Active Problems:   Bipolar disorder (HCC)   DM (diabetes mellitus) (HCC)   Essential hypertension   Allergies Allergies  Allergen Reactions  . Lyrica [Pregabalin] Shortness Of Breath and Rash  . Rocephin [Ceftriaxone Sodium In Dextrose] Shortness Of Breath  . Tramadol Shortness Of Breath    Had asthma attack  . Vancomycin Anaphylaxis    Per Dr. Flonnie Overman  . Keflex [Cephalexin] Hives  . Penicillins Hives    Has patient had a PCN reaction causing immediate rash, facial/tongue/throat swelling, SOB or lightheadedness with hypotension: YES Has patient had a PCN reaction causing severe rash involving mucus membranes or skin necrosis: NO Has patient had a PCN reaction that required hospitalization: YES Has patient had a PCN reaction occurring within the last 10 years: YES If all of the above answers are "NO", then may proceed with Cephalosporin use.  . Sulfa Antibiotics Hives  . Latex Rash    Generally with clear tape, bandaides    Diagnostic Studies/Procedures    Left heart cath 05/20/18:  There is hyperdynamic left ventricular systolic function.  The left ventricular ejection fraction is greater than 65% by visual estimate.  LV end diastolic pressure is normal.  There is no aortic valve stenosis.  There is no mitral valve stenosis and no mitral valve prolapse evident.  Mid RCA lesion is 30% stenosed.   Minimal, non-obstructive CAD Consider non-anginal chest pain Normal LVEF & LVEDP   Plan: Return to nursing unit for TR band removal and ongoing care. Consider non-anginal etiology for chest pain.   Echo 05/16/18: Study Conclusions  - Left ventricle: The cavity  size was normal. There was moderate   concentric hypertrophy. Systolic function was vigorous. The   estimated ejection fraction was in the range of 65% to 70%. Wall   motion was normal; there were no regional wall motion   abnormalities. There was an increased relative contribution of   atrial contraction to ventricular filling. Doppler parameters are   consistent with abnormal left ventricular relaxation (grade 1   diastolic dysfunction). - Mitral valve: There was trivial regurgitation. - Pulmonary arteries: Systolic pressure could not be accurately   estimated.   History of Present Illness     Stephanie Zalenski Butcheris a 63 y.o.femalewith a hx of HTN, DM, asthma and OSA on CPAP.He was admitted to [facility] on 05/16/2018 with complaints of chest pain. Substernal occurring with exertion and relieved with rest. It would radiate to her neck and left upper extremity associated with shortness of breath. She was started on carvedilol for blood pressure control and antianginal therapy and statin was added. It was recommended the patient undergo cardiac catheterization but having a young toddler age 63 she refused to stay for completion of her work-upand signed out AMA. She is now back for further evaluation. Of note cardiac enzymes were negative at 0,less than 0.032. Hemoglobin A1c was elevated at 12.3 with glucose 336 in the hospital. She had a coronary CTA done back in 2013 which showed no significant coronary artery disease and a calcium score of 0 with no stenosis.  Hospital Course     Consultants: none  Chest pain She presented with symptoms concerning for angina.  She underwent left heart cath that showed minimal nonosbstructive CAD. Echo with normal LVEF. Given her DM and mild CAD, she was started on ASA 81 mg. Her chest pain is likely not cardiac in origin. She was discharged on a trial of PPI. She has ambulated in the halls without difficulty and on room air. Cath site  C/D/I.  HTN Continue home meds. Pressures have been controlled  HLD 12/16/2017: Cholesterol, Total 200; HDL 43; LDL Calculated 123; Triglycerides 171 Continue lovastatin 40 mg.   OSA Continue CPAP.  DM Poorly controlled. Follow up with PCP.   She has been seen and examined by Dr.  Salvia and is stable for discharge. She should follow up with her PCP.  _____________  Discharge Vitals Blood pressure 114/75, pulse 89, temperature 98.5 F (36.9 C), temperature source Oral, resp. rate 20, height  (1.575 m), weight 174 lb 6.4 oz (79.1 kg), SpO2 100 %.  Filed Weights   05/19/18 1510 05/19/18 1753 05/20/18 0448  Weight: 175 lb (79.4 kg) 174 lb 12.8 oz (79.3 kg) 174 lb 6.4 oz (79.1 kg)    Labs & Radiologic Studies    CBC Recent Labs    05/19/18 1819 05/20/18 0643  WBC 11.4* 11.7*  NEUTROABS 6.9  --   HGB 12.4 11.1*  HCT 40.3 35.2*  MCV 75.8* 73.6*  PLT 274 220   Basic Metabolic Panel Recent Labs    09/81/19 1819 05/20/18 0809  NA 136 135  K 3.6 4.0  CL 99* 103  CO2 19* 23  GLUCOSE 394* 367*  BUN 18 14  CREATININE 1.04* 0.94  CALCIUM 9.6 8.7*  MG 1.7  --    Liver Function Tests Recent Labs    05/19/18 1819  AST 17  ALT 13*  ALKPHOS 118  BILITOT 0.4  PROT 7.1  ALBUMIN 3.6   No results for input(s): LIPASE, AMYLASE in the last 72 hours. Cardiac Enzymes Recent Labs    05/19/18 1819 05/19/18 2325 05/20/18 0643  TROPONINI <0.03 <0.03 <0.03   BNP Invalid input(s): POCBNP D-Dimer No results for input(s): DDIMER in the last 72 hours. Hemoglobin A1C No results for input(s): HGBA1C in the last 72 hours. Fasting Lipid Panel No results for input(s): CHOL, HDL, LDLCALC, TRIG, CHOLHDL, LDLDIRECT in the last 72 hours. Thyroid Function Tests Recent Labs    05/19/18 1819  TSH 0.334*   _____________  Dg Chest 2 View  Result Date: 05/19/2018 CLINICAL DATA:  Chest pain EXAM: CHEST - 2 VIEW COMPARISON:  05/15/2018 FINDINGS: Unchanged mild  cardiomegaly. Both lungs are clear. The visualized skeletal structures are unremarkable. IMPRESSION: No active cardiopulmonary disease. Electronically Signed   By: Deatra Robinson M.D.   On: 05/19/2018 15:46   Dg Chest 2 View  Result Date: 05/15/2018 CLINICAL DATA:  Pt presents to ED with SOB and central CP that radiates to left arm, neck, and back x several weeks, but states pain became worse today. PT states she saw pcp today who told her it looked like she may have had a heart attack. Pt .*comment was truncated* EXAM: CHEST - 2 VIEW COMPARISON:  None. FINDINGS: Normal mediastinum and cardiac silhouette. Mild prominence of the pulmonary vasculature. No evidence of effusion, infiltrate, or pneumothorax. No acute bony abnormality. IMPRESSION: No change from comparison exam.  No acute cardiopulmonary process. Electronically Signed   By: Genevive Bi M.D.   On: 05/15/2018 18:15   Disposition   Pt is being discharged home today in good condition.  Follow-up Plans &  Appointments     Discharge Instructions    Diet - low sodium heart healthy   Complete by:  As directed    Discharge instructions   Complete by:  As directed    No driving for 1 week. No lifting over 5 lbs for 1 week. No sexual activity for 1 week. You may return to work in 1 week. Keep procedure site clean & dry. If you notice increased pain, swelling, bleeding or pus, call/return!  You may shower, but no soaking baths/hot tubs/pools for 1 week.   Increase activity slowly   Complete by:  As directed       Discharge Medications   Allergies as of 05/20/2018      Reactions   Lyrica [pregabalin] Shortness Of Breath, Rash   Rocephin [ceftriaxone Sodium In Dextrose] Shortness Of Breath   Tramadol Shortness Of Breath   Had asthma attack   Vancomycin Anaphylaxis   Per Dr. Flonnie Overman   Keflex [cephalexin] Hives   Penicillins Hives   Has patient had a PCN reaction causing immediate rash, facial/tongue/throat swelling, SOB or  lightheadedness with hypotension: YES Has patient had a PCN reaction causing severe rash involving mucus membranes or skin necrosis: NO Has patient had a PCN reaction that required hospitalization: YES Has patient had a PCN reaction occurring within the last 10 years: YES If all of the above answers are "NO", then may proceed with Cephalosporin use.   Sulfa Antibiotics Hives   Latex Rash   Generally with clear tape, bandaides      Medication List    STOP taking these medications   amLODipine 10 MG tablet Commonly known as:  NORVASC   hydrochlorothiazide 12.5 MG tablet Commonly known as:  HYDRODIURIL   losartan 100 MG tablet Commonly known as:  COZAAR     TAKE these medications   albuterol 108 (90 Base) MCG/ACT inhaler Commonly known as:  PROVENTIL HFA;VENTOLIN HFA Inhale 2 puffs into the lungs every 4 (four) hours as needed for wheezing or shortness of breath (wheezing).   amitriptyline 150 MG tablet Commonly known as:  ELAVIL Take 150 mg by mouth at bedtime.   aspirin 81 MG EC tablet Take 1 tablet (81 mg total) by mouth daily. Start taking on:  05/21/2018   baclofen 10 MG tablet Commonly known as:  LIORESAL Take 10 mg by mouth 2 (two) times daily.   busPIRone 10 MG tablet Commonly known as:  BUSPAR Take 10 mg by mouth 2 (two) times daily.   gabapentin 300 MG capsule Commonly known as:  NEURONTIN Take 300 mg by mouth 2 (two) times daily.   glipiZIDE 10 MG tablet Commonly known as:  GLUCOTROL TAKE 1 TABLET BY MOUTH TWICE DAILY WITH FOOD   insulin aspart 100 UNIT/ML injection Commonly known as:  NOVOLOG Inject 5 Units into the skin 3 (three) times daily with meals.   insulin glargine 100 UNIT/ML injection Commonly known as:  LANTUS Use 40 units at bedtime and 40 units 12 hours after bedtime dose.   LATUDA 80 MG Tabs tablet Generic drug:  lurasidone Take 80 mg by mouth every evening.   lovastatin 40 MG tablet Commonly known as:  MEVACOR Take 1 tablet (40  mg total) by mouth at bedtime.   meloxicam 15 MG tablet Commonly known as:  MOBIC Take 1 tablet (15 mg total) by mouth daily.   MULTI-VITAMINS Tabs Take 1 tablet by mouth daily.   Olmesartan-amLODIPine-HCTZ 40-10-25 MG Tabs Take 1 tablet by mouth every evening.  sitaGLIPtin-metformin 50-1000 MG tablet Commonly known as:  JANUMET Take 1 tablet by mouth 2 (two) times daily with a meal.   topiramate 100 MG tablet Commonly known as:  TOPAMAX Take 100 mg by mouth at bedtime.   traZODone 100 MG tablet Commonly known as:  DESYREL Take 100 mg by mouth at bedtime.           Outstanding Labs/Studies   Follow up - office will call  Duration of Discharge Encounter   Greater than 30 minutes including physician time.  Signed, Roe Rutherford [address] Paarth Cropper, [place] 05/20/2018, 5:24 PM

## 2018-05-20 NOTE — Progress Notes (Signed)
ANTICOAGULATION CONSULT NOTE - Follow Up Consult  Pharmacy Consult for heparin Indication: chest pain/ACS  Labs: Recent Labs    05/19/18 1512 05/19/18 1819 05/19/18 2325  HGB 11.7* 12.4  --   HCT 37.4 40.3  --   PLT 266 274  --   APTT  --  >200*  --   LABPROT  --  15.1  --   INR  --  1.20  --   HEPARINUNFRC  --   --  0.48  CREATININE 1.11* 1.04*  --   TROPONINI  --  <0.03  --     Assessment/Plan:  47yo female therapeutic on heparin with initial dosing for CP. Will continue gtt at current rate and confirm stable with am labs.   Vernard Gambles, PharmD, BCPS  05/20/2018,12:27 AM

## 2018-05-20 NOTE — Care Management Obs Status (Signed)
MEDICARE OBSERVATION STATUS NOTIFICATION   Patient Details  Name: Stephanie Thornton MRN: [id number] Date of Birth: Mar 24, 1971   Medicare Observation Status Notification Given:  Yes    Lawerance Sabal, RN 05/20/2018, 10:01 AM

## 2018-05-20 NOTE — Progress Notes (Signed)
Cardiac catherization revealed mild, non-obstructive CAD.  Her chest pain is not ischemic.  Given her diabetes and mild CAD, recommend continuing her statin and will add ASA  daily.  Will give a one month trial of PPI to see if this helps her chest pain.  She is stable for discharge from a cardiac standpoint.  Follow up with PCP.  Mahiya Kercheval C. Duke Salvia, MD, [facility] 05/20/2018 4:53 PM

## 2018-05-21 ENCOUNTER — Encounter (HOSPITAL_COMMUNITY): Payer: Self-pay | Admitting: Cardiology

## 2018-05-21 MED FILL — Heparin Sod (Porcine)-NaCl IV Soln 1000 Unit/500ML-0.9%: INTRAVENOUS | Qty: 1000 | Status: AC

## 2018-07-01 ENCOUNTER — Other Ambulatory Visit (INDEPENDENT_AMBULATORY_CARE_PROVIDER_SITE_OTHER): Payer: Self-pay | Admitting: Physician Assistant

## 2018-07-01 DIAGNOSIS — I10 Essential (primary) hypertension: Secondary | ICD-10-CM

## 2018-07-01 NOTE — Telephone Encounter (Signed)
FWD to PCP. Salisha Bardsley S Hamdi Vari, CMA  

## 2018-07-25 ENCOUNTER — Ambulatory Visit (HOSPITAL_COMMUNITY)
Admission: EM | Admit: 2018-07-25 | Discharge: 2018-07-25 | Disposition: A | Discharge status: Home / Self Care | Payer: Medicare Other | Attending: Internal Medicine | Admitting: Internal Medicine

## 2018-07-25 ENCOUNTER — Encounter (HOSPITAL_COMMUNITY): Payer: Self-pay | Admitting: *Deleted

## 2018-07-25 ENCOUNTER — Ambulatory Visit (INDEPENDENT_AMBULATORY_CARE_PROVIDER_SITE_OTHER): Payer: Medicare Other

## 2018-07-25 DIAGNOSIS — M25551 Pain in right hip: Secondary | ICD-10-CM

## 2018-07-25 MED ORDER — KETOROLAC TROMETHAMINE 60 MG/2ML IM SOLN
INTRAMUSCULAR | Status: AC
  Filled 2018-07-25: qty 2

## 2018-07-25 MED ORDER — KETOROLAC TROMETHAMINE 60 MG/2ML IM SOLN
60.0000 mg | Freq: Once | INTRAMUSCULAR | Status: AC
  Administered 2018-07-25: 60 mg via INTRAMUSCULAR

## 2018-07-25 MED ORDER — MELOXICAM 15 MG PO TABS: 15.0000 mg | ORAL_TABLET | Freq: Every day | ORAL | 0 refills | Status: DC

## 2018-07-25 NOTE — Discharge Instructions (Signed)
Please restart daily Mobic. Ice application at the end of the day. Activity as tolerated, activity can be beneficial. May use your other pain medications as prescribed by your physician.  See provided exercises.  If persistent symptoms please follow up with orthopedics for further evaluation and treatment.  Please be seen sooner if it develops redness, swelling, severe pain, numbness tingling or weakness.

## 2018-07-25 NOTE — ED Notes (Signed)
Patient transported to X-ray 

## 2018-07-25 NOTE — ED Provider Notes (Signed)
[facility]    CSN: [id number] Arrival date & time: 07/25/18  1010     History   Chief Complaint Chief Complaint  Patient presents with  . Hip Pain    right    HPI Stephanie Thornton is a 47 y.o. female.   Stephanie Thornton presents with complaints of right hip pain which has been ongoing off and on for months which has increased over the past few days. Worse when she wakes up in the morning and worse with walking. Still has pain with rest. Took oxycodone 15mg  this am which did not seem to help, she takes this for chronic back pain. Does not take any antiinflammatories. Pain 6/10 at rest. Does not follow with orthopedist. No injury to hip. No numbness tingling or weakness to the right leg. Pain primarily to inner thigh. Hx of anxiety, arthritis, HTN, bipolar, low back pain, DM, gerd, migraines.     ROS per HPI.      Past Medical History:  Diagnosis Date  . Anxiety   . Arthritis    "knees, feet, back" (05/24/2015)  . Benign essential HTN 05/01/2015  . Bipolar disorder (HCC) diagnosed early 3190s  . Chronic bronchitis (HCC)    "get it q yr"  . Chronic lower back pain   . Depression   . Diabetes mellitus type II 2010  . GERD (gastroesophageal reflux disease)   . Headache    "maybe 3 times/wk" (05/24/2015)  . History of hiatal hernia   . History of stomach ulcers   . Menopause   . Migraine    "1-2/wk" (05/24/2015)  . OSA on CPAP     Patient Active Problem List   Diagnosis Date Noted  . Unstable angina (HCC)   . Essential hypertension   . Depression   . Diabetes type 2, controlled (HCC)   . Chronic tonsillitis 05/24/2015  . Abnormal EKG 05/01/2015  . Preoperative clearance 05/01/2015  . Hx of peritonsillar abscess drainage 01/03/2015  . DM (diabetes mellitus) (HCC) 09/10/2013  . Hyperosmolar non-ketotic state in patient with type 2 diabetes mellitus (HCC) 09/10/2013  . Nausea & vomiting 09/10/2013  . Chest pain 09/10/2013  . Bipolar disorder (HCC)   . Bipolar I  disorder, most recent episode (or current) depressed, severe, without mention of psychotic behavior 11/26/2011    Past Surgical History:  Procedure Laterality Date  . ABDOMINAL HYSTERECTOMY  2007  . BUNIONECTOMY Bilateral   . CARPAL TUNNEL RELEASE Right   . HAMMER TOE SURGERY Left   . LEFT HEART CATH AND CORONARY ANGIOGRAPHY N/A 05/20/2018   Procedure: LEFT HEART CATH AND CORONARY ANGIOGRAPHY;  Surgeon: Marykay LexHarding, David W, MD;  [facility];  Service: Cardiovascular;  Laterality: N/A;  . TONSILLECTOMY Bilateral 05/24/2015   Procedure: TONSILLECTOMY;  Surgeon: Christia Readingwight Bates, MD;  [facility];  Service: ENT;  Laterality: Bilateral;    OB History   None      Home Medications    Prior to Admission medications   Medication Sig Start Date End Date Taking? Authorizing Provider  albuterol (PROVENTIL HFA;VENTOLIN HFA) 108 (90 Base) MCG/ACT inhaler Inhale 2 puffs into the lungs every 4 (four) hours as needed for wheezing or shortness of breath (wheezing). 12/16/17  Yes Loletta SpecterGomez, Roger David, PA-C  aspirin EC 81 MG EC tablet Take 1 tablet (81 mg total) by mouth daily. 05/21/18  Yes Duke, Roe RutherfordAngela Nicole, PA  baclofen (LIORESAL) 10 MG tablet Take 10 mg by mouth 2 (two) times daily. 02/16/17  Yes [provider]  busPIRone (BUSPAR) 10 MG tablet Take 10 mg by mouth 2 (two) times daily. 05/12/18  Yes [provider]  gabapentin (NEURONTIN) 300 MG capsule Take 300 mg by mouth 2 (two) times daily. 04/20/18  Yes [provider]  glipiZIDE (GLUCOTROL) 10 MG tablet TAKE 1 TABLET BY MOUTH TWICE DAILY WITH FOOD 03/09/18  Yes Loletta Specter, PA-C  insulin aspart (NOVOLOG) 100 UNIT/ML injection Inject 5 Units into the skin 3 (three) times daily with meals. 12/16/17  Yes Loletta Specter, PA-C  LATUDA 80 MG TABS tablet Take 80 mg by mouth every evening. 05/12/18  Yes [provider]  lovastatin (MEVACOR) 40 MG tablet Take 1 tablet (40 mg total) by mouth at  bedtime. 12/19/17  Yes Loletta Specter, PA-C  Olmesartan-amLODIPine-HCTZ 40-10-25 MG TABS Take 1 tablet by mouth every evening.  05/15/18  Yes [provider]  sitaGLIPtin-metformin (JANUMET) 50-1000 MG tablet Take 1 tablet by mouth 2 (two) times daily with a meal. 12/16/17  Yes Loletta Specter, PA-C  topiramate (TOPAMAX) 100 MG tablet Take 100 mg by mouth at bedtime. 09/24/17  Yes [provider]  traZODone (DESYREL) 100 MG tablet Take 100 mg by mouth at bedtime.   Yes [provider]  amitriptyline (ELAVIL) 150 MG tablet Take 150 mg by mouth at bedtime.    [provider]  insulin glargine (LANTUS) 100 UNIT/ML injection Use 40 units at bedtime and 40 units 12 hours after bedtime dose. 02/13/18   Loletta Specter, PA-C  meloxicam (MOBIC) 15 MG tablet Take 1 tablet (15 mg total) by mouth daily. 06/28/14   Hyatt, Max T, DPM  Multiple Vitamin (MULTI-VITAMINS) TABS Take 1 tablet by mouth daily.    [provider]    Family History Family History  Problem Relation Age of Onset  . Bipolar disorder Mother   . CVA Mother   . Bipolar disorder Son   . Depression Brother        schizoaffective d/o  . Hypertension Sister   . Heart disease Maternal Aunt   . Heart attack Maternal Aunt   . Hypertension Brother     Social History Social History   Tobacco Use  . Smoking status: Current Some Day Smoker    Packs/day: 1.00    Years: 30.00    Pack years: 30.00    Types: Cigarettes  . Smokeless tobacco: Never Used  Substance Use Topics  . Alcohol use: No  . Drug use: No     Allergies   Lyrica [pregabalin]; Rocephin [ceftriaxone sodium in dextrose]; Tramadol; Vancomycin; Keflex [cephalexin]; Penicillins; Sulfa antibiotics; and Latex   Review of Systems Review of Systems   Physical Exam Triage Vital Signs ED Triage Vitals [07/25/18 1050]  Enc Vitals Group     BP (!) 169/90     Pulse Rate 98     Resp 16     Temp 98.7 F (37.1 C)      Temp Source Oral     SpO2 99 %     Weight 160 lb (72.6 kg)     Height      Head Circumference      Peak Flow      Pain Score 8     Pain Loc      Pain Edu?      Excl. in GC?    No data found.  Updated Vital Signs BP (!) 169/90 (BP Location: Left Arm) Comment: notified joy  Pulse 98   Temp 98.7 F (37.1 C) (Oral)   Resp 16   Wt 160 lb (72.6 kg)   SpO2 99%   BMI 29.26 kg/m    Physical Exam  Constitutional: She is oriented to person, place, and time. She appears well-developed and well-nourished. No distress.  Cardiovascular: Normal rate, regular rhythm and normal heart sounds.  Pulmonary/Chest: Effort normal and breath sounds normal.  Musculoskeletal:       Right hip: She exhibits decreased range of motion. She exhibits normal strength, no tenderness, no bony tenderness, no swelling, no crepitus, no deformity and no laceration.  Right hip pain with log roll as well as pain with flexion and rotation; pain primarily to inner thigh; no tenderness on palpation; sensation intact; strength equal bilaterally; strong and equal pedal pulses; cap refill < 2 seconds; ambulatory  Neurological: She is alert and oriented to person, place, and time.  Skin: Skin is warm and dry.     UC Treatments / Results  Labs (all labs ordered are listed, but only abnormal results are displayed) Labs Reviewed - No data to display  EKG None  Radiology Dg Hip Unilat W Or Wo Pelvis 2-3 Views Right  Result Date: 07/25/2018 CLINICAL DATA:  Right hip pain, chronic, worsening. No reported injury EXAM: DG HIP (WITH OR WITHOUT PELVIS) 2-3V RIGHT COMPARISON:  None. FINDINGS: No pelvic fracture or diastasis. No right hip fracture or dislocation. No suspicious focal osseous lesions. Small sclerotic lesion overlying the right iliac wing, nonspecific, potentially a benign bone island. Curvilinear calcifications are noted anterolateral to the right femoral head. No significant degenerative changes in the right hip.  Mild degenerative changes in the visualized lower lumbar spine. IMPRESSION: No fracture.  No right hip malalignment. Curvilinear calcifications anterolateral to the right femoral head, differential includes dystrophic calcifications from right hip labral pathology or CPPD arthropathy. Electronically Signed   By: Delbert Phenix M.D.   On: 07/25/2018 11:51    Procedures Procedures (including critical care time)  Medications Ordered in UC Medications  ketorolac (TORADOL) injection 60 mg (60 mg Intramuscular Given 07/25/18 1143)    Initial Impression / Assessment and Plan / UC Course  I have reviewed the triage vital signs and the nursing notes.  Pertinent labs & imaging results that were available during my care of the patient were reviewed by me and considered in my medical decision making (see chart for details).     toradol provided in clinic, patient not on an antiinflammatory. States had been on mobic but ran out. This appears to be an acute on chronic issue with exacerbation. Xray obtained, joint intact with calcifications to the anterolateral femoral head. Dystrophic calcifications from labral pathology vs cppd arthropathy considered per radiology. Will restart mobic. Exercises provided. To follow up with orthopedics for further eval and treat if symptoms persist or worsen. Patient verbalized understanding and agreeable to plan.  Ambulatory out of clinic without difficulty.    Final Clinical Impressions(s) / UC Diagnoses   Final diagnoses:  Right hip pain     Discharge Instructions     Please restart daily Mobic. Ice application at the end of the day. Activity as tolerated, activity can be beneficial. May use your other pain medications as prescribed by your physician.  See provided exercises.  If persistent symptoms please follow up with orthopedics for further evaluation and treatment.  Please be seen sooner if it develops redness, swelling, severe pain, numbness tingling or  weakness.     ED  Prescriptions    None     Controlled Substance Prescriptions [place] Controlled Substance Registry consulted? Not Applicable   Georgetta Haber, NP 07/25/18 1201

## 2018-07-25 NOTE — ED Triage Notes (Signed)
Pt complaining of right back and hip pain that started on Thursday. Pt states she's had this pain before but it has been more severe the last several days.

## 2018-08-21 ENCOUNTER — Other Ambulatory Visit (INDEPENDENT_AMBULATORY_CARE_PROVIDER_SITE_OTHER): Payer: Self-pay | Admitting: Physician Assistant

## 2018-09-30 ENCOUNTER — Ambulatory Visit (INDEPENDENT_AMBULATORY_CARE_PROVIDER_SITE_OTHER): Payer: Medicare Other | Admitting: Physician Assistant

## 2018-11-12 ENCOUNTER — Other Ambulatory Visit: Payer: Self-pay | Admitting: Nurse Practitioner

## 2018-11-12 DIAGNOSIS — M5136 Other intervertebral disc degeneration, lumbar region: Secondary | ICD-10-CM

## 2018-11-24 ENCOUNTER — Other Ambulatory Visit: Payer: Self-pay | Admitting: Nurse Practitioner

## 2018-11-24 ENCOUNTER — Ambulatory Visit
Admission: RE | Admit: 2018-11-24 | Discharge: 2018-11-24 | Disposition: A | Discharge status: Home / Self Care | Payer: Medicare Other | Source: Ambulatory Visit | Attending: Nurse Practitioner | Admitting: Nurse Practitioner

## 2018-11-24 ENCOUNTER — Other Ambulatory Visit: Payer: Medicare Other

## 2018-11-24 DIAGNOSIS — M51369 Other intervertebral disc degeneration, lumbar region without mention of lumbar back pain or lower extremity pain: Secondary | ICD-10-CM

## 2018-11-24 DIAGNOSIS — M5136 Other intervertebral disc degeneration, lumbar region: Secondary | ICD-10-CM

## 2018-12-08 ENCOUNTER — Ambulatory Visit
Admission: RE | Admit: 2018-12-08 | Discharge: 2018-12-08 | Disposition: A | Discharge status: Home / Self Care | Payer: Medicare Other | Source: Ambulatory Visit | Attending: Nurse Practitioner | Admitting: Nurse Practitioner

## 2018-12-08 DIAGNOSIS — M5136 Other intervertebral disc degeneration, lumbar region: Secondary | ICD-10-CM

## 2018-12-09 ENCOUNTER — Other Ambulatory Visit: Payer: Medicare Other

## 2018-12-15 ENCOUNTER — Other Ambulatory Visit: Payer: Self-pay

## 2018-12-15 ENCOUNTER — Encounter (INDEPENDENT_AMBULATORY_CARE_PROVIDER_SITE_OTHER): Payer: Self-pay | Admitting: Physician Assistant

## 2018-12-15 ENCOUNTER — Ambulatory Visit (INDEPENDENT_AMBULATORY_CARE_PROVIDER_SITE_OTHER): Payer: Medicare Other | Admitting: Physician Assistant

## 2018-12-15 VITALS — BP 168/99 | HR 93 | Temp 98.3°F | Ht 62.0 in | Wt 174.2 lb

## 2018-12-15 DIAGNOSIS — R739 Hyperglycemia, unspecified: Secondary | ICD-10-CM | POA: Diagnosis not present

## 2018-12-15 DIAGNOSIS — Z794 Long term (current) use of insulin: Secondary | ICD-10-CM

## 2018-12-15 DIAGNOSIS — I10 Essential (primary) hypertension: Secondary | ICD-10-CM

## 2018-12-15 DIAGNOSIS — E7841 Elevated Lipoprotein(a): Secondary | ICD-10-CM | POA: Diagnosis not present

## 2018-12-15 DIAGNOSIS — E1142 Type 2 diabetes mellitus with diabetic polyneuropathy: Secondary | ICD-10-CM

## 2018-12-15 DIAGNOSIS — N3000 Acute cystitis without hematuria: Secondary | ICD-10-CM | POA: Diagnosis not present

## 2018-12-15 DIAGNOSIS — R350 Frequency of micturition: Secondary | ICD-10-CM | POA: Diagnosis not present

## 2018-12-15 LAB — POCT URINALYSIS DIP (CLINITEK)
Bilirubin, UA: NEGATIVE
Blood, UA: NEGATIVE
Glucose, UA: >=1000 mg/dL — AB
Ketones, POC UA: NEGATIVE mg/dL
Leukocytes, UA: NEGATIVE
Nitrite, UA: POSITIVE — AB
POC PROTEIN,UA: NEGATIVE
Spec Grav, UA: 1.01 (ref 1.010–1.025)
Urobilinogen, UA: 1 E.U./dL
pH, UA: 6 (ref 5.0–8.0)

## 2018-12-15 LAB — POCT GLYCOSYLATED HEMOGLOBIN (HGB A1C): Hemoglobin A1C: 12.7 % — AB (ref 4.0–5.6)

## 2018-12-15 LAB — GLUCOSE, POCT (MANUAL RESULT ENTRY)
POC Glucose: 402 mg/dl — AB (ref 70–99)
POC Glucose: 368 mg/dl — AB (ref 70–99)

## 2018-12-15 MED ORDER — MULTI-VITAMINS PO TABS
1.0000 | ORAL_TABLET | Freq: Every day | ORAL | 2 refills | Status: DC
Start: 2018-12-15 — End: 2022-05-09

## 2018-12-15 MED ORDER — LOVASTATIN 40 MG PO TABS: 40.0000 mg | ORAL_TABLET | Freq: Every day | ORAL | 1 refills | Status: DC

## 2018-12-15 MED ORDER — GABAPENTIN 300 MG PO CAPS: 300.0000 mg | ORAL_CAPSULE | Freq: Three times a day (TID) | ORAL | 1 refills | Status: DC

## 2018-12-15 MED ORDER — OLMESARTAN-AMLODIPINE-HCTZ 40-10-25 MG PO TABS: 1.0000 | ORAL_TABLET | Freq: Every evening | ORAL | 1 refills | Status: DC

## 2018-12-15 MED ORDER — INSULIN ASPART 100 UNIT/ML ~~LOC~~ SOLN: SUBCUTANEOUS | 11 refills | Status: DC

## 2018-12-15 MED ORDER — FLUCONAZOLE 150 MG PO TABS: 150.0000 mg | ORAL_TABLET | Freq: Once | ORAL | 0 refills | Status: AC

## 2018-12-15 MED ORDER — CIPROFLOXACIN HCL 500 MG PO TABS: 500.0000 mg | ORAL_TABLET | Freq: Two times a day (BID) | ORAL | 0 refills | Status: AC

## 2018-12-15 MED ORDER — "INSULIN SYRINGE 30G X 1/2"" 1 ML MISC": 1.0000 "application " | Freq: Every day | 5 refills | Status: DC

## 2018-12-15 MED ORDER — SITAGLIPTIN PHOS-METFORMIN HCL 50-1000 MG PO TABS: 1.0000 | ORAL_TABLET | Freq: Two times a day (BID) | ORAL | 1 refills | Status: DC

## 2018-12-15 MED ORDER — INSULIN GLARGINE 100 UNIT/ML ~~LOC~~ SOLN: SUBCUTANEOUS | 5 refills | Status: DC

## 2018-12-15 MED ORDER — ASPIRIN 81 MG PO TBEC: 81.0000 mg | DELAYED_RELEASE_TABLET | Freq: Every day | ORAL | 3 refills | Status: DC

## 2018-12-15 MED ORDER — INSULIN ASPART 100 UNIT/ML ~~LOC~~ SOLN
20.0000 [IU] | Freq: Once | SUBCUTANEOUS | Status: AC
  Administered 2018-12-15: 20 [IU] via SUBCUTANEOUS

## 2018-12-15 NOTE — Progress Notes (Signed)
Subjective:  Patient ID: Stephanie Thornton, female    DOB: Aug 06, 1971  Age: 47 y.o. MRN: [id number]  CC: diabetes  HPI Stephanie Capasso Butcheris a 64 y.o.femalewith a medical history of anxiety, arthritis, asthma, HTN, bipolar disorder, chronic back pain, chronic bronchitis, depression, DM2, GERD, HLD, HTN, hiatal hernia, migraines, and OSA presents on f/u of DM and HTN. Last seen here 10 months ago and was noted to be A1c 11.7% with CBG 416 (two months prior). CBG was 279 at the last visit in February. Lantus insulin was increased to 40 units BID and told to continue Novolog and Janumet. She was advised to return in six weeks for DM f/u but failed to do so. Pt had at one point been referred to Endocrinology but was dismissed due to missing appointments. Pt states her daughter passed away due to glioblastoma last year (05/22/17) on patient's birthday. Pt "lost it" and lost all interest in taking care of herself. Feels she is recovering from the loss and is now willing to take care of her health. Last endocrinology note reveals patient was rx'ed Lantus 40 units qday (reduced from BID), rx'ed Victoza, and Humulin Kwikpen was dc'ed. Endorses polydipsia, polyuria, fatigue, visual blurring, tingling, and numbness. Does not endorse any other symptoms or complaints.     Outpatient Medications Prior to Visit  Medication Sig Dispense Refill  . albuterol (PROVENTIL HFA;VENTOLIN HFA) 108 (90 Base) MCG/ACT inhaler Inhale 2 puffs into the lungs every 4 (four) hours as needed for wheezing or shortness of breath (wheezing). 1 Inhaler 5  . amitriptyline (ELAVIL) 150 MG tablet Take 150 mg by mouth at bedtime.    Marland Kitchen aspirin EC 81 MG EC tablet Take 1 tablet (81 mg total) by mouth daily. 90 tablet 3  . busPIRone (BUSPAR) 10 MG tablet Take 10 mg by mouth 2 (two) times daily.  0  . gabapentin (NEURONTIN) 300 MG capsule Take 300 mg by mouth 2 (two) times daily.  1  . insulin glargine (LANTUS) 100 UNIT/ML injection Use 40 units at  bedtime and 40 units 12 hours after bedtime dose. 10 mL 11  . LATUDA 80 MG TABS tablet Take 80 mg by mouth every evening.  0  . Multiple Vitamin (MULTI-VITAMINS) TABS Take 1 tablet by mouth daily.    Marland Kitchen topiramate (TOPAMAX) 100 MG tablet Take 100 mg by mouth at bedtime.    . traZODone (DESYREL) 100 MG tablet Take 100 mg by mouth at bedtime.    . baclofen (LIORESAL) 10 MG tablet Take 10 mg by mouth 2 (two) times daily.    . insulin aspart (NOVOLOG) 100 UNIT/ML injection Inject 5 Units into the skin 3 (three) times daily with meals. (Patient not taking: Reported on 12/15/2018) 10 mL 11  . lovastatin (MEVACOR) 40 MG tablet Take 1 tablet (40 mg total) by mouth at bedtime. (Patient not taking: Reported on 12/15/2018) 90 tablet 3  . Olmesartan-amLODIPine-HCTZ 40-10-25 MG TABS Take 1 tablet by mouth every evening.   2  . glipiZIDE (GLUCOTROL) 10 MG tablet TAKE 1 TABLET BY MOUTH TWICE DAILY WITH FOOD 60 tablet 0  . meloxicam (MOBIC) 15 MG tablet Take 1 tablet (15 mg total) by mouth daily. 30 tablet 0  . sitaGLIPtin-metformin (JANUMET) 50-1000 MG tablet Take 1 tablet by mouth 2 (two) times daily with a meal. 180 tablet 3   No facility-administered medications prior to visit.      ROS Review of Systems  Constitutional: Positive for malaise/fatigue. Negative for chills  and fever.  Eyes: Positive for blurred vision.  Respiratory: Negative for shortness of breath.   Cardiovascular: Negative for chest pain and palpitations.  Gastrointestinal: Negative for abdominal pain and nausea.  Genitourinary: Positive for frequency. Negative for dysuria and hematuria.  Musculoskeletal: Negative for joint pain and myalgias.  Skin: Negative for rash.  Neurological: Negative for tingling and headaches.  Endo/Heme/Allergies: Positive for polydipsia.  Psychiatric/Behavioral: Negative for depression. The patient is not nervous/anxious.     Objective:  BP (!) 168/99 (BP Location: Left Arm, Patient Position: Sitting,  Cuff Size: Normal)   Pulse 93   Temp 98.3 F (36.8 C) (Oral)   Ht 5\' 2"  (1.575 m)   Wt 174 lb 3.2 oz (79 kg)   SpO2 100%   BMI 31.86 kg/m   BP/Weight 12/15/2018 07/25/2018 05/20/2018  Systolic BP 168 169 114  Diastolic BP 99 90 75  Wt. (Lbs) 174.2 160 174.4  BMI 31.86 29.26 -  Some encounter information is confidential and restricted. Go to Review Flowsheets activity to see all data.      Physical Exam Vitals signs reviewed.  Constitutional:      Comments: Well developed, well nourished, NAD, polite  HENT:     Head: Normocephalic and atraumatic.  Eyes:     General: No scleral icterus. Neck:     Musculoskeletal: Normal range of motion and neck supple.     Thyroid: No thyromegaly.  Cardiovascular:     Rate and Rhythm: Normal rate and regular rhythm.     Heart sounds: Normal heart sounds.  Pulmonary:     Effort: Pulmonary effort is normal.     Breath sounds: Normal breath sounds.  Skin:    General: Skin is warm and dry.     Coloration: Skin is not pale.     Findings: No erythema or rash.  Neurological:     Mental Status: She is alert and oriented to person, place, and time.  Psychiatric:        Behavior: Behavior normal.        Thought Content: Thought content normal.     Comments: Depressed mood, tearful when recounting death of daughter.      Assessment & Plan:    1. Type 2 diabetes mellitus with diabetic polyneuropathy, with long-term current use of insulin (HCC) - HgB A1c 12.7% - Glucose (CBG) 402 then 368  - Recommence insulin glargine (LANTUS) 100 UNIT/ML injection; Use 40 units at bedtime and 40 units 12 hours after bedtime dose.  Dispense: 10 mL; Refill: 5 - Increase to TID from BID, gabapentin (NEURONTIN) 300 MG capsule; Take 1 capsule (300 mg total) by mouth 3 (three) times daily.  Dispense: 270 capsule; Refill: 1 - Recommence sitaGLIPtin-metformin (JANUMET) 50-1000 MG tablet; Take 1 tablet by mouth 2 (two) times daily with a meal.  Dispense: 180  tablet; Refill: 1 - Refill Insulin Syringe-Needle U-100 (INSULIN SYRINGE 1CC/30GX1/2") 30G X 1/2" 1 ML MISC; Inject 1 application into the skin 5 (five) times daily.  Dispense: 150 each; Refill: 5 - Comprehensive metabolic panel - Lipid panel  - Administered insulin aspart (novoLOG) injection 20 Units  2. Hypertension, unspecified type - Refill Olmesartan-amLODIPine-HCTZ 40-10-25 MG TABS; Take 1 tablet by mouth every evening.  Dispense: 90 tablet; Refill: 1 - Refill aspirin 81 MG EC tablet; Take 1 tablet (81 mg total) by mouth daily.  Dispense: 90 tablet; Refill: 3 - Refill Multiple Vitamin (MULTI-VITAMINS) TABS; Take 1 tablet by mouth daily.  Dispense: 30 tablet; Refill: 2 -  Comprehensive metabolic panel - Lipid panel  3. Urinary frequency - POCT URINALYSIS DIP (CLINITEK) - Urine Culture  4. Elevated lipoprotein(a) - Refill lovastatin (MEVACOR) 40 MG tablet; Take 1 tablet (40 mg total) by mouth at bedtime.  Dispense: 90 tablet; Refill: 1 - Lipid panel  5. Acute cystitis without hematuria - Begin ciprofloxacin (CIPRO) 500 MG tablet; Take 1 tablet (500 mg total) by mouth 2 (two) times daily for 3 days.  Dispense: 6 tablet; Refill: 0 - Begin after completion of cipro, fluconazole (DIFLUCAN) 150 MG tablet; Take 1 tablet (150 mg total) by mouth once for 1 dose.  Dispense: 1 tablet; Refill: 0  6. Hyperglycemia - Begin sliding scale insulin aspart (NOVOLOG) 100 UNIT/ML injection; If sugar 150-200 take 2 units If sugar 201-251 take 4 units If sugar 251-300 take 6 units If sugar 301-350 take 8 units If sugar 351-400 take 10 units  Dispense: 10 mL; Refill: 11 - Administered insulin aspart (novoLOG) injection 20 Units   Meds ordered this encounter  Medications  . Olmesartan-amLODIPine-HCTZ 40-10-25 MG TABS    Sig: Take 1 tablet by mouth every evening.    Dispense:  90 tablet    Refill:  1    Order Specific Question:   Supervising Provider    Answer:   Hoy Register [[id number]]  .  lovastatin (MEVACOR) 40 MG tablet    Sig: Take 1 tablet (40 mg total) by mouth at bedtime.    Dispense:  90 tablet    Refill:  1    Order Specific Question:   Supervising Provider    Answer:   Hoy Register [[id number]]  . insulin glargine (LANTUS) 100 UNIT/ML injection    Sig: Use 40 units at bedtime and 40 units 12 hours after bedtime dose.    Dispense:  10 mL    Refill:  5    Order Specific Question:   Supervising Provider    Answer:   Hoy Register [[id number]]  . insulin aspart (NOVOLOG) 100 UNIT/ML injection    Sig: If sugar 150-200 take 2 units If sugar 201-251 take 4 units If sugar 251-300 take 6 units If sugar 301-350 take 8 units If sugar 351-400 take 10 units    Dispense:  10 mL    Refill:  11    Order Specific Question:   Supervising Provider    Answer:   Hoy Register [[id number]]  . gabapentin (NEURONTIN) 300 MG capsule    Sig: Take 1 capsule (300 mg total) by mouth 3 (three) times daily.    Dispense:  270 capsule    Refill:  1    Order Specific Question:   Supervising Provider    Answer:   Hoy Register [[id number]]  . aspirin 81 MG EC tablet    Sig: Take 1 tablet (81 mg total) by mouth daily.    Dispense:  90 tablet    Refill:  3    Order Specific Question:   Supervising Provider    Answer:   Hoy Register [[id number]]  . Multiple Vitamin (MULTI-VITAMINS) TABS    Sig: Take 1 tablet by mouth daily.    Dispense:  30 tablet    Refill:  2    Order Specific Question:   Supervising Provider    Answer:   Hoy Register [[id number]]  . sitaGLIPtin-metformin (JANUMET) 50-1000 MG tablet    Sig: Take 1 tablet by mouth 2 (two) times daily with a meal.    Dispense:  180 tablet    Refill:  1    Order Specific Question:   Supervising Provider    Answer:   Hoy RegisterNEWLIN, ENOBONG [[id number]]  . ciprofloxacin (CIPRO) 500 MG tablet    Sig: Take 1 tablet (500 mg total) by mouth 2 (two) times daily for 3 days.    Dispense:  6 tablet    Refill:  0    Order Specific Question:   Supervising Provider    Answer:    Hoy RegisterNEWLIN, ENOBONG [[id number]]  . fluconazole (DIFLUCAN) 150 MG tablet    Sig: Take 1 tablet (150 mg total) by mouth once for 1 dose.    Dispense:  1 tablet    Refill:  0    Order Specific Question:   Supervising Provider    Answer:   Hoy RegisterNEWLIN, ENOBONG [[id number]]  . Insulin Syringe-Needle U-100 (INSULIN SYRINGE 1CC/30GX1/2") 30G X 1/2" 1 ML MISC    Sig: Inject 1 application into the skin 5 (five) times daily.    Dispense:  150 each    Refill:  5    E11.42    Order Specific Question:   Supervising Provider    Answer:   Hoy RegisterNEWLIN, ENOBONG [[id number]]  . insulin aspart (novoLOG) injection 20 Units    Follow-up: Return in about 3 months (around 03/16/2019) for Diabetes.   Loletta Specteroger David  PA

## 2018-12-15 NOTE — Patient Instructions (Signed)
Diabetes Mellitus and Nutrition When you have diabetes (diabetes mellitus), it is very important to have healthy eating habits because your blood sugar (glucose) levels are greatly affected by what you eat and drink. Eating healthy foods in the appropriate amounts, at about the same times every day, can help you:  Control your blood glucose.  Lower your risk of heart disease.  Improve your blood pressure.  Reach or maintain a healthy weight.  Every person with diabetes is different, and each person has different needs for a meal plan. Your health care provider may recommend that you work with a diet and nutrition specialist (dietitian) to make a meal plan that is best for you. Your meal plan may vary depending on factors such as:  The calories you need.  The medicines you take.  Your weight.  Your blood glucose, blood pressure, and cholesterol levels.  Your activity level.  Other health conditions you have, such as heart or kidney disease.  How do carbohydrates affect me? Carbohydrates affect your blood glucose level more than any other type of food. Eating carbohydrates naturally increases the amount of glucose in your blood. Carbohydrate counting is a method for keeping track of how many carbohydrates you eat. Counting carbohydrates is important to keep your blood glucose at a healthy level, especially if you use insulin or take certain oral diabetes medicines. It is important to know how many carbohydrates you can safely have in each meal. This is different for every person. Your dietitian can help you calculate how many carbohydrates you should have at each meal and for snack. Foods that contain carbohydrates include:  Bread, cereal, rice, pasta, and crackers.  Potatoes and corn.  Peas, beans, and lentils.  Milk and yogurt.  Fruit and juice.  Desserts, such as cakes, cookies, ice cream, and candy.  How does alcohol affect me? Alcohol can cause a sudden decrease in blood  glucose (hypoglycemia), especially if you use insulin or take certain oral diabetes medicines. Hypoglycemia can be a life-threatening condition. Symptoms of hypoglycemia (sleepiness, dizziness, and confusion) are similar to symptoms of having too much alcohol. If your health care provider says that alcohol is safe for you, follow these guidelines:  Limit alcohol intake to no more than 1 drink per day for nonpregnant women and 2 drinks per day for men. One drink equals 12 oz of beer, 5 oz of wine, or 1 oz of hard liquor.  Do not drink on an empty stomach.  Keep yourself hydrated with water, diet soda, or unsweetened iced tea.  Keep in mind that regular soda, juice, and other mixers may contain a lot of sugar and must be counted as carbohydrates.  What are tips for following this plan? Reading food labels  Start by checking the serving size on the label. The amount of calories, carbohydrates, fats, and other nutrients listed on the label are based on one serving of the food. Many foods contain more than one serving per package.  Check the total grams (g) of carbohydrates in one serving. You can calculate the number of servings of carbohydrates in one serving by dividing the total carbohydrates by 15. For example, if a food has 30 g of total carbohydrates, it would be equal to 2 servings of carbohydrates.  Check the number of grams (g) of saturated and trans fats in one serving. Choose foods that have low or no amount of these fats.  Check the number of milligrams (mg) of sodium in one serving. Most people   should limit total sodium intake to less than 2,300 mg per day.  Always check the nutrition information of foods labeled as "low-fat" or "nonfat". These foods may be higher in added sugar or refined carbohydrates and should be avoided.  Talk to your dietitian to identify your daily goals for nutrients listed on the label. Shopping  Avoid buying canned, premade, or processed foods. These  foods tend to be high in fat, sodium, and added sugar.  Shop around the outside edge of the grocery store. This includes fresh fruits and vegetables, bulk grains, fresh meats, and fresh dairy. Cooking  Use low-heat cooking methods, such as baking, instead of high-heat cooking methods like deep frying.  Cook using healthy oils, such as olive, canola, or sunflower oil.  Avoid cooking with butter, cream, or high-fat meats. Meal planning  Eat meals and snacks regularly, preferably at the same times every day. Avoid going long periods of time without eating.  Eat foods high in fiber, such as fresh fruits, vegetables, beans, and whole grains. Talk to your dietitian about how many servings of carbohydrates you can eat at each meal.  Eat 4-6 ounces of lean protein each day, such as lean meat, chicken, fish, eggs, or tofu. 1 ounce is equal to 1 ounce of meat, chicken, or fish, 1 egg, or 1/4 cup of tofu.  Eat some foods each day that contain healthy fats, such as avocado, nuts, seeds, and fish. Lifestyle   Check your blood glucose regularly.  Exercise at least 30 minutes 5 or more days each week, or as told by your health care provider.  Take medicines as told by your health care provider.  Do not use any products that contain nicotine or tobacco, such as cigarettes and e-cigarettes. If you need help quitting, ask your health care provider.  Work with a counselor or diabetes educator to identify strategies to manage stress and any emotional and social challenges. What are some questions to ask my health care provider?  Do I need to meet with a diabetes educator?  Do I need to meet with a dietitian?  What number can I call if I have questions?  When are the best times to check my blood glucose? Where to find more information:  American Diabetes Association: diabetes.org/food-and-fitness/food  Academy of Nutrition and Dietetics:  www.eatright.org/resources/health/diseases-and-conditions/diabetes  National Institute of Diabetes and Digestive and Kidney Diseases (NIH): www.niddk.nih.gov/health-information/diabetes/overview/diet-eating-physical-activity Summary  A healthy meal plan will help you control your blood glucose and maintain a healthy lifestyle.  Working with a diet and nutrition specialist (dietitian) can help you make a meal plan that is best for you.  Keep in mind that carbohydrates and alcohol have immediate effects on your blood glucose levels. It is important to count carbohydrates and to use alcohol carefully. This information is not intended to replace advice given to you by your health care provider. Make sure you discuss any questions you have with your health care provider. Document Released: 09/12/2005 Document Revised: 01/20/2017 Document Reviewed: 01/20/2017 Elsevier Interactive Patient Education  2018 [employer]  

## 2018-12-16 LAB — COMPREHENSIVE METABOLIC PANEL
ALT: 13 IU/L (ref 0–32)
AST: 10 IU/L (ref 0–40)
Albumin/Globulin Ratio: 1.4 (ref 1.2–2.2)
Albumin: 3.9 g/dL (ref 3.5–5.5)
Alkaline Phosphatase: 127 IU/L — ABNORMAL HIGH (ref 39–117)
BUN/Creatinine Ratio: 8 — ABNORMAL LOW (ref 9–23)
BUN: 6 mg/dL (ref 6–24)
Bilirubin Total: <0.2 mg/dL (ref 0.0–1.2)
CO2: 18 mmol/L — ABNORMAL LOW (ref 20–29)
Calcium: 8.8 mg/dL (ref 8.7–10.2)
Chloride: 105 mmol/L (ref 96–106)
Creatinine, Ser: 0.79 mg/dL (ref 0.57–1.00)
GFR calc Af Amer: 104 mL/min/{1.73_m2} (ref >59)
GFR calc non Af Amer: 90 mL/min/{1.73_m2} (ref >59)
Globulin, Total: 2.7 g/dL (ref 1.5–4.5)
Glucose: 383 mg/dL — ABNORMAL HIGH (ref 65–99)
Potassium: 3.6 mmol/L (ref 3.5–5.2)
Sodium: 137 mmol/L (ref 134–144)
Total Protein: 6.6 g/dL (ref 6.0–8.5)

## 2018-12-16 LAB — LIPID PANEL
Chol/HDL Ratio: 4.2 ratio (ref 0.0–4.4)
Cholesterol, Total: 184 mg/dL (ref 100–199)
HDL: 44 mg/dL (ref >39)
LDL Calculated: 109 mg/dL — ABNORMAL HIGH (ref 0–99)
Triglycerides: 157 mg/dL — ABNORMAL HIGH (ref 0–149)
VLDL Cholesterol Cal: 31 mg/dL (ref 5–40)

## 2018-12-19 LAB — URINE CULTURE

## 2018-12-24 ENCOUNTER — Telehealth (INDEPENDENT_AMBULATORY_CARE_PROVIDER_SITE_OTHER): Payer: Self-pay

## 2018-12-24 NOTE — Telephone Encounter (Signed)
-----   Message from Loletta Specteroger David Gomez, PA-C sent at 12/24/2018  9:09 AM EST ----- E coli found in urine. Ciprofloxacin is the correct antibiotic to kill this strain. Pt should be having less urinary frequency and feeling better.

## 2018-12-24 NOTE — Telephone Encounter (Signed)
Left voicemail notifying patient that E coli found in urine. cipro is the correct antibiotic to kill this strain. Should be having less urinary frequency and feeling better. Asked patient to return call to [facility] to report how she is feeling now. Maryjean Mornempestt S Roberts, CMA

## 2018-12-24 NOTE — Telephone Encounter (Signed)
Patient returned call and reported that she is feeling better. Stephanie Thornton, CMA

## 2018-12-26 ENCOUNTER — Ambulatory Visit
Admission: RE | Admit: 2018-12-26 | Discharge: 2018-12-26 | Disposition: A | Discharge status: Home / Self Care | Payer: Medicare Other | Source: Ambulatory Visit | Attending: Nurse Practitioner | Admitting: Nurse Practitioner

## 2018-12-26 DIAGNOSIS — M5136 Other intervertebral disc degeneration, lumbar region: Secondary | ICD-10-CM

## 2018-12-26 MED ORDER — GADOBENATE DIMEGLUMINE 529 MG/ML IV SOLN
15.0000 mL | Freq: Once | INTRAVENOUS | Status: AC | PRN
  Administered 2018-12-26: 15 mL via INTRAVENOUS

## 2019-01-21 ENCOUNTER — Ambulatory Visit (INDEPENDENT_AMBULATORY_CARE_PROVIDER_SITE_OTHER): Payer: Medicare Other | Admitting: Family Medicine

## 2019-03-04 ENCOUNTER — Encounter (INDEPENDENT_AMBULATORY_CARE_PROVIDER_SITE_OTHER): Payer: Self-pay | Admitting: Primary Care

## 2019-03-04 ENCOUNTER — Other Ambulatory Visit: Payer: Self-pay

## 2019-03-04 ENCOUNTER — Ambulatory Visit (INDEPENDENT_AMBULATORY_CARE_PROVIDER_SITE_OTHER): Payer: Medicare Other | Admitting: Primary Care

## 2019-03-04 VITALS — BP 146/94 | HR 97 | Temp 98.1°F | Ht 62.0 in | Wt 181.0 lb

## 2019-03-04 DIAGNOSIS — E1142 Type 2 diabetes mellitus with diabetic polyneuropathy: Secondary | ICD-10-CM | POA: Diagnosis not present

## 2019-03-04 DIAGNOSIS — N390 Urinary tract infection, site not specified: Secondary | ICD-10-CM

## 2019-03-04 DIAGNOSIS — Z794 Long term (current) use of insulin: Secondary | ICD-10-CM

## 2019-03-04 DIAGNOSIS — Z760 Encounter for issue of repeat prescription: Secondary | ICD-10-CM | POA: Diagnosis not present

## 2019-03-04 DIAGNOSIS — R300 Dysuria: Secondary | ICD-10-CM

## 2019-03-04 LAB — POCT URINALYSIS DIP (CLINITEK)
Bilirubin, UA: NEGATIVE
Blood, UA: NEGATIVE
Glucose, UA: 500 mg/dL — AB
Ketones, POC UA: NEGATIVE mg/dL
Nitrite, UA: POSITIVE — AB
POC PROTEIN,UA: NEGATIVE
Spec Grav, UA: 1.02 (ref 1.010–1.025)
Urobilinogen, UA: 1 E.U./dL
pH, UA: 6 (ref 5.0–8.0)

## 2019-03-04 LAB — GLUCOSE, POCT (MANUAL RESULT ENTRY): POC Glucose: 278 mg/dl — AB (ref 70–99)

## 2019-03-04 MED ORDER — ALBUTEROL SULFATE HFA 108 (90 BASE) MCG/ACT IN AERS: 1.0000 | INHALATION_SPRAY | Freq: Four times a day (QID) | RESPIRATORY_TRACT | 2 refills | Status: DC | PRN

## 2019-03-04 MED ORDER — CIPROFLOXACIN HCL 500 MG PO TABS: 500.0000 mg | ORAL_TABLET | Freq: Two times a day (BID) | ORAL | 0 refills | Status: DC

## 2019-03-04 MED ORDER — FLUCONAZOLE 150 MG PO TABS: 150.0000 mg | ORAL_TABLET | Freq: Two times a day (BID) | ORAL | 0 refills | Status: DC

## 2019-03-04 NOTE — Progress Notes (Signed)
Acute Office Visit  Subjective:    Patient ID: Stephanie Thornton, female    DOB: [birth year], 48 y.o.   MRN: [id number]  Chief Complaint  Patient presents with  . Urinary Tract Infection    urinary frequency/dysuria/foul odor     HPI Patient is in today for burning and urinary frequency. Past medical history of anxiety, arthritis, asthma, HTN, bipolar disorder, chronic back pain, chronic bronchitis, depression, DM2, GERD, HLD, HTN, hiatal hernia, migraines, and OSA . Past Medical History:  Diagnosis Date  . Anxiety   . Arthritis    "knees, feet, back" (05/24/2015)  . Benign essential HTN 05/01/2015  . Bipolar disorder (HCC) diagnosed early 21s  . Chronic bronchitis (HCC)    "get it q yr"  . Chronic lower back pain   . Depression   . Diabetes mellitus type II 2010  . GERD (gastroesophageal reflux disease)   . Headache    "maybe 3 times/wk" (05/24/2015)  . History of hiatal hernia   . History of stomach ulcers   . Menopause   . Migraine    "1-2/wk" (05/24/2015)  . OSA on CPAP     Past Surgical History:  Procedure Laterality Date  . ABDOMINAL HYSTERECTOMY  2007  . BUNIONECTOMY Bilateral   . CARPAL TUNNEL RELEASE Right   . HAMMER TOE SURGERY Left   . LEFT HEART CATH AND CORONARY ANGIOGRAPHY N/A 05/20/2018   Procedure: LEFT HEART CATH AND CORONARY ANGIOGRAPHY;  Surgeon: Marykay Lex, MD;  Location: [facility] INVASIVE CV LAB;  Service: Cardiovascular;  Laterality: N/A;  . TONSILLECTOMY Bilateral 05/24/2015   Procedure: TONSILLECTOMY;  Surgeon: Christia Reading, MD;  Location: [facility] OR;  Service: ENT;  Laterality: Bilateral;    Family History  Problem Relation Age of Onset  . Bipolar disorder Mother   . CVA Mother   . Bipolar disorder Son   . Depression Brother        schizoaffective d/o  . Hypertension Sister   . Heart disease Maternal Aunt   . Heart attack Maternal Aunt   . Hypertension Brother     Social History   Socioeconomic History  . Marital status: Divorced    Spouse  name: Not on file  . Number of children: 3  . Years of education: Not on file  . Highest education level: Not on file  Occupational History  . Not on file  Social Needs  . Financial resource strain: Not on file  . Food insecurity:    Worry: Not on file    Inability: Not on file  . Transportation needs:    Medical: Not on file    Non-medical: Not on file  Tobacco Use  . Smoking status: Current Some Day Smoker    Packs/day: 1.00    Years: 30.00    Pack years: 30.00    Types: Cigarettes  . Smokeless tobacco: Never Used  Substance and Sexual Activity  . Alcohol use: No  . Drug use: No  . Sexual activity: Yes  Lifestyle  . Physical activity:    Days per week: Not on file    Minutes per session: Not on file  . Stress: Not on file  Relationships  . Social connections:    Talks on phone: Not on file    Gets together: Not on file    Attends religious service: Not on file    Active member of club or organization: Not on file    Attends meetings of clubs or organizations: Not  on file    Relationship status: Not on file  . Intimate partner violence:    Fear of current or ex partner: Not on file    Emotionally abused: Not on file    Physically abused: Not on file    Forced sexual activity: Not on file  Other Topics Concern  . Not on file  Social History Narrative  . Not on file    Outpatient Medications Prior to Visit  Medication Sig Dispense Refill  . aspirin 81 MG EC tablet Take 1 tablet (81 mg total) by mouth daily. 90 tablet 3  . gabapentin (NEURONTIN) 300 MG capsule Take 1 capsule (300 mg total) by mouth 3 (three) times daily. 270 capsule 1  . insulin aspart (NOVOLOG) 100 UNIT/ML injection If sugar 150-200 take 2 units If sugar 201-251 take 4 units If sugar 251-300 take 6 units If sugar 301-350 take 8 units If sugar 351-400 take 10 units 10 mL 11  . insulin glargine (LANTUS) 100 UNIT/ML injection Use 40 units at bedtime and 40 units 12 hours after bedtime dose. 10 mL  5  . Insulin Syringe-Needle U-100 (INSULIN SYRINGE 1CC/30GX1/2") 30G X 1/2" 1 ML MISC Inject 1 application into the skin 5 (five) times daily. 150 each 5  . lovastatin (MEVACOR) 40 MG tablet Take 1 tablet (40 mg total) by mouth at bedtime. 90 tablet 1  . Multiple Vitamin (MULTI-VITAMINS) TABS Take 1 tablet by mouth daily. 30 tablet 2  . Olmesartan-amLODIPine-HCTZ 40-10-25 MG TABS Take 1 tablet by mouth every evening. 90 tablet 1  . sitaGLIPtin-metformin (JANUMET) 50-1000 MG tablet Take 1 tablet by mouth 2 (two) times daily with a meal. 180 tablet 1  . amitriptyline (ELAVIL) 150 MG tablet Take 150 mg by mouth daily.    . busPIRone (BUSPAR) 10 MG tablet Take 10 mg by mouth 3 (three) times daily.    Marland Kitchen LATUDA 80 MG TABS tablet Take 80 mg by mouth daily.    Marland Kitchen oxyCODONE (ROXICODONE) 15 MG immediate release tablet Take 15 mg by mouth 4 (four) times daily as needed.    . topiramate (TOPAMAX) 200 MG tablet Take 200 mg by mouth daily.    . traZODone (DESYREL) 100 MG tablet Take 100 mg by mouth daily.    Marland Kitchen albuterol (PROVENTIL HFA;VENTOLIN HFA) 108 (90 Base) MCG/ACT inhaler Inhale into the lungs.     No facility-administered medications prior to visit.     Allergies  Allergen Reactions  . Lyrica [Pregabalin] Shortness Of Breath and Rash  . Rocephin [Ceftriaxone Sodium In Dextrose] Shortness Of Breath  . Tramadol Shortness Of Breath    Had asthma attack  . Vancomycin Anaphylaxis    Per Dr. Flonnie Overman  . Glipizide Hives  . Keflex [Cephalexin] Hives  . Penicillins Hives    Has patient had a PCN reaction causing immediate rash, facial/tongue/throat swelling, SOB or lightheadedness with hypotension: YES Has patient had a PCN reaction causing severe rash involving mucus membranes or skin necrosis: NO Has patient had a PCN reaction that required hospitalization: YES Has patient had a PCN reaction occurring within the last 10 years: YES If all of the above answers are "NO", then may proceed with  Cephalosporin use.  . Sulfa Antibiotics Hives  . Latex Rash    Generally with clear tape, bandaides  . Sitagliptin-Metformin Hcl Nausea Only    Review of Systems  Constitutional: Negative.   HENT: Negative.   Eyes: Negative.   Respiratory: Negative.   Cardiovascular: Negative.  Genitourinary: Positive for dysuria and frequency.  Musculoskeletal: Negative.   Skin: Negative.   Neurological: Negative.   Endo/Heme/Allergies: Negative.   Psychiatric/Behavioral: Negative.        Objective:    Physical Exam  Constitutional: She is oriented to person, place, and time. She appears well-developed.  HENT:  Head: Normocephalic.  Eyes: Pupils are equal, round, and reactive to light. EOM are normal.  Neck: Normal range of motion. Neck supple.  Cardiovascular: Normal rate and regular rhythm.  Pulmonary/Chest: Effort normal and breath sounds normal.  Abdominal: Soft. Bowel sounds are normal.  Musculoskeletal: Normal range of motion.  Neurological: She is alert and oriented to person, place, and time.  Skin: Skin is warm and dry.  Psychiatric: She has a normal mood and affect.    BP (!) 146/94 (BP Location: Left Arm, Patient Position: Sitting, Cuff Size: Normal)   Pulse 97   Temp 98.1 F (36.7 C) (Oral)   Ht 5\' 2"  (1.575 m)   Wt 181 lb (82.1 kg)   SpO2 100%   BMI 33.11 kg/m  Wt Readings from Last 3 Encounters:  03/04/19 181 lb (82.1 kg)  12/15/18 174 lb 3.2 oz (79 kg)  07/25/18 160 lb (72.6 kg)    Health Maintenance Due  Topic Date Due  . FOOT EXAM  05/22/1982  . OPHTHALMOLOGY EXAM  05/22/1982  . PAP SMEAR-Modifier  05/22/1993  . INFLUENZA VACCINE  07/30/2018    There are no preventive care reminders to display for this patient.   Lab Results  Component Value Date   TSH 0.334 (L) 05/19/2018   Lab Results  Component Value Date   WBC 11.7 (H) 05/20/2018   HGB 11.1 (L) 05/20/2018   HCT 35.2 (L) 05/20/2018   MCV 73.6 (L) 05/20/2018   PLT 220 05/20/2018   Lab  Results  Component Value Date   NA 137 12/15/2018   K 3.6 12/15/2018   CO2 18 (L) 12/15/2018   GLUCOSE 383 (H) 12/15/2018   BUN 6 12/15/2018   CREATININE 0.79 12/15/2018   BILITOT <0.2 12/15/2018   ALKPHOS 127 (H) 12/15/2018   AST 10 12/15/2018   ALT 13 12/15/2018   PROT 6.6 12/15/2018   ALBUMIN 3.9 12/15/2018   CALCIUM 8.8 12/15/2018   ANIONGAP 9 05/20/2018   Lab Results  Component Value Date   CHOL 184 12/15/2018   Lab Results  Component Value Date   HDL 44 12/15/2018   Lab Results  Component Value Date   LDLCALC 109 (H) 12/15/2018   Lab Results  Component Value Date   TRIG 157 (H) 12/15/2018   Lab Results  Component Value Date   CHOLHDL 4.2 12/15/2018   Lab Results  Component Value Date   HGBA1C 12.7 (A) 12/15/2018       Assessment & Plan:  1. Type 2 diabetes mellitus with diabetic polyneuropathy, with long-term current use of insulin (HCC) Honey nut cheerio for bkf  - Glucose (CBG)  2. Dysuria - POCT URINALYSIS DIP (CLINITEK)  3. Urinary tract infection without hematuria, site unspecified WBC in urine dip sticks  tx with Cipro and diflucan - Urine Culture Problem List Items Addressed This Visit    DM (diabetes mellitus) (HCC) - Primary   Relevant Orders   Glucose (CBG) (Completed)    Other Visit Diagnoses    Dysuria       Relevant Orders   POCT URINALYSIS DIP (CLINITEK) (Completed)   Urinary tract infection without hematuria, site unspecified  Relevant Medications   fluconazole (DIFLUCAN) 150 MG tablet   Other Relevant Orders   Urine Culture   Medication refill       Relevant Medications   albuterol (PROVENTIL HFA;VENTOLIN HFA) 108 (90 Base) MCG/ACT inhaler       Meds ordered this encounter  Medications  . fluconazole (DIFLUCAN) 150 MG tablet    Sig: Take 1 tablet (150 mg total) by mouth 2 (two) times daily.    Dispense:  2 tablet    Refill:  0  . ciprofloxacin (CIPRO) 500 MG tablet    Sig: Take 1 tablet (500 mg total) by  mouth 2 (two) times daily.    Dispense:  6 tablet    Refill:  0  . albuterol (PROVENTIL HFA;VENTOLIN HFA) 108 (90 Base) MCG/ACT inhaler    Sig: Inhale 1-2 puffs into the lungs every 6 (six) hours as needed for wheezing or shortness of breath.    Dispense:  1 Inhaler    Refill:  2     Grayce Sessions, NP

## 2019-03-04 NOTE — Patient Instructions (Signed)
Asymptomatic Bacteriuria  Asymptomatic bacteriuria is the presence of a large number of bacteria in the urine without the usual symptoms of burning or frequent urination. What are the causes? This condition is caused by an increase in bacteria in the urine. This increase can be caused by:  Bacteria entering the urinary tract, such as during sex.  A blockage in the urinary tract, such as from kidney stones or a tumor.  Bladder problems that prevent the bladder from emptying. What increases the risk? You are more likely to develop this condition if:  You have diabetes mellitus.  You are an elderly adult, especially if you are also in a long-term care facility.  You are pregnant and in the first trimester.  You have kidney stones.  You are female.  You have had a kidney transplant.  You have a leaky kidney tube valve (reflux).  You had a urinary catheter for a long period of time. What are the signs or symptoms? There are no symptoms of this condition. How is this diagnosed? This condition is diagnosed with a urine test. Because this condition does not cause symptoms, it is usually diagnosed when a urine sample is taken to treat or diagnose another condition, such as pregnancy or kidney problems. Most women who are in their first trimester of pregnancy are screened for asymptomatic bacteriuria. How is this treated? Usually, treatment is not needed for this condition. Treating the condition can lead to other problems, such as a yeast infection or the growth of bacteria that do not respond to treatment (antibiotic-resistant bacteria). Some people, such as pregnant women and people with kidney transplants, do need treatment with antibiotic medicines to prevent kidney infection (pyelonephritis). In pregnant women, kidney infection can lead to premature labor, fetal growth restriction, or newborn death. Follow these instructions at home: Medicines  Take over-the-counter and prescription  medicines only as told by your health care provider.  If you were prescribed an antibiotic medicine, take it as told by your health care provider. Do not stop taking the antibiotic even if you start to feel better. General instructions  Monitor your condition for any changes.  Drink enough fluid to keep your urine clear or pale yellow.  Go to the bathroom more often to keep your bladder empty.  If you are female, keep the area around your vagina and rectum clean. Wipe yourself from front to back after urinating.  Keep all follow-up visits as told by your health care provider. This is important. Contact a health care provider if:  You notice any new symptoms, such as back pain or burning while urinating. Get help right away if:  You develop signs of an infection such as: ? A burning sensation when you urinate. ? Have pain when you urinate. ? Develop an intense need to urinate. ? Urinating more frequently. ? Back pain or pelvic pain. ? Fever or chills.  You have blood in your urine.  Your urine becomes discolored or cloudy.  Your urine smells bad.  You have severe pain that cannot be controlled with medicine. Summary  Asymptomatic bacteriuria is the presence of a large number of bacteria in the urine without the usual symptoms of burning or frequent urination.  Usually, treatment is not needed for this condition. Treating the condition can lead to other problems, such as too much yeast and the growth of antibiotic-resistant bacteria.  Some people, such as pregnant women and people with kidney transplants, do need treatment with antibiotic medicines to prevent kidney   infection (pyelonephritis).  If you were prescribed an antibiotic medicine, take it as told by your health care provider. Do not stop taking the antibiotic even if you start to feel better. This information is not intended to replace advice given to you by your health care provider. Make sure you discuss any  questions you have with your health care provider. Document Released: 12/16/2005 Document Revised: 12/10/2016 Document Reviewed: 12/10/2016 Elsevier Interactive Patient Education  2019 Elsevier Inc.  

## 2019-03-07 LAB — URINE CULTURE

## 2019-03-09 ENCOUNTER — Telehealth (INDEPENDENT_AMBULATORY_CARE_PROVIDER_SITE_OTHER): Payer: Self-pay

## 2019-03-09 DIAGNOSIS — R300 Dysuria: Secondary | ICD-10-CM

## 2019-03-09 NOTE — Telephone Encounter (Signed)
-----   Message from Grayce Sessions, NP sent at 03/08/2019 11:11 AM EDT ----- Plz have pt to return for another urine sample

## 2019-03-09 NOTE — Telephone Encounter (Signed)
Patient aware we need another urine specimen. She will come in on 03/10/2019 to give specimen. Maryjean Morn, CMA

## 2019-03-10 ENCOUNTER — Other Ambulatory Visit (INDEPENDENT_AMBULATORY_CARE_PROVIDER_SITE_OTHER): Payer: Self-pay | Admitting: Primary Care

## 2019-03-10 DIAGNOSIS — R300 Dysuria: Secondary | ICD-10-CM

## 2019-03-10 NOTE — Addendum Note (Signed)
Addended by: Maryjean Morn on: 03/10/2019 04:18 PM   Modules accepted: Orders

## 2019-03-10 NOTE — Progress Notes (Signed)
Established Patient Office Visit  Subjective:  Patient ID: Stephanie Thornton, female    DOB: November 03, 1971  Age: 48 y.o. MRN: [id number]  CC: No chief complaint on file.   HPI Stephanie Thornton presents for painful urination. Will get a U/A .  Past Medical History:  Diagnosis Date  . Anxiety   . Arthritis    "knees, feet, back" (05/24/2015)  . Benign essential HTN 05/01/2015  . Bipolar disorder (HCC) diagnosed early 7190s  . Chronic bronchitis (HCC)    "get it q yr"  . Chronic lower back pain   . Depression   . Diabetes mellitus type II 2010  . GERD (gastroesophageal reflux disease)   . Headache    "maybe 3 times/wk" (05/24/2015)  . History of hiatal hernia   . History of stomach ulcers   . Menopause   . Migraine    "1-2/wk" (05/24/2015)  . OSA on CPAP     Past Surgical History:  Procedure Laterality Date  . ABDOMINAL HYSTERECTOMY  2007  . BUNIONECTOMY Bilateral   . CARPAL TUNNEL RELEASE Right   . HAMMER TOE SURGERY Left   . LEFT HEART CATH AND CORONARY ANGIOGRAPHY N/A 05/20/2018   Procedure: LEFT HEART CATH AND CORONARY ANGIOGRAPHY;  Surgeon: Marykay LexHarding, David W, MD;  [facility];  Service: Cardiovascular;  Laterality: N/A;  . TONSILLECTOMY Bilateral 05/24/2015   Procedure: TONSILLECTOMY;  Surgeon: Christia Readingwight Bates, MD;  [facility];  Service: ENT;  Laterality: Bilateral;    Family History  Problem Relation Age of Onset  . Bipolar disorder Mother   . CVA Mother   . Bipolar disorder Son   . Depression Brother        schizoaffective d/o  . Hypertension Sister   . Heart disease Maternal Aunt   . Heart attack Maternal Aunt   . Hypertension Brother     Social History   Socioeconomic History  . Marital status: Divorced    Spouse name: Not on file  . Number of children: 3  . Years of education: Not on file  . Highest education level: Not on file  Occupational History  . Not on file  Social Needs  . Financial resource strain: Not on file  . Food insecurity:     Worry: Not on file    Inability: Not on file  . Transportation needs:    Medical: Not on file    Non-medical: Not on file  Tobacco Use  . Smoking status: Current Some Day Smoker    Packs/day: 1.00    Years: 30.00    Pack years: 30.00    Types: Cigarettes  . Smokeless tobacco: Never Used  Substance and Sexual Activity  . Alcohol use: No  . Drug use: No  . Sexual activity: Yes  Lifestyle  . Physical activity:    Days per week: Not on file    Minutes per session: Not on file  . Stress: Not on file  Relationships  . Social connections:    Talks on phone: Not on file    Gets together: Not on file    Attends religious service: Not on file    Active member of club or organization: Not on file    Attends meetings of clubs or organizations: Not on file    Relationship status: Not on file  . Intimate partner violence:    Fear of current or ex partner: Not on file    Emotionally abused: Not on file  Physically abused: Not on file    Forced sexual activity: Not on file  Other Topics Concern  . Not on file  Social History Narrative  . Not on file    Outpatient Medications Prior to Visit  Medication Sig Dispense Refill  . albuterol (PROVENTIL HFA;VENTOLIN HFA) 108 (90 Base) MCG/ACT inhaler Inhale 1-2 puffs into the lungs every 6 (six) hours as needed for wheezing or shortness of breath. 1 Inhaler 2  . amitriptyline (ELAVIL) 150 MG tablet Take 150 mg by mouth daily.    Marland Kitchen aspirin 81 MG EC tablet Take 1 tablet (81 mg total) by mouth daily. 90 tablet 3  . busPIRone (BUSPAR) 10 MG tablet Take 10 mg by mouth 3 (three) times daily.    Marland Kitchen gabapentin (NEURONTIN) 300 MG capsule Take 1 capsule (300 mg total) by mouth 3 (three) times daily. 270 capsule 1  . insulin glargine (LANTUS) 100 UNIT/ML injection Use 40 units at bedtime and 40 units 12 hours after bedtime dose. 10 mL 5  . Insulin Syringe-Needle U-100 (INSULIN SYRINGE 1CC/30GX1/2") 30G X 1/2" 1 ML MISC Inject 1 application into the  skin 5 (five) times daily. 150 each 5  . LATUDA 80 MG TABS tablet Take 80 mg by mouth daily.    Marland Kitchen lovastatin (MEVACOR) 40 MG tablet Take 1 tablet (40 mg total) by mouth at bedtime. 90 tablet 1  . Multiple Vitamin (MULTI-VITAMINS) TABS Take 1 tablet by mouth daily. 30 tablet 2  . Olmesartan-amLODIPine-HCTZ 40-10-25 MG TABS Take 1 tablet by mouth every evening. 90 tablet 1  . oxyCODONE (ROXICODONE) 15 MG immediate release tablet Take 15 mg by mouth 4 (four) times daily as needed.    . sitaGLIPtin-metformin (JANUMET) 50-1000 MG tablet Take 1 tablet by mouth 2 (two) times daily with a meal. 180 tablet 1  . topiramate (TOPAMAX) 200 MG tablet Take 200 mg by mouth daily.    . traZODone (DESYREL) 100 MG tablet Take 100 mg by mouth daily.    . ciprofloxacin (CIPRO) 500 MG tablet Take 1 tablet (500 mg total) by mouth 2 (two) times daily. 6 tablet 0  . fluconazole (DIFLUCAN) 150 MG tablet Take 1 tablet (150 mg total) by mouth 2 (two) times daily. 2 tablet 0  . insulin aspart (NOVOLOG) 100 UNIT/ML injection If sugar 150-200 take 2 units If sugar 201-251 take 4 units If sugar 251-300 take 6 units If sugar 301-350 take 8 units If sugar 351-400 take 10 units 10 mL 11   No facility-administered medications prior to visit.     Allergies  Allergen Reactions  . Lyrica [Pregabalin] Shortness Of Breath and Rash  . Rocephin [Ceftriaxone Sodium In Dextrose] Shortness Of Breath  . Tramadol Shortness Of Breath    Had asthma attack  . Vancomycin Anaphylaxis    Per Dr. Flonnie Overman  . Glipizide Hives  . Keflex [Cephalexin] Hives  . Penicillins Hives    Has patient had a PCN reaction causing immediate rash, facial/tongue/throat swelling, SOB or lightheadedness with hypotension: YES Has patient had a PCN reaction causing severe rash involving mucus membranes or skin necrosis: NO Has patient had a PCN reaction that required hospitalization: YES Has patient had a PCN reaction occurring within the last 10 years: YES If  all of the above answers are "NO", then may proceed with Cephalosporin use.  . Sulfa Antibiotics Hives  . Latex Rash    Generally with clear tape, bandaides  . Sitagliptin-Metformin Hcl Nausea Only    ROS Review  of Systems  Constitutional: Negative.   HENT: Negative.   Respiratory: Negative.   Genitourinary: Positive for difficulty urinating.  Skin: Negative.   Neurological: Negative.   Psychiatric/Behavioral: Negative.       Objective:    Physical Exam  Constitutional: She appears well-developed and well-nourished.  HENT:  Head: Normocephalic and atraumatic.  Cardiovascular: Normal rate and regular rhythm.  Pulmonary/Chest: Effort normal and breath sounds normal.  Abdominal: Soft. Bowel sounds are normal.  Psychiatric: She has a normal mood and affect.    There were no vitals taken for this visit. Wt Readings from Last 3 Encounters:  03/18/19 174 lb (78.9 kg)  03/04/19 181 lb (82.1 kg)  12/15/18 174 lb 3.2 oz (79 kg)     Health Maintenance Due  Topic Date Due  . OPHTHALMOLOGY EXAM  05/22/1982  . PAP SMEAR-Modifier  05/22/1993    There are no preventive care reminders to display for this patient.  Lab Results  Component Value Date   TSH 0.334 (L) 05/19/2018   Lab Results  Component Value Date   WBC 11.7 (H) 05/20/2018   HGB 11.1 (L) 05/20/2018   HCT 35.2 (L) 05/20/2018   MCV 73.6 (L) 05/20/2018   PLT 220 05/20/2018   Lab Results  Component Value Date   NA 137 12/15/2018   K 3.6 12/15/2018   CO2 18 (L) 12/15/2018   GLUCOSE 383 (H) 12/15/2018   BUN 6 12/15/2018   CREATININE 0.79 12/15/2018   BILITOT <0.2 12/15/2018   ALKPHOS 127 (H) 12/15/2018   AST 10 12/15/2018   ALT 13 12/15/2018   PROT 6.6 12/15/2018   ALBUMIN 3.9 12/15/2018   CALCIUM 8.8 12/15/2018   ANIONGAP 9 05/20/2018   Lab Results  Component Value Date   CHOL 184 12/15/2018   Lab Results  Component Value Date   HDL 44 12/15/2018   Lab Results  Component Value Date   LDLCALC  109 (H) 12/15/2018   Lab Results  Component Value Date   TRIG 157 (H) 12/15/2018   Lab Results  Component Value Date   CHOLHDL 4.2 12/15/2018   Lab Results  Component Value Date   HGBA1C 12.9 (A) 03/18/2019      Assessment & Plan:   Problem List Items Addressed This Visit    None    Visit Diagnoses    Dysuria    -  Primary pcot positive will send out for culture and sensitivity       No orders of the defined types were placed in this encounter.   Follow-up: No follow-ups on file.    Grayce Sessions, NP

## 2019-03-12 ENCOUNTER — Other Ambulatory Visit (INDEPENDENT_AMBULATORY_CARE_PROVIDER_SITE_OTHER): Payer: Self-pay | Admitting: Primary Care

## 2019-03-12 LAB — URINE CULTURE

## 2019-03-12 MED ORDER — TETRACYCLINE HCL 500 MG PO CAPS: 500.0000 mg | ORAL_CAPSULE | Freq: Four times a day (QID) | ORAL | 0 refills | Status: DC

## 2019-03-12 NOTE — Progress Notes (Signed)
UA results

## 2019-03-15 ENCOUNTER — Telehealth (INDEPENDENT_AMBULATORY_CARE_PROVIDER_SITE_OTHER): Payer: Self-pay

## 2019-03-15 NOTE — Telephone Encounter (Signed)
Patient is aware that urine culture was positive for multiple bacteria. Abx has been sent to pharmacy, but is still waiting for prior authorization. Maryjean Morn, CMA

## 2019-03-15 NOTE — Telephone Encounter (Signed)
-----   Message from Grayce Sessions, NP sent at 03/12/2019  4:09 PM EDT ----- Cultures ++ for multiple bacteria abts sent

## 2019-03-17 ENCOUNTER — Telehealth: Payer: Self-pay

## 2019-03-17 NOTE — Telephone Encounter (Signed)
I was informed by [facility] that this patients Tetracycline required a PA, I submitted a PA but there was no clinical reasoning documented as to why the patient needs this over the preferred options Doxycycline or Minocycline I expect a response by tomorrow but if either alternative would be an acceptable alternative I would advise sending it now.

## 2019-03-17 NOTE — Telephone Encounter (Signed)
Patient was tested for a UTI multiple bacteria grew culture and sensitivity done and tetracycline was one of the medication the organism is susceptible to.

## 2019-03-18 ENCOUNTER — Encounter (INDEPENDENT_AMBULATORY_CARE_PROVIDER_SITE_OTHER): Payer: Self-pay | Admitting: Primary Care

## 2019-03-18 ENCOUNTER — Ambulatory Visit (INDEPENDENT_AMBULATORY_CARE_PROVIDER_SITE_OTHER): Payer: Medicare Other | Admitting: Primary Care

## 2019-03-18 ENCOUNTER — Other Ambulatory Visit: Payer: Self-pay

## 2019-03-18 VITALS — BP 112/75 | HR 88 | Temp 97.8°F | Ht 62.0 in | Wt 174.0 lb

## 2019-03-18 DIAGNOSIS — F3112 Bipolar disorder, current episode manic without psychotic features, moderate: Secondary | ICD-10-CM

## 2019-03-18 DIAGNOSIS — F321 Major depressive disorder, single episode, moderate: Secondary | ICD-10-CM | POA: Diagnosis not present

## 2019-03-18 DIAGNOSIS — Z794 Long term (current) use of insulin: Secondary | ICD-10-CM | POA: Diagnosis not present

## 2019-03-18 DIAGNOSIS — I10 Essential (primary) hypertension: Secondary | ICD-10-CM | POA: Diagnosis not present

## 2019-03-18 DIAGNOSIS — E1142 Type 2 diabetes mellitus with diabetic polyneuropathy: Secondary | ICD-10-CM

## 2019-03-18 LAB — POCT GLYCOSYLATED HEMOGLOBIN (HGB A1C): Hemoglobin A1C: 12.9 % — AB (ref 4.0–5.6)

## 2019-03-18 LAB — POCT CBG (FASTING - GLUCOSE)-MANUAL ENTRY: Glucose Fasting, POC: 291 mg/dL — AB (ref 70–99)

## 2019-03-18 MED ORDER — INSULIN REGULAR HUMAN 100 UNIT/ML IJ SOLN: 10.0000 [IU] | Freq: Three times a day (TID) | INTRAMUSCULAR | 11 refills | Status: DC

## 2019-03-18 NOTE — Patient Instructions (Signed)
Diabetes Basics    Diabetes (diabetes mellitus) is a long-term (chronic) disease. It occurs when the body does not properly use sugar (glucose) that is released from food after you eat.  Diabetes may be caused by one or both of these problems:  · Your pancreas does not make enough of a hormone called insulin.  · Your body does not react in a normal way to insulin that it makes.  Insulin lets sugars (glucose) go into cells in your body. This gives you energy. If you have diabetes, sugars cannot get into cells. This causes high blood sugar (hyperglycemia).  Follow these instructions at home:  How is diabetes treated?  You may need to take insulin or other diabetes medicines daily to keep your blood sugar in balance. Take your diabetes medicines every day as told by your doctor. List your diabetes medicines here:  Diabetes medicines  · Name of medicine: ______________________________  ? Amount (dose): _______________ Time (a.m./p.m.): _______________ Notes: ___________________________________  · Name of medicine: ______________________________  ? Amount (dose): _______________ Time (a.m./p.m.): _______________ Notes: ___________________________________  · Name of medicine: ______________________________  ? Amount (dose): _______________ Time (a.m./p.m.): _______________ Notes: ___________________________________  If you use insulin, you will learn how to give yourself insulin by injection. You may need to adjust the amount based on the food that you eat. List the types of insulin you use here:  Insulin  · Insulin type: ______________________________  ? Amount (dose): _______________ Time (a.m./p.m.): _______________ Notes: ___________________________________  · Insulin type: ______________________________  ? Amount (dose): _______________ Time (a.m./p.m.): _______________ Notes: ___________________________________  · Insulin type: ______________________________  ? Amount (dose): _______________ Time (a.m./p.m.):  _______________ Notes: ___________________________________  · Insulin type: ______________________________  ? Amount (dose): _______________ Time (a.m./p.m.): _______________ Notes: ___________________________________  · Insulin type: ______________________________  ? Amount (dose): _______________ Time (a.m./p.m.): _______________ Notes: ___________________________________  How do I manage my blood sugar?    Check your blood sugar levels using a blood glucose monitor as directed by your doctor.  Your doctor will set treatment goals for you. Generally, you should have these blood sugar levels:  · Before meals (preprandial): 80-130 mg/dL (4.4-7.2 mmol/L).  · After meals (postprandial): below 180 mg/dL (10 mmol/L).  · A1c level: less than 7%.  Write down the times that you will check your blood sugar levels:  Blood sugar checks  · Time: _______________ Notes: ___________________________________  · Time: _______________ Notes: ___________________________________  · Time: _______________ Notes: ___________________________________  · Time: _______________ Notes: ___________________________________  · Time: _______________ Notes: ___________________________________  · Time: _______________ Notes: ___________________________________    What do I need to know about low blood sugar?  Low blood sugar is called hypoglycemia. This is when blood sugar is at or below 70 mg/dL (3.9 mmol/L). Symptoms may include:  · Feeling:  ? Hungry.  ? Worried or nervous (anxious).  ? Sweaty and clammy.  ? Confused.  ? Dizzy.  ? Sleepy.  ? Sick to your stomach (nauseous).  · Having:  ? A fast heartbeat.  ? A headache.  ? A change in your vision.  ? Tingling or no feeling (numbness) around the mouth, lips, or tongue.  ? Jerky movements that you cannot control (seizure).  · Having trouble with:  ? Moving (coordination).  ? Sleeping.  ? Passing out (fainting).  ? Getting upset easily (irritability).  Treating low blood sugar  To treat low blood  sugar, eat or drink something sugary right away. If you can think clearly and swallow safely, follow the 15:15   rule:  · Take 15 grams of a fast-acting carb (carbohydrate). Talk with your doctor about how much you should take.  · Some fast-acting carbs are:  ? Sugar tablets (glucose pills). Take 3-4 glucose pills.  ? 6-8 pieces of hard candy.  ? 4-6 oz (120-150 mL) of fruit juice.  ? 4-6 oz (120-150 mL) of regular (not diet) soda.  ? 1 Tbsp (15 mL) honey or sugar.  · Check your blood sugar 15 minutes after you take the carb.  · If your blood sugar is still at or below 70 mg/dL (3.9 mmol/L), take 15 grams of a carb again.  · If your blood sugar does not go above 70 mg/dL (3.9 mmol/L) after 3 tries, get help right away.  · After your blood sugar goes back to normal, eat a meal or a snack within 1 hour.  Treating very low blood sugar  If your blood sugar is at or below 54 mg/dL (3 mmol/L), you have very low blood sugar (severe hypoglycemia). This is an emergency. Do not wait to see if the symptoms will go away. Get medical help right away. Call your local emergency services (911 in the [place] Do not drive yourself to the hospital.  Questions to ask your health care provider  · Do I need to meet with a diabetes educator?  · What equipment will I need to care for myself at home?  · What diabetes medicines do I need? When should I take them?  · How often do I need to check my blood sugar?  · What number can I call if I have questions?  · When is my next doctor's visit?  · Where can I find a support group for people with diabetes?  Where to find more information  · American Diabetes Association: www.diabetes.org  · American Association of Diabetes Educators: www.diabeteseducator.org/patient-resources  Contact a doctor if:  · Your blood sugar is at or above 240 mg/dL (13.3 mmol/L) for 2 days in a row.  · You have been sick or have had a fever for 2 days or more, and you are not getting better.  · You have any of these  problems for more than 6 hours:  ? You cannot eat or drink.  ? You feel sick to your stomach (nauseous).  ? You throw up (vomit).  ? You have watery poop (diarrhea).  Get help right away if:  · Your blood sugar is lower than 54 mg/dL (3 mmol/L).  · You get confused.  · You have trouble:  ? Thinking clearly.  ? Breathing.  Summary  · Diabetes (diabetes mellitus) is a long-term (chronic) disease. It occurs when the body does not properly use sugar (glucose) that is released from food after digestion.  · Take insulin and diabetes medicines as told.  · Check your blood sugar every day, as often as told.  · Keep all follow-up visits as told by your doctor. This is important.  This information is not intended to replace advice given to you by your health care provider. Make sure you discuss any questions you have with your health care provider.  Document Released: 03/20/2018 Document Revised: 06/08/2018 Document Reviewed: 03/20/2018  Elsevier Interactive Patient Education © 2019 [employer]

## 2019-03-18 NOTE — Progress Notes (Signed)
Established Patient Office Visit  Subjective:  Patient ID: Stephanie Thornton, female    DOB: [birth year]  Age: 48 y.o. MRN: [id number]  CC: No chief complaint on file.   HPI SOSHA EMSLEY presents for diabetes management. A1C today 12.9 which is an increase from 12.7. Patient states taking diabetes medication as instructed with these reading 200> and a elevated A1C will adjust medication and refer to endocrinology.  Past Medical History:  Diagnosis Date  . Anxiety   . Arthritis    "knees, feet, back" (05/24/2015)  . Benign essential HTN 05/01/2015  . Bipolar disorder (HCC) diagnosed early 2s  . Chronic bronchitis (HCC)    "get it q yr"  . Chronic lower back pain   . Depression   . Diabetes mellitus type II 2010  . GERD (gastroesophageal reflux disease)   . Headache    "maybe 3 times/wk" (05/24/2015)  . History of hiatal hernia   . History of stomach ulcers   . Menopause   . Migraine    "1-2/wk" (05/24/2015)  . OSA on CPAP     Past Surgical History:  Procedure Laterality Date  . ABDOMINAL HYSTERECTOMY  2007  . BUNIONECTOMY Bilateral   . CARPAL TUNNEL RELEASE Right   . HAMMER TOE SURGERY Left   . LEFT HEART CATH AND CORONARY ANGIOGRAPHY N/A 05/20/2018   Procedure: LEFT HEART CATH AND CORONARY ANGIOGRAPHY;  Surgeon: Marykay Lex, MD;  Location: [facility] INVASIVE CV LAB;  Service: Cardiovascular;  Laterality: N/A;  . TONSILLECTOMY Bilateral 05/24/2015   Procedure: TONSILLECTOMY;  Surgeon: Christia Reading, MD;  Location: [facility] OR;  Service: ENT;  Laterality: Bilateral;    Family History  Problem Relation Age of Onset  . Bipolar disorder Mother   . CVA Mother   . Bipolar disorder Son   . Depression Brother        schizoaffective d/o  . Hypertension Sister   . Heart disease Maternal Aunt   . Heart attack Maternal Aunt   . Hypertension Brother     Social History   Socioeconomic History  . Marital status: Divorced    Spouse name: Not on file  . Number of children: 3  . Years of  education: Not on file  . Highest education level: Not on file  Occupational History  . Not on file  Social Needs  . Financial resource strain: Not on file  . Food insecurity:    Worry: Not on file    Inability: Not on file  . Transportation needs:    Medical: Not on file    Non-medical: Not on file  Tobacco Use  . Smoking status: Current Some Day Smoker    Packs/day: 1.00    Years: 30.00    Pack years: 30.00    Types: Cigarettes  . Smokeless tobacco: Never Used  Substance and Sexual Activity  . Alcohol use: No  . Drug use: No  . Sexual activity: Yes  Lifestyle  . Physical activity:    Days per week: Not on file    Minutes per session: Not on file  . Stress: Not on file  Relationships  . Social connections:    Talks on phone: Not on file    Gets together: Not on file    Attends religious service: Not on file    Active member of club or organization: Not on file    Attends meetings of clubs or organizations: Not on file    Relationship status: Not on file  .  Intimate partner violence:    Fear of current or ex partner: Not on file    Emotionally abused: Not on file    Physically abused: Not on file    Forced sexual activity: Not on file  Other Topics Concern  . Not on file  Social History Narrative  . Not on file    Outpatient Medications Prior to Visit  Medication Sig Dispense Refill  . albuterol (PROVENTIL HFA;VENTOLIN HFA) 108 (90 Base) MCG/ACT inhaler Inhale 1-2 puffs into the lungs every 6 (six) hours as needed for wheezing or shortness of breath. 1 Inhaler 2  . amitriptyline (ELAVIL) 150 MG tablet Take 150 mg by mouth daily.    Marland Kitchen. aspirin 81 MG EC tablet Take 1 tablet (81 mg total) by mouth daily. 90 tablet 3  . BD PEN NEEDLE NANO U/F 32G X 4 MM MISC     . busPIRone (BUSPAR) 10 MG tablet Take 10 mg by mouth 3 (three) times daily.    Marland Kitchen. gabapentin (NEURONTIN) 300 MG capsule Take 1 capsule (300 mg total) by mouth 3 (three) times daily. 270 capsule 1  . insulin  glargine (LANTUS) 100 UNIT/ML injection Use 40 units at bedtime and 40 units 12 hours after bedtime dose. 10 mL 5  . Insulin Syringe-Needle U-100 (INSULIN SYRINGE 1CC/30GX1/2") 30G X 1/2" 1 ML MISC Inject 1 application into the skin 5 (five) times daily. 150 each 5  . LATUDA 80 MG TABS tablet Take 80 mg by mouth daily.    Marland Kitchen. lovastatin (MEVACOR) 40 MG tablet Take 1 tablet (40 mg total) by mouth at bedtime. 90 tablet 1  . Multiple Vitamin (MULTI-VITAMINS) TABS Take 1 tablet by mouth daily. 30 tablet 2  . Olmesartan-amLODIPine-HCTZ 40-10-25 MG TABS Take 1 tablet by mouth every evening. 90 tablet 1  . oxyCODONE (ROXICODONE) 15 MG immediate release tablet Take 15 mg by mouth 4 (four) times daily as needed.    . sitaGLIPtin-metformin (JANUMET) 50-1000 MG tablet Take 1 tablet by mouth 2 (two) times daily with a meal. 180 tablet 1  . topiramate (TOPAMAX) 200 MG tablet Take 200 mg by mouth daily.    . traZODone (DESYREL) 100 MG tablet Take 100 mg by mouth daily.    . insulin aspart (NOVOLOG) 100 UNIT/ML injection If sugar 150-200 take 2 units If sugar 201-251 take 4 units If sugar 251-300 take 6 units If sugar 301-350 take 8 units If sugar 351-400 take 10 units 10 mL 11  . tetracycline (ACHROMYCIN,SUMYCIN) 500 MG capsule Take 1 capsule (500 mg total) by mouth 4 (four) times daily for 10 days. 40 capsule 0  . ciprofloxacin (CIPRO) 500 MG tablet Take 1 tablet (500 mg total) by mouth 2 (two) times daily. 6 tablet 0  . fluconazole (DIFLUCAN) 150 MG tablet Take 1 tablet (150 mg total) by mouth 2 (two) times daily. 2 tablet 0   No facility-administered medications prior to visit.     Allergies  Allergen Reactions  . Lyrica [Pregabalin] Shortness Of Breath and Rash  . Rocephin [Ceftriaxone Sodium In Dextrose] Shortness Of Breath  . Tramadol Shortness Of Breath    Had asthma attack  . Vancomycin Anaphylaxis    Per Dr. Flonnie Overmanezai  . Glipizide Hives  . Keflex [Cephalexin] Hives  . Penicillins Hives    Has  patient had a PCN reaction causing immediate rash, facial/tongue/throat swelling, SOB or lightheadedness with hypotension: YES Has patient had a PCN reaction causing severe rash involving mucus membranes or skin necrosis:  NO Has patient had a PCN reaction that required hospitalization: YES Has patient had a PCN reaction occurring within the last 10 years: YES If all of the above answers are "NO", then may proceed with Cephalosporin use.  . Sulfa Antibiotics Hives  . Latex Rash    Generally with clear tape, bandaides  . Sitagliptin-Metformin Hcl Nausea Only    ROS Review of Systems  Constitutional: Negative.   HENT: Negative.   Eyes: Negative.   Respiratory: Negative.   Cardiovascular: Negative.   Gastrointestinal: Positive for abdominal distention.  Endocrine: Negative.   Genitourinary: Negative.   Musculoskeletal: Positive for arthralgias.  Skin: Positive for rash.  Allergic/Immunologic: Negative.   Neurological: Negative.   Hematological: Negative.   Psychiatric/Behavioral: Negative.       Objective:    Physical Exam  Constitutional: She is oriented to person, place, and time. She appears well-developed and well-nourished.  HENT:  Head: Normocephalic and atraumatic.  Eyes: Pupils are equal, round, and reactive to light. EOM are normal.  Neck: Normal range of motion. Neck supple.  Cardiovascular: Normal rate and regular rhythm.  Pulmonary/Chest: Effort normal and breath sounds normal.  Abdominal: Bowel sounds are normal.  Musculoskeletal: Normal range of motion.  Neurological: She is alert and oriented to person, place, and time.  Skin: Skin is warm.  Rash bilateral rash mimic eczema and psoriasis   Psychiatric: She has a normal mood and affect.    BP 112/75 (BP Location: Left Arm, Patient Position: Sitting, Cuff Size: Normal)   Pulse 88   Temp 97.8 F (36.6 C) (Oral)   Ht  (1.575 m)   Wt 174 lb (78.9 kg)   SpO2 100%   BMI 31.83 kg/m  Wt Readings from  Last 3 Encounters:  03/18/19 174 lb (78.9 kg)  03/04/19 181 lb (82.1 kg)  12/15/18 174 lb 3.2 oz (79 kg)     Health Maintenance Due  Topic Date Due  . OPHTHALMOLOGY EXAM  05/22/1982  . PAP SMEAR-Modifier  05/22/1993    There are no preventive care reminders to display for this patient.  Lab Results  Component Value Date   TSH 0.334 (L) 05/19/2018   Lab Results  Component Value Date   WBC 11.7 (H) 05/20/2018   HGB 11.1 (L) 05/20/2018   HCT 35.2 (L) 05/20/2018   MCV 73.6 (L) 05/20/2018   PLT 220 05/20/2018   Lab Results  Component Value Date   NA 137 12/15/2018   K 3.6 12/15/2018   CO2 18 (L) 12/15/2018   GLUCOSE 383 (H) 12/15/2018   BUN 6 12/15/2018   CREATININE 0.79 12/15/2018   BILITOT <0.2 12/15/2018   ALKPHOS 127 (H) 12/15/2018   AST 10 12/15/2018   ALT 13 12/15/2018   PROT 6.6 12/15/2018   ALBUMIN 3.9 12/15/2018   CALCIUM 8.8 12/15/2018   ANIONGAP 9 05/20/2018   Lab Results  Component Value Date   CHOL 184 12/15/2018   Lab Results  Component Value Date   HDL 44 12/15/2018   Lab Results  Component Value Date   LDLCALC 109 (H) 12/15/2018   Lab Results  Component Value Date   TRIG 157 (H) 12/15/2018   Lab Results  Component Value Date   CHOLHDL 4.2 12/15/2018   Lab Results  Component Value Date   HGBA1C 12.9 (A) 03/18/2019      Assessment & Plan:  Diagnoses and all orders for this visit:  Type 2 diabetes mellitus with diabetic polyneuropathy, with long-term current use of  insulin (HCC) A1C 12.9 today and 12/19 12.7. Non compliant . Currently on Lantus and SS Novolin regular. Discussed diet modification and excercise -     HgB A1c -     Glucose (CBG), Fasting -     Ambulatory referral to Ophthalmology  Current moderate episode of major depressive disorder, unspecified whether recurrent (HCC) and Bipolar affective disorder, currently manic, moderate (HCC) Seen outside of practice on Buspar , trazodone and Lutuda  Hypertension, unspefied  type Well controlled on Olmesartan-amlodipine-HCTZ (40-10-25) Other orders -     insulin regular (HUMULIN R) 100 units/mL injection; Inject 0.1 mLs (10 Units total) into the skin 3 (three) times daily before meals for 30 days.      Problem List Items Addressed This Visit    Bipolar disorder (HCC)   DM (diabetes mellitus) (HCC) - Primary   Relevant Orders   HgB A1c (Completed)   Glucose (CBG), Fasting (Completed)   Ambulatory referral to Ophthalmology   Depression    Other Visit Diagnoses    Hypertension, unspecified type          No orders of the defined types were placed in this encounter.   Follow-up:  3 months for uncontrolled DM  Grayce Sessions, NP

## 2019-03-22 ENCOUNTER — Other Ambulatory Visit: Payer: Self-pay | Admitting: Primary Care

## 2019-03-22 DIAGNOSIS — E0800 Diabetes mellitus due to underlying condition with hyperosmolarity without nonketotic hyperglycemic-hyperosmolar coma (NKHHC): Secondary | ICD-10-CM

## 2019-03-22 MED ORDER — DOXYCYCLINE HYCLATE 100 MG PO TABS: 100.0000 mg | ORAL_TABLET | Freq: Two times a day (BID) | ORAL | 0 refills | Status: DC

## 2019-03-22 MED ORDER — INSULIN REGULAR HUMAN 100 UNIT/ML IJ SOLN: 10.0000 [IU] | Freq: Three times a day (TID) | INTRAMUSCULAR | Status: DC

## 2019-03-22 MED ORDER — DOXYCYCLINE HYCLATE 100 MG PO TABS: 100.0000 mg | ORAL_TABLET | Freq: Two times a day (BID) | ORAL | 0 refills | Status: AC

## 2019-03-23 NOTE — Telephone Encounter (Signed)
PA was denied, provider was made aware.

## 2019-03-25 ENCOUNTER — Other Ambulatory Visit: Payer: Self-pay | Admitting: Primary Care

## 2019-05-17 ENCOUNTER — Telehealth: Payer: Self-pay

## 2019-05-17 NOTE — Telephone Encounter (Signed)
Call placed to patient in attempt to determine who ordered her CPAP machine. She said that she's had the CPAP for many years and she is not sure who ordered it.  She also said that she might even need a new sleep study.  Multiple attempts made to [facility] to confirm who ordered the CPAP machine and supplies.  The calls were either dropped or and without being answered.

## 2019-05-18 ENCOUNTER — Telehealth: Payer: Self-pay

## 2019-05-18 NOTE — Telephone Encounter (Signed)
Attempted to contact [facility] to clarify information needed for CPAP order. Voicemail message left requesting a call back to this CM # [phone]

## 2019-05-26 ENCOUNTER — Telehealth: Payer: Self-pay

## 2019-05-26 NOTE — Telephone Encounter (Signed)
Attempted again to contact [facility] # [phone] to clarify information needed for CPAP order. Voicemail message left requesting a call back to this CM # [phone]

## 2019-05-28 ENCOUNTER — Other Ambulatory Visit: Payer: Self-pay

## 2019-05-28 ENCOUNTER — Telehealth: Payer: Self-pay | Admitting: Primary Care

## 2019-05-28 ENCOUNTER — Ambulatory Visit: Payer: Medicare Other | Attending: Primary Care | Admitting: Primary Care

## 2019-05-28 ENCOUNTER — Encounter: Payer: Self-pay | Admitting: Primary Care

## 2019-05-28 DIAGNOSIS — I10 Essential (primary) hypertension: Secondary | ICD-10-CM

## 2019-05-28 DIAGNOSIS — E1142 Type 2 diabetes mellitus with diabetic polyneuropathy: Secondary | ICD-10-CM | POA: Diagnosis not present

## 2019-05-28 DIAGNOSIS — Z7901 Long term (current) use of anticoagulants: Secondary | ICD-10-CM | POA: Insufficient documentation

## 2019-05-28 DIAGNOSIS — Z794 Long term (current) use of insulin: Secondary | ICD-10-CM

## 2019-05-28 DIAGNOSIS — R140 Abdominal distension (gaseous): Secondary | ICD-10-CM | POA: Diagnosis not present

## 2019-05-28 DIAGNOSIS — K5900 Constipation, unspecified: Secondary | ICD-10-CM | POA: Diagnosis present

## 2019-05-28 MED ORDER — LUBIPROSTONE 24 MCG PO CAPS: 24.0000 ug | ORAL_CAPSULE | Freq: Two times a day (BID) | ORAL | 2 refills | Status: DC

## 2019-05-28 NOTE — Progress Notes (Signed)
Per pt she's not able to use the restroom. Per pt she is going a week or so with out using the restroom, per pt her left lower back is hurting. Per pt she tried every over the counter laxatives and nothing works.   Per pt her sugar at home was 182 after she ate.

## 2019-05-28 NOTE — Telephone Encounter (Signed)
Patient called stating she was told by the pharmacy that the prescription is on hold due to additional information being needed. Please follow up

## 2019-05-28 NOTE — Progress Notes (Signed)
Virtual Visit via Telephone Note  I connected with Stephanie Thornton on 05/30/19 at 11:10 AM EDT by telephone and verified that I am speaking with the correct person using two identifiers.   I discussed the limitations, risks, security and privacy concerns of performing an evaluation and management service by telephone and the availability of in person appointments. I also discussed with the patient that there may be a patient responsible charge related to this service. The patient expressed understanding and agreed to proceed.   History of Present Illness: [name] is having a tele visit with concerns of constipation she has tried MiraLAX, sorbitol, enemas, and Senokot.  States she is bloated and pain traveling to her back.  Past medical history: Diabetes type 2 hypertension, anxiety, arthritis, and bipolar.   Observations/Objective: Review of Systems  Constitutional: Negative.   HENT: Negative.   Eyes: Negative.   Respiratory: Negative.   Cardiovascular: Negative.   Gastrointestinal: Positive for constipation.  Genitourinary: Negative.   Musculoskeletal: Positive for back pain.  Skin: Negative.   Neurological: Negative.   Endo/Heme/Allergies: Negative.   Psychiatric/Behavioral: Negative.     Assessment and Plan: Diagnoses and all orders for this visit:  Constipation, unspecified constipation type Advised patient to increase fibers green vegetables especially leafy, bran cereal i.e. raisin Bran,  Oatmeal recommend 64 ounces of water daily.  Will send in Amitiza  Hypertension, unspecified type Patient is currently on a combination of olmesartan amlodipine and HCTZ 40 10 25.  Well prescribed for a blood pressure kit to monitor her blood pressure and her insurance endorses it.  Will refer to clinical nurse manager for follow-up on blood pressure kit.   Type 2 diabetes mellitus with diabetic polyneuropathy, with long-term current use of insulin ([place]) March 18, 2019 A1c 12.9 unacceptable  currently supposed to be taking 40 units of Lantus twice daily with NovoLog are 10 units 3 times a day with meals and Janumet 50/1000.  With a A1c this elevated noncompliance may be an issue will refer to clinical pharmacist for diabetic teaching.  Other orders -     lubiprostone (AMITIZA) 24 MCG capsule; Take 1 capsule (24 mcg total) by mouth 2 (two) times daily with a meal.    Follow Up Instructions:    I discussed the assessment and treatment plan with the patient. The patient was provided an opportunity to ask questions and all were answered. The patient agreed with the plan and demonstrated an understanding of the instructions.   The patient was advised to call back or seek an in-person evaluation if the symptoms worsen or if the condition fails to improve as anticipated.  I provided 18 minutes of non-face-to-face time during this encounter.   Kerin Perna, NP

## 2019-05-28 NOTE — Telephone Encounter (Signed)
Spoke to pharmacy - needing a PA for Amitiza to complete filling. Would be under Laurell Josephs

## 2019-05-30 MED ORDER — LUBIPROSTONE 24 MCG PO CAPS: 24.0000 ug | ORAL_CAPSULE | Freq: Two times a day (BID) | ORAL | 2 refills | Status: DC

## 2019-05-31 ENCOUNTER — Ambulatory Visit: Payer: Medicare Other | Admitting: Pharmacist

## 2019-06-01 ENCOUNTER — Telehealth: Payer: Self-pay

## 2019-06-01 ENCOUNTER — Other Ambulatory Visit: Payer: Self-pay | Admitting: Pharmacist

## 2019-06-01 DIAGNOSIS — E7841 Elevated Lipoprotein(a): Secondary | ICD-10-CM

## 2019-06-01 MED ORDER — LOVASTATIN 40 MG PO TABS: 40.0000 mg | ORAL_TABLET | Freq: Every day | ORAL | 1 refills | Status: DC

## 2019-06-01 NOTE — Telephone Encounter (Signed)
Attempted again to contact [facility] # [phone] to clarify information needed for CPAP order. Voicemail message left requesting a call back to this CM # [phone]. There is no option to access and extension and the lines for providers play music, no option to leave a message.  Fax sent to [facility] requesting a call back to this CM

## 2019-06-02 ENCOUNTER — Other Ambulatory Visit: Payer: Self-pay | Admitting: Primary Care

## 2019-06-02 MED ORDER — NALOXEGOL OXALATE 12.5 MG PO TABS: 12.5000 mg | ORAL_TABLET | Freq: Every day | ORAL | 3 refills | Status: AC

## 2019-06-02 NOTE — Telephone Encounter (Signed)
Changed to [facility]  sent in

## 2019-06-02 NOTE — Telephone Encounter (Signed)
Per patients insurance the preferred products are Linzess or Movantik please change to a preferred product or provide clinical reasoning for Amitiza to be submitted along with a PA.

## 2019-06-03 ENCOUNTER — Encounter: Payer: Self-pay | Admitting: Primary Care

## 2019-06-03 NOTE — Telephone Encounter (Signed)
Left detailed message per DPR on voicemail informing that medication was sent in. Could call for questions if needed.

## 2019-06-07 ENCOUNTER — Other Ambulatory Visit: Payer: Self-pay | Admitting: Primary Care

## 2019-06-07 DIAGNOSIS — Z794 Long term (current) use of insulin: Secondary | ICD-10-CM

## 2019-06-07 DIAGNOSIS — I10 Essential (primary) hypertension: Secondary | ICD-10-CM

## 2019-06-07 DIAGNOSIS — E1142 Type 2 diabetes mellitus with diabetic polyneuropathy: Secondary | ICD-10-CM

## 2019-06-08 ENCOUNTER — Ambulatory Visit: Payer: Medicare Other | Admitting: Primary Care

## 2019-06-08 ENCOUNTER — Ambulatory Visit: Payer: Medicare Other | Admitting: Pharmacist

## 2019-06-23 ENCOUNTER — Other Ambulatory Visit (INDEPENDENT_AMBULATORY_CARE_PROVIDER_SITE_OTHER): Payer: Self-pay

## 2019-06-23 DIAGNOSIS — I10 Essential (primary) hypertension: Secondary | ICD-10-CM

## 2019-06-23 MED ORDER — OLMESARTAN-AMLODIPINE-HCTZ 40-10-25 MG PO TABS: 1.0000 | ORAL_TABLET | Freq: Every evening | ORAL | 1 refills | Status: DC

## 2019-06-29 ENCOUNTER — Other Ambulatory Visit (INDEPENDENT_AMBULATORY_CARE_PROVIDER_SITE_OTHER): Payer: Self-pay | Admitting: Primary Care

## 2019-06-29 DIAGNOSIS — E1142 Type 2 diabetes mellitus with diabetic polyneuropathy: Secondary | ICD-10-CM

## 2019-06-29 MED ORDER — GABAPENTIN 300 MG PO CAPS: 300.0000 mg | ORAL_CAPSULE | Freq: Three times a day (TID) | ORAL | 0 refills | Status: DC

## 2019-09-14 ENCOUNTER — Telehealth: Payer: Self-pay

## 2019-12-08 ENCOUNTER — Ambulatory Visit (HOSPITAL_COMMUNITY)
Admission: EM | Admit: 2019-12-08 | Discharge: 2019-12-08 | Disposition: A | Discharge status: Home / Self Care | Payer: Medicare Other | Attending: Emergency Medicine | Admitting: Emergency Medicine

## 2019-12-08 ENCOUNTER — Encounter (HOSPITAL_COMMUNITY): Payer: Self-pay

## 2019-12-08 ENCOUNTER — Other Ambulatory Visit: Payer: Self-pay

## 2019-12-08 DIAGNOSIS — H1012 Acute atopic conjunctivitis, left eye: Secondary | ICD-10-CM

## 2019-12-08 MED ORDER — OLOPATADINE HCL 0.2 % OP SOLN: 1.0000 [drp] | Freq: Once | OPHTHALMIC | 0 refills | Status: AC

## 2019-12-08 MED ORDER — POLYMYXIN B-TRIMETHOPRIM 10000-0.1 UNIT/ML-% OP SOLN: 1.0000 [drp] | OPHTHALMIC | 0 refills | Status: DC

## 2019-12-08 NOTE — Discharge Instructions (Addendum)
Advised patient to use medication as prescribed Follow-up with primary care To return if symptoms get worse

## 2019-12-08 NOTE — ED Provider Notes (Signed)
[facility]    CSN: [id number] Arrival date & time: 12/08/19  1635      History   Chief Complaint Chief Complaint  Patient presents with  . Eye Problem    HPI Stephanie Thornton is a 48 y.o. female.   Stephanie BitterDavid Hipwell 48 years old female presented to urgent care with itchy and watery left eye x 2 days.  She she reported a yellowish discharge this morning.  Denies using any medication.  Denies chills and fever, exposure to flu, strep or conjunctivitis, denies nausea vomiting and diarrhea.  The history is provided by the patient. No language interpreter was used.  Conjunctivitis    Past Medical History:  Diagnosis Date  . Anxiety   . Arthritis    "knees, feet, back" (05/24/2015)  . Benign essential HTN 05/01/2015  . Bipolar disorder (HCC) diagnosed early 790s  . Chronic bronchitis (HCC)    "get it q yr"  . Chronic lower back pain   . Depression   . Diabetes mellitus type II 2010  . GERD (gastroesophageal reflux disease)   . Headache    "maybe 3 times/wk" (05/24/2015)  . History of hiatal hernia   . History of stomach ulcers   . Menopause   . Migraine    "1-2/wk" (05/24/2015)  . OSA on CPAP     Patient Active Problem List   Diagnosis Date Noted  . Unstable angina (HCC)   . Essential hypertension   . Depression   . Diabetes type 2, controlled (HCC)   . Chronic tonsillitis 05/24/2015  . Abnormal EKG 05/01/2015  . Preoperative clearance 05/01/2015  . Hx of peritonsillar abscess drainage 01/03/2015  . DM (diabetes mellitus) (HCC) 09/10/2013  . Hyperosmolar non-ketotic state in patient with type 2 diabetes mellitus (HCC) 09/10/2013  . Nausea & vomiting 09/10/2013  . Chest pain 09/10/2013  . Bipolar disorder (HCC)   . Bipolar I disorder, most recent episode (or current) depressed, severe, without mention of psychotic behavior 11/26/2011    Past Surgical History:  Procedure Laterality Date  . ABDOMINAL HYSTERECTOMY  2007  . BUNIONECTOMY Bilateral   . CARPAL  TUNNEL RELEASE Right   . HAMMER TOE SURGERY Left   . LEFT HEART CATH AND CORONARY ANGIOGRAPHY N/A 05/20/2018   Procedure: LEFT HEART CATH AND CORONARY ANGIOGRAPHY;  Surgeon: Marykay LexHarding, David W, MD;  [facility];  Service: Cardiovascular;  Laterality: N/A;  . TONSILLECTOMY Bilateral 05/24/2015   Procedure: TONSILLECTOMY;  Surgeon: Christia Readingwight Bates, MD;  [facility];  Service: ENT;  Laterality: Bilateral;    OB History   No obstetric history on file.      Home Medications    Prior to Admission medications   Medication Sig Start Date End Date Taking? Authorizing Provider  albuterol (PROVENTIL HFA;VENTOLIN HFA) 108 (90 Base) MCG/ACT inhaler Inhale 1-2 puffs into the lungs every 6 (six) hours as needed for wheezing or shortness of breath. 03/04/19   Grayce SessionsEdwards, Michelle P, NP  amitriptyline (ELAVIL) 150 MG tablet Take 150 mg by mouth daily. 01/28/19   [provider]  aspirin 81 MG EC tablet Take 1 tablet (81 mg total) by mouth daily. 12/15/18   Loletta SpecterGomez, Roger David, PA-C  BD PEN NEEDLE NANO U/F 32G X 4 MM MISC  03/09/19   [provider]  busPIRone (BUSPAR) 10 MG tablet Take 10 mg by mouth 3 (three) times daily. 01/25/19   [provider]  gabapentin (NEURONTIN) 300 MG capsule Take 1 capsule (  300 mg total) by mouth 3 (three) times daily. 06/29/19   Kerin Perna, NP  insulin glargine (LANTUS) 100 UNIT/ML injection Use 40 units at bedtime and 40 units 12 hours after bedtime dose. 12/15/18   Clent Demark, PA-C  Insulin Syringe-Needle U-100 (INSULIN SYRINGE 1CC/30GX1/2") 30G X 1/2" 1 ML MISC Inject 1 application into the skin 5 (five) times daily. 12/15/18   Clent Demark, PA-C  LATUDA 80 MG TABS tablet Take 80 mg by mouth daily. 02/01/19   [provider]  lovastatin (MEVACOR) 40 MG tablet Take 1 tablet (40 mg total) by mouth at bedtime. 06/01/19   Charlott Rakes, MD  lubiprostone (AMITIZA) 24 MCG capsule Take 1 capsule (24 mcg total) by mouth  2 (two) times daily with a meal. 05/30/19   Kerin Perna, NP  Multiple Vitamin (MULTI-VITAMINS) TABS Take 1 tablet by mouth daily. 12/15/18   Clent Demark, PA-C  Olmesartan-amLODIPine-HCTZ 40-10-25 MG TABS Take 1 tablet by mouth every evening. 06/23/19   Kerin Perna, NP  oxyCODONE (ROXICODONE) 15 MG immediate release tablet Take 15 mg by mouth 4 (four) times daily as needed. 02/20/19   [provider]  sitaGLIPtin-metformin (JANUMET) 50-1000 MG tablet Take 1 tablet by mouth 2 (two) times daily with a meal. 12/15/18   Clent Demark, PA-C  topiramate (TOPAMAX) 200 MG tablet Take 200 mg by mouth daily. 01/28/19   [provider]  traZODone (DESYREL) 100 MG tablet Take 200 mg by mouth daily.  02/24/19   [provider]    Family History Family History  Problem Relation Age of Onset  . Bipolar disorder Mother   . CVA Mother   . Bipolar disorder Son   . Depression Brother        schizoaffective d/o  . Hypertension Sister   . Heart disease Maternal Aunt   . Heart attack Maternal Aunt   . Hypertension Brother     Social History Social History   Tobacco Use  . Smoking status: Current Some Day Smoker    Packs/day: 1.00    Years: 30.00    Pack years: 30.00    Types: Cigarettes  . Smokeless tobacco: Never Used  Substance Use Topics  . Alcohol use: No  . Drug use: No     Allergies   Lyrica [pregabalin], Rocephin [ceftriaxone sodium in dextrose], Tramadol, Vancomycin, Glipizide, Keflex [cephalexin], Penicillins, Sulfa antibiotics, Latex, and Sitagliptin-metformin hcl   Review of Systems Review of Systems  Constitutional: Negative.   HENT: Negative.   Eyes: Positive for discharge, redness and itching. Negative for photophobia, pain and visual disturbance.  Respiratory: Negative.   Cardiovascular: Negative.   Gastrointestinal: Negative.      Physical Exam Triage Vital Signs ED Triage Vitals  Enc Vitals Group     BP 12/08/19  1714 (!) 147/94     Pulse Rate 12/08/19 1714 96     Resp 12/08/19 1714 18     Temp 12/08/19 1714 98.8 F (37.1 C)     Temp src --      SpO2 12/08/19 1714 99 %     Weight --      Height --      Head Circumference --      Peak Flow --      Pain Score 12/08/19 1712 0     Pain Loc --      Pain Edu? --      Excl. in GC? --    No  data found.  Updated Vital Signs BP (!) 147/94   Pulse 96   Temp 98.8 F (37.1 C)   Resp 18   SpO2 99%   Visual Acuity Right Eye Distance: 20/50 Left Eye Distance: 20/50(pt does not have glasses with her) Bilateral Distance: 20/50  Right Eye Near:   Left Eye Near:    Bilateral Near:     Physical Exam Vitals signs and nursing note reviewed.  Constitutional:      General: She is not in acute distress.    Appearance: Normal appearance. She is normal weight. She is not ill-appearing.  HENT:     Head: Normocephalic.     Right Ear: Tympanic membrane, ear canal and external ear normal. There is no impacted cerumen.     Left Ear: Ear canal and external ear normal.     Nose: Nose normal.     Mouth/Throat:     Mouth: Mucous membranes are moist.     Pharynx: Oropharynx is clear. No oropharyngeal exudate or posterior oropharyngeal erythema.  Eyes:     General:        Right eye: No discharge.        Left eye: Discharge present.    Extraocular Movements: Extraocular movements intact.     Conjunctiva/sclera:     Right eye: Right conjunctiva is not injected.     Left eye: Left conjunctiva is not injected.     Pupils: Pupils are equal, round, and reactive to light.  Cardiovascular:     Rate and Rhythm: Normal rate and regular rhythm.     Pulses: Normal pulses.     Heart sounds: Normal heart sounds. No murmur.  Pulmonary:     Effort: Pulmonary effort is normal. No respiratory distress.     Breath sounds: No stridor. No wheezing or rhonchi.  Neurological:     Mental Status: She is alert.      UC Treatments / Results  Labs (all labs ordered are  listed, but only abnormal results are displayed) Labs Reviewed - No data to display  EKG   Radiology No results found.  Procedures Procedures (including critical care time)  Medications Ordered in UC Medications - No data to display  Initial Impression / Assessment and Plan / UC Course  I have reviewed the triage vital signs and the nursing notes.  Pertinent labs & imaging results that were available during my care of the patient were reviewed by me and considered in my medical decision making (see chart for details).    Patient stable for discharge.  Afebrile physical exam.  Possible bacterial conjunctivitis but will treat with antibiotic to prevent infection as patient keep moving her hand to her eyes.  Advised patient to follow-up with primary care, to  return if symptoms get worse Final Clinical Impressions(s) / UC Diagnoses   Final diagnoses:  Allergic conjunctivitis of left eye     Discharge Instructions     Advised patient to use medication as prescribed Follow-up with primary care To return if symptoms get worse    ED Prescriptions    None     PDMP not reviewed this encounter.   Durward Parcel, FNP 12/08/19 1807

## 2019-12-08 NOTE — ED Triage Notes (Signed)
Pt presents with left eye irritation x 2 days. States that it is progressively getting worse with irritation and a film over it. Patient denies pain or vision loss. Pt denies any injury.

## 2020-03-10 ENCOUNTER — Encounter: Payer: Self-pay | Admitting: Primary Care

## 2020-03-10 LAB — HM DIABETES EYE EXAM

## 2020-03-24 ENCOUNTER — Ambulatory Visit: Payer: Self-pay

## 2020-03-30 ENCOUNTER — Ambulatory Visit: Payer: Self-pay | Attending: Internal Medicine

## 2020-03-30 DIAGNOSIS — Z23 Encounter for immunization: Secondary | ICD-10-CM

## 2020-03-30 NOTE — Progress Notes (Signed)
   Covid-19 Vaccination Clinic  Name:  Stephanie Thornton    MRN: [id number] DOB: 21-Sep-1971  03/30/2020  [name] was observed post Covid-19 immunization for 30 minutes based on pre-vaccination screening without incident. She was provided with Vaccine Information Sheet and instruction to access the V-Safe system.   [name] was instructed to call 911 with any severe reactions post vaccine: Marland Kitchen Difficulty breathing  . Swelling of face and throat  . A fast heartbeat  . A bad rash all over body  . Dizziness and weakness   Immunizations Administered    Name Date Dose VIS Date Route   Pfizer COVID-19 Vaccine 03/30/2020 10:01 AM 0.3 mL 12/10/2019 Intramuscular   Manufacturer: [employer], [employer]   Lot: [id number]   NDC: [address]-6

## 2020-04-24 ENCOUNTER — Ambulatory Visit: Payer: Self-pay

## 2020-05-24 NOTE — Telephone Encounter (Signed)
CREATED IN ERROR

## 2020-12-05 ENCOUNTER — Ambulatory Visit (INDEPENDENT_AMBULATORY_CARE_PROVIDER_SITE_OTHER): Payer: Medicare HMO

## 2020-12-05 ENCOUNTER — Other Ambulatory Visit: Payer: Self-pay

## 2020-12-05 ENCOUNTER — Ambulatory Visit (HOSPITAL_COMMUNITY)
Admission: EM | Admit: 2020-12-05 | Discharge: 2020-12-05 | Disposition: A | Discharge status: Home / Self Care | Payer: Medicare HMO | Attending: Emergency Medicine | Admitting: Emergency Medicine

## 2020-12-05 ENCOUNTER — Encounter (HOSPITAL_COMMUNITY): Payer: Self-pay

## 2020-12-05 DIAGNOSIS — R630 Anorexia: Secondary | ICD-10-CM | POA: Diagnosis not present

## 2020-12-05 DIAGNOSIS — R1084 Generalized abdominal pain: Secondary | ICD-10-CM | POA: Insufficient documentation

## 2020-12-05 DIAGNOSIS — R1110 Vomiting, unspecified: Secondary | ICD-10-CM

## 2020-12-05 DIAGNOSIS — R112 Nausea with vomiting, unspecified: Secondary | ICD-10-CM | POA: Diagnosis not present

## 2020-12-05 DIAGNOSIS — K5900 Constipation, unspecified: Secondary | ICD-10-CM | POA: Diagnosis present

## 2020-12-05 DIAGNOSIS — R109 Unspecified abdominal pain: Secondary | ICD-10-CM

## 2020-12-05 DIAGNOSIS — R5383 Other fatigue: Secondary | ICD-10-CM

## 2020-12-05 LAB — COMPREHENSIVE METABOLIC PANEL
ALT: 15 U/L (ref 0–44)
AST: 13 U/L — ABNORMAL LOW (ref 15–41)
Albumin: 4.2 g/dL (ref 3.5–5.0)
Alkaline Phosphatase: 110 U/L (ref 38–126)
Anion gap: 13 (ref 5–15)
BUN: 7 mg/dL (ref 6–20)
CO2: 19 mmol/L — ABNORMAL LOW (ref 22–32)
Calcium: 9.8 mg/dL (ref 8.9–10.3)
Chloride: 100 mmol/L (ref 98–111)
Creatinine, Ser: 0.81 mg/dL (ref 0.44–1.00)
GFR, Estimated: >60 mL/min (ref >60)
Glucose, Bld: 184 mg/dL — ABNORMAL HIGH (ref 70–99)
Potassium: 3 mmol/L — ABNORMAL LOW (ref 3.5–5.1)
Sodium: 132 mmol/L — ABNORMAL LOW (ref 135–145)
Total Bilirubin: 0.6 mg/dL (ref 0.3–1.2)
Total Protein: 8.8 g/dL — ABNORMAL HIGH (ref 6.5–8.1)

## 2020-12-05 LAB — POCT URINALYSIS DIPSTICK, ED / UC
Bilirubin Urine: NEGATIVE
Glucose, UA: NEGATIVE mg/dL
Hgb urine dipstick: NEGATIVE
Ketones, ur: NEGATIVE mg/dL
Leukocytes,Ua: NEGATIVE
Nitrite: NEGATIVE
Protein, ur: 30 mg/dL — AB
Specific Gravity, Urine: 1.015 (ref 1.005–1.030)
Urobilinogen, UA: 0.2 mg/dL (ref 0.0–1.0)
pH: 8.5 — ABNORMAL HIGH (ref 5.0–8.0)

## 2020-12-05 LAB — CBC WITH DIFFERENTIAL/PLATELET
Abs Immature Granulocytes: 0.05 10*3/uL (ref 0.00–0.07)
Basophils Absolute: 0.1 10*3/uL (ref 0.0–0.1)
Basophils Relative: 0 %
Eosinophils Absolute: 0.1 10*3/uL (ref 0.0–0.5)
Eosinophils Relative: 0 %
HCT: 47.1 % — ABNORMAL HIGH (ref 36.0–46.0)
Hemoglobin: 15.4 g/dL — ABNORMAL HIGH (ref 12.0–15.0)
Immature Granulocytes: 0 %
Lymphocytes Relative: 18 %
Lymphs Abs: 2.6 10*3/uL (ref 0.7–4.0)
MCH: 26.1 pg (ref 26.0–34.0)
MCHC: 32.7 g/dL (ref 30.0–36.0)
MCV: 79.8 fL — ABNORMAL LOW (ref 80.0–100.0)
Monocytes Absolute: 0.8 10*3/uL (ref 0.1–1.0)
Monocytes Relative: 6 %
Neutro Abs: 11.2 10*3/uL — ABNORMAL HIGH (ref 1.7–7.7)
Neutrophils Relative %: 76 %
Platelets: 320 10*3/uL (ref 150–400)
RBC: 5.9 MIL/uL — ABNORMAL HIGH (ref 3.87–5.11)
RDW: 15.3 % (ref 11.5–15.5)
WBC: 14.7 10*3/uL — ABNORMAL HIGH (ref 4.0–10.5)
nRBC: 0 % (ref 0.0–0.2)

## 2020-12-05 LAB — LIPASE, BLOOD: Lipase: 23 U/L (ref 11–51)

## 2020-12-05 LAB — CBG MONITORING, ED: Glucose-Capillary: 181 mg/dL — ABNORMAL HIGH (ref 70–99)

## 2020-12-05 MED ORDER — ONDANSETRON 4 MG PO TBDP: 4.0000 mg | ORAL_TABLET | Freq: Three times a day (TID) | ORAL | 0 refills | Status: DC | PRN

## 2020-12-05 MED ORDER — POLYETHYLENE GLYCOL 3350 17 GM/SCOOP PO POWD: 17.0000 g | Freq: Every day | ORAL | 0 refills | Status: DC

## 2020-12-05 MED ORDER — ONDANSETRON 4 MG PO TBDP
ORAL_TABLET | ORAL | Status: AC
  Filled 2020-12-05: qty 1

## 2020-12-05 MED ORDER — ONDANSETRON 4 MG PO TBDP
4.0000 mg | ORAL_TABLET | Freq: Once | ORAL | Status: AC
  Administered 2020-12-05: 4 mg via ORAL

## 2020-12-05 NOTE — ED Triage Notes (Signed)
Pt presents with nausea and loss of appetite X 2 days.

## 2020-12-05 NOTE — Discharge Instructions (Signed)
Your xray does demonstrate moderate amount of stool, I do wonder if constipation is contributing to your symptoms.  Miralax daily to promote large BM.  Increase water and fiber in your diet.  Zofran every 8 hours as needed for nausea or vomiting.   I will call if there are any concerning findings with your labs.  Please go to the ER for any worsening symptoms.

## 2020-12-05 NOTE — ED Provider Notes (Signed)
[facility]    CSN: [id number] Arrival date & time: 12/05/20  0940      History   Chief Complaint Chief Complaint  Patient presents with  . Nausea  . Loss of Appetite  . Vomiting  . Fatigue    HPI Stephanie Thornton is a 49 y.o. female.   [facility] presents with complaints of abdominal pain and vomiting which started two days ago. Vomited 4 times yesterday but hasn't vomited today. No fevers or URI symptoms. No back pain. Normal urination. No headache. Non bloody non bilious. Umbilical and LUQ abdominal pain. She has been taking her medications. No known ill contacts. Endorses feeling constipated, unknown last BM, maybe 5-6 days ago.    ROS per HPI, negative if not otherwise mentioned.      Past Medical History:  Diagnosis Date  . Anxiety   . Arthritis    "knees, feet, back" (05/24/2015)  . Benign essential HTN 05/01/2015  . Bipolar disorder (HCC) diagnosed early 29s  . Chronic bronchitis (HCC)    "get it q yr"  . Chronic lower back pain   . Depression   . Diabetes mellitus type II 2010  . GERD (gastroesophageal reflux disease)   . Headache    "maybe 3 times/wk" (05/24/2015)  . History of hiatal hernia   . History of stomach ulcers   . Menopause   . Migraine    "1-2/wk" (05/24/2015)  . OSA on CPAP     Patient Active Problem List   Diagnosis Date Noted  . Unstable angina (HCC)   . Essential hypertension   . Depression   . Diabetes type 2, controlled (HCC)   . Chronic tonsillitis 05/24/2015  . Abnormal EKG 05/01/2015  . Preoperative clearance 05/01/2015  . Hx of peritonsillar abscess drainage 01/03/2015  . DM (diabetes mellitus) (HCC) 09/10/2013  . Hyperosmolar non-ketotic state in patient with type 2 diabetes mellitus (HCC) 09/10/2013  . Nausea & vomiting 09/10/2013  . Chest pain 09/10/2013  . Bipolar disorder (HCC)   . Bipolar I disorder, most recent episode (or current) depressed, severe, without mention of psychotic behavior  11/26/2011    Past Surgical History:  Procedure Laterality Date  . ABDOMINAL HYSTERECTOMY  2007  . BUNIONECTOMY Bilateral   . CARPAL TUNNEL RELEASE Right   . HAMMER TOE SURGERY Left   . LEFT HEART CATH AND CORONARY ANGIOGRAPHY N/A 05/20/2018   Procedure: LEFT HEART CATH AND CORONARY ANGIOGRAPHY;  Surgeon: Marykay Lex, MD;  Location: [facility];  Service: Cardiovascular;  Laterality: N/A;  . TONSILLECTOMY Bilateral 05/24/2015   Procedure: TONSILLECTOMY;  Surgeon: Christia Reading, MD;  Location: [facility];  Service: ENT;  Laterality: Bilateral;    OB History   No obstetric history on file.      Home Medications    Prior to Admission medications   Medication Sig Start Date End Date Taking? Authorizing Provider  albuterol (PROVENTIL HFA;VENTOLIN HFA) 108 (90 Base) MCG/ACT inhaler Inhale 1-2 puffs into the lungs every 6 (six) hours as needed for wheezing or shortness of breath. 03/04/19   Grayce Sessions, NP  amitriptyline (ELAVIL) 150 MG tablet Take 150 mg by mouth daily. 01/28/19   [provider]  aspirin 81 MG EC tablet Take 1 tablet (81 mg total) by mouth daily. 12/15/18   Loletta Specter, PA-C  BD PEN NEEDLE NANO U/F 32G X 4 MM MISC  03/09/19   [provider]  busPIRone (BUSPAR) 10 MG tablet  Take 10 mg by mouth 3 (three) times daily. 01/25/19   [provider]  gabapentin (NEURONTIN) 300 MG capsule Take 1 capsule (300 mg total) by mouth 3 (three) times daily. 06/29/19   Grayce SessionsEdwards, Michelle P, NP  insulin glargine (LANTUS) 100 UNIT/ML injection Use 40 units at bedtime and 40 units 12 hours after bedtime dose. 12/15/18   Loletta SpecterGomez, Roger David, PA-C  Insulin Syringe-Needle U-100 (INSULIN SYRINGE 1CC/30GX1/2") 30G X 1/2" 1 ML MISC Inject 1 application into the skin 5 (five) times daily. 12/15/18   Loletta SpecterGomez, Roger David, PA-C  LATUDA 80 MG TABS tablet Take 80 mg by mouth daily. 02/01/19   [provider]  lovastatin (MEVACOR) 40 MG tablet Take 1 tablet  (40 mg total) by mouth at bedtime. 06/01/19   Hoy RegisterNewlin, Enobong, MD  lubiprostone (AMITIZA) 24 MCG capsule Take 1 capsule (24 mcg total) by mouth 2 (two) times daily with a meal. 05/30/19   Grayce SessionsEdwards, Michelle P, NP  Multiple Vitamin (MULTI-VITAMINS) TABS Take 1 tablet by mouth daily. 12/15/18   Loletta SpecterGomez, Roger David, PA-C  Olmesartan-amLODIPine-HCTZ 40-10-25 MG TABS Take 1 tablet by mouth every evening. 06/23/19   Grayce SessionsEdwards, Michelle P, NP  ondansetron (ZOFRAN-ODT) 4 MG disintegrating tablet Take 1 tablet (4 mg total) by mouth every 8 (eight) hours as needed for nausea or vomiting. 12/05/20   Georgetta HaberBurky, Shiheem Corporan B, NP  oxyCODONE (ROXICODONE) 15 MG immediate release tablet Take 15 mg by mouth 4 (four) times daily as needed. 02/20/19   [provider]  polyethylene glycol powder (GLYCOLAX/MIRALAX) 17 GM/SCOOP powder Take 17 g by mouth daily. 12/05/20   Georgetta HaberBurky, Jazz Biddy B, NP  sitaGLIPtin-metformin (JANUMET) 50-1000 MG tablet Take 1 tablet by mouth 2 (two) times daily with a meal. 12/15/18   Loletta SpecterGomez, Roger David, PA-C  topiramate (TOPAMAX) 200 MG tablet Take 200 mg by mouth daily. 01/28/19   [provider]  traZODone (DESYREL) 100 MG tablet Take 200 mg by mouth daily.  02/24/19   [provider]  trimethoprim-polymyxin b (POLYTRIM) ophthalmic solution Place 1 drop into both eyes every 4 (four) hours. 12/08/19   AvegnoZachery Dakins, Komlanvi S, FNP    Family History Family History  Problem Relation Age of Onset  . Bipolar disorder Mother   . CVA Mother   . Bipolar disorder Son   . Depression Brother        schizoaffective d/o  . Hypertension Sister   . Heart disease Maternal Aunt   . Heart attack Maternal Aunt   . Hypertension Brother     Social History Social History   Tobacco Use  . Smoking status: Current Some Day Smoker    Packs/day: 1.00    Years: 30.00    Pack years: 30.00    Types: Cigarettes  . Smokeless tobacco: Never Used  Vaping Use  . Vaping Use: Never used  Substance Use Topics   . Alcohol use: No  . Drug use: No     Allergies   Lyrica [pregabalin], Rocephin [ceftriaxone sodium in dextrose], Tramadol, Vancomycin, Glipizide, Keflex [cephalexin], Penicillins, Sulfa antibiotics, Latex, and Sitagliptin-metformin hcl   Review of Systems Review of Systems   Physical Exam Triage Vital Signs ED Triage Vitals  Enc Vitals Group     BP 12/05/20 1137 (!) 185/115     Pulse Rate 12/05/20 1137 (!) 115     Resp 12/05/20 1137 20     Temp 12/05/20 1137 98.1 F (36.7 C)     Temp Source 12/05/20 1137 Oral  SpO2 12/05/20 1137 100 %     Weight --      Height --      Head Circumference --      Peak Flow --      Pain Score 12/05/20 1138 4     Pain Loc --      Pain Edu? --      Excl. in GC? --    No data found.  Updated Vital Signs BP (!) 185/115 (BP Location: Right Arm)   Pulse (!) 115   Temp 98.1 F (36.7 C) (Oral)   Resp 20   SpO2 100%   Visual Acuity Right Eye Distance:   Left Eye Distance:   Bilateral Distance:    Right Eye Near:   Left Eye Near:    Bilateral Near:     Physical Exam Constitutional:      General: She is not in acute distress.    Appearance: She is well-developed.  Cardiovascular:     Rate and Rhythm: Normal rate.  Pulmonary:     Effort: Pulmonary effort is normal.  Abdominal:     Tenderness: There is abdominal tenderness (very mild on palpation ) in the periumbilical area and left upper quadrant.  Skin:    General: Skin is warm and dry.  Neurological:     Mental Status: She is alert and oriented to person, place, and time.      UC Treatments / Results  Labs (all labs ordered are listed, but only abnormal results are displayed) Labs Reviewed  CBC WITH DIFFERENTIAL/PLATELET - Abnormal; Notable for the following components:      Result Value   WBC 14.7 (*)    RBC 5.90 (*)    Hemoglobin 15.4 (*)    HCT 47.1 (*)    MCV 79.8 (*)    Neutro Abs 11.2 (*)    All other components within normal limits  COMPREHENSIVE  METABOLIC PANEL - Abnormal; Notable for the following components:   Sodium 132 (*)    Potassium 3.0 (*)    CO2 19 (*)    Glucose, Bld 184 (*)    Total Protein 8.8 (*)    AST 13 (*)    All other components within normal limits  CBG MONITORING, ED - Abnormal; Notable for the following components:   Glucose-Capillary 181 (*)    All other components within normal limits  POCT URINALYSIS DIPSTICK, ED / UC - Abnormal; Notable for the following components:   pH 8.5 (*)    Protein, ur 30 (*)    All other components within normal limits  LIPASE, BLOOD    EKG   Radiology DG Abd 2 Views  Result Date: 12/05/2020 CLINICAL DATA:  Abdominal pain, vomiting. EXAM: ABDOMEN - 2 VIEW COMPARISON:  April 05, 2012. FINDINGS: No abnormal bowel dilatation is noted. Moderate amount of stool is seen throughout the colon. There is no evidence of free air. No radio-opaque calculi or other significant radiographic abnormality is seen. IMPRESSION: Moderate stool burden. Electronically Signed   By: Lupita Raider M.D.   On: 12/05/2020 13:05    Procedures Procedures (including critical care time)  Medications Ordered in UC Medications  ondansetron (ZOFRAN-ODT) disintegrating tablet 4 mg (4 mg Oral Given 12/05/20 1149)    Initial Impression / Assessment and Plan / UC Course  I have reviewed the triage vital signs and the nursing notes.  Pertinent labs & imaging results that were available during my care of the patient were reviewed by  me and considered in my medical decision making (see chart for details).   patient with mild pain and nausea. No vomiting while here and improved nausea with zofran provided. Blood sugar stable and urine without ketones. Moderate stool without obstruction on plain film. miralax recommended. Basic labs obtained and pending on time of discharge with strict er precautions.  Lipase stable. WBC elevated, elevated neutrophil count. Patient notified mychart, with strict er precautions  if no improvement in the next 24 hours.   Final Clinical Impressions(s) / UC Diagnoses   Final diagnoses:  Generalized abdominal pain  Constipation, unspecified constipation type     Discharge Instructions     Your xray does demonstrate moderate amount of stool, I do wonder if constipation is contributing to your symptoms.  Miralax daily to promote large BM.  Increase water and fiber in your diet.  Zofran every 8 hours as needed for nausea or vomiting.   I will call if there are any concerning findings with your labs.  Please go to the ER for any worsening symptoms.     ED Prescriptions    Medication Sig Dispense Auth. Provider   polyethylene glycol powder (GLYCOLAX/MIRALAX) 17 GM/SCOOP powder Take 17 g by mouth daily. 255 g Linus Mako B, NP   ondansetron (ZOFRAN-ODT) 4 MG disintegrating tablet Take 1 tablet (4 mg total) by mouth every 8 (eight) hours as needed for nausea or vomiting. 12 tablet Georgetta Haber, NP     PDMP not reviewed this encounter.   Georgetta Haber, NP 12/05/20 1609

## 2020-12-16 ENCOUNTER — Encounter (HOSPITAL_COMMUNITY): Payer: Self-pay | Admitting: *Deleted

## 2020-12-16 ENCOUNTER — Emergency Department (HOSPITAL_COMMUNITY)
Admission: EM | Admit: 2020-12-16 | Discharge: 2020-12-16 | Disposition: A | Discharge status: Home / Self Care | Payer: Medicare HMO | Attending: Emergency Medicine | Admitting: Emergency Medicine

## 2020-12-16 ENCOUNTER — Emergency Department (HOSPITAL_COMMUNITY): Payer: Medicare HMO

## 2020-12-16 ENCOUNTER — Other Ambulatory Visit: Payer: Self-pay

## 2020-12-16 DIAGNOSIS — Z7982 Long term (current) use of aspirin: Secondary | ICD-10-CM | POA: Insufficient documentation

## 2020-12-16 DIAGNOSIS — Z794 Long term (current) use of insulin: Secondary | ICD-10-CM | POA: Insufficient documentation

## 2020-12-16 DIAGNOSIS — E1143 Type 2 diabetes mellitus with diabetic autonomic (poly)neuropathy: Secondary | ICD-10-CM | POA: Diagnosis not present

## 2020-12-16 DIAGNOSIS — F17210 Nicotine dependence, cigarettes, uncomplicated: Secondary | ICD-10-CM | POA: Diagnosis not present

## 2020-12-16 DIAGNOSIS — K3184 Gastroparesis: Secondary | ICD-10-CM | POA: Diagnosis not present

## 2020-12-16 DIAGNOSIS — I10 Essential (primary) hypertension: Secondary | ICD-10-CM | POA: Diagnosis not present

## 2020-12-16 DIAGNOSIS — R1011 Right upper quadrant pain: Secondary | ICD-10-CM | POA: Diagnosis present

## 2020-12-16 DIAGNOSIS — Z79899 Other long term (current) drug therapy: Secondary | ICD-10-CM | POA: Diagnosis not present

## 2020-12-16 LAB — COMPREHENSIVE METABOLIC PANEL WITH GFR
ALT: 11 U/L (ref 0–44)
AST: 16 U/L (ref 15–41)
Albumin: 4.2 g/dL (ref 3.5–5.0)
Alkaline Phosphatase: 82 U/L (ref 38–126)
Anion gap: 12 (ref 5–15)
BUN: 12 mg/dL (ref 6–20)
CO2: 24 mmol/L (ref 22–32)
Calcium: 9.4 mg/dL (ref 8.9–10.3)
Chloride: 101 mmol/L (ref 98–111)
Creatinine, Ser: 1.17 mg/dL — ABNORMAL HIGH (ref 0.44–1.00)
GFR, Estimated: 58 mL/min — ABNORMAL LOW (ref >60)
Glucose, Bld: 205 mg/dL — ABNORMAL HIGH (ref 70–99)
Potassium: 4.1 mmol/L (ref 3.5–5.1)
Sodium: 137 mmol/L (ref 135–145)
Total Bilirubin: 0.4 mg/dL (ref 0.3–1.2)
Total Protein: 8 g/dL (ref 6.5–8.1)

## 2020-12-16 LAB — I-STAT BETA HCG BLOOD, ED (MC, WL, AP ONLY): I-stat hCG, quantitative: <5 m[IU]/mL (ref <5)

## 2020-12-16 LAB — URINALYSIS, ROUTINE W REFLEX MICROSCOPIC
Bilirubin Urine: NEGATIVE
Glucose, UA: 150 mg/dL — AB
Hgb urine dipstick: NEGATIVE
Ketones, ur: NEGATIVE mg/dL
Leukocytes,Ua: NEGATIVE
Nitrite: NEGATIVE
Protein, ur: NEGATIVE mg/dL
Specific Gravity, Urine: 1.016 (ref 1.005–1.030)
pH: 8 (ref 5.0–8.0)

## 2020-12-16 LAB — CBC
HCT: 41.5 % (ref 36.0–46.0)
Hemoglobin: 13.3 g/dL (ref 12.0–15.0)
MCH: 26.7 pg (ref 26.0–34.0)
MCHC: 32 g/dL (ref 30.0–36.0)
MCV: 83.3 fL (ref 80.0–100.0)
Platelets: 312 K/uL (ref 150–400)
RBC: 4.98 MIL/uL (ref 3.87–5.11)
RDW: 15.6 % — ABNORMAL HIGH (ref 11.5–15.5)
WBC: 11 K/uL — ABNORMAL HIGH (ref 4.0–10.5)
nRBC: 0 % (ref 0.0–0.2)

## 2020-12-16 LAB — CBG MONITORING, ED: Glucose-Capillary: 155 mg/dL — ABNORMAL HIGH (ref 70–99)

## 2020-12-16 LAB — LIPASE, BLOOD: Lipase: 23 U/L (ref 11–51)

## 2020-12-16 MED ORDER — IOHEXOL 300 MG/ML  SOLN
100.0000 mL | Freq: Once | INTRAMUSCULAR | Status: AC | PRN
  Administered 2020-12-16: 100 mL via INTRAVENOUS

## 2020-12-16 MED ORDER — METOCLOPRAMIDE HCL 5 MG/ML IJ SOLN
10.0000 mg | Freq: Once | INTRAMUSCULAR | Status: AC
  Administered 2020-12-16: 10 mg via INTRAVENOUS
  Filled 2020-12-16: qty 2

## 2020-12-16 MED ORDER — METOCLOPRAMIDE HCL 10 MG PO TABS: 10.0000 mg | ORAL_TABLET | Freq: Four times a day (QID) | ORAL | 0 refills | Status: DC | PRN

## 2020-12-16 MED ORDER — HYDRALAZINE HCL 20 MG/ML IJ SOLN
10.0000 mg | Freq: Once | INTRAMUSCULAR | Status: AC
  Administered 2020-12-16: 10 mg via INTRAVENOUS
  Filled 2020-12-16: qty 1

## 2020-12-16 NOTE — Discharge Instructions (Signed)
Please read the attachment on gastroparesis.  I prescribed you Reglan, please take as directed.  Also encourage you to continue drinking plenty of water and taking stool softeners as needed.  Follow-up if you to follow-up with your primary care provider on Monday regarding today's encounter.  Your blood pressure was exceedingly high here in the ED, but it appears to open similarly high during your urgent care encounter earlier this month.  Suspect this is an ongoing issue and you will benefit from medication adjustment.  Return to the ED or seek immediate medical attention should you experience any new or worsening symptoms.

## 2020-12-16 NOTE — ED Provider Notes (Signed)
[facility]-EMERGENCY DEPT Provider Note   CSN: [id number] Arrival date & time: 12/16/20  0753     History Chief Complaint  Patient presents with  . Abdominal Pain  . Constipation    Stephanie Thornton is a 49 y.o. female with PMH of GERD, IDT2DM, bipolar disorder, HTN, HLD, and PUD who presents to the ED with complaints of right upper quadrant abdominal pain and constipation.  I reviewed her medical record and she was evaluated on 12/05/2020 at an urgent care for periumbilical and left upper quadrant abdominal discomfort with associated nausea and emesis.  Laboratory work-up was largely unremarkable and here left was recommended given moderate stool without obstruction on plain films.  Encouraged to proceed to ER if her symptoms did not improve in 24 hours.  On my examination, patient reports that she has been experiencing right upper quadrant abdominal pain that radiates to her right shoulder over the course of the past 10 days.  She states that she is experiencing worsening abdominal pain and nausea symptoms immediately after eating.  Past abdominal surgical history notable for hysterectomy.  She states that she has consumed MiraLAX per recommendation of urgent care, however she has only had a couple very small bowel movements.  She endorses mild BRBPR, but states that she has a colonoscopy scheduled with her gastroenterologist at [facility] on 12/25/2020 as well as an upper endoscopy scheduled for 12/18/2020.  She states that her abdomen feels bloated.  She has had mild, intermittent episodes of nonbloody emesis.  She states that her pain was significant today which prompted her to come to the ED for evaluation.  She denies any fevers or chills, chest pain or shortness of breath, cough, urinary symptoms, vaginal discharge/bleeding, melena, or other symptoms.   HPI     Past Medical History:  Diagnosis Date  . Anxiety   . Arthritis    "knees, feet, back"  (05/24/2015)  . Benign essential HTN 05/01/2015  . Bipolar disorder (HCC) diagnosed early 63s  . Chronic bronchitis (HCC)    "get it q yr"  . Chronic lower back pain   . Depression   . Diabetes mellitus type II 2010  . GERD (gastroesophageal reflux disease)   . Headache    "maybe 3 times/wk" (05/24/2015)  . History of hiatal hernia   . History of stomach ulcers   . Menopause   . Migraine    "1-2/wk" (05/24/2015)  . OSA on CPAP     Patient Active Problem List   Diagnosis Date Noted  . Unstable angina (HCC)   . Essential hypertension   . Depression   . Diabetes type 2, controlled (HCC)   . Chronic tonsillitis 05/24/2015  . Abnormal EKG 05/01/2015  . Preoperative clearance 05/01/2015  . Hx of peritonsillar abscess drainage 01/03/2015  . DM (diabetes mellitus) (HCC) 09/10/2013  . Hyperosmolar non-ketotic state in patient with type 2 diabetes mellitus (HCC) 09/10/2013  . Nausea & vomiting 09/10/2013  . Chest pain 09/10/2013  . Bipolar disorder (HCC)   . Bipolar I disorder, most recent episode (or current) depressed, severe, without mention of psychotic behavior 11/26/2011    Past Surgical History:  Procedure Laterality Date  . ABDOMINAL HYSTERECTOMY  2007  . BUNIONECTOMY Bilateral   . CARPAL TUNNEL RELEASE Right   . HAMMER TOE SURGERY Left   . LEFT HEART CATH AND CORONARY ANGIOGRAPHY N/A 05/20/2018   Procedure: LEFT HEART CATH AND CORONARY ANGIOGRAPHY;  Surgeon: Marykay Lex, MD;  Location: [facility] INVASIVE CV LAB;  Service: Cardiovascular;  Laterality: N/A;  . TONSILLECTOMY Bilateral 05/24/2015   Procedure: TONSILLECTOMY;  Surgeon: Christia Readingwight Bates, MD;  [facility];  Service: ENT;  Laterality: Bilateral;     OB History   No obstetric history on file.     Family History  Problem Relation Age of Onset  . Bipolar disorder Mother   . CVA Mother   . Bipolar disorder Son   . Depression Brother        schizoaffective d/o  . Hypertension Sister   . Heart disease Maternal  Aunt   . Heart attack Maternal Aunt   . Hypertension Brother     Social History   Tobacco Use  . Smoking status: Current Some Day Smoker    Packs/day: 1.00    Years: 30.00    Pack years: 30.00    Types: Cigarettes  . Smokeless tobacco: Never Used  Vaping Use  . Vaping Use: Never used  Substance Use Topics  . Alcohol use: No  . Drug use: No    Home Medications Prior to Admission medications   Medication Sig Start Date End Date Taking? Authorizing Provider  albuterol (PROVENTIL HFA;VENTOLIN HFA) 108 (90 Base) MCG/ACT inhaler Inhale 1-2 puffs into the lungs every 6 (six) hours as needed for wheezing or shortness of breath. 03/04/19  Yes Grayce SessionsEdwards, Michelle P, NP  amitriptyline (ELAVIL) 150 MG tablet Take 150 mg by mouth daily. 01/28/19  Yes [provider]  aspirin 81 MG EC tablet Take 1 tablet (81 mg total) by mouth daily. 12/15/18  Yes Loletta SpecterGomez, Roger David, PA-C  busPIRone (BUSPAR) 10 MG tablet Take 10 mg by mouth 3 (three) times daily. 01/25/19  Yes [provider]  FARXIGA 10 MG TABS tablet Take 20 mg by mouth daily. 09/19/20  Yes [provider]  gabapentin (NEURONTIN) 300 MG capsule Take 1 capsule (300 mg total) by mouth 3 (three) times daily. 06/29/19  Yes Edwards, Michelle P, NP  LATUDA 80 MG TABS tablet Take 80 mg by mouth daily. 02/01/19  Yes [provider]  LINZESS 72 MCG capsule Take 72 mcg by mouth daily before breakfast. 10/09/20  Yes [provider]  lovastatin (MEVACOR) 40 MG tablet Take 1 tablet (40 mg total) by mouth at bedtime. 06/01/19  Yes Hoy RegisterNewlin, Enobong, MD  Multiple Vitamin (MULTI-VITAMINS) TABS Take 1 tablet by mouth daily. 12/15/18  Yes Loletta SpecterGomez, Roger David, PA-C  NOVOLOG 100 UNIT/ML injection Inject 0.5-30 Units into the skin daily. 11/21/20  Yes [provider]  Olmesartan-amLODIPine-HCTZ 40-10-25 MG TABS Take 1 tablet by mouth every evening. 06/23/19  Yes Grayce SessionsEdwards, Michelle P, NP  ondansetron (ZOFRAN-ODT) 4 MG  disintegrating tablet Take 1 tablet (4 mg total) by mouth every 8 (eight) hours as needed for nausea or vomiting. 12/05/20  Yes Burky, Dorene GrebeNatalie B, NP  oxyCODONE (ROXICODONE) 15 MG immediate release tablet Take 15 mg by mouth 4 (four) times daily as needed for pain. 02/20/19  Yes [provider]  OZEMPIC, 1 MG/DOSE, 4 MG/3ML SOPN Inject 1 mg as directed once a week. 12/14/20  Yes [provider]  polyethylene glycol powder (GLYCOLAX/MIRALAX) 17 GM/SCOOP powder Take 17 g by mouth daily. 12/05/20  Yes Burky, Dorene GrebeNatalie B, NP  topiramate (TOPAMAX) 200 MG tablet Take 200 mg by mouth daily. 01/28/19  Yes [provider]  traZODone (DESYREL) 100 MG tablet Take 200 mg by mouth daily.  02/24/19  Yes [provider]  insulin glargine (LANTUS) 100 UNIT/ML  injection Use 40 units at bedtime and 40 units 12 hours after bedtime dose. Patient not taking: No sig reported 12/15/18   Loletta Specter, PA-C  Insulin Syringe-Needle U-100 (INSULIN SYRINGE 1CC/30GX1/2") 30G X 1/2" 1 ML MISC Inject 1 application into the skin 5 (five) times daily. Patient not taking: Reported on 12/16/2020 12/15/18   Loletta Specter, PA-C  lubiprostone (AMITIZA) 24 MCG capsule Take 1 capsule (24 mcg total) by mouth 2 (two) times daily with a meal. Patient not taking: No sig reported 05/30/19   Grayce Sessions, NP  metoCLOPramide (REGLAN) 10 MG tablet Take 1 tablet (10 mg total) by mouth every 6 (six) hours as needed for nausea (abdominal cramping). 12/16/20   Lorelee New, PA-C  sitaGLIPtin-metformin (JANUMET) 50-1000 MG tablet Take 1 tablet by mouth 2 (two) times daily with a meal. Patient not taking: No sig reported 12/15/18   Loletta Specter, PA-C  trimethoprim-polymyxin b [facility]) ophthalmic solution Place 1 drop into both eyes every 4 (four) hours. Patient not taking: No sig reported 12/08/19   Durward Parcel, FNP    Allergies    Lyrica [pregabalin], Rocephin [ceftriaxone sodium in  dextrose], Tramadol, Vancomycin, Glipizide, Keflex [cephalexin], Penicillins, Sulfa antibiotics, and Sitagliptin-metformin hcl  Review of Systems   Review of Systems  All other systems reviewed and are negative.   Physical Exam Updated Vital Signs BP (!) 225/127   Pulse (!) 106   Temp 98.5 F (36.9 C) (Oral)   Resp 17   Ht 5\' 2"  (1.575 m)   Wt 69.9 kg   SpO2 98%   BMI 28.17 kg/m   Physical Exam Vitals and nursing note reviewed. Exam conducted with a chaperone present.  Constitutional:      General: She is not in acute distress.    Appearance: Normal appearance. She is not ill-appearing.  HENT:     Head: Normocephalic and atraumatic.  Eyes:     General: No scleral icterus.    Conjunctiva/sclera: Conjunctivae normal.  Cardiovascular:     Rate and Rhythm: Normal rate.     Pulses: Normal pulses.  Pulmonary:     Effort: Pulmonary effort is normal.  Abdominal:     General: Abdomen is flat.     Palpations: Abdomen is soft.     Comments: Mildly protuberant, but no significant distention.  Soft.  RUQ and RLQ tenderness.  Positive Murphy sign.  No significant epigastric, LUQ, or LLQ abdominal TTP.  No overlying skin changes.  Normal BS.    Musculoskeletal:     Right lower leg: No edema.     Left lower leg: No edema.  Skin:    General: Skin is dry.     Capillary Refill: Capillary refill takes less than 2 seconds.  Neurological:     Mental Status: She is alert and oriented to person, place, and time.     GCS: GCS eye subscore is 4. GCS verbal subscore is 5. GCS motor subscore is 6.  Psychiatric:        Mood and Affect: Mood normal.        Behavior: Behavior normal.        Thought Content: Thought content normal.     ED Results / Procedures / Treatments   Labs (all labs ordered are listed, but only abnormal results are displayed) Labs Reviewed  COMPREHENSIVE METABOLIC PANEL - Abnormal; Notable for the following components:      Result Value   Glucose, Bld 205 (*)  Creatinine, Ser 1.17 (*)    GFR, Estimated 58 (*)    All other components within normal limits  CBC - Abnormal; Notable for the following components:   WBC 11.0 (*)    RDW 15.6 (*)    All other components within normal limits  URINALYSIS, ROUTINE W REFLEX MICROSCOPIC - Abnormal; Notable for the following components:   APPearance CLOUDY (*)    Glucose, UA 150 (*)    All other components within normal limits  LIPASE, BLOOD  I-STAT BETA HCG BLOOD, ED ([facility], [facility], [facility] ONLY)    EKG None  Radiology CT ABDOMEN PELVIS W CONTRAST  Result Date: 12/16/2020 CLINICAL DATA:  Mid abdominal pain for 1 week. EXAM: CT ABDOMEN AND PELVIS WITH CONTRAST TECHNIQUE: Multidetector CT imaging of the abdomen and pelvis was performed using the standard protocol following bolus administration of intravenous contrast. CONTRAST:  100 ML OMNIPAQUE IOHEXOL 300 MG/ML  SOLN COMPARISON:  None. FINDINGS: Lower chest: There is some dependent atelectasis. No pleural or pericardial effusion. Hepatobiliary: No focal liver abnormality is seen. No gallstones, gallbladder wall thickening, or biliary dilatation. Pancreas: Unremarkable. No pancreatic ductal dilatation or surrounding inflammatory changes. Spleen: Normal in size. Small focus of mildly decreased attenuation in the inferior aspect of the spleen on early imaging is not seen on delayed imaging and may be a hemangioma. Adrenals/Urinary Tract: The adrenal glands appear normal. There is some scarring in the upper pole the left kidney there are 2-3 small nonobstructing stones are identified. Small nonobstructing stone midpole right kidney also noted. No hydronephrosis on the right or left. Small right renal cyst is seen. Ureters and urinary bladder are unremarkable. Stomach/Bowel: Stomach is within normal limits. Appendix appears normal. No evidence of bowel wall thickening, distention, or inflammatory changes. There is a fairly large volume of stool in the ascending colon.  Vascular/Lymphatic: Aortic atherosclerosis. No enlarged abdominal or pelvic lymph nodes. Reproductive: Status post hysterectomy. No adnexal masses. Other: There is mild laxity of the anterior abdominal wall on a small fat containing umbilical hernia. Musculoskeletal: No acute or focal abnormality. IMPRESSION: No acute abnormality abdomen or pelvis. Mild laxity of the anterior abdominal wall and a small fat containing umbilical hernia. Small nonobstructing bilateral renal stones. Aortic Atherosclerosis (ICD10-I70.0). Electronically Signed   By: Drusilla Kanner M.D.   On: 12/16/2020 13:57   US Abdomen Limited  Result Date: 12/16/2020 CLINICAL DATA:  Right upper quadrant pain EXAM: ULTRASOUND ABDOMEN LIMITED RIGHT UPPER QUADRANT COMPARISON:  None. FINDINGS: Gallbladder: No gallstones or wall thickening visualized. No sonographic Murphy sign noted by sonographer. Common bile duct: Diameter: 5 mm Liver: No focal lesion identified. Within normal limits in parenchymal echogenicity. Portal vein is patent on color Doppler imaging with normal direction of blood flow towards the liver. Other: None. IMPRESSION: Normal study.  No cause for right upper quadrant pain identified. Electronically Signed   By: Gerome Sam III M.D   On: 12/16/2020 11:41    Procedures Procedures (including critical care time)  Medications Ordered in ED Medications  metoCLOPramide (REGLAN) injection 10 mg (10 mg Intravenous Given 12/16/20 1026)  iohexol (OMNIPAQUE) 300 MG/ML solution 100 mL (100 mLs Intravenous Contrast Given 12/16/20 1300)  hydrALAZINE (APRESOLINE) injection 10 mg (10 mg Intravenous Given 12/16/20 1421)    ED Course  I have reviewed the triage vital signs and the nursing notes.  Pertinent labs & imaging results that were available during my care of the patient were reviewed by me and considered in my medical decision making (  see chart for details).    MDM Rules/Calculators/A&P                           Will obtain laboratory work-up and limited abdominal US of RUQ.  Will provide IV Reglan for her nausea and abdominal discomfort.  No personal history of diabetic gastroparesis.    Labs CBC: Leukocytosis to 11.0, however improved when compared to labs obtained 11 days ago. CMP: Mild hyperglycemia to 205.  Mild worsening in her creatinine and GFR, GFR increased to 1.17 compared to 0.81 recently. I-STAT beta-hCG: Less than 5. UA: Glucosuria, but no evidence of infection. Lipase: Within normal limits.  Imaging Limited right upper quadrant ultrasound is ordered given her report of early satiety, nausea and emesis, and abdominal pain worse with consumption of food.  She was also medically tender in the right upper quadrant on my exam, concern for gallbladder etiology.  However ultrasound is personally reviewed and demonstrates no biliary disease. I reviewed patient's medical record and she has not had a CT of her abdomen pelvis previously.  She states that she does not know if she is still passing gas.  Risk factor includes hysterectomy.  While this may be related to gastroparesis per initial suspicion, will obtain CT abdomen pelvis to assess for obstruction as cause of her symptoms.  CT abdomen pelvis reveals a fairly large amount of stool in the ascending colon, consistent with her area of discomfort, however no obstruction or other acute intra-abdominal pathology.  Imaging and work-up today supports suspicion for gastroparesis.  We will discharge her home with Reglan encouraged her to follow-up with her primary care provider for ongoing evaluation and management.  ED return precautions discussed.  Patient voices understanding and is agreeable to the plan.  Patient with elevated BP today. Unlikely to be related to CC and patient is without symptoms concerning for end organ damage. I discussed with patient their elevated blood pressure and need for close outpatient management of their hypertension.  Patient counseled on long-term effects of elevated BP including kidney damage, vascular damage, retinopathy, and risk of stroke and other dangerous outcomes. Patient understanding of close PCP follow up.  During her encounter at urgent care on 12/05/2020, her BP was noted to be 185/115. Her elevated BP here is not a significant deviation.  She denies any CP, SOB, palpitations, or blurred vision.    Given elevation to 225/127, will administer IV hydralazine to bring her down a little, but do not want to drop her too much or even aim for normal limits given suspicion that this could be her baseline and want her to slowly trend it down.    Final Clinical Impression(s) / ED Diagnoses Final diagnoses:  Gastroparesis due to DM (HCC)  Hypertension, unspecified type    Rx / DC Orders ED Discharge Orders         Ordered    metoCLOPramide (REGLAN) 10 MG tablet  Every 6 hours PRN        12/16/20 1423           Elvera Maria 12/16/20 1424    Cathren Laine, MD 12/17/20 [phone]

## 2020-12-16 NOTE — ED Notes (Signed)
PA made aware of increasing BP.

## 2020-12-16 NOTE — ED Triage Notes (Signed)
Pt s/o RUQ pain and constipation, seen at UC last week and drank Citrate of Mag without any results. Only drank one bottle.

## 2021-06-24 ENCOUNTER — Encounter (HOSPITAL_COMMUNITY): Payer: Self-pay

## 2021-06-24 ENCOUNTER — Other Ambulatory Visit: Payer: Self-pay

## 2021-06-24 ENCOUNTER — Ambulatory Visit (HOSPITAL_COMMUNITY)
Admission: EM | Admit: 2021-06-24 | Discharge: 2021-06-24 | Disposition: A | Discharge status: Home / Self Care | Payer: Medicare HMO | Attending: Medical Oncology | Admitting: Medical Oncology

## 2021-06-24 DIAGNOSIS — M25571 Pain in right ankle and joints of right foot: Secondary | ICD-10-CM | POA: Diagnosis not present

## 2021-06-24 MED ORDER — NAPROXEN 500 MG PO TABS: 500.0000 mg | ORAL_TABLET | Freq: Two times a day (BID) | ORAL | 0 refills | Status: AC

## 2021-06-24 NOTE — ED Triage Notes (Signed)
Pt present right foot pain, pt states when she got up out of bed this am, she stepped the wrong way and felt a pop in her foot. Since then her foot has been hurting and has some swelling.

## 2021-06-24 NOTE — ED Provider Notes (Addendum)
[facility]    CSN: [id number] Arrival date & time: 06/24/21  1658      History   Chief Complaint Chief Complaint  Patient presents with   Foot Pain    Right foot pain     HPI Stephanie Thornton is a 50 y.o. female.   HPI  Foot Pain: Pt reports that she got out of her bed today and stepped weird. She states that she heard a pop and then felt pain of her right lateral ankle. Tylenol is not helping. She has had swelling of the ankle but no loss of sensation or skin breakdown. She is ambulating slowly on the ankle. NO history of ankle injury.   Past Medical History:  Diagnosis Date   Anxiety    Arthritis    "knees, feet, back" (05/24/2015)   Benign essential HTN 05/01/2015   Bipolar disorder (HCC) diagnosed early 90s   Chronic bronchitis (HCC)    "get it q yr"   Chronic lower back pain    Depression    Diabetes mellitus type II 2010   GERD (gastroesophageal reflux disease)    Headache    "maybe 3 times/wk" (05/24/2015)   History of hiatal hernia    History of stomach ulcers    Menopause    Migraine    "1-2/wk" (05/24/2015)   OSA on CPAP     Patient Active Problem List   Diagnosis Date Noted   Unstable angina (HCC)    Essential hypertension    Depression    Diabetes type 2, controlled (HCC)    Chronic tonsillitis 05/24/2015   Abnormal EKG 05/01/2015   Preoperative clearance 05/01/2015   Hx of peritonsillar abscess drainage 01/03/2015   DM (diabetes mellitus) (HCC) 09/10/2013   Hyperosmolar non-ketotic state in patient with type 2 diabetes mellitus (HCC) 09/10/2013   Nausea & vomiting 09/10/2013   Chest pain 09/10/2013   Bipolar disorder (HCC)    Bipolar I disorder, most recent episode (or current) depressed, severe, without mention of psychotic behavior 11/26/2011    Past Surgical History:  Procedure Laterality Date   ABDOMINAL HYSTERECTOMY  2007   BUNIONECTOMY Bilateral    CARPAL TUNNEL RELEASE Right    HAMMER TOE SURGERY Left    LEFT HEART  CATH AND CORONARY ANGIOGRAPHY N/A 05/20/2018   Procedure: LEFT HEART CATH AND CORONARY ANGIOGRAPHY;  Surgeon: Marykay Lex, MD;  Location: [facility] INVASIVE CV LAB;  Service: Cardiovascular;  Laterality: N/A;   TONSILLECTOMY Bilateral 05/24/2015   Procedure: TONSILLECTOMY;  Surgeon: Christia Reading, MD;  Location: [facility] OR;  Service: ENT;  Laterality: Bilateral;    OB History   No obstetric history on file.      Home Medications    Prior to Admission medications   Medication Sig Start Date End Date Taking? Authorizing Provider  albuterol (PROVENTIL HFA;VENTOLIN HFA) 108 (90 Base) MCG/ACT inhaler Inhale 1-2 puffs into the lungs every 6 (six) hours as needed for wheezing or shortness of breath. 03/04/19   Grayce Sessions, NP  amitriptyline (ELAVIL) 150 MG tablet Take 150 mg by mouth daily. 01/28/19   [provider]  aspirin 81 MG EC tablet Take 1 tablet (81 mg total) by mouth daily. 12/15/18   Loletta Specter, PA-C  busPIRone (BUSPAR) 10 MG tablet Take 10 mg by mouth 3 (three) times daily. 01/25/19   [provider]  FARXIGA 10 MG TABS tablet Take 20 mg by mouth daily. 09/19/20   [provider]  gabapentin (NEURONTIN)  300 MG capsule Take 1 capsule (300 mg total) by mouth 3 (three) times daily. 06/29/19   Grayce Sessions, NP  insulin glargine (LANTUS) 100 UNIT/ML injection Use 40 units at bedtime and 40 units 12 hours after bedtime dose. Patient not taking: No sig reported 12/15/18   Loletta Specter, PA-C  Insulin Syringe-Needle U-100 (INSULIN SYRINGE 1CC/30GX1/2") 30G X 1/2" 1 ML MISC Inject 1 application into the skin 5 (five) times daily. Patient not taking: Reported on 12/16/2020 12/15/18   Loletta Specter, PA-C  LATUDA 80 MG TABS tablet Take 80 mg by mouth daily. 02/01/19   [provider]  LINZESS 72 MCG capsule Take 72 mcg by mouth daily before breakfast. 10/09/20   [provider]  lovastatin (MEVACOR) 40 MG tablet Take 1 tablet (40 mg  total) by mouth at bedtime. 06/01/19   Hoy Register, MD  lubiprostone (AMITIZA) 24 MCG capsule Take 1 capsule (24 mcg total) by mouth 2 (two) times daily with a meal. Patient not taking: No sig reported 05/30/19   Grayce Sessions, NP  metoCLOPramide (REGLAN) 10 MG tablet Take 1 tablet (10 mg total) by mouth every 6 (six) hours as needed for nausea (abdominal cramping). 12/16/20   Lorelee New, PA-C  Multiple Vitamin (MULTI-VITAMINS) TABS Take 1 tablet by mouth daily. 12/15/18   Loletta Specter, PA-C  NOVOLOG 100 UNIT/ML injection Inject 0.5-30 Units into the skin daily. 11/21/20   [provider]  Olmesartan-amLODIPine-HCTZ 40-10-25 MG TABS Take 1 tablet by mouth every evening. 06/23/19   Grayce Sessions, NP  ondansetron (ZOFRAN-ODT) 4 MG disintegrating tablet Take 1 tablet (4 mg total) by mouth every 8 (eight) hours as needed for nausea or vomiting. 12/05/20   Georgetta Haber, NP  oxyCODONE (ROXICODONE) 15 MG immediate release tablet Take 15 mg by mouth 4 (four) times daily as needed for pain. 02/20/19   [provider]  OZEMPIC, 1 MG/DOSE, 4 MG/3ML SOPN Inject 1 mg as directed once a week. 12/14/20   [provider]  polyethylene glycol powder (GLYCOLAX/MIRALAX) 17 GM/SCOOP powder Take 17 g by mouth daily. 12/05/20   Georgetta Haber, NP  sitaGLIPtin-metformin (JANUMET) 50-1000 MG tablet Take 1 tablet by mouth 2 (two) times daily with a meal. Patient not taking: No sig reported 12/15/18   Loletta Specter, PA-C  topiramate (TOPAMAX) 200 MG tablet Take 200 mg by mouth daily. 01/28/19   [provider]  traZODone (DESYREL) 100 MG tablet Take 200 mg by mouth daily.  02/24/19   [provider]  trimethoprim-polymyxin b (POLYTRIM) ophthalmic solution Place 1 drop into both eyes every 4 (four) hours. Patient not taking: No sig reported 12/08/19   Durward Parcel, FNP    Family History Family History  Problem Relation Age of Onset   Bipolar  disorder Mother    CVA Mother    Bipolar disorder Son    Depression Brother        schizoaffective d/o   Hypertension Sister    Heart disease Maternal Aunt    Heart attack Maternal Aunt    Hypertension Brother     Social History Social History   Tobacco Use   Smoking status: Some Days    Packs/day: 1.00    Years: 30.00    Pack years: 30.00    Types: Cigarettes   Smokeless tobacco: Never  Vaping Use   Vaping Use: Never used  Substance Use Topics   Alcohol use: No  Drug use: No     Allergies   Lyrica [pregabalin], Rocephin [ceftriaxone sodium in dextrose], Tramadol, Vancomycin, Glipizide, Keflex [cephalexin], Penicillins, Sulfa antibiotics, and Sitagliptin-metformin hcl   Review of Systems Review of Systems  As stated above in HPI Physical Exam Triage Vital Signs ED Triage Vitals  Enc Vitals Group     BP 06/24/21 1708 (!) 170/110     Pulse Rate 06/24/21 1708 (!) 108     Resp 06/24/21 1708 18     Temp 06/24/21 1708 98.6 F (37 C)     Temp Source 06/24/21 1708 Oral     SpO2 06/24/21 1708 97 %     Weight --      Height --      Head Circumference --      Peak Flow --      Pain Score 06/24/21 1710 5     Pain Loc --      Pain Edu? --      Excl. in GC? --    No data found.  Updated Vital Signs BP (!) 170/110 (BP Location: Right Arm)   Pulse (!) 108   Temp 98.6 F (37 C) (Oral)   Resp 18   SpO2 97%    Physical Exam Vitals and nursing note reviewed.  Constitutional:      General: She is not in acute distress.    Appearance: Normal appearance. She is not ill-appearing, toxic-appearing or diaphoretic.  Cardiovascular:     Pulses: Normal pulses.  Musculoskeletal:        General: Swelling (mild lateral swelling of the right ankle with moderate tenderness to palpation) and tenderness (Negative fracture testing bilaterally) present.     Right lower leg: No edema.     Left lower leg: No edema.  Skin:    General: Skin is warm.     Capillary Refill:  Capillary refill takes less than 2 seconds.     Coloration: Skin is not jaundiced.     Findings: No bruising.  Neurological:     Mental Status: She is alert.     UC Treatments / Results  Labs (all labs ordered are listed, but only abnormal results are displayed) Labs Reviewed - No data to display  EKG   Radiology No results found.  Procedures Procedures (including critical care time)  Medications Ordered in UC Medications - No data to display  Initial Impression / Assessment and Plan / UC Course  I have reviewed the triage vital signs and the nursing notes.  Pertinent labs & imaging results that were available during my care of the patient were reviewed by me and considered in my medical decision making (see chart for details).     New. Appears to be a sprain. Pt defers x ray imaging. CAM boot and discussed RICE. Naproxen sparingly as needed for severe pain as she is allergic to tramadol and tylenol 3. Discussed red flag signs and symptoms. She will go home and take her BP medication which she reports she has not taken yet today to help lower her BP.  Final Clinical Impressions(s) / UC Diagnoses   Final diagnoses:  None   Discharge Instructions   None    ED Prescriptions   None    PDMP not reviewed this encounter.   Rushie Chestnut, Cordelia Poche 06/24/21 1733    Rushie Chestnut, PA-C 06/24/21 1736    Rushie Chestnut, PA-C 06/24/21 1742

## 2021-08-01 ENCOUNTER — Ambulatory Visit (HOSPITAL_COMMUNITY)
Admission: EM | Admit: 2021-08-01 | Discharge: 2021-08-01 | Disposition: A | Discharge status: Home / Self Care | Payer: Medicare HMO | Attending: Internal Medicine | Admitting: Internal Medicine

## 2021-08-01 ENCOUNTER — Encounter (HOSPITAL_COMMUNITY): Payer: Self-pay

## 2021-08-01 ENCOUNTER — Other Ambulatory Visit: Payer: Self-pay

## 2021-08-01 DIAGNOSIS — H04322 Acute dacryocystitis of left lacrimal passage: Secondary | ICD-10-CM

## 2021-08-01 DIAGNOSIS — R220 Localized swelling, mass and lump, head: Secondary | ICD-10-CM

## 2021-08-01 DIAGNOSIS — R519 Headache, unspecified: Secondary | ICD-10-CM | POA: Diagnosis not present

## 2021-08-01 MED ORDER — ERYTHROMYCIN 5 MG/GM OP OINT: TOPICAL_OINTMENT | OPHTHALMIC | 0 refills | Status: DC

## 2021-08-01 MED ORDER — DOXYCYCLINE HYCLATE 100 MG PO CAPS: 100.0000 mg | ORAL_CAPSULE | Freq: Two times a day (BID) | ORAL | 0 refills | Status: AC

## 2021-08-01 NOTE — ED Provider Notes (Addendum)
[facility]    CSN: [id number] Arrival date & time: 08/01/21  1244      History   Chief Complaint Chief Complaint  Patient presents with   Facial Swelling    HPI Stephanie Thornton is a 50 y.o. female.   Presents to the urgent care with 2-day history of left-sided facial swelling and pain.  Has taken Tylenol with no relief.  Denies any known fevers but states she has had mild nasal congestion.  Denies any known sick contacts.  Denies any dizziness, nausea, vomiting, blurry vision.  States that she has had some purulent drainage coming from the left eye that started today.  Denies any itchiness or pain to left eye.  Patient also has elevated blood pressure on original triage.  Denies any headache, blurry vision, nausea, vomiting, dizziness, chest pain.  States that she has not yet taken her blood pressure medication today.  Patient has home blood pressure cuff and normal systolic blood pressure is typically around 140.  Patient took blood pressure yesterday and it was SBP of 140.    Past Medical History:  Diagnosis Date   Anxiety    Arthritis    "knees, feet, back" (05/24/2015)   Benign essential HTN 05/01/2015   Bipolar disorder (HCC) diagnosed early 90s   Chronic bronchitis (HCC)    "get it q yr"   Chronic lower back pain    Depression    Diabetes mellitus type II 2010   GERD (gastroesophageal reflux disease)    Headache    "maybe 3 times/wk" (05/24/2015)   History of hiatal hernia    History of stomach ulcers    Menopause    Migraine    "1-2/wk" (05/24/2015)   OSA on CPAP     Patient Active Problem List   Diagnosis Date Noted   Unstable angina (HCC)    Essential hypertension    Depression    Diabetes type 2, controlled (HCC)    Chronic tonsillitis 05/24/2015   Abnormal EKG 05/01/2015   Preoperative clearance 05/01/2015   Hx of peritonsillar abscess drainage 01/03/2015   DM (diabetes mellitus) (HCC) 09/10/2013   Hyperosmolar non-ketotic state in patient  with type 2 diabetes mellitus (HCC) 09/10/2013   Nausea & vomiting 09/10/2013   Chest pain 09/10/2013   Bipolar disorder (HCC)    Bipolar I disorder, most recent episode (or current) depressed, severe, without mention of psychotic behavior 11/26/2011    Past Surgical History:  Procedure Laterality Date   ABDOMINAL HYSTERECTOMY  2007   BUNIONECTOMY Bilateral    CARPAL TUNNEL RELEASE Right    HAMMER TOE SURGERY Left    LEFT HEART CATH AND CORONARY ANGIOGRAPHY N/A 05/20/2018   Procedure: LEFT HEART CATH AND CORONARY ANGIOGRAPHY;  Surgeon: Marykay Lex, MD;  Location: [facility] INVASIVE CV LAB;  Service: Cardiovascular;  Laterality: N/A;   TONSILLECTOMY Bilateral 05/24/2015   Procedure: TONSILLECTOMY;  Surgeon: Christia Reading, MD;  Location: [facility] OR;  Service: ENT;  Laterality: Bilateral;    OB History   No obstetric history on file.      Home Medications    Prior to Admission medications   Medication Sig Start Date End Date Taking? Authorizing Provider  doxycycline (VIBRAMYCIN) 100 MG capsule Take 1 capsule (100 mg total) by mouth 2 (two) times daily for 10 days. 08/01/21 08/11/21 Yes Lance Muss, FNP  erythromycin ophthalmic ointment Place a 1/2 inch ribbon of ointment into the lower eyelid four times daily for 7 days. 08/01/21  Yes Lance MussFowler, Marina Boerner E, FNP  albuterol (PROVENTIL HFA;VENTOLIN HFA) 108 (90 Base) MCG/ACT inhaler Inhale 1-2 puffs into the lungs every 6 (six) hours as needed for wheezing or shortness of breath. 03/04/19   Grayce SessionsEdwards, Michelle P, NP  amitriptyline (ELAVIL) 150 MG tablet Take 150 mg by mouth daily. 01/28/19   [provider]  aspirin 81 MG EC tablet Take 1 tablet (81 mg total) by mouth daily. 12/15/18   Loletta SpecterGomez, Roger David, PA-C  busPIRone (BUSPAR) 10 MG tablet Take 10 mg by mouth 3 (three) times daily. 01/25/19   [provider]  FARXIGA 10 MG TABS tablet Take 20 mg by mouth daily. 09/19/20   [provider]  gabapentin (NEURONTIN) 300 MG capsule Take  1 capsule (300 mg total) by mouth 3 (three) times daily. 06/29/19   Grayce SessionsEdwards, Michelle P, NP  insulin glargine (LANTUS) 100 UNIT/ML injection Use 40 units at bedtime and 40 units 12 hours after bedtime dose. Patient not taking: No sig reported 12/15/18   Loletta SpecterGomez, Roger David, PA-C  Insulin Syringe-Needle U-100 (INSULIN SYRINGE 1CC/30GX1/2") 30G X 1/2" 1 ML MISC Inject 1 application into the skin 5 (five) times daily. Patient not taking: No sig reported 12/15/18   Loletta SpecterGomez, Roger David, PA-C  LATUDA 80 MG TABS tablet Take 80 mg by mouth daily. 02/01/19   [provider]  LINZESS 72 MCG capsule Take 72 mcg by mouth daily before breakfast. 10/09/20   [provider]  lovastatin (MEVACOR) 40 MG tablet Take 1 tablet (40 mg total) by mouth at bedtime. 06/01/19   Hoy RegisterNewlin, Enobong, MD  lubiprostone (AMITIZA) 24 MCG capsule Take 1 capsule (24 mcg total) by mouth 2 (two) times daily with a meal. Patient not taking: No sig reported 05/30/19   Grayce SessionsEdwards, Michelle P, NP  metoCLOPramide (REGLAN) 10 MG tablet Take 1 tablet (10 mg total) by mouth every 6 (six) hours as needed for nausea (abdominal cramping). 12/16/20   Lorelee NewGreen, Garrett L, PA-C  Multiple Vitamin (MULTI-VITAMINS) TABS Take 1 tablet by mouth daily. 12/15/18   Loletta SpecterGomez, Roger David, PA-C  NOVOLOG 100 UNIT/ML injection Inject 0.5-30 Units into the skin daily. 11/21/20   [provider]  Olmesartan-amLODIPine-HCTZ 40-10-25 MG TABS Take 1 tablet by mouth every evening. 06/23/19   Grayce SessionsEdwards, Michelle P, NP  ondansetron (ZOFRAN-ODT) 4 MG disintegrating tablet Take 1 tablet (4 mg total) by mouth every 8 (eight) hours as needed for nausea or vomiting. 12/05/20   Georgetta HaberBurky, Natalie B, NP  oxyCODONE (ROXICODONE) 15 MG immediate release tablet Take 15 mg by mouth 4 (four) times daily as needed for pain. 02/20/19   [provider]  OZEMPIC, 1 MG/DOSE, 4 MG/3ML SOPN Inject 1 mg as directed once a week. 12/14/20   [provider]  polyethylene  glycol powder (GLYCOLAX/MIRALAX) 17 GM/SCOOP powder Take 17 g by mouth daily. 12/05/20   Georgetta HaberBurky, Natalie B, NP  sitaGLIPtin-metformin (JANUMET) 50-1000 MG tablet Take 1 tablet by mouth 2 (two) times daily with a meal. Patient not taking: No sig reported 12/15/18   Loletta SpecterGomez, Roger David, PA-C  topiramate (TOPAMAX) 200 MG tablet Take 200 mg by mouth daily. 01/28/19   [provider]  traZODone (DESYREL) 100 MG tablet Take 200 mg by mouth daily.  02/24/19   [provider]  trimethoprim-polymyxin b (POLYTRIM) ophthalmic solution Place 1 drop into both eyes every 4 (four) hours. Patient not taking: No sig reported 12/08/19   Durward ParcelAvegno, Komlanvi S, FNP    Family History Family History  Problem Relation Age of Onset   Bipolar disorder Mother    CVA Mother    Bipolar disorder Son    Depression Brother        schizoaffective d/o   Hypertension Sister    Heart disease Maternal Aunt    Heart attack Maternal Aunt    Hypertension Brother     Social History Social History   Tobacco Use   Smoking status: Some Days    Packs/day: 1.00    Years: 30.00    Pack years: 30.00    Types: Cigarettes   Smokeless tobacco: Never  Vaping Use   Vaping Use: Never used  Substance Use Topics   Alcohol use: No   Drug use: No     Allergies   Lyrica [pregabalin], Rocephin [ceftriaxone sodium in dextrose], Tramadol, Vancomycin, Glipizide, Keflex [cephalexin], Penicillins, Sulfa antibiotics, and Sitagliptin-metformin hcl   Review of Systems Review of Systems Per HPI  Physical Exam Triage Vital Signs ED Triage Vitals  Enc Vitals Group     BP 08/01/21 1309 (!) 197/82     Pulse Rate 08/01/21 1309 91     Resp 08/01/21 1309 18     Temp 08/01/21 1309 98.9 F (37.2 C)     Temp Source 08/01/21 1309 Oral     SpO2 08/01/21 1309 96 %     Weight --      Height --      Head Circumference --      Peak Flow --      Pain Score 08/01/21 1307 10     Pain Loc --      Pain Edu? --      Excl. in GC?  --    No data found.  Updated Vital Signs BP (!) 145/91   Pulse 91   Temp 98.9 F (37.2 C) (Oral)   Resp 18   SpO2 96%   Visual Acuity Right Eye Distance:   Left Eye Distance:   Bilateral Distance:    Right Eye Near:   Left Eye Near:    Bilateral Near:     Physical Exam Constitutional:      General: She is not in acute distress. HENT:     Head: Normocephalic.     Comments: Tenderness to palpation and facial swelling located to area directly over lacrimal sac of left eye and directly below left eye.    Nose: Congestion present.     Mouth/Throat:     Dentition: No gingival swelling or dental abscesses.     Pharynx: No posterior oropharyngeal erythema.  Eyes:     General: Lids are normal.     Conjunctiva/sclera: Conjunctivae normal.  Cardiovascular:     Rate and Rhythm: Normal rate and regular rhythm.     Pulses: Normal pulses.     Heart sounds: Normal heart sounds.  Pulmonary:     Effort: Pulmonary effort is normal.     Breath sounds: Normal breath sounds.  Skin:    General: Skin is warm and dry.  Neurological:     General: No focal deficit present.     Mental Status: She is alert and oriented to person, place, and time. Mental status is at baseline.  Psychiatric:        Mood and Affect: Mood normal.        Behavior: Behavior normal.        Thought Content: Thought content normal.        Judgment: Judgment normal.  UC Treatments / Results  Labs (all labs ordered are listed, but only abnormal results are displayed) Labs Reviewed - No data to display  EKG   Radiology No results found.  Procedures Procedures (including critical care time)  Medications Ordered in UC Medications - No data to display  Initial Impression / Assessment and Plan / UC Course  I have reviewed the triage vital signs and the nursing notes.  Pertinent labs & imaging results that were available during my care of the patient were reviewed by me and considered in my medical  decision making (see chart for details).     Symptoms most consistent with acute dacryocystitis of left lacrimal sac.  Doxycycline x10 days and erythromycin antibiotic ointment prescribed to treat this.  Patient is nontoxic-appearing and do not suspect periorbital cellulitis at this time.  Advised patient to go to the hospital if symptoms and pain do not improve in the next 24 to 48 hours with antibiotic treatment. Blood pressure recheck was 140 systolic.  Advised patient to go home and take her blood pressure medication and to take blood pressure daily with home blood pressure cuff.  Follow-up with PCP if blood pressure continues to be elevated.  Advised patient go to the hospital if blood pressure continues to be significantly elevated. Discussed strict return precautions. Patient verbalized understanding and is agreeable with plan.  Final Clinical Impressions(s) / UC Diagnoses   Final diagnoses:  Acute dacryocystitis of left lacrimal sac  Facial swelling  Facial pain     Discharge Instructions      You have an infection and inflammation of the lacrimal sac of your left eye.  Please take doxycycline antibiotic pill and erythromycin antibiotic ointment as prescribed.  Please go to the hospital if symptoms worsen or do not improve in the next few days.     ED Prescriptions     Medication Sig Dispense Auth. Provider   doxycycline (VIBRAMYCIN) 100 MG capsule Take 1 capsule (100 mg total) by mouth 2 (two) times daily for 10 days. 20 capsule Lance Muss, FNP   erythromycin ophthalmic ointment Place a 1/2 inch ribbon of ointment into the lower eyelid four times daily for 7 days. 3.5 g Lance Muss, FNP      PDMP not reviewed this encounter.   Lance Muss, FNP 08/01/21 1500    Lance Muss, FNP 08/01/21 [phone]

## 2021-08-01 NOTE — Discharge Instructions (Addendum)
You have an infection and inflammation of the lacrimal sac of your left eye.  Please take doxycycline antibiotic pill and erythromycin antibiotic ointment as prescribed.  Please go to the hospital if symptoms worsen or do not improve in the next few days.

## 2021-08-01 NOTE — ED Triage Notes (Signed)
Pt reports left sided facial; swelling x 2 days. Tylenol gives no relief.

## 2022-01-08 ENCOUNTER — Ambulatory Visit: Payer: Medicare HMO | Admitting: Podiatry

## 2022-01-10 ENCOUNTER — Encounter (INDEPENDENT_AMBULATORY_CARE_PROVIDER_SITE_OTHER): Payer: Self-pay | Admitting: Podiatry

## 2022-01-10 DIAGNOSIS — M2041 Other hammer toe(s) (acquired), right foot: Secondary | ICD-10-CM

## 2022-01-10 NOTE — Progress Notes (Signed)
This encounter was created in error - please disregard.

## 2022-04-29 DIAGNOSIS — I214 Non-ST elevation (NSTEMI) myocardial infarction: Secondary | ICD-10-CM

## 2022-04-29 HISTORY — DX: Non-ST elevation (NSTEMI) myocardial infarction: I21.4

## 2022-05-06 ENCOUNTER — Encounter (HOSPITAL_COMMUNITY): Payer: Self-pay

## 2022-05-06 ENCOUNTER — Inpatient Hospital Stay (HOSPITAL_COMMUNITY)
Admission: EM | Admit: 2022-05-06 | Discharge: 2022-05-09 | DRG: 247 | Disposition: A | Discharge status: Home / Self Care | Payer: Medicare HMO | Attending: Cardiology | Admitting: Cardiology

## 2022-05-06 ENCOUNTER — Emergency Department (HOSPITAL_COMMUNITY): Payer: Medicare HMO

## 2022-05-06 ENCOUNTER — Other Ambulatory Visit: Payer: Self-pay

## 2022-05-06 DIAGNOSIS — Z7982 Long term (current) use of aspirin: Secondary | ICD-10-CM

## 2022-05-06 DIAGNOSIS — E1142 Type 2 diabetes mellitus with diabetic polyneuropathy: Secondary | ICD-10-CM | POA: Diagnosis present

## 2022-05-06 DIAGNOSIS — I214 Non-ST elevation (NSTEMI) myocardial infarction: Principal | ICD-10-CM | POA: Diagnosis present

## 2022-05-06 DIAGNOSIS — K589 Irritable bowel syndrome without diarrhea: Secondary | ICD-10-CM | POA: Diagnosis present

## 2022-05-06 DIAGNOSIS — E876 Hypokalemia: Secondary | ICD-10-CM | POA: Diagnosis present

## 2022-05-06 DIAGNOSIS — F319 Bipolar disorder, unspecified: Secondary | ICD-10-CM | POA: Diagnosis present

## 2022-05-06 DIAGNOSIS — I249 Acute ischemic heart disease, unspecified: Secondary | ICD-10-CM

## 2022-05-06 DIAGNOSIS — E119 Type 2 diabetes mellitus without complications: Secondary | ICD-10-CM

## 2022-05-06 DIAGNOSIS — Z888 Allergy status to other drugs, medicaments and biological substances status: Secondary | ICD-10-CM

## 2022-05-06 DIAGNOSIS — Z8249 Family history of ischemic heart disease and other diseases of the circulatory system: Secondary | ICD-10-CM

## 2022-05-06 DIAGNOSIS — E7800 Pure hypercholesterolemia, unspecified: Secondary | ICD-10-CM | POA: Diagnosis present

## 2022-05-06 DIAGNOSIS — Z79899 Other long term (current) drug therapy: Secondary | ICD-10-CM

## 2022-05-06 DIAGNOSIS — Z955 Presence of coronary angioplasty implant and graft: Secondary | ICD-10-CM

## 2022-05-06 DIAGNOSIS — F17210 Nicotine dependence, cigarettes, uncomplicated: Secondary | ICD-10-CM | POA: Diagnosis present

## 2022-05-06 DIAGNOSIS — Z882 Allergy status to sulfonamides status: Secondary | ICD-10-CM

## 2022-05-06 DIAGNOSIS — I10 Essential (primary) hypertension: Secondary | ICD-10-CM | POA: Diagnosis present

## 2022-05-06 DIAGNOSIS — K219 Gastro-esophageal reflux disease without esophagitis: Secondary | ICD-10-CM | POA: Diagnosis present

## 2022-05-06 DIAGNOSIS — Z885 Allergy status to narcotic agent status: Secondary | ICD-10-CM

## 2022-05-06 DIAGNOSIS — J45909 Unspecified asthma, uncomplicated: Secondary | ICD-10-CM | POA: Diagnosis present

## 2022-05-06 DIAGNOSIS — Z79891 Long term (current) use of opiate analgesic: Secondary | ICD-10-CM

## 2022-05-06 DIAGNOSIS — I25110 Atherosclerotic heart disease of native coronary artery with unstable angina pectoris: Secondary | ICD-10-CM | POA: Diagnosis present

## 2022-05-06 DIAGNOSIS — I200 Unstable angina: Secondary | ICD-10-CM | POA: Diagnosis present

## 2022-05-06 DIAGNOSIS — G4733 Obstructive sleep apnea (adult) (pediatric): Secondary | ICD-10-CM | POA: Diagnosis present

## 2022-05-06 DIAGNOSIS — I152 Hypertension secondary to endocrine disorders: Secondary | ICD-10-CM | POA: Diagnosis present

## 2022-05-06 DIAGNOSIS — Z794 Long term (current) use of insulin: Secondary | ICD-10-CM

## 2022-05-06 DIAGNOSIS — Z880 Allergy status to penicillin: Secondary | ICD-10-CM

## 2022-05-06 DIAGNOSIS — E1165 Type 2 diabetes mellitus with hyperglycemia: Secondary | ICD-10-CM | POA: Diagnosis present

## 2022-05-06 DIAGNOSIS — G8929 Other chronic pain: Secondary | ICD-10-CM | POA: Diagnosis present

## 2022-05-06 LAB — CBC
HCT: 39.1 % (ref 36.0–46.0)
Hemoglobin: 12.9 g/dL (ref 12.0–15.0)
MCH: 27.6 pg (ref 26.0–34.0)
MCHC: 33 g/dL (ref 30.0–36.0)
MCV: 83.5 fL (ref 80.0–100.0)
Platelets: 217 10*3/uL (ref 150–400)
RBC: 4.68 MIL/uL (ref 3.87–5.11)
RDW: 14.1 % (ref 11.5–15.5)
WBC: 11.6 10*3/uL — ABNORMAL HIGH (ref 4.0–10.5)
nRBC: 0 % (ref 0.0–0.2)

## 2022-05-06 LAB — BASIC METABOLIC PANEL
Anion gap: 9 (ref 5–15)
BUN: 6 mg/dL (ref 6–20)
CO2: 22 mmol/L (ref 22–32)
Calcium: 9.1 mg/dL (ref 8.9–10.3)
Chloride: 107 mmol/L (ref 98–111)
Creatinine, Ser: 0.87 mg/dL (ref 0.44–1.00)
GFR, Estimated: >60 mL/min (ref >60)
Glucose, Bld: 127 mg/dL — ABNORMAL HIGH (ref 70–99)
Potassium: 3 mmol/L — ABNORMAL LOW (ref 3.5–5.1)
Sodium: 138 mmol/L (ref 135–145)

## 2022-05-06 LAB — TROPONIN I (HIGH SENSITIVITY): Troponin I (High Sensitivity): 7600 ng/L (ref <18)

## 2022-05-06 LAB — PROTIME-INR
INR: 1 (ref 0.8–1.2)
Prothrombin Time: 13.4 seconds (ref 11.4–15.2)

## 2022-05-06 LAB — I-STAT BETA HCG BLOOD, ED (MC, WL, AP ONLY): I-stat hCG, quantitative: 7.2 m[IU]/mL — ABNORMAL HIGH (ref <5)

## 2022-05-06 MED ORDER — NITROGLYCERIN 0.4 MG SL SUBL
0.4000 mg | SUBLINGUAL_TABLET | SUBLINGUAL | Status: DC | PRN
  Administered 2022-05-06 (×3): 0.4 mg via SUBLINGUAL
  Filled 2022-05-06 (×4): qty 1

## 2022-05-06 MED ORDER — FENTANYL CITRATE PF 50 MCG/ML IJ SOSY
100.0000 ug | PREFILLED_SYRINGE | Freq: Once | INTRAMUSCULAR | Status: AC
  Administered 2022-05-06: 100 ug via INTRAVENOUS
  Filled 2022-05-06: qty 2

## 2022-05-06 MED ORDER — NITROGLYCERIN IN D5W 200-5 MCG/ML-% IV SOLN
0.0000 ug/min | INTRAVENOUS | Status: DC
  Administered 2022-05-06: 5 ug/min via INTRAVENOUS
  Administered 2022-05-07 (×2): 200 ug/min via INTRAVENOUS
  Filled 2022-05-06 (×3): qty 250

## 2022-05-06 MED ORDER — POTASSIUM CHLORIDE CRYS ER 20 MEQ PO TBCR
40.0000 meq | EXTENDED_RELEASE_TABLET | Freq: Once | ORAL | Status: AC
Start: 2022-05-06 — End: 2022-05-06
  Administered 2022-05-06: 40 meq via ORAL
  Filled 2022-05-06: qty 2

## 2022-05-06 MED ORDER — FENTANYL CITRATE PF 50 MCG/ML IJ SOSY
50.0000 ug | PREFILLED_SYRINGE | Freq: Once | INTRAMUSCULAR | Status: AC
  Administered 2022-05-06: 50 ug via INTRAVENOUS
  Filled 2022-05-06: qty 1

## 2022-05-06 MED ORDER — HYDROMORPHONE HCL 1 MG/ML IJ SOLN
1.0000 mg | Freq: Once | INTRAMUSCULAR | Status: AC
  Administered 2022-05-06: 1 mg via INTRAVENOUS
  Filled 2022-05-06: qty 1

## 2022-05-06 MED ORDER — HEPARIN BOLUS VIA INFUSION
3750.0000 [IU] | Freq: Once | INTRAVENOUS | Status: AC
  Administered 2022-05-06: 3750 [IU] via INTRAVENOUS
  Filled 2022-05-06: qty 3750

## 2022-05-06 MED ORDER — HEPARIN (PORCINE) 25000 UT/250ML-% IV SOLN
750.0000 [IU]/h | INTRAVENOUS | Status: DC
  Administered 2022-05-06: 750 [IU]/h via INTRAVENOUS
  Filled 2022-05-06: qty 250

## 2022-05-06 NOTE — ED Notes (Signed)
Ed provider at bedside at this time ?

## 2022-05-06 NOTE — ED Triage Notes (Signed)
BIB [facility] from home c/o CP x 2 hr that radiates down her lt arm, weakness, and fatigue. CP increases with movement, guarding her cchest. Hx af angina. ASA 324mg . 12-lead, Stroke scale -, CBG 146 ?

## 2022-05-06 NOTE — ED Notes (Signed)
Cardio at bedside at this time.

## 2022-05-06 NOTE — ED Provider Notes (Signed)
?[facility] EMERGENCY DEPARTMENT ?Provider Note ? ? ?CSN: [id number] ?Arrival date & time: 05/06/22  2004 ? ?  ? ?History ? ?Chief Complaint  ?Patient presents with  ? Chest Pain  ? ? ?Stephanie Thornton is a 51 y.o. female. ? ? ?Chest Pain ?Associated symptoms: no fever   ? ?HPI: A 51 year old patient with a history of treated diabetes, hypertension and hypercholesterolemia presents for evaluation of chest pain. Initial onset of pain was approximately 1-3 hours ago. The patient's chest pain is described as heaviness/pressure/tightness and is not worse with exertion. The patient complains of nausea. The patient's chest pain is middle- or left-sided, is not well-localized, is not sharp and does radiate to the arms/jaw/neck. The patient denies diaphoresis. The patient has no history of stroke, has no history of peripheral artery disease, has not smoked in the past 90 days, has no relevant family history of coronary artery disease (first degree relative at less than age 65) and does not have an elevated BMI (>=30).  ? ?Home Medications ?Prior to Admission medications   ?Medication Sig Start Date End Date Taking? Authorizing Provider  ?albuterol (PROVENTIL HFA;VENTOLIN HFA) 108 (90 Base) MCG/ACT inhaler Inhale 1-2 puffs into the lungs every 6 (six) hours as needed for wheezing or shortness of breath. 03/04/19   Grayce Sessions, NP  ?amitriptyline (ELAVIL) 150 MG tablet Take 150 mg by mouth daily. 01/28/19   [provider]  ?aspirin 81 MG EC tablet Take 1 tablet (81 mg total) by mouth daily. 12/15/18   Loletta Specter, PA-C  ?busPIRone (BUSPAR) 10 MG tablet Take 10 mg by mouth 3 (three) times daily. 01/25/19   [provider]  ?erythromycin ophthalmic ointment Place a 1/2 inch ribbon of ointment into the lower eyelid four times daily for 7 days. 08/01/21   Gustavus Bryant, FNP  ?FARXIGA 10 MG TABS tablet Take 20 mg by mouth daily. 09/19/20   [provider]  ?gabapentin  (NEURONTIN) 300 MG capsule Take 1 capsule (300 mg total) by mouth 3 (three) times daily. 06/29/19   Grayce Sessions, NP  ?insulin glargine (LANTUS) 100 UNIT/ML injection Use 40 units at bedtime and 40 units 12 hours after bedtime dose. ?Patient not taking: No sig reported 12/15/18   Loletta Specter, PA-C  ?Insulin Syringe-Needle U-100 (INSULIN SYRINGE 1CC/30GX1/2") 30G X 1/2" 1 ML MISC Inject 1 application into the skin 5 (five) times daily. ?Patient not taking: No sig reported 12/15/18   Loletta Specter, PA-C  ?LATUDA 80 MG TABS tablet Take 80 mg by mouth daily. 02/01/19   [provider]  ?Karlene Einstein 72 MCG capsule Take 72 mcg by mouth daily before breakfast. 10/09/20   [provider]  ?lovastatin (MEVACOR) 40 MG tablet Take 1 tablet (40 mg total) by mouth at bedtime. 06/01/19   Hoy Register, MD  ?lubiprostone (AMITIZA) 24 MCG capsule Take 1 capsule (24 mcg total) by mouth 2 (two) times daily with a meal. ?Patient not taking: No sig reported 05/30/19   Grayce Sessions, NP  ?metoCLOPramide (REGLAN) 10 MG tablet Take 1 tablet (10 mg total) by mouth every 6 (six) hours as needed for nausea (abdominal cramping). 12/16/20   Lorelee New, PA-C  ?Multiple Vitamin (MULTI-VITAMINS) TABS Take 1 tablet by mouth daily. 12/15/18   Loletta Specter, PA-C  ?NOVOLOG 100 UNIT/ML injection Inject 0.5-30 Units into the skin daily. 11/21/20   [provider]  ?Olmesartan-amLODIPine-HCTZ 40-10-25 MG TABS Take 1 tablet by  mouth every evening. 06/23/19   Grayce Sessions, NP  ?ondansetron (ZOFRAN-ODT) 4 MG disintegrating tablet Take 1 tablet (4 mg total) by mouth every 8 (eight) hours as needed for nausea or vomiting. 12/05/20   Georgetta Haber, NP  ?oxyCODONE (ROXICODONE) 15 MG immediate release tablet Take 15 mg by mouth 4 (four) times daily as needed for pain. 02/20/19   [provider]  ?OZEMPIC, 1 MG/DOSE, 4 MG/3ML SOPN Inject 1 mg as directed once a week. 12/14/20   [provider]  ?polyethylene glycol powder (GLYCOLAX/MIRALAX) 17 GM/SCOOP powder Take 17 g by mouth daily. 12/05/20   Georgetta Haber, NP  ?sitaGLIPtin-metformin (JANUMET) 50-1000 MG tablet Take 1 tablet by mouth 2 (two) times daily with a meal. ?Patient not taking: No sig reported 12/15/18   Loletta Specter, PA-C  ?topiramate (TOPAMAX) 200 MG tablet Take 200 mg by mouth daily. 01/28/19   [provider]  ?traZODone (DESYREL) 100 MG tablet Take 200 mg by mouth daily.  02/24/19   [provider]  ?trimethoprim-polymyxin b (POLYTRIM) ophthalmic solution Place 1 drop into both eyes every 4 (four) hours. ?Patient not taking: No sig reported 12/08/19   Durward Parcel, FNP  ?   ? ?Allergies    ?Lyrica [pregabalin], Rocephin [ceftriaxone sodium in dextrose], Tramadol, Vancomycin, Glipizide, Keflex [cephalexin], Penicillins, Sulfa antibiotics, and Sitagliptin-metformin hcl   ? ?Review of Systems   ?Review of Systems  ?Constitutional:  Negative for fever.  ?Cardiovascular:  Positive for chest pain.  ? ?Physical Exam ?Updated Vital Signs ?BP (!) 151/100   Pulse 87   Temp 99.2 ?F (37.3 ?C)   Resp 15   Ht 1.575 m (5\' 2" )   Wt 62.6 kg   SpO2 100%   BMI 25.24 kg/m?  ?Physical Exam ?Vitals and nursing note reviewed.  ?Constitutional:   ?   General: She is not in acute distress. ?   Appearance: She is well-developed.  ?HENT:  ?   Head: Normocephalic and atraumatic.  ?   Right Ear: External ear normal.  ?   Left Ear: External ear normal.  ?Eyes:  ?   General: No scleral icterus.    ?   Right eye: No discharge.     ?   Left eye: No discharge.  ?   Conjunctiva/sclera: Conjunctivae normal.  ?Neck:  ?   Trachea: No tracheal deviation.  ?Cardiovascular:  ?   Rate and Rhythm: Normal rate and regular rhythm.  ?Pulmonary:  ?   Effort: Pulmonary effort is normal. No respiratory distress.  ?   Breath sounds: Normal breath sounds. No stridor. No wheezing or rales.  ?Abdominal:  ?   General: Bowel sounds are  normal. There is no distension.  ?   Palpations: Abdomen is soft.  ?   Tenderness: There is no abdominal tenderness. There is no guarding or rebound.  ?Musculoskeletal:     ?   General: No tenderness or deformity.  ?   Cervical back: Neck supple.  ?Skin: ?   General: Skin is warm and dry.  ?   Findings: No rash.  ?Neurological:  ?   General: No focal deficit present.  ?   Mental Status: She is alert.  ?   Cranial Nerves: No cranial nerve deficit (no facial droop, extraocular movements intact, no slurred speech).  ?   Sensory: No sensory deficit.  ?   Motor: No abnormal muscle tone or seizure activity.  ?   Coordination: Coordination normal.  ?  Psychiatric:     ?   Mood and Affect: Mood normal.  ? ? ?ED Results / Procedures / Treatments   ?Labs ?(all labs ordered are listed, but only abnormal results are displayed) ?Labs Reviewed  ?BASIC METABOLIC PANEL - Abnormal; Notable for the following components:  ?    Result Value  ? Potassium 3.0 (*)   ? Glucose, Bld 127 (*)   ? All other components within normal limits  ?CBC - Abnormal; Notable for the following components:  ? WBC 11.6 (*)   ? All other components within normal limits  ?I-STAT BETA HCG BLOOD, ED ([facility], [facility], [facility] ONLY) - Abnormal; Notable for the following components:  ? I-stat hCG, quantitative 7.2 (*)   ? All other components within normal limits  ?TROPONIN I (HIGH SENSITIVITY) - Abnormal; Notable for the following components:  ? Troponin I (High Sensitivity) 7,600 (*)   ? All other components within normal limits  ?PROTIME-INR  ?CBC  ?HEPARIN LEVEL (UNFRACTIONATED)  ?TROPONIN I (HIGH SENSITIVITY)  ? ? ?EKG ?EKG Interpretation ? ?Date/Time:  Monday May 06 2022 20:10:30 EDT ?Ventricular Rate:  93 ?PR Interval:  156 ?QRS Duration: 97 ?QT Interval:  397 ?QTC Calculation: 494 ?R Axis:   1 ?Text Interpretation: Sinus rhythm Borderline T wave abnormalities Borderline prolonged QT interval No significant change since last tracing Confirmed by Linwood DibblesKnapp, Maveryk Renstrom [id number]) on  05/06/2022 8:24:35 PM ? ?Radiology ?DG Chest 2 View ? ?Result Date: 05/06/2022 ?CLINICAL DATA:  Chest pain. EXAM: CHEST - 2 VIEW COMPARISON:  Chest radiograph dated 05/19/2018. FINDINGS: Minimal bibasilar linear and platelike atelecta

## 2022-05-06 NOTE — H&P (Signed)
?Cardiology Admission History and Physical:  ? ?Patient ID: Stephanie Thornton ?MRN: [id number]; DOB: 05/08/1971  ? ?Admission date: 05/06/2022 ? ?Primary Care Provider: Milford Cage, PA ?[facility] Cardiologist: Fransico Him, MD  ?[facility] Electrophysiologist:  None  ? ?Chief Complaint: chest pain  ? ?Patient Profile:  ? ?Stephanie Thornton is a 51 y.o. female with non obstructive CAD, HTN, DM2, asthma, and OSA who presents with acute on chronic worsening chest pain.  ? ?History of Present Illness:  ? ?[name] reports that she had acute onset chest pain at 0900 (05/07/22) that was 5/10 severity, nagging, left-sided with radiation towards her left arm.  The severity of the pain increased to 10/10 and was fairly constant and picked up in intensity throughout the afternoon and evening.  She was brought in by [facility] and was given aspirin 324 mg in route. ? ?VS on ED admission: P 87, BP 151/100, RR 15, O2 100%/RA, T 99.16F ? ?VS during my evaluation: P 91, BP 146/96 (111), RR 19, O2 98%/RA ? ?During my initial evaluation she was still experiencing 10/10 severity constant chest pain and was on low-dose nitroglycerin at 20 mcg/min.  She had also been given 3 SLNG without much symptom relief.  Nitroglycerin was rapidly uptitrated eventually reaching max dose of 200 mcg/min.  She was still reporting 7-8/10 severity chest discomfort despite max dose nitroglycerin and additional narcotics were provided which significantly improved symptoms.  Repeat ECGs without any ischemic changes.  Of note she is on chronic OxyContin for back/hip pain and OA.  ? ?Risk factors for obstructive coronary disease include DM2, active tobacco use (1 ppd), HTN and known prior non obstructive CAD.  Additionally her mother had MI and subsequent PCI at age 46 years old.  She denies cocaine or other illicit drug use. ? ?Past Medical History:  ?Diagnosis Date  ? Anxiety   ? Arthritis   ? "knees, feet, back" (05/24/2015)  ? Benign  essential HTN 05/01/2015  ? Bipolar disorder ([place]) diagnosed early 24s  ? Chronic bronchitis ([place])   ? "get it q yr"  ? Chronic lower back pain   ? Depression   ? Diabetes mellitus type II 2010  ? GERD (gastroesophageal reflux disease)   ? Headache   ? "maybe 3 times/wk" (05/24/2015)  ? History of hiatal hernia   ? History of stomach ulcers   ? Menopause   ? Migraine   ? "1-2/wk" (05/24/2015)  ? OSA on CPAP   ? ?Past Surgical History:  ?Procedure Laterality Date  ? ABDOMINAL HYSTERECTOMY  2007  ? BUNIONECTOMY Bilateral   ? CARPAL TUNNEL RELEASE Right   ? HAMMER TOE SURGERY Left   ? LEFT HEART CATH AND CORONARY ANGIOGRAPHY N/A 05/20/2018  ? Procedure: LEFT HEART CATH AND CORONARY ANGIOGRAPHY;  Surgeon: Leonie Man, MD;  Location: [facility];  Service: Cardiovascular;  Laterality: N/A;  ? TONSILLECTOMY Bilateral 05/24/2015  ? Procedure: TONSILLECTOMY;  Surgeon: Melida Quitter, MD;  Location: [facility];  Service: ENT;  Laterality: Bilateral;  ?  ?Medications Prior to Admission: ?Prior to Admission medications   ?Medication Sig Start Date End Date Taking? Authorizing Provider  ?albuterol (PROVENTIL HFA;VENTOLIN HFA) 108 (90 Base) MCG/ACT inhaler Inhale 1-2 puffs into the lungs every 6 (six) hours as needed for wheezing or shortness of breath. 03/04/19   Kerin Perna, NP  ?amitriptyline (ELAVIL) 150 MG tablet Take 150 mg by mouth daily. 01/28/19   [provider]  ?  aspirin 81 MG EC tablet Take 1 tablet (81 mg total) by mouth daily. 12/15/18   Clent Demark, PA-C  ?busPIRone (BUSPAR) 10 MG tablet Take 10 mg by mouth 3 (three) times daily. 01/25/19   [provider]  ?erythromycin ophthalmic ointment Place a 1/2 inch ribbon of ointment into the lower eyelid four times daily for 7 days. 08/01/21   Teodora Medici, FNP  ?FARXIGA 10 MG TABS tablet Take 20 mg by mouth daily. 09/19/20   [provider]  ?gabapentin (NEURONTIN) 300 MG capsule Take 1 capsule (300 mg total) by mouth 3 (three) times  daily. 06/29/19   Kerin Perna, NP  ?insulin glargine (LANTUS) 100 UNIT/ML injection Use 40 units at bedtime and 40 units 12 hours after bedtime dose. ?Patient not taking: No sig reported 12/15/18   Clent Demark, PA-C  ?Insulin Syringe-Needle U-100 (INSULIN SYRINGE 1CC/30GX1/2") 30G X 1/2" 1 ML MISC Inject 1 application into the skin 5 (five) times daily. ?Patient not taking: No sig reported 12/15/18   Clent Demark, PA-C  ?LATUDA 80 MG TABS tablet Take 80 mg by mouth daily. 02/01/19   [provider]  ?Rolan Lipa 72 MCG capsule Take 72 mcg by mouth daily before breakfast. 10/09/20   [provider]  ?lovastatin (MEVACOR) 40 MG tablet Take 1 tablet (40 mg total) by mouth at bedtime. 06/01/19   Charlott Rakes, MD  ?lubiprostone (AMITIZA) 24 MCG capsule Take 1 capsule (24 mcg total) by mouth 2 (two) times daily with a meal. ?Patient not taking: No sig reported 05/30/19   Kerin Perna, NP  ?metoCLOPramide (REGLAN) 10 MG tablet Take 1 tablet (10 mg total) by mouth every 6 (six) hours as needed for nausea (abdominal cramping). 12/16/20   Corena Herter, PA-C  ?Multiple Vitamin (MULTI-VITAMINS) TABS Take 1 tablet by mouth daily. 12/15/18   Clent Demark, PA-C  ?NOVOLOG 100 UNIT/ML injection Inject 0.5-30 Units into the skin daily. 11/21/20   [provider]  ?Olmesartan-amLODIPine-HCTZ 40-10-25 MG TABS Take 1 tablet by mouth every evening. 06/23/19   Kerin Perna, NP  ?ondansetron (ZOFRAN-ODT) 4 MG disintegrating tablet Take 1 tablet (4 mg total) by mouth every 8 (eight) hours as needed for nausea or vomiting. 12/05/20   Zigmund Gottron, NP  ?oxyCODONE (ROXICODONE) 15 MG immediate release tablet Take 15 mg by mouth 4 (four) times daily as needed for pain. 02/20/19   [provider]  ?OZEMPIC, 1 MG/DOSE, 4 MG/3ML SOPN Inject 1 mg as directed once a week. 12/14/20   [provider]  ?polyethylene glycol powder (GLYCOLAX/MIRALAX) 17 GM/SCOOP powder  Take 17 g by mouth daily. 12/05/20   Zigmund Gottron, NP  ?sitaGLIPtin-metformin (JANUMET) 50-1000 MG tablet Take 1 tablet by mouth 2 (two) times daily with a meal. ?Patient not taking: No sig reported 12/15/18   Clent Demark, PA-C  ?topiramate (TOPAMAX) 200 MG tablet Take 200 mg by mouth daily. 01/28/19   [provider]  ?traZODone (DESYREL) 100 MG tablet Take 200 mg by mouth daily.  02/24/19   [provider]  ?trimethoprim-polymyxin b (POLYTRIM) ophthalmic solution Place 1 drop into both eyes every 4 (four) hours. ?Patient not taking: No sig reported 12/08/19   Emerson Monte, FNP  ?  ?Allergies:    ?Allergies  ?Allergen Reactions  ? Lyrica [Pregabalin] Shortness Of Breath and Rash  ? Rocephin [Ceftriaxone Sodium In Dextrose] Shortness Of Breath  ? Tramadol Shortness Of Breath  ?  Had asthma attack  ? Vancomycin Anaphylaxis  ?  Per Dr. Henderson Cloud  ? Glipizide Hives  ? Keflex [Cephalexin] Hives  ? Penicillins Hives  ?  Has patient had a PCN reaction causing immediate rash, facial/tongue/throat swelling, SOB or lightheadedness with hypotension: YES ?Has patient had a PCN reaction causing severe rash involving mucus membranes or skin necrosis: NO ?Has patient had a PCN reaction that required hospitalization: YES ?Has patient had a PCN reaction occurring within the last 10 years: YES ?If all of the above answers are "NO", then may proceed with Cephalosporin use.  ? Sulfa Antibiotics Hives  ? Sitagliptin-Metformin Hcl Nausea Only  ? ?Social History:   ?Social History  ? ?Socioeconomic History  ? Marital status: Divorced  ?  Spouse name: Not on file  ? Number of children: 3  ? Years of education: Not on file  ? Highest education level: Not on file  ?Occupational History  ? Not on file  ?Tobacco Use  ? Smoking status: Some Days  ?  Packs/day: 1.00  ?  Years: 30.00  ?  Pack years: 30.00  ?  Types: Cigarettes  ? Smokeless tobacco: Never  ?Vaping Use  ? Vaping Use: Never used  ?Substance and Sexual  Activity  ? Alcohol use: No  ? Drug use: No  ? Sexual activity: Yes  ?Other Topics Concern  ? Not on file  ?Social History Narrative  ? Not on file  ? ?Social Determinants of Health  ? ?Emergency planning/management officer

## 2022-05-06 NOTE — Progress Notes (Signed)
ANTICOAGULATION CONSULT NOTE - Initial Consult ? ?Pharmacy Consult for heparin ?Indication: chest pain/ACS ? ?Allergies  ?Allergen Reactions  ? Lyrica [Pregabalin] Shortness Of Breath and Rash  ? Rocephin [Ceftriaxone Sodium In Dextrose] Shortness Of Breath  ? Tramadol Shortness Of Breath  ?  Had asthma attack  ? Vancomycin Anaphylaxis  ?  Per Dr. Henderson Cloud  ? Glipizide Hives  ? Keflex [Cephalexin] Hives  ? Penicillins Hives  ?  Has patient had a PCN reaction causing immediate rash, facial/tongue/throat swelling, SOB or lightheadedness with hypotension: YES ?Has patient had a PCN reaction causing severe rash involving mucus membranes or skin necrosis: NO ?Has patient had a PCN reaction that required hospitalization: YES ?Has patient had a PCN reaction occurring within the last 10 years: YES ?If all of the above answers are "NO", then may proceed with Cephalosporin use.  ? Sulfa Antibiotics Hives  ? Sitagliptin-Metformin Hcl Nausea Only  ? ? ?Patient Measurements: ?Height: 5\' 2"  (157.5 cm) ?Weight: 62.6 kg (138 lb) ?IBW/kg (Calculated) : 50.1 ?Heparin Dosing Weight: 62.6 kg ? ?Vital Signs: ?Temp: 99.2 ?F (37.3 ?C) (05/08 2015) ?Temp Source: Oral (05/08 2026) ?BP: 133/90 (05/08 2105) ?Pulse Rate: 92 (05/08 2105) ? ?Labs: ?Recent Labs  ?  05/06/22 ?2019  ?HGB 12.9  ?HCT 39.1  ?PLT 217  ?LABPROT 13.4  ?INR 1.0  ?CREATININE 0.87  ?TROPONINIHS 7,600*  ? ? ?Estimated Creatinine Clearance: 68 mL/min (by C-G formula based on SCr of 0.87 mg/dL). ? ? ?Medical History: ?Past Medical History:  ?Diagnosis Date  ? Anxiety   ? Arthritis   ? "knees, feet, back" (05/24/2015)  ? Benign essential HTN 05/01/2015  ? Bipolar disorder ([place]) diagnosed early 75s  ? Chronic bronchitis ([place])   ? "get it q yr"  ? Chronic lower back pain   ? Depression   ? Diabetes mellitus type II 2010  ? GERD (gastroesophageal reflux disease)   ? Headache   ? "maybe 3 times/wk" (05/24/2015)  ? History of hiatal hernia   ? History of stomach ulcers   ? Menopause   ?  Migraine   ? "1-2/wk" (05/24/2015)  ? OSA on CPAP   ? ? ?Medications:  ?Infusions:  ? heparin    ? nitroGLYCERIN    ? ? ?Assessment: ?[name] who presented with CP found to have elevated troponins. No AC PTA. Pharmacy consulted to dose heparin.  ? ?CBC within normal limits.  ? ?Goal of Therapy:  ?Heparin level 0.3-0.7 units/ml ?Monitor platelets by anticoagulation protocol: Yes ?  ?Plan:  ?Give 3750 units bolus x 1 ?Start heparin infusion at 750 units/hr ?Check anti-Xa level in 6 hours and daily while on heparin ?Continue to monitor H&H and platelets ? ?Stephanie Thornton, PharmD ?PGY2 Cardiology Pharmacy Resident ?05/06/2022  9:51 PM ? ?Please check [email or web address] for unit-specific pharmacy phone numbers. ? ?Stephanie Thornton ?05/06/2022,9:49 PM ? ? ?

## 2022-05-07 ENCOUNTER — Encounter (HOSPITAL_COMMUNITY)
Admission: EM | Disposition: A | Discharge status: Home / Self Care | Payer: Self-pay | Source: Home / Self Care | Attending: Cardiology

## 2022-05-07 ENCOUNTER — Other Ambulatory Visit (HOSPITAL_COMMUNITY): Payer: Self-pay

## 2022-05-07 ENCOUNTER — Inpatient Hospital Stay (HOSPITAL_COMMUNITY): Payer: Medicare HMO

## 2022-05-07 ENCOUNTER — Encounter (HOSPITAL_COMMUNITY): Payer: Self-pay | Admitting: Interventional Cardiology

## 2022-05-07 DIAGNOSIS — F17210 Nicotine dependence, cigarettes, uncomplicated: Secondary | ICD-10-CM | POA: Diagnosis present

## 2022-05-07 DIAGNOSIS — I25110 Atherosclerotic heart disease of native coronary artery with unstable angina pectoris: Secondary | ICD-10-CM | POA: Diagnosis present

## 2022-05-07 DIAGNOSIS — I248 Other forms of acute ischemic heart disease: Secondary | ICD-10-CM

## 2022-05-07 DIAGNOSIS — E1142 Type 2 diabetes mellitus with diabetic polyneuropathy: Secondary | ICD-10-CM | POA: Diagnosis present

## 2022-05-07 DIAGNOSIS — I2510 Atherosclerotic heart disease of native coronary artery without angina pectoris: Secondary | ICD-10-CM | POA: Diagnosis not present

## 2022-05-07 DIAGNOSIS — Z794 Long term (current) use of insulin: Secondary | ICD-10-CM | POA: Diagnosis not present

## 2022-05-07 DIAGNOSIS — Z880 Allergy status to penicillin: Secondary | ICD-10-CM | POA: Diagnosis not present

## 2022-05-07 DIAGNOSIS — I214 Non-ST elevation (NSTEMI) myocardial infarction: Secondary | ICD-10-CM | POA: Diagnosis present

## 2022-05-07 DIAGNOSIS — Z888 Allergy status to other drugs, medicaments and biological substances status: Secondary | ICD-10-CM | POA: Diagnosis not present

## 2022-05-07 DIAGNOSIS — J45909 Unspecified asthma, uncomplicated: Secondary | ICD-10-CM | POA: Diagnosis present

## 2022-05-07 DIAGNOSIS — G4733 Obstructive sleep apnea (adult) (pediatric): Secondary | ICD-10-CM | POA: Diagnosis present

## 2022-05-07 DIAGNOSIS — I10 Essential (primary) hypertension: Secondary | ICD-10-CM | POA: Diagnosis present

## 2022-05-07 DIAGNOSIS — E7800 Pure hypercholesterolemia, unspecified: Secondary | ICD-10-CM | POA: Diagnosis present

## 2022-05-07 DIAGNOSIS — Z882 Allergy status to sulfonamides status: Secondary | ICD-10-CM | POA: Diagnosis not present

## 2022-05-07 DIAGNOSIS — Z7982 Long term (current) use of aspirin: Secondary | ICD-10-CM | POA: Diagnosis not present

## 2022-05-07 DIAGNOSIS — E876 Hypokalemia: Secondary | ICD-10-CM | POA: Diagnosis present

## 2022-05-07 DIAGNOSIS — F319 Bipolar disorder, unspecified: Secondary | ICD-10-CM | POA: Diagnosis present

## 2022-05-07 DIAGNOSIS — E1165 Type 2 diabetes mellitus with hyperglycemia: Secondary | ICD-10-CM | POA: Diagnosis present

## 2022-05-07 DIAGNOSIS — Z79891 Long term (current) use of opiate analgesic: Secondary | ICD-10-CM | POA: Diagnosis not present

## 2022-05-07 DIAGNOSIS — I249 Acute ischemic heart disease, unspecified: Secondary | ICD-10-CM

## 2022-05-07 DIAGNOSIS — K589 Irritable bowel syndrome without diarrhea: Secondary | ICD-10-CM | POA: Diagnosis not present

## 2022-05-07 DIAGNOSIS — Z79899 Other long term (current) drug therapy: Secondary | ICD-10-CM | POA: Diagnosis not present

## 2022-05-07 DIAGNOSIS — Z8249 Family history of ischemic heart disease and other diseases of the circulatory system: Secondary | ICD-10-CM | POA: Diagnosis not present

## 2022-05-07 DIAGNOSIS — G8929 Other chronic pain: Secondary | ICD-10-CM | POA: Diagnosis present

## 2022-05-07 DIAGNOSIS — Z885 Allergy status to narcotic agent status: Secondary | ICD-10-CM | POA: Diagnosis not present

## 2022-05-07 DIAGNOSIS — K219 Gastro-esophageal reflux disease without esophagitis: Secondary | ICD-10-CM | POA: Diagnosis present

## 2022-05-07 HISTORY — PX: CORONARY STENT INTERVENTION: CATH118234

## 2022-05-07 HISTORY — PX: LEFT HEART CATH AND CORONARY ANGIOGRAPHY: CATH118249

## 2022-05-07 LAB — CBC
HCT: 28.9 % — ABNORMAL LOW (ref 36.0–46.0)
Hemoglobin: 9.4 g/dL — ABNORMAL LOW (ref 12.0–15.0)
MCH: 28.1 pg (ref 26.0–34.0)
MCHC: 32.5 g/dL (ref 30.0–36.0)
MCV: 86.5 fL (ref 80.0–100.0)
Platelets: 158 10*3/uL (ref 150–400)
RBC: 3.34 MIL/uL — ABNORMAL LOW (ref 3.87–5.11)
RDW: 14.3 % (ref 11.5–15.5)
WBC: 8.3 10*3/uL (ref 4.0–10.5)
nRBC: 0 % (ref 0.0–0.2)

## 2022-05-07 LAB — HCG, QUANTITATIVE, PREGNANCY: hCG, Beta Chain, Quant, S: 4 m[IU]/mL (ref <5)

## 2022-05-07 LAB — LIPID PANEL
Cholesterol: 111 mg/dL (ref 0–200)
HDL: 41 mg/dL (ref >40)
LDL Cholesterol: 58 mg/dL (ref 0–99)
Total CHOL/HDL Ratio: 2.7 RATIO
Triglycerides: 60 mg/dL (ref <150)
VLDL: 12 mg/dL (ref 0–40)

## 2022-05-07 LAB — T4, FREE: Free T4: 0.66 ng/dL (ref 0.61–1.12)

## 2022-05-07 LAB — ECHOCARDIOGRAM COMPLETE
AR max vel: 2.17 cm2
AV Peak grad: 4.5 mmHg
Ao pk vel: 1.06 m/s
Height: 62 in
S' Lateral: 3 cm
Weight: 2208 oz

## 2022-05-07 LAB — CREATININE, SERUM
Creatinine, Ser: 0.8 mg/dL (ref 0.44–1.00)
GFR, Estimated: >60 mL/min (ref >60)

## 2022-05-07 LAB — HEPARIN LEVEL (UNFRACTIONATED)
Heparin Unfractionated: >1.1 IU/mL — ABNORMAL HIGH (ref 0.30–0.70)
Heparin Unfractionated: 0.27 IU/mL — ABNORMAL LOW (ref 0.30–0.70)

## 2022-05-07 LAB — MAGNESIUM: Magnesium: 1.7 mg/dL (ref 1.7–2.4)

## 2022-05-07 LAB — POCT ACTIVATED CLOTTING TIME
Activated Clotting Time: 444 seconds
Activated Clotting Time: 378 seconds

## 2022-05-07 LAB — POTASSIUM: Potassium: 3.6 mmol/L (ref 3.5–5.1)

## 2022-05-07 LAB — TSH: TSH: 0.19 u[IU]/mL — ABNORMAL LOW (ref 0.350–4.500)

## 2022-05-07 LAB — HEMOGLOBIN A1C
Hgb A1c MFr Bld: 9.4 % — ABNORMAL HIGH (ref 4.8–5.6)
Mean Plasma Glucose: 223.08 mg/dL

## 2022-05-07 LAB — TROPONIN I (HIGH SENSITIVITY): Troponin I (High Sensitivity): >24000 ng/L (ref <18)

## 2022-05-07 LAB — MRSA NEXT GEN BY PCR, NASAL: MRSA by PCR Next Gen: NOT DETECTED

## 2022-05-07 LAB — HIV ANTIBODY (ROUTINE TESTING W REFLEX): HIV Screen 4th Generation wRfx: NONREACTIVE

## 2022-05-07 LAB — GLUCOSE, CAPILLARY
Glucose-Capillary: 179 mg/dL — ABNORMAL HIGH (ref 70–99)
Glucose-Capillary: 241 mg/dL — ABNORMAL HIGH (ref 70–99)

## 2022-05-07 SURGERY — LEFT HEART CATH AND CORONARY ANGIOGRAPHY
Anesthesia: LOCAL

## 2022-05-07 MED ORDER — VERAPAMIL HCL 2.5 MG/ML IV SOLN
INTRAVENOUS | Status: AC
  Filled 2022-05-07: qty 2

## 2022-05-07 MED ORDER — SODIUM CHLORIDE 0.9% FLUSH: 3.0000 mL | INTRAVENOUS | Status: DC | PRN

## 2022-05-07 MED ORDER — SODIUM CHLORIDE 0.9 % IV SOLN
INTRAVENOUS | Status: AC | PRN
  Administered 2022-05-07: 63 mL/h via INTRAVENOUS

## 2022-05-07 MED ORDER — HEPARIN (PORCINE) IN NACL 1000-0.9 UT/500ML-% IV SOLN
INTRAVENOUS | Status: AC
  Filled 2022-05-07: qty 500

## 2022-05-07 MED ORDER — POTASSIUM CHLORIDE CRYS ER 20 MEQ PO TBCR
40.0000 meq | EXTENDED_RELEASE_TABLET | Freq: Once | ORAL | Status: AC
  Administered 2022-05-07: 40 meq via ORAL
  Filled 2022-05-07: qty 2

## 2022-05-07 MED ORDER — SODIUM CHLORIDE 0.9% FLUSH
3.0000 mL | Freq: Two times a day (BID) | INTRAVENOUS | Status: DC
  Administered 2022-05-07 – 2022-05-09 (×3): 3 mL via INTRAVENOUS

## 2022-05-07 MED ORDER — SODIUM CHLORIDE 0.9% FLUSH
3.0000 mL | Freq: Two times a day (BID) | INTRAVENOUS | Status: DC
  Administered 2022-05-07: 3 mL via INTRAVENOUS

## 2022-05-07 MED ORDER — ASPIRIN 81 MG PO CHEW
81.0000 mg | CHEWABLE_TABLET | Freq: Every day | ORAL | Status: DC
  Administered 2022-05-08 – 2022-05-09 (×2): 81 mg via ORAL
  Filled 2022-05-07 (×2): qty 1

## 2022-05-07 MED ORDER — ATORVASTATIN CALCIUM 80 MG PO TABS
80.0000 mg | ORAL_TABLET | Freq: Every day | ORAL | Status: DC
  Administered 2022-05-08 – 2022-05-09 (×2): 80 mg via ORAL
  Filled 2022-05-07 (×2): qty 1

## 2022-05-07 MED ORDER — LURASIDONE HCL 40 MG PO TABS
80.0000 mg | ORAL_TABLET | Freq: Every day | ORAL | Status: DC
  Administered 2022-05-08 – 2022-05-09 (×2): 80 mg via ORAL
  Filled 2022-05-07 (×3): qty 2

## 2022-05-07 MED ORDER — LIDOCAINE HCL (PF) 1 % IJ SOLN
INTRAMUSCULAR | Status: DC | PRN
  Administered 2022-05-07: 2 mL

## 2022-05-07 MED ORDER — ONDANSETRON HCL 4 MG/2ML IJ SOLN: 4.0000 mg | Freq: Four times a day (QID) | INTRAMUSCULAR | Status: DC | PRN

## 2022-05-07 MED ORDER — INSULIN ASPART 100 UNIT/ML IJ SOLN
0.0000 [IU] | Freq: Three times a day (TID) | INTRAMUSCULAR | Status: DC
  Administered 2022-05-08: 8 [IU] via SUBCUTANEOUS
  Administered 2022-05-08: 5 [IU] via SUBCUTANEOUS
  Administered 2022-05-08: 3 [IU] via SUBCUTANEOUS
  Administered 2022-05-09: 8 [IU] via SUBCUTANEOUS

## 2022-05-07 MED ORDER — NITROGLYCERIN 0.4 MG SL SUBL: 0.4000 mg | SUBLINGUAL_TABLET | SUBLINGUAL | Status: DC | PRN

## 2022-05-07 MED ORDER — SODIUM CHLORIDE 0.9 % IV SOLN: 250.0000 mL | INTRAVENOUS | Status: DC | PRN

## 2022-05-07 MED ORDER — AMLODIPINE BESYLATE 10 MG PO TABS
10.0000 mg | ORAL_TABLET | Freq: Every day | ORAL | Status: DC
  Administered 2022-05-07 – 2022-05-09 (×3): 10 mg via ORAL
  Filled 2022-05-07 (×3): qty 1

## 2022-05-07 MED ORDER — FENTANYL CITRATE (PF) 100 MCG/2ML IJ SOLN
INTRAMUSCULAR | Status: DC | PRN
  Administered 2022-05-07: 50 ug via INTRAVENOUS

## 2022-05-07 MED ORDER — NITROGLYCERIN 1 MG/10 ML FOR IR/CATH LAB
INTRA_ARTERIAL | Status: AC
  Filled 2022-05-07: qty 10

## 2022-05-07 MED ORDER — SODIUM CHLORIDE 0.9 % WEIGHT BASED INFUSION: 1.0000 mL/kg/h | INTRAVENOUS | Status: DC

## 2022-05-07 MED ORDER — LINACLOTIDE 72 MCG PO CAPS
72.0000 ug | ORAL_CAPSULE | Freq: Every day | ORAL | Status: DC
  Filled 2022-05-07 (×2): qty 1

## 2022-05-07 MED ORDER — MIDAZOLAM HCL 2 MG/2ML IJ SOLN
INTRAMUSCULAR | Status: DC | PRN
  Administered 2022-05-07: 1 mg via INTRAVENOUS

## 2022-05-07 MED ORDER — LIDOCAINE HCL (PF) 1 % IJ SOLN
INTRAMUSCULAR | Status: AC
  Filled 2022-05-07: qty 30

## 2022-05-07 MED ORDER — AMITRIPTYLINE HCL 50 MG PO TABS
150.0000 mg | ORAL_TABLET | Freq: Every day | ORAL | Status: DC
  Administered 2022-05-07 – 2022-05-08 (×2): 150 mg via ORAL
  Filled 2022-05-07 (×2): qty 3
  Filled 2022-05-07: qty 2
  Filled 2022-05-07 (×2): qty 3

## 2022-05-07 MED ORDER — GABAPENTIN 300 MG PO CAPS
300.0000 mg | ORAL_CAPSULE | Freq: Every day | ORAL | Status: DC
  Administered 2022-05-08 – 2022-05-09 (×2): 300 mg via ORAL
  Filled 2022-05-07 (×2): qty 1

## 2022-05-07 MED ORDER — HEPARIN (PORCINE) IN NACL 1000-0.9 UT/500ML-% IV SOLN
INTRAVENOUS | Status: DC | PRN
  Administered 2022-05-07 (×2): 500 mL

## 2022-05-07 MED ORDER — SODIUM CHLORIDE 0.9 % IV SOLN
250.0000 mL | INTRAVENOUS | Status: DC | PRN
Start: 2022-05-07 — End: 2022-05-09

## 2022-05-07 MED ORDER — FENTANYL CITRATE (PF) 100 MCG/2ML IJ SOLN
INTRAMUSCULAR | Status: AC
  Filled 2022-05-07: qty 2

## 2022-05-07 MED ORDER — TRAZODONE HCL 50 MG PO TABS
150.0000 mg | ORAL_TABLET | Freq: Every day | ORAL | Status: DC
  Administered 2022-05-07 – 2022-05-08 (×2): 150 mg via ORAL
  Filled 2022-05-07: qty 3
  Filled 2022-05-07: qty 1

## 2022-05-07 MED ORDER — MIDAZOLAM HCL 2 MG/2ML IJ SOLN
INTRAMUSCULAR | Status: AC
  Filled 2022-05-07: qty 2

## 2022-05-07 MED ORDER — HYDROMORPHONE HCL 1 MG/ML IJ SOLN
1.0000 mg | INTRAMUSCULAR | Status: DC | PRN
  Administered 2022-05-07 (×2): 1 mg via INTRAVENOUS
  Filled 2022-05-07 (×2): qty 1

## 2022-05-07 MED ORDER — METOPROLOL TARTRATE 25 MG PO TABS
25.0000 mg | ORAL_TABLET | Freq: Two times a day (BID) | ORAL | Status: DC
  Administered 2022-05-07 – 2022-05-09 (×4): 25 mg via ORAL
  Filled 2022-05-07 (×4): qty 1

## 2022-05-07 MED ORDER — OXYCODONE HCL 5 MG PO TABS
15.0000 mg | ORAL_TABLET | Freq: Four times a day (QID) | ORAL | Status: DC | PRN
  Administered 2022-05-07 – 2022-05-09 (×2): 15 mg via ORAL
  Filled 2022-05-07 (×2): qty 3

## 2022-05-07 MED ORDER — HEPARIN SODIUM (PORCINE) 1000 UNIT/ML IJ SOLN
INTRAMUSCULAR | Status: DC | PRN
  Administered 2022-05-07: 3500 [IU] via INTRAVENOUS
  Administered 2022-05-07: 5000 [IU] via INTRAVENOUS

## 2022-05-07 MED ORDER — TOPIRAMATE 100 MG PO TABS
200.0000 mg | ORAL_TABLET | Freq: Every day | ORAL | Status: DC
  Administered 2022-05-07 – 2022-05-08 (×2): 200 mg via ORAL
  Filled 2022-05-07 (×4): qty 2

## 2022-05-07 MED ORDER — OXYCODONE HCL 5 MG PO TABS: 5.0000 mg | ORAL_TABLET | ORAL | Status: DC | PRN

## 2022-05-07 MED ORDER — NITROGLYCERIN IN D5W 200-5 MCG/ML-% IV SOLN
0.0000 ug/min | INTRAVENOUS | Status: DC
  Administered 2022-05-07: 60 ug/min via INTRAVENOUS

## 2022-05-07 MED ORDER — TICAGRELOR 90 MG PO TABS
ORAL_TABLET | ORAL | Status: AC
  Filled 2022-05-07: qty 2

## 2022-05-07 MED ORDER — HYDRALAZINE HCL 20 MG/ML IJ SOLN: 10.0000 mg | INTRAMUSCULAR | Status: AC | PRN

## 2022-05-07 MED ORDER — TICAGRELOR 90 MG PO TABS
90.0000 mg | ORAL_TABLET | Freq: Two times a day (BID) | ORAL | Status: DC
  Administered 2022-05-07 – 2022-05-09 (×4): 90 mg via ORAL
  Filled 2022-05-07 (×4): qty 1

## 2022-05-07 MED ORDER — LABETALOL HCL 5 MG/ML IV SOLN
10.0000 mg | INTRAVENOUS | Status: AC | PRN
  Administered 2022-05-07: 10 mg via INTRAVENOUS
  Filled 2022-05-07 (×2): qty 4

## 2022-05-07 MED ORDER — ASPIRIN EC 81 MG PO TBEC: 81.0000 mg | DELAYED_RELEASE_TABLET | Freq: Every day | ORAL | Status: DC

## 2022-05-07 MED ORDER — ACETAMINOPHEN 325 MG PO TABS: 650.0000 mg | ORAL_TABLET | ORAL | Status: DC | PRN

## 2022-05-07 MED ORDER — TIROFIBAN HCL IN NACL 5-0.9 MG/100ML-% IV SOLN
INTRAVENOUS | Status: AC
  Filled 2022-05-07: qty 100

## 2022-05-07 MED ORDER — TICAGRELOR 90 MG PO TABS
ORAL_TABLET | ORAL | Status: DC | PRN
  Administered 2022-05-07: 180 mg via ORAL

## 2022-05-07 MED ORDER — BUSPIRONE HCL 10 MG PO TABS
10.0000 mg | ORAL_TABLET | Freq: Three times a day (TID) | ORAL | Status: DC
Start: 2022-05-07 — End: 2022-05-09
  Administered 2022-05-07 – 2022-05-09 (×6): 10 mg via ORAL
  Filled 2022-05-07 (×6): qty 1

## 2022-05-07 MED ORDER — SODIUM CHLORIDE 0.9 % IV SOLN: INTRAVENOUS | Status: AC

## 2022-05-07 MED ORDER — HEPARIN SODIUM (PORCINE) 1000 UNIT/ML IJ SOLN
INTRAMUSCULAR | Status: AC
  Filled 2022-05-07: qty 10

## 2022-05-07 MED ORDER — TIROFIBAN (AGGRASTAT) BOLUS VIA INFUSION
INTRAVENOUS | Status: DC | PRN
  Administered 2022-05-07: 1565 ug via INTRAVENOUS

## 2022-05-07 MED ORDER — VERAPAMIL HCL 2.5 MG/ML IV SOLN
INTRAVENOUS | Status: DC | PRN
  Administered 2022-05-07: 10 mL via INTRA_ARTERIAL

## 2022-05-07 MED ORDER — ENOXAPARIN SODIUM 40 MG/0.4ML IJ SOSY
40.0000 mg | PREFILLED_SYRINGE | INTRAMUSCULAR | Status: DC
  Administered 2022-05-08 – 2022-05-09 (×2): 40 mg via SUBCUTANEOUS
  Filled 2022-05-07 (×2): qty 0.4

## 2022-05-07 MED ORDER — CHLORHEXIDINE GLUCONATE CLOTH 2 % EX PADS
6.0000 | MEDICATED_PAD | Freq: Every day | CUTANEOUS | Status: DC
  Administered 2022-05-07 – 2022-05-09 (×3): 6 via TOPICAL

## 2022-05-07 MED ORDER — TIROFIBAN HCL IN NACL 5-0.9 MG/100ML-% IV SOLN
INTRAVENOUS | Status: AC | PRN
  Administered 2022-05-07: .075 ug/kg/min via INTRAVENOUS

## 2022-05-07 MED ORDER — ATORVASTATIN CALCIUM 80 MG PO TABS: 80.0000 mg | ORAL_TABLET | Freq: Every day | ORAL | Status: DC

## 2022-05-07 MED ORDER — IRBESARTAN 300 MG PO TABS
300.0000 mg | ORAL_TABLET | Freq: Every day | ORAL | Status: DC
Start: 2022-05-07 — End: 2022-05-09
  Administered 2022-05-07 – 2022-05-09 (×3): 300 mg via ORAL
  Filled 2022-05-07 (×3): qty 1

## 2022-05-07 MED ORDER — SODIUM CHLORIDE 0.9 % WEIGHT BASED INFUSION
3.0000 mL/kg/h | INTRAVENOUS | Status: AC
Start: 2022-05-07 — End: 2022-05-07

## 2022-05-07 SURGICAL SUPPLY — 19 items
BALL SAPPHIRE NC24 3.5X12 (BALLOONS) ×2
BALLN SAPPHIRE 2.5X15 (BALLOONS) ×2
BALLOON SAPPHIRE 2.5X15 (BALLOONS) IMPLANT
BALLOON SAPPHIRE NC24 3.5X12 (BALLOONS) IMPLANT
CATH 5FR JL3.5 JR4 ANG PIG MP (CATHETERS) ×1 IMPLANT
CATH VISTA GUIDE 6FR XB3.5 (CATHETERS) ×1 IMPLANT
DEVICE RAD COMP TR BAND LRG (VASCULAR PRODUCTS) ×1 IMPLANT
GLIDESHEATH SLEND A-KIT 6F 22G (SHEATH) ×1 IMPLANT
GUIDEWIRE INQWIRE 1.5J.035X260 (WIRE) IMPLANT
INQWIRE 1.5J .035X260CM (WIRE) ×2
KIT ENCORE 26 ADVANTAGE (KITS) ×1 IMPLANT
KIT HEART LEFT (KITS) ×2 IMPLANT
PACK CARDIAC CATHETERIZATION (CUSTOM PROCEDURE TRAY) ×2 IMPLANT
SHEATH PROBE COVER 6X72 (BAG) ×1 IMPLANT
STENT SYNERGY XD 3.0X24 (Permanent Stent) IMPLANT
SYNERGY XD 3.0X24 (Permanent Stent) ×2 IMPLANT
TRANSDUCER W/STOPCOCK (MISCELLANEOUS) ×2 IMPLANT
TUBING CIL FLEX 10 FLL-RA (TUBING) ×2 IMPLANT
WIRE ASAHI PROWATER 180CM (WIRE) ×1 IMPLANT

## 2022-05-07 NOTE — ED Notes (Signed)
Procedural consent completed per order by this RN, consent signed by pt and this RN. Consent at bedside.  ?

## 2022-05-07 NOTE — ED Notes (Signed)
Pt transported to cath lab via cath lab staff. Pts clothing, cell phone, and two gray toned necklaces (secured in denture cup with pts label), placed in pt belonging bag and transported with pt.  ?

## 2022-05-07 NOTE — Progress Notes (Addendum)
? ?Progress Note ? ?Patient Name: Stephanie Thornton ?Date of Encounter: 05/07/2022 ? ?[facility] Cardiologist: Armanda Magic, MD  ? ?Subjective  ? ?Admitted overnight with NSTEMI. High-sensitivity troponin >24,000. She reports fatigue and off and on chest heaviness for week but then had worsening chest pain Sunday. She reports associated shortness of breath and nausea. She is currently on IV Nitro drip and is still having 4-5/10 chest pain but appears comfortable and states it is much better than before. ? ?Inpatient Medications  ?  ?Scheduled Meds: ? amitriptyline  150 mg Oral Daily  ? aspirin EC  81 mg Oral Daily  ? atorvastatin  80 mg Oral Daily  ? busPIRone  10 mg Oral TID  ? gabapentin  300 mg Oral Daily  ? linaclotide  72 mcg Oral QAC breakfast  ? lurasidone  80 mg Oral Daily  ? topiramate  200 mg Oral Daily  ? traZODone  150 mg Oral Daily  ? ?Continuous Infusions: ? heparin 750 Units/hr (05/06/22 2158)  ? nitroGLYCERIN 200 mcg/min (05/07/22 0321)  ? ?PRN Meds: ?acetaminophen, HYDROmorphone (DILAUDID) injection, nitroGLYCERIN, ondansetron (ZOFRAN) IV, oxyCODONE  ? ?Vital Signs  ?  ?Vitals:  ? 05/07/22 0315 05/07/22 0330 05/07/22 0400 05/07/22 0430  ?BP: 113/76 118/73 123/77 121/81  ?Pulse: 89 90 90 89  ?Resp: (!) 9 10 14 14   ?Temp:      ?TempSrc:      ?SpO2: (!) 89% (!) 89% (!) 89% 91%  ?Weight:      ?Height:      ? ?No intake or output data in the 24 hours ending 05/07/22 0637 ? ?  05/06/2022  ?  8:27 PM 12/16/2020  ?  8:00 AM 03/18/2019  ?  9:01 AM  ?Last 3 Weights  ?Weight (lbs) 138 lb 154 lb 174 lb  ?Weight (kg) 62.596 kg 69.854 kg 78.926 kg  ?   ? ?Telemetry  ?  ?Sinus rhythm with rates in the 80s to 90s. - Personally Reviewed ? ?ECG  ?  ?Normal sinus rhythm with Q wave and slight flat ST elevation in lead III.  - Personally Reviewed ? ?Physical Exam  ? ?GEN: No acute distress.   ?Neck: No JVD. ?Cardiac: RRR. Radial pulses 2+ and equal bilaterally. No murmurs, rubs, or gallops.  ?Respiratory: Clear to  auscultation bilaterally. ?GI: Soft, no-distended, and non-tender.  ?MS: No lower extremity edema. No deformity. ?Skin: Warm and dry. ?Neuro:  No focal deficits.  ?Psych: Normal affect. Responds appropriately. ? ?Labs  ?  ?High Sensitivity Troponin:   ?Recent Labs  ?Lab 05/06/22 ?2019 05/06/22 ?2238  ?TROPONINIHS 7,600* >24,000*  ?   ?Chemistry ?Recent Labs  ?Lab 05/06/22 ?2019  ?NA 138  ?K 3.0*  ?CL 107  ?CO2 22  ?GLUCOSE 127*  ?BUN 6  ?CREATININE 0.87  ?CALCIUM 9.1  ?GFRNONAA >60  ?ANIONGAP 9  ?  ?Lipids  ?Recent Labs  ?Lab 05/07/22 ?0223  ?CHOL 111  ?TRIG 60  ?HDL 41  ?LDLCALC 58  ?CHOLHDL 2.7  ?  ?Hematology ?Recent Labs  ?Lab 05/06/22 ?2019 05/07/22 ?0223  ?WBC 11.6* 8.3  ?RBC 4.68 3.34*  ?HGB 12.9 9.4*  ?HCT 39.1 28.9*  ?MCV 83.5 86.5  ?MCH 27.6 28.1  ?MCHC 33.0 32.5  ?RDW 14.1 14.3  ?PLT 217 158  ? ?Thyroid No results for input(s): TSH, FREET4 in the last 168 hours.  ?BNPNo results for input(s): BNP, PROBNP in the last 168 hours.  ?DDimer No results for input(s): DDIMER in the last  168 hours.  ? ?Radiology  ?  ?DG Chest 2 View ? ?Result Date: 05/06/2022 ?CLINICAL DATA:  Chest pain. EXAM: CHEST - 2 VIEW COMPARISON:  Chest radiograph dated 05/19/2018. FINDINGS: Minimal bibasilar linear and platelike atelectasis/scarring. Developing infiltrate is less likely. No focal consolidation, pleural effusion, or pneumothorax. The cardiac silhouette is within normal limits. No acute osseous pathology. IMPRESSION: Minimal bibasilar atelectasis/scarring. No focal consolidation. Electronically Signed   By: Elgie CollardArash  Radparvar M.D.   On: 05/06/2022 21:34   ? ?Cardiac Studies  ? ?Cardiac Catheterization 05/20/2018: ?There is hyperdynamic left ventricular systolic function. ?The left ventricular ejection fraction is greater than 65% by visual estimate. ?LV end diastolic pressure is normal. ?There is no aortic valve stenosis. ?There is no mitral valve stenosis and no mitral valve prolapse evident. ?Mid RCA lesion is 30% stenosed. ?   ?Minimal, non-obstructive CAD ?Consider non-anginal chest pain ?Normal LVEF & LVEDP ? ?Patient Profile  ?   ?51 y.o. female with a history of non-obstructive CAD noted on cardiac catheterization in 04/2018, hypertension, hyperlipidemia, type 2 diabetes mellitus, asthma,  obstructive sleep apnea on CPAP, IBS depression/bipolar disorder, and chronic pain who was admitted with NSTEMI on 05/06/2022 after presenting with worsening chest pain. ? ?Assessment & Plan  ?  ?NSTEMI ?History of non-obstructive CAD on cath in 2019. Now presents with acute onset of left sided chest pain with radiation towards left arm. EKG shows no acute ischemic changes. High-sensitivity troponin elevated at 7,600 and then > 24,000.  ?- Still having 4-5/10 chest pain but appear comfortably. ?- Continue IV Heparin and IV Nitro. ?- Will check Echo. ?- Continue aspirin and high-sensitivity statin. Will add Lopressor 25mg  twice daily. ?- Will need cardiac catheterization early this morning. Currently scheduled for 1st case with Dr. Katrinka BlazingSmith. The patient understands that risks include but are not limited to stroke (1 in 1000), death (1 in 1000), kidney failure [usually temporary] (1 in 500), bleeding (1 in 200), allergic reaction [possibly serious] (1 in 200), and agrees to proceed.  ? ?Hypertension ?BP mildly elevated at times. ?- On Olmesartan-Amlodipine-HCTZ 40-10-25mg  daily at home. Currently on hold until after cath. ?- Will add Lopressor as above. ?- Also currently on Nitro drip. ? ?Hyperlipidemia ?LDL as high as 188 in the past but currently well controlled. Lipid panel this admission: Total Cholesterol 111, Triglycerides 60, HDL 41, LDL 58.  ?- Started on Lipitor 80mg  daily. ?- Will need repeat lipid panel and LFTs in 6-8 weeks. ? ?Type 2 Diabetes Mellitus ?Peripheral Neuropathy ?Hemoglobin A1c 9.4. ?- Continue sliding scale insulin here. ?- Will consult Diabetes Coordinator.  ? ?Abnormal TSH ?TSH low at 0.190.  ?- Will check free T4 and free  T3. ? ?Obstructive Sleep Apnea ?Continue CPAP at night. ? ?Asthma ?Continue albuterol as needed. ? ?Depression ?Bipolar Disorder ?Stable. ?- Continue home medications: Buspar 10mg  three times daily, Amitriptyline 150mg  daily, Latuda 80mg  daily, Topamax 200mg  daily, and Trazadone 150mg  daily. ? ?IBS ?- Continue home Linaclotide 72mcg daily. ? ?Chronic Pain ?- Continue home Oxycodone 15mg  every 6 hours as needed for pain. ? ?For questions or updates, please contact [facility] ?Please consult [email or web address] for contact info under  ? ?  ?   ?Signed, ?Corrin Parkerallie E Goodrich, PA-C  ?05/07/2022, 6:37 AM   ? ?I have personally seen and examined this patient. I agree with the assessment and plan as outlined above.  ?She is known to have CAD, DM, HTN, HLD presenting to the ED with chest pain. Troponin over 24,000.  EKG with sinus with poor R wave progression.  ?Exam with RRR and clear lungs ?Plan: NSTEMI: Cardiac cath this am. Continue IV NTG and IV heparin pre cath.  ? I have reviewed the risks, indications, and alternatives to cardiac catheterization, possible angioplasty, and stenting with the patient. Risks include but are not limited to bleeding, infection, vascular injury, stroke, myocardial infection, arrhythmia, kidney injury, radiation-related injury in the case of prolonged fluoroscopy use, emergency cardiac surgery, and death. The patient understands the risks of serious complication is 1-2 in 1000 with diagnostic cardiac cath and 1-2% or less with angioplasty/stenting. ? ? ?Verne Carrow, MD, [facility] ?05/07/2022 ?7:34 AM ? ? ?

## 2022-05-07 NOTE — ED Notes (Signed)
Spoke with cardio reference pt c/o pain advised he would put in orders for pain meds ?

## 2022-05-07 NOTE — TOC Benefit Eligibility Note (Signed)
Patient Advocate Encounter ? ?Insurance verification completed.   ? ?The patient is currently admitted and upon discharge could be taking Brilinta 90 mg. ? ?The current 30 day co-pay is, $0.00.  ? ?The patient is insured through [employer] Part D  ? ? ? ?Roland Earl, CPhT ?Pharmacy Patient Advocate Specialist ?[facility] Pharm[facility] Patient Advocate Team ?Direct Number: [phone]  Fax: [phone] ? ? ? ? ? ?  ?

## 2022-05-07 NOTE — Progress Notes (Signed)
ANTICOAGULATION CONSULT NOTE - Follow Up Consult ? ?Pharmacy Consult for Heparin ?Indication: chest pain/ACS ? ?Allergies  ?Allergen Reactions  ? Lyrica [Pregabalin] Shortness Of Breath and Rash  ? Rocephin [Ceftriaxone Sodium In Dextrose] Shortness Of Breath  ? Tramadol Shortness Of Breath  ?  Had asthma attack  ? Vancomycin Anaphylaxis  ?  Per Dr. Flonnie Overman  ? Glipizide Hives  ? Keflex [Cephalexin] Hives  ? Penicillins Hives  ?  Has patient had a PCN reaction causing immediate rash, facial/tongue/throat swelling, SOB or lightheadedness with hypotension: YES ?Has patient had a PCN reaction causing severe rash involving mucus membranes or skin necrosis: NO ?Has patient had a PCN reaction that required hospitalization: YES ?Has patient had a PCN reaction occurring within the last 10 years: YES ?If all of the above answers are "NO", then may proceed with Cephalosporin use.  ? Sulfa Antibiotics Hives  ? Sitagliptin-Metformin Hcl Nausea Only  ? ? ?Patient Measurements: ?Height: 5\' 2"  (157.5 cm) ?Weight: 62.6 kg (138 lb) ?IBW/kg (Calculated) : 50.1 ?Heparin Dosing Weight: 62.6 kg ? ?Vital Signs: ?Temp: 97.6 ?F (36.4 ?C) (05/09 04-02-1989) ?Temp Source: Oral (05/09 04-02-1989) ?BP: 143/101 (05/09 0730) ?Pulse Rate: 91 (05/09 0700) ? ?Labs: ?Recent Labs  ?  05/06/22 ?2019 05/06/22 ?2238 05/07/22 ?07/07/22 05/07/22 ?0734  ?HGB 12.9  --  9.4*  --   ?HCT 39.1  --  28.9*  --   ?PLT 217  --  158  --   ?LABPROT 13.4  --   --   --   ?INR 1.0  --   --   --   ?HEPARINUNFRC  --   --  >1.10* 0.27*  ?CREATININE 0.87  --   --   --   ?TROPONINIHS 7,600* >24,000*  --   --   ? ? ?Estimated Creatinine Clearance: 68 mL/min (by C-G formula based on SCr of 0.87 mg/dL). ? ? ?Medications:  ?Medications Prior to Admission  ?Medication Sig Dispense Refill Last Dose  ? amitriptyline (ELAVIL) 150 MG tablet Take 150 mg by mouth daily.   05/06/2022  ? busPIRone (BUSPAR) 10 MG tablet Take 10 mg by mouth 3 (three) times daily.   05/06/2022  ? FARXIGA 10 MG TABS tablet Take 10  mg by mouth daily.   05/06/2022  ? gabapentin (NEURONTIN) 300 MG capsule Take 1 capsule (300 mg total) by mouth 3 (three) times daily. (Patient taking differently: Take 300 mg by mouth daily.) 270 capsule 0 05/06/2022  ? LATUDA 80 MG TABS tablet Take 80 mg by mouth daily.   05/06/2022  ? LINZESS 72 MCG capsule Take 72 mcg by mouth daily before breakfast.   05/06/2022  ? NOVOLOG 100 UNIT/ML injection Inject 0.5-30 Units into the skin daily.   05/06/2022  ? Olmesartan-amLODIPine-HCTZ 40-10-25 MG TABS Take 1 tablet by mouth every evening. 90 tablet 1 05/06/2022  ? oxyCODONE (ROXICODONE) 15 MG immediate release tablet Take 15 mg by mouth 4 (four) times daily as needed for pain.   Past Week  ? OZEMPIC, 1 MG/DOSE, 4 MG/3ML SOPN Inject 1 mg as directed once a week.   05/06/2022  ? topiramate (TOPAMAX) 200 MG tablet Take 200 mg by mouth daily.   05/06/2022  ? traZODone (DESYREL) 100 MG tablet Take 150 mg by mouth daily.   05/06/2022  ? albuterol (PROVENTIL HFA;VENTOLIN HFA) 108 (90 Base) MCG/ACT inhaler Inhale 1-2 puffs into the lungs every 6 (six) hours as needed for wheezing or shortness of breath. 1 Inhaler 2 Unknown  ?  erythromycin ophthalmic ointment Place a 1/2 inch ribbon of ointment into the lower eyelid four times daily for 7 days. 3.5 g 0 More than a month  ? metoCLOPramide (REGLAN) 10 MG tablet Take 1 tablet (10 mg total) by mouth every 6 (six) hours as needed for nausea (abdominal cramping). 30 tablet 0 More than a month  ? Multiple Vitamin (MULTI-VITAMINS) TABS Take 1 tablet by mouth daily. 30 tablet 2 Unknown  ? ? ?Assessment: ?[name] who presented with chest pain and found to have elevated troponins. No anticoagulation PTA. Pharmacy consulted to dose heparin. ? ?Heparin level = 0.27, slighlty subtherapeutic ?Current heparin infusion rate: 750 units/hr ? ?Heparin infusion was stopped at 08:18 on 5/9 prior to [facility].  ? ?Goal of Therapy:  ?Heparin level 0.3-0.7 units/ml ?Monitor platelets by anticoagulation protocol: Yes ?  ?Plan:   ?Heparin paused for LHC  ?F/u anticoag plan s/p LHC on 5/9 ?Monitor for s/sx of bleeding.  ? ? ?Doristine Counter ?05/07/2022,9:19 AM ? ? ?

## 2022-05-07 NOTE — ED Notes (Signed)
Cardio provider Dr. Cherly Beach made aware of pt current trop level ?

## 2022-05-07 NOTE — Care Management (Signed)
?  Transition of Care (TOC) Screening Note ? ? ?Patient Details  ?Name: Stephanie Thornton ?Date of Birth: [birth year] ? ? ?Transition of Care (TOC) CM/SW Contact:    ?Graves-Bigelow, Lamar Laundry, RN ?Phone Number: ?05/07/2022, 1:06 PM ? ? ? ?Transition of Care Department [facility]) has reviewed the patient and no TOC needs have been identified at this time. We will continue to monitor patient advancement through interdisciplinary progression rounds. If new patient transition needs arise, please place a TOC consult. ?  ?

## 2022-05-07 NOTE — ED Notes (Signed)
Cardio made aware of VS at this time ?

## 2022-05-07 NOTE — Interval H&P Note (Signed)
Cath Lab Visit (complete for each Cath Lab visit) ? ?Clinical Evaluation Leading to the Procedure:  ? ?ACS: Yes.   ? ?Non-ACS:   ? ?Anginal Classification: CCS IV ? ?Anti-ischemic medical therapy: Minimal Therapy (1 class of medications) ? ?Non-Invasive Test Results: No non-invasive testing performed ? ?Prior CABG: No previous CABG ? ? ? ? ? ?History and Physical Interval Note: ? ?05/07/2022 ?8:34 AM ? ?Stephanie Thornton  has presented today for surgery, with the diagnosis of nstemim- chest pain.  The various methods of treatment have been discussed with the patient and family. After consideration of risks, benefits and other options for treatment, the patient has consented to  Procedure(s): ?LEFT HEART CATH AND CORONARY ANGIOGRAPHY (N/A) as a surgical intervention.  The patient's history has been reviewed, patient examined, no change in status, stable for surgery.  I have reviewed the patient's chart and labs.  Questions were answered to the patient's satisfaction.   ? ? ?Lyn Records III ? ? ?

## 2022-05-07 NOTE — ED Notes (Signed)
Cardio provider at bedside at this time °

## 2022-05-07 NOTE — H&P (View-Only) (Signed)
? ?Progress Note ? ?Patient Name: Stephanie Thornton ?Date of Encounter: 05/07/2022 ? ?[facility] Cardiologist: Armanda Magic, MD  ? ?Subjective  ? ?Admitted overnight with NSTEMI. High-sensitivity troponin >24,000. She reports fatigue and off and on chest heaviness for week but then had worsening chest pain Sunday. She reports associated shortness of breath and nausea. She is currently on IV Nitro drip and is still having 4-5/10 chest pain but appears comfortable and states it is much better than before. ? ?Inpatient Medications  ?  ?Scheduled Meds: ? amitriptyline  150 mg Oral Daily  ? aspirin EC  81 mg Oral Daily  ? atorvastatin  80 mg Oral Daily  ? busPIRone  10 mg Oral TID  ? gabapentin  300 mg Oral Daily  ? linaclotide  72 mcg Oral QAC breakfast  ? lurasidone  80 mg Oral Daily  ? topiramate  200 mg Oral Daily  ? traZODone  150 mg Oral Daily  ? ?Continuous Infusions: ? heparin 750 Units/hr (05/06/22 2158)  ? nitroGLYCERIN 200 mcg/min (05/07/22 0321)  ? ?PRN Meds: ?acetaminophen, HYDROmorphone (DILAUDID) injection, nitroGLYCERIN, ondansetron (ZOFRAN) IV, oxyCODONE  ? ?Vital Signs  ?  ?Vitals:  ? 05/07/22 0315 05/07/22 0330 05/07/22 0400 05/07/22 0430  ?BP: 113/76 118/73 123/77 121/81  ?Pulse: 89 90 90 89  ?Resp: (!) 9 10 14 14   ?Temp:      ?TempSrc:      ?SpO2: (!) 89% (!) 89% (!) 89% 91%  ?Weight:      ?Height:      ? ?No intake or output data in the 24 hours ending 05/07/22 0637 ? ?  05/06/2022  ?  8:27 PM 12/16/2020  ?  8:00 AM 03/18/2019  ?  9:01 AM  ?Last 3 Weights  ?Weight (lbs) 138 lb 154 lb 174 lb  ?Weight (kg) 62.596 kg 69.854 kg 78.926 kg  ?   ? ?Telemetry  ?  ?Sinus rhythm with rates in the 80s to 90s. - Personally Reviewed ? ?ECG  ?  ?Normal sinus rhythm with Q wave and slight flat ST elevation in lead III.  - Personally Reviewed ? ?Physical Exam  ? ?GEN: No acute distress.   ?Neck: No JVD. ?Cardiac: RRR. Radial pulses 2+ and equal bilaterally. No murmurs, rubs, or gallops.  ?Respiratory: Clear to  auscultation bilaterally. ?GI: Soft, no-distended, and non-tender.  ?MS: No lower extremity edema. No deformity. ?Skin: Warm and dry. ?Neuro:  No focal deficits.  ?Psych: Normal affect. Responds appropriately. ? ?Labs  ?  ?High Sensitivity Troponin:   ?Recent Labs  ?Lab 05/06/22 ?2019 05/06/22 ?2238  ?TROPONINIHS 7,600* >24,000*  ?   ?Chemistry ?Recent Labs  ?Lab 05/06/22 ?2019  ?NA 138  ?K 3.0*  ?CL 107  ?CO2 22  ?GLUCOSE 127*  ?BUN 6  ?CREATININE 0.87  ?CALCIUM 9.1  ?GFRNONAA >60  ?ANIONGAP 9  ?  ?Lipids  ?Recent Labs  ?Lab 05/07/22 ?0223  ?CHOL 111  ?TRIG 60  ?HDL 41  ?LDLCALC 58  ?CHOLHDL 2.7  ?  ?Hematology ?Recent Labs  ?Lab 05/06/22 ?2019 05/07/22 ?0223  ?WBC 11.6* 8.3  ?RBC 4.68 3.34*  ?HGB 12.9 9.4*  ?HCT 39.1 28.9*  ?MCV 83.5 86.5  ?MCH 27.6 28.1  ?MCHC 33.0 32.5  ?RDW 14.1 14.3  ?PLT 217 158  ? ?Thyroid No results for input(s): TSH, FREET4 in the last 168 hours.  ?BNPNo results for input(s): BNP, PROBNP in the last 168 hours.  ?DDimer No results for input(s): DDIMER in the last  168 hours.  ? ?Radiology  ?  ?DG Chest 2 View ? ?Result Date: 05/06/2022 ?CLINICAL DATA:  Chest pain. EXAM: CHEST - 2 VIEW COMPARISON:  Chest radiograph dated 05/19/2018. FINDINGS: Minimal bibasilar linear and platelike atelectasis/scarring. Developing infiltrate is less likely. No focal consolidation, pleural effusion, or pneumothorax. The cardiac silhouette is within normal limits. No acute osseous pathology. IMPRESSION: Minimal bibasilar atelectasis/scarring. No focal consolidation. Electronically Signed   By: Arash  Radparvar M.D.   On: 05/06/2022 21:34   ? ?Cardiac Studies  ? ?Cardiac Catheterization 05/20/2018: ?There is hyperdynamic left ventricular systolic function. ?The left ventricular ejection fraction is greater than 65% by visual estimate. ?LV end diastolic pressure is normal. ?There is no aortic valve stenosis. ?There is no mitral valve stenosis and no mitral valve prolapse evident. ?Mid RCA lesion is 30% stenosed. ?   ?Minimal, non-obstructive CAD ?Consider non-anginal chest pain ?Normal LVEF & LVEDP ? ?Patient Profile  ?   ?51 y.o. female with a history of non-obstructive CAD noted on cardiac catheterization in 04/2018, hypertension, hyperlipidemia, type 2 diabetes mellitus, asthma,  obstructive sleep apnea on CPAP, IBS depression/bipolar disorder, and chronic pain who was admitted with NSTEMI on 05/06/2022 after presenting with worsening chest pain. ? ?Assessment & Plan  ?  ?NSTEMI ?History of non-obstructive CAD on cath in 2019. Now presents with acute onset of left sided chest pain with radiation towards left arm. EKG shows no acute ischemic changes. High-sensitivity troponin elevated at 7,600 and then > 24,000.  ?- Still having 4-5/10 chest pain but appear comfortably. ?- Continue IV Heparin and IV Nitro. ?- Will check Echo. ?- Continue aspirin and high-sensitivity statin. Will add Lopressor 25mg twice daily. ?- Will need cardiac catheterization early this morning. Currently scheduled for 1st case with Dr. Smith. The patient understands that risks include but are not limited to stroke (1 in 1000), death (1 in 1000), kidney failure [usually temporary] (1 in 500), bleeding (1 in 200), allergic reaction [possibly serious] (1 in 200), and agrees to proceed.  ? ?Hypertension ?BP mildly elevated at times. ?- On Olmesartan-Amlodipine-HCTZ 40-10-25mg daily at home. Currently on hold until after cath. ?- Will add Lopressor as above. ?- Also currently on Nitro drip. ? ?Hyperlipidemia ?LDL as high as 188 in the past but currently well controlled. Lipid panel this admission: Total Cholesterol 111, Triglycerides 60, HDL 41, LDL 58.  ?- Started on Lipitor 80mg daily. ?- Will need repeat lipid panel and LFTs in 6-8 weeks. ? ?Type 2 Diabetes Mellitus ?Peripheral Neuropathy ?Hemoglobin A1c 9.4. ?- Continue sliding scale insulin here. ?- Will consult Diabetes Coordinator.  ? ?Abnormal TSH ?TSH low at 0.190.  ?- Will check free T4 and free  T3. ? ?Obstructive Sleep Apnea ?Continue CPAP at night. ? ?Asthma ?Continue albuterol as needed. ? ?Depression ?Bipolar Disorder ?Stable. ?- Continue home medications: Buspar 10mg three times daily, Amitriptyline 150mg daily, Latuda 80mg daily, Topamax 200mg daily, and Trazadone 150mg daily. ? ?IBS ?- Continue home Linaclotide 72mcg daily. ? ?Chronic Pain ?- Continue home Oxycodone 15mg every 6 hours as needed for pain. ? ?For questions or updates, please contact [facility] ?Please consult [email or web address] for contact info under  ? ?  ?   ?Signed, ?Callie E Goodrich, PA-C  ?05/07/2022, 6:37 AM   ? ?I have personally seen and examined this patient. I agree with the assessment and plan as outlined above.  ?She is known to have CAD, DM, HTN, HLD presenting to the ED with chest pain. Troponin over 24,000.   EKG with sinus with poor R wave progression.  ?Exam with RRR and clear lungs ?Plan: NSTEMI: Cardiac cath this am. Continue IV NTG and IV heparin pre cath.  ? I have reviewed the risks, indications, and alternatives to cardiac catheterization, possible angioplasty, and stenting with the patient. Risks include but are not limited to bleeding, infection, vascular injury, stroke, myocardial infection, arrhythmia, kidney injury, radiation-related injury in the case of prolonged fluoroscopy use, emergency cardiac surgery, and death. The patient understands the risks of serious complication is 1-2 in 1000 with diagnostic cardiac cath and 1-2% or less with angioplasty/stenting. ? ? ?Verne Carrow, MD, [facility] ?05/07/2022 ?7:34 AM ? ? ?

## 2022-05-07 NOTE — ED Notes (Addendum)
Pt and family would like to setup a password. The password will be Delores. The main contact is Tama Gander sister [phone]. If unable to reach her please call Bridgett Larsson brother [phone]. ?

## 2022-05-07 NOTE — Progress Notes (Signed)
Inpatient Diabetes Program Recommendations ? ?AACE/ADA: New Consensus Statement on Inpatient Glycemic Control (2015) ? ?Target Ranges:  Prepandial:   less than 140 mg/dL ?     Peak postprandial:   less than 180 mg/dL (1-2 hours) ?     Critically ill patients:  140 - 180 mg/dL  ? ?Lab Results  ?Component Value Date  ? GLUCAP 155 (H) 12/16/2020  ? HGBA1C 9.4 (H) 05/07/2022  ? ? ?Review of Glycemic Control ? ?Diabetes history: DM2 ?Outpatient Diabetes medications: Farxiga 10 mg QD, Novolog 1 units QID, Ozempic 1 mg weekly ?Current orders for Inpatient glycemic control: None ? ?HgbA1C - 9.4% ? ?Inpatient Diabetes Program Recommendations:   ? ?Consider adding Novolog 0-9 units TID with meals and 0-5 HS ?Add CHO-mod to heart healthy diet ? ?Will speak with pt later today about her diabetes control and HgbA1C of 9.4%. ? ?Follow glucose trends. ? ?Thank you. ?Ailene Ards, RD, LDN, CDE ?Inpatient Diabetes Coordinator ?[phone]  ? ? ? ? ?

## 2022-05-07 NOTE — CV Procedure (Signed)
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  First obtuse marginal contains 95 to 99% stenosis and a dominant obtuse marginal and 2 acute bends.  Wonder if the vessel was initially totally occluded and recanalized with in-hospital therapy. ?Successful PCI on the first obtuse marginal reducing 95% stenosis to 0% with TIMI grade III flow.  No attempt was made to open the smaller continuation of the circumflex with troponins greater than 24,000.  Syntax score is high.  Due to right angle circumflex followed by right angle entry into the obtuse marginal ?Left main and LAD are widely patent. ?Luminal irregularities in the right coronary. ?Mid anterior wall akinesis.  LVEDP 14 mmHg. ? ?Late presenting MI.  Perhaps the obtuse marginal was totally occluded and recanalized.  The greater than 24,000 troponin I suggest possibly a completed lateral wall infarct.  PCI performed because of high-grade stenosis in the obtuse marginal and risk of reocclusion.  Difficult to know if the marginal or if to totally occluded mid circumflex accounts for her presentation ? ?

## 2022-05-07 NOTE — Progress Notes (Signed)
ANTICOAGULATION CONSULT NOTE - Initial Consult ? ?Pharmacy Consult for Tirofiban  ?Indication: chest pain/ACS ? ?Allergies  ?Allergen Reactions  ? Lyrica [Pregabalin] Shortness Of Breath and Rash  ? Rocephin [Ceftriaxone Sodium In Dextrose] Shortness Of Breath  ? Tramadol Shortness Of Breath  ?  Had asthma attack  ? Vancomycin Anaphylaxis  ?  Per Dr. Henderson Cloud  ? Glipizide Hives  ? Keflex [Cephalexin] Hives  ? Penicillins Hives  ?  Has patient had a PCN reaction causing immediate rash, facial/tongue/throat swelling, SOB or lightheadedness with hypotension: YES ?Has patient had a PCN reaction causing severe rash involving mucus membranes or skin necrosis: NO ?Has patient had a PCN reaction that required hospitalization: YES ?Has patient had a PCN reaction occurring within the last 10 years: YES ?If all of the above answers are "NO", then may proceed with Cephalosporin use.  ? Sulfa Antibiotics Hives  ? Sitagliptin-Metformin Hcl Nausea Only  ? ? ?Patient Measurements: ?Height: 5\' 2"  (157.5 cm) ?Weight: 62.6 kg (138 lb) ?IBW/kg (Calculated) : 50.1 ? ? ?Vital Signs: ?Temp: 97.6 ?F (36.4 ?C) (05/09 [id number]) ?Temp Source: Oral (05/09 [id number]) ?BP: 152/103 (05/09 1030) ?Pulse Rate: 96 (05/09 1030) ? ?Labs: ?Recent Labs  ?  05/06/22 ?2019 05/06/22 ?2238 05/07/22 ?[id number] 05/07/22 ?0734  ?HGB 12.9  --  9.4*  --   ?HCT 39.1  --  28.9*  --   ?PLT 217  --  158  --   ?LABPROT 13.4  --   --   --   ?INR 1.0  --   --   --   ?HEPARINUNFRC  --   --  >1.10* 0.27*  ?CREATININE 0.87  --   --   --   ?TROPONINIHS 7,600* >24,000*  --   --   ? ? ?Estimated Creatinine Clearance: 68 mL/min (by C-G formula based on SCr of 0.87 mg/dL). ? ? ?Medical History: ?Past Medical History:  ?Diagnosis Date  ? Anxiety   ? Arthritis   ? "knees, feet, back" (05/24/2015)  ? Benign essential HTN 05/01/2015  ? Bipolar disorder ([place]) diagnosed early 10s  ? Chronic bronchitis ([place])   ? "get it q yr"  ? Chronic lower back pain   ? Depression   ? Diabetes mellitus type II 2010  ?  GERD (gastroesophageal reflux disease)   ? Headache   ? "maybe 3 times/wk" (05/24/2015)  ? History of hiatal hernia   ? History of stomach ulcers   ? Menopause   ? Migraine   ? "1-2/wk" (05/24/2015)  ? OSA on CPAP   ? ? ?Assessment: ?62 YOF presented with ACS/NSTEMI, now s/p cath. Pharmacy consulted to continue aggrastat until 2 hours following Brillinta load (loaded at 0930 on 5/9).  ? ?Hgb decreased from 12.9>9.4 post cath- monitor.  ? ?Goal of Therapy:  ?Monitor platelets by anticoagulation protocol: Yes ?  ?Plan:  ?STOP tirofiban at 1130 hours on 5/9  ?Monitor for signs and symptoms of bleeding  ? ?Adria Dill, PharmD ?PGY-1 Acute Care Resident  ?05/07/2022 10:42 AM  ? ? ? ?

## 2022-05-07 NOTE — ED Notes (Signed)
Cardio at bedside

## 2022-05-08 DIAGNOSIS — I10 Essential (primary) hypertension: Secondary | ICD-10-CM

## 2022-05-08 LAB — BASIC METABOLIC PANEL
Anion gap: 6 (ref 5–15)
BUN: 8 mg/dL (ref 6–20)
CO2: 19 mmol/L — ABNORMAL LOW (ref 22–32)
Calcium: 8.3 mg/dL — ABNORMAL LOW (ref 8.9–10.3)
Chloride: 108 mmol/L (ref 98–111)
Creatinine, Ser: 0.61 mg/dL (ref 0.44–1.00)
GFR, Estimated: >60 mL/min (ref >60)
Glucose, Bld: 279 mg/dL — ABNORMAL HIGH (ref 70–99)
Potassium: 4.1 mmol/L (ref 3.5–5.1)
Sodium: 133 mmol/L — ABNORMAL LOW (ref 135–145)

## 2022-05-08 LAB — CBC
HCT: 33.3 % — ABNORMAL LOW (ref 36.0–46.0)
Hemoglobin: 11.1 g/dL — ABNORMAL LOW (ref 12.0–15.0)
MCH: 28 pg (ref 26.0–34.0)
MCHC: 33.3 g/dL (ref 30.0–36.0)
MCV: 84.1 fL (ref 80.0–100.0)
Platelets: 181 10*3/uL (ref 150–400)
RBC: 3.96 MIL/uL (ref 3.87–5.11)
RDW: 14.4 % (ref 11.5–15.5)
WBC: 9.8 10*3/uL (ref 4.0–10.5)
nRBC: 0 % (ref 0.0–0.2)

## 2022-05-08 LAB — GLUCOSE, CAPILLARY
Glucose-Capillary: 296 mg/dL — ABNORMAL HIGH (ref 70–99)
Glucose-Capillary: 222 mg/dL — ABNORMAL HIGH (ref 70–99)
Glucose-Capillary: 184 mg/dL — ABNORMAL HIGH (ref 70–99)
Glucose-Capillary: 295 mg/dL — ABNORMAL HIGH (ref 70–99)

## 2022-05-08 LAB — T3, FREE: T3, Free: 2.7 pg/mL (ref 2.0–4.4)

## 2022-05-08 LAB — LIPOPROTEIN A (LPA): Lipoprotein (a): 245.4 nmol/L — ABNORMAL HIGH (ref <75.0)

## 2022-05-08 MED ORDER — INSULIN GLARGINE-YFGN 100 UNIT/ML ~~LOC~~ SOLN
5.0000 [IU] | Freq: Every day | SUBCUTANEOUS | Status: DC
  Administered 2022-05-08 – 2022-05-09 (×2): 5 [IU] via SUBCUTANEOUS
  Filled 2022-05-08 (×2): qty 0.05

## 2022-05-08 MED ORDER — FUROSEMIDE 10 MG/ML IJ SOLN
40.0000 mg | Freq: Once | INTRAMUSCULAR | Status: AC
  Administered 2022-05-08: 40 mg via INTRAVENOUS
  Filled 2022-05-08: qty 4

## 2022-05-08 MED ORDER — DIPHENHYDRAMINE HCL 25 MG PO CAPS
25.0000 mg | ORAL_CAPSULE | Freq: Four times a day (QID) | ORAL | Status: DC | PRN
  Administered 2022-05-08: 25 mg via ORAL
  Filled 2022-05-08: qty 1

## 2022-05-08 NOTE — Progress Notes (Signed)
CARDIAC REHAB PHASE I  ? ?Began MI/stent education with pt. Pt given MI book along with heart healthy and diabetic diets. Pt struggling to stay awake during session. Will f/u to complete as time allows. ? ?[id number] ?Reynold Bowen, RN BSN ?05/08/2022 ?1:26 PM ? ?

## 2022-05-08 NOTE — Progress Notes (Signed)
Inpatient Diabetes Program Recommendations ? ?AACE/ADA: New Consensus Statement on Inpatient Glycemic Control (2015) ? ?Target Ranges:  Prepandial:   less than 140 mg/dL ?     Peak postprandial:   less than 180 mg/dL (1-2 hours) ?     Critically ill patients:  140 - 180 mg/dL  ? ?Lab Results  ?Component Value Date  ? GLUCAP 295 (H) 05/08/2022  ? HGBA1C 9.4 (H) 05/07/2022  ? ? ?Review of Glycemic Control ? ?Diabetes history: DM2 ?Outpatient Diabetes medications: Farxiga 10 mg QD, Novolog (in OmniPod) 1 click QID, Ozempic 1 mg weekly ?Current orders for Inpatient glycemic control: Semglee 5 QD, Novolog 0-15 units TID with  ? ?CBGs today 296, 222, 184, 295 mg/dL ?HgbA1C - 9.4% ? ?Inpatient Diabetes Program Recommendations:   ? ?Increase Semglee to 10 units QD ? ?Add Novolog 3 units TID with meals if eating > 50% ? ?Spoke with pt at bedside earlier today. Pt has OmniPod and gives insulin QID (could not find how much each day from EMR. ?Pt states she drinks at least 3 [facility] per day. Was not open to other substitues, but states she may try some of the diet sodas. Noted pt eating pancakes, sausage and syrup for breakfast. Discussed HgbA1C and goals. Stressed importance of improving blood sugars when discharged. To f/u with PCP. Very little exercise. Discussed healthy diet for blood sugar control and role exercise plays in glucose management. Pt somewhat receptive to ideas to improve her blood sugars. Monitors blood sugars daily and discussed how poor diabetes control can lead to short and long term complications. Answered all questions. ? ?Continue to follow and titrate insulin for blood sugar control. ? ?Thank you. ?Ailene Ards, RD, LDN, CDE ?Inpatient Diabetes Coordinator ?[phone]  ? ? ? ? ? ? ?

## 2022-05-08 NOTE — TOC Progression Note (Signed)
Transition of Care (TOC) - Progression Note  ? ? ?Patient Details  ?Name: Ameia Morency ?MRN: [id number] ?Date of Birth: 1971/02/27 ? ?Transition of Care (TOC) CM/SW Contact  ?Leone Haven, RN ?Phone Number: ?05/08/2022, 3:06 PM ? ?Clinical Narrative:    ?From home, NSTEMI, s/p PCI, with stent, on ASA and brilinta, he has zero dollar copay for brilinta.  TOC will continue to follow for dc needs.  ? ? ?  ?  ? ?Expected Discharge Plan and Services ?  ?  ?  ?  ?  ?                ?  ?  ?  ?  ?  ?  ?  ?  ?  ?  ? ? ?Social Determinants of Health (SDOH) Interventions ?  ? ?Readmission Risk Interventions ?   ? View : No data to display.  ?  ?  ?  ? ? ?

## 2022-05-08 NOTE — Progress Notes (Signed)
? ?Progress Note ? ?Patient Name: Stephanie Thornton ?Date of Encounter: 05/08/2022 ? ?[facility] Cardiologist: Fransico Him, MD  ? ?Subjective  ? ?No chest pain or dyspnea ? ?Inpatient Medications  ?  ?Scheduled Meds: ? amitriptyline  150 mg Oral Daily  ? amLODipine  10 mg Oral Daily  ? aspirin  81 mg Oral Daily  ? atorvastatin  80 mg Oral Daily  ? busPIRone  10 mg Oral TID  ? Chlorhexidine Gluconate Cloth  6 each Topical Daily  ? enoxaparin (LOVENOX) injection  40 mg Subcutaneous Q24H  ? gabapentin  300 mg Oral Daily  ? insulin aspart  0-15 Units Subcutaneous TID WC  ? irbesartan  300 mg Oral Daily  ? linaclotide  72 mcg Oral QAC breakfast  ? lurasidone  80 mg Oral Daily  ? metoprolol tartrate  25 mg Oral BID  ? sodium chloride flush  3 mL Intravenous Q12H  ? sodium chloride flush  3 mL Intravenous Q12H  ? ticagrelor  90 mg Oral BID  ? topiramate  200 mg Oral Daily  ? traZODone  150 mg Oral Daily  ? ?Continuous Infusions: ? sodium chloride    ? sodium chloride    ? sodium chloride    ? nitroGLYCERIN Stopped (05/07/22 1755)  ? ?PRN Meds: ?sodium chloride, sodium chloride, acetaminophen, HYDROmorphone (DILAUDID) injection, nitroGLYCERIN, ondansetron (ZOFRAN) IV, oxyCODONE, oxyCODONE, sodium chloride flush, sodium chloride flush  ? ?Vital Signs  ?  ?Vitals:  ? 05/08/22 0600 05/08/22 0700 05/08/22 0800 05/08/22 0815  ?BP: 115/71 110/75 113/83   ?Pulse: 75 76 75   ?Resp: (!) '26 17 16   ' ?Temp:    98 ?F (36.7 ?C)  ?TempSrc:    Oral  ?SpO2: 97% 98% 97%   ?Weight:      ?Height:      ? ? ?Intake/Output Summary (Last 24 hours) at 05/08/2022 0825 ?Last data filed at 05/07/2022 2200 ?Gross per 24 hour  ?Intake 1755.62 ml  ?Output 150 ml  ?Net 1605.62 ml  ? ? ?  05/08/2022  ?  5:00 AM 05/06/2022  ?  8:27 PM 12/16/2020  ?  8:00 AM  ?Last 3 Weights  ?Weight (lbs) 115 lb 15.4 oz 138 lb 154 lb  ?Weight (kg) 52.6 kg 62.596 kg 69.854 kg  ?   ? ?Telemetry  ?  ?sinus - Personally Reviewed ? ?ECG  ?  ? ?EKG today with sinus, non-specific T  wave abnormality ? ?Physical Exam  ? ?General: Well developed, well nourished, NAD  ?HEENT: OP clear, mucus membranes moist  ?SKIN: warm, dry. No rashes. ?Neuro: No focal deficits  ?Musculoskeletal: Muscle strength 5/5 all ext  ?Psychiatric: Mood and affect normal  ?Neck: No JVD ?Lungs:Basilar crackles. ?Cardiovascular: Regular rate and rhythm. No murmurs, gallops or rubs. ?Abdomen:Soft. Bowel sounds present. Non-tender.  ?Extremities: No lower extremity edema ? ?Labs  ?  ?High Sensitivity Troponin:   ?Recent Labs  ?Lab 05/06/22 ?2019 05/06/22 ?2238  ?TROPONINIHS 7,600* >24,000*  ?   ?Chemistry ?Recent Labs  ?Lab 05/06/22 ?2019 05/07/22 ?0258 05/07/22 ?5277 05/08/22 ?0302  ?NA 138  --   --  133*  ?K 3.0*  --  3.6 4.1  ?CL 107  --   --  108  ?CO2 22  --   --  19*  ?GLUCOSE 127*  --   --  279*  ?BUN 6  --   --  8  ?CREATININE 0.87 0.80  --  0.61  ?CALCIUM 9.1  --   --  8.3*  ?MG  --   --  1.7  --   ?GFRNONAA >60 >60  --  >60  ?ANIONGAP 9  --   --  6  ?  ?Lipids  ?Recent Labs  ?Lab 05/07/22 ?0223  ?CHOL 111  ?TRIG 60  ?HDL 41  ?[place] 58  ?CHOLHDL 2.7  ?  ?Hematology ?Recent Labs  ?Lab 05/06/22 ?2019 05/07/22 ?0223 05/08/22 ?9892  ?WBC 11.6* 8.3 9.8  ?RBC 4.68 3.34* 3.96  ?HGB 12.9 9.4* 11.1*  ?HCT 39.1 28.9* 33.3*  ?MCV 83.5 86.5 84.1  ?MCH 27.6 28.1 28.0  ?MCHC 33.0 32.5 33.3  ?RDW 14.1 14.3 14.4  ?PLT 217 158 181  ? ?Thyroid  ?Recent Labs  ?Lab 05/07/22 ?0223 05/07/22 ?0734  ?TSH 0.190*  --   ?FREET4  --  0.66  ?  ?BNPNo results for input(s): BNP, PROBNP in the last 168 hours.  ?DDimer No results for input(s): DDIMER in the last 168 hours.  ? ?Radiology  ?  ?DG Chest 2 View ? ?Result Date: 05/06/2022 ?CLINICAL DATA:  Chest pain. EXAM: CHEST - 2 VIEW COMPARISON:  Chest radiograph dated 05/19/2018. FINDINGS: Minimal bibasilar linear and platelike atelectasis/scarring. Developing infiltrate is less likely. No focal consolidation, pleural effusion, or pneumothorax. The cardiac silhouette is within normal limits. No acute  osseous pathology. IMPRESSION: Minimal bibasilar atelectasis/scarring. No focal consolidation. Electronically Signed   By: Anner Crete M.D.   On: 05/06/2022 21:34  ? ?CARDIAC CATHETERIZATION ? ?Result Date: 05/07/2022 ?CONCLUSIONS: Total occlusion of the circumflex beyond the origin of OM1 takeoff and 99% stenosis in the proximal obtuse marginal with very high syntax due to acute angulation at origin of circumflex from left main and at origin of obtuse marginal from circumflex. 99% stenosis reduced to 0% stenosis with TIMI grade III flow using a 24 x 3.0 Synergy postdilated to 3.5 atm. Widely patent left main, LAD, and right coronary, each with minimal luminal irregularity. Mid anterior wall akinesis.  Normal anterobasal and apical contractility and hypokinesis of the inferior wall.  EF 55 to 60%.  EDP 14 mmHg. RECOMMENDATIONS: She has had a completed infarct.  Possibly had recanalization of the totally occluded first obtuse marginal.  With LV hypokinesis and late presentation, there is some potential risk of mechanical complication such as free wall rupture. Beta-blocker therapy Blood pressure control High intensity statin therapy Aspirin and Brilinta x12 months Discontinue Aggrastat 2 hours after Brilinta load which was given in the Cath Lab. 2D Doppler echocardiogram.  ? ?ECHOCARDIOGRAM COMPLETE ? ?Result Date: 05/07/2022 ?   ECHOCARDIOGRAM REPORT   Patient Name:   Stephanie Thornton Date of Exam: 05/07/2022 Medical Rec #:  [id number]         Height:       62.0 in Accession #:    [id number]        Weight:       138.0 lb Date of Birth:  Apr 01, 1971         BSA:          1.633 m? Patient Age:    51 years          BP:           144/106 mmHg Patient Gender: F                 HR:           97 bpm. Exam Location:  Inpatient Procedure: 2D Echo, Cardiac Doppler and Color Doppler Indications:    Acute  ischemic heart disease  History:        Patient has prior history of Echocardiogram examinations, most                 recent  05/16/2018. Risk Factors:Diabetes and Hypertension.  Sonographer:    Jefferey Pica Referring Phys: [id number] [place]  1. Left ventricular ejection fraction, by estimation, is 50 to 55%. The left ventricle has low normal function. The left ventricle demonstrates regional wall motion abnormalities (see scoring diagram/findings for description). There is mild concentric left ventricular hypertrophy. Indeterminate diastolic filling due to E-A fusion.  2. Right ventricular systolic function is normal. The right ventricular size is normal. There is mildly elevated pulmonary artery systolic pressure. The estimated right ventricular systolic pressure is 84.5 mmHg.  3. The mitral valve is grossly normal. Trivial mitral valve regurgitation. No evidence of mitral stenosis.  4. The aortic valve is tricuspid. Aortic valve regurgitation is not visualized. No aortic stenosis is present.  5. The inferior vena cava is dilated in size with <50% respiratory variability, suggesting right atrial pressure of 15 mmHg. FINDINGS  Left Ventricle: Left ventricular ejection fraction, by estimation, is 50 to 55%. The left ventricle has low normal function. The left ventricle demonstrates regional wall motion abnormalities. The left ventricular internal cavity size was normal in size. There is mild concentric left ventricular hypertrophy. Indeterminate diastolic filling due to E-A fusion.  LV Wall Scoring: The antero-lateral wall and posterior wall are hypokinetic. Right Ventricle: The right ventricular size is normal. No increase in right ventricular wall thickness. Right ventricular systolic function is normal. There is mildly elevated pulmonary artery systolic pressure. The tricuspid regurgitant velocity is 2.46  m/s, and with an assumed right atrial pressure of 15 mmHg, the estimated right ventricular systolic pressure is 73.3 mmHg. Left Atrium: Left atrial size was normal in size. Right Atrium: Right atrial size was  normal in size. Pericardium: Trivial pericardial effusion is present. Presence of epicardial fat layer. Mitral Valve: The mitral valve is grossly normal. Trivial mitral valve regurgitation. No evidenc

## 2022-05-09 ENCOUNTER — Other Ambulatory Visit (HOSPITAL_COMMUNITY): Payer: Self-pay

## 2022-05-09 LAB — BASIC METABOLIC PANEL
Anion gap: 6 (ref 5–15)
BUN: 12 mg/dL (ref 6–20)
CO2: 19 mmol/L — ABNORMAL LOW (ref 22–32)
Calcium: 8.8 mg/dL — ABNORMAL LOW (ref 8.9–10.3)
Chloride: 112 mmol/L — ABNORMAL HIGH (ref 98–111)
Creatinine, Ser: 0.74 mg/dL (ref 0.44–1.00)
GFR, Estimated: >60 mL/min (ref >60)
Glucose, Bld: 267 mg/dL — ABNORMAL HIGH (ref 70–99)
Potassium: 3.9 mmol/L (ref 3.5–5.1)
Sodium: 137 mmol/L (ref 135–145)

## 2022-05-09 LAB — LIPOPROTEIN A (LPA): Lipoprotein (a): 262.8 nmol/L — ABNORMAL HIGH (ref <75.0)

## 2022-05-09 LAB — CBC
HCT: 35.2 % — ABNORMAL LOW (ref 36.0–46.0)
Hemoglobin: 11.7 g/dL — ABNORMAL LOW (ref 12.0–15.0)
MCH: 27.6 pg (ref 26.0–34.0)
MCHC: 33.2 g/dL (ref 30.0–36.0)
MCV: 83 fL (ref 80.0–100.0)
Platelets: 196 10*3/uL (ref 150–400)
RBC: 4.24 MIL/uL (ref 3.87–5.11)
RDW: 14.4 % (ref 11.5–15.5)
WBC: 8.3 10*3/uL (ref 4.0–10.5)
nRBC: 0 % (ref 0.0–0.2)

## 2022-05-09 LAB — GLUCOSE, CAPILLARY: Glucose-Capillary: 253 mg/dL — ABNORMAL HIGH (ref 70–99)

## 2022-05-09 MED ORDER — ATORVASTATIN CALCIUM 80 MG PO TABS
80.0000 mg | ORAL_TABLET | Freq: Every day | ORAL | 3 refills | Status: DC
  Filled 2022-05-09 – 2022-06-04 (×2): qty 30, 30d supply, fill #0
  Filled 2022-07-04: qty 30, 30d supply, fill #1

## 2022-05-09 MED ORDER — OLMESARTAN MEDOXOMIL 40 MG PO TABS
40.0000 mg | ORAL_TABLET | Freq: Every day | ORAL | 3 refills | Status: DC
  Filled 2022-05-09 – 2022-06-04 (×2): qty 30, 30d supply, fill #0
  Filled 2022-07-04: qty 30, 30d supply, fill #1

## 2022-05-09 MED ORDER — NITROGLYCERIN 0.4 MG SL SUBL
0.4000 mg | SUBLINGUAL_TABLET | SUBLINGUAL | 2 refills | Status: AC | PRN
  Filled 2022-05-09: qty 25, 14d supply, fill #0
  Filled 2022-06-04: qty 25, 30d supply, fill #0
  Filled 2022-07-04: qty 25, 30d supply, fill #1

## 2022-05-09 MED ORDER — ASPIRIN 81 MG PO CHEW
81.0000 mg | CHEWABLE_TABLET | Freq: Every day | ORAL | 3 refills | Status: AC
Start: 2022-05-09 — End: ?
  Filled 2022-05-09 – 2022-07-04 (×3): qty 30, 30d supply, fill #0

## 2022-05-09 MED ORDER — AMLODIPINE BESYLATE 10 MG PO TABS
10.0000 mg | ORAL_TABLET | Freq: Every day | ORAL | 3 refills | Status: DC
  Filled 2022-05-09 – 2022-06-04 (×3): qty 30, 30d supply, fill #0
  Filled 2022-07-04: qty 30, 30d supply, fill #1
  Filled 2022-08-13: qty 30, 30d supply, fill #2

## 2022-05-09 MED ORDER — TICAGRELOR 90 MG PO TABS
90.0000 mg | ORAL_TABLET | Freq: Two times a day (BID) | ORAL | 3 refills | Status: DC
Start: 2022-05-09 — End: 2023-09-07
  Filled 2022-05-09 – 2022-06-04 (×2): qty 60, 30d supply, fill #0

## 2022-05-09 MED ORDER — POTASSIUM CHLORIDE CRYS ER 20 MEQ PO TBCR
20.0000 meq | EXTENDED_RELEASE_TABLET | Freq: Once | ORAL | Status: AC
  Administered 2022-05-09: 20 meq via ORAL
  Filled 2022-05-09: qty 1

## 2022-05-09 MED ORDER — METOPROLOL TARTRATE 25 MG PO TABS
25.0000 mg | ORAL_TABLET | Freq: Two times a day (BID) | ORAL | 3 refills | Status: DC
  Filled 2022-05-09 – 2022-06-04 (×2): qty 60, 30d supply, fill #0
  Filled 2022-07-04: qty 60, 30d supply, fill #1
  Filled 2022-08-13: qty 60, 30d supply, fill #2

## 2022-05-09 NOTE — Progress Notes (Signed)
? ?Progress Note ? ?Patient Name: Stephanie Thornton ?Date of Encounter: 05/09/2022 ? ?[facility] Cardiologist: Fransico Him, MD  ? ?Subjective  ? ?No chest pain or dyspnea this am.  ? ?Inpatient Medications  ?  ?Scheduled Meds: ? amitriptyline  150 mg Oral Daily  ? amLODipine  10 mg Oral Daily  ? aspirin  81 mg Oral Daily  ? atorvastatin  80 mg Oral Daily  ? busPIRone  10 mg Oral TID  ? Chlorhexidine Gluconate Cloth  6 each Topical Daily  ? enoxaparin (LOVENOX) injection  40 mg Subcutaneous Q24H  ? gabapentin  300 mg Oral Daily  ? insulin aspart  0-15 Units Subcutaneous TID WC  ? insulin glargine-yfgn  5 Units Subcutaneous Daily  ? irbesartan  300 mg Oral Daily  ? lurasidone  80 mg Oral Daily  ? metoprolol tartrate  25 mg Oral BID  ? sodium chloride flush  3 mL Intravenous Q12H  ? ticagrelor  90 mg Oral BID  ? topiramate  200 mg Oral Daily  ? traZODone  150 mg Oral Daily  ? ?Continuous Infusions: ? sodium chloride    ? nitroGLYCERIN Stopped (05/07/22 1755)  ? ?PRN Meds: ?sodium chloride, acetaminophen, diphenhydrAMINE, HYDROmorphone (DILAUDID) injection, nitroGLYCERIN, ondansetron (ZOFRAN) IV, oxyCODONE, oxyCODONE, sodium chloride flush  ? ?Vital Signs  ?  ?Vitals:  ? 05/08/22 1600 05/08/22 2025 05/09/22 0159 05/09/22 0726  ?BP: 117/84 136/88 110/62 120/76  ?Pulse: 74 83 86 69  ?Resp:  '18 18 18  ' ?Temp:  98 ?F (36.7 ?C) 98.3 ?F (36.8 ?C) 98.3 ?F (36.8 ?C)  ?TempSrc:  Oral Oral Oral  ?SpO2: 96% 99% 98% 97%  ?Weight:   52.1 kg   ?Height:      ? ? ?Intake/Output Summary (Last 24 hours) at 05/09/2022 0812 ?Last data filed at 05/09/2022 0203 ?Gross per 24 hour  ?Intake 837 ml  ?Output 1200 ml  ?Net -363 ml  ? ? ?  05/09/2022  ?  1:59 AM 05/08/2022  ?  5:00 AM 05/06/2022  ?  8:27 PM  ?Last 3 Weights  ?Weight (lbs) 114 lb 13.8 oz 115 lb 15.4 oz 138 lb  ?Weight (kg) 52.1 kg 52.6 kg 62.596 kg  ?   ? ?Telemetry  ?  ?sinus - Personally Reviewed ? ?ECG  ?  ? ?No AM EKG ? ?Physical Exam  ? ?General: Well developed, well nourished,  NAD  ?HEENT: OP clear, mucus membranes moist  ?SKIN: warm, dry. No rashes. ?Neuro: No focal deficits  ?Musculoskeletal: Muscle strength 5/5 all ext  ?Psychiatric: Mood and affect normal  ?Neck: No JVD, no carotid bruits, no thyromegaly, no lymphadenopathy.  ?Lungs:Clear bilaterally, no wheezes, rhonci, crackles ?Cardiovascular: Regular rate and rhythm. No murmurs, gallops or rubs. ?Abdomen:Soft. Bowel sounds present. Non-tender.  ?Extremities: No lower extremity edema. Pulses are 2 + in the bilateral DP/PT. ? ? ?Labs  ?  ?High Sensitivity Troponin:   ?Recent Labs  ?Lab 05/06/22 ?2019 05/06/22 ?2238  ?TROPONINIHS 7,600* >24,000*  ?   ?Chemistry ?Recent Labs  ?Lab 05/06/22 ?2019 05/07/22 ?0626 05/07/22 ?9485 05/08/22 ?0302 05/09/22 ?0409  ?NA 138  --   --  133* 137  ?K 3.0*  --  3.6 4.1 3.9  ?CL 107  --   --  108 112*  ?CO2 22  --   --  19* 19*  ?GLUCOSE 127*  --   --  279* 267*  ?BUN 6  --   --  8 12  ?CREATININE 0.87 0.80  --  0.61 0.74  ?CALCIUM 9.1  --   --  8.3* 8.8*  ?MG  --   --  1.7  --   --   ?GFRNONAA >60 >60  --  >60 >60  ?ANIONGAP 9  --   --  6 6  ?  ?Lipids  ?Recent Labs  ?Lab 05/07/22 ?0223  ?CHOL 111  ?TRIG 60  ?HDL 41  ?[place] 58  ?CHOLHDL 2.7  ?  ?Hematology ?Recent Labs  ?Lab 05/07/22 ?0223 05/08/22 ?2707 05/09/22 ?0409  ?WBC 8.3 9.8 8.3  ?RBC 3.34* 3.96 4.24  ?HGB 9.4* 11.1* 11.7*  ?HCT 28.9* 33.3* 35.2*  ?MCV 86.5 84.1 83.0  ?MCH 28.1 28.0 27.6  ?MCHC 32.5 33.3 33.2  ?RDW 14.3 14.4 14.4  ?PLT 158 181 196  ? ?Thyroid  ?Recent Labs  ?Lab 05/07/22 ?0223 05/07/22 ?0734  ?TSH 0.190*  --   ?FREET4  --  0.66  ?  ?BNPNo results for input(s): BNP, PROBNP in the last 168 hours.  ?DDimer No results for input(s): DDIMER in the last 168 hours.  ? ?Radiology  ?  ?CARDIAC CATHETERIZATION ? ?Result Date: 05/07/2022 ?CONCLUSIONS: Total occlusion of the circumflex beyond the origin of OM1 takeoff and 99% stenosis in the proximal obtuse marginal with very high syntax due to acute angulation at origin of circumflex  from left main and at origin of obtuse marginal from circumflex. 99% stenosis reduced to 0% stenosis with TIMI grade III flow using a 24 x 3.0 Synergy postdilated to 3.5 atm. Widely patent left main, LAD, and right coronary, each with minimal luminal irregularity. Mid anterior wall akinesis.  Normal anterobasal and apical contractility and hypokinesis of the inferior wall.  EF 55 to 60%.  EDP 14 mmHg. RECOMMENDATIONS: She has had a completed infarct.  Possibly had recanalization of the totally occluded first obtuse marginal.  With LV hypokinesis and late presentation, there is some potential risk of mechanical complication such as free wall rupture. Beta-blocker therapy Blood pressure control High intensity statin therapy Aspirin and Brilinta x12 months Discontinue Aggrastat 2 hours after Brilinta load which was given in the Cath Lab. 2D Doppler echocardiogram.  ? ?ECHOCARDIOGRAM COMPLETE ? ?Result Date: 05/07/2022 ?   ECHOCARDIOGRAM REPORT   Patient Name:   Stephanie Thornton Date of Exam: 05/07/2022 Medical Rec #:  [id number]         Height:       62.0 in Accession #:    [id number]        Weight:       138.0 lb Date of Birth:  03-04-1971         BSA:          1.633 m? Patient Age:    51 years          BP:           144/106 mmHg Patient Gender: F                 HR:           97 bpm. Exam Location:  Inpatient Procedure: 2D Echo, Cardiac Doppler and Color Doppler Indications:    Acute ischemic heart disease  History:        Patient has prior history of Echocardiogram examinations, most                 recent 05/16/2018. Risk Factors:Diabetes and Hypertension.  Sonographer:    Stephanie Thornton Referring Phys: [id number] Stephanie Thornton  1. Left ventricular  ejection fraction, by estimation, is 50 to 55%. The left ventricle has low normal function. The left ventricle demonstrates regional wall motion abnormalities (see scoring diagram/findings for description). There is mild concentric left ventricular hypertrophy.  Indeterminate diastolic filling due to E-A fusion.  2. Right ventricular systolic function is normal. The right ventricular size is normal. There is mildly elevated pulmonary artery systolic pressure. The estimated right ventricular systolic pressure is 07.2 mmHg.  3. The mitral valve is grossly normal. Trivial mitral valve regurgitation. No evidence of mitral stenosis.  4. The aortic valve is tricuspid. Aortic valve regurgitation is not visualized. No aortic stenosis is present.  5. The inferior vena cava is dilated in size with <50% respiratory variability, suggesting right atrial pressure of 15 mmHg. FINDINGS  Left Ventricle: Left ventricular ejection fraction, by estimation, is 50 to 55%. The left ventricle has low normal function. The left ventricle demonstrates regional wall motion abnormalities. The left ventricular internal cavity size was normal in size. There is mild concentric left ventricular hypertrophy. Indeterminate diastolic filling due to E-A fusion.  LV Wall Scoring: The antero-lateral wall and posterior wall are hypokinetic. Right Ventricle: The right ventricular size is normal. No increase in right ventricular wall thickness. Right ventricular systolic function is normal. There is mildly elevated pulmonary artery systolic pressure. The tricuspid regurgitant velocity is 2.46  m/s, and with an assumed right atrial pressure of 15 mmHg, the estimated right ventricular systolic pressure is 25.7 mmHg. Left Atrium: Left atrial size was normal in size. Right Atrium: Right atrial size was normal in size. Pericardium: Trivial pericardial effusion is present. Presence of epicardial fat layer. Mitral Valve: The mitral valve is grossly normal. Trivial mitral valve regurgitation. No evidence of mitral valve stenosis. Tricuspid Valve: The tricuspid valve is grossly normal. Tricuspid valve regurgitation is mild . No evidence of tricuspid stenosis. Aortic Valve: The aortic valve is tricuspid. Aortic valve  regurgitation is not visualized. No aortic stenosis is present. Aortic valve peak gradient measures 4.5 mmHg. Pulmonic Valve: The pulmonic valve was grossly normal. Pulmonic valve regurgitation is not visualiz

## 2022-05-09 NOTE — TOC Transition Note (Addendum)
Transition of Care (TOC) - CM/SW Discharge Note ? ? ?Patient Details  ?Name: Stephanie Thornton ?MRN: [id number] ?Date of Birth: 07/13/1971 ? ?Transition of Care (TOC) CM/SW Contact:  ?Leone Haven, RN ?Phone Number: ?05/09/2022, 9:55 AM ? ? ?Clinical Narrative:    ?Patient is for dc today, she will be on brilinta, co pay is zero dollars. Her daughter will transport her home today. Cardiac rehab in the room seeing patient.  ? ? ?  ?  ? ? ?Patient Goals and CMS Choice ?  ?  ?  ? ?Discharge Placement ?  ?           ?  ?  ?  ?  ? ?Discharge Plan and Services ?  ?  ?           ?  ?  ?  ?  ?  ?  ?  ?  ?  ?  ? ?Social Determinants of Health (SDOH) Interventions ?  ? ? ?Readmission Risk Interventions ?   ? View : No data to display.  ?  ?  ?  ? ? ? ? ? ?

## 2022-05-09 NOTE — Progress Notes (Signed)
CARDIAC REHAB PHASE I  ? ?Brief MI/stent education completed with pts daughter. Pt very drowsy and difficult to arouse. Pts daughter educated on importance of ASA and Brilinta. Reviewed site care and restrictions. Will refer to CRP II [place]. RN at bedside. ? ?1031-1110 ?Reynold Bowen, RN BSN ?05/09/2022 ?11:18 AM ? ?

## 2022-05-09 NOTE — Discharge Summary (Signed)
?Discharge Summary  ?  ?Patient ID: Stephanie Thornton ?MRN: [id number]; DOB: 13-Jul-1971 ? ?Admit date: 05/06/2022 ?Discharge date: 05/09/2022 ? ?PCP:  Milford Cage, PA ?  ?[facility] Providers ?Cardiologist:  Fransico Him, MD   { ? ? ?Discharge Diagnoses  ?  ?Principal Problem: ?  NSTEMI (non-ST elevated myocardial infarction) ([place]) ?Active Problems: ?  DM (diabetes mellitus) ([place]) ?  Unstable angina (HCC) ?  Essential hypertension ?  Acute coronary syndrome (HCC) ? ? ? ?Diagnostic Studies/Procedures  ?  ?Left heart cath on 05/07/22: ? ?Total occlusion of the circumflex beyond the origin of OM1 takeoff and 99% stenosis in the proximal obtuse marginal with very high syntax due to acute angulation at origin of circumflex from left main and at origin of obtuse marginal from circumflex. ?99% stenosis reduced to 0% stenosis with TIMI grade III flow using a 24 x 3.0 Synergy postdilated to 3.5 atm. ?Widely patent left main, LAD, and right coronary, each with minimal luminal irregularity. ?Mid anterior wall akinesis.  Normal anterobasal and apical contractility and hypokinesis of the inferior wall.  EF 55 to 60%.  EDP 14 mmHg. ?  ?RECOMMENDATIONS: ?  ?She has had a completed infarct.  Possibly had recanalization of the totally occluded first obtuse marginal.  With LV hypokinesis and late presentation, there is some potential risk of mechanical complication such as free wall rupture. ?Beta-blocker therapy ?Blood pressure control ?High intensity statin therapy ?Aspirin and Brilinta x12 months ?Discontinue Aggrastat 2 hours after Brilinta load which was given in the Cath Lab. ?2D Doppler echocardiogram.  ?  ? ? ? ? ?Echo from 05/07/22: ? ? 1. Left ventricular ejection fraction, by estimation, is 50 to 55%. The  ?left ventricle has low normal function. The left ventricle demonstrates  ?regional wall motion abnormalities (see scoring diagram/findings for  ?description). There is mild concentric  ?left ventricular hypertrophy.  Indeterminate diastolic filling due to E-A  ?fusion.  ? 2. Right ventricular systolic function is normal. The right ventricular  ?size is normal. There is mildly elevated pulmonary artery systolic  ?pressure. The estimated right ventricular systolic pressure is 67.6 mmHg.  ? 3. The mitral valve is grossly normal. Trivial mitral valve  ?regurgitation. No evidence of mitral stenosis.  ? 4. The aortic valve is tricuspid. Aortic valve regurgitation is not  ?visualized. No aortic stenosis is present.  ? 5. The inferior vena cava is dilated in size with <50% respiratory  ?variability, suggesting right atrial pressure of 15 mmHg.  ? ? ?  ?History of Present Illness   ?  ?Per admission H&P on 05/06/22: ? ?Stephanie Thornton is a 51 y.o. female with non obstructive CAD, HTN, DM2, asthma, and OSA who presented with acute on chronic worsening chest pain. ? ?Ms. Butz reported that she had acute onset chest pain at 0900 (05/07/22) that was 5/10 severity, nagging, left-sided with radiation towards her left arm.  The severity of the pain increased to 10/10 and was fairly constant and picked up in intensity throughout the afternoon and evening.  She was brought in by [facility] and was given aspirin 324 mg in route. ?  ?VS on ED admission: P 87, BP 151/100, RR 15, O2 100%/RA, T 99.29F ?  ?VS during initial evaluation: P 91, BP 146/96 (111), RR 19, O2 98%/RA ?  ?During initial evaluation she was still experiencing 10/10 severity constant chest pain and was on low-dose nitroglycerin at 20 mcg/min.  She had also been given 3 SLNG  without much symptom relief.  Nitroglycerin was rapidly uptitrated eventually reaching max dose of 200 mcg/min.  She was still reporting 7-8/10 severity chest discomfort despite max dose nitroglycerin and additional narcotics were provided which significantly improved symptoms.  Repeat ECGs without any ischemic changes. Of note she is on chronic OxyContin for back/hip pain and OA.  ?  ?Risk factors for  obstructive coronary disease include DM2, active tobacco use (1 ppd), HTN and known prior non obstructive CAD.  Additionally her mother had MI and subsequent PCI at age 32 years old.  She denies cocaine or other illicit drug use. ? ? ?Hospital Course  ?   ?Consultants: N/A   ? ?NSTEMI ?CAD ?-Presented with with acute on chronic chest pain ?-High sensitive troponin 7600>24000 ?-EKG admission with sinus rhythm, no significant ischemic change ?-Underwent left heart catheterization on 05/07/2022 which revealed total occlusion of circumflex beyond the origin of OM1 takeoff and 99% stenosis in the proximal obtuse marginal with very high syntax due to acute angulation at origin of circumflex from left main and at origin of obtuse marginal from circumflex.  She was treated with DES.  Widely patent left main, LAD, RCA.  EF 55 to 60%, EDP 14 mmHg.  ?-Medical therapy: Aspirin 81 mg + Brilinta 90 mg twice daily for 67-month atorvastatin 80 mg daily; metoprolol 25 mg twice daily; continue home meds olmesartan+ amlodipine, stop HCTZ ; [facility] scripts all [facility]  ?-Post cath care and radial site care discussed in detail at bedside ?-Follow-up arranged 05/22/22 with cardiology  ? ?HTN ?-BP well controlled, resume home medication olmesartan and amlodipine, stop hydrochlorothiazide, started on metoprolol 25 mg twice daily this admission, monitor BP at home, bring logs to office for follow-up ? ?HLD ?- LDL 58, on Crestor 20 mg daily at home, started on atorvastatin 80 mg daily here/will continue ? ?Abnormal TSH ?- TSH low, FT4 WNL, repeat outpatient lab per PCP in 4 weeks ? ?Type 2 DM ?-A1c 9.4% ?-On Farxiga, NovoLog , Ozempic at home, has Omnipod , will not adjust antidiabetic regimen, advised patient to follow-up with endocrinology, needs tight control of diabetes ? ?OSA  ?Asthma  ?Depression ?IBS ?Chronic pain  ?Peripheral neuropathy  ?-No change of patient's chronic home medication, please follow-up with PCP ? ? ?Did  the patient have an acute coronary syndrome (MI, NSTEMI, STEMI, etc) this admission?:  Yes                              ? ?AHA/ACC Clinical Performance & Quality Measures: ?Aspirin prescribed? - Yes ?ADP Receptor Inhibitor (Plavix/Clopidogrel, Brilinta/Ticagrelor or Effient/Prasugrel) prescribed (includes medically managed patients)? - Yes ?Beta Blocker prescribed? - Yes ?High Intensity Statin (Lipitor 40-842mor Crestor 20-407mprescribed? - Yes ?EF assessed during THIS hospitalization? - Yes ?For EF <40%, was ACEI/ARB prescribed? - Yes ?For EF <40%, Aldosterone Antagonist (Spironolactone or Eplerenone) prescribed? - Not Applicable (EF >/= 40%02%Cardiac Rehab Phase II ordered (including medically managed patients)? - Yes  ? ?   ? ?  ?_____________ ? ?Discharge Vitals ?Blood pressure 120/76, pulse 69, temperature 98.3 ?F (36.8 ?C), temperature source Oral, resp. rate 18, height _0  (1.575 m), weight 52.1 kg, SpO2 97 %.  ?Filed Weights  ? 05/06/22 2027 05/08/22 0500 05/09/22 0159  ?Weight: 62.6 kg 52.6 kg 52.1 kg  ? ? ?Labs & Radiologic Studies  ?  ?CBC ?Recent Labs  ?  05/08/22 ?0056378/11/23 ?0409  ?  WBC 9.8 8.3  ?HGB 11.1* 11.7*  ?HCT 33.3* 35.2*  ?MCV 84.1 83.0  ?PLT 181 196  ? ?Basic Metabolic Panel ?Recent Labs  ?  05/07/22 ?1164 05/08/22 ?0302 05/09/22 ?0409  ?NA  --  133* 137  ?K 3.6 4.1 3.9  ?CL  --  108 112*  ?CO2  --  19* 19*  ?GLUCOSE  --  279* 267*  ?BUN  --  8 12  ?CREATININE  --  0.61 0.74  ?CALCIUM  --  8.3* 8.8*  ?MG 1.7  --   --   ? ?Liver Function Tests ?No results for input(s): AST, ALT, ALKPHOS, BILITOT, PROT, ALBUMIN in the last 72 hours. ?No results for input(s): LIPASE, AMYLASE in the last 72 hours. ?High Sensitivity Troponin:   ?Recent Labs  ?Lab 05/06/22 ?2019 05/06/22 ?2238  ?TROPONINIHS 7,600* >24,000*  ?  ?BNP ?Invalid input(s): POCBNP ?D-Dimer ?No results for input(s): DDIMER in the last 72 hours. ?Hemoglobin A1C ?Recent Labs  ?  05/07/22 ?0223  ?HGBA1C 9.4*  ? ?Fasting Lipid  Panel ?Recent Labs  ?  05/07/22 ?0223  ?CHOL 111  ?HDL 41  ?[place] 58  ?TRIG 60  ?CHOLHDL 2.7  ? ?Thyroid Function Tests ?Recent Labs  ?  05/07/22 ?0223 05/07/22 ?0734  ?TSH 0.190*  --   ?T3FREE  --  2.7  ?

## 2022-05-11 ENCOUNTER — Other Ambulatory Visit: Payer: Self-pay

## 2022-05-11 ENCOUNTER — Encounter (HOSPITAL_COMMUNITY): Payer: Self-pay | Admitting: Emergency Medicine

## 2022-05-11 ENCOUNTER — Emergency Department (HOSPITAL_COMMUNITY): Payer: Medicare HMO

## 2022-05-11 ENCOUNTER — Emergency Department (HOSPITAL_COMMUNITY)
Admission: EM | Admit: 2022-05-11 | Discharge: 2022-05-11 | Disposition: A | Discharge status: Home / Self Care | Payer: Medicare HMO | Attending: Emergency Medicine | Admitting: Emergency Medicine

## 2022-05-11 DIAGNOSIS — Z7984 Long term (current) use of oral hypoglycemic drugs: Secondary | ICD-10-CM | POA: Insufficient documentation

## 2022-05-11 DIAGNOSIS — Z79899 Other long term (current) drug therapy: Secondary | ICD-10-CM | POA: Insufficient documentation

## 2022-05-11 DIAGNOSIS — R531 Weakness: Secondary | ICD-10-CM | POA: Diagnosis not present

## 2022-05-11 DIAGNOSIS — Z7982 Long term (current) use of aspirin: Secondary | ICD-10-CM | POA: Insufficient documentation

## 2022-05-11 DIAGNOSIS — R6882 Decreased libido: Secondary | ICD-10-CM | POA: Insufficient documentation

## 2022-05-11 DIAGNOSIS — R4182 Altered mental status, unspecified: Secondary | ICD-10-CM | POA: Diagnosis present

## 2022-05-11 DIAGNOSIS — G9340 Encephalopathy, unspecified: Secondary | ICD-10-CM | POA: Insufficient documentation

## 2022-05-11 DIAGNOSIS — E119 Type 2 diabetes mellitus without complications: Secondary | ICD-10-CM | POA: Diagnosis not present

## 2022-05-11 DIAGNOSIS — E876 Hypokalemia: Secondary | ICD-10-CM | POA: Diagnosis not present

## 2022-05-11 DIAGNOSIS — G9341 Metabolic encephalopathy: Secondary | ICD-10-CM

## 2022-05-11 DIAGNOSIS — I639 Cerebral infarction, unspecified: Secondary | ICD-10-CM | POA: Diagnosis present

## 2022-05-11 LAB — CBC
HCT: 37.6 % (ref 36.0–46.0)
Hemoglobin: 11.8 g/dL — ABNORMAL LOW (ref 12.0–15.0)
MCH: 27.3 pg (ref 26.0–34.0)
MCHC: 31.4 g/dL (ref 30.0–36.0)
MCV: 87 fL (ref 80.0–100.0)
Platelets: 252 10*3/uL (ref 150–400)
RBC: 4.32 MIL/uL (ref 3.87–5.11)
RDW: 14.6 % (ref 11.5–15.5)
WBC: 9.7 10*3/uL (ref 4.0–10.5)
nRBC: 0 % (ref 0.0–0.2)

## 2022-05-11 LAB — COMPREHENSIVE METABOLIC PANEL
ALT: 22 U/L (ref 0–44)
AST: 18 U/L (ref 15–41)
Albumin: 3.1 g/dL — ABNORMAL LOW (ref 3.5–5.0)
Alkaline Phosphatase: 95 U/L (ref 38–126)
Anion gap: 9 (ref 5–15)
BUN: 10 mg/dL (ref 6–20)
CO2: 19 mmol/L — ABNORMAL LOW (ref 22–32)
Calcium: 9 mg/dL (ref 8.9–10.3)
Chloride: 111 mmol/L (ref 98–111)
Creatinine, Ser: 0.98 mg/dL (ref 0.44–1.00)
GFR, Estimated: >60 mL/min (ref >60)
Glucose, Bld: 162 mg/dL — ABNORMAL HIGH (ref 70–99)
Potassium: 3.4 mmol/L — ABNORMAL LOW (ref 3.5–5.1)
Sodium: 139 mmol/L (ref 135–145)
Total Bilirubin: 0.5 mg/dL (ref 0.3–1.2)
Total Protein: 6.2 g/dL — ABNORMAL LOW (ref 6.5–8.1)

## 2022-05-11 LAB — APTT: aPTT: 31 seconds (ref 24–36)

## 2022-05-11 LAB — I-STAT ARTERIAL BLOOD GAS, ED
Acid-base deficit: 3 mmol/L — ABNORMAL HIGH (ref 0.0–2.0)
Bicarbonate: 20.7 mmol/L (ref 20.0–28.0)
Calcium, Ion: 1.33 mmol/L (ref 1.15–1.40)
HCT: 35 % — ABNORMAL LOW (ref 36.0–46.0)
Hemoglobin: 11.9 g/dL — ABNORMAL LOW (ref 12.0–15.0)
O2 Saturation: 97 %
Patient temperature: 97.6
Potassium: 3.2 mmol/L — ABNORMAL LOW (ref 3.5–5.1)
Sodium: 142 mmol/L (ref 135–145)
TCO2: 22 mmol/L (ref 22–32)
pCO2 arterial: 32.1 mmHg (ref 32–48)
pH, Arterial: 7.415 (ref 7.35–7.45)
pO2, Arterial: 82 mmHg — ABNORMAL LOW (ref 83–108)

## 2022-05-11 LAB — URINALYSIS, ROUTINE W REFLEX MICROSCOPIC
Bilirubin Urine: NEGATIVE
Glucose, UA: NEGATIVE mg/dL
Hgb urine dipstick: NEGATIVE
Ketones, ur: NEGATIVE mg/dL
Nitrite: NEGATIVE
Protein, ur: NEGATIVE mg/dL
Specific Gravity, Urine: 1.01 (ref 1.005–1.030)
pH: 5 (ref 5.0–8.0)

## 2022-05-11 LAB — DIFFERENTIAL
Abs Immature Granulocytes: 0.05 10*3/uL (ref 0.00–0.07)
Basophils Absolute: 0 10*3/uL (ref 0.0–0.1)
Basophils Relative: 0 %
Eosinophils Absolute: 0.4 10*3/uL (ref 0.0–0.5)
Eosinophils Relative: 4 %
Immature Granulocytes: 1 %
Lymphocytes Relative: 35 %
Lymphs Abs: 3.4 10*3/uL (ref 0.7–4.0)
Monocytes Absolute: 0.6 10*3/uL (ref 0.1–1.0)
Monocytes Relative: 6 %
Neutro Abs: 5.2 10*3/uL (ref 1.7–7.7)
Neutrophils Relative %: 54 %

## 2022-05-11 LAB — I-STAT CHEM 8, ED
BUN: 11 mg/dL (ref 6–20)
Calcium, Ion: 1.15 mmol/L (ref 1.15–1.40)
Chloride: 110 mmol/L (ref 98–111)
Creatinine, Ser: 0.8 mg/dL (ref 0.44–1.00)
Glucose, Bld: 162 mg/dL — ABNORMAL HIGH (ref 70–99)
HCT: 37 % (ref 36.0–46.0)
Hemoglobin: 12.6 g/dL (ref 12.0–15.0)
Potassium: 3.4 mmol/L — ABNORMAL LOW (ref 3.5–5.1)
Sodium: 142 mmol/L (ref 135–145)
TCO2: 20 mmol/L — ABNORMAL LOW (ref 22–32)

## 2022-05-11 LAB — SALICYLATE LEVEL: Salicylate Lvl: <7 mg/dL — ABNORMAL LOW (ref 7.0–30.0)

## 2022-05-11 LAB — I-STAT BETA HCG BLOOD, ED (MC, WL, AP ONLY): I-stat hCG, quantitative: <5 m[IU]/mL (ref <5)

## 2022-05-11 LAB — PROTIME-INR
INR: 1.2 (ref 0.8–1.2)
Prothrombin Time: 14.6 seconds (ref 11.4–15.2)

## 2022-05-11 LAB — CBG MONITORING, ED: Glucose-Capillary: 152 mg/dL — ABNORMAL HIGH (ref 70–99)

## 2022-05-11 LAB — ACETAMINOPHEN LEVEL: Acetaminophen (Tylenol), Serum: <10 ug/mL — ABNORMAL LOW (ref 10–30)

## 2022-05-11 MED ORDER — SODIUM CHLORIDE 0.9% FLUSH
3.0000 mL | Freq: Once | INTRAVENOUS | Status: AC
  Administered 2022-05-11: 3 mL via INTRAVENOUS

## 2022-05-11 NOTE — ED Provider Notes (Signed)
?[facility] EMERGENCY DEPARTMENT ?Provider Note ? ? ?CSN: [id number] ?Arrival date & time: 05/11/22  1555 ? ?An emergency department physician performed an initial assessment on this suspected stroke patient at 1557. ? ?History ? ?Chief Complaint  ?Patient presents with  ? Code Stroke  ? ? ?Stephanie Thornton is a 51 y.o. female. ? ?Patient presents as code stroke with EMS when family found her sleepy/unable to arouse.  Unable to get details from patient on arrival.  Neuro stroke team also assisting on arrival.  Patient was admitted for NSTEMI and discharged on May 11.  EMS had normal glucose on route. ? ? ?  ? ?Home Medications ?Prior to Admission medications   ?Medication Sig Start Date End Date Taking? Authorizing Provider  ?albuterol (PROVENTIL HFA;VENTOLIN HFA) 108 (90 Base) MCG/ACT inhaler Inhale 1-2 puffs into the lungs every 6 (six) hours as needed for wheezing or shortness of breath. ?Patient taking differently: Inhale 2 puffs into the lungs every 6 (six) hours as needed for wheezing or shortness of breath. 03/04/19   Grayce Sessions, NP  ?amitriptyline (ELAVIL) 150 MG tablet Take 150 mg by mouth at bedtime. 01/28/19   [provider]  ?amLODipine (NORVASC) 10 MG tablet Take 1 tablet (10 mg total) by mouth daily. 05/09/22   Cyndi Bender, NP  ?aspirin 81 MG chewable tablet Chew 1 tablet (81 mg total) by mouth daily. 05/09/22   Cyndi Bender, NP  ?atorvastatin (LIPITOR) 80 MG tablet Take 1 tablet (80 mg total) by mouth daily. 05/09/22   Cyndi Bender, NP  ?baclofen (LIORESAL) 10 MG tablet Take 10 mg by mouth 3 (three) times daily. 05/05/22   [provider]  ?busPIRone (BUSPAR) 10 MG tablet Take 10 mg by mouth 3 (three) times daily. 01/25/19   [provider]  ?FARXIGA 10 MG TABS tablet Take 10 mg by mouth daily. 09/19/20   [provider]  ?gabapentin (NEURONTIN) 300 MG capsule Take 1 capsule (300 mg total) by mouth 3 (three) times daily. 06/29/19   Grayce Sessions,  NP  ?LATUDA 80 MG TABS tablet Take 80 mg by mouth every evening. 02/01/19   [provider]  ?metoprolol tartrate (LOPRESSOR) 25 MG tablet Take 1 tablet (25 mg total) by mouth 2 (two) times daily. 05/09/22   Cyndi Bender, NP  ?nitroGLYCERIN (NITROSTAT) 0.4 MG SL tablet Place 1 tablet (0.4 mg total) under the tongue every 5 (five) minutes x 3 doses as needed for chest pain. 05/09/22   Cyndi Bender, NP  ?NOVOLOG 100 UNIT/ML injection Inject 1 Units into the skin in the morning, at noon, in the evening, and at bedtime. 11/21/20   [provider]  ?olmesartan (BENICAR) 40 MG tablet Take 1 tablet (40 mg total) by mouth daily. 05/09/22 05/09/23  Cyndi Bender, NP  ?oxyCODONE (ROXICODONE) 15 MG immediate release tablet Take 15 mg by mouth 4 (four) times daily as needed for pain. 02/20/19   [provider]  ?OZEMPIC, 1 MG/DOSE, 4 MG/3ML SOPN Inject 1 mg as directed every Sunday. 12/14/20   [provider]  ?ticagrelor (BRILINTA) 90 MG TABS tablet Take 1 tablet (90 mg total) by mouth 2 (two) times daily. 05/09/22   Cyndi Bender, NP  ?topiramate (TOPAMAX) 200 MG tablet Take 200 mg by mouth at bedtime. 01/28/19   [provider]  ?traZODone (DESYREL) 100 MG tablet Take 150 mg by mouth at bedtime. 02/24/19   [provider]  ?   ? ?Allergies    ?  Lyrica [pregabalin], Rocephin [ceftriaxone sodium in dextrose], Sulfa antibiotics, Ultram [tramadol], Vancomycin, Glucotrol [glipizide], Keflex [cephalexin], Penicillins, and Janumet [sitagliptin-metformin hcl]   ? ?Review of Systems   ?Review of Systems  ?Unable to perform ROS: Mental status change  ? ?Physical Exam ?Updated Vital Signs ?BP (!) 130/92   Pulse (!) 59   Temp 97.6 ?F (36.4 ?C) (Oral)   Resp 19   SpO2 97%  ?Physical Exam ?Vitals and nursing note reviewed.  ?Constitutional:   ?   General: She is not in acute distress. ?   Appearance: She is well-developed.  ?HENT:  ?   Head: Normocephalic and atraumatic.  ?   Mouth/Throat:  ?   Mouth:  Mucous membranes are moist.  ?Eyes:  ?   General:     ?   Right eye: No discharge.     ?   Left eye: No discharge.  ?   Conjunctiva/sclera: Conjunctivae normal.  ?Neck:  ?   Trachea: No tracheal deviation.  ?Cardiovascular:  ?   Rate and Rhythm: Normal rate.  ?   Heart sounds: No murmur heard. ?Pulmonary:  ?   Effort: Pulmonary effort is normal.  ?Abdominal:  ?   General: There is no distension.  ?   Palpations: Abdomen is soft.  ?   Tenderness: There is no abdominal tenderness. There is no guarding.  ?Musculoskeletal:  ?   Cervical back: Normal range of motion and neck supple. No rigidity.  ?Skin: ?   General: Skin is warm.  ?   Capillary Refill: Capillary refill takes less than 2 seconds.  ?   Findings: No rash.  ?Neurological:  ?   Mental Status: She is alert.  ?   Cranial Nerves: No cranial nerve deficit.  ?   Comments: Patient has general weakness, barely can hold arms and legs against gravity but equal bilateral.  Few mumbling speech responses nothing discernible.  Patient confused.  Pupils equal bilateral.  ?Psychiatric:     ?   Behavior: Behavior is slowed.  ?   Comments: Confused  ? ? ?ED Results / Procedures / Treatments   ?Labs ?(all labs ordered are listed, but only abnormal results are displayed) ?Labs Reviewed  ?CBC - Abnormal; Notable for the following components:  ?    Result Value  ? Hemoglobin 11.8 (*)   ? All other components within normal limits  ?COMPREHENSIVE METABOLIC PANEL - Abnormal; Notable for the following components:  ? Potassium 3.4 (*)   ? CO2 19 (*)   ? Glucose, Bld 162 (*)   ? Total Protein 6.2 (*)   ? Albumin 3.1 (*)   ? All other components within normal limits  ?URINALYSIS, ROUTINE W REFLEX MICROSCOPIC - Abnormal; Notable for the following components:  ? APPearance HAZY (*)   ? Leukocytes,Ua SMALL (*)   ? Bacteria, UA RARE (*)   ? All other components within normal limits  ?SALICYLATE LEVEL - Abnormal; Notable for the following components:  ? Salicylate Lvl <7.0 (*)   ? All other  components within normal limits  ?ACETAMINOPHEN LEVEL - Abnormal; Notable for the following components:  ? Acetaminophen (Tylenol), Serum <10 (*)   ? All other components within normal limits  ?I-STAT CHEM 8, ED - Abnormal; Notable for the following components:  ? Potassium 3.4 (*)   ? Glucose, Bld 162 (*)   ? TCO2 20 (*)   ? All other components within normal limits  ?CBG MONITORING, ED - Abnormal; Notable for the  following components:  ? Glucose-Capillary 152 (*)   ? All other components within normal limits  ?I-STAT ARTERIAL BLOOD GAS, ED - Abnormal; Notable for the following components:  ? pO2, Arterial 82 (*)   ? Acid-base deficit 3.0 (*)   ? Potassium 3.2 (*)   ? HCT 35.0 (*)   ? Hemoglobin 11.9 (*)   ? All other components within normal limits  ?PROTIME-INR  ?APTT  ?DIFFERENTIAL  ?RAPID URINE DRUG SCREEN, HOSP PERFORMED  ?I-STAT BETA HCG BLOOD, ED ([facility], [facility], [facility] ONLY)  ?I-STAT VENOUS BLOOD GAS, ED  ? ? ?EKG ?EKG Interpretation ? ?Date/Time:  Saturday May 11 2022 16:28:02 EDT ?Ventricular Rate:  60 ?PR Interval:  170 ?QRS Duration: 96 ?QT Interval:  442 ?QTC Calculation: 442 ?R Axis:   21 ?Text Interpretation: Sinus rhythm Nonspecific T abnormalities, lateral leads Confirmed by Blane Ohara [phone]) on 05/11/2022 5:30:34 PM ? ?Radiology ?CT HEAD CODE STROKE WO CONTRAST ? ?Result Date: 05/11/2022 ?CLINICAL DATA:  Code stroke. Neuro deficit, acute, stroke suspected. Drowsy. EXAM: CT HEAD WITHOUT CONTRAST TECHNIQUE: Contiguous axial images were obtained from the base of the skull through the vertex without intravenous contrast. RADIATION DOSE REDUCTION: This exam was performed according to the departmental dose-optimization program which includes automated exposure control, adjustment of the mA and/or kV according to patient size and/or use of iterative reconstruction technique. COMPARISON:  None Available. FINDINGS: Brain: A small left parietal cortical infarct as a late subacute to chronic appearance. No definite  acute infarct, intracranial hemorrhage, midline shift, or extra-axial fluid collection is identified. The ventricles are normal in size. The pituitary gland is mildly enlarged with a nodular appearance of t

## 2022-05-11 NOTE — Consult Note (Addendum)
Addendum 05/11/22 1719:  Labs unimpressive. UDS still pending. Exam continuing to improve per RN but still not back to mental status baseline. If she improves to baseline in ED, no further stroke workup indicated. If she does not improve and is admitted for observation, MRI brain wo contrast and rEEG would be reasonable as part of that workup but given her lack of focality stroke is unlikely culprit. If ends up requiring admission then we will follow up with her in AM. Continue ASA and brilinta prescribed by cardiology.  Bing Neighbors, MD [facility] [phone]  If 7pm- 7am, please page neurology on call as listed in AMION.   NEUROLOGY CONSULTATION NOTE   Date of service: May 11, 2022 Patient Name: Stephanie Thornton MRN:  [id number] DOB:  05-31-1971 Reason for consult: stroke code for encephalopathy Requesting physician: Dr. Blane Ohara _ _ _   _ __   _ __ _ _  __ __   _ __   __ _  History of Present Illness    This is a 51 year old female with a past medical history significant for hypertension, diabetes, OSA on CPAP, bipolar disorder, recent admission for NSTEMI status post PCI and stent placement on aspirin and Brilinta who is brought in by EMS today for altered mental status.  Last seen well at 12:00 PM today per family at which point she was ambulating in her walker.  A close friend went to visit at approximately 3:15 PM and she was asleep in her bed and friend was unable to arouse her.  Friend states that she was breathing very abnormally was having prolonged periods up to 30 seconds of not breathing at all.  Patient was pretty activated as a stroke code in route with EMS.  Stroke scale was 6 but exam was nonfocal (1 for drift in every extremity, no aphasia) and patient seemed encephalopathic but was rapidly improving.  Of note she did get Narcan x2 in route with EMS without immediate improvement.  CT head showed no acute intracranial process, remote left parietal infarct  (personal review).  TNK was not administered due to nonfocal symptoms and low suspicion for stroke.  CTA was not performed due to exam and consistent with LVO.    ROS   UTA 2/2 encephalopathy  Past History   I have reviewed the following:  Past Medical History:  Diagnosis Date   Anxiety    Arthritis    "knees, feet, back" (05/24/2015)   Benign essential HTN 05/01/2015   Bipolar disorder (HCC) diagnosed early 90s   Chronic bronchitis (HCC)    "get it q yr"   Chronic lower back pain    Depression    Diabetes mellitus type II 2010   GERD (gastroesophageal reflux disease)    Headache    "maybe 3 times/wk" (05/24/2015)   History of hiatal hernia    History of stomach ulcers    Menopause    Migraine    "1-2/wk" (05/24/2015)   OSA on CPAP    Past Surgical History:  Procedure Laterality Date   ABDOMINAL HYSTERECTOMY  2007   BUNIONECTOMY Bilateral    CARPAL TUNNEL RELEASE Right    CORONARY STENT INTERVENTION N/A 05/07/2022   Procedure: CORONARY STENT INTERVENTION;  Surgeon: Lyn Records, MD;  Location: [facility] INVASIVE CV LAB;  Service: Cardiovascular;  Laterality: N/A;   HAMMER TOE SURGERY Left    LEFT HEART CATH AND CORONARY ANGIOGRAPHY N/A 05/20/2018   Procedure: LEFT HEART CATH AND CORONARY ANGIOGRAPHY;  Surgeon: Marykay Lex, MD;  Location: [facility] INVASIVE CV LAB;  Service: Cardiovascular;  Laterality: N/A;   LEFT HEART CATH AND CORONARY ANGIOGRAPHY N/A 05/07/2022   Procedure: LEFT HEART CATH AND CORONARY ANGIOGRAPHY;  Surgeon: Lyn Records, MD;  Location: [facility] INVASIVE CV LAB;  Service: Cardiovascular;  Laterality: N/A;   TONSILLECTOMY Bilateral 05/24/2015   Procedure: TONSILLECTOMY;  Surgeon: Christia Reading, MD;  Location: [facility] OR;  Service: ENT;  Laterality: Bilateral;   Family History  Problem Relation Age of Onset   Bipolar disorder Mother    CVA Mother    Bipolar disorder Son    Depression Brother        schizoaffective d/o   Hypertension Sister    Heart disease Maternal Aunt     Heart attack Maternal Aunt    Hypertension Brother    Social History   Socioeconomic History   Marital status: Divorced    Spouse name: Not on file   Number of children: 3   Years of education: Not on file   Highest education level: Not on file  Occupational History   Not on file  Tobacco Use   Smoking status: Some Days    Packs/day: 1.00    Years: 30.00    Pack years: 30.00    Types: Cigarettes   Smokeless tobacco: Never  Vaping Use   Vaping Use: Never used  Substance and Sexual Activity   Alcohol use: No   Drug use: No   Sexual activity: Yes  Other Topics Concern   Not on file  Social History Narrative   Not on file   Social Determinants of Health   Financial Resource Strain: Not on file  Food Insecurity: Not on file  Transportation Needs: Not on file  Physical Activity: Not on file  Stress: Not on file  Social Connections: Not on file   Allergies  Allergen Reactions   Lyrica [Pregabalin] Shortness Of Breath and Rash   Rocephin [Ceftriaxone Sodium In Dextrose] Shortness Of Breath   Sulfa Antibiotics Hives and Shortness Of Breath   Ultram [Tramadol] Shortness Of Breath    Had asthma attack   Vancomycin Anaphylaxis    Per Dr. Flonnie Overman   Glucotrol [Glipizide] Hives   Keflex [Cephalexin] Hives   Penicillins Hives    Has patient had a PCN reaction causing immediate rash, facial/tongue/throat swelling, SOB or lightheadedness with hypotension: YES Has patient had a PCN reaction causing severe rash involving mucus membranes or skin necrosis: NO Has patient had a PCN reaction that required hospitalization: YES Has patient had a PCN reaction occurring within the last 10 years: YES If all of the above answers are "NO", then may proceed with Cephalosporin use.   Janumet [Sitagliptin-Metformin Hcl] Nausea Only    Medications   (Not in a hospital admission)     Current Facility-Administered Medications:    sodium chloride flush (NS) 0.9 % injection 3 mL, 3 mL,  Intravenous, Once, Blane Ohara, MD  Current Outpatient Medications:    albuterol (PROVENTIL HFA;VENTOLIN HFA) 108 (90 Base) MCG/ACT inhaler, Inhale 1-2 puffs into the lungs every 6 (six) hours as needed for wheezing or shortness of breath. (Patient taking differently: Inhale 2 puffs into the lungs every 6 (six) hours as needed for wheezing or shortness of breath.), Disp: 1 Inhaler, Rfl: 2   amitriptyline (ELAVIL) 150 MG tablet, Take 150 mg by mouth at bedtime., Disp: , Rfl:    amLODipine (NORVASC) 10 MG tablet, Take 1 tablet (10 mg total) by  mouth daily., Disp: 30 tablet, Rfl: 3   aspirin 81 MG chewable tablet, Chew 1 tablet (81 mg total) by mouth daily., Disp: 30 tablet, Rfl: 3   atorvastatin (LIPITOR) 80 MG tablet, Take 1 tablet (80 mg total) by mouth daily., Disp: 30 tablet, Rfl: 3   baclofen (LIORESAL) 10 MG tablet, Take 10 mg by mouth 3 (three) times daily., Disp: , Rfl:    busPIRone (BUSPAR) 10 MG tablet, Take 10 mg by mouth 3 (three) times daily., Disp: , Rfl:    FARXIGA 10 MG TABS tablet, Take 10 mg by mouth daily., Disp: , Rfl:    gabapentin (NEURONTIN) 300 MG capsule, Take 1 capsule (300 mg total) by mouth 3 (three) times daily., Disp: 270 capsule, Rfl: 0   LATUDA 80 MG TABS tablet, Take 80 mg by mouth every evening., Disp: , Rfl:    metoprolol tartrate (LOPRESSOR) 25 MG tablet, Take 1 tablet (25 mg total) by mouth 2 (two) times daily., Disp: 60 tablet, Rfl: 3   nitroGLYCERIN (NITROSTAT) 0.4 MG SL tablet, Place 1 tablet (0.4 mg total) under the tongue every 5 (five) minutes x 3 doses as needed for chest pain., Disp: 25 tablet, Rfl: 2   NOVOLOG 100 UNIT/ML injection, Inject 1 Units into the skin in the morning, at noon, in the evening, and at bedtime., Disp: , Rfl:    olmesartan (BENICAR) 40 MG tablet, Take 1 tablet (40 mg total) by mouth daily., Disp: 30 tablet, Rfl: 3   oxyCODONE (ROXICODONE) 15 MG immediate release tablet, Take 15 mg by mouth 4 (four) times daily as needed for pain.,  Disp: , Rfl:    OZEMPIC, 1 MG/DOSE, 4 MG/3ML SOPN, Inject 1 mg as directed every Sunday., Disp: , Rfl:    ticagrelor (BRILINTA) 90 MG TABS tablet, Take 1 tablet (90 mg total) by mouth 2 (two) times daily., Disp: 60 tablet, Rfl: 3   topiramate (TOPAMAX) 200 MG tablet, Take 200 mg by mouth at bedtime., Disp: , Rfl:    traZODone (DESYREL) 100 MG tablet, Take 150 mg by mouth at bedtime., Disp: , Rfl:   Vitals   Vitals:   05/11/22 1617  BP: 123/78  Pulse: 60  Resp: 18  Temp: 97.6 F (36.4 C)  TempSrc: Oral  SpO2: 100%     There is no height or weight on file to calculate BMI.  Physical Exam   Physical Exam Gen: drowsy, oriented to self, hospital, month, year, and age, but intermittent difficulty following simple commands Resp: normal WOB CV: RRR  Neuro: *MS: drowsy, oriented to self, hospital, month, year, and age, but intermittent difficulty following simple commands *Speech: mild dysarthria, edentulous, able to name and repeat *CN: PERRL, blinks to threat bilat, EOMI, sensation intact, face symmetric at rest, hearing intact to voice *Motor: drift but not to bed x4 extremities, nonfocal *Sensory: SILT *Reflexes: 1+ symm throughout toes mute bilat *Coordination, gait: UTA  NIHSS  1a Level of Conscious.: 1 1b LOC Questions: 0 1c LOC Commands: 0 2 Best Gaze: 0 3 Visual: 0 4 Facial Palsy: 0 5a Motor Arm - left: 1 5b Motor Arm - Right: 1 6a Motor Leg - Left: 1 6b Motor Leg - Right: 1 7 Limb Ataxia: 0 8 Sensory: 0 9 Best Language: 0 10 Dysarthria: 1 11 Extinct. and Inatten.: 0  TOTAL: 6   Premorbid mRS = 3 (walks with walker @ baseline)   Labs   CBC:  Recent Labs  Lab 05/08/22 0056 05/09/22 0409  WBC 9.8 8.3  HGB 11.1* 11.7*  HCT 33.3* 35.2*  MCV 84.1 83.0  PLT 181 196    Basic Metabolic Panel:  Lab Results  Component Value Date   NA 137 05/09/2022   K 3.9 05/09/2022   CO2 19 (L) 05/09/2022   GLUCOSE 267 (H) 05/09/2022   BUN 12 05/09/2022    CREATININE 0.74 05/09/2022   CALCIUM 8.8 (L) 05/09/2022   GFRNONAA >60 05/09/2022   GFRAA 104 12/15/2018   Lipid Panel:  Lab Results  Component Value Date   LDLCALC 58 05/07/2022   HgbA1c:  Lab Results  Component Value Date   HGBA1C 9.4 (H) 05/07/2022    Impression   This is a 51 year old female with a past medical history significant for hypertension, diabetes, OSA on CPAP, bipolar disorder, recent admission for NSTEMI status post PCI and stent placement on aspirin and Brilinta who is brought in by EMS today for altered mental status.  Exam notable for drowsiness, encephalopathy, mild diffuse weakness, and mild slurred speech (NIHSS = 6) but is nonfocal and overall low suspicion for stroke. Head CT NAICP, remote L parietal infarct. TNK was not administered due to nonfocal symptoms and low suspicion for stroke.  CTA was not performed due to exam and consistent with LVO.  Recommendations   - Encephalopathy (metabolic/infectious) workup in ED - ABG, CMP, CBC, UDS, other metabolic/infectious workup per ED. If etiology for encephalopathy is found, she does not need further stroke workup.   Will provide further guidance based on pending lab results. ______________________________________________________________________   Thank you for the opportunity to take part in the care of this patient. If you have any further questions, please contact the neurology consultation attending.  Signed,  Bing Neighbors, MD [facility] [phone]  If 7pm- 7am, please page neurology on call as listed in AMION.

## 2022-05-11 NOTE — Discharge Instructions (Addendum)
MRI brain for details of pituitary gland is needed to be scheduled your primary doctor. ?Do not take your narcotic pain medication, trazodone or baclofen until you see your doctor as these can make you sleepy again. ?Return for new concerns. ?

## 2022-05-11 NOTE — ED Triage Notes (Signed)
Pt BIB [facility] from home, at 1200 pt was ambulatory with walker per baseline, 1520 EMS called as family tried waking pt up and could not. Pt was unresponsive, only woke up to strong sternal rub from EMS. Given 2 x1mg  narcan IV by EMS with little to no response. Pupils never pinpoint. Per papers, pt has an oxy rx but was not found at the house. EMS VS- 120/70, CBG 163, 12 lead unremarkable.  ?

## 2022-05-11 NOTE — Code Documentation (Signed)
Stroke Response Nurse Documentation ?Code Documentation ? ?Stephanie Thornton is a 51 y.o. female arriving to [place]  via [facility] on 05/11/2022 with past medical hx of CAD with Recent NSTEMI, HTN, HLD, DM2, OSA, Chronic Pain. On aspirin 81 mg daily and Brilinta (ticagrelor) 90 mg bid. Code stroke was activated by EMS.  ? ?Patient from home where she was LKW at 1200 and now complaining of Altered Mental Status . Around 1200 patient walked with walker to bed. Daughter came to wake patient around 44 and was unable to wake her up. Pt had a recent NSTEMI for which she was discharged 2 days ago ? ?Stroke team at the bedside on patient arrival. Labs drawn and patient cleared for CT by Dr. Reather Thornton. Patient to CT with team. NIHSS 6, see documentation for details and code stroke times. Patient with decreased LOC, bilateral arm weakness, bilateral leg weakness, and dysarthria  on exam. The following imaging was completed:  CT Head. Patient is not a candidate for IV Thrombolytic due to negative CT head imaging. Patient is not a candidate for IR due to LVO negative.  ? ?Care Plan: Q2H neuro checks with pupil assessment.  ? ?Bedside handoff with ED RN Stephanie Cogan, RN.   ? ?Stephanie Thornton  ?Stroke Response RN ?  ?

## 2022-05-11 NOTE — ED Notes (Signed)
Pt ambulatory with 1-person assist from this RN without incident. Pt ambulatory with slow, steady gait, pt states unchanged from baseline. Pt denies complaints while ambulating, endorses shortness of breath after ambulation, which pt states has been her normal since cardiac catheterization.  ?

## 2022-05-11 NOTE — ED Notes (Signed)
Discharge instructions reviewed with patient. Patient verbalized understanding of instructions. Follow-up care and medications were reviewed. Patient ambulatory with steady gait. VSS upon discharge.  ?

## 2022-05-21 ENCOUNTER — Telehealth (HOSPITAL_COMMUNITY): Payer: Self-pay

## 2022-05-21 DIAGNOSIS — E785 Hyperlipidemia, unspecified: Secondary | ICD-10-CM | POA: Insufficient documentation

## 2022-05-21 DIAGNOSIS — E782 Mixed hyperlipidemia: Secondary | ICD-10-CM | POA: Insufficient documentation

## 2022-05-21 DIAGNOSIS — E1169 Type 2 diabetes mellitus with other specified complication: Secondary | ICD-10-CM | POA: Insufficient documentation

## 2022-05-21 NOTE — Progress Notes (Unsigned)
Cardiology Office Note:    Date:  05/22/2022   ID:  Stephanie Thornton, DOB [birth year], MRN [id number]  PCP:  Milford Cage, PA  [facility] Providers Cardiologist:  Fransico Him, MD    Referring MD: Milford Cage, PA   Chief Complaint:  Hospitalization Follow-up (S/p non-STEMI treated with a DES to the LCx)    Patient Profile: Coronary artery disease  Cath 04/2018: min non-obs CAD NSTEMI 04/2022 s/p DES to LCx OM1 totally occluded - Med Rx  Hypertension  Diabetes mellitus  Hyperlipidemia  OSA on CPAP Asthma  Hx of CVA (noted on CT scan in 04/2022) +Cigs   Prior CV Studies: LEFT HEART CATH AND CORONARY ANGIOGRAPHY 05/07/2022 LCx 99; OM1 100 LAD irregularities RCA mid 30 EF 55-65 PCI: 3 x 24 Synergy DES to the LCx RECOMMENDATIONS: She has had a completed infarct.  Possibly had recanalization of the totally occluded first obtuse marginal.  With LV hypokinesis and late presentation, there is some potential risk of mechanical complication such as free wall rupture.       ECHO COMPLETE WO IMAGING ENHANCING AGENT 05/07/2022 EF 50-55, mild concentric LVH, normal RVSF, mildly elevated PASP, RVSP 39.2, trivial MR, anterolateral HK      History of Present Illness:   Stephanie Thornton is a 51 y.o. female with the above problem list.  She was last seen by Dr. Radford Pax in 2019.  She was admitted 5/8-5/11 with a NSTEMI tx with a DES to the LCx.  Her OM1 is chronically occluded and tx medically.  EF was preserved on echocardiogram.  Post PCI course was unremarkable.  She presented back to the ED 05/11/2022 with mental status changes.  Workup was neg for acute CVA on CT.  But, there was evidence of an old MI.  Her MS improved in the ED and the plan is to f/u with neuro as an OP with a brain MRI.  She returns for f/u.    She is here with her son.   She has continued to have weakness since going to the ED on 5/13.  She did see her PCP last week.  It is not clear if she was  referred to neuro.  In the ED on 5/13, her condition improved and it was not felt that she needed inpatient MRI.  She is unable to lift her head in the room.  Her son notes that she is significantly weak.  She has occasional chest pain.  Overall, she feels better.  She does note shortness of breath.  She states that she has been using a walker because of her shortness of breath.  It is unclear how long her shortness of breath has been going on.  She has not had syncope.    Past Medical History:  Diagnosis Date   Anxiety    Arthritis    "knees, feet, back" (05/24/2015)   Benign essential HTN 05/01/2015   Bipolar disorder ([place]) diagnosed early 90s   Chronic bronchitis ([place])    "get it q yr"   Chronic lower back pain    Depression    Diabetes mellitus type II 2010   GERD (gastroesophageal reflux disease)    Headache    "maybe 3 times/wk" (05/24/2015)   History of hiatal hernia    History of stomach ulcers    Menopause    Migraine    "1-2/wk" (05/24/2015)   OSA on CPAP    Current Medications: Current Meds  Medication Sig  albuterol (PROVENTIL HFA;VENTOLIN HFA) 108 (90 Base) MCG/ACT inhaler Inhale 1-2 puffs into the lungs every 6 (six) hours as needed for wheezing or shortness of breath. (Patient taking differently: Inhale 2 puffs into the lungs every 6 (six) hours as needed for wheezing or shortness of breath.)   amitriptyline (ELAVIL) 150 MG tablet Take 150 mg by mouth at bedtime.   amLODipine (NORVASC) 10 MG tablet Take 1 tablet (10 mg total) by mouth daily.   aspirin 81 MG chewable tablet Chew 1 tablet (81 mg total) by mouth daily.   atorvastatin (LIPITOR) 80 MG tablet Take 1 tablet (80 mg total) by mouth daily.   baclofen (LIORESAL) 10 MG tablet Take 10 mg by mouth 3 (three) times daily.   busPIRone (BUSPAR) 10 MG tablet Take 10 mg by mouth 3 (three) times daily.   FARXIGA 10 MG TABS tablet Take 10 mg by mouth daily.   gabapentin (NEURONTIN) 300 MG capsule Take 1 capsule (300 mg total)  by mouth 3 (three) times daily.   LATUDA 80 MG TABS tablet Take 80 mg by mouth every evening.   metoprolol tartrate (LOPRESSOR) 25 MG tablet Take 1 tablet (25 mg total) by mouth 2 (two) times daily.   nitroGLYCERIN (NITROSTAT) 0.4 MG SL tablet Place 1 tablet (0.4 mg total) under the tongue every 5 (five) minutes x 3 doses as needed for chest pain.   NOVOLOG 100 UNIT/ML injection Inject 1 Units into the skin in the morning, at noon, in the evening, and at bedtime.   olmesartan (BENICAR) 40 MG tablet Take 1 tablet (40 mg total) by mouth daily.   OZEMPIC, 1 MG/DOSE, 4 MG/3ML SOPN Inject 1 mg as directed every Sunday.   ticagrelor (BRILINTA) 90 MG TABS tablet Take 1 tablet (90 mg total) by mouth 2 (two) times daily.   topiramate (TOPAMAX) 200 MG tablet Take 200 mg by mouth at bedtime.   traZODone (DESYREL) 100 MG tablet Take 150 mg by mouth at bedtime.    Allergies:   Lyrica [pregabalin], Rocephin [ceftriaxone sodium in dextrose], Sulfa antibiotics, Ultram [tramadol], Vancomycin, Glucotrol [glipizide], Keflex [cephalexin], Penicillins, and Janumet [sitagliptin-metformin hcl]   Social History   Tobacco Use   Smoking status: Some Days    Packs/day: 1.00    Years: 30.00    Pack years: 30.00    Types: Cigarettes   Smokeless tobacco: Never  Vaping Use   Vaping Use: Never used  Substance Use Topics   Alcohol use: No   Drug use: No    Family Hx: The patient's family history includes Bipolar disorder in her mother and son; CVA in her mother; Depression in her brother; Heart attack in her maternal aunt; Heart disease in her maternal aunt; Hypertension in her brother and sister.  Review of Systems  Neurological:  Positive for weakness.    EKGs/Labs/Other Test Reviewed:    EKG:  EKG is  ordered today.  The ekg ordered today demonstrates NSR, HR 87, left axis deviation, nonspecific ST-T wave changes, low voltage, poor R wave progression, QTc 478  EKG performed in the emergency room 05/11/2022  demonstrated sinus rhythm, heart rate 60, normal axis, nonspecific ST-T wave changes, QTc 442  Recent Labs: 05/07/2022: Magnesium 1.7; TSH 0.190 05/11/2022: ALT 22; Hemoglobin 11.9; Platelets 252 05/22/2022: BUN 8; Creatinine, Ser 0.91; NT-Pro BNP WILL FOLLOW; Potassium 4.2; Sodium 142   Recent Lipid Panel Recent Labs    05/07/22 0223  CHOL 111  TRIG 60  HDL 41  VLDL 12  LDLCALC 58     Risk Assessment/Calculations:         Physical Exam:    VS:  BP 122/76   Pulse 90   Ht 5' 2" (1.575 m)   Wt 162 lb 3.2 oz (73.6 kg)   SpO2 95%   BMI 29.67 kg/m     Wt Readings from Last 3 Encounters:  05/22/22 162 lb 3.2 oz (73.6 kg)  05/09/22 114 lb 13.8 oz (52.1 kg)  12/16/20 154 lb (69.9 kg)    Constitutional:      Appearance: Not in distress.  Neck:     Comments: Unable to assess JVD.  The patient is unable to lift her head.  With passive range of motion, she seems somewhat resistant to move her head. Pulmonary:     Effort: Pulmonary effort is normal.     Breath sounds: No wheezing. No rales.  Cardiovascular:     Normal rate. Regular rhythm. Normal S1. Normal S2.      Murmurs: There is no murmur.     Comments: Right wrist without hematoma Edema:    Peripheral edema present.    Ankle: bilateral trace edema of the ankle. Abdominal:     Palpations: Abdomen is soft.  Skin:    General: Skin is warm and dry.  Neurological:     Mental Status: Alert and oriented to person, place and time.     Comments: Upper and lower extremities seem to be weak on gross muscle testing bilaterally.        ASSESSMENT & PLAN:   NSTEMI (non-ST elevated myocardial infarction) ([place]) She was recently admitted with a non-STEMI treated with a DES to the LCx.  Her OM1 is totally occluded and is managed medically.  Her EF is normal by echocardiogram.  She continues to have chest pain and also notes shortness of breath.  She has some ankle edema.  She was previously on thiazide diuretic.  This was stopped due  to low blood pressures in the hospital.  She notes profound weakness.  She went to the emergency room several days after discharge with mental status changes.  She will not raise her head during exam to look at her neck veins.  She seems somewhat resistant to lift her head with passive range of motion.  She denies any history of neck issues or neck surgery.  From a cardiac standpoint she appears to be stable.  As noted I cannot assess her neck veins.  However, I think her volume status is stable.  I do not think her chest pain is cardiac.  Her electrocardiogram does not demonstrate any acute changes.  We discussed the importance of dual antiplatelet therapy.  Given her generalized weakness, I do not think she is a candidate for cardiac rehab at this time.  Continue aspirin 81 mg daily, Brilinta 90 mg twice daily, Lipitor 80 mg daily, metoprolol tartrate 25 mg twice daily.  Follow-up in 1 month.  Essential hypertension Blood pressure is well controlled.  Continue amlodipine 10 mg daily, metoprolol tartrate 25 mg twice daily, olmesartan 40 mg daily.  Hyperlipidemia LDL goal <70 Continue Lipitor 80 mg daily.  Arrange CMET, lipid panel in 3 months.  Weakness Etiology for her weakness is unclear.  She was also seen in the emergency room recently with mental status changes.  She had a CT scan that demonstrated an old stroke but nothing new.  Her clinical status improved at that time and she did not require admission or inpatient  MRI.  Etiology for her current symptoms or not entirely clear to me.  I think she needs a neurologic evaluation.  I will refer her to neurology for evaluation as soon as possible.  Shortness of breath Ejection fraction was normal on echocardiogram.  She does have some edema in her ankles.  I cannot assess her neck veins.  Her lungs are clear.  Obtain BMET, BNP today.  If her BNP is significantly elevated, I will place her on furosemide.  She did have low potassium in the emergency room  recently.  She will likely need potassium supplementation if diuretic therapy is started.     Cardiac Rehabilitation Eligibility Assessment  The patient is NOT ready to start cardiac rehabilitation due to: The patient is non-ambulatory or cannot walk a total of 30 minutes in 1 day.         Dispo:  Return in about 4 weeks (around 06/19/2022) for Close Follow Up with Dr. Radford Pax.   Medication Adjustments/Labs and Tests Ordered: Current medicines are reviewed at length with the patient today.  Concerns regarding medicines are outlined above.  Tests Ordered: Orders Placed This Encounter  Procedures   Basic metabolic panel   Pro b natriuretic peptide (BNP)   Comp Met (CMET)   Lipid panel   Ambulatory referral to Neurology   EKG 12-Lead   Medication Changes: No orders of the defined types were placed in this encounter.  Signed, Richardson Dopp, PA-C  05/22/2022 6:24 PM    [facility] [place], [place], [place]  [address] Phone: [phone]; Fax: [phone]

## 2022-05-21 NOTE — Telephone Encounter (Signed)
Pt insurance is active and benefits verified through [facility] Co-pay 0, DED 0/0 met, out of pocket $8,300/$556.99 met, co-insurance 0%. no pre-authorization required. Pol/[employer] 05/21/2022'@9' :52am, REF# [id number]   Will contact patient to see if she is interested in the Cardiac Rehab Program. If interested, patient will need to complete follow up appt. Once completed, patient will be contacted for scheduling upon review by the RN Navigator.

## 2022-05-22 ENCOUNTER — Encounter: Payer: Self-pay | Admitting: Physician Assistant

## 2022-05-22 ENCOUNTER — Ambulatory Visit (INDEPENDENT_AMBULATORY_CARE_PROVIDER_SITE_OTHER): Payer: Medicare HMO | Admitting: Physician Assistant

## 2022-05-22 VITALS — BP 122/76 | HR 90 | Ht 62.0 in | Wt 162.2 lb

## 2022-05-22 DIAGNOSIS — I10 Essential (primary) hypertension: Secondary | ICD-10-CM

## 2022-05-22 DIAGNOSIS — R4182 Altered mental status, unspecified: Secondary | ICD-10-CM

## 2022-05-22 DIAGNOSIS — E785 Hyperlipidemia, unspecified: Secondary | ICD-10-CM | POA: Diagnosis not present

## 2022-05-22 DIAGNOSIS — R0602 Shortness of breath: Secondary | ICD-10-CM | POA: Insufficient documentation

## 2022-05-22 DIAGNOSIS — E0800 Diabetes mellitus due to underlying condition with hyperosmolarity without nonketotic hyperglycemic-hyperosmolar coma (NKHHC): Secondary | ICD-10-CM | POA: Diagnosis not present

## 2022-05-22 DIAGNOSIS — R531 Weakness: Secondary | ICD-10-CM | POA: Insufficient documentation

## 2022-05-22 DIAGNOSIS — I214 Non-ST elevation (NSTEMI) myocardial infarction: Secondary | ICD-10-CM | POA: Diagnosis not present

## 2022-05-22 NOTE — Assessment & Plan Note (Signed)
Blood pressure is well controlled.  Continue amlodipine 10 mg daily, metoprolol tartrate 25 mg twice daily, olmesartan 40 mg daily.

## 2022-05-22 NOTE — Assessment & Plan Note (Signed)
Etiology for her weakness is unclear.  She was also seen in the emergency room recently with mental status changes.  She had a CT scan that demonstrated an old stroke but nothing new.  Her clinical status improved at that time and she did not require admission or inpatient MRI.  Etiology for her current symptoms or not entirely clear to me.  I think she needs a neurologic evaluation.  I will refer her to neurology for evaluation as soon as possible.

## 2022-05-22 NOTE — Assessment & Plan Note (Signed)
Ejection fraction was normal on echocardiogram.  She does have some edema in her ankles.  I cannot assess her neck veins.  Her lungs are clear.  Obtain BMET, BNP today.  If her BNP is significantly elevated, I will place her on furosemide.  She did have low potassium in the emergency room recently.  She will likely need potassium supplementation if diuretic therapy is started.

## 2022-05-22 NOTE — Patient Instructions (Addendum)
Medication Instructions:  Your physician recommends that you continue on your current medications as directed. Please refer to the Current Medication list given to you today.  *If you need a refill on your cardiac medications before your next appointment, please call your pharmacy*   Lab Work: TODAY:  BMET & PRO BNP  08/23/22 COME BACK TO THE OFFICE FOR FASTING LABS, NOTHING TO EAT OR DRINK THE NIGHT BEFORE:  LIPID & CMET  If you have labs (blood work) drawn today and your tests are completely normal, you will receive your results only by: [place] (if you have MyChart) OR A paper copy in the mail If you have any lab test that is abnormal or we need to change your treatment, we will call you to review the results.   Testing/Procedures: None ordered  You have been referred to NEUROLOGY     Follow-Up: At [facility], you and your health needs are our priority.  As part of our continuing mission to provide you with exceptional heart care, we have created designated Provider Care Teams.  These Care Teams include your primary Cardiologist (physician) and Advanced Practice Providers (APPs -  Physician Assistants and Nurse Practitioners) who all work together to provide you with the care you need, when you need it.  We recommend signing up for the patient portal called "MyChart".  Sign up information is provided on this After Visit Summary.  MyChart is used to connect with patients for Virtual Visits (Telemedicine).  Patients are able to view lab/test results, encounter notes, upcoming appointments, etc.  Non-urgent messages can be sent to your provider as well.   To learn more about what you can do with MyChart, go to [email or web address].    Your next appointment:   06/25/22 arrive at 9:25  The format for your next appointment:   In Person  Provider:   Fransico Him, MD     Other Instructions   Important Information About Sugar

## 2022-05-22 NOTE — Assessment & Plan Note (Signed)
She was recently admitted with a non-STEMI treated with a DES to the LCx.  Her OM1 is totally occluded and is managed medically.  Her EF is normal by echocardiogram.  She continues to have chest pain and also notes shortness of breath.  She has some ankle edema.  She was previously on thiazide diuretic.  This was stopped due to low blood pressures in the hospital.  She notes profound weakness.  She went to the emergency room several days after discharge with mental status changes.  She will not raise her head during exam to look at her neck veins.  She seems somewhat resistant to lift her head with passive range of motion.  She denies any history of neck issues or neck surgery.  From a cardiac standpoint she appears to be stable.  As noted I cannot assess her neck veins.  However, I think her volume status is stable.  I do not think her chest pain is cardiac.  Her electrocardiogram does not demonstrate any acute changes.  We discussed the importance of dual antiplatelet therapy.  Given her generalized weakness, I do not think she is a candidate for cardiac rehab at this time.  Continue aspirin 81 mg daily, Brilinta 90 mg twice daily, Lipitor 80 mg daily, metoprolol tartrate 25 mg twice daily.  Follow-up in 1 month.

## 2022-05-22 NOTE — Assessment & Plan Note (Signed)
Continue Lipitor 80 mg daily.  Arrange CMET, lipid panel in 3 months.

## 2022-05-23 ENCOUNTER — Other Ambulatory Visit (HOSPITAL_COMMUNITY): Payer: Self-pay

## 2022-05-23 ENCOUNTER — Telehealth: Payer: Self-pay | Admitting: *Deleted

## 2022-05-23 ENCOUNTER — Telehealth (HOSPITAL_COMMUNITY): Payer: Self-pay

## 2022-05-23 DIAGNOSIS — Z79899 Other long term (current) drug therapy: Secondary | ICD-10-CM

## 2022-05-23 LAB — BASIC METABOLIC PANEL
BUN/Creatinine Ratio: 9 (ref 9–23)
BUN: 8 mg/dL (ref 6–24)
CO2: 19 mmol/L — ABNORMAL LOW (ref 20–29)
Calcium: 8.8 mg/dL (ref 8.7–10.2)
Chloride: 109 mmol/L — ABNORMAL HIGH (ref 96–106)
Creatinine, Ser: 0.91 mg/dL (ref 0.57–1.00)
Glucose: 231 mg/dL — ABNORMAL HIGH (ref 70–99)
Potassium: 4.2 mmol/L (ref 3.5–5.2)
Sodium: 142 mmol/L (ref 134–144)
eGFR: 77 mL/min/{1.73_m2} (ref >59)

## 2022-05-23 LAB — PRO B NATRIURETIC PEPTIDE: NT-Pro BNP: 951 pg/mL — ABNORMAL HIGH (ref 0–249)

## 2022-05-23 NOTE — Telephone Encounter (Signed)
Transitions of Care Pharmacy  ° °Call attempted for a pharmacy transitions of care follow-up.  ° °Call attempt #1. Will follow-up in 2-3 days.  °  °

## 2022-05-23 NOTE — Telephone Encounter (Signed)
-----   Message from Beatrice Lecher, PA-C sent at 05/23/2022  7:53 AM EDT ----- Glucose elevated.  Creatinine, K+ normal. BNP elevated.  PLAN:  -Start Furosemide 40 mg once daily x 3 days, then 20 mg once daily. -Start K+ 20 mEq once daily x 3 days, then 10 mEq once daily  -BMET, BNP 1 week -F/u with PCP for diabetes  Tereso Newcomer, PA-C    05/23/2022 7:45 AM

## 2022-05-23 NOTE — Telephone Encounter (Signed)
I placed call to patient but was unable to leave a message as voicemail box was full.

## 2022-05-28 ENCOUNTER — Encounter: Payer: Self-pay | Admitting: Neurology

## 2022-05-28 MED ORDER — POTASSIUM CHLORIDE ER 10 MEQ PO TBCR: EXTENDED_RELEASE_TABLET | ORAL | 1 refills | Status: DC

## 2022-05-28 MED ORDER — FUROSEMIDE 20 MG PO TABS: ORAL_TABLET | ORAL | 1 refills | Status: DC

## 2022-05-28 NOTE — Telephone Encounter (Signed)
-----   Message from Macie Burows, RN sent at 05/28/2022  1:36 PM EDT -----  ----- Message ----- From: Beatrice Lecher, PA-C Sent: 05/23/2022   7:53 AM EDT To: [facility] Triage  Glucose elevated.  Creatinine, K+ normal. BNP elevated.  PLAN:  -Start Furosemide 40 mg once daily x 3 days, then 20 mg once daily. -Start K+ 20 mEq once daily x 3 days, then 10 mEq once daily  -BMET, BNP 1 week -F/u with PCP for diabetes  Tereso Newcomer, PA-C    05/23/2022 7:45 AM

## 2022-05-29 ENCOUNTER — Other Ambulatory Visit (HOSPITAL_COMMUNITY): Payer: Self-pay

## 2022-05-29 ENCOUNTER — Telehealth: Payer: Self-pay | Admitting: Cardiology

## 2022-05-29 ENCOUNTER — Telehealth (HOSPITAL_COMMUNITY): Payer: Self-pay

## 2022-05-29 MED ORDER — POTASSIUM CHLORIDE ER 10 MEQ PO TBCR: EXTENDED_RELEASE_TABLET | ORAL | 1 refills | Status: DC

## 2022-05-29 MED ORDER — FUROSEMIDE 20 MG PO TABS: ORAL_TABLET | ORAL | 1 refills | Status: DC

## 2022-05-29 NOTE — Telephone Encounter (Signed)
Pt's medication was sent to pt's pharmacy as requested. Confirmation received.  °

## 2022-05-29 NOTE — Telephone Encounter (Signed)
Transitions of Care Pharmacy   Call attempted for a pharmacy transitions of care follow-up. HIPAA appropriate voicemail was left with call back information provided.   Call attempt #2. Will follow-up in 2-3 days.    

## 2022-05-29 NOTE — Telephone Encounter (Signed)
*  STAT* If patient is at the pharmacy, call can be transferred to refill team.   1. Which medications need to be refilled? (please list name of each medication and dose if known) furosemide (LASIX) 20 MG tablet  potassium chloride (KLOR-CON) 10 MEQ tablet  2. Which pharmacy/location (including street and city if local pharmacy) is medication to be sent to? [place], [place]  3. Do they need a 30 day or 90 day supply? [id number]  Pharmacy is saying they didn't get the refill

## 2022-05-30 ENCOUNTER — Other Ambulatory Visit: Payer: Self-pay

## 2022-05-30 ENCOUNTER — Other Ambulatory Visit (HOSPITAL_COMMUNITY): Payer: Self-pay

## 2022-05-30 ENCOUNTER — Telehealth (HOSPITAL_COMMUNITY): Payer: Self-pay

## 2022-05-30 NOTE — Telephone Encounter (Signed)
Pharmacy Transitions of Care Follow-up Telephone Call  Date of discharge: 05/09/22  Discharge Diagnosis: NSTEMI  How have you been since you were released from the hospital? Patient has been doing well since leaving the hospital and had no questions or concerns. She was started on furosemide and potassium in a recent outpatient visit. She would like to use the free mail order service to continue receiving her medications.   Medication changes made at discharge: START taking these medications  START taking these medications  amLODipine 10 MG tablet Commonly known as: NORVASC Take 1 tablet (10 mg total) by mouth daily.  Aspirin Low Dose 81 MG chewable tablet Generic drug: aspirin Chew 1 tablet (81 mg total) by mouth daily.  atorvastatin 80 MG tablet Commonly known as: LIPITOR Take 1 tablet (80 mg total) by mouth daily.  Brilinta 90 MG Tabs tablet Generic drug: ticagrelor Take 1 tablet (90 mg total) by mouth 2 (two) times daily.  metoprolol tartrate 25 MG tablet Commonly known as: LOPRESSOR Take 1 tablet (25 mg total) by mouth 2 (two) times daily.  nitroGLYCERIN 0.4 MG SL tablet Commonly known as: NITROSTAT Place 1 tablet (0.4 mg total) under the tongue every 5 (five) minutes x 3 doses as needed for chest pain.  olmesartan 40 MG tablet Commonly known as: BENICAR Take 1 tablet (40 mg total) by mouth daily.   CHANGE how you take these medications  CHANGE how you take these medications  albuterol 108 (90 Base) MCG/ACT inhaler Commonly known as: VENTOLIN HFA Inhale 1-2 puffs into the lungs every 6 (six) hours as needed for wheezing or shortness of breath. What changed: how much to take   CONTINUE taking these medications  CONTINUE taking these medications  amitriptyline 150 MG tablet Commonly known as: ELAVIL  baclofen 10 MG tablet Commonly known as: LIORESAL  busPIRone 10 MG tablet Commonly known as: BUSPAR  Farxiga 10 MG Tabs tablet Generic drug: dapagliflozin  propanediol  gabapentin 300 MG capsule Commonly known as: NEURONTIN Take 1 capsule (300 mg total) by mouth 3 (three) times daily.  Latuda 80 MG Tabs tablet Generic drug: lurasidone  NovoLOG 100 UNIT/ML injection Generic drug: insulin aspart  oxyCODONE 15 MG immediate release tablet Commonly known as: ROXICODONE  Ozempic (1 MG/DOSE) 4 MG/3ML Sopn Generic drug: Semaglutide (1 MG/DOSE)  topiramate 200 MG tablet Commonly known as: TOPAMAX  traZODone 100 MG tablet Commonly known as: DESYREL   STOP taking these medications  STOP taking these medications  Linzess 72 MCG capsule Generic drug: linaclotide  Multi-Vitamins Tabs  Olmesartan-amLODIPine-HCTZ 40-10-25 MG Tabs  rosuvastatin 20 MG tablet Commonly known as: CRESTOR    Medication changes verified by the patient? yes    Medication Accessibility:  Home Pharmacy: [facility]   Was the patient provided with refills on discharged medications? yes   Have all prescriptions been transferred from [facility] to home pharmacy? [facility] mail   Is the patient able to afford medications? yes Notable copays: 0 Eligible patient assistance: no    Medication Review: TICAGRELOR (BRILINTA) Ticagrelor 90 mg BID initiated on 05/11.  - Educated patient on expected duration of therapy of aspirin 81 with ticagrelor. Aspirin will be continued indefinitely - Discussed importance of taking medication around the same time every day, - Reviewed potential DDIs with patient - Advised patient of medications to avoid (NSAIDs, aspirin maintenance doses>100 mg daily) - Educated that Tylenol (acetaminophen) will be the preferred analgesic to prevent risk of bleeding  - Emphasized importance of monitoring for signs  and symptoms of bleeding (abnormal bruising, prolonged bleeding, nose bleeds, bleeding from gums, discolored urine, black tarry stools)  - Educated patient to notify doctor if shortness of breath or abnormal heartbeat occur - Advised patient to  alert all providers of antiplatelet therapy prior to starting a new medication or having a procedure    Follow-up Appointments: Date Visit Type Length Department    06/05/2022  2:00 PM LAB 10 10 min [facility] Office [[id number]]  Patient Instructions:            06/07/2022  1:00 PM NEW PATIENT 60 60 min [facility] [[id number]]  Patient Instructions:            06/25/2022  9:40 AM OFFICE VISIT 20 min [facility] [place] Office [[id number]]    If their condition worsens, is the pt aware to call PCP or go to the Emergency Dept.? Yes  Final Patient Assessment:  Patient has been doing well since leaving the hospital and had no questions or concerns. She was started on furosemide and potassium in a recent outpatient visit. She would like to use the free mail order service to continue receiving her medications.   Jiles Crocker, PharmD Clinical Pharmacist [facility] 05/30/2022 12:15 PM

## 2022-05-31 DIAGNOSIS — Z006 Encounter for examination for normal comparison and control in clinical research program: Secondary | ICD-10-CM

## 2022-05-31 NOTE — Research (Signed)
Spoke with patient about the Ocean(a) trial and she is interested in participating. Screening visit scheduled for June 21 @ 0930

## 2022-06-04 ENCOUNTER — Other Ambulatory Visit (HOSPITAL_COMMUNITY): Payer: Self-pay

## 2022-06-05 ENCOUNTER — Other Ambulatory Visit: Payer: Medicare HMO

## 2022-06-05 DIAGNOSIS — Z79899 Other long term (current) drug therapy: Secondary | ICD-10-CM

## 2022-06-06 LAB — BASIC METABOLIC PANEL
BUN/Creatinine Ratio: 13 (ref 9–23)
BUN: 11 mg/dL (ref 6–24)
CO2: 20 mmol/L (ref 20–29)
Calcium: 8.5 mg/dL — ABNORMAL LOW (ref 8.7–10.2)
Chloride: 109 mmol/L — ABNORMAL HIGH (ref 96–106)
Creatinine, Ser: 0.86 mg/dL (ref 0.57–1.00)
Glucose: 243 mg/dL — ABNORMAL HIGH (ref 70–99)
Potassium: 4 mmol/L (ref 3.5–5.2)
Sodium: 142 mmol/L (ref 134–144)
eGFR: 82 mL/min/{1.73_m2} (ref >59)

## 2022-06-06 LAB — PRO B NATRIURETIC PEPTIDE: NT-Pro BNP: 726 pg/mL — ABNORMAL HIGH (ref 0–249)

## 2022-06-07 ENCOUNTER — Ambulatory Visit: Payer: Medicare HMO | Admitting: Neurology

## 2022-06-25 ENCOUNTER — Ambulatory Visit: Payer: Medicare HMO | Admitting: Cardiology

## 2022-06-26 ENCOUNTER — Ambulatory Visit: Payer: Medicare HMO | Admitting: Neurology

## 2022-06-26 ENCOUNTER — Encounter: Payer: Self-pay | Admitting: Neurology

## 2022-06-26 DIAGNOSIS — Z029 Encounter for administrative examinations, unspecified: Secondary | ICD-10-CM

## 2022-07-04 ENCOUNTER — Other Ambulatory Visit (HOSPITAL_COMMUNITY): Payer: Self-pay

## 2022-07-07 ENCOUNTER — Emergency Department (HOSPITAL_COMMUNITY): Payer: Medicare HMO

## 2022-07-07 ENCOUNTER — Encounter (HOSPITAL_COMMUNITY): Payer: Self-pay

## 2022-07-07 ENCOUNTER — Observation Stay (HOSPITAL_COMMUNITY)
Admission: EM | Admit: 2022-07-07 | Discharge: 2022-07-10 | Disposition: A | Discharge status: Home / Self Care | Payer: Medicare HMO | Attending: Family Medicine | Admitting: Family Medicine

## 2022-07-07 DIAGNOSIS — R350 Frequency of micturition: Secondary | ICD-10-CM | POA: Insufficient documentation

## 2022-07-07 DIAGNOSIS — G4733 Obstructive sleep apnea (adult) (pediatric): Secondary | ICD-10-CM | POA: Diagnosis not present

## 2022-07-07 DIAGNOSIS — E1159 Type 2 diabetes mellitus with other circulatory complications: Secondary | ICD-10-CM | POA: Diagnosis not present

## 2022-07-07 DIAGNOSIS — R1013 Epigastric pain: Secondary | ICD-10-CM | POA: Insufficient documentation

## 2022-07-07 DIAGNOSIS — D72829 Elevated white blood cell count, unspecified: Secondary | ICD-10-CM | POA: Diagnosis present

## 2022-07-07 DIAGNOSIS — R2689 Other abnormalities of gait and mobility: Secondary | ICD-10-CM | POA: Diagnosis not present

## 2022-07-07 DIAGNOSIS — F319 Bipolar disorder, unspecified: Secondary | ICD-10-CM | POA: Diagnosis present

## 2022-07-07 DIAGNOSIS — N179 Acute kidney failure, unspecified: Secondary | ICD-10-CM | POA: Diagnosis not present

## 2022-07-07 DIAGNOSIS — Z955 Presence of coronary angioplasty implant and graft: Secondary | ICD-10-CM | POA: Insufficient documentation

## 2022-07-07 DIAGNOSIS — K859 Acute pancreatitis without necrosis or infection, unspecified: Secondary | ICD-10-CM | POA: Diagnosis not present

## 2022-07-07 DIAGNOSIS — E876 Hypokalemia: Secondary | ICD-10-CM | POA: Diagnosis present

## 2022-07-07 DIAGNOSIS — I10 Essential (primary) hypertension: Secondary | ICD-10-CM | POA: Diagnosis present

## 2022-07-07 DIAGNOSIS — E042 Nontoxic multinodular goiter: Secondary | ICD-10-CM | POA: Insufficient documentation

## 2022-07-07 DIAGNOSIS — E041 Nontoxic single thyroid nodule: Secondary | ICD-10-CM | POA: Diagnosis present

## 2022-07-07 DIAGNOSIS — K209 Esophagitis, unspecified without bleeding: Secondary | ICD-10-CM | POA: Diagnosis not present

## 2022-07-07 DIAGNOSIS — I152 Hypertension secondary to endocrine disorders: Secondary | ICD-10-CM | POA: Diagnosis present

## 2022-07-07 DIAGNOSIS — Z79899 Other long term (current) drug therapy: Secondary | ICD-10-CM | POA: Insufficient documentation

## 2022-07-07 DIAGNOSIS — I252 Old myocardial infarction: Secondary | ICD-10-CM | POA: Insufficient documentation

## 2022-07-07 DIAGNOSIS — F314 Bipolar disorder, current episode depressed, severe, without psychotic features: Secondary | ICD-10-CM | POA: Diagnosis present

## 2022-07-07 DIAGNOSIS — E1169 Type 2 diabetes mellitus with other specified complication: Secondary | ICD-10-CM | POA: Diagnosis not present

## 2022-07-07 DIAGNOSIS — E1110 Type 2 diabetes mellitus with ketoacidosis without coma: Secondary | ICD-10-CM | POA: Diagnosis present

## 2022-07-07 DIAGNOSIS — E0810 Diabetes mellitus due to underlying condition with ketoacidosis without coma: Secondary | ICD-10-CM

## 2022-07-07 DIAGNOSIS — F32A Depression, unspecified: Secondary | ICD-10-CM | POA: Diagnosis present

## 2022-07-07 DIAGNOSIS — E785 Hyperlipidemia, unspecified: Secondary | ICD-10-CM | POA: Diagnosis present

## 2022-07-07 DIAGNOSIS — E782 Mixed hyperlipidemia: Secondary | ICD-10-CM | POA: Diagnosis present

## 2022-07-07 DIAGNOSIS — Z794 Long term (current) use of insulin: Secondary | ICD-10-CM | POA: Insufficient documentation

## 2022-07-07 DIAGNOSIS — K2100 Gastro-esophageal reflux disease with esophagitis, without bleeding: Secondary | ICD-10-CM | POA: Diagnosis present

## 2022-07-07 DIAGNOSIS — I2510 Atherosclerotic heart disease of native coronary artery without angina pectoris: Secondary | ICD-10-CM | POA: Diagnosis present

## 2022-07-07 LAB — BASIC METABOLIC PANEL
Anion gap: 23 — ABNORMAL HIGH (ref 5–15)
Anion gap: 11 (ref 5–15)
BUN: 20 mg/dL (ref 6–20)
BUN: 18 mg/dL (ref 6–20)
CO2: 19 mmol/L — ABNORMAL LOW (ref 22–32)
CO2: 26 mmol/L (ref 22–32)
Calcium: 11.1 mg/dL — ABNORMAL HIGH (ref 8.9–10.3)
Calcium: 9.9 mg/dL (ref 8.9–10.3)
Chloride: 102 mmol/L (ref 98–111)
Chloride: 109 mmol/L (ref 98–111)
Creatinine, Ser: 1.57 mg/dL — ABNORMAL HIGH (ref 0.44–1.00)
Creatinine, Ser: 1.07 mg/dL — ABNORMAL HIGH (ref 0.44–1.00)
GFR, Estimated: 40 mL/min — ABNORMAL LOW (ref >60)
GFR, Estimated: >60 mL/min (ref >60)
Glucose, Bld: 346 mg/dL — ABNORMAL HIGH (ref 70–99)
Glucose, Bld: 141 mg/dL — ABNORMAL HIGH (ref 70–99)
Potassium: 2.8 mmol/L — ABNORMAL LOW (ref 3.5–5.1)
Potassium: 3.7 mmol/L (ref 3.5–5.1)
Sodium: 144 mmol/L (ref 135–145)
Sodium: 146 mmol/L — ABNORMAL HIGH (ref 135–145)

## 2022-07-07 LAB — I-STAT BETA HCG BLOOD, ED (MC, WL, AP ONLY): I-stat hCG, quantitative: <5 m[IU]/mL (ref <5)

## 2022-07-07 LAB — CBG MONITORING, ED
Glucose-Capillary: 352 mg/dL — ABNORMAL HIGH (ref 70–99)
Glucose-Capillary: 125 mg/dL — ABNORMAL HIGH (ref 70–99)
Glucose-Capillary: 89 mg/dL (ref 70–99)

## 2022-07-07 LAB — CBC WITH DIFFERENTIAL/PLATELET
Abs Immature Granulocytes: 0.06 10*3/uL (ref 0.00–0.07)
Basophils Absolute: 0 10*3/uL (ref 0.0–0.1)
Basophils Relative: 0 %
Eosinophils Absolute: 0 10*3/uL (ref 0.0–0.5)
Eosinophils Relative: 0 %
HCT: 49.1 % — ABNORMAL HIGH (ref 36.0–46.0)
Hemoglobin: 16.2 g/dL — ABNORMAL HIGH (ref 12.0–15.0)
Immature Granulocytes: 0 %
Lymphocytes Relative: 16 %
Lymphs Abs: 2.5 10*3/uL (ref 0.7–4.0)
MCH: 26.6 pg (ref 26.0–34.0)
MCHC: 33 g/dL (ref 30.0–36.0)
MCV: 80.6 fL (ref 80.0–100.0)
Monocytes Absolute: 0.9 10*3/uL (ref 0.1–1.0)
Monocytes Relative: 6 %
Neutro Abs: 12.2 10*3/uL — ABNORMAL HIGH (ref 1.7–7.7)
Neutrophils Relative %: 78 %
Platelets: 367 10*3/uL (ref 150–400)
RBC: 6.09 MIL/uL — ABNORMAL HIGH (ref 3.87–5.11)
RDW: 14.5 % (ref 11.5–15.5)
WBC: 15.7 10*3/uL — ABNORMAL HIGH (ref 4.0–10.5)
nRBC: 0 % (ref 0.0–0.2)

## 2022-07-07 LAB — I-STAT VENOUS BLOOD GAS, ED
Acid-Base Excess: 3 mmol/L — ABNORMAL HIGH (ref 0.0–2.0)
Bicarbonate: 20.9 mmol/L (ref 20.0–28.0)
Calcium, Ion: 1.2 mmol/L (ref 1.15–1.40)
HCT: 52 % — ABNORMAL HIGH (ref 36.0–46.0)
Hemoglobin: 17.7 g/dL — ABNORMAL HIGH (ref 12.0–15.0)
O2 Saturation: 69 %
Potassium: 2.6 mmol/L — CL (ref 3.5–5.1)
Sodium: 146 mmol/L — ABNORMAL HIGH (ref 135–145)
TCO2: 22 mmol/L (ref 22–32)
pCO2, Ven: 19.2 mmHg — CL (ref 44–60)
pH, Ven: 7.646 (ref 7.25–7.43)
pO2, Ven: 27 mmHg — CL (ref 32–45)

## 2022-07-07 LAB — ACETAMINOPHEN LEVEL: Acetaminophen (Tylenol), Serum: <10 ug/mL — ABNORMAL LOW (ref 10–30)

## 2022-07-07 LAB — ETHANOL: Alcohol, Ethyl (B): <10 mg/dL (ref <10)

## 2022-07-07 LAB — MAGNESIUM: Magnesium: 2.4 mg/dL (ref 1.7–2.4)

## 2022-07-07 LAB — SALICYLATE LEVEL: Salicylate Lvl: <7 mg/dL — ABNORMAL LOW (ref 7.0–30.0)

## 2022-07-07 LAB — BETA-HYDROXYBUTYRIC ACID: Beta-Hydroxybutyric Acid: 1.4 mmol/L — ABNORMAL HIGH (ref 0.05–0.27)

## 2022-07-07 LAB — LIPASE, BLOOD: Lipase: 148 U/L — ABNORMAL HIGH (ref 11–51)

## 2022-07-07 LAB — TROPONIN I (HIGH SENSITIVITY)
Troponin I (High Sensitivity): 31 ng/L — ABNORMAL HIGH (ref <18)
Troponin I (High Sensitivity): 36 ng/L — ABNORMAL HIGH (ref <18)

## 2022-07-07 MED ORDER — MORPHINE SULFATE (PF) 4 MG/ML IV SOLN
4.0000 mg | Freq: Once | INTRAVENOUS | Status: AC
  Administered 2022-07-07: 4 mg via INTRAVENOUS
  Filled 2022-07-07: qty 1

## 2022-07-07 MED ORDER — PANTOPRAZOLE SODIUM 40 MG IV SOLR
40.0000 mg | Freq: Once | INTRAVENOUS | Status: AC
Start: 2022-07-07 — End: 2022-07-07
  Administered 2022-07-07: 40 mg via INTRAVENOUS
  Filled 2022-07-07: qty 10

## 2022-07-07 MED ORDER — LACTATED RINGERS IV BOLUS
1000.0000 mL | INTRAVENOUS | Status: AC
  Administered 2022-07-07 (×2): 1000 mL via INTRAVENOUS

## 2022-07-07 MED ORDER — ONDANSETRON HCL 4 MG/2ML IJ SOLN
4.0000 mg | Freq: Once | INTRAMUSCULAR | Status: AC
Start: 2022-07-07 — End: 2022-07-07
  Administered 2022-07-07: 4 mg via INTRAVENOUS
  Filled 2022-07-07: qty 2

## 2022-07-07 MED ORDER — DEXTROSE 50 % IV SOLN: 0.0000 mL | INTRAVENOUS | Status: DC | PRN

## 2022-07-07 MED ORDER — POTASSIUM CHLORIDE 10 MEQ/100ML IV SOLN
10.0000 meq | INTRAVENOUS | Status: DC
  Administered 2022-07-07: 10 meq via INTRAVENOUS

## 2022-07-07 MED ORDER — LACTATED RINGERS IV SOLN: INTRAVENOUS | Status: DC

## 2022-07-07 MED ORDER — LACTATED RINGERS IV SOLN
INTRAVENOUS | Status: DC
Start: 2022-07-07 — End: 2022-07-07

## 2022-07-07 MED ORDER — POTASSIUM CHLORIDE 10 MEQ/100ML IV SOLN
10.0000 meq | INTRAVENOUS | Status: AC
  Administered 2022-07-07 (×2): 10 meq via INTRAVENOUS
  Filled 2022-07-07 (×2): qty 100

## 2022-07-07 MED ORDER — ENOXAPARIN SODIUM 40 MG/0.4ML IJ SOSY
40.0000 mg | PREFILLED_SYRINGE | INTRAMUSCULAR | Status: DC
  Administered 2022-07-08 – 2022-07-10 (×3): 40 mg via SUBCUTANEOUS
  Filled 2022-07-07 (×3): qty 0.4

## 2022-07-07 MED ORDER — DEXTROSE IN LACTATED RINGERS 5 % IV SOLN: INTRAVENOUS | Status: DC

## 2022-07-07 MED ORDER — INSULIN ASPART 100 UNIT/ML IJ SOLN: 0.0000 [IU] | INTRAMUSCULAR | Status: DC

## 2022-07-07 MED ORDER — IOHEXOL 350 MG/ML SOLN
75.0000 mL | Freq: Once | INTRAVENOUS | Status: AC | PRN
  Administered 2022-07-07: 75 mL via INTRAVENOUS

## 2022-07-07 MED ORDER — ONDANSETRON HCL 4 MG/2ML IJ SOLN: 4.0000 mg | Freq: Four times a day (QID) | INTRAMUSCULAR | Status: DC | PRN

## 2022-07-07 MED ORDER — TICAGRELOR 90 MG PO TABS
90.0000 mg | ORAL_TABLET | Freq: Two times a day (BID) | ORAL | Status: DC
  Administered 2022-07-08 – 2022-07-10 (×5): 90 mg via ORAL
  Filled 2022-07-07 (×5): qty 1

## 2022-07-07 MED ORDER — LACTATED RINGERS IV BOLUS
1000.0000 mL | Freq: Once | INTRAVENOUS | Status: AC
Start: 2022-07-07 — End: 2022-07-07
  Administered 2022-07-07: 1000 mL via INTRAVENOUS

## 2022-07-07 MED ORDER — ASPIRIN 81 MG PO CHEW
81.0000 mg | CHEWABLE_TABLET | Freq: Every day | ORAL | Status: DC
  Administered 2022-07-08 – 2022-07-09 (×2): 81 mg via ORAL
  Filled 2022-07-07 (×2): qty 1

## 2022-07-07 MED ORDER — MORPHINE SULFATE (PF) 2 MG/ML IV SOLN: 1.0000 mg | INTRAVENOUS | Status: DC | PRN

## 2022-07-07 MED ORDER — INSULIN REGULAR(HUMAN) IN NACL 100-0.9 UT/100ML-% IV SOLN
INTRAVENOUS | Status: DC
  Filled 2022-07-07: qty 100

## 2022-07-07 MED ORDER — PANTOPRAZOLE SODIUM 40 MG IV SOLR
40.0000 mg | Freq: Two times a day (BID) | INTRAVENOUS | Status: DC
Start: 2022-07-08 — End: 2022-07-09
  Administered 2022-07-08 – 2022-07-09 (×3): 40 mg via INTRAVENOUS
  Filled 2022-07-07 (×3): qty 10

## 2022-07-07 NOTE — Assessment & Plan Note (Addendum)
Resolved  Seems to have been precipitated with nausea/vomiting/abdominal discomfort, mild pancreatitis? Now back to her baseline, she wants to resume insulin pump at discharge She needs to follow with endocrinology outpatient  Hold ozempic and farxiga at discharge

## 2022-07-07 NOTE — Assessment & Plan Note (Addendum)
Suspect reactive, follow outpatient

## 2022-07-07 NOTE — Assessment & Plan Note (Addendum)
Resume home meds

## 2022-07-07 NOTE — Assessment & Plan Note (Signed)
wnl

## 2022-07-07 NOTE — Assessment & Plan Note (Signed)
resolved 

## 2022-07-07 NOTE — Assessment & Plan Note (Addendum)
Mildly elevated lipase CT without evidence of pancreatitis Mild pancreatitis

## 2022-07-07 NOTE — H&P (Signed)
History and Physical    Stephanie Thornton [id number] DOB: 1971/11/28 DOA: 07/07/2022  PCP: Milford Cage, PA  Patient coming from: Home  I have personally briefly reviewed patient's old medical records in [place]  Chief Complaint: Nausea, vomiting  HPI: Stephanie Thornton is a 51 y.o. female with medical history significant for CAD with recent NSTEMI s/p to LCx 05/07/2022, T2DM, asthma, HTN, HLD, depression/anxiety/bipolar disorder, OSA not using CPAP who presented to the ED for evaluation of nausea, vomiting, urinary frequency.  Patient reports new onset of frequent nausea, vomiting, epigastric pain over the last 2 days.  She has not been able to maintain any adequate oral intake.  She has associated increased urinary frequency without dysuria.  She reports significant heartburn/reflux symptoms.  She has had decreased bowel movements.  She has had some intermittent chest discomfort.  She is feeling dehydrated and fatigued.  She notes that CBGs have been running high at home up to 475 today.  She gave herself 10 units of insulin prior to arrival.  She says she normally runs in 130-150 range.  She says she uses an insulin pump at home but does not have it with her now.  ED Course  Labs/Imaging on admission: I have personally reviewed following labs and imaging studies.  Initial vitals showed BP 127/90, pulse 134, RR 15, temp 97.8 F, SPO2 100% on room air.  Labs show sodium 144, potassium 2.8, magnesium 2.4, bicarb 19, BUN 20, creatinine 1.57, serum glucose 346, calcium 11.1, anion gap 23, beta hydroxybutyrate 1.40, lipase 148, WBC 15.7, hemoglobin 16.2, platelets 367,000.  Serum ethanol, acetaminophen, salicylate levels undetectable.  Troponin 31.  VBG showed pH 7.646, PCO2 19.2, PO2 27.  Urinalysis ordered and pending collection.  Portable chest x-ray negative for focal consolidation, edema, effusion.  CTA chest/abdomen/pelvis negative for evidence of aortic dissection or  aneurysm.  Mild esophageal wall thickening suspicious for esophagitis noted.  1.1 cm incidental thyroid nodule with heterogeneous and enlarged thyroid noted.  Nonobstructing left renal calculus and aortic atherosclerosis seen.  Patient was given 2 L LR, IV K 10 mEq x 3, IV Zofran, IV Protonix 40 mg, IV morphine 4 mg, and started on insulin infusion.  The hospitalist service was consulted to admit for further evaluation and management.  Review of Systems: All systems reviewed and are negative except as documented in history of present illness above.   Past Medical History:  Diagnosis Date   Anxiety    Arthritis    "knees, feet, back" (05/24/2015)   Benign essential HTN 05/01/2015   Bipolar disorder ([place]) diagnosed early 90s   Chronic bronchitis ([place])    "get it q yr"   Chronic lower back pain    Depression    Diabetes mellitus type II 2010   GERD (gastroesophageal reflux disease)    Headache    "maybe 3 times/wk" (05/24/2015)   History of hiatal hernia    History of stomach ulcers    Menopause    Migraine    "1-2/wk" (05/24/2015)   OSA on CPAP     Past Surgical History:  Procedure Laterality Date   ABDOMINAL HYSTERECTOMY  2007   BUNIONECTOMY Bilateral    CARPAL TUNNEL RELEASE Right    CORONARY STENT INTERVENTION N/A 05/07/2022   Procedure: CORONARY STENT INTERVENTION;  Surgeon: Belva Crome, MD;  Location: [facility];  Service: Cardiovascular;  Laterality: N/A;   HAMMER TOE SURGERY Left    LEFT HEART CATH AND CORONARY  ANGIOGRAPHY N/A 05/20/2018   Procedure: LEFT HEART CATH AND CORONARY ANGIOGRAPHY;  Surgeon: Marykay Lex, MD;  Location[facility]VAS[facility];  Service: Cardiovascular;  Laterality: N/A;   LEFT HEART CATH AND CORONARY ANGIOGRAPHY N/A 05/07/2022   Procedure: LEFT HEART CATH AND CORONARY ANGIOGRAPHY;  Surgeon: Lyn Records, MD;  Location: [facility] INVASIVE [facility];  Service: Cardiovascular;  Laterality: N/A;   TONSILLECTOMY Bilateral 05/24/2015   Procedure:  TONSILLECTOMY;  Surgeon: Christia Reading, MD;  Location: [facility] OR;  Service: ENT;  Laterality: Bilateral;    Social History:  reports that she has been smoking cigarettes. She has a 30.00 pack-year smoking history. She has never used smokeless tobacco. She reports that she does not drink alcohol and does not use drugs.  Allergies  Allergen Reactions   Lyrica [Pregabalin] Shortness Of Breath and Rash   Rocephin [Ceftriaxone Sodium In Dextrose] Shortness Of Breath   Sulfa Antibiotics Hives and Shortness Of Breath   Ultram [Tramadol] Shortness Of Breath    Had asthma attack   Vancomycin Anaphylaxis    Per Dr. Flonnie Overman   Glucotrol [Glipizide] Hives   Keflex [Cephalexin] Hives   Penicillins Hives    Has patient had a PCN reaction causing immediate rash, facial/tongue/throat swelling, SOB or lightheadedness with hypotension: YES Has patient had a PCN reaction causing severe rash involving mucus membranes or skin necrosis: NO Has patient had a PCN reaction that required hospitalization: YES Has patient had a PCN reaction occurring within the last 10 years: YES If all of the above answers are "NO", then may proceed with Cephalosporin use.   Janumet [Sitagliptin-Metformin Hcl] Nausea Only    Family History  Problem Relation Age of Onset   Bipolar disorder Mother    CVA Mother    Bipolar disorder Son    Depression Brother        schizoaffective d/o   Hypertension Sister    Heart disease Maternal Aunt    Heart attack Maternal Aunt    Hypertension Brother      Prior to Admission medications   Medication Sig Start Date End Date Taking? Authorizing Provider  albuterol (PROVENTIL HFA;VENTOLIN HFA) 108 (90 Base) MCG/ACT inhaler Inhale 1-2 puffs into the lungs every 6 (six) hours as needed for wheezing or shortness of breath. Patient taking differently: Inhale 2 puffs into the lungs every 6 (six) hours as needed for wheezing or shortness of breath. 03/04/19   Grayce Sessions, NP  amitriptyline  (ELAVIL) 150 MG tablet Take 150 mg by mouth at bedtime. 01/28/19   [provider]  amLODipine (NORVASC) 10 MG tablet Take 1 tablet (10 mg total) by mouth daily. 05/09/22   Cyndi Bender, NP  aspirin 81 MG chewable tablet Chew 1 tablet (81 mg total) by mouth daily. 05/09/22   Cyndi Bender, NP  atorvastatin (LIPITOR) 80 MG tablet Take 1 tablet (80 mg total) by mouth daily. 05/09/22   Cyndi Bender, NP  baclofen (LIORESAL) 10 MG tablet Take 10 mg by mouth 3 (three) times daily. 05/05/22   [provider]  busPIRone (BUSPAR) 10 MG tablet Take 10 mg by mouth 3 (three) times daily. 01/25/19   [provider]  FARXIGA 10 MG TABS tablet Take 10 mg by mouth daily. 09/19/20   [provider]  furosemide (LASIX) 20 MG tablet Take 2 tablets by mouth daily X's 3 days then reduce to 1 tablet by mouth daily. 05/29/22   Tereso Newcomer T, PA-C  gabapentin (NEURONTIN) 300 MG capsule  Take 1 capsule (300 mg total) by mouth 3 (three) times daily. 06/29/19   Kerin Perna, NP  LATUDA 80 MG TABS tablet Take 80 mg by mouth every evening. 02/01/19   [provider]  metoprolol tartrate (LOPRESSOR) 25 MG tablet Take 1 tablet (25 mg total) by mouth 2 (two) times daily. 05/09/22   Margie Billet, NP  nitroGLYCERIN (NITROSTAT) 0.4 MG SL tablet Place 1 tablet (0.4 mg total) under the tongue every 5 (five) minutes x 3 doses as needed for chest pain. 05/09/22   Margie Billet, NP  NOVOLOG 100 UNIT/ML injection Inject 1 Units into the skin in the morning, at noon, in the evening, and at bedtime. 11/21/20   [provider]  olmesartan (BENICAR) 40 MG tablet Take 1 tablet (40 mg total) by mouth daily. 05/09/22 05/09/23  Margie Billet, NP  oxyCODONE (ROXICODONE) 15 MG immediate release tablet Take 15 mg by mouth 4 (four) times daily as needed for pain. Patient not taking: Reported on 05/22/2022 02/20/19   [provider]  OZEMPIC, 1 MG/DOSE, 4 MG/3ML SOPN Inject 1 mg as directed every Sunday. 12/14/20    [provider]  potassium chloride (KLOR-CON) 10 MEQ tablet Take 2 tablets by mouth daily X's 3 days then reduce to 1 tablet by mouth daily. 05/29/22   Richardson Dopp T, PA-C  ticagrelor (BRILINTA) 90 MG TABS tablet Take 1 tablet (90 mg total) by mouth 2 (two) times daily. 05/09/22   Margie Billet, NP  topiramate (TOPAMAX) 200 MG tablet Take 200 mg by mouth at bedtime. 01/28/19   [provider]  traZODone (DESYREL) 100 MG tablet Take 150 mg by mouth at bedtime. 02/24/19   [provider]    Physical Exam: Vitals:   07/07/22 1843 07/07/22 1844  BP: 127/90   Pulse: (!) 134   Resp: 15   Temp: 97.8 F (36.6 C)   TempSrc: Axillary   SpO2: 100%   Weight:  73.6 kg  Height:  5\' 2"  (1.575 m)   Constitutional: Resting supine in bed, appears fatigued but in NAD, calm, answering questions appropriately Eyes: EOMI, lids and conjunctivae normal ENMT: Mucous membranes are dry. Posterior pharynx clear of any exudate or lesions. Neck: normal, supple, no masses. Respiratory: clear to auscultation bilaterally, no wheezing, no crackles. Normal respiratory effort. No accessory muscle use.  Cardiovascular: Tachycardic, no murmurs / rubs / gallops. No extremity edema. 2+ pedal pulses. Abdomen: Epigastric tenderness, no masses palpated.  Musculoskeletal: no clubbing / cyanosis. No joint deformity upper and lower extremities. Good ROM, no contractures. Normal muscle tone.  Skin: no rashes, lesions, ulcers. No induration Neurologic: Sensation intact. Strength 5/5 in all 4.  Psychiatric: Normal judgment and insight. Alert and oriented x 3. Normal mood.   EKG: Personally reviewed. Sinus tachycardia, rate 106, RAE, LPFB.  Rate is faster when compared to prior.  Assessment/Plan Principal Problem:   Diabetic ketoacidosis without coma associated with type 2 diabetes mellitus ([place]) Active Problems:   Acute pancreatitis without infection or necrosis   AKI (acute kidney injury) ([place])    Hypokalemia   Esophagitis   Hypercalcemia   Depression   Hypertension associated with diabetes ([place])   Hyperlipidemia associated with type 2 diabetes mellitus ([place])   Coronary artery disease   Thyroid nodule   Leukocytosis   OSA (obstructive sleep apnea)   Stephanie Thornton is a 51 y.o. female with medical history significant for CAD with recent NSTEMI s/p to LCx 05/07/2022, T2DM, asthma, HTN, HLD,  depression/anxiety/bipolar disorder, OSA not using CPAP who is admitted with DKA associated with type 2 diabetes triggered by acute pancreatitis with associated dehydration, AKI, and contraction alkalosis.  Assessment and Plan: * Diabetic ketoacidosis without coma associated with type 2 diabetes mellitus (HCC) Clinically appears to be presented in DKA likely triggered by acute pancreatitis with concomitant contraction alkalosis. -Continue insulin infusion -IV fluids with LR and transition to D5 in LR when CBG <250 -Follow serial BMET -Keep n.p.o. -Hold home Ozempic and Farxiga -Likely transition to subcutaneous insulin soon--sensitive SSI q4h for now while n.p.o., adjust and add basal insulin as indicated  Acute pancreatitis without infection or necrosis Presenting with nausea, vomiting, epigastric pain, lipase 148 consistent with acute pancreatitis.  No evidence of gallstones, pancreatic ductal dilatation, pancreatic necrosis on CT imaging.  Consider medication associated (Farxiga, Ozempic). -Keep n.p.o. -Give additional 1 L LR bolus and continue IV fluid hydration as above -IV morphine as needed for severe pain  AKI (acute kidney injury) (HCC) Secondary to volume depletion in setting of DKA and acute pancreatitis.  Improving with IV fluids.  Esophagitis Esophageal wall thickening seen on CT imaging.  Patient does report significant heartburn/reflux likely related to frequent emesis. -Continue IV Protonix 40 mg BID for now  Hypokalemia Improved with IV  supplementation.  Hypercalcemia Secondary to dehydration.  Improved with IV fluid hydration.  OSA (obstructive sleep apnea) Has not been using CPAP.  Use supplemental oxygen as needed.  Leukocytosis Likely combination of reactive leukocytosis and hemoconcentration.  No obvious active infection.  Continue to monitor.  Thyroid nodule 1.1 cm incidental thyroid nodule with heterogeneous enlarged thyroid seen on CT imaging.  Has had suppressed TSH  with normal free T3 and T4 two months ago. -Follow-up thyroid ultrasound -Would hold home Ozempic pending further evaluation of thyroid nodule  Coronary artery disease Recent NSTEMI s/p DES to LCx 05/07/2022.  Troponin minimally elevated at 31 and relatively flat on repeat.  EKG shows sinus tachycardia. -Resume aspirin and Brilinta as soon as tolerating oral meds -Holding Lopressor and statin for now  Hyperlipidemia associated with type 2 diabetes mellitus (HCC) Resume statin when tolerating oral.  Hypertension associated with diabetes (HCC) Resume amlodipine, Lopressor, olmesartan when tolerating oral and renal function improved.  Depression Anxiety/bipolar disorder Resume home meds when tolerating oral.  DVT prophylaxis: enoxaparin (LOVENOX) injection 40 mg Start: 07/08/22 1000 Code Status: Full code, confirmed with patient on admission Family Communication: Discussed with brother at bedside Disposition Plan: From home and likely discharge to home pending clinical progress Consults called: None Severity of Illness: The appropriate patient status for this patient is INPATIENT. Inpatient status is judged to be reasonable and necessary in order to provide the required intensity of service to ensure the patient's safety. The patient's presenting symptoms, physical exam findings, and initial radiographic and laboratory data in the context of their chronic comorbidities is felt to place them at high risk for further clinical deterioration.  Furthermore, it is not anticipated that the patient will be medically stable for discharge from the hospital within 2 midnights of admission.   * I certify that at the point of admission it is my clinical judgment that the patient will require inpatient hospital care spanning beyond 2 midnights from the point of admission due to high intensity of service, high risk for further deterioration and high frequency of surveillance required.Darreld Mclean MD [facility]  If 7PM-7AM, please contact night-coverage [email or web address]  07/07/2022, 11:34 PM

## 2022-07-07 NOTE — Assessment & Plan Note (Signed)
Continue home meds °

## 2022-07-07 NOTE — Hospital Course (Signed)
Stephanie Thornton is Stephanie Thornton 51 y.o. female with medical history significant for CAD with recent NSTEMI s/p to LCx 05/07/2022, T2DM, asthma, HTN, HLD, depression/anxiety/bipolar disorder, OSA not using CPAP who is admitted with DKA associated with type 2 diabetes with possible mild pancreatitis.   She's improved at this point with treatment.  Planning for discharge back on insulin pump.  She'll need to follow up with her PCP and endocrinologist outpatient.  See below for additional details

## 2022-07-07 NOTE — Assessment & Plan Note (Signed)
nstemi s/p DES to L circumflex 05/07/2022 Continue aspirin and brilinta at discharge (eliquis discontinued per cards)

## 2022-07-07 NOTE — ED Provider Notes (Signed)
[facility] DEPART[facility] Provider Note   CSN: [id number] Arrival date & time: 07/07/22  1840     History  Chief Complaint  Patient presents with   Hyperglycemia    Stephanie Thornton is a 51 y.o. female history of diabetes, previous STEMI, hypertension, here presenting with chest pain and abdominal pain.  Patient had a cute onset of chest pain abdominal pain today.  Patient also has some frequent urination for 2 days and nausea vomiting as well.  EMS noticed that she was tachycardic.  She states that her blood sugar was 475 today and she gave herself 10 units of insulin prior to arrival  The history is provided by the patient.       Home Medications Prior to Admission medications   Medication Sig Start Date End Date Taking? Authorizing Provider  albuterol (PROVENTIL HFA;VENTOLIN HFA) 108 (90 Base) MCG/ACT inhaler Inhale 1-2 puffs into the lungs every 6 (six) hours as needed for wheezing or shortness of breath. Patient taking differently: Inhale 2 puffs into the lungs every 6 (six) hours as needed for wheezing or shortness of breath. 03/04/19   Kerin Perna, NP  amitriptyline (ELAVIL) 150 MG tablet Take 150 mg by mouth at bedtime. 01/28/19   [provider]  amLODipine (NORVASC) 10 MG tablet Take 1 tablet (10 mg total) by mouth daily. 05/09/22   Margie Billet, NP  aspirin 81 MG chewable tablet Chew 1 tablet (81 mg total) by mouth daily. 05/09/22   Margie Billet, NP  atorvastatin (LIPITOR) 80 MG tablet Take 1 tablet (80 mg total) by mouth daily. 05/09/22   Margie Billet, NP  baclofen (LIORESAL) 10 MG tablet Take 10 mg by mouth 3 (three) times daily. 05/05/22   [provider]  busPIRone (BUSPAR) 10 MG tablet Take 10 mg by mouth 3 (three) times daily. 01/25/19   [provider]  FARXIGA 10 MG TABS tablet Take 10 mg by mouth daily. 09/19/20   [provider]  furosemide (LASIX) 20 MG tablet Take 2 tablets by mouth daily X's 3 days then  reduce to 1 tablet by mouth daily. 05/29/22   Richardson Dopp T, PA-C  gabapentin (NEURONTIN) 300 MG capsule Take 1 capsule (300 mg total) by mouth 3 (three) times daily. 06/29/19   Kerin Perna, NP  LATUDA 80 MG TABS tablet Take 80 mg by mouth every evening. 02/01/19   [provider]  metoprolol tartrate (LOPRESSOR) 25 MG tablet Take 1 tablet (25 mg total) by mouth 2 (two) times daily. 05/09/22   Margie Billet, NP  nitroGLYCERIN (NITROSTAT) 0.4 MG SL tablet Place 1 tablet (0.4 mg total) under the tongue every 5 (five) minutes x 3 doses as needed for chest pain. 05/09/22   Margie Billet, NP  NOVOLOG 100 UNIT/ML injection Inject 1 Units into the skin in the morning, at noon, in the evening, and at bedtime. 11/21/20   [provider]  olmesartan (BENICAR) 40 MG tablet Take 1 tablet (40 mg total) by mouth daily. 05/09/22 05/09/23  Margie Billet, NP  oxyCODONE (ROXICODONE) 15 MG immediate release tablet Take 15 mg by mouth 4 (four) times daily as needed for pain. Patient not taking: Reported on 05/22/2022 02/20/19   [provider]  OZEMPIC, 1 MG/DOSE, 4 MG/3ML SOPN Inject 1 mg as directed every Sunday. 12/14/20   [provider]  potassium chloride (KLOR-CON) 10 MEQ tablet Take 2 tablets by mouth daily X's 3 days then reduce to 1 tablet  by mouth daily. 05/29/22   Tereso Newcomer T, PA-C  ticagrelor (BRILINTA) 90 MG TABS tablet Take 1 tablet (90 mg total) by mouth 2 (two) times daily. 05/09/22   Cyndi Bender, NP  topiramate (TOPAMAX) 200 MG tablet Take 200 mg by mouth at bedtime. 01/28/19   [provider]  traZODone (DESYREL) 100 MG tablet Take 150 mg by mouth at bedtime. 02/24/19   [provider]      Allergies    Lyrica [pregabalin], Rocephin [ceftriaxone sodium in dextrose], Sulfa antibiotics, Ultram [tramadol], Vancomycin, Glucotrol [glipizide], Keflex [cephalexin], Penicillins, and Janumet [sitagliptin-metformin hcl]    Review of Systems   Review of Systems   Cardiovascular:  Positive for chest pain.  Gastrointestinal:  Positive for abdominal pain, nausea and vomiting.  All other systems reviewed and are negative.   Physical Exam Updated Vital Signs BP 127/90 (BP Location: Right Arm)   Pulse (!) 134   Temp 97.8 F (36.6 C) (Axillary)   Resp 15   Ht 5\' 2"  (1.575 m)   Wt 73.6 kg   SpO2 100%   BMI 29.68 kg/m  Physical Exam Vitals and nursing note reviewed.  Constitutional:      Comments: Uncomfortable and vomiting  HENT:     Head: Normocephalic.     Nose: Nose normal.     Mouth/Throat:     Mouth: Mucous membranes are dry.  Eyes:     Extraocular Movements: Extraocular movements intact.     Pupils: Pupils are equal, round, and reactive to light.  Cardiovascular:     Rate and Rhythm: Regular rhythm. Tachycardia present.     Pulses: Normal pulses.  Pulmonary:     Effort: Pulmonary effort is normal.     Breath sounds: Normal breath sounds.  Abdominal:     Comments: + Epigastric tenderness  Musculoskeletal:        General: Normal range of motion.     Cervical back: Normal range of motion and neck supple.  Skin:    General: Skin is warm.     Capillary Refill: Capillary refill takes less than 2 seconds.  Neurological:     General: No focal deficit present.     Mental Status: She is oriented to person, place, and time.  Psychiatric:        Mood and Affect: Mood normal.        Behavior: Behavior normal.     ED Results / Procedures / Treatments   Labs (all labs ordered are listed, but only abnormal results are displayed) Labs Reviewed  BASIC METABOLIC PANEL - Abnormal; Notable for the following components:      Result Value   Potassium 2.8 (*)    CO2 19 (*)    Glucose, Bld 346 (*)    Creatinine, Ser 1.57 (*)    Calcium 11.1 (*)    GFR, Estimated 40 (*)    Anion gap 23 (*)    All other components within normal limits  BETA-HYDROXYBUTYRIC ACID - Abnormal; Notable for the following components:   Beta-Hydroxybutyric Acid  1.40 (*)    All other components within normal limits  CBC WITH DIFFERENTIAL/PLATELET - Abnormal; Notable for the following components:   WBC 15.7 (*)    RBC 6.09 (*)    Hemoglobin 16.2 (*)    HCT 49.1 (*)    Neutro Abs 12.2 (*)    All other components within normal limits  LIPASE, BLOOD - Abnormal; Notable for the following components:   Lipase 148 (*)  All other components within normal limits  SALICYLATE LEVEL - Abnormal; Notable for the following components:   Salicylate Lvl [id number] (*)    All other components within normal limits  ACETAMINOPHEN LEVEL - Abnormal; Notable for the following components:   Acetaminophen (Tylenol), Serum <10 (*)    All other components within normal limits  CBG MONITORING, ED - Abnormal; Notable for the following components:   Glucose-Capillary 352 (*)    All other components within normal limits  I-STAT VENOUS BLOOD GAS, ED - Abnormal; Notable for the following components:   pH, Ven 7.646 (*)    pCO2, Ven 19.2 (*)    pO2, Ven 27 (*)    Acid-Base Excess 3.0 (*)    Sodium 146 (*)    Potassium 2.6 (*)    HCT 52.0 (*)    Hemoglobin 17.7 (*)    All other components within normal limits  TROPONIN I (HIGH SENSITIVITY) - Abnormal; Notable for the following components:   Troponin I (High Sensitivity) 31 (*)    All other components within normal limits  ETHANOL  MAGNESIUM  BASIC METABOLIC PANEL  BASIC METABOLIC PANEL  BASIC METABOLIC PANEL  BETA-HYDROXYBUTYRIC ACID  URINALYSIS, ROUTINE W REFLEX MICROSCOPIC  I-STAT BETA HCG BLOOD, ED ([facility], [facility], [facility] ONLY)  I-STAT CHEM 8, ED  TROPONIN I (HIGH SENSITIVITY)    EKG None  Radiology DG Chest Port 1 View  Result Date: 07/07/2022 CLINICAL DATA:  Chest pain EXAM: PORTABLE CHEST 1 VIEW COMPARISON:  05/06/2022 FINDINGS: The heart size and mediastinal contours are within normal limits. Both lungs are clear. The visualized skeletal structures are unremarkable. IMPRESSION: No active disease. Electronically  Signed   By: Donavan Foil M.D.   On: 07/07/2022 19:24    Procedures Procedures    CRITICAL CARE Performed by: Wandra Arthurs   Total critical care time: 30 minutes  Critical care time was exclusive of separately billable procedures and treating other patients.  Critical care was necessary to treat or prevent imminent or life-threatening deterioration.  Critical care was time spent personally by me on the following activities: development of treatment plan with patient and/or surrogate as well as nursing, discussions with consultants, evaluation of patient's response to treatment, examination of patient, obtaining history from patient or surrogate, ordering and performing treatments and interventions, ordering and review of laboratory studies, ordering and review of radiographic studies, pulse oximetry and re-evaluation of patient's condition.   Medications Ordered in ED Medications  potassium chloride 10 mEq in 100 mL IVPB (10 mEq Intravenous New Bag/Given 07/07/22 2018)  insulin regular, human (MYXREDLIN) 100 units/ 100 mL infusion (has no administration in time range)  lactated ringers infusion (has no administration in time range)  dextrose 5 % in lactated ringers infusion (has no administration in time range)  dextrose 50 % solution 0-50 mL (has no administration in time range)  potassium chloride 10 mEq in 100 mL IVPB (has no administration in time range)  lactated ringers bolus 1,000 mL (1,000 mLs Intravenous New Bag/Given 07/07/22 2016)  ondansetron (ZOFRAN) injection 4 mg (4 mg Intravenous Given 07/07/22 1857)  morphine (PF) 4 MG/ML injection 4 mg (4 mg Intravenous Given 07/07/22 1857)    ED Course/ Medical Decision Making/ A&P                           Medical Decision Making Casi Nataysha Buffkin is a 51 y.o. female here presenting with chest pain abdominal pain and  hyperglycemia.  Concern for possible DKA versus dissection versus STEMI versus pancreatitis.  Plan to get CBC and CMP  and lipase and troponin and VBG and CT dissection study.  We will give IV fluids and likely will need to start IV insulin if she has anion gap.  10:14 PM I reviewed patient's labs and independently interpreted imaging studies.  Patient's dissection study showed esophagitis.  Patient's labs showed alkalosis with pH of 7.6 with a bicarb of 19.  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  Her anion gap is 23.  She also has elevated beta hydroxybutyric acid.  Patient was started on insulin drip.  Patient also has potassium of 2.6 that was supplemented.  At this point hospitalist to admit for DKA, contraction alkalosis, esophagitis    Problems Addressed: Acute pancreatitis, unspecified complication status, unspecified pancreatitis type: acute illness or injury Diabetic ketoacidosis without coma associated with type 2 diabetes mellitus (HCC): acute illness or injury Esophagitis: acute illness or injury  Amount and/or Complexity of Data Reviewed Labs: ordered. Decision-making details documented in ED Course. Radiology: ordered and independent interpretation performed. Decision-making details documented in ED Course.  Risk Prescription drug management. Decision regarding hospitalization.    Final Clinical Impression(s) / ED Diagnoses Final diagnoses:  None    Rx / DC Orders ED Discharge Orders     None         Charlynne Pander, MD 07/07/22 2216

## 2022-07-07 NOTE — Assessment & Plan Note (Signed)
Has not been using CPAP.  Use supplemental oxygen as needed.

## 2022-07-07 NOTE — Assessment & Plan Note (Signed)
Resume statin when tolerating oral.

## 2022-07-07 NOTE — Assessment & Plan Note (Signed)
1.1 cm incidental thyroid nodule with heterogeneous enlarged thyroid seen on CT imaging.  Has had suppressed TSH  with normal free T3 and T4 two months ago. -Follow-up thyroid ultrasound -Would hold home Ozempic pending further evaluation of thyroid nodule

## 2022-07-07 NOTE — ED Triage Notes (Signed)
Coming from home. Nausea, vomiting, chest pain, frequent urination x 2 days. Hyperglycemia with CBG of 347. Alert and oriented but slow to respond.

## 2022-07-07 NOTE — Assessment & Plan Note (Signed)
Secondary to volume depletion in setting of DKA and acute pancreatitis.  Improving with IV fluids.

## 2022-07-07 NOTE — Assessment & Plan Note (Signed)
Mild esophageal wall thickening concerning for esophagitis protonix daily GI referral outpatient

## 2022-07-07 NOTE — ED Notes (Signed)
Dr.Yao shown results of VBG ED-Lab 

## 2022-07-08 ENCOUNTER — Encounter (HOSPITAL_COMMUNITY): Payer: Self-pay | Admitting: Internal Medicine

## 2022-07-08 ENCOUNTER — Other Ambulatory Visit: Payer: Self-pay

## 2022-07-08 ENCOUNTER — Other Ambulatory Visit (HOSPITAL_COMMUNITY): Payer: Self-pay

## 2022-07-08 ENCOUNTER — Inpatient Hospital Stay (HOSPITAL_COMMUNITY): Payer: Medicare HMO

## 2022-07-08 DIAGNOSIS — E1110 Type 2 diabetes mellitus with ketoacidosis without coma: Secondary | ICD-10-CM | POA: Diagnosis not present

## 2022-07-08 LAB — BETA-HYDROXYBUTYRIC ACID: Beta-Hydroxybutyric Acid: 0.7 mmol/L — ABNORMAL HIGH (ref 0.05–0.27)

## 2022-07-08 LAB — BASIC METABOLIC PANEL
Anion gap: 13 (ref 5–15)
Anion gap: 10 (ref 5–15)
BUN: 16 mg/dL (ref 6–20)
BUN: 15 mg/dL (ref 6–20)
CO2: 27 mmol/L (ref 22–32)
CO2: 24 mmol/L (ref 22–32)
Calcium: 9.7 mg/dL (ref 8.9–10.3)
Calcium: 8.8 mg/dL — ABNORMAL LOW (ref 8.9–10.3)
Chloride: 107 mmol/L (ref 98–111)
Chloride: 109 mmol/L (ref 98–111)
Creatinine, Ser: 0.78 mg/dL (ref 0.44–1.00)
Creatinine, Ser: 0.77 mg/dL (ref 0.44–1.00)
GFR, Estimated: >60 mL/min (ref >60)
GFR, Estimated: >60 mL/min (ref >60)
Glucose, Bld: 132 mg/dL — ABNORMAL HIGH (ref 70–99)
Glucose, Bld: 196 mg/dL — ABNORMAL HIGH (ref 70–99)
Potassium: 2.9 mmol/L — ABNORMAL LOW (ref 3.5–5.1)
Potassium: 3.7 mmol/L (ref 3.5–5.1)
Sodium: 147 mmol/L — ABNORMAL HIGH (ref 135–145)
Sodium: 143 mmol/L (ref 135–145)

## 2022-07-08 LAB — TRIGLYCERIDES: Triglycerides: 68 mg/dL (ref <150)

## 2022-07-08 LAB — MRSA NEXT GEN BY PCR, NASAL: MRSA by PCR Next Gen: NOT DETECTED

## 2022-07-08 LAB — CBG MONITORING, ED
Glucose-Capillary: 107 mg/dL — ABNORMAL HIGH (ref 70–99)
Glucose-Capillary: 150 mg/dL — ABNORMAL HIGH (ref 70–99)
Glucose-Capillary: 152 mg/dL — ABNORMAL HIGH (ref 70–99)
Glucose-Capillary: 174 mg/dL — ABNORMAL HIGH (ref 70–99)
Glucose-Capillary: 208 mg/dL — ABNORMAL HIGH (ref 70–99)
Glucose-Capillary: 46 mg/dL — ABNORMAL LOW (ref 70–99)
Glucose-Capillary: 66 mg/dL — ABNORMAL LOW (ref 70–99)
Glucose-Capillary: 75 mg/dL (ref 70–99)
Glucose-Capillary: 76 mg/dL (ref 70–99)
Glucose-Capillary: 76 mg/dL (ref 70–99)

## 2022-07-08 LAB — URINALYSIS, ROUTINE W REFLEX MICROSCOPIC
Bacteria, UA: NONE SEEN
Bilirubin Urine: NEGATIVE
Glucose, UA: >=500 mg/dL — AB
Hgb urine dipstick: NEGATIVE
Ketones, ur: NEGATIVE mg/dL
Leukocytes,Ua: NEGATIVE
Nitrite: NEGATIVE
Protein, ur: 30 mg/dL — AB
Specific Gravity, Urine: >1.046 — ABNORMAL HIGH (ref 1.005–1.030)
pH: 5 (ref 5.0–8.0)

## 2022-07-08 LAB — CBC
HCT: 42.4 % (ref 36.0–46.0)
Hemoglobin: 13.9 g/dL (ref 12.0–15.0)
MCH: 26.7 pg (ref 26.0–34.0)
MCHC: 32.8 g/dL (ref 30.0–36.0)
MCV: 81.4 fL (ref 80.0–100.0)
Platelets: 245 10*3/uL (ref 150–400)
RBC: 5.21 MIL/uL — ABNORMAL HIGH (ref 3.87–5.11)
RDW: 14.6 % (ref 11.5–15.5)
WBC: 17.4 10*3/uL — ABNORMAL HIGH (ref 4.0–10.5)
nRBC: 0 % (ref 0.0–0.2)

## 2022-07-08 LAB — GLUCOSE, CAPILLARY
Glucose-Capillary: 150 mg/dL — ABNORMAL HIGH (ref 70–99)
Glucose-Capillary: 321 mg/dL — ABNORMAL HIGH (ref 70–99)

## 2022-07-08 MED ORDER — AMITRIPTYLINE HCL 50 MG PO TABS
150.0000 mg | ORAL_TABLET | Freq: Every day | ORAL | Status: DC
  Administered 2022-07-08 – 2022-07-09 (×2): 150 mg via ORAL
  Filled 2022-07-08 (×2): qty 3
  Filled 2022-07-08: qty 2

## 2022-07-08 MED ORDER — BUSPIRONE HCL 10 MG PO TABS
10.0000 mg | ORAL_TABLET | Freq: Three times a day (TID) | ORAL | Status: DC
  Administered 2022-07-08 – 2022-07-10 (×6): 10 mg via ORAL
  Filled 2022-07-08 (×6): qty 1

## 2022-07-08 MED ORDER — AMLODIPINE BESYLATE 10 MG PO TABS
10.0000 mg | ORAL_TABLET | Freq: Every day | ORAL | Status: DC
  Administered 2022-07-08 – 2022-07-09 (×2): 10 mg via ORAL
  Filled 2022-07-08: qty 2
  Filled 2022-07-08: qty 1

## 2022-07-08 MED ORDER — DEXTROSE 50 % IV SOLN: 25.0000 mL | Freq: Once | INTRAVENOUS | Status: DC

## 2022-07-08 MED ORDER — LURASIDONE HCL 40 MG PO TABS
80.0000 mg | ORAL_TABLET | Freq: Every evening | ORAL | Status: DC
  Administered 2022-07-08 – 2022-07-09 (×2): 80 mg via ORAL
  Filled 2022-07-08 (×3): qty 2

## 2022-07-08 MED ORDER — POTASSIUM CHLORIDE 2 MEQ/ML IV SOLN
INTRAVENOUS | Status: DC
  Filled 2022-07-08 (×4): qty 1000

## 2022-07-08 MED ORDER — DEXTROSE 50 % IV SOLN
1.0000 | Freq: Once | INTRAVENOUS | Status: AC
  Administered 2022-07-08: 50 mL via INTRAVENOUS
  Filled 2022-07-08: qty 50

## 2022-07-08 MED ORDER — TOPIRAMATE 100 MG PO TABS
200.0000 mg | ORAL_TABLET | Freq: Every day | ORAL | Status: DC
  Administered 2022-07-08 – 2022-07-09 (×2): 200 mg via ORAL
  Filled 2022-07-08 (×3): qty 2

## 2022-07-08 MED ORDER — TRAZODONE HCL 50 MG PO TABS
150.0000 mg | ORAL_TABLET | Freq: Every day | ORAL | Status: DC
  Administered 2022-07-08 – 2022-07-09 (×2): 150 mg via ORAL
  Filled 2022-07-08 (×2): qty 3

## 2022-07-08 MED ORDER — INSULIN ASPART 100 UNIT/ML IJ SOLN
0.0000 [IU] | Freq: Three times a day (TID) | INTRAMUSCULAR | Status: DC
  Administered 2022-07-08: 1 [IU] via SUBCUTANEOUS
  Administered 2022-07-09: 3 [IU] via SUBCUTANEOUS
  Administered 2022-07-09: 8 [IU] via SUBCUTANEOUS
  Administered 2022-07-09 – 2022-07-10 (×2): 5 [IU] via SUBCUTANEOUS
  Administered 2022-07-10: 8 [IU] via SUBCUTANEOUS

## 2022-07-08 MED ORDER — ROSUVASTATIN CALCIUM 20 MG PO TABS
20.0000 mg | ORAL_TABLET | Freq: Every day | ORAL | Status: DC
  Administered 2022-07-08 – 2022-07-10 (×3): 20 mg via ORAL
  Filled 2022-07-08 (×3): qty 1

## 2022-07-08 MED ORDER — LACTATED RINGERS IV SOLN
INTRAVENOUS | Status: DC
Start: 2022-07-08 — End: 2022-07-08

## 2022-07-08 MED ORDER — INSULIN ASPART 100 UNIT/ML IJ SOLN
0.0000 [IU] | Freq: Every day | INTRAMUSCULAR | Status: DC
  Administered 2022-07-08 – 2022-07-09 (×2): 4 [IU] via SUBCUTANEOUS

## 2022-07-08 MED ORDER — ACETAMINOPHEN 325 MG PO TABS
650.0000 mg | ORAL_TABLET | Freq: Four times a day (QID) | ORAL | Status: DC | PRN
  Administered 2022-07-08 – 2022-07-09 (×3): 650 mg via ORAL
  Filled 2022-07-08 (×3): qty 2

## 2022-07-08 MED ORDER — POTASSIUM CHLORIDE 2 MEQ/ML IV SOLN
INTRAVENOUS | Status: DC
  Filled 2022-07-08: qty 1000

## 2022-07-08 MED ORDER — GABAPENTIN 300 MG PO CAPS
300.0000 mg | ORAL_CAPSULE | Freq: Three times a day (TID) | ORAL | Status: DC
  Administered 2022-07-08 – 2022-07-10 (×6): 300 mg via ORAL
  Filled 2022-07-08 (×6): qty 1

## 2022-07-08 MED ORDER — DEXTROSE 50 % IV SOLN
25.0000 mL | INTRAVENOUS | Status: DC | PRN
Start: 2022-07-08 — End: 2022-07-10
  Administered 2022-07-08: 25 mL via INTRAVENOUS
  Filled 2022-07-08: qty 50

## 2022-07-08 MED ORDER — METOPROLOL TARTRATE 25 MG PO TABS
25.0000 mg | ORAL_TABLET | Freq: Two times a day (BID) | ORAL | Status: DC
  Administered 2022-07-08 – 2022-07-10 (×5): 25 mg via ORAL
  Filled 2022-07-08 (×5): qty 1

## 2022-07-08 MED ORDER — DEXTROSE IN LACTATED RINGERS 5 % IV SOLN: INTRAVENOUS | Status: DC

## 2022-07-08 NOTE — ED Notes (Signed)
Dr. Loney Loh, MD notified of pt's CBG. New orders given and this RN will continue to assess for therapeutic effects.

## 2022-07-08 NOTE — Inpatient Diabetes Management (Addendum)
Inpatient Diabetes Program Recommendations  AACE/ADA: New Consensus Statement on Inpatient Glycemic Control (2015)  Target Ranges:  Prepandial:   less than 140 mg/dL      Peak postprandial:   less than 180 mg/dL (1-2 hours)      Critically ill patients:  140 - 180 mg/dL   Lab Results  Component Value Date   GLUCAP 76 07/08/2022   HGBA1C 9.4 (H) 05/07/2022    Review of Glycemic Control  Diabetes history: DM 2 Outpatient Diabetes medications: Farxiga 10 mg Daily, Ozempic 1 mg weekly, Novolog, Insulin pump? Current orders for Inpatient glycemic control:  Novolog 0-15 units tid + hs  A1c 9.4% on 5/9  Endocrinologist: ?Current Dr. Crista Curb in the past last visit 08/19/2018 Dr. Gayla Medicus recent past just left practice  Note pt on Farxiga outpt which could have precipitated DKA.  Inpatient Diabetes Program Recommendations:    -  When glucose trends are >180 consistently start basal insulin. Pt is on insulin pump at home.  -  d/c Farxiga at d/c and have pt follow up with prescriber.  Spoke with pt at bedside. Pt sees Endocrinology with [facility]. Pt uses an Omnipod insulin pump with a Freestyle Libre for glucose monitoring. Pt reports glucose trends have been in the mid 200 range at home. PT was recently started on Farxiga about 1.5 months ago by her PCP. Discussed SGLT2 can precipitate DKA in patients. Explained to pt that we may d/c the medication and have her follow up with her doctor. Discussed her current glycemic control. Encouraged pt to contact MD for pump setting adjustments for better glycemic control.  Thanks,  Christena Deem RN, MSN, BC-ADM Inpatient Diabetes Coordinator Team Pager [phone] (8a-5p)

## 2022-07-08 NOTE — ED Notes (Signed)
Pt's CBG was 46mg /dL. Juice administered per hypoglycemia protocol and pt tolerated well. D/t close monitoring, attending provider notified of the same and verbal orders to give 1 ampule D50 provided. Will administer and monitor for therapeutic effects.

## 2022-07-08 NOTE — Progress Notes (Addendum)
PROGRESS NOTE    Saxon Crosby  IOE:[id number] DOB: May 31, 1971 DOA: 07/07/2022 PCP: Paschal Dopp, PA     Brief Narrative:  From admission h and p Mikki Harbor is a 51 y.o. female with medical history significant for CAD with recent NSTEMI s/p to LCx 05/07/2022, T2DM, asthma, HTN, HLD, depression/anxiety/bipolar disorder, OSA not using CPAP who presented to the ED for evaluation of nausea, vomiting, urinary frequency.  Patient reports new onset of frequent nausea, vomiting, epigastric pain over the last 2 days.  She has not been able to maintain any adequate oral intake.  She has associated increased urinary frequency without dysuria.  She reports significant heartburn/reflux symptoms.  She has had decreased bowel movements.  She has had some intermittent chest discomfort.  She is feeling dehydrated and fatigued.   She notes that CBGs have been running high at home up to 475 today.  She gave herself 10 units of insulin prior to arrival.  She says she normally runs in 130-150 range.  She says she uses an insulin pump at home but does not have it with her now.     Assessment & Plan:   Principal Problem:   Diabetic ketoacidosis without coma associated with type 2 diabetes mellitus (HCC) Active Problems:   Acute pancreatitis without infection or necrosis   AKI (acute kidney injury) (HCC)   Hypokalemia   Esophagitis   Hypercalcemia   Depression   Hypertension associated with diabetes (HCC)   Hyperlipidemia associated with type 2 diabetes mellitus (HCC)   Coronary artery disease   Thyroid nodule   Leukocytosis   OSA (obstructive sleep apnea)   Diabetic ketoacidosis without coma associated with type 2 diabetes mellitus (HCC) Elevated gap with low bicarb on admission. Dka appears resolved this morning. No apparent infection. Recently discontinued her insulin pump, so this transition may be the culprit. Would also plan on holding [id number] at d/c as can precipitate dka. - is now  off insulin gtt - dm educator consult - SSI - continue fluids - basal insulin likely later today   Elevated lipase Presenting with nausea, vomiting, epigastric pain, lipase 148. No signs pancreatitis on imaging. Lipase elevation quite possibly 2/2 dka. Ozempic also potentially contributory. Abd pain improved this morning - cont ivf - f/u triglycerides  AKI (acute kidney injury) (HCC) Resolved with fluids  HTN Here mild bp elevation - resume home amlodipine  Weakness Non-focal. Also cites muscle soreness - pt consult - f/u ck   Hypokalemia - add iv kcl to iv vluids. Mg is wnl   OSA (obstructive sleep apnea) Has not been using CPAP.  Use supplemental oxygen as needed.   Thyroid nodule 1.1 cm incidental thyroid nodule with heterogeneous enlarged thyroid seen on CT imaging.  Has had suppressed TSH  with normal free T3 and T4 two months ago. -Follow-up thyroid ultrasound   Coronary artery disease Recent NSTEMI s/p DES to LCx 05/07/2022.  Troponin minimally elevated at 31 and relatively flat on repeat.  EKG shows sinus tachycardia. -Resume aspirin and Brilinta  and statin and metop - holding home lasix given dehydration from dka   Depression Anxiety/bipolar disorder - resume home topamax, lurasidone, buspirone  Insomnia - home trazodone, amitryptyline   DVT prophylaxis: lovenox Code Status: full Family Communication: brother updated @ bedside  Level of care: Progressive Status is: Inpatient Remains inpatient appropriate because: severity of illness    Consultants:  none  Procedures: none  Antimicrobials:  none   Subjective: Still feels weak.  Abdominal pain improved. No vomiting overnight.   Objective: Vitals:   07/08/22 0030 07/08/22 0330 07/08/22 0530 07/08/22 0715  BP: 121/66 (!) 143/81 (!) 146/94 (!) 146/86  Pulse: (!) 110 (!) 116 (!) 110 (!) 113  Resp: 18 17 14 16   Temp:      TempSrc:      SpO2: 94% 91% 100% 99%  Weight:      Height:        No intake or output data in the 24 hours ending 07/08/22 0814 Filed Weights   07/07/22 1844  Weight: 73.6 kg    Examination:  General exam: Appears calm and comfortable  Respiratory system: Clear to auscultation. Respiratory effort normal. Cardiovascular system: S1 & S2 heard, RRR. No JVD, murmurs, rubs, gallops or clicks. No pedal edema. Gastrointestinal system: Abdomen is nondistended, soft and mildly tender throughout. No organomegaly or masses felt. Normal bowel sounds heard. Central nervous system: Alert and oriented. No focal neurological deficits. Extremities: Symmetric 5 x 5 power. Skin: No rashes, lesions or ulcers Psychiatry: Judgement and insight appear normal. Mood & affect appropriate.     Data Reviewed: I have personally reviewed following labs and imaging studies  CBC: Recent Labs  Lab 07/07/22 1843 07/07/22 1901 07/08/22 0423  WBC 15.7*  --  17.4*  NEUTROABS 12.2*  --   --   HGB 16.2* 17.7* 13.9  HCT 49.1* 52.0* 42.4  MCV 80.6  --  81.4  PLT 367  --  245   Basic Metabolic Panel: Recent Labs  Lab 07/07/22 1843 07/07/22 1901 07/07/22 1905 07/07/22 2155 07/08/22 0423  NA 144 146*  --  146* 147*  K 2.8* 2.6*  --  3.7 2.9*  CL 102  --   --  109 107  CO2 19*  --   --  26 27  GLUCOSE 346*  --   --  141* 132*  BUN 20  --   --  18 16  CREATININE 1.57*  --   --  1.07* 0.78  CALCIUM 11.1*  --   --  9.9 9.7  MG  --   --  2.4  --   --    GFR: Estimated Creatinine Clearance: 79 mL/min (by C-G formula based on SCr of 0.78 mg/dL). Liver Function Tests: No results for input(s): "AST", "ALT", "ALKPHOS", "BILITOT", "PROT", "ALBUMIN" in the last 168 hours. Recent Labs  Lab 07/07/22 1843  LIPASE 148*   No results for input(s): "AMMONIA" in the last 168 hours. Coagulation Profile: No results for input(s): "INR", "PROTIME" in the last 168 hours. Cardiac Enzymes: No results for input(s): "CKTOTAL", "CKMB", "CKMBINDEX", "TROPONINI" in the last 168  hours. BNP (last 3 results) Recent Labs    05/22/22 1006 06/05/22 0831  PROBNP 951* 726*   HbA1C: No results for input(s): "HGBA1C" in the last 72 hours. CBG: Recent Labs  Lab 07/08/22 0047 07/08/22 0252 07/08/22 0409 07/08/22 0615 07/08/22 0710  GLUCAP 208* 66* 152* 75 76   Lipid Profile: No results for input(s): "CHOL", "HDL", "LDLCALC", "TRIG", "CHOLHDL", "LDLDIRECT" in the last 72 hours. Thyroid Function Tests: No results for input(s): "TSH", "T4TOTAL", "FREET4", "T3FREE", "THYROIDAB" in the last 72 hours. Anemia Panel: No results for input(s): "VITAMINB12", "FOLATE", "FERRITIN", "TIBC", "IRON", "RETICCTPCT" in the last 72 hours. Urine analysis:    Component Value Date/Time   COLORURINE YELLOW 07/08/2022 0430   APPEARANCEUR HAZY (A) 07/08/2022 0430   LABSPEC >1.046 (H) 07/08/2022 0430   PHURINE 5.0 07/08/2022 0430   GLUCOSEU >=  500 (A) 07/08/2022 0430   HGBUR NEGATIVE 07/08/2022 0430   BILIRUBINUR NEGATIVE 07/08/2022 0430   BILIRUBINUR negative 03/04/2019 0950   BILIRUBINUR neg 02/13/2018 1219   KETONESUR NEGATIVE 07/08/2022 0430   PROTEINUR 30 (A) 07/08/2022 0430   UROBILINOGEN 0.2 12/05/2020 1151   NITRITE NEGATIVE 07/08/2022 0430   LEUKOCYTESUR NEGATIVE 07/08/2022 0430   Sepsis Labs: @LABRCNTIP (procalcitonin:4,lacticidven:4)  )No results found for this or any previous visit (from the past 240 hour(s)).       Radiology Studies: CT Angio Chest/Abd/Pel for Dissection W and/or Wo Contrast  Result Date: 07/07/2022 CLINICAL DATA:  Acute aortic syndrome suspected. EXAM: CT ANGIOGRAPHY CHEST, ABDOMEN AND PELVIS TECHNIQUE: CT abdomen and pelvis 12/16/2020 Multidetector CT imaging through the chest, abdomen and pelvis was performed using the standard protocol during bolus administration of intravenous contrast. Multiplanar reconstructed images and MIPs were obtained and reviewed to evaluate the vascular anatomy. RADIATION DOSE REDUCTION: This exam was performed  according to the departmental dose-optimization program which includes automated exposure control, adjustment of the mA and/or kV according to patient size and/or use of iterative reconstruction technique. CONTRAST:  70mL OMNIPAQUE IOHEXOL 350 MG/ML SOLN COMPARISON:  None Available. FINDINGS: CTA CHEST FINDINGS Cardiovascular: Preferential opacification of the thoracic aorta. No evidence of thoracic aortic aneurysm or dissection. Normal heart size. No pericardial effusion. Mediastinum/Nodes: Thyroid gland is heterogeneous, enlarged, and contains multiple nodules measuring up to 11 mm. There are no enlarged mediastinal or hilar lymph nodes identified. Esophagus is nondilated. There is mild diffuse esophageal wall thickening. Lungs/Pleura: There is dependent atelectasis in the bilateral lower lobes. The lungs are otherwise clear. There is no pleural effusion or pneumothorax. Musculoskeletal: No chest wall abnormality. No acute osseous findings. There is a rounded sclerotic density measuring 12 mm in the T6 vertebral body. Review of the MIP images confirms the above findings. CTA ABDOMEN AND PELVIS FINDINGS VASCULAR Aorta: Normal caliber aorta without aneurysm, dissection, vasculitis or significant stenosis. Mild calcified atherosclerotic disease present. Celiac: Patent without evidence of aneurysm, dissection, vasculitis or significant stenosis. SMA: Patent without evidence of aneurysm, dissection, vasculitis or significant stenosis. Renals: Both renal arteries are patent without evidence of aneurysm, dissection, vasculitis, fibromuscular dysplasia or significant stenosis. IMA: Patent without evidence of aneurysm, dissection, vasculitis or significant stenosis. Inflow: Patent without evidence of aneurysm, dissection, vasculitis or significant stenosis. Mild calcified atherosclerotic disease. Veins: No obvious venous abnormality within the limitations of this arterial phase study. Review of the MIP images confirms the  above findings. NON-VASCULAR Hepatobiliary: No focal liver abnormality is seen. No gallstones, gallbladder wall thickening, or biliary dilatation. Pancreas: Unremarkable. No pancreatic ductal dilatation or surrounding inflammatory changes. Spleen: Rounded hypodensity in the spleen measures 2.2 cm and has increased in size. Spleen is nonenlarged. Adrenals/Urinary Tract: There is a 4 mm nonobstructing calculus in the superior pole the left kidney, unchanged. No hydronephrosis or perinephric fat stranding. Mild renal cortical scarring seen bilaterally. Adrenal glands are within normal limits. The bladder is distended, but otherwise within normal limits. Stomach/Bowel: Stomach is within normal limits. Appendix appears normal. No evidence of bowel wall thickening, distention, or inflammatory changes. There is a large amount of stool throughout the colon. Lymphatic: No enlarged lymph nodes are seen. Reproductive: Status post hysterectomy. No adnexal masses. Other: No ascites.  Small fat containing umbilical hernia. Musculoskeletal: There are few scattered sclerotic foci within the pelvis measuring up to 1 cm. These appear unchanged from prior. No acute fractures. Review of the MIP images confirms the above findings. IMPRESSION: 1. No  evidence for aortic dissection or aneurysm. 2. Mild esophageal wall thickening worrisome for esophagitis. 3. 1.1 cm incidental thyroid nodule with heterogeneous and enlarged thyroid. Recommend thyroid US. Reference: J Am Coll Radiol. 2015 Feb;12(2): 143-50 4. Nonobstructing left renal calculus. 5.  Aortic Atherosclerosis (ICD10-I70.0). Electronically Signed   By: Darliss Cheney M.D.   On: 07/07/2022 21:36   DG Chest Port 1 View  Result Date: 07/07/2022 CLINICAL DATA:  Chest pain EXAM: PORTABLE CHEST 1 VIEW COMPARISON:  05/06/2022 FINDINGS: The heart size and mediastinal contours are within normal limits. Both lungs are clear. The visualized skeletal structures are unremarkable. IMPRESSION:  No active disease. Electronically Signed   By: Jasmine Pang M.D.   On: 07/07/2022 19:24        Scheduled Meds:  aspirin  81 mg Oral Daily   enoxaparin (LOVENOX) injection  40 mg Subcutaneous Q24H   insulin aspart  0-9 Units Subcutaneous Q4H   pantoprazole (PROTONIX) IV  40 mg Intravenous Q12H   ticagrelor  90 mg Oral BID   Continuous Infusions:  lactated ringers 125 mL/hr at 07/08/22 0424     LOS: 1 day     Silvano Bilis, MD [facility]   If 7PM-7AM, please contact night-coverage [email or web address] Password [email or web address] 07/08/2022, 8:14 AM

## 2022-07-08 NOTE — ED Notes (Signed)
Endotool used for patient and recommendation is to start insulin infusion at 0.6units/hr. However, this RN is concerned d/t continual drop in CBG and current reading of 89mg /dL. Attending provider consulted and notified this RN to discontinue usage of Endotool at this time. RN will continue to monitor pt for any changes and monitor CBG readings.

## 2022-07-09 DIAGNOSIS — Z7901 Long term (current) use of anticoagulants: Secondary | ICD-10-CM

## 2022-07-09 DIAGNOSIS — F3112 Bipolar disorder, current episode manic without psychotic features, moderate: Secondary | ICD-10-CM

## 2022-07-09 DIAGNOSIS — E1169 Type 2 diabetes mellitus with other specified complication: Secondary | ICD-10-CM

## 2022-07-09 DIAGNOSIS — E785 Hyperlipidemia, unspecified: Secondary | ICD-10-CM

## 2022-07-09 DIAGNOSIS — E041 Nontoxic single thyroid nodule: Secondary | ICD-10-CM

## 2022-07-09 DIAGNOSIS — E1159 Type 2 diabetes mellitus with other circulatory complications: Secondary | ICD-10-CM

## 2022-07-09 DIAGNOSIS — N179 Acute kidney failure, unspecified: Secondary | ICD-10-CM

## 2022-07-09 DIAGNOSIS — E1110 Type 2 diabetes mellitus with ketoacidosis without coma: Secondary | ICD-10-CM | POA: Diagnosis not present

## 2022-07-09 DIAGNOSIS — Z5181 Encounter for therapeutic drug level monitoring: Secondary | ICD-10-CM | POA: Diagnosis not present

## 2022-07-09 DIAGNOSIS — G4733 Obstructive sleep apnea (adult) (pediatric): Secondary | ICD-10-CM

## 2022-07-09 DIAGNOSIS — I2510 Atherosclerotic heart disease of native coronary artery without angina pectoris: Secondary | ICD-10-CM

## 2022-07-09 DIAGNOSIS — I152 Hypertension secondary to endocrine disorders: Secondary | ICD-10-CM

## 2022-07-09 LAB — CK: Total CK: 64 U/L (ref 38–234)

## 2022-07-09 LAB — BASIC METABOLIC PANEL
Anion gap: 12 (ref 5–15)
Anion gap: 14 (ref 5–15)
Anion gap: 6 (ref 5–15)
Anion gap: 10 (ref 5–15)
BUN: 12 mg/dL (ref 6–20)
BUN: 14 mg/dL (ref 6–20)
BUN: 14 mg/dL (ref 6–20)
BUN: 12 mg/dL (ref 6–20)
CO2: 21 mmol/L — ABNORMAL LOW (ref 22–32)
CO2: 16 mmol/L — ABNORMAL LOW (ref 22–32)
CO2: 20 mmol/L — ABNORMAL LOW (ref 22–32)
CO2: 19 mmol/L — ABNORMAL LOW (ref 22–32)
Calcium: 9.5 mg/dL (ref 8.9–10.3)
Calcium: 9.1 mg/dL (ref 8.9–10.3)
Calcium: 9.1 mg/dL (ref 8.9–10.3)
Calcium: 8.7 mg/dL — ABNORMAL LOW (ref 8.9–10.3)
Chloride: 107 mmol/L (ref 98–111)
Chloride: 113 mmol/L — ABNORMAL HIGH (ref 98–111)
Chloride: 111 mmol/L (ref 98–111)
Chloride: 108 mmol/L (ref 98–111)
Creatinine, Ser: 0.85 mg/dL (ref 0.44–1.00)
Creatinine, Ser: 1 mg/dL (ref 0.44–1.00)
Creatinine, Ser: 0.87 mg/dL (ref 0.44–1.00)
Creatinine, Ser: 1.01 mg/dL — ABNORMAL HIGH (ref 0.44–1.00)
GFR, Estimated: >60 mL/min (ref >60)
GFR, Estimated: >60 mL/min (ref >60)
GFR, Estimated: >60 mL/min (ref >60)
GFR, Estimated: >60 mL/min (ref >60)
Glucose, Bld: 218 mg/dL — ABNORMAL HIGH (ref 70–99)
Glucose, Bld: 194 mg/dL — ABNORMAL HIGH (ref 70–99)
Glucose, Bld: 190 mg/dL — ABNORMAL HIGH (ref 70–99)
Glucose, Bld: 441 mg/dL — ABNORMAL HIGH (ref 70–99)
Potassium: 4.5 mmol/L (ref 3.5–5.1)
Potassium: 5.8 mmol/L — ABNORMAL HIGH (ref 3.5–5.1)
Potassium: 3.8 mmol/L (ref 3.5–5.1)
Potassium: 3.9 mmol/L (ref 3.5–5.1)
Sodium: 140 mmol/L (ref 135–145)
Sodium: 143 mmol/L (ref 135–145)
Sodium: 137 mmol/L (ref 135–145)
Sodium: 137 mmol/L (ref 135–145)

## 2022-07-09 LAB — GLUCOSE, CAPILLARY
Glucose-Capillary: 192 mg/dL — ABNORMAL HIGH (ref 70–99)
Glucose-Capillary: 246 mg/dL — ABNORMAL HIGH (ref 70–99)
Glucose-Capillary: 256 mg/dL — ABNORMAL HIGH (ref 70–99)
Glucose-Capillary: 173 mg/dL — ABNORMAL HIGH (ref 70–99)
Glucose-Capillary: 326 mg/dL — ABNORMAL HIGH (ref 70–99)

## 2022-07-09 MED ORDER — INSULIN GLARGINE-YFGN 100 UNIT/ML ~~LOC~~ SOLN
16.0000 [IU] | Freq: Every day | SUBCUTANEOUS | Status: DC
  Administered 2022-07-09 – 2022-07-10 (×2): 16 [IU] via SUBCUTANEOUS
  Filled 2022-07-09 (×2): qty 0.16

## 2022-07-09 MED ORDER — PANTOPRAZOLE SODIUM 40 MG PO TBEC
40.0000 mg | DELAYED_RELEASE_TABLET | Freq: Two times a day (BID) | ORAL | Status: DC
Start: 2022-07-09 — End: 2022-07-10
  Administered 2022-07-09 – 2022-07-10 (×2): 40 mg via ORAL
  Filled 2022-07-09 (×2): qty 1

## 2022-07-09 MED ORDER — ASPIRIN 81 MG PO TBEC
81.0000 mg | DELAYED_RELEASE_TABLET | Freq: Every day | ORAL | Status: DC
  Administered 2022-07-10: 81 mg via ORAL
  Filled 2022-07-09 (×2): qty 1

## 2022-07-09 MED ORDER — ORAL CARE MOUTH RINSE: 15.0000 mL | OROMUCOSAL | Status: DC | PRN

## 2022-07-09 NOTE — Plan of Care (Signed)
  Problem: Fluid Volume: Goal: Ability to maintain a balanced intake and output will improve Outcome: Progressing   Problem: Nutritional: Goal: Maintenance of adequate nutrition will improve Outcome: Progressing   Problem: Skin Integrity: Goal: Risk for impaired skin integrity will decrease Outcome: Progressing   Problem: Tissue Perfusion: Goal: Adequacy of tissue perfusion will improve Outcome: Progressing   Problem: Clinical Measurements: Goal: Respiratory complications will improve Outcome: Progressing Goal: Cardiovascular complication will be avoided Outcome: Progressing   Problem: Activity: Goal: Risk for activity intolerance will decrease Outcome: Progressing

## 2022-07-09 NOTE — Consult Note (Signed)
CARDIOLOGY CONSULT NOTE       Patient ID: Emagene Merfeld MRN: [id number] DOB/AGE: 50-Feb-1972 50 y.o.  Admit date: 07/07/2022 Referring Physician: Sharl Ma Primary Physician: Paschal Dopp, PA Primary Cardiologist: Armanda Magic  Reason for Consultation: Anticoagulation/CAD  Principal Problem:   Diabetic ketoacidosis without coma associated with type 2 diabetes mellitus (HCC) Active Problems:   Severe bipolar I disorder, current or most recent episode depressed (HCC)   Bipolar disorder (HCC)   Depression   Hypertension associated with diabetes (HCC)   Hyperlipidemia associated with type 2 diabetes mellitus (HCC)   Acute pancreatitis without infection or necrosis   AKI (acute kidney injury) (HCC)   Hypokalemia   Hypercalcemia   Coronary artery disease   Thyroid nodule   Leukocytosis   OSA (obstructive sleep apnea)   Esophagitis   HPI:  51 y.o. with generally poor health admitted 07/07/22 with DKA and nausea /vomiting She has history of DM, HTN, OSA not compliant with CPAP, bipolar dx. She has CAD with recent OM intervention by Dr Katrinka Blazing on 05/07/22. She has a chronically occluded distal AV branch and had DES to OM on 5/9 To be on DAT with ASA/Brilinta for a year. She was seen in ER 5/13 with encephalopathy no focal neuro signs CT showed non acute left parietal infarct Neurologist who saw her at that time Dr Selina Cooley did not feel that patient had an acute stroke She recommended ASA/Brilinta but some how patient was started on eliquis ? By primary Dr Wynelle Link. Brilinta was d/c but then resumed by cardiology given recent stent. She has no history of focal neuro deficits, PVD, PAF, DVT or hypercoagulable state She has no indication for DOAC/coumadin Currently in hospital no angina/chest pain Primary service working on better BS control with A1c 9.4   ROS All other systems reviewed and negative except as noted above  Past Medical History:  Diagnosis Date   Anxiety    Arthritis    "knees,  feet, back" (05/24/2015)   Benign essential HTN 05/01/2015   Bipolar disorder (HCC) diagnosed early 90s   Chronic bronchitis (HCC)    "get it q yr"   Chronic lower back pain    Depression    Diabetes mellitus type II 2010   GERD (gastroesophageal reflux disease)    Headache    "maybe 3 times/wk" (05/24/2015)   History of hiatal hernia    History of stomach ulcers    Menopause    Migraine    "1-2/wk" (05/24/2015)   OSA on CPAP     Family History  Problem Relation Age of Onset   Bipolar disorder Mother    CVA Mother    Bipolar disorder Son    Depression Brother        schizoaffective d/o   Hypertension Sister    Heart disease Maternal Aunt    Heart attack Maternal Aunt    Hypertension Brother     Social History   Socioeconomic History   Marital status: Divorced    Spouse name: Not on file   Number of children: 3   Years of education: Not on file   Highest education level: Not on file  Occupational History   Not on file  Tobacco Use   Smoking status: Some Days    Packs/day: 1.00    Years: 30.00    Total pack years: 30.00    Types: Cigarettes   Smokeless tobacco: Never  Vaping Use   Vaping Use: Never used  Substance and Sexual  Activity   Alcohol use: No   Drug use: No   Sexual activity: Yes  Other Topics Concern   Not on file  Social History Narrative   Not on file   Social Determinants of Health   Financial Resource Strain: Not on file  Food Insecurity: Not on file  Transportation Needs: Not on file  Physical Activity: Not on file  Stress: Not on file  Social Connections: Not on file  Intimate Partner Violence: Not on file    Past Surgical History:  Procedure Laterality Date   ABDOMINAL HYSTERECTOMY  2007   BUNIONECTOMY Bilateral    CARPAL TUNNEL RELEASE Right    CORONARY STENT INTERVENTION N/A 05/07/2022   Procedure: CORONARY STENT INTERVENTION;  Surgeon: Lyn Records, MD;  Location: [facility] INVASIVE CV LAB;  Service: Cardiovascular;  Laterality: N/A;    HAMMER TOE SURGERY Left    LEFT HEART CATH AND CORONARY ANGIOGRAPHY N/A 05/20/2018   Procedure: LEFT HEART CATH AND CORONARY ANGIOGRAPHY;  Surgeon: Marykay Lex, MD;  Location: [facility] INVASIVE CV LAB;  Service: Cardiovascular;  Laterality: N/A;   LEFT HEART CATH AND CORONARY ANGIOGRAPHY N/A 05/07/2022   Procedure: LEFT HEART CATH AND CORONARY ANGIOGRAPHY;  Surgeon: Lyn Records, MD;  Location: [facility] INVASIVE CV LAB;  Service: Cardiovascular;  Laterality: N/A;   TONSILLECTOMY Bilateral 05/24/2015   Procedure: TONSILLECTOMY;  Surgeon: Christia Reading, MD;  Location: [facility] OR;  Service: ENT;  Laterality: Bilateral;      Current Facility-Administered Medications:    acetaminophen (TYLENOL) tablet 650 mg, 650 mg, Oral, Q6H PRN, Wouk, Wilfred Curtis, MD, 650 mg at 07/08/22 1701   amitriptyline (ELAVIL) tablet 150 mg, 150 mg, Oral, QHS, Wouk, Wilfred Curtis, MD, 150 mg at 07/08/22 2108   amLODipine (NORVASC) tablet 10 mg, 10 mg, Oral, Daily, Wouk, Wilfred Curtis, MD, 10 mg at 07/09/22 1014   aspirin chewable tablet 81 mg, 81 mg, Oral, Daily, Darreld Mclean R, MD, 81 mg at 07/09/22 1014   busPIRone (BUSPAR) tablet 10 mg, 10 mg, Oral, TID, Wouk, Wilfred Curtis, MD, 10 mg at 07/09/22 1014   dextrose 50 % solution 25 mL, 25 mL, Intravenous, PRN, John Giovanni, MD, 25 mL at 07/08/22 0343   enoxaparin (LOVENOX) injection 40 mg, 40 mg, Subcutaneous, Q24H, Patel, Vishal R, MD, 40 mg at 07/09/22 1013   gabapentin (NEURONTIN) capsule 300 mg, 300 mg, Oral, TID, Wouk, Wilfred Curtis, MD, 300 mg at 07/09/22 1014   insulin aspart (novoLOG) injection 0-15 Units, 0-15 Units, Subcutaneous, TID WC, Wouk, Wilfred Curtis, MD, 8 Units at 07/09/22 1218   insulin aspart (novoLOG) injection 0-5 Units, 0-5 Units, Subcutaneous, QHS, Wouk, Wilfred Curtis, MD, 4 Units at 07/08/22 2116   insulin glargine-yfgn (SEMGLEE) injection 16 Units, 16 Units, Subcutaneous, Daily, Meredeth Ide, MD, 16 Units at 07/09/22 1013   lurasidone (LATUDA) tablet 80 mg,  80 mg, Oral, QPM, Wouk, Wilfred Curtis, MD, 80 mg at 07/08/22 1832   metoprolol tartrate (LOPRESSOR) tablet 25 mg, 25 mg, Oral, BID, Wouk, Wilfred Curtis, MD, 25 mg at 07/09/22 1014   ondansetron (ZOFRAN) injection 4 mg, 4 mg, Intravenous, Q6H PRN, Charlsie Quest, MD   Oral care mouth rinse, 15 mL, Mouth Rinse, PRN, Sharl Ma, Sarina Ill, MD   pantoprazole (PROTONIX) EC tablet 40 mg, 40 mg, Oral, BID, Sharl Ma, Gagan S, MD   rosuvastatin (CRESTOR) tablet 20 mg, 20 mg, Oral, Daily, Wouk, Wilfred Curtis, MD, 20 mg at 07/09/22 1014   ticagrelor (BRILINTA) tablet 90 mg, 90  mg, Oral, BID, Charlsie Quest, MD, 90 mg at 07/09/22 1014   topiramate (TOPAMAX) tablet 200 mg, 200 mg, Oral, QHS, Wouk, Wilfred Curtis, MD, 200 mg at 07/08/22 2109   traZODone (DESYREL) tablet 150 mg, 150 mg, Oral, QHS, Wouk, Wilfred Curtis, MD, 150 mg at 07/08/22 2108  amitriptyline  150 mg Oral QHS   amLODipine  10 mg Oral Daily   aspirin  81 mg Oral Daily   busPIRone  10 mg Oral TID   enoxaparin (LOVENOX) injection  40 mg Subcutaneous Q24H   gabapentin  300 mg Oral TID   insulin aspart  0-15 Units Subcutaneous TID WC   insulin aspart  0-5 Units Subcutaneous QHS   insulin glargine-yfgn  16 Units Subcutaneous Daily   lurasidone  80 mg Oral QPM   metoprolol tartrate  25 mg Oral BID   pantoprazole  40 mg Oral BID   rosuvastatin  20 mg Oral Daily   ticagrelor  90 mg Oral BID   topiramate  200 mg Oral QHS   traZODone  150 mg Oral QHS     Physical Exam: BP 113/84 (BP Location: Right Arm)   Pulse 81   Temp 98.7 F (37.1 C) (Oral)   Resp 18   Ht 5\' 2"  (1.575 m)   Wt 73.6 kg   SpO2 100%   BMI 29.68 kg/m     Affect appropriate Chronically ill black female  HEENT: normal Neck supple with no adenopathy JVP normal no bruits no thyromegaly Lungs clear with no wheezing and good diaphragmatic motion Heart:  S1/S2 no murmur, no rub, gallop or click PMI normal Abdomen: benighn, BS positve, no tenderness, no AAA no bruit.  No HSM or  HJR Distal pulses intact with no bruits No edema Neuro non-focal Skin warm and dry No muscular weakness   Labs:   Lab Results  Component Value Date   WBC 17.4 (H) 07/08/2022   HGB 13.9 07/08/2022   HCT 42.4 07/08/2022   MCV 81.4 07/08/2022   PLT 245 07/08/2022    Recent Labs  Lab 07/09/22 0637  NA 143  K 5.8*  CL 113*  CO2 16*  BUN 14  CREATININE 1.00  CALCIUM 9.1  GLUCOSE 194*   Lab Results  Component Value Date   CKTOTAL 64 07/09/2022   TROPONINI <0.03 05/20/2018    Lab Results  Component Value Date   CHOL 111 05/07/2022   CHOL 184 12/15/2018   CHOL 200 (H) 12/16/2017   Lab Results  Component Value Date   HDL 41 05/07/2022   HDL 44 12/15/2018   HDL 43 12/16/2017   Lab Results  Component Value Date   LDLCALC 58 05/07/2022   LDLCALC 109 (H) 12/15/2018   LDLCALC 123 (H) 12/16/2017   Lab Results  Component Value Date   TRIG 68 07/08/2022   TRIG 60 05/07/2022   TRIG 157 (H) 12/15/2018   Lab Results  Component Value Date   CHOLHDL 2.7 05/07/2022   CHOLHDL 4.2 12/15/2018   CHOLHDL 4.7 (H) 12/16/2017   No results found for: "LDLDIRECT"    Radiology: 12/18/2017 THYROID  Result Date: 07/08/2022 CLINICAL DATA:  Thyroid ultrasound seen on CT EXAM: THYROID ULTRASOUND TECHNIQUE: Ultrasound examination of the thyroid gland and adjacent soft tissues was performed. COMPARISON:  None available FINDINGS: Parenchymal Echotexture: Mildly heterogeneous Isthmus: 0.4 cm Right lobe: 5.6 x 2.4 x 1.8 cm Left lobe: 5.5 x 2.5 x 2.2 cm _________________________________________________________ Estimated total number of nodules >/= 1 cm:  5 Number of spongiform nodules >/=  2 cm not described below (TR1): 0 Number of mixed cystic and solid nodules >/= 1.5 cm not described below (TR2): 0 _________________________________________________________ Nodule # 1: Location: Isthmus; superior Maximum size: 1.0 cm; Other 2 dimensions: 0.4 x 0.8 cm Composition: solid/almost completely solid (2)  Echogenicity: hypoechoic (2) Shape: not taller-than-wide (0) Margins: smooth (0) Echogenic foci: punctate echogenic foci (3) ACR TI-RADS total points: 7. ACR TI-RADS risk category: TR5 (>/= 7 points). ACR TI-RADS recommendations: **Given size (>/= 1.0 cm) and appearance, fine needle aspiration of this highly suspicious nodule should be considered based on TI-RADS criteria. _________________________________________________________ Nodule 2: 0.8 cm solid hypoechoic nodule in the right inferior isthmus does not meet criteria for imaging surveillance or FNA. _________________________________________________________ Nodule 3: 0.8 cm solid hypoechoic nodule in the mid right thyroid lobe does not meet criteria for imaging surveillance or FNA. _________________________________________________________ Nodule # 4: Location: Left; mid Maximum size: 1.6 cm; Other 2 dimensions: 0.9 x 1.0 cm Composition: mixed cystic and solid (1) Echogenicity: isoechoic (1) Shape: not taller-than-wide (0) Margins: smooth (0) Echogenic foci: none (0) ACR TI-RADS total points: 2. ACR TI-RADS risk category: TR2 (2 points). ACR TI-RADS recommendations: This nodule does NOT meet TI-RADS criteria for biopsy or dedicated follow-up. _________________________________________________________ Nodule # 5: Location: Left; mid Maximum size: 1.5 cm; Other 2 dimensions: 1.2 x 1.3 cm Composition: solid/almost completely solid (2) Echogenicity: hypoechoic (2) Shape: not taller-than-wide (0) Margins: smooth (0) Echogenic foci: none (0) ACR TI-RADS total points: 4. ACR TI-RADS risk category: TR4 (4-6 points). ACR TI-RADS recommendations: **Given size (>/= 1.5 cm) and appearance, fine needle aspiration of this moderately suspicious nodule should be considered based on TI-RADS criteria. _________________________________________________________ Nodule # 6: Location: Left; inferior Maximum size: 2.5 cm; Other 2 dimensions: 1.4 x 1.8 cm Composition: solid/almost  completely solid (2) Echogenicity: isoechoic (1) Shape: not taller-than-wide (0) Margins: ill-defined (0) Echogenic foci: none (0) ACR TI-RADS total points: 3. ACR TI-RADS risk category: TR3 (3 points). ACR TI-RADS recommendations: **Given size (>/= 2.5 cm) and appearance, fine needle aspiration of this mildly suspicious nodule should be considered based on TI-RADS criteria. _________________________________________________________ Nodule # 7: Location: Left; inferior Maximum size: 1.2 cm; Other 2 dimensions: 1.0 x 1.1 cm Composition: solid/almost completely solid (2) Echogenicity: isoechoic (1) Shape: not taller-than-wide (0) Margins: ill-defined (0) Echogenic foci: none (0) ACR TI-RADS total points: 3. ACR TI-RADS risk category: TR3 (3 points). ACR TI-RADS recommendations: Given size (<1.4 cm) and appearance, this nodule does NOT meet TI-RADS criteria for biopsy or dedicated follow-up. _________________________________________________________ Nonenlarged lymph nodes noted in the neck bilaterally. IMPRESSION: 1. Nodule 1 (TI-RADS 5), measuring 1.0 cm, located in the superior left isthmus, meets criteria for FNA. 2. Nodule 5 (TI-RADS 4), measuring 1.5 cm, located in the mid left thyroid lobe, meets criteria for FNA. 3. Nodule 6 (TI-RADS 3), measuring 2.5 cm, located in the inferior left thyroid lobe meets criteria for imaging follow-up. Annual ultrasound surveillance is recommended until 5 years of stability is documented. The above is in keeping with the ACR TI-RADS recommendations - J Am Coll Radiol 2017;14:587-595. Electronically Signed   By: Acquanetta Belling M.D.   On: 07/08/2022 08:21   CT Angio Chest/Abd/Pel for Dissection W and/or Wo Contrast  Result Date: 07/07/2022 CLINICAL DATA:  Acute aortic syndrome suspected. EXAM: CT ANGIOGRAPHY CHEST, ABDOMEN AND PELVIS TECHNIQUE: CT abdomen and pelvis 12/16/2020 Multidetector CT imaging through the chest, abdomen and pelvis was performed using the standard protocol  during bolus administration of  intravenous contrast. Multiplanar reconstructed images and MIPs were obtained and reviewed to evaluate the vascular anatomy. RADIATION DOSE REDUCTION: This exam was performed according to the departmental dose-optimization program which includes automated exposure control, adjustment of the mA and/or kV according to patient size and/or use of iterative reconstruction technique. CONTRAST:  75mL OMNIPAQUE IOHEXOL 350 MG/ML SOLN COMPARISON:  None Available. FINDINGS: CTA CHEST FINDINGS Cardiovascular: Preferential opacification of the thoracic aorta. No evidence of thoracic aortic aneurysm or dissection. Normal heart size. No pericardial effusion. Mediastinum/Nodes: Thyroid gland is heterogeneous, enlarged, and contains multiple nodules measuring up to 11 mm. There are no enlarged mediastinal or hilar lymph nodes identified. Esophagus is nondilated. There is mild diffuse esophageal wall thickening. Lungs/Pleura: There is dependent atelectasis in the bilateral lower lobes. The lungs are otherwise clear. There is no pleural effusion or pneumothorax. Musculoskeletal: No chest wall abnormality. No acute osseous findings. There is a rounded sclerotic density measuring 12 mm in the T6 vertebral body. Review of the MIP images confirms the above findings. CTA ABDOMEN AND PELVIS FINDINGS VASCULAR Aorta: Normal caliber aorta without aneurysm, dissection, vasculitis or significant stenosis. Mild calcified atherosclerotic disease present. Celiac: Patent without evidence of aneurysm, dissection, vasculitis or significant stenosis. SMA: Patent without evidence of aneurysm, dissection, vasculitis or significant stenosis. Renals: Both renal arteries are patent without evidence of aneurysm, dissection, vasculitis, fibromuscular dysplasia or significant stenosis. IMA: Patent without evidence of aneurysm, dissection, vasculitis or significant stenosis. Inflow: Patent without evidence of aneurysm,  dissection, vasculitis or significant stenosis. Mild calcified atherosclerotic disease. Veins: No obvious venous abnormality within the limitations of this arterial phase study. Review of the MIP images confirms the above findings. NON-VASCULAR Hepatobiliary: No focal liver abnormality is seen. No gallstones, gallbladder wall thickening, or biliary dilatation. Pancreas: Unremarkable. No pancreatic ductal dilatation or surrounding inflammatory changes. Spleen: Rounded hypodensity in the spleen measures 2.2 cm and has increased in size. Spleen is nonenlarged. Adrenals/Urinary Tract: There is a 4 mm nonobstructing calculus in the superior pole the left kidney, unchanged. No hydronephrosis or perinephric fat stranding. Mild renal cortical scarring seen bilaterally. Adrenal glands are within normal limits. The bladder is distended, but otherwise within normal limits. Stomach/Bowel: Stomach is within normal limits. Appendix appears normal. No evidence of bowel wall thickening, distention, or inflammatory changes. There is a large amount of stool throughout the colon. Lymphatic: No enlarged lymph nodes are seen. Reproductive: Status post hysterectomy. No adnexal masses. Other: No ascites.  Small fat containing umbilical hernia. Musculoskeletal: There are few scattered sclerotic foci within the pelvis measuring up to 1 cm. These appear unchanged from prior. No acute fractures. Review of the MIP images confirms the above findings. IMPRESSION: 1. No evidence for aortic dissection or aneurysm. 2. Mild esophageal wall thickening worrisome for esophagitis. 3. 1.1 cm incidental thyroid nodule with heterogeneous and enlarged thyroid. Recommend thyroid US. Reference: J Am Coll Radiol. 2015 Feb;12(2): 143-50 4. Nonobstructing left renal calculus. 5.  Aortic Atherosclerosis (ICD10-I70.0). Electronically Signed   By: Darliss CheneyAmy  Guttmann M.D.   On: 07/07/2022 21:36   DG Chest Port 1 View  Result Date: 07/07/2022 CLINICAL DATA:  Chest pain  EXAM: PORTABLE CHEST 1 VIEW COMPARISON:  05/06/2022 FINDINGS: The heart size and mediastinal contours are within normal limits. Both lungs are clear. The visualized skeletal structures are unremarkable. IMPRESSION: No active disease. Electronically Signed   By: Jasmine PangKim  Fujinaga M.D.   On: 07/07/2022 19:24    EKG: NSR    ASSESSMENT AND PLAN:   CAD: no angina  chronically occlude AV groove LCX branch and stent DES to OM 05/07/22 Continue ASA/Brilinta.  Anticoagulation: no indication for eliquis d/c. CT showed remote small parietal infarct presentation no focal deficits Doubt from heart cath and no PAF noted during admission for stent on when seen in ER for encephalopathy  DM: Discussed low carb diet.  Target hemoglobin A1c is 6.5 or less.  Continue current medications. HLD: continue statin target LDL < 70 HTN:   consider changing norvasc to ACE/ARB for renal protection given DM  Signed: Charlton Haws 07/09/2022, 2:02 PM

## 2022-07-09 NOTE — Progress Notes (Addendum)
Inpatient Diabetes Program Recommendations  AACE/ADA: New Consensus Statement on Inpatient Glycemic Control (2015)  Target Ranges:  Prepandial:   less than 140 mg/dL      Peak postprandial:   less than 180 mg/dL (1-2 hours)      Critically ill patients:  140 - 180 mg/dL   Lab Results  Component Value Date   GLUCAP 256 (H) 07/09/2022   HGBA1C 9.4 (H) 05/07/2022    Review of Glycemic Control  Latest Reference Range & Units 07/08/22 21:10 07/09/22 06:19 07/09/22 08:42 07/09/22 11:57  Glucose-Capillary 70 - 99 mg/dL 694 (H) 854 (H) 627 (H) 256 (H)   Diabetes history: DM 2 Outpatient Diabetes medications:   Farxiga 10 mg Daily, Ozempic 1 mg weekly, Novolog- in Omnipod Current orders for Inpatient glycemic control:  Novolog moderate tid with meals and HS Semglee 16 units daily (just added)  Inpatient Diabetes Program Recommendations:   Agree with restart of basal insulin. ` Note DKA.  Called [facility] to get pump settings.  Left message for RN to call back.   Do not recommend restart of Farxiga until seen by endocrinology again.  Will follow.   Thanks,  Beryl Meager, RN, BC-ADM Inpatient Diabetes Coordinator Pager [phone]  (8a-5p)  Addendum [phone]- Call received from Dr. Willeen Cass office.  Patient has not been seen since September 2022.  Pump settings at that time were: Basal rate: 1.6 units/hr (total 24 hour basal=38.4 units daily Carbohydrate coverage: 1 unit/ 5 grams of CHO Sensitivity factor: 25 mg/dL Active insulin: 4 hours Goal blood sugar 120 mg/dL. Will follow

## 2022-07-09 NOTE — Progress Notes (Signed)
PT Cancellation Note  Patient Details Name: Stephanie Thornton MRN: [id number] DOB: 10/31/1971   Cancelled Treatment:    Reason Eval/Treat Not Completed: Medical issues which prohibited therapy, potassium 5.8 this AM. Will hold for PT Evaluation; MD (Dr. Sharl Ma) in agreement.  Ina Homes, PT, DPT Acute Rehabilitation Services  Personal: [facility] Office: [phone]  Malachy Chamber 07/09/2022, 8:12 AM

## 2022-07-09 NOTE — Progress Notes (Addendum)
[facility]  PROGRESS NOTE  Stephanie Thornton ZOX:[id number] DOB: August 09, 1971 DOA: 07/07/2022 PCP: Paschal Dopp, PA   Brief HPI:   51 year old female with history of CAD with recent NSTEMI s/p recent 1 intervention on 05/07/2022, was been on aspirin and Brilinta for 1 year.  Also on Eliquis.  Diabetes mellitus type 2, asthma, hypertension, hyperlipidemia, depression/anxiety/bipolar disorder, OSA not on CPAP came to ED for evaluation of nausea vomiting and urinary frequency.  She was found to have elevated blood glucose in the ED.  Patient gave herself 10 units of insulin prior to arrival.    Subjective   Patient seen and examined, denies any complaints.   Assessment/Plan:     Diabetic ketoacidosis without coma -DKA likely from discontinuation of insulin pump; she was on Farxiga which is currently on hold -DKA resolved, patient was initially on insulin gtt. which was discontinued -Started on sliding scale insulin with NovoLog -CBG still elevated, will start Semglee 16 units subcu daily  Metabolic acidosis -This morning labs again showed metabolic acidosis, doubt it is DKA -Likely from hyperchloremic metabolic acidosis -Discontinue IV fluids -Will not restart insulin gtt -We will repeat labs later today  Acute kidney injury -Resolved with IV fluids  Elevated lipase -Presented with nausea, vomiting, epigastric pain -Lipase was 148 -No signs of pancreatitis on imaging -Triglyceride level 68   Hypertension -Continue amlodipine  Hyperkalemia -Due to overcorrection of low potassium -Hold Ringer lactate with potassium -Repeat labs today  Thyroid nodule 1.1 cm incidental thyroid nodule with heterogeneous enlarged thyroid seen on CT imaging.   -Has had suppressed TSH  with normal free T3 and T4 two months ago. -Thyroid ultrasound shows multiple nodules nodule 1 measuring 1 cm which criteria for FNA, nodule #5 1.5 cm also meets criteria for FNA, nodule # 6 2.5 cm  located in the inferior left thyroid lobe meets criteria for imaging follow-up. -Patient will need endocrinology follow-up as outpatient, she was seen by endocrinologist in the past; I discussed this in detail with patient and she agrees with getting endocrinology evaluation as outpatient  Coronary disease -Recent NSTEMI s/p DES to left circumflex on 05/07/2022 -Patient was on aspirin, Brilinta and Eliquis -Consulted cardiology and they have discontinued patient's Eliquis -No indication for Eliquis at this time -Continue aspirin, Brilinta  Depression/anxiety/bipolar disorder -Continue Topamax, lurasidone, buspirone   Medications     amitriptyline  150 mg Oral QHS   aspirin EC  81 mg Oral Daily   busPIRone  10 mg Oral TID   enoxaparin (LOVENOX) injection  40 mg Subcutaneous Q24H   gabapentin  300 mg Oral TID   insulin aspart  0-15 Units Subcutaneous TID WC   insulin aspart  0-5 Units Subcutaneous QHS   insulin glargine-yfgn  16 Units Subcutaneous Daily   lurasidone  80 mg Oral QPM   metoprolol tartrate  25 mg Oral BID   pantoprazole  40 mg Oral BID   rosuvastatin  20 mg Oral Daily   ticagrelor  90 mg Oral BID   topiramate  200 mg Oral QHS   traZODone  150 mg Oral QHS     Data Reviewed:   CBG:  Recent Labs  Lab 07/08/22 1810 07/08/22 2110 07/09/22 0619 07/09/22 0842 07/09/22 1157  GLUCAP 150* 321* 192* 246* 256*    SpO2: 100 %    Vitals:   07/09/22 0422 07/09/22 0831 07/09/22 1013 07/09/22 1157  BP: 127/86 102/63 107/72 113/84  Pulse: 86 90 91 81  Resp: 17  18  Temp: 98.9 F (37.2 C) 98.3 F (36.8 C)  98.7 F (37.1 C)  TempSrc: Oral Oral  Oral  SpO2: 98% 94%  100%  Weight:      Height:          Data Reviewed:  Basic Metabolic Panel: Recent Labs  Lab 07/07/22 1905 07/07/22 2155 07/08/22 0423 07/08/22 1430 07/09/22 0137 07/09/22 0637  NA  --  146* 147* 143 140 143  K  --  3.7 2.9* 3.7 4.5 5.8*  CL  --  109 107 109 107 113*  CO2  --  26 27  24  21* 16*  GLUCOSE  --  141* 132* 196* 218* 194*  BUN  --  18 16 15 12 14   CREATININE  --  1.07* 0.78 0.77 0.85 1.00  CALCIUM  --  9.9 9.7 8.8* 9.5 9.1  MG 2.4  --   --   --   --   --     CBC: Recent Labs  Lab 07/07/22 1843 07/07/22 1901 07/08/22 0423  WBC 15.7*  --  17.4*  NEUTROABS 12.2*  --   --   HGB 16.2* 17.7* 13.9  HCT 49.1* 52.0* 42.4  MCV 80.6  --  81.4  PLT 367  --  245    LFT No results for input(s): "AST", "ALT", "ALKPHOS", "BILITOT", "PROT", "ALBUMIN" in the last 168 hours.   Antibiotics: Anti-infectives (From admission, onward)    None        DVT prophylaxis: Apixaban  Code Status: Full code  Family Communication: No family at bedside   CONSULTS    Objective    Physical Examination:  General-appears in no acute distress Heart-S1-S2, regular, no murmur auscultated Lungs-clear to auscultation bilaterally, no wheezing or crackles auscultated Abdomen-soft, nontender, no organomegaly Extremities-no edema in the lower extremities Neuro-alert, oriented x3, no focal deficit noted  Status is: Inpatient:             09/07/22   [facility] If 7PM-7AM, please contact night-coverage at [email or web address], Office  [phone]   07/09/2022, 2:44 PM  LOS: 2 days

## 2022-07-09 NOTE — Evaluation (Signed)
Physical Therapy Evaluation & Discharge Patient Details Name: Stephanie Thornton MRN: [id number] DOB: 11-16-1971 Today's Date: 07/09/2022  History of Present Illness  Pt is a 51 y.o. female admitted 07/07/22 with c/o nausea, vomiting, urinary frequency. Workup for DKA. PMH includes recent CAD with NSTEMI s/p DES to LCx (04/2022), HTN, OSA, DM2, HLD, depression, anxiety, bipolar disorder.   Clinical Impression  Patient evaluated by Physical Therapy with no further acute PT needs identified. PTA, pt independent, works, lives alone. Today, pt independent with mobility; initially falling asleep during conversation, but more awake once upright. All education has been completed and the patient has no further questions. Acute PT is signing off. Thank you for this referral.   Recommendations for follow up therapy are one component of a multi-disciplinary discharge planning process, led by the attending physician.  Recommendations may be updated based on patient status, additional functional criteria and insurance authorization.  Follow Up Recommendations No PT follow up      Assistance Recommended at Discharge PRN  Patient can return home with the following   N/A    Equipment Recommendations None recommended by PT         Functional Status Assessment Patient has not had a recent decline in their functional status     Precautions / Restrictions Precautions Precautions: None Restrictions Weight Bearing Restrictions: No      Mobility  Bed Mobility Overal bed mobility: Independent                  Transfers Overall transfer level: Independent                      Ambulation/Gait Ambulation/Gait assistance: Independent Gait Distance (Feet): 240 Feet Assistive device: None Gait Pattern/deviations: Step-through pattern, Decreased stride length       General Gait Details: Slow, tired gait independent without DME  Stairs            Wheelchair Mobility     Modified Rankin (Stroke Patients Only)       Balance Overall balance assessment: No apparent balance deficits (not formally assessed)                                           Pertinent Vitals/Pain Pain Assessment Pain Assessment: No/denies pain    Home Living Family/patient expects to be discharged to:: Private residence Living Arrangements: Alone Available Help at Discharge: Family;Available PRN/intermittently Type of Home: House Home Access: Stairs to enter Entrance Stairs-Rails: Right Entrance Stairs-Number of Steps: 2   Home Layout: One level Home Equipment: None      Prior Function Prior Level of Function : Independent/Modified Independent             Mobility Comments: Independent; works mornings as breakfast host. Reports no issues getting medications or groceries       Hand Dominance        Extremity/Trunk Assessment   Upper Extremity Assessment Upper Extremity Assessment: Overall WFL for tasks assessed    Lower Extremity Assessment Lower Extremity Assessment: Overall WFL for tasks assessed       Communication   Communication: No difficulties  Cognition Arousal/Alertness: Awake/alert Behavior During Therapy: Flat affect Overall Cognitive Status: Within Functional Limits for tasks assessed  General Comments: WFL simple tasks, suspect flat affect related to sleepiness, pt requiring cues to stay awake during initial conversation but more alert with mobility        General Comments      Exercises     Assessment/Plan    PT Assessment Patient does not need any further PT services  PT Problem List         PT Treatment Interventions      PT Goals (Current goals can be found in the Care Plan section)  Acute Rehab PT Goals PT Goal Formulation: All assessment and education complete, DC therapy    Frequency       Co-evaluation               AM-PAC PT "6  Clicks" Mobility  Outcome Measure Help needed turning from your back to your side while in a flat bed without using bedrails?: None Help needed moving from lying on your back to sitting on the side of a flat bed without using bedrails?: None Help needed moving to and from a bed to a chair (including a wheelchair)?: None Help needed standing up from a chair using your arms (e.g., wheelchair or bedside chair)?: None Help needed to walk in hospital room?: None Help needed climbing 3-5 steps with a railing? : None 6 Click Score: 24    End of Session   Activity Tolerance: Patient tolerated treatment well;Patient limited by fatigue Patient left: in bed;with call bell/phone within reach Nurse Communication: Mobility status PT Visit Diagnosis: Other abnormalities of gait and mobility (R26.89)    Time: 0865-7846 PT Time Calculation (min) (ACUTE ONLY): 10 min   Charges:   PT Evaluation $PT Eval Low Complexity: 1 Low         Ina Homes, PT, DPT Acute Rehabilitation Services  Personal: Secure Chat Rehab Office: [phone]  Malachy Chamber 07/09/2022, 3:22 PM

## 2022-07-10 ENCOUNTER — Other Ambulatory Visit (HOSPITAL_COMMUNITY): Payer: Self-pay

## 2022-07-10 DIAGNOSIS — E1110 Type 2 diabetes mellitus with ketoacidosis without coma: Secondary | ICD-10-CM | POA: Diagnosis not present

## 2022-07-10 LAB — BASIC METABOLIC PANEL
Anion gap: 7 (ref 5–15)
BUN: 9 mg/dL (ref 6–20)
CO2: 21 mmol/L — ABNORMAL LOW (ref 22–32)
Calcium: 8.9 mg/dL (ref 8.9–10.3)
Chloride: 111 mmol/L (ref 98–111)
Creatinine, Ser: 0.89 mg/dL (ref 0.44–1.00)
GFR, Estimated: >60 mL/min (ref >60)
Glucose, Bld: 318 mg/dL — ABNORMAL HIGH (ref 70–99)
Potassium: 3.9 mmol/L (ref 3.5–5.1)
Sodium: 139 mmol/L (ref 135–145)

## 2022-07-10 LAB — GLUCOSE, CAPILLARY
Glucose-Capillary: 277 mg/dL — ABNORMAL HIGH (ref 70–99)
Glucose-Capillary: 219 mg/dL — ABNORMAL HIGH (ref 70–99)

## 2022-07-10 MED ORDER — PANTOPRAZOLE SODIUM 40 MG PO TBEC
40.0000 mg | DELAYED_RELEASE_TABLET | Freq: Every day | ORAL | 1 refills | Status: DC
  Filled 2022-07-10 – 2022-08-13 (×2): qty 30, 30d supply, fill #0

## 2022-07-10 NOTE — Inpatient Diabetes Management (Signed)
Inpatient Diabetes Program Recommendations  AACE/ADA: New Consensus Statement on Inpatient Glycemic Control (2015)  Target Ranges:  Prepandial:   less than 140 mg/dL      Peak postprandial:   less than 180 mg/dL (1-2 hours)      Critically ill patients:  140 - 180 mg/dL   Lab Results  Component Value Date   GLUCAP 277 (H) 07/10/2022   HGBA1C 9.4 (H) 05/07/2022    Review of Glycemic Control  Latest Reference Range & Units 07/09/22 06:19 07/09/22 08:42 07/09/22 11:57 07/09/22 16:45 07/09/22 20:56 07/10/22 06:33  Glucose-Capillary 70 - 99 mg/dL 937 (H) 342 (H) 876 (H) 173 (H) 326 (H) 277 (H)   Diabetes history: DM 2 Outpatient Diabetes medications:   Farxiga 10 mg Daily, , Novolog- in Omnipod Current orders for Inpatient glycemic control:  Novolog moderate tid with meals and HS Semglee 16 units daily (just added)  Inpatient Diabetes Program Recommendations:    Spoke with patient again today.  She now states that she has been on the [place] for a while and has been taking it for a year or more. Will update MD regarding this.  Explained that she will not be on Farxiga at discharge and will need to f/u with endocrinology ASAP.  Per Dr. Willeen Cass office, she has not been seen since Sept. Of 2022.   Patient to resume insulin pump at d/c.  Explained to patient that she has received basal insulin this morning and may need to reduce basal rates this evening until the morning.  She wears CGM which does give alerts if blood sugars are dropping.  Patient states she is comfortable reducing basal rates if needed.  Encouraged close f/u with MD who manages diabetes.  Patient verbalized understanding.   Thanks,  Beryl Meager, RN, BC-ADM Inpatient Diabetes Coordinator Pager [phone]  (8a-5p)

## 2022-07-10 NOTE — Plan of Care (Signed)

## 2022-07-10 NOTE — Care Management Obs Status (Signed)
MEDICARE OBSERVATION STATUS NOTIFICATION   Patient Details  Name: Stephanie Thornton MRN: [id number] Date of Birth: [birth year]   Medicare Observation Status Notification Given:  Yes    Beckie Busing, RN 07/10/2022, 11:19 AM

## 2022-07-10 NOTE — Progress Notes (Signed)
2 PIV's removed without complication, site is clean dry and intact.  Discharge instructions given to patient at bedside, patient has ride waiting.

## 2022-07-10 NOTE — Care Management Obs Status (Signed)
MEDICARE OBSERVATION STATUS NOTIFICATION   Patient Details  Name: Stephanie Thornton MRN: [id number] Date of Birth: 01/08/1971   Medicare Observation Status Notification Given:  Yes    Braylon Lemmons L Ronesha Heenan, RN 07/10/2022, 11:19 AM 

## 2022-07-10 NOTE — Discharge Summary (Signed)
Physician Discharge Summary  Stephanie Thornton VZC:[id number] DOB: 1971-07-25 DOA: 07/07/2022  PCP: Milford Cage, PA  Admit date: 07/07/2022 Discharge date: 07/10/2022  Time spent: 40 minutes  Recommendations for Outpatient Follow-up:  Follow outpatient CBC/CMP  Follow diabetes with endocrine outpatient Needs FNA outpatient for thyroid nodules and outpatient surveillance Esophageal wall thickening concerning for esophagitis -> GI referral  Ensure compliance with diabetic diet, adjustment to insulin regimen as needed  Discharge Diagnoses:  Principal Problem:   Diabetic ketoacidosis without coma associated with type 2 diabetes mellitus ([place]) Active Problems:   Acute pancreatitis without infection or necrosis   AKI (acute kidney injury) ([place])   Hypokalemia   Coronary artery disease   Esophagitis   Hypercalcemia   Severe bipolar I disorder, current or most recent episode depressed ([place])   Depression   Hypertension associated with diabetes ([place])   Hyperlipidemia associated with type 2 diabetes mellitus ([place])   Thyroid nodule   Leukocytosis   OSA (obstructive sleep apnea)   Bipolar disorder ([place])   DKA (diabetic ketoacidosis) ([place])   Discharge Condition: stable  Diet recommendation: heart healthy, diabetic  Filed Weights   07/07/22 1844  Weight: 73.6 kg    History of present illness:  Stephanie Thornton is Stephanie Thornton 51 y.o. female with medical history significant for CAD with recent NSTEMI s/p to LCx 05/07/2022, T2DM, asthma, HTN, HLD, depression/anxiety/bipolar disorder, OSA not using CPAP who is admitted with DKA associated with type 2 diabetes with possible mild pancreatitis.   She's improved at this point with treatment.  Planning for discharge back on insulin pump.  She'll need to follow up with her PCP and endocrinologist outpatient.  See below for additional details  Hospital Course:  Assessment and Plan: * Diabetic ketoacidosis without coma associated with type 2  diabetes mellitus ([place]) Resolved  Seems to have been precipitated with nausea/vomiting/abdominal discomfort, mild pancreatitis? Now back to her baseline, she wants to resume insulin pump at discharge She needs to follow with endocrinology outpatient  Hold ozempic and farxiga at discharge  Acute pancreatitis without infection or necrosis Mildly elevated lipase CT without evidence of pancreatitis Mild pancreatitis  AKI (acute kidney injury) ([place]) Improved today  Esophagitis Mild esophageal wall thickening concerning for esophagitis protonix daily GI referral outpatient   Coronary artery disease nstemi s/p DES to L circumflex 05/07/2022 Continue aspirin and brilinta at discharge (eliquis discontinued per cards)  Hypokalemia wnl  Hypercalcemia resolved  Depression Continue home meds  Hypertension associated with diabetes ([place]) Resume home meds  Hyperlipidemia associated with type 2 diabetes mellitus ([place]) Resume statin when tolerating oral.  Thyroid nodule 1.1 cm incidental thyroid nodule with heterogeneous enlarged thyroid seen on CT imaging.  Has had suppressed TSH  with normal free T3 and T4 two months ago.  Thyroid US -> has nodules that meet criteria for FNA.  Needs annual [place] surveillance for another.  Needs to follow up with this with regards to need for follow up.  Leukocytosis Suspect reactive, follow outpatient   OSA (obstructive sleep apnea) Has not been using CPAP.  Use supplemental oxygen as needed.      Procedures: none   Consultations: cardiology  Discharge Exam: Vitals:   07/10/22 0804 07/10/22 1206  BP: 121/90 131/83  Pulse: 77 79  Resp:    Temp: 98.2 F (36.8 C) 98.1 F (36.7 C)  SpO2:     No complaints Eager for discharge home Discussed d/c plan and recommendations  General: No acute distress. Cardiovascular: RRR Lungs:  unlabored Abdomen: Soft, nontender, nondistended Neurological: Alert and oriented 3. Moves all extremities  4 with equal strength. Cranial nerves II through XII grossly intact. Extremities: No clubbing or cyanosis. No edema.  Discharge Instructions   Discharge Instructions     Ambulatory referral to Gastroenterology   Complete by: As directed    What is the reason for referral?: Other Comment - esophagitis   Call MD for:  difficulty breathing, headache or visual disturbances   Complete by: As directed    Call MD for:  extreme fatigue   Complete by: As directed    Call MD for:  hives   Complete by: As directed    Call MD for:  persistant dizziness or light-headedness   Complete by: As directed    Call MD for:  persistant nausea and vomiting   Complete by: As directed    Call MD for:  redness, tenderness, or signs of infection (pain, swelling, redness, odor or green/yellow discharge around incision site)   Complete by: As directed    Call MD for:  severe uncontrolled pain   Complete by: As directed    Call MD for:  temperature >100.4   Complete by: As directed    Diet - low sodium heart healthy   Complete by: As directed    Discharge instructions   Complete by: As directed    You were seen for DKA.  You've improved on insulin.  It's important that you follow up with endocrinology outpatient.  Resume your insulin pump as previously prescribed.  You had Stephanie Thornton thyroid nodule on imaging.  You'll need to follow up with your PCP or your endocrinologist as an outpatient for Stephanie Thornton fine needle aspiration.  Stop your eliquis.  Continue the aspirin and brilinta.  You should follow up with cardiology outpatient.  You had esophagitis on your CT scan, you should follow up with gastroenterology outpatient for further evaluation.  I'll refer you to gastroenterology.  Return for new, recurrent, or worsening symptoms.  Please ask your PCP to request records from this hospitalization so they know what was done and what the next steps will be.   Increase activity slowly   Complete by: As directed        Allergies as of 07/10/2022       Reactions   Lyrica [pregabalin] Shortness Of Breath, Itching, Rash   Rocephin [ceftriaxone Sodium In Dextrose] Shortness Of Breath   Sulfa Antibiotics Hives, Shortness Of Breath, Itching   Ultram [tramadol] Hives, Shortness Of Breath   Had asthma attack   Vancomycin Anaphylaxis   Per Dr. Henderson Cloud   Glucotrol [glipizide] Hives   Keflex [cephalexin] Hives   Penicillins Hives   Has patient had Stephanie Thornton PCN reaction causing immediate rash, facial/tongue/throat swelling, SOB or lightheadedness with hypotension: YES Has patient had Stephanie Thornton PCN reaction causing severe rash involving mucus membranes or skin necrosis: NO Has patient had Stephanie Thornton PCN reaction that required hospitalization: YES Has patient had Stephanie Thornton PCN reaction occurring within the last 10 years: YES If all of the above answers are "NO", then may proceed with Cephalosporin use.   Janumet [sitagliptin-metformin Hcl] Nausea Only        Medication List     STOP taking these medications    atorvastatin 80 MG tablet Commonly known as: LIPITOR   Eliquis 5 MG Tabs tablet Generic drug: apixaban   Farxiga 10 MG Tabs tablet Generic drug: dapagliflozin propanediol   olmesartan 40 MG tablet Commonly known as: BENICAR   oxyCODONE  15 MG immediate release tablet Commonly known as: ROXICODONE   Ozempic (1 MG/DOSE) 4 MG/3ML Sopn Generic drug: Semaglutide (1 MG/DOSE)       TAKE these medications    albuterol 108 (90 Base) MCG/ACT inhaler Commonly known as: VENTOLIN HFA Inhale 1-2 puffs into the lungs every 6 (six) hours as needed for wheezing or shortness of breath. What changed:  how much to take when to take this reasons to take this   amitriptyline 150 MG tablet Commonly known as: ELAVIL Take 150 mg by mouth at bedtime.   amLODipine 10 MG tablet Commonly known as: NORVASC Take 1 tablet (10 mg total) by mouth daily.   aspirin 81 MG chewable tablet Chew 1 tablet (81 mg total) by mouth daily.    baclofen 10 MG tablet Commonly known as: LIORESAL Take 10 mg by mouth 3 (three) times daily.   Brilinta 90 MG Tabs tablet Generic drug: ticagrelor Take 1 tablet (90 mg total) by mouth 2 (two) times daily.   busPIRone 10 MG tablet Commonly known as: BUSPAR Take 10 mg by mouth 3 (three) times daily.   furosemide 20 MG tablet Commonly known as: LASIX Take 2 tablets by mouth daily X's 3 days then reduce to 1 tablet by mouth daily. What changed:  how much to take how to take this when to take this additional instructions   gabapentin 300 MG capsule Commonly known as: NEURONTIN Take 1 capsule (300 mg total) by mouth 3 (three) times daily.   Latuda 80 MG Tabs tablet Generic drug: lurasidone Take 80 mg by mouth every evening.   metoprolol tartrate 25 MG tablet Commonly known as: LOPRESSOR Take 1 tablet (25 mg total) by mouth 2 (two) times daily.   naloxone 4 MG/0.1ML Liqd nasal spray kit Commonly known as: NARCAN Place 0.4 mg into the nose once.   nitroGLYCERIN 0.4 MG SL tablet Commonly known as: NITROSTAT Place 1 tablet (0.4 mg total) under the tongue every 5 (five) minutes x 3 doses as needed for chest pain.   NovoLOG 100 UNIT/ML injection Generic drug: insulin aspart Inject 5-22 Units into the skin in the morning, at noon, in the evening, and at bedtime.   Olmesartan-amLODIPine-HCTZ 40-10-25 MG Tabs Take 1 tablet by mouth daily.   pantoprazole 40 MG tablet Commonly known as: Protonix Take 1 tablet (40 mg total) by mouth daily.   potassium chloride 10 MEQ tablet Commonly known as: KLOR-CON Take 2 tablets by mouth daily X's 3 days then reduce to 1 tablet by mouth daily. What changed:  how much to take how to take this when to take this additional instructions   rosuvastatin 20 MG tablet Commonly known as: CRESTOR Take 20 mg by mouth at bedtime. What changed: Another medication with the same name was removed. Continue taking this medication, and follow the  directions you see here.   topiramate 200 MG tablet Commonly known as: TOPAMAX Take 200 mg by mouth at bedtime.   traZODone 100 MG tablet Commonly known as: DESYREL Take 200 mg by mouth at bedtime.   TYLENOL PO Take 2 tablets by mouth 3 (three) times daily as needed (tooth ache).       Allergies  Allergen Reactions   Lyrica [Pregabalin] Shortness Of Breath, Itching and Rash   Rocephin [Ceftriaxone Sodium In Dextrose] Shortness Of Breath   Sulfa Antibiotics Hives, Shortness Of Breath and Itching   Ultram [Tramadol] Hives and Shortness Of Breath    Had asthma attack   Vancomycin  Anaphylaxis    Per Dr. Chari Manning [Glipizide] Hives   Keflex [Cephalexin] Hives   Penicillins Hives    Has patient had Josep Luviano PCN reaction causing immediate rash, facial/tongue/throat swelling, SOB or lightheadedness with hypotension: YES Has patient had Shameca Landen PCN reaction causing severe rash involving mucus membranes or skin necrosis: NO Has patient had Stephanie Thornton PCN reaction that required hospitalization: YES Has patient had Stephanie Thornton PCN reaction occurring within the last 10 years: YES If all of the above answers are "NO", then may proceed with Cephalosporin use.   Janumet [Sitagliptin-Metformin Hcl] Nausea Only      The results of significant diagnostics from this hospitalization (including imaging, microbiology, ancillary and laboratory) are listed below for reference.    Significant Diagnostic Studies: US THYROID  Result Date: 07/08/2022 CLINICAL DATA:  Thyroid ultrasound seen on CT EXAM: THYROID ULTRASOUND TECHNIQUE: Ultrasound examination of the thyroid gland and adjacent soft tissues was performed. COMPARISON:  None available FINDINGS: Parenchymal Echotexture: Mildly heterogeneous Isthmus: 0.4 cm Right lobe: 5.6 x 2.4 x 1.8 cm Left lobe: 5.5 x 2.5 x 2.2 cm _________________________________________________________ Estimated total number of nodules >/= 1 cm: 5 Number of spongiform nodules >/=  2 cm not described  below (TR1): 0 Number of mixed cystic and solid nodules >/= 1.5 cm not described below (TR2): 0 _________________________________________________________ Nodule # 1: Location: Isthmus; superior Maximum size: 1.0 cm; Other 2 dimensions: 0.4 x 0.8 cm Composition: solid/almost completely solid (2) Echogenicity: hypoechoic (2) Shape: not taller-than-wide (0) Margins: smooth (0) Echogenic foci: punctate echogenic foci (3) ACR TI-RADS total points: 7. ACR TI-RADS risk category: TR5 (>/= 7 points). ACR TI-RADS recommendations: **Given size (>/= 1.0 cm) and appearance, fine needle aspiration of this highly suspicious nodule should be considered based on TI-RADS criteria. _________________________________________________________ Nodule 2: 0.8 cm solid hypoechoic nodule in the right inferior isthmus does not meet criteria for imaging surveillance or FNA. _________________________________________________________ Nodule 3: 0.8 cm solid hypoechoic nodule in the mid right thyroid lobe does not meet criteria for imaging surveillance or FNA. _________________________________________________________ Nodule # 4: Location: Left; mid Maximum size: 1.6 cm; Other 2 dimensions: 0.9 x 1.0 cm Composition: mixed cystic and solid (1) Echogenicity: isoechoic (1) Shape: not taller-than-wide (0) Margins: smooth (0) Echogenic foci: none (0) ACR TI-RADS total points: 2. ACR TI-RADS risk category: TR2 (2 points). ACR TI-RADS recommendations: This nodule does NOT meet TI-RADS criteria for biopsy or dedicated follow-up. _________________________________________________________ Nodule # 5: Location: Left; mid Maximum size: 1.5 cm; Other 2 dimensions: 1.2 x 1.3 cm Composition: solid/almost completely solid (2) Echogenicity: hypoechoic (2) Shape: not taller-than-wide (0) Margins: smooth (0) Echogenic foci: none (0) ACR TI-RADS total points: 4. ACR TI-RADS risk category: TR4 (4-6 points). ACR TI-RADS recommendations: **Given size (>/= 1.5 cm) and  appearance, fine needle aspiration of this moderately suspicious nodule should be considered based on TI-RADS criteria. _________________________________________________________ Nodule # 6: Location: Left; inferior Maximum size: 2.5 cm; Other 2 dimensions: 1.4 x 1.8 cm Composition: solid/almost completely solid (2) Echogenicity: isoechoic (1) Shape: not taller-than-wide (0) Margins: ill-defined (0) Echogenic foci: none (0) ACR TI-RADS total points: 3. ACR TI-RADS risk category: TR3 (3 points). ACR TI-RADS recommendations: **Given size (>/= 2.5 cm) and appearance, fine needle aspiration of this mildly suspicious nodule should be considered based on TI-RADS criteria. _________________________________________________________ Nodule # 7: Location: Left; inferior Maximum size: 1.2 cm; Other 2 dimensions: 1.0 x 1.1 cm Composition: solid/almost completely solid (2) Echogenicity: isoechoic (1) Shape: not taller-than-wide (0) Margins: ill-defined (0) Echogenic foci: none (  0) ACR TI-RADS total points: 3. ACR TI-RADS risk category: TR3 (3 points). ACR TI-RADS recommendations: Given size (<1.4 cm) and appearance, this nodule does NOT meet TI-RADS criteria for biopsy or dedicated follow-up. _________________________________________________________ Nonenlarged lymph nodes noted in the neck bilaterally. IMPRESSION: 1. Nodule 1 (TI-RADS 5), measuring 1.0 cm, located in the superior left isthmus, meets criteria for FNA. 2. Nodule 5 (TI-RADS 4), measuring 1.5 cm, located in the mid left thyroid lobe, meets criteria for FNA. 3. Nodule 6 (TI-RADS 3), measuring 2.5 cm, located in the inferior left thyroid lobe meets criteria for imaging follow-up. Annual ultrasound surveillance is recommended until 5 years of stability is documented. The above is in keeping with the ACR TI-RADS recommendations - J Am Coll Radiol 2017;14:587-595. Electronically Signed   By: Miachel Roux M.D.   On: 07/08/2022 08:21   CT Angio Chest/Abd/Pel for  Dissection W and/or Wo Contrast  Result Date: 07/07/2022 CLINICAL DATA:  Acute aortic syndrome suspected. EXAM: CT ANGIOGRAPHY CHEST, ABDOMEN AND PELVIS TECHNIQUE: CT abdomen and pelvis 12/16/2020 Multidetector CT imaging through the chest, abdomen and pelvis was performed using the standard protocol during bolus administration of intravenous contrast. Multiplanar reconstructed images and MIPs were obtained and reviewed to evaluate the vascular anatomy. RADIATION DOSE REDUCTION: This exam was performed according to the departmental dose-optimization program which includes automated exposure control, adjustment of the mA and/or kV according to patient size and/or use of iterative reconstruction technique. CONTRAST:  76m OMNIPAQUE IOHEXOL 350 MG/ML SOLN COMPARISON:  None Available. FINDINGS: CTA CHEST FINDINGS Cardiovascular: Preferential opacification of the thoracic aorta. No evidence of thoracic aortic aneurysm or dissection. Normal heart size. No pericardial effusion. Mediastinum/Nodes: Thyroid gland is heterogeneous, enlarged, and contains multiple nodules measuring up to 11 mm. There are no enlarged mediastinal or hilar lymph nodes identified. Esophagus is nondilated. There is mild diffuse esophageal wall thickening. Lungs/Pleura: There is dependent atelectasis in the bilateral lower lobes. The lungs are otherwise clear. There is no pleural effusion or pneumothorax. Musculoskeletal: No chest wall abnormality. No acute osseous findings. There is Stephanie Thornton rounded sclerotic density measuring 12 mm in the T6 vertebral body. Review of the MIP images confirms the above findings. CTA ABDOMEN AND PELVIS FINDINGS VASCULAR Aorta: Normal caliber aorta without aneurysm, dissection, vasculitis or significant stenosis. Mild calcified atherosclerotic disease present. Celiac: Patent without evidence of aneurysm, dissection, vasculitis or significant stenosis. SMA: Patent without evidence of aneurysm, dissection, vasculitis or  significant stenosis. Renals: Both renal arteries are patent without evidence of aneurysm, dissection, vasculitis, fibromuscular dysplasia or significant stenosis. IMA: Patent without evidence of aneurysm, dissection, vasculitis or significant stenosis. Inflow: Patent without evidence of aneurysm, dissection, vasculitis or significant stenosis. Mild calcified atherosclerotic disease. Veins: No obvious venous abnormality within the limitations of this arterial phase study. Review of the MIP images confirms the above findings. NON-VASCULAR Hepatobiliary: No focal liver abnormality is seen. No gallstones, gallbladder wall thickening, or biliary dilatation. Pancreas: Unremarkable. No pancreatic ductal dilatation or surrounding inflammatory changes. Spleen: Rounded hypodensity in the spleen measures 2.2 cm and has increased in size. Spleen is nonenlarged. Adrenals/Urinary Tract: There is Stephanie Thornton 4 mm nonobstructing calculus in the superior pole the left kidney, unchanged. No hydronephrosis or perinephric fat stranding. Mild renal cortical scarring seen bilaterally. Adrenal glands are within normal limits. The bladder is distended, but otherwise within normal limits. Stomach/Bowel: Stomach is within normal limits. Appendix appears normal. No evidence of bowel wall thickening, distention, or inflammatory changes. There is Stephanie Thornton large amount of stool throughout the colon.  Lymphatic: No enlarged lymph nodes are seen. Reproductive: Status post hysterectomy. No adnexal masses. Other: No ascites.  Small fat containing umbilical hernia. Musculoskeletal: There are few scattered sclerotic foci within the pelvis measuring up to 1 cm. These appear unchanged from prior. No acute fractures. Review of the MIP images confirms the above findings. IMPRESSION: 1. No evidence for aortic dissection or aneurysm. 2. Mild esophageal wall thickening worrisome for esophagitis. 3. 1.1 cm incidental thyroid nodule with heterogeneous and enlarged thyroid.  Recommend thyroid US. Reference: J Am Coll Radiol. 2015 Feb;12(2): 143-50 4. Nonobstructing left renal calculus. 5.  Aortic Atherosclerosis (ICD10-I70.0). Electronically Signed   By: Ronney Asters M.D.   On: 07/07/2022 21:36   DG Chest Port 1 View  Result Date: 07/07/2022 CLINICAL DATA:  Chest pain EXAM: PORTABLE CHEST 1 VIEW COMPARISON:  05/06/2022 FINDINGS: The heart size and mediastinal contours are within normal limits. Both lungs are clear. The visualized skeletal structures are unremarkable. IMPRESSION: No active disease. Electronically Signed   By: Donavan Foil M.D.   On: 07/07/2022 19:24    Microbiology: Recent Results (from the past 240 hour(s))  MRSA Next Gen by PCR, Nasal     Status: None   Collection Time: 07/08/22  5:40 PM   Specimen: Nasal Mucosa; Nasal Swab  Result Value Ref Range Status   MRSA by PCR Next Gen NOT DETECTED NOT DETECTED Final    Comment: (NOTE) The GeneXpert MRSA Assay (FDA approved for NASAL specimens only), is one component of Bayla Mcgovern comprehensive MRSA colonization surveillance program. It is not intended to diagnose MRSA infection nor to guide or monitor treatment for MRSA infections. Test performance is not FDA approved in patients less than 22 years old. Performed at [facility], [place] [address], [place], [place] [address]      Labs: Basic Metabolic Panel: Recent Labs  Lab 07/07/22 1905 07/07/22 2155 07/09/22 0137 07/09/22 0637 07/09/22 1423 07/09/22 2220 07/10/22 0543  NA  --    < > 140 143 137 137 139  K  --    < > 4.5 5.8* 3.8 3.9 3.9  CL  --    < > 107 113* 111 108 111  CO2  --    < > 21* 16* 20* 19* 21*  GLUCOSE  --    < > 218* 194* 190* 441* 318*  BUN  --    < > '12 14 14 12 9  ' CREATININE  --    < > 0.85 1.00 0.87 1.01* 0.89  CALCIUM  --    < > 9.5 9.1 9.1 8.7* 8.9  MG 2.4  --   --   --   --   --   --    < > = values in this interval not displayed.   Liver Function Tests: No results for input(s): "AST", "ALT", "ALKPHOS",  "BILITOT", "PROT", "ALBUMIN" in the last 168 hours. Recent Labs  Lab 07/07/22 1843  LIPASE 148*   No results for input(s): "AMMONIA" in the last 168 hours. CBC: Recent Labs  Lab 07/07/22 1843 07/07/22 1901 07/08/22 0423  WBC 15.7*  --  17.4*  NEUTROABS 12.2*  --   --   HGB 16.2* 17.7* 13.9  HCT 49.1* 52.0* 42.4  MCV 80.6  --  81.4  PLT 367  --  245   Cardiac Enzymes: Recent Labs  Lab 07/09/22 0137  CKTOTAL 64   BNP: BNP (last 3 results) No results for input(s): "BNP" in the last 8760  hours.  ProBNP (last 3 results) Recent Labs    05/22/22 1006 06/05/22 0831  PROBNP 951* 726*    CBG: Recent Labs  Lab 07/09/22 1157 07/09/22 1645 07/09/22 2056 07/10/22 0633 07/10/22 1205  GLUCAP 256* 173* 326* 277* 219*       Signed:  Fayrene Helper MD.  [facility] 07/10/2022, 1:56 PM

## 2022-07-17 ENCOUNTER — Encounter (HOSPITAL_COMMUNITY): Payer: Self-pay

## 2022-07-17 ENCOUNTER — Other Ambulatory Visit: Payer: Self-pay | Admitting: Home Modifications

## 2022-07-17 ENCOUNTER — Emergency Department (HOSPITAL_COMMUNITY): Payer: Medicare HMO

## 2022-07-17 ENCOUNTER — Other Ambulatory Visit: Payer: Self-pay

## 2022-07-17 ENCOUNTER — Emergency Department (HOSPITAL_COMMUNITY)
Admission: EM | Admit: 2022-07-17 | Discharge: 2022-07-17 | Disposition: A | Discharge status: Home / Self Care | Payer: Medicare HMO | Attending: Emergency Medicine | Admitting: Emergency Medicine

## 2022-07-17 DIAGNOSIS — T502X5A Adverse effect of carbonic-anhydrase inhibitors, benzothiadiazides and other diuretics, initial encounter: Secondary | ICD-10-CM | POA: Diagnosis not present

## 2022-07-17 DIAGNOSIS — I10 Essential (primary) hypertension: Secondary | ICD-10-CM | POA: Insufficient documentation

## 2022-07-17 DIAGNOSIS — D72829 Elevated white blood cell count, unspecified: Secondary | ICD-10-CM | POA: Insufficient documentation

## 2022-07-17 DIAGNOSIS — Z7982 Long term (current) use of aspirin: Secondary | ICD-10-CM | POA: Diagnosis not present

## 2022-07-17 DIAGNOSIS — E236 Other disorders of pituitary gland: Secondary | ICD-10-CM | POA: Insufficient documentation

## 2022-07-17 DIAGNOSIS — E876 Hypokalemia: Secondary | ICD-10-CM | POA: Diagnosis not present

## 2022-07-17 DIAGNOSIS — R4182 Altered mental status, unspecified: Secondary | ICD-10-CM | POA: Diagnosis present

## 2022-07-17 DIAGNOSIS — R400 Somnolence: Secondary | ICD-10-CM | POA: Insufficient documentation

## 2022-07-17 DIAGNOSIS — I2510 Atherosclerotic heart disease of native coronary artery without angina pectoris: Secondary | ICD-10-CM | POA: Insufficient documentation

## 2022-07-17 DIAGNOSIS — E042 Nontoxic multinodular goiter: Secondary | ICD-10-CM

## 2022-07-17 DIAGNOSIS — E785 Hyperlipidemia, unspecified: Secondary | ICD-10-CM | POA: Diagnosis not present

## 2022-07-17 LAB — URINALYSIS, ROUTINE W REFLEX MICROSCOPIC
Bilirubin Urine: NEGATIVE
Glucose, UA: >=500 mg/dL — AB
Hgb urine dipstick: NEGATIVE
Ketones, ur: NEGATIVE mg/dL
Leukocytes,Ua: NEGATIVE
Nitrite: NEGATIVE
Protein, ur: NEGATIVE mg/dL
Specific Gravity, Urine: 1.012 (ref 1.005–1.030)
pH: 6 (ref 5.0–8.0)

## 2022-07-17 LAB — CBC WITH DIFFERENTIAL/PLATELET
Abs Immature Granulocytes: 0.06 10*3/uL (ref 0.00–0.07)
Basophils Absolute: 0 10*3/uL (ref 0.0–0.1)
Basophils Relative: 0 %
Eosinophils Absolute: 0.2 10*3/uL (ref 0.0–0.5)
Eosinophils Relative: 2 %
HCT: 40.7 % (ref 36.0–46.0)
Hemoglobin: 13 g/dL (ref 12.0–15.0)
Immature Granulocytes: 1 %
Lymphocytes Relative: 26 %
Lymphs Abs: 2.9 10*3/uL (ref 0.7–4.0)
MCH: 26.7 pg (ref 26.0–34.0)
MCHC: 31.9 g/dL (ref 30.0–36.0)
MCV: 83.7 fL (ref 80.0–100.0)
Monocytes Absolute: 0.6 10*3/uL (ref 0.1–1.0)
Monocytes Relative: 6 %
Neutro Abs: 7.3 10*3/uL (ref 1.7–7.7)
Neutrophils Relative %: 65 %
Platelets: 240 10*3/uL (ref 150–400)
RBC: 4.86 MIL/uL (ref 3.87–5.11)
RDW: 15 % (ref 11.5–15.5)
WBC: 11.1 10*3/uL — ABNORMAL HIGH (ref 4.0–10.5)
nRBC: 0 % (ref 0.0–0.2)

## 2022-07-17 LAB — COMPREHENSIVE METABOLIC PANEL
ALT: 28 U/L (ref 0–44)
AST: 20 U/L (ref 15–41)
Albumin: 3 g/dL — ABNORMAL LOW (ref 3.5–5.0)
Alkaline Phosphatase: 117 U/L (ref 38–126)
Anion gap: 10 (ref 5–15)
BUN: 8 mg/dL (ref 6–20)
CO2: 22 mmol/L (ref 22–32)
Calcium: 9 mg/dL (ref 8.9–10.3)
Chloride: 107 mmol/L (ref 98–111)
Creatinine, Ser: 0.84 mg/dL (ref 0.44–1.00)
GFR, Estimated: >60 mL/min (ref >60)
Glucose, Bld: 180 mg/dL — ABNORMAL HIGH (ref 70–99)
Potassium: 3.2 mmol/L — ABNORMAL LOW (ref 3.5–5.1)
Sodium: 139 mmol/L (ref 135–145)
Total Bilirubin: 0.3 mg/dL (ref 0.3–1.2)
Total Protein: 6.2 g/dL — ABNORMAL LOW (ref 6.5–8.1)

## 2022-07-17 LAB — RAPID URINE DRUG SCREEN, HOSP PERFORMED
Amphetamines: NOT DETECTED
Barbiturates: NOT DETECTED
Benzodiazepines: NOT DETECTED
Cocaine: NOT DETECTED
Opiates: NOT DETECTED
Tetrahydrocannabinol: POSITIVE — AB

## 2022-07-17 LAB — ETHANOL: Alcohol, Ethyl (B): <10 mg/dL (ref <10)

## 2022-07-17 LAB — I-STAT BETA HCG BLOOD, ED (MC, WL, AP ONLY): I-stat hCG, quantitative: 5.3 m[IU]/mL — ABNORMAL HIGH (ref <5)

## 2022-07-17 LAB — CBG MONITORING, ED: Glucose-Capillary: 183 mg/dL — ABNORMAL HIGH (ref 70–99)

## 2022-07-17 MED ORDER — POTASSIUM CHLORIDE 10 MEQ/100ML IV SOLN: 10.0000 meq | Freq: Once | INTRAVENOUS | Status: DC

## 2022-07-17 MED ORDER — NALOXONE HCL 2 MG/2ML IJ SOSY
2.0000 mg | PREFILLED_SYRINGE | Freq: Once | INTRAMUSCULAR | Status: AC
  Administered 2022-07-17: 2 mg via INTRAVENOUS
  Filled 2022-07-17: qty 2

## 2022-07-17 MED ORDER — POTASSIUM CHLORIDE CRYS ER 20 MEQ PO TBCR
40.0000 meq | EXTENDED_RELEASE_TABLET | Freq: Once | ORAL | Status: AC
  Administered 2022-07-17: 40 meq via ORAL
  Filled 2022-07-17: qty 2

## 2022-07-17 MED ORDER — POTASSIUM CHLORIDE 10 MEQ/100ML IV SOLN
10.0000 meq | INTRAVENOUS | Status: DC
  Administered 2022-07-17: 10 meq via INTRAVENOUS
  Filled 2022-07-17: qty 100

## 2022-07-17 MED ORDER — POTASSIUM CHLORIDE CRYS ER 20 MEQ PO TBCR: EXTENDED_RELEASE_TABLET | ORAL | 0 refills | Status: DC

## 2022-07-17 NOTE — Discharge Instructions (Addendum)
Stop taking amitriptyline - it was too strong for you.  Please discuss with your primary care provider alternatives for sleep.  Or, you may just need to take a lower dose of the amitriptyline.  You potassium was low today in spite of you are taking potassium pills.  I am giving you a new prescription for a higher dose of the potassium.

## 2022-07-17 NOTE — ED Triage Notes (Signed)
Pt BIB EMS from home bc pt's brother thought she was too sleepy. Pt had a few falls today and denied hitting her head.Pt did not want to come, but she would not stay up long enough for ems to get her back in the house. Pt took trazodone tonight.  109/70 70s hr 16 rr 99% 219 cbg

## 2022-07-17 NOTE — ED Notes (Signed)
Pt transported to CT ?

## 2022-07-17 NOTE — ED Notes (Signed)
Pt states understanding of dc instructions, importance of follow up, and prescription. Pt denies questions or concerns upon dc. Pt declined wheelchair assistance upon dc. Pt ambulated out of ed w/ steady gait. No belongings left in room upon dc.  

## 2022-07-17 NOTE — ED Notes (Signed)
Pt ambulated to the bathroom w/o assistance.   

## 2022-07-17 NOTE — ED Provider Notes (Signed)
[facility]-EMERGENCY DEPT Provider Note   CSN: [id number] Arrival date & time: 07/17/22  9211     History  No chief complaint on file.   Stephanie Thornton is a 51 y.o. female.  The history is provided by the EMS personnel. The history is limited by the condition of the patient (Altered mental status).  She has history of hypertension, diabetes, hyperlipidemia, coronary artery disease, bipolar disorder and is brought in by ambulance because of somnolence.  Per EMS, patient's brother thought that she was too sleepy at home.  She had fallen twice during the day, but apparently did not hit her head.  Patient had told EMS that she took trazodone for sleep tonight.  Patient cannot give any history on arrival in the ED.   Home Medications Prior to Admission medications   Medication Sig Start Date End Date Taking? Authorizing Provider  Acetaminophen (TYLENOL PO) Take 2 tablets by mouth 3 (three) times daily as needed (tooth ache).    [provider]  albuterol (PROVENTIL HFA;VENTOLIN HFA) 108 (90 Base) MCG/ACT inhaler Inhale 1-2 puffs into the lungs every 6 (six) hours as needed for wheezing or shortness of breath. Patient taking differently: Inhale 2 puffs into the lungs as needed for shortness of breath. 03/04/19   Grayce Sessions, NP  amitriptyline (ELAVIL) 150 MG tablet Take 150 mg by mouth at bedtime. 01/28/19   [provider]  amLODipine (NORVASC) 10 MG tablet Take 1 tablet (10 mg total) by mouth daily. 05/09/22   Cyndi Bender, NP  aspirin 81 MG chewable tablet Chew 1 tablet (81 mg total) by mouth daily. 05/09/22   Cyndi Bender, NP  baclofen (LIORESAL) 10 MG tablet Take 10 mg by mouth 3 (three) times daily. 05/05/22   [provider]  busPIRone (BUSPAR) 10 MG tablet Take 10 mg by mouth 3 (three) times daily. 01/25/19   [provider]  furosemide (LASIX) 20 MG tablet Take 2 tablets by mouth daily X's 3 days then reduce to 1 tablet by mouth  daily. Patient taking differently: Take 20 mg by mouth 2 (two) times daily. 05/29/22   Tereso Newcomer T, PA-C  gabapentin (NEURONTIN) 300 MG capsule Take 1 capsule (300 mg total) by mouth 3 (three) times daily. 06/29/19   Grayce Sessions, NP  LATUDA 80 MG TABS tablet Take 80 mg by mouth every evening. 02/01/19   [provider]  metoprolol tartrate (LOPRESSOR) 25 MG tablet Take 1 tablet (25 mg total) by mouth 2 (two) times daily. 05/09/22   Cyndi Bender, NP  naloxone [facility]) nasal spray 4 mg/0.1 mL Place 0.4 mg into the nose once. 05/24/22   [provider]  nitroGLYCERIN (NITROSTAT) 0.4 MG SL tablet Place 1 tablet (0.4 mg total) under the tongue every 5 (five) minutes x 3 doses as needed for chest pain. 05/09/22   Cyndi Bender, NP  NOVOLOG 100 UNIT/ML injection Inject 5-22 Units into the skin in the morning, at noon, in the evening, and at bedtime. 11/21/20   [provider]  Olmesartan-amLODIPine-HCTZ 40-10-25 MG TABS Take 1 tablet by mouth daily. 06/28/22   [provider]  Oxycodone HCl 10 MG TABS Take 10 mg by mouth 4 (four) times daily as needed. 07/08/22   [provider]  pantoprazole (PROTONIX) 40 MG tablet Take 1 tablet (40 mg total) by mouth daily. 07/10/22 07/10/23  Zigmund Daniel., MD  potassium chloride (KLOR-CON) 10 MEQ tablet Take 2 tablets by mouth daily  X's 3 days then reduce to 1 tablet by mouth daily. Patient taking differently: Take 10 mEq by mouth daily. 05/29/22   Tereso Newcomer T, PA-C  rosuvastatin (CRESTOR) 20 MG tablet Take 20 mg by mouth at bedtime. 06/27/22   [provider]  ticagrelor (BRILINTA) 90 MG TABS tablet Take 1 tablet (90 mg total) by mouth 2 (two) times daily. Patient not taking: Reported on 07/08/2022 05/09/22   Cyndi Bender, NP  topiramate (TOPAMAX) 200 MG tablet Take 200 mg by mouth at bedtime. 01/28/19   [provider]  traZODone (DESYREL) 100 MG tablet Take 200 mg by mouth at bedtime. 02/24/19    [provider]      Allergies    Lyrica [pregabalin], Rocephin [ceftriaxone sodium in dextrose], Sulfa antibiotics, Ultram [tramadol], Vancomycin, Glucotrol [glipizide], Keflex [cephalexin], Penicillins, and Janumet [sitagliptin-metformin hcl]    Review of Systems   Review of Systems  Unable to perform ROS: Mental status change    Physical Exam Updated Vital Signs There were no vitals taken for this visit. Physical Exam Vitals and nursing note reviewed.   51 year old female, somnolent but does respond to painful stimuli, and is in no acute distress. Vital signs are significant for slow respiratory rate. Oxygen saturation is 96%, which is normal. Head is normocephalic and atraumatic.  Pupils are 3 mm and sluggishly reactive. Oropharynx is clear. Neck is nontender and supple without adenopathy or JVD. Back is nontender and there is no CVA tenderness. Lungs are clear without rales, wheezes, or rhonchi. Chest is nontender. Heart has regular rate and rhythm without murmur. Abdomen is soft, flat, nontender. Extremities have no cyanosis or edema, full range of motion is present. Skin is warm and dry without rash. Neurologic: Somnolent, responds to painful stimuli but not verbal stimuli, moves all extremities symmetrically in response to pain.  ED Results / Procedures / Treatments   Labs (all labs ordered are listed, but only abnormal results are displayed) Labs Reviewed  CBG MONITORING, ED - Abnormal; Notable for the following components:      Result Value   Glucose-Capillary 183 (*)    All other components within normal limits  COMPREHENSIVE METABOLIC PANEL  ETHANOL  CBC WITH DIFFERENTIAL/PLATELET  URINALYSIS, ROUTINE W REFLEX MICROSCOPIC  I-STAT BETA HCG BLOOD, ED ([facility], [facility], [facility] ONLY)    EKG EKG Interpretation  Date/Time:  Wednesday July 17 2022 03:50:06 EDT Ventricular Rate:  67 PR Interval:  170 QRS Duration: 85 QT Interval:  426 QTC Calculation: 450 R  Axis:   -13 Text Interpretation: Sinus rhythm Low voltage, precordial leads Nonspecific T abnormalities, lateral leads When compared with ECG of 07/07/2022, HEART RATE has decreased Confirmed by Dione Booze ([id number]) on 07/17/2022 3:55:11 AM  Radiology No results found.  Procedures Procedures  Cardiac monitor shows normal sinus rhythm, per my interpretation.  Medications Ordered in ED Medications - No data to display  ED Course/ Medical Decision Making/ A&P                           Medical Decision Making Amount and/or Complexity of Data Reviewed Labs: ordered. Radiology: ordered.  Risk Prescription drug management.   Altered mental Status.  Differential diagnosis includes, but is not limited to, opioid overdose, other sedative medication overdose, hypoglycemia closed head injury, ketoacidosis, other metabolic derangements.  Old records reviewed, and she had a similar presentation to the ED on 05/11/2022 with no definite diagnosis having been made.  I have ordered a dose of naloxone in case the patient had accidentally overdosed on opioids.  She had no response to the naloxone.  I have ordered labs including CBC, comprehensive metabolic panel, ethanol level,, urinalysis.  I have also ordered a CT of the head to look for evidence of intracranial injury.  CT of head shows no acute process.  I have independently viewed the images, and agree with radiologist's interpretation.  I have reviewed and interpreted her laboratory tests and my interpretation is mild leukocytosis which is nonspecific, hypokalemia, mildly elevated glucose with normal CO2 and anion gap, glycosuria, undetectable ethanol.  Patient was reevaluated multiple times and continued to be only minimally responsive although she continued to maintain her airway and maintain adequate oxygen saturation on room air.  I have ordered intravenous potassium.  As intravenous potassium was started, patient woke up and is normally conversant.   She states that she took a dose of amitriptyline to help her sleep.  She seems to have returned to baseline, I have ordered a dose of oral potassium.  I had considered hospitalization, but since she has returned to her baseline mental status, I feel comfortable sending her home with prescription for oral potassium and instructions to no longer take amitriptyline.  Final Clinical Impression(s) / ED Diagnoses Final diagnoses:  Somnolence  Diuretic-induced hypokalemia    Rx / DC Orders ED Discharge Orders          Ordered    potassium chloride SA (KLOR-CON M) 20 MEQ tablet        07/17/22 3810              Dione Booze, MD 07/17/22 [phone]

## 2022-08-13 ENCOUNTER — Other Ambulatory Visit (HOSPITAL_COMMUNITY): Payer: Self-pay

## 2022-08-19 ENCOUNTER — Other Ambulatory Visit (HOSPITAL_COMMUNITY): Payer: Self-pay

## 2022-08-21 ENCOUNTER — Ambulatory Visit
Admission: RE | Admit: 2022-08-21 | Discharge: 2022-08-21 | Disposition: A | Discharge status: Home / Self Care | Payer: Medicare HMO | Source: Ambulatory Visit | Attending: Home Modifications | Admitting: Home Modifications

## 2022-08-21 ENCOUNTER — Other Ambulatory Visit (HOSPITAL_COMMUNITY)
Admission: RE | Admit: 2022-08-21 | Discharge: 2022-08-21 | Disposition: A | Discharge status: Home / Self Care | Payer: Medicare HMO | Source: Ambulatory Visit | Attending: Physician Assistant | Admitting: Physician Assistant

## 2022-08-21 ENCOUNTER — Other Ambulatory Visit: Payer: Medicare HMO

## 2022-08-21 DIAGNOSIS — E042 Nontoxic multinodular goiter: Secondary | ICD-10-CM

## 2022-08-21 DIAGNOSIS — E041 Nontoxic single thyroid nodule: Secondary | ICD-10-CM | POA: Insufficient documentation

## 2022-08-23 ENCOUNTER — Other Ambulatory Visit: Payer: Medicare HMO

## 2022-08-26 LAB — CYTOLOGY - NON PAP

## 2022-08-27 ENCOUNTER — Emergency Department (HOSPITAL_COMMUNITY): Payer: Medicare HMO

## 2022-08-27 ENCOUNTER — Other Ambulatory Visit: Payer: Self-pay

## 2022-08-27 ENCOUNTER — Observation Stay (HOSPITAL_COMMUNITY)
Admission: EM | Admit: 2022-08-27 | Discharge: 2022-08-28 | Disposition: A | Discharge status: Home / Self Care | Payer: Medicare HMO | Attending: Student | Admitting: Student

## 2022-08-27 ENCOUNTER — Encounter (HOSPITAL_COMMUNITY): Payer: Self-pay

## 2022-08-27 DIAGNOSIS — Z8673 Personal history of transient ischemic attack (TIA), and cerebral infarction without residual deficits: Secondary | ICD-10-CM | POA: Insufficient documentation

## 2022-08-27 DIAGNOSIS — Z955 Presence of coronary angioplasty implant and graft: Secondary | ICD-10-CM | POA: Diagnosis not present

## 2022-08-27 DIAGNOSIS — E1110 Type 2 diabetes mellitus with ketoacidosis without coma: Secondary | ICD-10-CM

## 2022-08-27 DIAGNOSIS — Z794 Long term (current) use of insulin: Secondary | ICD-10-CM | POA: Diagnosis not present

## 2022-08-27 DIAGNOSIS — D72829 Elevated white blood cell count, unspecified: Secondary | ICD-10-CM | POA: Diagnosis not present

## 2022-08-27 DIAGNOSIS — E1165 Type 2 diabetes mellitus with hyperglycemia: Secondary | ICD-10-CM | POA: Diagnosis not present

## 2022-08-27 DIAGNOSIS — G4733 Obstructive sleep apnea (adult) (pediatric): Secondary | ICD-10-CM | POA: Diagnosis present

## 2022-08-27 DIAGNOSIS — F32A Depression, unspecified: Secondary | ICD-10-CM | POA: Diagnosis present

## 2022-08-27 DIAGNOSIS — F17210 Nicotine dependence, cigarettes, uncomplicated: Secondary | ICD-10-CM | POA: Diagnosis not present

## 2022-08-27 DIAGNOSIS — E782 Mixed hyperlipidemia: Secondary | ICD-10-CM

## 2022-08-27 DIAGNOSIS — I2510 Atherosclerotic heart disease of native coronary artery without angina pectoris: Secondary | ICD-10-CM | POA: Diagnosis present

## 2022-08-27 DIAGNOSIS — E876 Hypokalemia: Secondary | ICD-10-CM | POA: Diagnosis present

## 2022-08-27 DIAGNOSIS — R0789 Other chest pain: Secondary | ICD-10-CM | POA: Diagnosis not present

## 2022-08-27 DIAGNOSIS — Z79899 Other long term (current) drug therapy: Secondary | ICD-10-CM | POA: Insufficient documentation

## 2022-08-27 DIAGNOSIS — D7389 Other diseases of spleen: Secondary | ICD-10-CM

## 2022-08-27 DIAGNOSIS — F319 Bipolar disorder, unspecified: Secondary | ICD-10-CM | POA: Diagnosis present

## 2022-08-27 DIAGNOSIS — R079 Chest pain, unspecified: Secondary | ICD-10-CM | POA: Diagnosis present

## 2022-08-27 DIAGNOSIS — E875 Hyperkalemia: Secondary | ICD-10-CM | POA: Diagnosis not present

## 2022-08-27 DIAGNOSIS — K2100 Gastro-esophageal reflux disease with esophagitis, without bleeding: Secondary | ICD-10-CM

## 2022-08-27 DIAGNOSIS — Z7982 Long term (current) use of aspirin: Secondary | ICD-10-CM | POA: Diagnosis not present

## 2022-08-27 DIAGNOSIS — E785 Hyperlipidemia, unspecified: Secondary | ICD-10-CM

## 2022-08-27 DIAGNOSIS — I10 Essential (primary) hypertension: Secondary | ICD-10-CM

## 2022-08-27 HISTORY — DX: Atherosclerotic heart disease of native coronary artery without angina pectoris: I25.10

## 2022-08-27 HISTORY — DX: Hyperlipidemia, unspecified: E78.5

## 2022-08-27 LAB — HEPATIC FUNCTION PANEL
ALT: 22 U/L (ref 0–44)
AST: 15 U/L (ref 15–41)
Albumin: 4.2 g/dL (ref 3.5–5.0)
Alkaline Phosphatase: 165 U/L — ABNORMAL HIGH (ref 38–126)
Bilirubin, Direct: <0.1 mg/dL (ref 0.0–0.2)
Total Bilirubin: 0.4 mg/dL (ref 0.3–1.2)
Total Protein: 8.2 g/dL — ABNORMAL HIGH (ref 6.5–8.1)

## 2022-08-27 LAB — BASIC METABOLIC PANEL
Anion gap: 11 (ref 5–15)
BUN: 7 mg/dL (ref 6–20)
CO2: 22 mmol/L (ref 22–32)
Calcium: 9.7 mg/dL (ref 8.9–10.3)
Chloride: 103 mmol/L (ref 98–111)
Creatinine, Ser: 0.89 mg/dL (ref 0.44–1.00)
GFR, Estimated: >60 mL/min (ref >60)
Glucose, Bld: 325 mg/dL — ABNORMAL HIGH (ref 70–99)
Potassium: 2.8 mmol/L — ABNORMAL LOW (ref 3.5–5.1)
Sodium: 136 mmol/L (ref 135–145)

## 2022-08-27 LAB — CBG MONITORING, ED: Glucose-Capillary: 233 mg/dL — ABNORMAL HIGH (ref 70–99)

## 2022-08-27 LAB — CBC
HCT: 48.1 % — ABNORMAL HIGH (ref 36.0–46.0)
Hemoglobin: 15.6 g/dL — ABNORMAL HIGH (ref 12.0–15.0)
MCH: 26.5 pg (ref 26.0–34.0)
MCHC: 32.4 g/dL (ref 30.0–36.0)
MCV: 81.8 fL (ref 80.0–100.0)
Platelets: 297 10*3/uL (ref 150–400)
RBC: 5.88 MIL/uL — ABNORMAL HIGH (ref 3.87–5.11)
RDW: 15.9 % — ABNORMAL HIGH (ref 11.5–15.5)
WBC: 12 10*3/uL — ABNORMAL HIGH (ref 4.0–10.5)
nRBC: 0 % (ref 0.0–0.2)

## 2022-08-27 LAB — TROPONIN I (HIGH SENSITIVITY)
Troponin I (High Sensitivity): 15 ng/L (ref <18)
Troponin I (High Sensitivity): 16 ng/L (ref <18)
Troponin I (High Sensitivity): 21 ng/L — ABNORMAL HIGH (ref <18)
Troponin I (High Sensitivity): 17 ng/L (ref <18)

## 2022-08-27 LAB — GLUCOSE, CAPILLARY: Glucose-Capillary: 197 mg/dL — ABNORMAL HIGH (ref 70–99)

## 2022-08-27 LAB — LIPASE, BLOOD: Lipase: 24 U/L (ref 11–51)

## 2022-08-27 LAB — MAGNESIUM: Magnesium: 1.8 mg/dL (ref 1.7–2.4)

## 2022-08-27 MED ORDER — INSULIN ASPART 100 UNIT/ML IJ SOLN
0.0000 [IU] | Freq: Three times a day (TID) | INTRAMUSCULAR | Status: DC
  Administered 2022-08-28: 5 [IU] via SUBCUTANEOUS

## 2022-08-27 MED ORDER — NITROGLYCERIN 2 % TD OINT
1.0000 [in_us] | TOPICAL_OINTMENT | Freq: Once | TRANSDERMAL | Status: AC
  Administered 2022-08-27: 1 [in_us] via TOPICAL
  Filled 2022-08-27: qty 1

## 2022-08-27 MED ORDER — AMLODIPINE BESYLATE 10 MG PO TABS
10.0000 mg | ORAL_TABLET | Freq: Every day | ORAL | Status: DC
  Administered 2022-08-27 – 2022-08-28 (×2): 10 mg via ORAL
  Filled 2022-08-27: qty 1
  Filled 2022-08-27: qty 2

## 2022-08-27 MED ORDER — INSULIN GLARGINE-YFGN 100 UNIT/ML ~~LOC~~ SOLN
10.0000 [IU] | Freq: Every day | SUBCUTANEOUS | Status: DC
Start: 2022-08-28 — End: 2022-08-28
  Filled 2022-08-27: qty 0.1

## 2022-08-27 MED ORDER — LACTATED RINGERS IV BOLUS
1000.0000 mL | Freq: Once | INTRAVENOUS | Status: AC
  Administered 2022-08-27: 1000 mL via INTRAVENOUS

## 2022-08-27 MED ORDER — INSULIN ASPART 100 UNIT/ML IJ SOLN: 0.0000 [IU] | Freq: Every day | INTRAMUSCULAR | Status: DC

## 2022-08-27 MED ORDER — IOHEXOL 350 MG/ML SOLN
100.0000 mL | Freq: Once | INTRAVENOUS | Status: AC | PRN
  Administered 2022-08-27: 100 mL via INTRAVENOUS

## 2022-08-27 MED ORDER — FUROSEMIDE 20 MG PO TABS
20.0000 mg | ORAL_TABLET | Freq: Every day | ORAL | Status: DC
  Administered 2022-08-28: 20 mg via ORAL
  Filled 2022-08-27: qty 1

## 2022-08-27 MED ORDER — GABAPENTIN 300 MG PO CAPS
300.0000 mg | ORAL_CAPSULE | Freq: Three times a day (TID) | ORAL | Status: DC
  Administered 2022-08-28 (×2): 300 mg via ORAL
  Filled 2022-08-27 (×2): qty 1

## 2022-08-27 MED ORDER — LIDOCAINE VISCOUS HCL 2 % MT SOLN
15.0000 mL | Freq: Once | OROMUCOSAL | Status: AC
  Administered 2022-08-27: 15 mL via ORAL
  Filled 2022-08-27: qty 15

## 2022-08-27 MED ORDER — AMITRIPTYLINE HCL 50 MG PO TABS
150.0000 mg | ORAL_TABLET | Freq: Every day | ORAL | Status: DC
  Administered 2022-08-28: 150 mg via ORAL
  Filled 2022-08-27 (×2): qty 3

## 2022-08-27 MED ORDER — POTASSIUM CHLORIDE 10 MEQ/100ML IV SOLN
10.0000 meq | INTRAVENOUS | Status: AC
  Administered 2022-08-27 (×3): 10 meq via INTRAVENOUS
  Filled 2022-08-27 (×4): qty 100

## 2022-08-27 MED ORDER — TOPIRAMATE 100 MG PO TABS
200.0000 mg | ORAL_TABLET | Freq: Every day | ORAL | Status: DC
  Administered 2022-08-28: 200 mg via ORAL
  Filled 2022-08-27 (×2): qty 2

## 2022-08-27 MED ORDER — POTASSIUM CHLORIDE CRYS ER 20 MEQ PO TBCR
40.0000 meq | EXTENDED_RELEASE_TABLET | Freq: Once | ORAL | Status: AC
  Administered 2022-08-27: 40 meq via ORAL
  Filled 2022-08-27: qty 2

## 2022-08-27 MED ORDER — ALBUTEROL SULFATE (2.5 MG/3ML) 0.083% IN NEBU: 3.0000 mL | INHALATION_SOLUTION | Freq: Four times a day (QID) | RESPIRATORY_TRACT | Status: DC | PRN

## 2022-08-27 MED ORDER — FAMOTIDINE IN NACL 20-0.9 MG/50ML-% IV SOLN
20.0000 mg | Freq: Once | INTRAVENOUS | Status: AC
  Administered 2022-08-27: 20 mg via INTRAVENOUS
  Filled 2022-08-27: qty 50

## 2022-08-27 MED ORDER — ALUM & MAG HYDROXIDE-SIMETH 200-200-20 MG/5ML PO SUSP
30.0000 mL | Freq: Once | ORAL | Status: AC
  Administered 2022-08-27: 30 mL via ORAL
  Filled 2022-08-27: qty 30

## 2022-08-27 MED ORDER — FENTANYL CITRATE PF 50 MCG/ML IJ SOSY
50.0000 ug | PREFILLED_SYRINGE | Freq: Once | INTRAMUSCULAR | Status: AC
  Administered 2022-08-27: 50 ug via INTRAVENOUS
  Filled 2022-08-27: qty 1

## 2022-08-27 MED ORDER — LURASIDONE HCL 40 MG PO TABS
80.0000 mg | ORAL_TABLET | Freq: Every evening | ORAL | Status: DC
  Administered 2022-08-28: 80 mg via ORAL
  Filled 2022-08-27 (×2): qty 2

## 2022-08-27 MED ORDER — BACLOFEN 10 MG PO TABS
10.0000 mg | ORAL_TABLET | Freq: Three times a day (TID) | ORAL | Status: DC
  Administered 2022-08-28 (×2): 10 mg via ORAL
  Filled 2022-08-27 (×2): qty 1

## 2022-08-27 MED ORDER — METOPROLOL TARTRATE 25 MG PO TABS
25.0000 mg | ORAL_TABLET | Freq: Two times a day (BID) | ORAL | Status: DC
  Administered 2022-08-27 – 2022-08-28 (×3): 25 mg via ORAL
  Filled 2022-08-27 (×3): qty 1

## 2022-08-27 MED ORDER — MAGNESIUM SULFATE IN D5W 1-5 GM/100ML-% IV SOLN
1.0000 g | Freq: Once | INTRAVENOUS | Status: AC
  Administered 2022-08-27: 1 g via INTRAVENOUS
  Filled 2022-08-27: qty 100

## 2022-08-27 NOTE — ED Notes (Signed)
RN Candise Bowens attempted IV insertion. Not able to obtain access.

## 2022-08-27 NOTE — ED Notes (Signed)
Pt notified staff about sudden onset of 10/10 L side chest pain. EKG captured and provided to R.Dixion, MD.

## 2022-08-27 NOTE — ED Provider Notes (Signed)
I discussed the care with Dr. Thomes Dinning who will admit the patient to the hospital   Stephanie Hong, MD 08/27/22 1958

## 2022-08-27 NOTE — ED Notes (Signed)
Pt ambulating to rest room.

## 2022-08-27 NOTE — ED Triage Notes (Signed)
PT arrived from [facility] for onset of CP at 2200 on 08/26/2022. PT took 324 of ASA and three sublingual nitros with little to no relief. Pain in dull and achy radiating to the left chest and mid back.

## 2022-08-27 NOTE — ED Provider Notes (Signed)
[facility] EMERGENCY DEPARTMENT Provider Note   CSN: [id number] Arrival date & time: 08/27/22  0757     History  Chief Complaint  Patient presents with   Chest Pain    Started last night at 2200 when laying in bed, awoke from sleep radiating to back and left shoulder dull and aching. ASA 324 and 3 sublingual nitros taken, Little relief.    Stephanie Thornton is a 51 y.o. female.   Chest Pain Associated symptoms: diaphoresis, nausea and vomiting   Patient presents for chest pain. Her medical history includes HTN, DM, anxiety, arthritis, GERD, depression, hiatal hernia, PUD, OSA, CAD.  Return 4 of ASA and 3 SL NTG's taken prior to arrival.  Pain radiates to left chest and mid back.  She is followed by [facility].  Her cardiologist is Dr. Carolanne Grumbling.  She underwent LHC in May of this year.  At that time, she was found to have total occlusion of first obtuse marginal and 95% occlusion to LCx DES was placed in LCx.  She is prescribed ASA, Brilinta, amlodipine, Lasix, metoprolol.  She reports that she was in her normal state of health yesterday.  Last night at 10 PM, she began to experience left-sided chest pain.  She attributed this to indigestion and took some Pepto-Bismol with no relief.  Pain persisted through the night.  At 2 PM, she began to experience nausea and vomiting.  Pain and nausea persisted at 5 AM.  At that point, she took SL NTG's and 4 baby aspirin.  She does report minimal relief with NTG's.  At maximum, pain was 8/10 in severity.  On arrival, it is 6/10.  She does continue to endorse nausea.  She feels that she did get clammy at some point last night.  She denies any other associated symptoms.     Home Medications Prior to Admission medications   Medication Sig Start Date End Date Taking? Authorizing Provider  albuterol (PROVENTIL HFA;VENTOLIN HFA) 108 (90 Base) MCG/ACT inhaler Inhale 1-2 puffs into the lungs every 6 (six) hours as needed for wheezing or  shortness of breath. Patient taking differently: Inhale 2 puffs into the lungs as needed for shortness of breath. 03/04/19  Yes Grayce Sessions, NP  amitriptyline (ELAVIL) 150 MG tablet Take 150 mg by mouth at bedtime. 08/06/22  Yes [provider]  amLODipine (NORVASC) 10 MG tablet Take 1 tablet (10 mg total) by mouth daily. 05/09/22  Yes Cyndi Bender, NP  aspirin 81 MG chewable tablet Chew 1 tablet (81 mg total) by mouth daily. 05/09/22  Yes Cyndi Bender, NP  baclofen (LIORESAL) 10 MG tablet Take 10 mg by mouth 3 (three) times daily. 05/05/22  Yes [provider]  busPIRone (BUSPAR) 10 MG tablet Take 10 mg by mouth 3 (three) times daily. 01/25/19  Yes [provider]  Cholecalciferol (VITAMIN D3) 1.25 MG (50000 UT) CAPS Take 1 capsule by mouth once a week. 08/08/22  Yes [provider]  furosemide (LASIX) 20 MG tablet Take 2 tablets by mouth daily X's 3 days then reduce to 1 tablet by mouth daily. Patient taking differently: Take 20 mg by mouth daily. 05/29/22  Yes Weaver, Scott T, PA-C  gabapentin (NEURONTIN) 300 MG capsule Take 1 capsule (300 mg total) by mouth 3 (three) times daily. 06/29/19  Yes Edwards, Michelle P, NP  LATUDA 80 MG TABS tablet Take 80 mg by mouth every evening. 02/01/19  Yes [provider]  metoprolol tartrate (LOPRESSOR) 25  MG tablet Take 1 tablet (25 mg total) by mouth 2 (two) times daily. 05/09/22  Yes Cyndi Bender, NP  nitroGLYCERIN (NITROSTAT) 0.4 MG SL tablet Place 1 tablet (0.4 mg total) under the tongue every 5 (five) minutes x 3 doses as needed for chest pain. 05/09/22  Yes Cyndi Bender, NP  NOVOLOG FLEXPEN 100 UNIT/ML FlexPen Inject 10-15 Units into the skin 3 (three) times daily as needed for high blood sugar. Sliding Scale 08/12/22  Yes [provider]  Oxycodone HCl 10 MG TABS Take 10 mg by mouth 4 (four) times daily as needed (pain). 07/08/22  Yes [provider]  pantoprazole (PROTONIX) 40 MG tablet Take 1 tablet (40 mg  total) by mouth daily. 07/10/22 07/10/23 Yes Zigmund Daniel., MD  TRESIBA FLEXTOUCH 100 UNIT/ML FlexTouch Pen Inject 12 Units into the skin at bedtime. 07/12/22  Yes [provider]  Acetaminophen (TYLENOL PO) Take 2 tablets by mouth 3 (three) times daily as needed (tooth ache).    [provider]  potassium chloride SA (KLOR-CON M) 20 MEQ tablet Take one tablet twice a day for five days, then take  one tablet every day. 07/17/22   Dione Booze, MD  rosuvastatin (CRESTOR) 10 MG tablet Take 10 mg by mouth at bedtime. 08/10/22   [provider]  ticagrelor (BRILINTA) 90 MG TABS tablet Take 1 tablet (90 mg total) by mouth 2 (two) times daily. Patient not taking: Reported on 07/08/2022 05/09/22   Cyndi Bender, NP  topiramate (TOPAMAX) 200 MG tablet Take 200 mg by mouth at bedtime. 01/28/19   [provider]  traZODone (DESYREL) 100 MG tablet Take 200 mg by mouth at bedtime. 02/24/19   [provider]      Allergies    Lyrica [pregabalin], Rocephin [ceftriaxone sodium in dextrose], Sulfa antibiotics, Ultram [tramadol], Vancomycin, Glucotrol [glipizide], Keflex [cephalexin], Penicillins, and Janumet [sitagliptin-metformin hcl]    Review of Systems   Review of Systems  Constitutional:  Positive for diaphoresis.  Cardiovascular:  Positive for chest pain.  Gastrointestinal:  Positive for nausea and vomiting.  All other systems reviewed and are negative.   Physical Exam Updated Vital Signs BP 121/85   Pulse 76   Temp 98.9 F (37.2 C)   Resp 16   Ht 5\' 1"  (1.549 m)   Wt 66.7 kg   SpO2 100%   BMI 27.78 kg/m  Physical Exam Vitals and nursing note reviewed.  Constitutional:      General: She is not in acute distress.    Appearance: She is well-developed.  HENT:     Head: Normocephalic and atraumatic.  Eyes:     Extraocular Movements: Extraocular movements intact.     Conjunctiva/sclera: Conjunctivae normal.  Neck:     Vascular: No JVD.   Cardiovascular:     Rate and Rhythm: Regular rhythm. Tachycardia present.     Heart sounds: No murmur heard.    No friction rub. No gallop.  Pulmonary:     Effort: Pulmonary effort is normal. No tachypnea or respiratory distress.     Breath sounds: Normal breath sounds. No decreased breath sounds, wheezing, rhonchi or rales.  Chest:     Chest wall: Tenderness present.  Abdominal:     Palpations: Abdomen is soft.     Tenderness: There is no abdominal tenderness.  Musculoskeletal:        General: No swelling. Normal range of motion.     Cervical back: Normal range of motion and neck supple.  Right lower leg: No edema.     Left lower leg: No edema.  Skin:    General: Skin is warm and dry.     Coloration: Skin is not cyanotic or pale.  Neurological:     General: No focal deficit present.     Mental Status: She is alert and oriented to person, place, and time.  Psychiatric:        Mood and Affect: Mood normal.        Behavior: Behavior normal.     ED Results / Procedures / Treatments   Labs (all labs ordered are listed, but only abnormal results are displayed) Labs Reviewed  BASIC METABOLIC PANEL - Abnormal; Notable for the following components:      Result Value   Potassium 2.8 (*)    Glucose, Bld 325 (*)    All other components within normal limits  CBC - Abnormal; Notable for the following components:   WBC 12.0 (*)    RBC 5.88 (*)    Hemoglobin 15.6 (*)    HCT 48.1 (*)    RDW 15.9 (*)    All other components within normal limits  HEPATIC FUNCTION PANEL - Abnormal; Notable for the following components:   Total Protein 8.2 (*)    Alkaline Phosphatase 165 (*)    All other components within normal limits  CBG MONITORING, ED - Abnormal; Notable for the following components:   Glucose-Capillary 233 (*)    All other components within normal limits  TROPONIN I (HIGH SENSITIVITY) - Abnormal; Notable for the following components:   Troponin I (High Sensitivity) 21 (*)     All other components within normal limits  MAGNESIUM  LIPASE, BLOOD  TROPONIN I (HIGH SENSITIVITY)  TROPONIN I (HIGH SENSITIVITY)  TROPONIN I (HIGH SENSITIVITY)    EKG None  Radiology CT Angio Chest/Abd/Pel for Dissection W and/or Wo Contrast  Result Date: 08/27/2022 CLINICAL DATA:  Acute aortic syndrome suspected. History of thyroid nodule with fine needle aspiration 08/21/2022. EXAM: CT ANGIOGRAPHY CHEST, ABDOMEN AND PELVIS TECHNIQUE: Non-contrast CT of the chest was initially obtained. Multidetector CT imaging through the chest, abdomen and pelvis was performed using the standard protocol during bolus administration of intravenous contrast. Multiplanar reconstructed images and MIPs were obtained and reviewed to evaluate the vascular anatomy. RADIATION DOSE REDUCTION: This exam was performed according to the departmental dose-optimization program which includes automated exposure control, adjustment of the mA and/or kV according to patient size and/or use of iterative reconstruction technique. CONTRAST:  OMNIPAQUE IOHEXOL 350 MG/ML SOLN COMPARISON:  Chest, abdomen and pelvis CTA 07/07/2022. Abdominopelvic CT 12/16/2020. FINDINGS: CTA CHEST FINDINGS Cardiovascular: No acute vascular findings in the chest. Specifically, no evidence of aortic aneurysm, dissection or penetrating ulcer. There is a left circumflex coronary artery stent. No evidence of acute pulmonary embolism. The heart size is normal. There is no pericardial effusion. Mediastinum/Nodes: There are no enlarged mediastinal, hilar or axillary lymph nodes. Small mediastinal lymph nodes are similar to the previous study, likely reactive there is a small hiatal hernia with distal esophageal wall thickening. Heterogeneity of the thyroid gland and left-sided nodule are unchanged from previous CT (biopsy performed in the interval). Lungs/Pleura: No pleural effusion or pneumothorax. Improved aeration of both lung bases. No airspace disease  or suspicious pulmonary nodule. Musculoskeletal/Chest wall: No chest wall mass or suspicious osseous findings. Stable T6 bone island. Review of the MIP images confirms the above findings. CTA ABDOMEN AND PELVIS FINDINGS VASCULAR Aorta: Mild atherosclerosis.  No evidence  of aneurysm or dissection. Celiac: Patent without evidence of aneurysm, dissection or significant stenosis. SMA: Patent without evidence of aneurysm, dissection or significant stenosis. Replaced right hepatic artery. Renals: Both renal arteries are patent without evidence of aneurysm, dissection or significant stenosis. IMA: Patent without evidence of aneurysm, dissection, vasculitis or significant stenosis. Inflow: Atherosclerosis without evidence of dissection, aneurysm or significant stenosis. Veins: No obvious venous abnormality within the limitations of this arterial phase study. Review of the MIP images confirms the above findings. NON-VASCULAR FINDINGS Hepatobiliary: The liver is normal in density without suspicious focal abnormality. No evidence of gallstones, gallbladder wall thickening or biliary dilatation. Pancreas: Unremarkable. No pancreatic ductal dilatation or surrounding inflammatory changes. Spleen: Progressive enlargement of a low-density lesion centrally in the spleen which now measures 2.6 cm on image 123/8 (previously 2.2 cm). Adrenals/Urinary Tract: Both adrenal glands appear normal. Nonobstructing calculus in the upper pole of the left kidney with adjacent cortical scarring. No evidence of ureteral calculus or hydronephrosis. There are bilateral retroperitoneal phleboliths. The bladder appears normal for its degree of distention. Stomach/Bowel: There is some high density material within the stomach, presumably ingested. The stomach appears unremarkable for its degree of distension. No evidence of bowel wall thickening, distention or surrounding inflammatory change. The appendix appears normal. Lymphatic: There are no enlarged  abdominal or pelvic lymph nodes. Reproductive: Hysterectomy.  No adnexal mass. Other: Stable diastasis of the rectus abdominus muscles. New ill-defined increased density in the subcutaneous fat of the right anterior abdominal wall, likely related to subcutaneous injections. No ascites or free air. Musculoskeletal: No acute or significant osseous findings. Review of the MIP images confirms the above findings. IMPRESSION: 1. No acute vascular findings in the chest, abdomen or pelvis. No evidence of acute aortic syndrome. 2. Indeterminate splenic lesion appears enlarged from recent CT and prior examination from 2021. Consider further characterization with outpatient abdominal MRI (best performed when the patient is able to suspend respiration). 3. Similar distal esophageal wall thickening which may relate to chronic reflux or esophagitis. 4. Coronary artery stent.  Aortic Atherosclerosis (ICD10-I70.0). Electronically Signed   By: Carey Bullocks M.D.   On: 08/27/2022 13:45   DG Chest 2 View  Result Date: 08/27/2022 CLINICAL DATA:  Chest pain EXAM: CHEST - 2 VIEW COMPARISON:  07/07/2022 FINDINGS: Cardiomediastinal silhouette and pulmonary vasculature are within normal limits. Lungs are clear. IMPRESSION: No acute cardiopulmonary process. Electronically Signed   By: Acquanetta Belling M.D.   On: 08/27/2022 09:10    Procedures Procedures    Medications Ordered in ED Medications  metoprolol tartrate (LOPRESSOR) tablet 25 mg (25 mg Oral Given 08/27/22 0852)  amLODipine (NORVASC) tablet 10 mg (10 mg Oral Given 08/27/22 0852)  potassium chloride 10 mEq in 100 mL IVPB (0 mEq Intravenous Stopped 08/27/22 1452)  nitroGLYCERIN (NITROGLYN) 2 % ointment 1 inch (1 inch Topical Given 08/27/22 0852)  potassium chloride SA (KLOR-CON M) CR tablet 40 mEq (40 mEq Oral Given 08/27/22 1036)  lactated ringers bolus 1,000 mL (0 mLs Intravenous Stopped 08/27/22 1214)  magnesium sulfate IVPB 1 g 100 mL (0 g Intravenous Stopped 08/27/22  1215)  alum & mag hydroxide-simeth (MAALOX/MYLANTA) 200-200-20 MG/5ML suspension 30 mL (30 mLs Oral Given 08/27/22 1312)    And  lidocaine (XYLOCAINE) 2 % viscous mouth solution 15 mL (15 mLs Oral Given 08/27/22 1312)  famotidine (PEPCID) IVPB 20 mg premix (0 mg Intravenous Stopped 08/27/22 1349)  fentaNYL (SUBLIMAZE) injection 50 mcg (50 mcg Intravenous Given 08/27/22 1313)  iohexol (OMNIPAQUE) 350 MG/ML  injection 100 mL (100 mLs Intravenous Contrast Given 08/27/22 1320)    ED Course/ Medical Decision Making/ A&P                           Medical Decision Making Amount and/or Complexity of Data Reviewed Labs: ordered. Radiology: ordered.  Risk OTC drugs. Prescription drug management.   This patient presents to the ED for concern of chest pain, this involves an extensive number of treatment options, and is a complaint that carries with it a high risk of complications and morbidity.  The differential diagnosis includes ACS, GERD, PE, aortic dissection, costochondritis, pericarditis, pneumonia   Co morbidities that complicate the patient evaluation  HTN, DM, anxiety, arthritis, GERD, depression, hiatal hernia, PUD, OSA, CAD   Additional history obtained:  Additional history obtained from N/A External records from outside source obtained and reviewed including EMR   Lab Tests:  I Ordered, and personally interpreted labs.  The pertinent results include: Hyperglycemia without evidence of DKA.  Hypokalemia and low-normal magnesium are present.  Hemoglobin and WBC are increased suggestive of hemoconcentration.  Lipase is normal. Initial troponins are 15 --> 16.   Imaging Studies ordered:  I ordered imaging studies including chest x-ray, CTA dissection study I independently visualized and interpreted imaging which showed no acute findings. Incidental finding of splenic lesions of indeterminate etiology, increased in size from prior imaging. I agree with the radiologist  interpretation   Cardiac Monitoring: / EKG:  The patient was maintained on a cardiac monitor.  I personally viewed and interpreted the cardiac monitored which showed an underlying rhythm of: sinus rhythm   Consultations Obtained:  I requested consultation with the cardiologist,  and discussed lab and imaging findings as well as pertinent plan - they recommend: (recommendations pending ED evaluation).   Problem List / ED Course / Critical interventions / Medication management  Patient presents for acute chest pain starting last night.  Associated symptoms include nausea, vomiting, diaphoresis.  On arrival in the ED, patient is well-appearing.  She does endorse continued chest pain and nausea.  Vital signs are notable for hypertension and slight tachycardia.  Patient's home medications of metoprolol and amlodipine were ordered in addition to NTG ointment for management of hypertension and chest pain.  Laboratory work-up was initiated.  EKG did not show evidence of ST segment changes.  Initial lab work is notable for slight leukocytosis and increased hemoglobin from baseline, consistent with hemoconcentration.  Bolus of IV fluids was ordered.  BMP showed hyperglycemia without evidence of DKA.  Electrolytes were notable for hypokalemia and low-normal magnesium.  Replacement potassium and magnesium were ordered.  Patient's initial troponin was normal.  On reassessment, patient reports near complete resolution of symptoms.  Given her symptoms of chest pain radiating to her back, and concern of possible aortic dissection in the setting of hypertension.  Given her tachycardia and chest pain, I am also concerned of PE.  CTA dissection study was ordered to evaluate for these possible etiologies.while in the ED, patient had resolution of her symptoms.  Her hypertension also resolved.  CTA showed no acute findings.  There were incidental findings of splenic lesions that are of indeterminate etiology and have been  present on prior imaging studies, although size has increased.  It is unlikely that these lesions are related to her symptoms today.   Initial troponin was in the normal range at 15.  Delta troponin was 16.  At around this time, patient  had recurrence of symptoms.  She describes it as a sudden onset, severe sensation that her heart is being squeezed in a vice. EKG, taken at time of symptom onset, showed no dynamic changes.  Third troponin was ordered. She was given fentanyl.  GI cocktail was ordered for empiric treatment of possible GERD etiology.  Symptoms resolved.  I consulted cardiology who will see the patient in the ED. patient had a subsequent further episode of similar symptoms.  This resolved without intervention.  Third troponin showed a slight increase to 21.  Cardiology evaluated the patient in the ED.  Cardiologist, Dr. Servando Salina, recommends overnight observation with admission to medicine.  Care of patient was signed out to oncoming ED provider. I ordered medication including NTG and fentanyl for chest pain; IVF for hydration; potassium chloride for hypokalemia; magnesium sulfate for low-normal magnesium; amlodipine and metoprolol for hypertension; GI cocktail for empiric treatment of GERD Reevaluation of the patient after these medicines showed that the patient improved I have reviewed the patients home medicines and have made adjustments as needed   Social Determinants of Health:  Has access to outpatient care         Final Clinical Impression(s) / ED Diagnoses Final diagnoses:  Chest pain, unspecified type    Rx / DC Orders ED Discharge Orders     None         Gloris Manchester, MD 08/27/22 1840

## 2022-08-27 NOTE — ED Notes (Signed)
Pt transported to CT ?

## 2022-08-27 NOTE — ED Notes (Signed)
Pt went to imaging for CXR

## 2022-08-27 NOTE — H&P (Signed)
History and Physical    Patient: Stephanie Thornton DEY:[id number] DOB: 05-Oct-1971 DOA: 08/27/2022 DOS: the patient was seen and examined on 08/27/2022 PCP: Paschal Dopp, PA  Patient coming from: Home  Chief Complaint:  Chief Complaint  Patient presents with   Chest Pain    Started last night at 2200 when laying in bed, awoke from sleep radiating to back and left shoulder dull and aching. ASA 324 and 3 sublingual nitros taken, Little relief.   HPI: Stephanie Thornton is a 51 y.o. female with medical history significant of  CAD with recent NSTEMI s/p to LCx 05/07/2022, hypertension, hyperlipidemia, type 2 diabetes mellitus, OSA not on CPAP, anxiety/depression/ bipolar disorder who presents to the emergency department due to chest pain which started last night around 10 PM while at rest.  Chest pain was on left side of her chest, it was dull and achy and was rated as 8/10 on pain scale with radiation to left arm and back.  Patient initially thought it was due to indigestion, so she took Pepto-Bismol without any relief.  Pain persisted through the night and around 5 AM, she took 3 sublingual nitroglycerin with alleviation of the chest pain.  Patient also took aspirin 325 mg x 1.  She activated EMS and she was taken to the ED for further evaluation and management.  ED Course:  In the emergency department, She was tachycardic on arrival with HR of 102 bpm, BP was 187/110, but other vital signs were within normal range.  Work-up in ED showed leukocytosis and elevated H/H (15.6/48.1), BMP was normal except for hypokalemia (K+ 2.8) and hyperglycemia.  Troponin - 15 > 16 > 21 > 17, magnesium 1.8, lipase 24 CT angiography chest, abdomen and pelvis showed: 1. No acute vascular findings in the chest, abdomen or pelvis. No evidence of acute aortic syndrome. 2. Indeterminate splenic lesion appears enlarged from recent CT and prior examination from 2021. Consider further characterization with outpatient  abdominal MRI (best performed when the patient is able to suspend respiration). 3. Similar distal esophageal wall thickening which may relate to chronic reflux or esophagitis. 4. Coronary artery stent.  Aortic Atherosclerosis  Chest x-ray showed no acute cardiopulmonary process She was treated with GI cocktail, fentanyl, famotidine, magnesium, Lopressor.  Nitroglycerin ointment was provided and potassium was replenished. Cardiology was consulted and recommended admitting patient with plan to continue to follow-up with patient.  Hospitalist was asked to admit patient for further evaluation and management.  Review of Systems: Review of systems as noted in the HPI. All other systems reviewed and are negative.   Past Medical History:  Diagnosis Date   Anxiety    Arthritis    "knees, feet, back" (05/24/2015)   Benign essential HTN 05/01/2015   Bipolar disorder (HCC) diagnosed early 90s   CAD in native artery    Chronic bronchitis (HCC)    "get it q yr"   Chronic lower back pain    Depression    Diabetes mellitus type II 2010   GERD (gastroesophageal reflux disease)    Headache    "maybe 3 times/wk" (05/24/2015)   History of hiatal hernia    History of stomach ulcers    HLD (hyperlipidemia)    Menopause    Migraine    "1-2/wk" (05/24/2015)   NSTEMI (non-ST elevated myocardial infarction) (HCC) 04/2022   OSA on CPAP    Past Surgical History:  Procedure Laterality Date   ABDOMINAL HYSTERECTOMY  2007   BUNIONECTOMY Bilateral  CARPAL TUNNEL RELEASE Right    CORONARY STENT INTERVENTION N/A 05/07/2022   Procedure: CORONARY STENT INTERVENTION;  Surgeon: Lyn Records, MD;  Location: [facility] INVASIVE CV LAB;  Service: Cardiovascular;  Laterality: N/A;   HAMMER TOE SURGERY Left    LEFT HEART CATH AND CORONARY ANGIOGRAPHY N/A 05/20/2018   Procedure: LEFT HEART CATH AND CORONARY ANGIOGRAPHY;  Surgeon: Marykay Lex, MD;  Location: [facility] INVASIVE CV LAB;  Service: Cardiovascular;  Laterality: N/A;    LEFT HEART CATH AND CORONARY ANGIOGRAPHY N/A 05/07/2022   Procedure: LEFT HEART CATH AND CORONARY ANGIOGRAPHY;  Surgeon: Lyn Records, MD;  Location: [facility] INVASIVE CV LAB;  Service: Cardiovascular;  Laterality: N/A;   TONSILLECTOMY Bilateral 05/24/2015   Procedure: TONSILLECTOMY;  Surgeon: Christia Reading, MD;  Location: [facility];  Service: ENT;  Laterality: Bilateral;    Social History:  reports that she has been smoking cigarettes. She has a 30.00 pack-year smoking history. She has never used smokeless tobacco. She reports that she does not drink alcohol and does not use drugs.   Allergies  Allergen Reactions   Lyrica [Pregabalin] Shortness Of Breath, Itching and Rash   Rocephin [Ceftriaxone Sodium In Dextrose] Shortness Of Breath   Sulfa Antibiotics Hives, Shortness Of Breath and Itching   Ultram [Tramadol] Hives and Shortness Of Breath    Had asthma attack   Vancomycin Anaphylaxis    Per Dr. Flonnie Overman   Glucotrol [Glipizide] Hives   Keflex [Cephalexin] Hives   Penicillins Hives    Has patient had a PCN reaction causing immediate rash, facial/tongue/throat swelling, SOB or lightheadedness with hypotension: YES Has patient had a PCN reaction causing severe rash involving mucus membranes or skin necrosis: NO Has patient had a PCN reaction that required hospitalization: YES Has patient had a PCN reaction occurring within the last 10 years: YES If all of the above answers are "NO", then may proceed with Cephalosporin use.   Janumet [Sitagliptin-Metformin Hcl] Nausea Only    Family History  Problem Relation Age of Onset   Bipolar disorder Mother    CVA Mother    Bipolar disorder Son    Depression Brother        schizoaffective d/o   Hypertension Sister    Heart disease Maternal Aunt    Heart attack Maternal Aunt    Hypertension Brother      Prior to Admission medications   Medication Sig Start Date End Date Taking? Authorizing Provider  albuterol (PROVENTIL HFA;VENTOLIN HFA) 108 (90  Base) MCG/ACT inhaler Inhale 1-2 puffs into the lungs every 6 (six) hours as needed for wheezing or shortness of breath. Patient taking differently: Inhale 2 puffs into the lungs as needed for shortness of breath. 03/04/19  Yes Grayce Sessions, NP  amitriptyline (ELAVIL) 150 MG tablet Take 150 mg by mouth at bedtime. 08/06/22  Yes [provider]  amLODipine (NORVASC) 10 MG tablet Take 1 tablet (10 mg total) by mouth daily. 05/09/22  Yes Cyndi Bender, NP  aspirin 81 MG chewable tablet Chew 1 tablet (81 mg total) by mouth daily. 05/09/22  Yes Cyndi Bender, NP  baclofen (LIORESAL) 10 MG tablet Take 10 mg by mouth 3 (three) times daily. 05/05/22  Yes [provider]  busPIRone (BUSPAR) 10 MG tablet Take 10 mg by mouth 3 (three) times daily. 01/25/19  Yes [provider]  Cholecalciferol (VITAMIN D3) 1.25 MG (50000 UT) CAPS Take 1 capsule by mouth once a week. 08/08/22  Yes [provider]  furosemide (LASIX)  20 MG tablet Take 2 tablets by mouth daily X's 3 days then reduce to 1 tablet by mouth daily. Patient taking differently: Take 20 mg by mouth daily. 05/29/22  Yes Weaver, Scott T, PA-C  gabapentin (NEURONTIN) 300 MG capsule Take 1 capsule (300 mg total) by mouth 3 (three) times daily. 06/29/19  Yes Edwards, Michelle P, NP  LATUDA 80 MG TABS tablet Take 80 mg by mouth every evening. 02/01/19  Yes [provider]  metoprolol tartrate (LOPRESSOR) 25 MG tablet Take 1 tablet (25 mg total) by mouth 2 (two) times daily. 05/09/22  Yes Cyndi Bender, NP  nitroGLYCERIN (NITROSTAT) 0.4 MG SL tablet Place 1 tablet (0.4 mg total) under the tongue every 5 (five) minutes x 3 doses as needed for chest pain. 05/09/22  Yes Cyndi Bender, NP  NOVOLOG FLEXPEN 100 UNIT/ML FlexPen Inject 10-15 Units into the skin 3 (three) times daily as needed for high blood sugar. Sliding Scale 08/12/22  Yes [provider]  Oxycodone HCl 10 MG TABS Take 10 mg by mouth 4 (four) times daily as needed  (pain). 07/08/22  Yes [provider]  pantoprazole (PROTONIX) 40 MG tablet Take 1 tablet (40 mg total) by mouth daily. 07/10/22 07/10/23 Yes Zigmund Daniel., MD  TRESIBA FLEXTOUCH 100 UNIT/ML FlexTouch Pen Inject 12 Units into the skin at bedtime. 07/12/22  Yes [provider]  Acetaminophen (TYLENOL PO) Take 2 tablets by mouth 3 (three) times daily as needed (tooth ache).    [provider]  potassium chloride SA (KLOR-CON M) 20 MEQ tablet Take one tablet twice a day for five days, then take  one tablet every day. 07/17/22   Dione Booze, MD  rosuvastatin (CRESTOR) 10 MG tablet Take 10 mg by mouth at bedtime. 08/10/22   [provider]  ticagrelor (BRILINTA) 90 MG TABS tablet Take 1 tablet (90 mg total) by mouth 2 (two) times daily. Patient not taking: Reported on 07/08/2022 05/09/22   Cyndi Bender, NP  topiramate (TOPAMAX) 200 MG tablet Take 200 mg by mouth at bedtime. 01/28/19   [provider]  traZODone (DESYREL) 100 MG tablet Take 200 mg by mouth at bedtime. 02/24/19   [provider]    Physical Exam: BP 129/88   Pulse 84   Temp 98.4 F (36.9 C) (Oral)   Resp (!) 21   Ht 5\' 1"  (1.549 m)   Wt 66.7 kg   SpO2 100%   BMI 27.78 kg/m   General: 51 y.o. year-old female well developed well nourished in no acute distress.  Alert and oriented x3. HEENT: NCAT, EOMI Neck: Supple, trachea medial Cardiovascular: Tachycardia, regular rate and rhythm with no rubs or gallops.  No thyromegaly or JVD noted.  No lower extremity edema. 2/4 pulses in all 4 extremities. Respiratory: Clear to auscultation with no wheezes or rales. Good inspiratory effort. Abdomen: Soft, nontender nondistended with normal bowel sounds x4 quadrants. Muskuloskeletal: No cyanosis, clubbing or edema noted bilaterally Neuro: CN II-XII intact, strength 5/5 x 4, sensation, reflexes intact Skin: No ulcerative lesions noted or rashes Psychiatry: Judgement and insight appear  normal. Mood is appropriate for condition and setting          Labs on Admission:  Basic Metabolic Panel: Recent Labs  Lab 08/27/22 0834  NA 136  K 2.8*  CL 103  CO2 22  GLUCOSE 325*  BUN 7  CREATININE 0.89  CALCIUM 9.7  MG 1.8   Liver Function Tests: Recent Labs  Lab  08/27/22 0834  AST 15  ALT 22  ALKPHOS 165*  BILITOT 0.4  PROT 8.2*  ALBUMIN 4.2   Recent Labs  Lab 08/27/22 1010  LIPASE 24   No results for input(s): "AMMONIA" in the last 168 hours. CBC: Recent Labs  Lab 08/27/22 0834  WBC 12.0*  HGB 15.6*  HCT 48.1*  MCV 81.8  PLT 297   Cardiac Enzymes: No results for input(s): "CKTOTAL", "CKMB", "CKMBINDEX", "TROPONINI" in the last 168 hours.  BNP (last 3 results) No results for input(s): "BNP" in the last 8760 hours.  ProBNP (last 3 results) Recent Labs    05/22/22 1006 06/05/22 0831  PROBNP 951* 726*    CBG: Recent Labs  Lab 08/27/22 1214  GLUCAP 233*    Radiological Exams on Admission: CT Angio Chest/Abd/Pel for Dissection W and/or Wo Contrast  Result Date: 08/27/2022 CLINICAL DATA:  Acute aortic syndrome suspected. History of thyroid nodule with fine needle aspiration 08/21/2022. EXAM: CT ANGIOGRAPHY CHEST, ABDOMEN AND PELVIS TECHNIQUE: Non-contrast CT of the chest was initially obtained. Multidetector CT imaging through the chest, abdomen and pelvis was performed using the standard protocol during bolus administration of intravenous contrast. Multiplanar reconstructed images and MIPs were obtained and reviewed to evaluate the vascular anatomy. RADIATION DOSE REDUCTION: This exam was performed according to the departmental dose-optimization program which includes automated exposure control, adjustment of the mA and/or kV according to patient size and/or use of iterative reconstruction technique. CONTRAST:  100mL OMNIPAQUE IOHEXOL 350 MG/ML SOLN COMPARISON:  Chest, abdomen and pelvis CTA 07/07/2022. Abdominopelvic CT 12/16/2020. FINDINGS: CTA  CHEST FINDINGS Cardiovascular: No acute vascular findings in the chest. Specifically, no evidence of aortic aneurysm, dissection or penetrating ulcer. There is a left circumflex coronary artery stent. No evidence of acute pulmonary embolism. The heart size is normal. There is no pericardial effusion. Mediastinum/Nodes: There are no enlarged mediastinal, hilar or axillary lymph nodes. Small mediastinal lymph nodes are similar to the previous study, likely reactive there is a small hiatal hernia with distal esophageal wall thickening. Heterogeneity of the thyroid gland and left-sided nodule are unchanged from previous CT (biopsy performed in the interval). Lungs/Pleura: No pleural effusion or pneumothorax. Improved aeration of both lung bases. No airspace disease or suspicious pulmonary nodule. Musculoskeletal/Chest wall: No chest wall mass or suspicious osseous findings. Stable T6 bone island. Review of the MIP images confirms the above findings. CTA ABDOMEN AND PELVIS FINDINGS VASCULAR Aorta: Mild atherosclerosis.  No evidence of aneurysm or dissection. Celiac: Patent without evidence of aneurysm, dissection or significant stenosis. SMA: Patent without evidence of aneurysm, dissection or significant stenosis. Replaced right hepatic artery. Renals: Both renal arteries are patent without evidence of aneurysm, dissection or significant stenosis. IMA: Patent without evidence of aneurysm, dissection, vasculitis or significant stenosis. Inflow: Atherosclerosis without evidence of dissection, aneurysm or significant stenosis. Veins: No obvious venous abnormality within the limitations of this arterial phase study. Review of the MIP images confirms the above findings. NON-VASCULAR FINDINGS Hepatobiliary: The liver is normal in density without suspicious focal abnormality. No evidence of gallstones, gallbladder wall thickening or biliary dilatation. Pancreas: Unremarkable. No pancreatic ductal dilatation or surrounding  inflammatory changes. Spleen: Progressive enlargement of a low-density lesion centrally in the spleen which now measures 2.6 cm on image 123/8 (previously 2.2 cm). Adrenals/Urinary Tract: Both adrenal glands appear normal. Nonobstructing calculus in the upper pole of the left kidney with adjacent cortical scarring. No evidence of ureteral calculus or hydronephrosis. There are bilateral retroperitoneal phleboliths. The bladder appears normal for  its degree of distention. Stomach/Bowel: There is some high density material within the stomach, presumably ingested. The stomach appears unremarkable for its degree of distension. No evidence of bowel wall thickening, distention or surrounding inflammatory change. The appendix appears normal. Lymphatic: There are no enlarged abdominal or pelvic lymph nodes. Reproductive: Hysterectomy.  No adnexal mass. Other: Stable diastasis of the rectus abdominus muscles. New ill-defined increased density in the subcutaneous fat of the right anterior abdominal wall, likely related to subcutaneous injections. No ascites or free air. Musculoskeletal: No acute or significant osseous findings. Review of the MIP images confirms the above findings. IMPRESSION: 1. No acute vascular findings in the chest, abdomen or pelvis. No evidence of acute aortic syndrome. 2. Indeterminate splenic lesion appears enlarged from recent CT and prior examination from 2021. Consider further characterization with outpatient abdominal MRI (best performed when the patient is able to suspend respiration). 3. Similar distal esophageal wall thickening which may relate to chronic reflux or esophagitis. 4. Coronary artery stent.  Aortic Atherosclerosis (ICD10-I70.0). Electronically Signed   By: Carey Bullocks M.D.   On: 08/27/2022 13:45   DG Chest 2 View  Result Date: 08/27/2022 CLINICAL DATA:  Chest pain EXAM: CHEST - 2 VIEW COMPARISON:  07/07/2022 FINDINGS: Cardiomediastinal silhouette and pulmonary vasculature are  within normal limits. Lungs are clear. IMPRESSION: No acute cardiopulmonary process. Electronically Signed   By: Acquanetta Belling M.D.   On: 08/27/2022 09:10    EKG: I independently viewed the EKG done and my findings are as followed: Sinus tachycardia at rate of 100 bpm with increased PR interval  Assessment/Plan Present on Admission:  Chest pain  Bipolar disorder (HCC)  Coronary artery disease  Depression  Leukocytosis  Hypokalemia  OSA (obstructive sleep apnea)  Principal Problem:   Chest pain Active Problems:   Hypokalemia   Coronary artery disease   Reflux esophagitis   Depression   Essential hypertension   Mixed hyperlipidemia   Leukocytosis   OSA (obstructive sleep apnea)   Bipolar disorder (HCC)   Uncontrolled type 2 diabetes mellitus with hyperglycemia (HCC)   Splenic lesion  Chest pain rule out ACS Troponin - 15 > 16 > 21 > 17, troponin already flattened Chest pain has since improved Cardiovascular risk factors include CAD s/p stent placement, hypertension, hyperlipidemia, type 2 diabetes mellitus Continue telemetry  EKG personally reviewed showed sinus tachycardia at rate of 100 bpm with increased PR interval CT angiogram of your chest showed no aortic dissection Cardiology was consulted by ED physician and is following.  We shall await further recommendation  Continue aspirin, Brilinta, nitroglycerin as needed  Hypokalemia K+ is 2.8 K+ will be replenished Please monitor for AM K+ for further replenishmemnt  Leukocytosis possibly reactive vs hemoconcentration WBC 12.0, no obvious acute infectious process Continue to monitor WBC with morning labs  CAD s/p stent placement Patient had stent placed to LCx in May 2023.  She had CAD in native vessel with 100% OM Continue aspirin and Brilinta  Splenic lesion Indeterminate splenic nodule noted on CT angiography of chest Outpatient abdominal MRI (best performed when the patient is able to suspend respiration)  recommended  Essential hypertension Continue Lasix, amlodipine, metoprolol  Mixed hyperlipidemia Continue rosuvastatin  GERD Chronic reflux esophagitis Esophageal wall thickening noted on CT angiography of chest Continue Protonix  Type 2 diabetes mellitus with uncontrolled hyperglycemia Continue Semglee 10 units nightly and adjust dose accordingly Continue ISS and hypoglycemic protocol  OSA (obstructive sleep apnea) Patient has not been using CPAP.  Use supplemental oxygen as needed  Depression Anxiety/bipolar disorder Continue on trazodone and buspirone  DVT prophylaxis: Lovenox  Code Status: Full code  Consults: Cardiology (by ED team)  Family Communication: None at bedside  Severity of Illness: The appropriate patient status for this patient is INPATIENT. Inpatient status is judged to be reasonable and necessary in order to provide the required intensity of service to ensure the patient's safety. The patient's presenting symptoms, physical exam findings, and initial radiographic and laboratory data in the context of their chronic comorbidities is felt to place them at high risk for further clinical deterioration. Furthermore, it is not anticipated that the patient will be medically stable for discharge from the hospital within 2 midnights of admission.   * I certify that at the point of admission it is my clinical judgment that the patient will require inpatient hospital care spanning beyond 2 midnights from the point of admission due to high intensity of service, high risk for further deterioration and high frequency of surveillance required.*  Author: Frankey Shown, DO 08/27/2022 9:06 PM  For on call review [email or web address].

## 2022-08-27 NOTE — ED Notes (Signed)
PT on bedside commode.

## 2022-08-27 NOTE — Consult Note (Addendum)
Cardiology Consultation   Patient ID: Marisella Puccio MRN: [id number]; DOB: 06-11-1971  Admit date: 08/27/2022 Date of Consult: 08/27/2022  PCP:  [facility], [place] [facility] Cardiologist:  Fransico Him, MD        Patient Profile:   Kalana Yust is a 51 y.o. female with a hx of CAD, NSTEMI, 04/2022 PCI, HTN, DM-2, HLD, OSA on CPAP though not compliant, asthma, hx of CVA + tobacco, Bi polar disorder use who is being seen 08/27/2022 for the evaluation of chest pain at the request of Dr. Doren Custard.  History of Present Illness:   Ms. Tregre with hx as above and MI in 04/2022 with cath and 100% OM -treated medically and 99% LCX treated with PCI DES.  Echo at that time with EF 50-55%, mild concentric LCH, normal RVSF, mildly elevated PASP, RVSP 39.2, trivial MR, anterolateral HK.   Hx of low BP on thiazides.  She did develop SOB and was placed on po lasix.    In July this year admitted with DKA  AKI, and acute pancreatitis.  Treated and discharged   Now presents today by EMS for chest pain that began last pm.  She took 324 mg of ASA, then 3 SL NTG without relief.  Her pain is left sided, she though indigestion but pepto without relief.  At 2 A she had nausea and vomiting.  This persisted and she took the ASA and NTG.  Minimal releif with NTG.    EKG:  The EKG was personally reviewed and demonstrates:  SR without acute changes.   Telemetry:  Telemetry was personally reviewed and demonstrates:  SR   K+ 2.8  BUN 7, Cr 0.89 Mg+ 1.8 AST 15, ALT 22 ALK phos 165 Hs troponin 15,16,21  Hgb 15.6 WBC 12 plts 297  Lipase 148   2V CXR  NAD     CTA of chest  IMPRESSION: 1. No acute vascular findings in the chest, abdomen or pelvis. No evidence of acute aortic syndrome. 2. Indeterminate splenic lesion appears enlarged from recent CT and prior examination from 2021. Consider further characterization with outpatient abdominal MRI (best performed when the patient is able  to suspend respiration). 3. Similar distal esophageal wall thickening which may relate to chronic reflux or esophagitis. 4. Coronary artery stent.  Aortic Atherosclerosis (ICD10-I70.0   Past Medical History:  Diagnosis Date   Anxiety    Arthritis    "knees, feet, back" (05/24/2015)   Benign essential HTN 05/01/2015   Bipolar disorder ([place]) diagnosed early 90s   Chronic bronchitis ([place])    "get it q yr"   Chronic lower back pain    Depression    Diabetes mellitus type II 2010   GERD (gastroesophageal reflux disease)    Headache    "maybe 3 times/wk" (05/24/2015)   History of hiatal hernia    History of stomach ulcers    Menopause    Migraine    "1-2/wk" (05/24/2015)   OSA on CPAP     Past Surgical History:  Procedure Laterality Date   ABDOMINAL HYSTERECTOMY  2007   BUNIONECTOMY Bilateral    CARPAL TUNNEL RELEASE Right    CORONARY STENT INTERVENTION N/A 05/07/2022   Procedure: CORONARY STENT INTERVENTION;  Surgeon: Belva Crome, MD;  Location: [facility];  Service: Cardiovascular;  Laterality: N/A;   HAMMER TOE SURGERY Left    LEFT HEART CATH AND CORONARY ANGIOGRAPHY N/A 05/20/2018   Procedure: LEFT HEART  CATH AND CORONARY ANGIOGRAPHY;  Surgeon: Leonie Man, MD;  Location: [facility];  Service: Cardiovascular;  Laterality: N/A;   LEFT HEART CATH AND CORONARY ANGIOGRAPHY N/A 05/07/2022   Procedure: LEFT HEART CATH AND CORONARY ANGIOGRAPHY;  Surgeon: Belva Crome, MD;  Location: [facility];  Service: Cardiovascular;  Laterality: N/A;   TONSILLECTOMY Bilateral 05/24/2015   Procedure: TONSILLECTOMY;  Surgeon: Melida Quitter, MD;  Location: [facility];  Service: ENT;  Laterality: Bilateral;     Home Medications:  Prior to Admission medications   Medication Sig Start Date End Date Taking? Authorizing Provider  Acetaminophen (TYLENOL PO) Take 2 tablets by mouth 3 (three) times daily as needed (tooth ache).    [provider]  albuterol (PROVENTIL  HFA;VENTOLIN HFA) 108 (90 Base) MCG/ACT inhaler Inhale 1-2 puffs into the lungs every 6 (six) hours as needed for wheezing or shortness of breath. Patient taking differently: Inhale 2 puffs into the lungs as needed for shortness of breath. 03/04/19   Kerin Perna, NP  amLODipine (NORVASC) 10 MG tablet Take 1 tablet (10 mg total) by mouth daily. 05/09/22   Margie Billet, NP  aspirin 81 MG chewable tablet Chew 1 tablet (81 mg total) by mouth daily. 05/09/22   Margie Billet, NP  baclofen (LIORESAL) 10 MG tablet Take 10 mg by mouth 3 (three) times daily. 05/05/22   [provider]  busPIRone (BUSPAR) 10 MG tablet Take 10 mg by mouth 3 (three) times daily. 01/25/19   [provider]  furosemide (LASIX) 20 MG tablet Take 2 tablets by mouth daily X's 3 days then reduce to 1 tablet by mouth daily. Patient taking differently: Take 20 mg by mouth 2 (two) times daily. 05/29/22   Richardson Dopp T, PA-C  gabapentin (NEURONTIN) 300 MG capsule Take 1 capsule (300 mg total) by mouth 3 (three) times daily. 06/29/19   Kerin Perna, NP  LATUDA 80 MG TABS tablet Take 80 mg by mouth every evening. 02/01/19   [provider]  metoprolol tartrate (LOPRESSOR) 25 MG tablet Take 1 tablet (25 mg total) by mouth 2 (two) times daily. 05/09/22   Margie Billet, NP  naloxone [facility]) nasal spray 4 mg/0.1 mL Place 0.4 mg into the nose once. 05/24/22   [provider]  nitroGLYCERIN (NITROSTAT) 0.4 MG SL tablet Place 1 tablet (0.4 mg total) under the tongue every 5 (five) minutes x 3 doses as needed for chest pain. 05/09/22   Margie Billet, NP  NOVOLOG 100 UNIT/ML injection Inject 5-22 Units into the skin in the morning, at noon, in the evening, and at bedtime. 11/21/20   [provider]  Olmesartan-amLODIPine-HCTZ 40-10-25 MG TABS Take 1 tablet by mouth daily. 06/28/22   [provider]  Oxycodone HCl 10 MG TABS Take 10 mg by mouth 4 (four) times daily as needed. 07/08/22   [provider]  pantoprazole (PROTONIX) 40 MG tablet Take 1 tablet (40 mg total) by mouth daily. 07/10/22 07/10/23  Elodia Florence., MD  potassium chloride SA (KLOR-CON M) 20 MEQ tablet Take one tablet twice a day for five days, then take  one tablet every day. 0/96/28   Delora Fuel, MD  rosuvastatin (CRESTOR) 20 MG tablet Take 20 mg by mouth at bedtime. 06/27/22   [provider]  ticagrelor (BRILINTA) 90 MG TABS tablet Take 1 tablet (90 mg total) by mouth 2 (two) times daily. Patient not taking: Reported on 07/08/2022 05/09/22   Maylon Peppers,  Tonny Bollman, NP  topiramate (TOPAMAX) 200 MG tablet Take 200 mg by mouth at bedtime. 01/28/19   [provider]  traZODone (DESYREL) 100 MG tablet Take 200 mg by mouth at bedtime. 02/24/19   [provider]    Inpatient Medications: Scheduled Meds:  amLODipine  10 mg Oral Daily   metoprolol tartrate  25 mg Oral BID   Continuous Infusions:  PRN Meds:   Allergies:    Allergies  Allergen Reactions   Lyrica [Pregabalin] Shortness Of Breath, Itching and Rash   Rocephin [Ceftriaxone Sodium In Dextrose] Shortness Of Breath   Sulfa Antibiotics Hives, Shortness Of Breath and Itching   Ultram [Tramadol] Hives and Shortness Of Breath    Had asthma attack   Vancomycin Anaphylaxis    Per Dr. Henderson Cloud   Glucotrol [Glipizide] Hives   Keflex [Cephalexin] Hives   Penicillins Hives    Has patient had a PCN reaction causing immediate rash, facial/tongue/throat swelling, SOB or lightheadedness with hypotension: YES Has patient had a PCN reaction causing severe rash involving mucus membranes or skin necrosis: NO Has patient had a PCN reaction that required hospitalization: YES Has patient had a PCN reaction occurring within the last 10 years: YES If all of the above answers are "NO", then may proceed with Cephalosporin use.   Janumet [Sitagliptin-Metformin Hcl] Nausea Only    Social History:   Social History   Socioeconomic History   Marital status:  Divorced    Spouse name: Not on file   Number of children: 3   Years of education: Not on file   Highest education level: Not on file  Occupational History   Not on file  Tobacco Use   Smoking status: Some Days    Packs/day: 1.00    Years: 30.00    Total pack years: 30.00    Types: Cigarettes   Smokeless tobacco: Never  Vaping Use   Vaping Use: Never used  Substance and Sexual Activity   Alcohol use: No   Drug use: No   Sexual activity: Yes  Other Topics Concern   Not on file  Social History Narrative   Not on file   Social Determinants of Health   Financial Resource Strain: Not on file  Food Insecurity: Not on file  Transportation Needs: Not on file  Physical Activity: Not on file  Stress: Not on file  Social Connections: Not on file  Intimate Partner Violence: Not on file    Family History:    Family History  Problem Relation Age of Onset   Bipolar disorder Mother    CVA Mother    Bipolar disorder Son    Depression Brother        schizoaffective d/o   Hypertension Sister    Heart disease Maternal Aunt    Heart attack Maternal Aunt    Hypertension Brother      ROS:  Please see the history of present illness.  General:no colds or fevers, no weight changes Skin:no rashes or ulcers HEENT:no blurred vision, no congestion CV:see HPI PUL:see HPI GI:no diarrhea constipation or melena, no indigestion GU:no hematuria, no dysuria MS:no joint pain, no claudication Neuro:no syncope, no lightheadedness Endo:+ diabetes has been uncontrolled recently, no thyroid disease Psych hx of bipolar disorder  All other ROS reviewed and negative.     Physical Exam/Data:   Vitals:   08/27/22 1415 08/27/22 1430 08/27/22 1500 08/27/22 1530  BP: (!) 122/96 (!) 132/93 118/89 116/72  Pulse: 82 81  76  Resp: 17 (!)  24 19 (!) 22  Temp:   98.9 F (37.2 C)   TempSrc:      SpO2: 98% 99%  98%  Weight:      Height:        Intake/Output Summary (Last 24 hours) at 08/27/2022  1631 Last data filed at 08/27/2022 1349 Gross per 24 hour  Intake 1150 ml  Output --  Net 1150 ml      08/27/2022    8:02 AM 07/07/2022    6:44 PM 05/22/2022    8:42 AM  Last 3 Weights  Weight (lbs) 147 lb 162 lb 4.1 oz 162 lb 3.2 oz  Weight (kg) 66.679 kg 73.6 kg 73.573 kg     Body mass index is 27.78 kg/m.  General:  Well nourished, well developed, in no acute distress HEENT: normal Neck: no JVD Vascular: No carotid bruits; Distal pulses 2+ bilaterally Cardiac:  normal S1, S2; RRR; no murmur gallup rub or click Lungs:  clear to auscultation bilaterally, no wheezing, rhonchi or rales  Abd: soft, nontender, no hepatomegaly  Ext: no edema Musculoskeletal:  No deformities, BUE and BLE strength normal and equal Skin: warm and dry  Neuro:  alert and oriented X 3 MAE follows commands, no focal abnormalities noted Psych:  Normal affect    Relevant CV Studies:  LEFT HEART CATH AND CORONARY ANGIOGRAPHY 05/07/2022 LCx 99; OM1 100 LAD irregularities RCA mid 30 EF 55-65 PCI: 3 x 24 Synergy DES to the LCx RECOMMENDATIONS: She has had a completed infarct.  Possibly had recanalization of the totally occluded first obtuse marginal.  With LV hypokinesis and late presentation, there is some potential risk of mechanical complication such as free wall rupture.           ECHO COMPLETE WO IMAGING ENHANCING AGENT 05/07/2022 EF 50-55, mild concentric LVH, normal RVSF, mildly elevated PASP, RVSP 39.2, trivial MR, anterolateral HK    Laboratory Data:  High Sensitivity Troponin:   Recent Labs  Lab 08/27/22 0834 08/27/22 1013  TROPONINIHS 15 16     Chemistry Recent Labs  Lab 08/27/22 0834  NA 136  K 2.8*  CL 103  CO2 22  GLUCOSE 325*  BUN 7  CREATININE 0.89  CALCIUM 9.7  MG 1.8  GFRNONAA >60  ANIONGAP 11    Recent Labs  Lab 08/27/22 0834  PROT 8.2*  ALBUMIN 4.2  AST 15  ALT 22  ALKPHOS 165*  BILITOT 0.4   Lipids No results for input(s): "CHOL", "TRIG", "HDL",  "LABVLDL", "LDLCALC", "CHOLHDL" in the last 168 hours.  Hematology Recent Labs  Lab 08/27/22 0834  WBC 12.0*  RBC 5.88*  HGB 15.6*  HCT 48.1*  MCV 81.8  MCH 26.5  MCHC 32.4  RDW 15.9*  PLT 297   Thyroid No results for input(s): "TSH", "FREET4" in the last 168 hours.  BNPNo results for input(s): "BNP", "PROBNP" in the last 168 hours.  DDimer No results for input(s): "DDIMER" in the last 168 hours.   Radiology/Studies:  CT Angio Chest/Abd/Pel for Dissection W and/or Wo Contrast  Result Date: 08/27/2022 CLINICAL DATA:  Acute aortic syndrome suspected. History of thyroid nodule with fine needle aspiration 08/21/2022. EXAM: CT ANGIOGRAPHY CHEST, ABDOMEN AND PELVIS TECHNIQUE: Non-contrast CT of the chest was initially obtained. Multidetector CT imaging through the chest, abdomen and pelvis was performed using the standard protocol during bolus administration of intravenous contrast. Multiplanar reconstructed images and MIPs were obtained and reviewed to evaluate the vascular anatomy. RADIATION DOSE REDUCTION: This exam  was performed according to the departmental dose-optimization program which includes automated exposure control, adjustment of the mA and/or kV according to patient size and/or use of iterative reconstruction technique. CONTRAST:  19mL OMNIPAQUE IOHEXOL 350 MG/ML SOLN COMPARISON:  Chest, abdomen and pelvis CTA 07/07/2022. Abdominopelvic CT 12/16/2020. FINDINGS: CTA CHEST FINDINGS Cardiovascular: No acute vascular findings in the chest. Specifically, no evidence of aortic aneurysm, dissection or penetrating ulcer. There is a left circumflex coronary artery stent. No evidence of acute pulmonary embolism. The heart size is normal. There is no pericardial effusion. Mediastinum/Nodes: There are no enlarged mediastinal, hilar or axillary lymph nodes. Small mediastinal lymph nodes are similar to the previous study, likely reactive there is a small hiatal hernia with distal esophageal wall  thickening. Heterogeneity of the thyroid gland and left-sided nodule are unchanged from previous CT (biopsy performed in the interval). Lungs/Pleura: No pleural effusion or pneumothorax. Improved aeration of both lung bases. No airspace disease or suspicious pulmonary nodule. Musculoskeletal/Chest wall: No chest wall mass or suspicious osseous findings. Stable T6 bone island. Review of the MIP images confirms the above findings. CTA ABDOMEN AND PELVIS FINDINGS VASCULAR Aorta: Mild atherosclerosis.  No evidence of aneurysm or dissection. Celiac: Patent without evidence of aneurysm, dissection or significant stenosis. SMA: Patent without evidence of aneurysm, dissection or significant stenosis. Replaced right hepatic artery. Renals: Both renal arteries are patent without evidence of aneurysm, dissection or significant stenosis. IMA: Patent without evidence of aneurysm, dissection, vasculitis or significant stenosis. Inflow: Atherosclerosis without evidence of dissection, aneurysm or significant stenosis. Veins: No obvious venous abnormality within the limitations of this arterial phase study. Review of the MIP images confirms the above findings. NON-VASCULAR FINDINGS Hepatobiliary: The liver is normal in density without suspicious focal abnormality. No evidence of gallstones, gallbladder wall thickening or biliary dilatation. Pancreas: Unremarkable. No pancreatic ductal dilatation or surrounding inflammatory changes. Spleen: Progressive enlargement of a low-density lesion centrally in the spleen which now measures 2.6 cm on image 123/8 (previously 2.2 cm). Adrenals/Urinary Tract: Both adrenal glands appear normal. Nonobstructing calculus in the upper pole of the left kidney with adjacent cortical scarring. No evidence of ureteral calculus or hydronephrosis. There are bilateral retroperitoneal phleboliths. The bladder appears normal for its degree of distention. Stomach/Bowel: There is some high density material within  the stomach, presumably ingested. The stomach appears unremarkable for its degree of distension. No evidence of bowel wall thickening, distention or surrounding inflammatory change. The appendix appears normal. Lymphatic: There are no enlarged abdominal or pelvic lymph nodes. Reproductive: Hysterectomy.  No adnexal mass. Other: Stable diastasis of the rectus abdominus muscles. New ill-defined increased density in the subcutaneous fat of the right anterior abdominal wall, likely related to subcutaneous injections. No ascites or free air. Musculoskeletal: No acute or significant osseous findings. Review of the MIP images confirms the above findings. IMPRESSION: 1. No acute vascular findings in the chest, abdomen or pelvis. No evidence of acute aortic syndrome. 2. Indeterminate splenic lesion appears enlarged from recent CT and prior examination from 2021. Consider further characterization with outpatient abdominal MRI (best performed when the patient is able to suspend respiration). 3. Similar distal esophageal wall thickening which may relate to chronic reflux or esophagitis. 4. Coronary artery stent.  Aortic Atherosclerosis (ICD10-I70.0). Electronically Signed   By: Richardean Sale M.D.   On: 08/27/2022 13:45   DG Chest 2 View  Result Date: 08/27/2022 CLINICAL DATA:  Chest pain EXAM: CHEST - 2 VIEW COMPARISON:  07/07/2022 FINDINGS: Cardiomediastinal silhouette and pulmonary vasculature are within  normal limits. Lungs are clear. IMPRESSION: No acute cardiopulmonary process. Electronically Signed   By: Miachel Roux M.D.   On: 08/27/2022 09:10     Assessment and Plan:   Chest pain with stable troponin in pt with hx of CAD and stent to LCX in May this year.  EKG without acute changes.  She has been taking her Brilinta and ASA  CTA of chest without dissection Hypokalemia 2.8 on arrival with 4 runs of 10 meq goal 4.0    CAD in native vessel with 100% OM and stent to LCX in 04/2022.  On ASA and Brilinta HLD on  statin crestor continue.  DM-2 on insulin  per IM has been elevated at home  HTN on amlodipine, olmesartan and HCTz 40-10-25  was elevated on arrival but now stable.    Risk Assessment/Risk Scores:     HEAR Score (for undifferentiated chest pain):        For questions or updates, please contact [facility] Please consult [email or web address] for contact info under  Signed, Cecilie Kicks, NP  08/27/2022 4:31 PM  Patient seen and examined, note reviewed with the signed Advanced Practice Provider. I personally reviewed laboratory data, imaging studies and relevant notes. I independently examined the patient and formulated the important aspects of the plan. I have personally discussed the plan with the patient and/or family. Comments or changes to the note/plan are indicated below.  Patient seen and examined  her bedside in the ED.   CAD - no current symptoms of angina suspect increased trop is demand ischemia. Its flat. Continue her current medical regimen. No plan for any ischemic evaluation at this time. Will reassess if clinical picture changes.   HLN - statin   Thanks for consultation - we will continue to follow.   Berniece Salines DO, MS [facility] Attending Cardiologist [place]  [address] [place], [place] [address] [phone] Website: [email or web address]

## 2022-08-28 DIAGNOSIS — I10 Essential (primary) hypertension: Secondary | ICD-10-CM | POA: Diagnosis not present

## 2022-08-28 DIAGNOSIS — D7389 Other diseases of spleen: Secondary | ICD-10-CM

## 2022-08-28 DIAGNOSIS — F419 Anxiety disorder, unspecified: Secondary | ICD-10-CM

## 2022-08-28 DIAGNOSIS — F3112 Bipolar disorder, current episode manic without psychotic features, moderate: Secondary | ICD-10-CM

## 2022-08-28 DIAGNOSIS — R0789 Other chest pain: Secondary | ICD-10-CM | POA: Diagnosis not present

## 2022-08-28 DIAGNOSIS — G4733 Obstructive sleep apnea (adult) (pediatric): Secondary | ICD-10-CM

## 2022-08-28 DIAGNOSIS — R079 Chest pain, unspecified: Secondary | ICD-10-CM | POA: Diagnosis not present

## 2022-08-28 DIAGNOSIS — I2510 Atherosclerotic heart disease of native coronary artery without angina pectoris: Secondary | ICD-10-CM | POA: Diagnosis not present

## 2022-08-28 DIAGNOSIS — K2100 Gastro-esophageal reflux disease with esophagitis, without bleeding: Secondary | ICD-10-CM

## 2022-08-28 DIAGNOSIS — E1165 Type 2 diabetes mellitus with hyperglycemia: Secondary | ICD-10-CM

## 2022-08-28 DIAGNOSIS — E876 Hypokalemia: Secondary | ICD-10-CM

## 2022-08-28 DIAGNOSIS — F32A Depression, unspecified: Secondary | ICD-10-CM

## 2022-08-28 DIAGNOSIS — E782 Mixed hyperlipidemia: Secondary | ICD-10-CM

## 2022-08-28 LAB — CBC
HCT: 38.6 % (ref 36.0–46.0)
Hemoglobin: 12.6 g/dL (ref 12.0–15.0)
MCH: 26.7 pg (ref 26.0–34.0)
MCHC: 32.6 g/dL (ref 30.0–36.0)
MCV: 81.8 fL (ref 80.0–100.0)
Platelets: 259 10*3/uL (ref 150–400)
RBC: 4.72 MIL/uL (ref 3.87–5.11)
RDW: 16 % — ABNORMAL HIGH (ref 11.5–15.5)
WBC: 11 10*3/uL — ABNORMAL HIGH (ref 4.0–10.5)
nRBC: 0 % (ref 0.0–0.2)

## 2022-08-28 LAB — COMPREHENSIVE METABOLIC PANEL
ALT: 17 U/L (ref 0–44)
AST: 14 U/L — ABNORMAL LOW (ref 15–41)
Albumin: 3.1 g/dL — ABNORMAL LOW (ref 3.5–5.0)
Alkaline Phosphatase: 128 U/L — ABNORMAL HIGH (ref 38–126)
Anion gap: 7 (ref 5–15)
BUN: 7 mg/dL (ref 6–20)
CO2: 19 mmol/L — ABNORMAL LOW (ref 22–32)
Calcium: 9 mg/dL (ref 8.9–10.3)
Chloride: 109 mmol/L (ref 98–111)
Creatinine, Ser: 0.84 mg/dL (ref 0.44–1.00)
GFR, Estimated: >60 mL/min (ref >60)
Glucose, Bld: 292 mg/dL — ABNORMAL HIGH (ref 70–99)
Potassium: 3.3 mmol/L — ABNORMAL LOW (ref 3.5–5.1)
Sodium: 135 mmol/L (ref 135–145)
Total Bilirubin: 0.6 mg/dL (ref 0.3–1.2)
Total Protein: 6.1 g/dL — ABNORMAL LOW (ref 6.5–8.1)

## 2022-08-28 LAB — GLUCOSE, CAPILLARY
Glucose-Capillary: 281 mg/dL — ABNORMAL HIGH (ref 70–99)
Glucose-Capillary: 164 mg/dL — ABNORMAL HIGH (ref 70–99)

## 2022-08-28 LAB — APTT: aPTT: 28 seconds (ref 24–36)

## 2022-08-28 LAB — MAGNESIUM: Magnesium: 2 mg/dL (ref 1.7–2.4)

## 2022-08-28 LAB — PHOSPHORUS: Phosphorus: 2.9 mg/dL (ref 2.5–4.6)

## 2022-08-28 MED ORDER — POTASSIUM CHLORIDE CRYS ER 20 MEQ PO TBCR
40.0000 meq | EXTENDED_RELEASE_TABLET | ORAL | Status: DC
  Administered 2022-08-28: 40 meq via ORAL
  Filled 2022-08-28: qty 2

## 2022-08-28 MED ORDER — BUSPIRONE HCL 5 MG PO TABS
10.0000 mg | ORAL_TABLET | Freq: Three times a day (TID) | ORAL | Status: DC
  Administered 2022-08-28: 10 mg via ORAL
  Filled 2022-08-28: qty 2

## 2022-08-28 MED ORDER — INSULIN ASPART 100 UNIT/ML IJ SOLN: 0.0000 [IU] | Freq: Three times a day (TID) | INTRAMUSCULAR | Status: DC

## 2022-08-28 MED ORDER — ENOXAPARIN SODIUM 40 MG/0.4ML IJ SOSY
40.0000 mg | PREFILLED_SYRINGE | INTRAMUSCULAR | Status: DC
Start: 2022-08-28 — End: 2022-08-28
  Administered 2022-08-28: 40 mg via SUBCUTANEOUS
  Filled 2022-08-28: qty 0.4

## 2022-08-28 MED ORDER — ROSUVASTATIN CALCIUM 5 MG PO TABS
10.0000 mg | ORAL_TABLET | Freq: Every day | ORAL | Status: DC
  Administered 2022-08-28: 10 mg via ORAL
  Filled 2022-08-28: qty 2

## 2022-08-28 MED ORDER — TICAGRELOR 90 MG PO TABS
90.0000 mg | ORAL_TABLET | Freq: Two times a day (BID) | ORAL | Status: DC
  Administered 2022-08-28 (×2): 90 mg via ORAL
  Filled 2022-08-28 (×2): qty 1

## 2022-08-28 MED ORDER — INSULIN GLARGINE-YFGN 100 UNIT/ML ~~LOC~~ SOLN
10.0000 [IU] | Freq: Two times a day (BID) | SUBCUTANEOUS | Status: DC
  Administered 2022-08-28: 10 [IU] via SUBCUTANEOUS
  Filled 2022-08-28 (×2): qty 0.1

## 2022-08-28 MED ORDER — ACETAMINOPHEN 650 MG RE SUPP: 650.0000 mg | Freq: Four times a day (QID) | RECTAL | Status: DC | PRN

## 2022-08-28 MED ORDER — PANTOPRAZOLE SODIUM 40 MG PO TBEC
40.0000 mg | DELAYED_RELEASE_TABLET | Freq: Every day | ORAL | Status: DC
  Administered 2022-08-28: 40 mg via ORAL
  Filled 2022-08-28: qty 1

## 2022-08-28 MED ORDER — NITROGLYCERIN 0.4 MG SL SUBL
0.4000 mg | SUBLINGUAL_TABLET | SUBLINGUAL | Status: DC | PRN
Start: 2022-08-28 — End: 2022-08-28

## 2022-08-28 MED ORDER — TRAZODONE HCL 100 MG PO TABS
200.0000 mg | ORAL_TABLET | Freq: Every day | ORAL | Status: DC
  Filled 2022-08-28: qty 2

## 2022-08-28 MED ORDER — ASPIRIN 81 MG PO CHEW
81.0000 mg | CHEWABLE_TABLET | Freq: Every day | ORAL | Status: DC
  Administered 2022-08-28: 81 mg via ORAL
  Filled 2022-08-28: qty 1

## 2022-08-28 MED ORDER — INSULIN ASPART 100 UNIT/ML IJ SOLN: 0.0000 [IU] | Freq: Every day | INTRAMUSCULAR | Status: DC

## 2022-08-28 MED ORDER — ACETAMINOPHEN 325 MG PO TABS: 650.0000 mg | ORAL_TABLET | Freq: Four times a day (QID) | ORAL | Status: DC | PRN

## 2022-08-28 NOTE — Discharge Summary (Signed)
Physician Discharge Summary  Stephanie Thornton YYT:[id number] DOB: 06/02/1971 DOA: 08/27/2022  PCP: Paschal Dopp, PA  Admit date: 08/27/2022 Discharge date: 08/28/2022 Admitted From: Home Disposition: Home Recommendations for Outpatient Follow-up:  Follow up with PCP in 1 to 2 weeks Cardiology to arrange outpatient follow-up Please follow up on the following pending results: None  Home Health: Not indicated Equipment/Devices: Not indicated  Discharge Condition: Stable CODE STATUS: Full code  Follow-up Information     Paschal Dopp, PA. Schedule an appointment as soon as possible for a visit in 1 week(s).   Specialty: Physician Assistant Contact information: [address] [place] [address] [phone]                 Hospital course 51 year old F with PMH of CAD/recent non-STEMI s/p stent to LCx on 05/07/2022, DM-2, OSA not on CPAP, HTN, HLD, anxiety, depression and bipolar disorder presenting with persistent left-sided chest pain that has started while at rest the night of admission.  Patient took 3 sublingual nitroglycerin and full dose aspirin and called EMS.   In ED, hypertensive to 187/110.  Hypokalemic to 2.8.  Serial troponin basically negative.  CT angio chest, abdomen and pelvis without significant finding other than slightly increased indeterminate splenic lesion, possible esophagitis.  Cardiology consulted and patient was admitted for chest pain evaluation.  Patient remained chest pain-free throughout her hospital stay.  She was evaluated by cardiology and cleared for discharge by cardiology the next day.  Cardiology recommended considering low-dose Imdur if chest pain recurs.  She is already on aspirin and Brilinta.  See individual problem list below for more.   Problems addressed during this hospitalization Principal Problem:   Chest pain Active Problems:   Hypokalemia   Coronary artery disease   Reflux esophagitis   Depression    Essential hypertension   Mixed hyperlipidemia   Leukocytosis   OSA (obstructive sleep apnea)   Bipolar disorder (HCC)   Uncontrolled type 2 diabetes mellitus with hyperglycemia (HCC)   Splenic lesion   Atypical chest pain: Serial troponin negative.  No acute ischemic finding on EKG.  Remained chest pain-free throughout hospital stay.  Cleared for discharge by cardiology.  Patient to continue home Brilinta and aspirin.  Cardiology to arrange outpatient follow-up.  Hypokalemia: Improved.  Received p.o. KCl 40x2 prior to discharge.  Nonspecific splenic lesion: Slightly increased size on CT angio chest/abdomen/pelvis. -Outpatient MRI abdomen recommended  Essential hypertension: Normotensive. -Continue home medications   Mixed hyperlipidemia -Continue rosuvastatin   GERD/chronic reflux esophagitis Continue Protonix   Type 2 diabetes mellitus with uncontrolled hyperglycemia -Continue home meds.   OSA not on CPAP   Anxiety/depression/bipolar disorder: -Continue on trazodone and buspirone           Vital signs Vitals:   08/28/22 0230 08/28/22 0300 08/28/22 0446 08/28/22 0954  BP: 109/73 (!) 144/93 130/84 110/75  Pulse: 72 78 74 87  Temp:   98.2 F (36.8 C) (!) 97.2 F (36.2 C)  Resp: 14 18 14 16   Height:      Weight:      SpO2: 96% 96% 99% 100%  TempSrc:   Oral Axillary  BMI (Calculated):         Discharge exam  GENERAL: No apparent distress.  Nontoxic. HEENT: MMM.  Vision and hearing grossly intact.  NECK: Supple.  No apparent JVD.  RESP:  No IWOB.  Fair aeration bilaterally. CVS:  RRR. Heart sounds normal.  ABD/GI/GU: BS+. Abd soft, NTND.  MSK/EXT:  Moves extremities. No apparent deformity. No edema.  SKIN: no apparent skin lesion or wound NEURO: Awake and alert. Oriented appropriately.  No apparent focal neuro deficit. PSYCH: Calm. Normal affect.   Discharge Instructions Discharge Instructions     Call MD for:  difficulty breathing, headache or visual  disturbances   Complete by: As directed    Call MD for:  extreme fatigue   Complete by: As directed    Call MD for:  persistant dizziness or light-headedness   Complete by: As directed    Call MD for:  severe uncontrolled pain   Complete by: As directed    Diet - low sodium heart healthy   Complete by: As directed    Diet Carb Modified   Complete by: As directed    Discharge instructions   Complete by: As directed    It has been a pleasure taking care of you!  You were hospitalized due to chest pain that seems to have resolved.  Your tests have not shown anything major other than low potassium that has improved with potassium supplementation.  Continue taking your medications as prescribed.  Follow-up with your primary care doctor in 1 to 2 weeks or sooner if needed.  Follow-up with cardiologist per the recommendation.  Of note, you CT chest/abdomen/pelvis showed a splenic lesion that has slightly increased from prior.  You may need a follow-up MRI in few months which can be ordered by your primary care doctor.   Take care,   Increase activity slowly   Complete by: As directed       Allergies as of 08/28/2022       Reactions   Lyrica [pregabalin] Shortness Of Breath, Itching, Rash   Rocephin [ceftriaxone Sodium In Dextrose] Shortness Of Breath   Sulfa Antibiotics Hives, Shortness Of Breath, Itching   Ultram [tramadol] Hives, Shortness Of Breath   Had asthma attack   Vancomycin Anaphylaxis   Per Dr. Flonnie Overmanezai   Glucotrol [glipizide] Hives   Keflex [cephalexin] Hives   Penicillins Hives   Has patient had a PCN reaction causing immediate rash, facial/tongue/throat swelling, SOB or lightheadedness with hypotension: YES Has patient had a PCN reaction causing severe rash involving mucus membranes or skin necrosis: NO Has patient had a PCN reaction that required hospitalization: YES Has patient had a PCN reaction occurring within the last 10 years: YES If all of the above answers are  "NO", then may proceed with Cephalosporin use.   Janumet [sitagliptin-metformin Hcl] Nausea Only        Medication List     TAKE these medications    albuterol 108 (90 Base) MCG/ACT inhaler Commonly known as: VENTOLIN HFA Inhale 1-2 puffs into the lungs every 6 (six) hours as needed for wheezing or shortness of breath. What changed:  how much to take when to take this reasons to take this   amitriptyline 150 MG tablet Commonly known as: ELAVIL Take 150 mg by mouth at bedtime.   amLODipine 10 MG tablet Commonly known as: NORVASC Take 1 tablet (10 mg total) by mouth daily.   aspirin 81 MG chewable tablet Chew 1 tablet (81 mg total) by mouth daily.   baclofen 10 MG tablet Commonly known as: LIORESAL Take 10 mg by mouth 3 (three) times daily.   Brilinta 90 MG Tabs tablet Generic drug: ticagrelor Take 1 tablet (90 mg total) by mouth 2 (two) times daily.   busPIRone 10 MG tablet Commonly known as: BUSPAR Take 10 mg by mouth 3 (  three) times daily.   furosemide 20 MG tablet Commonly known as: LASIX Take 2 tablets by mouth daily X's 3 days then reduce to 1 tablet by mouth daily. What changed:  how much to take how to take this when to take this additional instructions   gabapentin 300 MG capsule Commonly known as: NEURONTIN Take 1 capsule (300 mg total) by mouth 3 (three) times daily.   Latuda 80 MG Tabs tablet Generic drug: lurasidone Take 80 mg by mouth every evening.   metoprolol tartrate 25 MG tablet Commonly known as: LOPRESSOR Take 1 tablet (25 mg total) by mouth 2 (two) times daily.   nitroGLYCERIN 0.4 MG SL tablet Commonly known as: NITROSTAT Place 1 tablet (0.4 mg total) under the tongue every 5 (five) minutes x 3 doses as needed for chest pain.   NovoLOG FlexPen 100 UNIT/ML FlexPen Generic drug: insulin aspart Inject 10-15 Units into the skin 3 (three) times daily as needed for high blood sugar. Sliding Scale   Oxycodone HCl 10 MG Tabs Take 10  mg by mouth 4 (four) times daily as needed (pain).   pantoprazole 40 MG tablet Commonly known as: Protonix Take 1 tablet (40 mg total) by mouth daily.   potassium chloride SA 20 MEQ tablet Commonly known as: KLOR-CON M Take one tablet twice a day for five days, then take  one tablet every day.   rosuvastatin 10 MG tablet Commonly known as: CRESTOR Take 10 mg by mouth at bedtime.   topiramate 200 MG tablet Commonly known as: TOPAMAX Take 200 mg by mouth at bedtime.   traZODone 100 MG tablet Commonly known as: DESYREL Take 200 mg by mouth at bedtime.   Evaristo Bury FlexTouch 100 UNIT/ML FlexTouch Pen Generic drug: insulin degludec Inject 12 Units into the skin at bedtime.   TYLENOL PO Take 2 tablets by mouth 3 (three) times daily as needed (tooth ache).   Vitamin D3 1.25 MG (50000 UT) Caps Take 1 capsule by mouth once a week.        Consultations: Cardiology  Procedures/Studies:   CT Angio Chest/Abd/Pel for Dissection W and/or Wo Contrast  Result Date: 08/27/2022 CLINICAL DATA:  Acute aortic syndrome suspected. History of thyroid nodule with fine needle aspiration 08/21/2022. EXAM: CT ANGIOGRAPHY CHEST, ABDOMEN AND PELVIS TECHNIQUE: Non-contrast CT of the chest was initially obtained. Multidetector CT imaging through the chest, abdomen and pelvis was performed using the standard protocol during bolus administration of intravenous contrast. Multiplanar reconstructed images and MIPs were obtained and reviewed to evaluate the vascular anatomy. RADIATION DOSE REDUCTION: This exam was performed according to the departmental dose-optimization program which includes automated exposure control, adjustment of the mA and/or kV according to patient size and/or use of iterative reconstruction technique. CONTRAST:  OMNIPAQUE IOHEXOL 350 MG/ML SOLN COMPARISON:  Chest, abdomen and pelvis CTA 07/07/2022. Abdominopelvic CT 12/16/2020. FINDINGS: CTA CHEST FINDINGS Cardiovascular: No acute  vascular findings in the chest. Specifically, no evidence of aortic aneurysm, dissection or penetrating ulcer. There is a left circumflex coronary artery stent. No evidence of acute pulmonary embolism. The heart size is normal. There is no pericardial effusion. Mediastinum/Nodes: There are no enlarged mediastinal, hilar or axillary lymph nodes. Small mediastinal lymph nodes are similar to the previous study, likely reactive there is a small hiatal hernia with distal esophageal wall thickening. Heterogeneity of the thyroid gland and left-sided nodule are unchanged from previous CT (biopsy performed in the interval). Lungs/Pleura: No pleural effusion or pneumothorax. Improved aeration of both lung bases.  No airspace disease or suspicious pulmonary nodule. Musculoskeletal/Chest wall: No chest wall mass or suspicious osseous findings. Stable T6 bone island. Review of the MIP images confirms the above findings. CTA ABDOMEN AND PELVIS FINDINGS VASCULAR Aorta: Mild atherosclerosis.  No evidence of aneurysm or dissection. Celiac: Patent without evidence of aneurysm, dissection or significant stenosis. SMA: Patent without evidence of aneurysm, dissection or significant stenosis. Replaced right hepatic artery. Renals: Both renal arteries are patent without evidence of aneurysm, dissection or significant stenosis. IMA: Patent without evidence of aneurysm, dissection, vasculitis or significant stenosis. Inflow: Atherosclerosis without evidence of dissection, aneurysm or significant stenosis. Veins: No obvious venous abnormality within the limitations of this arterial phase study. Review of the MIP images confirms the above findings. NON-VASCULAR FINDINGS Hepatobiliary: The liver is normal in density without suspicious focal abnormality. No evidence of gallstones, gallbladder wall thickening or biliary dilatation. Pancreas: Unremarkable. No pancreatic ductal dilatation or surrounding inflammatory changes. Spleen: Progressive  enlargement of a low-density lesion centrally in the spleen which now measures 2.6 cm on image 123/8 (previously 2.2 cm). Adrenals/Urinary Tract: Both adrenal glands appear normal. Nonobstructing calculus in the upper pole of the left kidney with adjacent cortical scarring. No evidence of ureteral calculus or hydronephrosis. There are bilateral retroperitoneal phleboliths. The bladder appears normal for its degree of distention. Stomach/Bowel: There is some high density material within the stomach, presumably ingested. The stomach appears unremarkable for its degree of distension. No evidence of bowel wall thickening, distention or surrounding inflammatory change. The appendix appears normal. Lymphatic: There are no enlarged abdominal or pelvic lymph nodes. Reproductive: Hysterectomy.  No adnexal mass. Other: Stable diastasis of the rectus abdominus muscles. New ill-defined increased density in the subcutaneous fat of the right anterior abdominal wall, likely related to subcutaneous injections. No ascites or free air. Musculoskeletal: No acute or significant osseous findings. Review of the MIP images confirms the above findings. IMPRESSION: 1. No acute vascular findings in the chest, abdomen or pelvis. No evidence of acute aortic syndrome. 2. Indeterminate splenic lesion appears enlarged from recent CT and prior examination from 2021. Consider further characterization with outpatient abdominal MRI (best performed when the patient is able to suspend respiration). 3. Similar distal esophageal wall thickening which may relate to chronic reflux or esophagitis. 4. Coronary artery stent.  Aortic Atherosclerosis (ICD10-I70.0). Electronically Signed   By: Carey Bullocks M.D.   On: 08/27/2022 13:45   DG Chest 2 View  Result Date: 08/27/2022 CLINICAL DATA:  Chest pain EXAM: CHEST - 2 VIEW COMPARISON:  07/07/2022 FINDINGS: Cardiomediastinal silhouette and pulmonary vasculature are within normal limits. Lungs are clear.  IMPRESSION: No acute cardiopulmonary process. Electronically Signed   By: Acquanetta Belling M.D.   On: 08/27/2022 09:10   [place] FNA BX THYROID 1ST LESION AFIRMA  Result Date: 08/22/2022 INDICATION: Indeterminate thyroid nodule EXAM: ULTRASOUND GUIDED FINE NEEDLE ASPIRATION OF INDETERMINATE THYROID NODULE COMPARISON:  [place] 07/08/22 MEDICATIONS: None COMPLICATIONS: None immediate. TECHNIQUE: Informed written consent was obtained from the patient after a discussion of the risks, benefits and alternatives to treatment. Questions regarding the procedure were encouraged and answered. A timeout was performed prior to the initiation of the procedure. Pre-procedural ultrasound scanning demonstrated slight decrease in size of one of the indeterminate nodules within the isthmus and unchanged appearance of the FNA recommended nodule of the left lobe The procedure was planned. The neck was prepped in the usual sterile fashion, and a sterile drape was applied covering the operative field. A timeout was performed prior to the  initiation of the procedure. Local anesthesia was provided with 1% lidocaine. Under direct ultrasound guidance, 5 FNA biopsies were performed of the superior isthmic nodule with a 25 gauge needle. Multiple ultrasound images were saved for procedural documentation purposes. The samples were prepared and submitted to pathology. Under direct ultrasound guidance, 5 FNA biopsies were performed of the left mid nodule with a 25 gauge needle. Multiple ultrasound images were saved for procedural documentation purposes. The samples were prepared and submitted to pathology. Limited post procedural scanning was negative for hematoma or additional complication. Dressings were placed. The patient tolerated the above procedures procedure well without immediate postprocedural complication. FINDINGS: Nodule reference number based on prior diagnostic ultrasound: 1 Maximum size: 0.8cm Location: superior; isthmus ACR TI-RADS risk  category: TR5 (>/= 7 points) Reason for biopsy: meets ACR TI-RADS criteria _________________________________________________________ Nodule reference number based on prior diagnostic ultrasound: 5 Maximum size: 1.5cm Location: left; mid ACR TI-RADS risk category: TR4 (4-6 points) Reason for biopsy: meets ACR TI-RADS criteria Ultrasound imaging confirms appropriate placement of the needles within the thyroid nodule. IMPRESSION: 1. Technically successful ultrasound guided fine needle aspiration of superior isthmic nodule. 2. Technically successful ultrasound guided fine needle aspiration of left mid nodule Performed and read by Sheliah Plane, PA-C Electronically Signed   By: Acquanetta Belling M.D.   On: 08/22/2022 10:55   [place] FNA BX THYROID 1ST LESION AFIRMA  Result Date: 08/22/2022 INDICATION: Indeterminate thyroid nodule EXAM: ULTRASOUND GUIDED FINE NEEDLE ASPIRATION OF INDETERMINATE THYROID NODULE COMPARISON:  [place] 07/08/22 MEDICATIONS: None COMPLICATIONS: None immediate. TECHNIQUE: Informed written consent was obtained from the patient after a discussion of the risks, benefits and alternatives to treatment. Questions regarding the procedure were encouraged and answered. A timeout was performed prior to the initiation of the procedure. Pre-procedural ultrasound scanning demonstrated slight decrease in size of one of the indeterminate nodules within the isthmus and unchanged appearance of the FNA recommended nodule of the left lobe The procedure was planned. The neck was prepped in the usual sterile fashion, and a sterile drape was applied covering the operative field. A timeout was performed prior to the initiation of the procedure. Local anesthesia was provided with 1% lidocaine. Under direct ultrasound guidance, 5 FNA biopsies were performed of the superior isthmic nodule with a 25 gauge needle. Multiple ultrasound images were saved for procedural documentation purposes. The samples were prepared and submitted to  pathology. Under direct ultrasound guidance, 5 FNA biopsies were performed of the left mid nodule with a 25 gauge needle. Multiple ultrasound images were saved for procedural documentation purposes. The samples were prepared and submitted to pathology. Limited post procedural scanning was negative for hematoma or additional complication. Dressings were placed. The patient tolerated the above procedures procedure well without immediate postprocedural complication. FINDINGS: Nodule reference number based on prior diagnostic ultrasound: 1 Maximum size: 0.8cm Location: superior; isthmus ACR TI-RADS risk category: TR5 (>/= 7 points) Reason for biopsy: meets ACR TI-RADS criteria _________________________________________________________ Nodule reference number based on prior diagnostic ultrasound: 5 Maximum size: 1.5cm Location: left; mid ACR TI-RADS risk category: TR4 (4-6 points) Reason for biopsy: meets ACR TI-RADS criteria Ultrasound imaging confirms appropriate placement of the needles within the thyroid nodule. IMPRESSION: 1. Technically successful ultrasound guided fine needle aspiration of superior isthmic nodule. 2. Technically successful ultrasound guided fine needle aspiration of left mid nodule Performed and read by Sheliah Plane, PA-C Electronically Signed   By: Acquanetta Belling M.D.   On: 08/22/2022 10:55       The results  of significant diagnostics from this hospitalization (including imaging, microbiology, ancillary and laboratory) are listed below for reference.     Microbiology: No results found for this or any previous visit (from the past 240 hour(s)).   Labs:  CBC: Recent Labs  Lab 08/27/22 0834 08/28/22 0117  WBC 12.0* 11.0*  HGB 15.6* 12.6  HCT 48.1* 38.6  MCV 81.8 81.8  PLT 297 259   BMP &GFR Recent Labs  Lab 08/27/22 0834 08/28/22 0117  NA 136 135  K 2.8* 3.3*  CL 103 109  CO2 22 19*  GLUCOSE 325* 292*  BUN 7 7  CREATININE 0.89 0.84  CALCIUM 9.7 9.0  MG 1.8 2.0   PHOS  --  2.9   Estimated Creatinine Clearance: 71.3 mL/min (by C-G formula based on SCr of 0.84 mg/dL). Liver & Pancreas: Recent Labs  Lab 08/27/22 0834 08/28/22 0117  AST 15 14*  ALT 22 17  ALKPHOS 165* 128*  BILITOT 0.4 0.6  PROT 8.2* 6.1*  ALBUMIN 4.2 3.1*   Recent Labs  Lab 08/27/22 1010  LIPASE 24   No results for input(s): "AMMONIA" in the last 168 hours. Diabetic: No results for input(s): "HGBA1C" in the last 72 hours. Recent Labs  Lab 08/27/22 1214 08/27/22 2332 08/28/22 0801 08/28/22 1153  GLUCAP 233* 197* 281* 164*   Cardiac Enzymes: No results for input(s): "CKTOTAL", "CKMB", "CKMBINDEX", "TROPONINI" in the last 168 hours. Recent Labs    05/22/22 1006 06/05/22 0831  PROBNP 951* 726*   Coagulation Profile: No results for input(s): "INR", "PROTIME" in the last 168 hours. Thyroid Function Tests: No results for input(s): "TSH", "T4TOTAL", "FREET4", "T3FREE", "THYROIDAB" in the last 72 hours. Lipid Profile: No results for input(s): "CHOL", "HDL", "LDLCALC", "TRIG", "CHOLHDL", "LDLDIRECT" in the last 72 hours. Anemia Panel: No results for input(s): "VITAMINB12", "FOLATE", "FERRITIN", "TIBC", "IRON", "RETICCTPCT" in the last 72 hours. Urine analysis:    Component Value Date/Time   COLORURINE YELLOW 07/17/2022 0413   APPEARANCEUR HAZY (A) 07/17/2022 0413   LABSPEC 1.012 07/17/2022 0413   PHURINE 6.0 07/17/2022 0413   GLUCOSEU >=500 (A) 07/17/2022 0413   HGBUR NEGATIVE 07/17/2022 0413   BILIRUBINUR NEGATIVE 07/17/2022 0413   BILIRUBINUR negative 03/04/2019 0950   BILIRUBINUR neg 02/13/2018 1219   KETONESUR NEGATIVE 07/17/2022 0413   PROTEINUR NEGATIVE 07/17/2022 0413   UROBILINOGEN 0.2 12/05/2020 1151   NITRITE NEGATIVE 07/17/2022 0413   LEUKOCYTESUR NEGATIVE 07/17/2022 0413   Sepsis Labs: Invalid input(s): "PROCALCITONIN", "LACTICIDVEN"   SIGNED:  Almon Hercules, MD  [facility] 08/28/2022, 5:54 PM

## 2022-08-28 NOTE — Progress Notes (Signed)
Rounding Note    Patient Name: Stephanie Thornton Date of Encounter: 08/28/2022  [facility] Cardiologist: Armanda Magic, MD   Subjective   Patient seen examined her bedside.  She was sleeping when I arrived.  She open her eyes to voice.  No complaints at this time.  Inpatient Medications    Scheduled Meds:  amitriptyline  150 mg Oral QHS   amLODipine  10 mg Oral Daily   aspirin  81 mg Oral Daily   baclofen  10 mg Oral TID   busPIRone  10 mg Oral TID   enoxaparin (LOVENOX) injection  40 mg Subcutaneous Q24H   furosemide  20 mg Oral Daily   gabapentin  300 mg Oral TID   insulin aspart  0-5 Units Subcutaneous QHS   insulin aspart  0-9 Units Subcutaneous TID WC   insulin glargine-yfgn  10 Units Subcutaneous QHS   lurasidone  80 mg Oral QPM   metoprolol tartrate  25 mg Oral BID   pantoprazole  40 mg Oral Daily   rosuvastatin  10 mg Oral QHS   ticagrelor  90 mg Oral BID   topiramate  200 mg Oral QHS   traZODone  200 mg Oral QHS   Continuous Infusions:  PRN Meds: acetaminophen **OR** acetaminophen, albuterol, nitroGLYCERIN   Vital Signs    Vitals:   08/28/22 0200 08/28/22 0230 08/28/22 0300 08/28/22 0446  BP: 129/85 109/73 (!) 144/93 130/84  Pulse: 76 72 78 74  Resp: 18 14 18 14   Temp:    98.2 F (36.8 C)  TempSrc:    Oral  SpO2: 97% 96% 96% 99%  Weight:      Height:        Intake/Output Summary (Last 24 hours) at 08/28/2022 0805 Last data filed at 08/27/2022 1349 Gross per 24 hour  Intake 1150 ml  Output --  Net 1150 ml      08/28/2022   12:00 AM 08/27/2022    8:02 AM 07/07/2022    6:44 PM  Last 3 Weights  Weight (lbs) 152 lb 12.8 oz 147 lb 162 lb 4.1 oz  Weight (kg) 69.31 kg 66.679 kg 73.6 kg      Telemetry    Sinus rhythm- Personally Reviewed  ECG    None today - Personally Reviewed  Physical Exam   GEN: No acute distress.   Neck: No JVD Cardiac: RRR, no murmurs, rubs, or gallops.  Respiratory: Clear to auscultation  bilaterally. GI: Soft, nontender, non-distended  MS: No edema; No deformity. Neuro:  Nonfocal  Psych: Normal affect   Labs    High Sensitivity Troponin:   Recent Labs  Lab 08/27/22 0834 08/27/22 1013 08/27/22 1548 08/27/22 1843  TROPONINIHS 15 16 21* 17     Chemistry Recent Labs  Lab 08/27/22 0834 08/28/22 0117  NA 136 135  K 2.8* 3.3*  CL 103 109  CO2 22 19*  GLUCOSE 325* 292*  BUN 7 7  CREATININE 0.89 0.84  CALCIUM 9.7 9.0  MG 1.8 2.0  PROT 8.2* 6.1*  ALBUMIN 4.2 3.1*  AST 15 14*  ALT 22 17  ALKPHOS 165* 128*  BILITOT 0.4 0.6  GFRNONAA >60 >60  ANIONGAP 11 7    Lipids No results for input(s): "CHOL", "TRIG", "HDL", "LABVLDL", "LDLCALC", "CHOLHDL" in the last 168 hours.  Hematology Recent Labs  Lab 08/27/22 0834 08/28/22 0117  WBC 12.0* 11.0*  RBC 5.88* 4.72  HGB 15.6* 12.6  HCT 48.1* 38.6  MCV 81.8 81.8  MCH 26.5 26.7  MCHC 32.4 32.6  RDW 15.9* 16.0*  PLT 297 259   Thyroid No results for input(s): "TSH", "FREET4" in the last 168 hours.  BNPNo results for input(s): "BNP", "PROBNP" in the last 168 hours.  DDimer No results for input(s): "DDIMER" in the last 168 hours.   Radiology    CT Angio Chest/Abd/Pel for Dissection W and/or Wo Contrast  Result Date: 08/27/2022 CLINICAL DATA:  Acute aortic syndrome suspected. History of thyroid nodule with fine needle aspiration 08/21/2022. EXAM: CT ANGIOGRAPHY CHEST, ABDOMEN AND PELVIS TECHNIQUE: Non-contrast CT of the chest was initially obtained. Multidetector CT imaging through the chest, abdomen and pelvis was performed using the standard protocol during bolus administration of intravenous contrast. Multiplanar reconstructed images and MIPs were obtained and reviewed to evaluate the vascular anatomy. RADIATION DOSE REDUCTION: This exam was performed according to the departmental dose-optimization program which includes automated exposure control, adjustment of the mA and/or kV according to patient size and/or  use of iterative reconstruction technique. CONTRAST:  OMNIPAQUE IOHEXOL 350 MG/ML SOLN COMPARISON:  Chest, abdomen and pelvis CTA 07/07/2022. Abdominopelvic CT 12/16/2020. FINDINGS: CTA CHEST FINDINGS Cardiovascular: No acute vascular findings in the chest. Specifically, no evidence of aortic aneurysm, dissection or penetrating ulcer. There is a left circumflex coronary artery stent. No evidence of acute pulmonary embolism. The heart size is normal. There is no pericardial effusion. Mediastinum/Nodes: There are no enlarged mediastinal, hilar or axillary lymph nodes. Small mediastinal lymph nodes are similar to the previous study, likely reactive there is a small hiatal hernia with distal esophageal wall thickening. Heterogeneity of the thyroid gland and left-sided nodule are unchanged from previous CT (biopsy performed in the interval). Lungs/Pleura: No pleural effusion or pneumothorax. Improved aeration of both lung bases. No airspace disease or suspicious pulmonary nodule. Musculoskeletal/Chest wall: No chest wall mass or suspicious osseous findings. Stable T6 bone island. Review of the MIP images confirms the above findings. CTA ABDOMEN AND PELVIS FINDINGS VASCULAR Aorta: Mild atherosclerosis.  No evidence of aneurysm or dissection. Celiac: Patent without evidence of aneurysm, dissection or significant stenosis. SMA: Patent without evidence of aneurysm, dissection or significant stenosis. Replaced right hepatic artery. Renals: Both renal arteries are patent without evidence of aneurysm, dissection or significant stenosis. IMA: Patent without evidence of aneurysm, dissection, vasculitis or significant stenosis. Inflow: Atherosclerosis without evidence of dissection, aneurysm or significant stenosis. Veins: No obvious venous abnormality within the limitations of this arterial phase study. Review of the MIP images confirms the above findings. NON-VASCULAR FINDINGS Hepatobiliary: The liver is normal in density  without suspicious focal abnormality. No evidence of gallstones, gallbladder wall thickening or biliary dilatation. Pancreas: Unremarkable. No pancreatic ductal dilatation or surrounding inflammatory changes. Spleen: Progressive enlargement of a low-density lesion centrally in the spleen which now measures 2.6 cm on image 123/8 (previously 2.2 cm). Adrenals/Urinary Tract: Both adrenal glands appear normal. Nonobstructing calculus in the upper pole of the left kidney with adjacent cortical scarring. No evidence of ureteral calculus or hydronephrosis. There are bilateral retroperitoneal phleboliths. The bladder appears normal for its degree of distention. Stomach/Bowel: There is some high density material within the stomach, presumably ingested. The stomach appears unremarkable for its degree of distension. No evidence of bowel wall thickening, distention or surrounding inflammatory change. The appendix appears normal. Lymphatic: There are no enlarged abdominal or pelvic lymph nodes. Reproductive: Hysterectomy.  No adnexal mass. Other: Stable diastasis of the rectus abdominus muscles. New ill-defined increased density in the subcutaneous fat of the right anterior  abdominal wall, likely related to subcutaneous injections. No ascites or free air. Musculoskeletal: No acute or significant osseous findings. Review of the MIP images confirms the above findings. IMPRESSION: 1. No acute vascular findings in the chest, abdomen or pelvis. No evidence of acute aortic syndrome. 2. Indeterminate splenic lesion appears enlarged from recent CT and prior examination from 2021. Consider further characterization with outpatient abdominal MRI (best performed when the patient is able to suspend respiration). 3. Similar distal esophageal wall thickening which may relate to chronic reflux or esophagitis. 4. Coronary artery stent.  Aortic Atherosclerosis (ICD10-I70.0). Electronically Signed   By: Richardean Sale M.D.   On: 08/27/2022 13:45    DG Chest 2 View  Result Date: 08/27/2022 CLINICAL DATA:  Chest pain EXAM: CHEST - 2 VIEW COMPARISON:  07/07/2022 FINDINGS: Cardiomediastinal silhouette and pulmonary vasculature are within normal limits. Lungs are clear. IMPRESSION: No acute cardiopulmonary process. Electronically Signed   By: Miachel Roux M.D.   On: 08/27/2022 09:10    Cardiac Studies   Echocardiogram May 2023 IMPRESSIONS   1. Left ventricular ejection fraction, by estimation, is 50 to 55%. The  left ventricle has low normal function. The left ventricle demonstrates  regional wall motion abnormalities (see scoring diagram/findings for  description). There is mild concentric  left ventricular hypertrophy. Indeterminate diastolic filling due to E-A  fusion.   2. Right ventricular systolic function is normal. The right ventricular  size is normal. There is mildly elevated pulmonary artery systolic  pressure. The estimated right ventricular systolic pressure is 123XX123 mmHg.   3. The mitral valve is grossly normal. Trivial mitral valve  regurgitation. No evidence of mitral stenosis.   4. The aortic valve is tricuspid. Aortic valve regurgitation is not  visualized. No aortic stenosis is present.   5. The inferior vena cava is dilated in size with <50% respiratory  variability, suggesting right atrial pressure of 15 mmHg.   FINDINGS   Left Ventricle: Left ventricular ejection fraction, by estimation, is 50  to 55%. The left ventricle has low normal function. The left ventricle  demonstrates regional wall motion abnormalities. The left ventricular  internal cavity size was normal in  size. There is mild concentric left ventricular hypertrophy. Indeterminate  diastolic filling due to E-A fusion.   Patient Profile     51 y.o. female with a hx of CAD, NSTEMI, 04/2022 PCI, HTN, DM-2, HLD, OSA on CPAP though not compliant, asthma, hx of CVA + tobacco, Bi polar disorder.   Assessment & Plan    CAD with CAD - with resolution  yesterday evening - no recurrence overnight. Chest pain free this morning.  Troponin flat.  No need for any further ischemic evaluation at this time.  Continuing patient on her dual antiplatelet therapy with aspirin and Brilinta.  Continue rosuvastatin.  If chest pain recurred recommend starting antianginals with Imdur 30 mg daily.  Electrolyte abnormalities-potassium improved compared to yesterday 2.8 today 3.3.  Will need more replacement.  Keep K above 4 and mag above 2.  Hyperlipidemia-continue statin  Diabetes mellitus-being managed by the primary team.  Hypertension-Home regimen.   For questions or updates, please contact [place] Please consult [email or web address] for contact info under        Signed, Rashay Barnette, DO  08/28/2022, 8:05 AM

## 2022-09-09 ENCOUNTER — Encounter (HOSPITAL_COMMUNITY): Payer: Self-pay

## 2022-09-23 ENCOUNTER — Other Ambulatory Visit: Payer: Self-pay

## 2022-09-23 ENCOUNTER — Other Ambulatory Visit: Payer: Self-pay | Admitting: Home Health

## 2022-09-23 ENCOUNTER — Other Ambulatory Visit (HOSPITAL_COMMUNITY): Payer: Self-pay

## 2022-09-23 MED ORDER — AMLODIPINE BESYLATE 10 MG PO TABS
10.0000 mg | ORAL_TABLET | Freq: Every day | ORAL | 2 refills | Status: DC
  Filled 2022-09-23: qty 90, 90d supply, fill #0

## 2022-09-23 MED ORDER — METOPROLOL TARTRATE 25 MG PO TABS
25.0000 mg | ORAL_TABLET | Freq: Two times a day (BID) | ORAL | 2 refills | Status: DC
  Filled 2022-09-23: qty 180, 90d supply, fill #0

## 2022-09-24 ENCOUNTER — Other Ambulatory Visit (HOSPITAL_COMMUNITY): Payer: Self-pay

## 2022-09-25 ENCOUNTER — Other Ambulatory Visit (HOSPITAL_COMMUNITY): Payer: Self-pay

## 2022-09-25 MED ORDER — PANTOPRAZOLE SODIUM 40 MG PO TBEC
40.0000 mg | DELAYED_RELEASE_TABLET | Freq: Every day | ORAL | 0 refills | Status: DC
  Filled 2022-09-25: qty 30, 30d supply, fill #0

## 2022-09-26 ENCOUNTER — Other Ambulatory Visit (HOSPITAL_COMMUNITY): Payer: Self-pay

## 2022-10-06 ENCOUNTER — Emergency Department (HOSPITAL_COMMUNITY): Payer: Medicare HMO

## 2022-10-06 ENCOUNTER — Encounter (HOSPITAL_COMMUNITY): Payer: Self-pay

## 2022-10-06 ENCOUNTER — Emergency Department (HOSPITAL_COMMUNITY)
Admission: EM | Admit: 2022-10-06 | Discharge: 2022-10-06 | Disposition: A | Discharge status: Home / Self Care | Payer: Medicare HMO | Attending: Emergency Medicine | Admitting: Emergency Medicine

## 2022-10-06 ENCOUNTER — Other Ambulatory Visit: Payer: Self-pay

## 2022-10-06 DIAGNOSIS — N3000 Acute cystitis without hematuria: Secondary | ICD-10-CM

## 2022-10-06 DIAGNOSIS — M25551 Pain in right hip: Secondary | ICD-10-CM | POA: Insufficient documentation

## 2022-10-06 DIAGNOSIS — E876 Hypokalemia: Secondary | ICD-10-CM

## 2022-10-06 DIAGNOSIS — Z9189 Other specified personal risk factors, not elsewhere classified: Secondary | ICD-10-CM

## 2022-10-06 DIAGNOSIS — Z794 Long term (current) use of insulin: Secondary | ICD-10-CM | POA: Insufficient documentation

## 2022-10-06 DIAGNOSIS — R519 Headache, unspecified: Secondary | ICD-10-CM | POA: Insufficient documentation

## 2022-10-06 DIAGNOSIS — Z7984 Long term (current) use of oral hypoglycemic drugs: Secondary | ICD-10-CM | POA: Diagnosis not present

## 2022-10-06 DIAGNOSIS — Z7982 Long term (current) use of aspirin: Secondary | ICD-10-CM | POA: Insufficient documentation

## 2022-10-06 DIAGNOSIS — I2510 Atherosclerotic heart disease of native coronary artery without angina pectoris: Secondary | ICD-10-CM | POA: Diagnosis not present

## 2022-10-06 DIAGNOSIS — M542 Cervicalgia: Secondary | ICD-10-CM | POA: Diagnosis not present

## 2022-10-06 DIAGNOSIS — R739 Hyperglycemia, unspecified: Secondary | ICD-10-CM

## 2022-10-06 DIAGNOSIS — E119 Type 2 diabetes mellitus without complications: Secondary | ICD-10-CM | POA: Diagnosis not present

## 2022-10-06 DIAGNOSIS — Z79899 Other long term (current) drug therapy: Secondary | ICD-10-CM | POA: Diagnosis not present

## 2022-10-06 DIAGNOSIS — I10 Essential (primary) hypertension: Secondary | ICD-10-CM | POA: Insufficient documentation

## 2022-10-06 DIAGNOSIS — N9489 Other specified conditions associated with female genital organs and menstrual cycle: Secondary | ICD-10-CM | POA: Insufficient documentation

## 2022-10-06 LAB — I-STAT VENOUS BLOOD GAS, ED
Acid-Base Excess: 3 mmol/L — ABNORMAL HIGH (ref 0.0–2.0)
Bicarbonate: 29.5 mmol/L — ABNORMAL HIGH (ref 20.0–28.0)
Calcium, Ion: 1.21 mmol/L (ref 1.15–1.40)
HCT: 49 % — ABNORMAL HIGH (ref 36.0–46.0)
Hemoglobin: 16.7 g/dL — ABNORMAL HIGH (ref 12.0–15.0)
O2 Saturation: 82 %
Potassium: 2.9 mmol/L — ABNORMAL LOW (ref 3.5–5.1)
Sodium: 129 mmol/L — ABNORMAL LOW (ref 135–145)
TCO2: 31 mmol/L (ref 22–32)
pCO2, Ven: 51.3 mmHg (ref 44–60)
pH, Ven: 7.368 (ref 7.25–7.43)
pO2, Ven: 48 mmHg — ABNORMAL HIGH (ref 32–45)

## 2022-10-06 LAB — URINALYSIS, COMPLETE (UACMP) WITH MICROSCOPIC
Bilirubin Urine: NEGATIVE
Glucose, UA: >=500 mg/dL — AB
Hgb urine dipstick: NEGATIVE
Ketones, ur: 5 mg/dL — AB
Nitrite: POSITIVE — AB
Protein, ur: NEGATIVE mg/dL
Specific Gravity, Urine: 1.029 (ref 1.005–1.030)
pH: 7 (ref 5.0–8.0)

## 2022-10-06 LAB — COMPREHENSIVE METABOLIC PANEL
ALT: 15 U/L (ref 0–44)
AST: 15 U/L (ref 15–41)
Albumin: 4.2 g/dL (ref 3.5–5.0)
Alkaline Phosphatase: 166 U/L — ABNORMAL HIGH (ref 38–126)
Anion gap: 15 (ref 5–15)
BUN: 32 mg/dL — ABNORMAL HIGH (ref 6–20)
CO2: 25 mmol/L (ref 22–32)
Calcium: 10.2 mg/dL (ref 8.9–10.3)
Chloride: 87 mmol/L — ABNORMAL LOW (ref 98–111)
Creatinine, Ser: 1.33 mg/dL — ABNORMAL HIGH (ref 0.44–1.00)
GFR, Estimated: 49 mL/min — ABNORMAL LOW (ref >60)
Glucose, Bld: 423 mg/dL — ABNORMAL HIGH (ref 70–99)
Potassium: 2.8 mmol/L — ABNORMAL LOW (ref 3.5–5.1)
Sodium: 127 mmol/L — ABNORMAL LOW (ref 135–145)
Total Bilirubin: 0.9 mg/dL (ref 0.3–1.2)
Total Protein: 8 g/dL (ref 6.5–8.1)

## 2022-10-06 LAB — LACTIC ACID, PLASMA: Lactic Acid, Venous: 1.7 mmol/L (ref 0.5–1.9)

## 2022-10-06 LAB — CBC WITH DIFFERENTIAL/PLATELET
Abs Immature Granulocytes: 0.04 10*3/uL (ref 0.00–0.07)
Basophils Absolute: 0 10*3/uL (ref 0.0–0.1)
Basophils Relative: 1 %
Eosinophils Absolute: 0.1 10*3/uL (ref 0.0–0.5)
Eosinophils Relative: 2 %
HCT: 43.5 % (ref 36.0–46.0)
Hemoglobin: 15.1 g/dL — ABNORMAL HIGH (ref 12.0–15.0)
Immature Granulocytes: 1 %
Lymphocytes Relative: 30 %
Lymphs Abs: 2.6 10*3/uL (ref 0.7–4.0)
MCH: 27.2 pg (ref 26.0–34.0)
MCHC: 34.7 g/dL (ref 30.0–36.0)
MCV: 78.2 fL — ABNORMAL LOW (ref 80.0–100.0)
Monocytes Absolute: 0.7 10*3/uL (ref 0.1–1.0)
Monocytes Relative: 8 %
Neutro Abs: 5.3 10*3/uL (ref 1.7–7.7)
Neutrophils Relative %: 58 %
Platelets: 248 10*3/uL (ref 150–400)
RBC: 5.56 MIL/uL — ABNORMAL HIGH (ref 3.87–5.11)
RDW: 13.6 % (ref 11.5–15.5)
WBC: 8.8 10*3/uL (ref 4.0–10.5)
nRBC: 0 % (ref 0.0–0.2)

## 2022-10-06 LAB — BASIC METABOLIC PANEL
Anion gap: 18 — ABNORMAL HIGH (ref 5–15)
BUN: 31 mg/dL — ABNORMAL HIGH (ref 6–20)
CO2: 20 mmol/L — ABNORMAL LOW (ref 22–32)
Calcium: 9.7 mg/dL (ref 8.9–10.3)
Chloride: 94 mmol/L — ABNORMAL LOW (ref 98–111)
Creatinine, Ser: 1.16 mg/dL — ABNORMAL HIGH (ref 0.44–1.00)
GFR, Estimated: 57 mL/min — ABNORMAL LOW (ref >60)
Glucose, Bld: 316 mg/dL — ABNORMAL HIGH (ref 70–99)
Potassium: 3.9 mmol/L (ref 3.5–5.1)
Sodium: 132 mmol/L — ABNORMAL LOW (ref 135–145)

## 2022-10-06 LAB — RAPID URINE DRUG SCREEN, HOSP PERFORMED
Amphetamines: NOT DETECTED
Barbiturates: NOT DETECTED
Benzodiazepines: NOT DETECTED
Cocaine: NOT DETECTED
Opiates: NOT DETECTED
Tetrahydrocannabinol: NOT DETECTED

## 2022-10-06 LAB — I-STAT CHEM 8, ED
BUN: 36 mg/dL — ABNORMAL HIGH (ref 6–20)
Calcium, Ion: 1.22 mmol/L (ref 1.15–1.40)
Chloride: 91 mmol/L — ABNORMAL LOW (ref 98–111)
Creatinine, Ser: 1.3 mg/dL — ABNORMAL HIGH (ref 0.44–1.00)
Glucose, Bld: 417 mg/dL — ABNORMAL HIGH (ref 70–99)
HCT: 51 % — ABNORMAL HIGH (ref 36.0–46.0)
Hemoglobin: 17.3 g/dL — ABNORMAL HIGH (ref 12.0–15.0)
Potassium: 2.9 mmol/L — ABNORMAL LOW (ref 3.5–5.1)
Sodium: 131 mmol/L — ABNORMAL LOW (ref 135–145)
TCO2: 28 mmol/L (ref 22–32)

## 2022-10-06 LAB — I-STAT BETA HCG BLOOD, ED (MC, WL, AP ONLY): I-stat hCG, quantitative: <5 m[IU]/mL (ref <5)

## 2022-10-06 LAB — CBG MONITORING, ED
Glucose-Capillary: 445 mg/dL — ABNORMAL HIGH (ref 70–99)
Glucose-Capillary: 267 mg/dL — ABNORMAL HIGH (ref 70–99)
Glucose-Capillary: 351 mg/dL — ABNORMAL HIGH (ref 70–99)

## 2022-10-06 LAB — ETHANOL: Alcohol, Ethyl (B): <10 mg/dL (ref <10)

## 2022-10-06 LAB — BETA-HYDROXYBUTYRIC ACID: Beta-Hydroxybutyric Acid: 0.58 mmol/L — ABNORMAL HIGH (ref 0.05–0.27)

## 2022-10-06 LAB — MAGNESIUM: Magnesium: 2.2 mg/dL (ref 1.7–2.4)

## 2022-10-06 MED ORDER — NITROFURANTOIN MONOHYD MACRO 100 MG PO CAPS: 100.0000 mg | ORAL_CAPSULE | Freq: Two times a day (BID) | ORAL | 0 refills | Status: DC

## 2022-10-06 MED ORDER — DEXTROSE IN LACTATED RINGERS 5 % IV SOLN: INTRAVENOUS | Status: DC

## 2022-10-06 MED ORDER — POTASSIUM CHLORIDE 10 MEQ/100ML IV SOLN
10.0000 meq | INTRAVENOUS | Status: DC
  Administered 2022-10-06: 10 meq via INTRAVENOUS
  Filled 2022-10-06 (×4): qty 100

## 2022-10-06 MED ORDER — LACTATED RINGERS IV SOLN: INTRAVENOUS | Status: DC

## 2022-10-06 MED ORDER — LACTATED RINGERS IV BOLUS
20.0000 mL/kg | Freq: Once | INTRAVENOUS | Status: AC
Start: 2022-10-06 — End: 2022-10-06
  Administered 2022-10-06: 1542 mL via INTRAVENOUS

## 2022-10-06 MED ORDER — DEXTROSE 50 % IV SOLN: 0.0000 mL | INTRAVENOUS | Status: DC | PRN

## 2022-10-06 MED ORDER — POTASSIUM CHLORIDE 10 MEQ/100ML IV SOLN
10.0000 meq | INTRAVENOUS | Status: AC
  Administered 2022-10-06: 10 meq via INTRAVENOUS

## 2022-10-06 MED ORDER — MAGNESIUM SULFATE 2 GM/50ML IV SOLN
2.0000 g | Freq: Once | INTRAVENOUS | Status: AC
  Administered 2022-10-06: 2 g via INTRAVENOUS
  Filled 2022-10-06: qty 50

## 2022-10-06 MED ORDER — INSULIN REGULAR(HUMAN) IN NACL 100-0.9 UT/100ML-% IV SOLN
INTRAVENOUS | Status: DC
  Administered 2022-10-06: 11.2 [IU]/h via INTRAVENOUS
  Filled 2022-10-06: qty 100

## 2022-10-06 MED ORDER — NITROFURANTOIN MONOHYD MACRO 100 MG PO CAPS
100.0000 mg | ORAL_CAPSULE | Freq: Once | ORAL | Status: AC
  Administered 2022-10-06: 100 mg via ORAL
  Filled 2022-10-06: qty 1

## 2022-10-06 NOTE — ED Triage Notes (Signed)
[place] getting out bed and does not remember the event.  History of fall 2 days ago striking her head.

## 2022-10-06 NOTE — ED Provider Notes (Signed)
Patient signed out to me at 0700 by Dr. Leonette Monarch pending repeat BMP and reassessment.  In short this is a 51 year old female with a past medical history of CAD status post stent placement, hypertension and diabetes, anxiety presenting to the emergency department after a fall.  Patient does not remember the fall but does report taking trazodone and amitriptyline last night to help with sleeping.  She was found to be hypokalemic and hyperglycemic without evidence of DKA.  She was given IV fluids and potassium repletion.  Repeat BMP and urine are pending at the time of signout.  Upon my evaluation she is asleep in bed.  She is arousable to noxious stimuli and has no acute complaints.  She quickly falls back asleep.  Heart is regular rate and rhythm and lungs are clear to auscultation.   Kemper Durie, DO 10/06/22 [phone]

## 2022-10-06 NOTE — ED Provider Notes (Signed)
[facility] EMERGENCY DEPARTMENT Provider Note  CSN: [id number] Arrival date & time: 10/06/22 0100  Chief Complaint(s) Fall  HPI Stephanie Thornton is a 51 y.o. female with CAD with recent NSTEMI s/p to LCx 05/07/2022, hypertension, hyperlipidemia, type 2 diabetes mellitus (IDDM), OSA not on CPAP, anxiety/depression/ bipolar disorder who presents to the emergency department after being found down on the mid level stair landing.  Patient does not remember how she got there. She is complaining of facial pain and neck pain.  She is also endorsing right hip pain.  No other extremity pain.  No chest pain or shortness of breath.  Patient denies any recent fevers or infections.  No coughing or congestion.  States that she is compliant with her home medications. Patient is lethargic and slow to answer questions but oriented x 3.  Urine is sent on 18  The history is provided by the patient.    Past Medical History Past Medical History:  Diagnosis Date   Anxiety    Arthritis    "knees, feet, back" (05/24/2015)   Benign essential HTN 05/01/2015   Bipolar disorder (HCC) diagnosed early 90s   CAD in native artery    Chronic bronchitis (HCC)    "get it q yr"   Chronic lower back pain    Depression    Diabetes mellitus type II 2010   GERD (gastroesophageal reflux disease)    Headache    "maybe 3 times/wk" (05/24/2015)   History of hiatal hernia    History of stomach ulcers    HLD (hyperlipidemia)    Menopause    Migraine    "1-2/wk" (05/24/2015)   NSTEMI (non-ST elevated myocardial infarction) (HCC) 04/2022   OSA on CPAP    Patient Active Problem List   Diagnosis Date Noted   Uncontrolled type 2 diabetes mellitus with hyperglycemia (HCC) 08/27/2022   Splenic lesion 08/27/2022   DKA (diabetic ketoacidosis) (HCC) 07/10/2022   Diabetic ketoacidosis without coma associated with type 2 diabetes mellitus (HCC) 07/07/2022   Acute pancreatitis without infection or necrosis  07/07/2022   AKI (acute kidney injury) (HCC) 07/07/2022   Hypokalemia 07/07/2022   Hypercalcemia 07/07/2022   Coronary artery disease 07/07/2022   Thyroid nodule 07/07/2022   Leukocytosis 07/07/2022   OSA (obstructive sleep apnea)    Reflux esophagitis    Weakness 05/22/2022   Shortness of breath 05/22/2022   Mixed hyperlipidemia 05/21/2022   NSTEMI (non-ST elevated myocardial infarction) (HCC) 05/07/2022   Essential hypertension    Depression    Diabetes type 2, controlled (HCC)    Chronic tonsillitis 05/24/2015   Abnormal EKG 05/01/2015   Preoperative clearance 05/01/2015   Hx of peritonsillar abscess drainage 01/03/2015   DM (diabetes mellitus) (HCC) 09/10/2013   Hyperosmolar non-ketotic state in patient with type 2 diabetes mellitus (HCC) 09/10/2013   Nausea & vomiting 09/10/2013   Chest pain 09/10/2013   Bipolar disorder (HCC)    Severe bipolar I disorder, current or most recent episode depressed (HCC) 11/26/2011   Home Medication(s) Prior to Admission medications   Medication Sig Start Date End Date Taking? Authorizing Provider  Acetaminophen (TYLENOL PO) Take 2 tablets by mouth 3 (three) times daily as needed (tooth ache).    [provider]  albuterol (PROVENTIL HFA;VENTOLIN HFA) 108 (90 Base) MCG/ACT inhaler Inhale 1-2 puffs into the lungs every 6 (six) hours as needed for wheezing or shortness of breath. Patient taking differently: Inhale 2 puffs into the lungs as needed for shortness of breath.  03/04/19   Grayce Sessions, NP  amitriptyline (ELAVIL) 150 MG tablet Take 150 mg by mouth at bedtime. 08/06/22   [provider]  amLODipine (NORVASC) 10 MG tablet Take 1 tablet (10 mg total) by mouth daily. 09/23/22   Quintella Reichert, MD  aspirin 81 MG chewable tablet Chew 1 tablet (81 mg total) by mouth daily. 05/09/22   Cyndi Bender, NP  baclofen (LIORESAL) 10 MG tablet Take 10 mg by mouth 3 (three) times daily. 05/05/22   [provider]  busPIRone  (BUSPAR) 10 MG tablet Take 10 mg by mouth 3 (three) times daily. 01/25/19   [provider]  Cholecalciferol (VITAMIN D3) 1.25 MG (50000 UT) CAPS Take 1 capsule by mouth once a week. 08/08/22   [provider]  furosemide (LASIX) 20 MG tablet Take 2 tablets by mouth daily X's 3 days then reduce to 1 tablet by mouth daily. Patient taking differently: Take 20 mg by mouth daily. 05/29/22   Tereso Newcomer T, PA-C  gabapentin (NEURONTIN) 300 MG capsule Take 1 capsule (300 mg total) by mouth 3 (three) times daily. 06/29/19   Grayce Sessions, NP  LATUDA 80 MG TABS tablet Take 80 mg by mouth every evening. 02/01/19   [provider]  metoprolol tartrate (LOPRESSOR) 25 MG tablet Take 1 tablet (25 mg total) by mouth 2 (two) times daily. 09/23/22   Quintella Reichert, MD  nitroGLYCERIN (NITROSTAT) 0.4 MG SL tablet Place 1 tablet (0.4 mg total) under the tongue every 5 (five) minutes x 3 doses as needed for chest pain. 05/09/22   Cyndi Bender, NP  NOVOLOG FLEXPEN 100 UNIT/ML FlexPen Inject 10-15 Units into the skin 3 (three) times daily as needed for high blood sugar. Sliding Scale 08/12/22   [provider]  Oxycodone HCl 10 MG TABS Take 10 mg by mouth 4 (four) times daily as needed (pain). 07/08/22   [provider]  pantoprazole (PROTONIX) 40 MG tablet Take 1 tablet (40 mg total) by mouth daily. 09/25/22     potassium chloride SA (KLOR-CON M) 20 MEQ tablet Take one tablet twice a day for five days, then take  one tablet every day. 07/17/22   Dione Booze, MD  rosuvastatin (CRESTOR) 10 MG tablet Take 10 mg by mouth at bedtime. 08/10/22   [provider]  ticagrelor (BRILINTA) 90 MG TABS tablet Take 1 tablet (90 mg total) by mouth 2 (two) times daily. Patient not taking: Reported on 07/08/2022 05/09/22   Cyndi Bender, NP  topiramate (TOPAMAX) 200 MG tablet Take 200 mg by mouth at bedtime. 01/28/19   [provider]  traZODone (DESYREL) 100 MG tablet Take 200 mg by  mouth at bedtime. 02/24/19   [provider]  TRESIBA FLEXTOUCH 100 UNIT/ML FlexTouch Pen Inject 12 Units into the skin at bedtime. 07/12/22   [provider]  Allergies Lyrica [pregabalin], Rocephin [ceftriaxone sodium in dextrose], Sulfa antibiotics, Ultram [tramadol], Vancomycin, Glucotrol [glipizide], Keflex [cephalexin], Penicillins, and Janumet [sitagliptin-metformin hcl]  Review of Systems Review of Systems As noted in HPI  Physical Exam Vital Signs  I have reviewed the triage vital signs BP 113/83   Pulse 76   Temp 98.4 F (36.9 C) (Oral)   Resp 16   Ht  (1.575 m)   Wt 77.1 kg   SpO2 99%   BMI 31.09 kg/m   Physical Exam Constitutional:      General: She is not in acute distress.    Appearance: She is well-developed. She is not diaphoretic.  HENT:     Head: Normocephalic. Contusion present.      Right Ear: External ear normal.     Left Ear: External ear normal.     Nose: Nose normal.  Eyes:     General: No scleral icterus.       Right eye: No discharge.        Left eye: No discharge.     Conjunctiva/sclera: Conjunctivae normal.     Pupils: Pupils are equal, round, and reactive to light.  Cardiovascular:     Rate and Rhythm: Normal rate and regular rhythm.     Pulses:          Radial pulses are 2+ on the right side and 2+ on the left side.       Dorsalis pedis pulses are 2+ on the right side and 2+ on the left side.     Heart sounds: Normal heart sounds. No murmur heard.    No friction rub. No gallop.  Pulmonary:     Effort: Pulmonary effort is normal. No respiratory distress.     Breath sounds: Normal breath sounds. No stridor. No wheezing.  Abdominal:     General: There is no distension.     Palpations: Abdomen is soft.     Tenderness: There is no abdominal tenderness.  Musculoskeletal:     Cervical back:  Normal range of motion and neck supple. No bony tenderness. Muscular tenderness present. No spinous process tenderness.     Thoracic back: No bony tenderness.     Lumbar back: No bony tenderness.     Right hip: Tenderness present.     Left hip: No tenderness.     Comments: Clavicles stable. Chest stable to AP/Lat compression. Pelvis stable to Lat compression. No obvious extremity deformity. No chest or abdominal wall contusion.  Skin:    General: Skin is warm and dry.     Findings: No erythema or rash.  Neurological:     Mental Status: She is alert and oriented to person, place, and time.     Comments: Moving all extremities     ED Results and Treatments Labs (all labs ordered are listed, but only abnormal results are displayed) Labs Reviewed  BETA-HYDROXYBUTYRIC ACID - Abnormal; Notable for the following components:      Result Value   Beta-Hydroxybutyric Acid 0.58 (*)    All other components within normal limits  CBC WITH DIFFERENTIAL/PLATELET - Abnormal; Notable for the following components:   RBC 5.56 (*)    Hemoglobin 15.1 (*)    MCV 78.2 (*)    All other components within normal limits  COMPREHENSIVE METABOLIC PANEL - Abnormal; Notable for the following components:   Sodium 127 (*)    Potassium 2.8 (*)    Chloride 87 (*)    Glucose, Bld 423 (*)  BUN 32 (*)    Creatinine, Ser 1.33 (*)    Alkaline Phosphatase 166 (*)    GFR, Estimated 49 (*)    All other components within normal limits  CBG MONITORING, ED - Abnormal; Notable for the following components:   Glucose-Capillary 445 (*)    All other components within normal limits  CBG MONITORING, ED - Abnormal; Notable for the following components:   Glucose-Capillary 351 (*)    All other components within normal limits  I-STAT VENOUS BLOOD GAS, ED - Abnormal; Notable for the following components:   pO2, Ven 48 (*)    Bicarbonate 29.5 (*)    Acid-Base Excess 3.0 (*)    Sodium 129 (*)    Potassium 2.9 (*)    HCT  49.0 (*)    Hemoglobin 16.7 (*)    All other components within normal limits  I-STAT CHEM 8, ED - Abnormal; Notable for the following components:   Sodium 131 (*)    Potassium 2.9 (*)    Chloride 91 (*)    BUN 36 (*)    Creatinine, Ser 1.30 (*)    Glucose, Bld 417 (*)    Hemoglobin 17.3 (*)    HCT 51.0 (*)    All other components within normal limits  CBG MONITORING, ED - Abnormal; Notable for the following components:   Glucose-Capillary 267 (*)    All other components within normal limits  ETHANOL  LACTIC ACID, PLASMA  MAGNESIUM  URINALYSIS, ROUTINE W REFLEX MICROSCOPIC  URINALYSIS, COMPLETE (UACMP) WITH MICROSCOPIC  RAPID URINE DRUG SCREEN, HOSP PERFORMED  BASIC METABOLIC PANEL  I-STAT BETA HCG BLOOD, ED ([facility], [facility], [facility] ONLY)                                                                                                                         EKG  EKG Interpretation  Date/Time:  Sunday October 06 2022 01:13:15 EDT Ventricular Rate:  78 PR Interval:  159 QRS Duration: 101 QT Interval:  472 QTC Calculation: 538 R Axis:   -48 Text Interpretation: Sinus rhythm Right atrial enlargement Left anterior fascicular block Abnormal R-wave progression, late transition Borderline T abnormalities, lateral leads Prolonged QT interval Confirmed by Drema Pryardama, Miliano Cotten [phone]([id number]) on 10/06/2022 1:26:42 AM        Radiology CT Cervical Spine Wo Contrast  Result Date: 10/06/2022 CLINICAL DATA:  Status post trauma. EXAM: CT CERVICAL SPINE WITHOUT CONTRAST TECHNIQUE: Multidetector CT imaging of the cervical spine was performed without intravenous contrast. Multiplanar CT image reconstructions were also generated. RADIATION DOSE REDUCTION: This exam was performed according to the departmental dose-optimization program which includes automated exposure control, adjustment of the mA and/or kV according to patient size and/or use of iterative reconstruction technique. COMPARISON:  None Available. FINDINGS:  Alignment: Normal. Skull base and vertebrae: No acute fracture. A benign 5 mm bone island is seen within the body of C2 on the left. Soft tissues and spinal canal: No prevertebral fluid or swelling. No visible  canal hematoma. Disc levels: Normal multilevel endplates are seen with normal multilevel intervertebral disc spaces. Normal, bilateral multilevel facet joints are noted. Upper chest: Negative. Other: None. IMPRESSION: No acute fracture or subluxation in the cervical spine. Electronically Signed   By: Virgina Norfolk M.D.   On: 10/06/2022 01:54   DG Chest Portable 1 View  Result Date: 10/06/2022 CLINICAL DATA:  Fall injury.  Evaluate chest, pelvis. EXAM: PORTABLE CHEST 1 VIEW COMPARISON:  CTA chest, abdomen and pelvis and reconstructions from 08/27/22. FINDINGS: Chest: The cardiac size is upper-normal. No vascular congestion is seen. There is a normal mediastinal configuration. A stent is again noted in the circumflex coronary artery. The lungs are clear. No pleural effusion is seen. Regional osseous structures are unremarkable. Multiple overlying monitor wires. Pelvis: The right inferior pubic ramus is obscured by overlying wiring. Elsewhere, no pelvic fracture or diastasis is seen. A 1 cm bone island is again noted in the right pelvic wing. Joint spaces are maintained at the hips, SI joints and symphysis pubis apart from mild chronic joint space loss of the superior right hip. There are numerous gonadal vein phleboliths and pelvic phleboliths. Otherwise unremarkable soft tissues. IMPRESSION: 1. No acute radiographic chest findings or interval changes. 2. No AP evidence of pelvic fracture or diastasis, with the right inferior pubic ramus obscured by overlying wiring. Electronically Signed   By: Telford Nab M.D.   On: 10/06/2022 01:54   DG Pelvis Portable  Result Date: 10/06/2022 CLINICAL DATA:  Fall injury.  Evaluate chest, pelvis. EXAM: PORTABLE CHEST 1 VIEW COMPARISON:  CTA chest, abdomen and  pelvis and reconstructions from 08/27/22. FINDINGS: Chest: The cardiac size is upper-normal. No vascular congestion is seen. There is a normal mediastinal configuration. A stent is again noted in the circumflex coronary artery. The lungs are clear. No pleural effusion is seen. Regional osseous structures are unremarkable. Multiple overlying monitor wires. Pelvis: The right inferior pubic ramus is obscured by overlying wiring. Elsewhere, no pelvic fracture or diastasis is seen. A 1 cm bone island is again noted in the right pelvic wing. Joint spaces are maintained at the hips, SI joints and symphysis pubis apart from mild chronic joint space loss of the superior right hip. There are numerous gonadal vein phleboliths and pelvic phleboliths. Otherwise unremarkable soft tissues. IMPRESSION: 1. No acute radiographic chest findings or interval changes. 2. No AP evidence of pelvic fracture or diastasis, with the right inferior pubic ramus obscured by overlying wiring. Electronically Signed   By: Telford Nab M.D.   On: 10/06/2022 01:54   CT Head Wo Contrast  Result Date: 10/06/2022 CLINICAL DATA:  Status post trauma. EXAM: CT HEAD WITHOUT CONTRAST TECHNIQUE: Contiguous axial images were obtained from the base of the skull through the vertex without intravenous contrast. RADIATION DOSE REDUCTION: This exam was performed according to the departmental dose-optimization program which includes automated exposure control, adjustment of the mA and/or kV according to patient size and/or use of iterative reconstruction technique. COMPARISON:  None Available. FINDINGS: Brain: No evidence of acute infarction, hemorrhage, hydrocephalus, extra-axial collection or mass lesion/mass effect. Vascular: No hyperdense vessel or unexpected calcification. Skull: Normal. Negative for fracture or focal lesion. Sinuses/Orbits: No acute finding. Other: There is mild right frontal scalp soft tissue swelling. IMPRESSION: 1. No acute  intracranial process. 2. Mild right frontal scalp soft tissue swelling. Electronically Signed   By: Virgina Norfolk M.D.   On: 10/06/2022 01:51    Medications Ordered in ED Medications  lactated ringers infusion (  Intravenous New Bag/Given 10/06/22 0610)  dextrose 5 % in lactated ringers infusion (has no administration in time range)  dextrose 50 % solution 0-50 mL (has no administration in time range)  potassium chloride 10 mEq in 100 mL IVPB (has no administration in time range)  lactated ringers bolus 1,542 mL (0 mLs Intravenous Stopped 10/06/22 0354)  magnesium sulfate IVPB 2 g 50 mL (2 g Intravenous New Bag/Given 10/06/22 0542)                                                                                                                                     Procedures .1-3 Lead EKG Interpretation  Performed by: Nira Conn, MD Authorized by: Nira Conn, MD     Interpretation: normal     ECG rate:  88   ECG rate assessment: normal     Rhythm: sinus rhythm     Ectopy: none     Conduction: normal     (including critical care time)  Medical Decision Making / ED Course   Medical Decision Making Amount and/or Complexity of Data Reviewed External Data Reviewed: notes. Labs: ordered. Decision-making details documented in ED Course. Radiology: ordered and independent interpretation performed. Decision-making details documented in ED Course. ECG/medicine tests: ordered and independent interpretation performed. Decision-making details documented in ED Course.  Risk Prescription drug management. Drug therapy requiring intensive monitoring for toxicity. Decision regarding hospitalization.    Unable to determine whether mechanical fall or syncope. We will need to assess for injuries including intracranial and cervical injuries.  We will get a plain film of the right hip as well. We will assess for evidence of DKA, AKI, dysrhythmias, blocks, electrolyte or  metabolic derangements.  We will also assess for infection or intoxication.  Initial EKG without acute ischemic changes.  QTc was prolonged. CBG greater than 400  Patient was started on IV fluids and insulin drip.  CBC without leukocytosis or anemia CMP with with hyponatremia (likely pseudohyponatremia due to hyperglycemia), hypokalemia.  Renal sufficiency with mild AKI.  Hyperglycemia without evidence of DKA.  Potassium and magnesium repleted. Checking mag  CT head and cervical spine without acute injuries. Plain film chest without pneumonia, pneumothorax, pulmonary edema.  Plain film of the hip without acute fracture.  On review of records, patient was seen several months ago for similar presentation of somnolence.  After patient came to that time she reported that she had taken trazodone and amitriptyline to help her sleep.   After IV fluids and initial treatment.  Patient was more alert and admitted to taking 3 tablets to help her sleep tonight including trazodone and amitriptyline.  Patient's QTc prolongation may be related to this medication.  Repeat EKG after initial management was notable for improved and resolved QT prolongation. CBG is below 300. Insulin DC'd.  Currently pending UA, repeat BMP, and Mg.  Patient care turned over to oncoming provider. Patient case and  results discussed in detail; please see their note for further ED managment.          Final Clinical Impression(s) / ED Diagnoses Final diagnoses:  None           This chart was dictated using voice recognition software.  Despite best efforts to proofread,  errors can occur which can change the documentation meaning.    Nira Conn, MD 10/06/22 [phone]

## 2022-10-06 NOTE — ED Notes (Signed)
RN at bedside, pt is lethargic. Pt woke up for her temp and fell right back asleep, snoring. Chest rise and fall noted. No apparent distress at this time. MD notified about lethargy.

## 2022-10-06 NOTE — Discharge Instructions (Addendum)
You were seen in the emergency department after your fall.  This was likely due to the multiple sleeping medications that you took last night.  You should follow-up with your primary doctor to have your medications reviewed to see if you need to make any changes to prevent you from having falls at night in the future.  You were also found to have a UTI and this is treated with antibiotics.  Please complete this course as prescribed.  Your potassium level was low and we repleted this for you in the ER and your blood sugars were mildly high.  You should have your potassium and blood sugar rechecked by your primary doctor.  Your imaging showed no signs of broken bones or injury to your brain from your fall.  You should return to the emergency department if you are having fevers, repetitive vomiting, severe back pain, you pass out or if you have any other new or concerning symptoms.

## 2022-10-08 ENCOUNTER — Other Ambulatory Visit (HOSPITAL_COMMUNITY): Payer: Self-pay

## 2022-10-08 MED ORDER — GABAPENTIN 300 MG PO CAPS
300.0000 mg | ORAL_CAPSULE | Freq: Three times a day (TID) | ORAL | 1 refills | Status: DC
  Filled 2022-10-08: qty 90, 30d supply, fill #0

## 2022-10-08 MED ORDER — OXYCODONE HCL 10 MG PO TABS
10.0000 mg | ORAL_TABLET | Freq: Three times a day (TID) | ORAL | 0 refills | Status: DC | PRN
  Filled 2022-10-08 – 2022-10-11 (×3): qty 90, 30d supply, fill #0

## 2022-10-08 MED ORDER — DIALYVITE VITAMIN D3 MAX 1.25 MG (50000 UT) PO TABS: 1.0000 | ORAL_TABLET | ORAL | 3 refills | Status: DC

## 2022-10-11 ENCOUNTER — Other Ambulatory Visit (HOSPITAL_COMMUNITY): Payer: Self-pay

## 2022-10-18 ENCOUNTER — Other Ambulatory Visit (HOSPITAL_COMMUNITY): Payer: Self-pay

## 2022-10-18 MED ORDER — AZITHROMYCIN 250 MG PO TABS
ORAL_TABLET | ORAL | 0 refills | Status: DC
  Filled 2022-10-18: qty 6, 5d supply, fill #0

## 2022-10-30 ENCOUNTER — Other Ambulatory Visit (HOSPITAL_COMMUNITY): Payer: Self-pay

## 2022-10-30 MED ORDER — PANTOPRAZOLE SODIUM 40 MG PO TBEC
40.0000 mg | DELAYED_RELEASE_TABLET | Freq: Every day | ORAL | 0 refills | Status: DC
  Filled 2022-10-30: qty 30, 30d supply, fill #0

## 2022-11-01 ENCOUNTER — Encounter (HOSPITAL_COMMUNITY): Payer: Self-pay | Admitting: Emergency Medicine

## 2022-11-01 ENCOUNTER — Ambulatory Visit (HOSPITAL_COMMUNITY)
Admission: EM | Admit: 2022-11-01 | Discharge: 2022-11-01 | Disposition: A | Discharge status: Home / Self Care | Payer: Medicare HMO | Attending: Emergency Medicine | Admitting: Emergency Medicine

## 2022-11-01 DIAGNOSIS — S6991XA Unspecified injury of right wrist, hand and finger(s), initial encounter: Secondary | ICD-10-CM | POA: Diagnosis not present

## 2022-11-01 NOTE — ED Provider Notes (Signed)
[facility]    CSN: [id number] Arrival date & time: 11/01/22  0801      History   Chief Complaint Chief Complaint  Patient presents with   Hand Injury    HPI Stephanie Thornton is a 51 y.o. female.  Patient complaining of right hand pain that started 2 weeks ago.  Patient states that onset of symptoms began after falling and using hand to break fall.  Patient denies any loss of consciousness or hitting her head.  Patient states pain has progressively worsened in her right index finger.  Patient states the finger is throbbing and she is having difficulty moving the RT index finger.  Patient states an x-ray was performed in October and no fracture was seen in her hand. Patient has not taken any medications for symptoms.  Patient denies any previous injury or surgery on right hand.    Hand Injury   Past Medical History:  Diagnosis Date   Anxiety    Arthritis    "knees, feet, back" (05/24/2015)   Benign essential HTN 05/01/2015   Bipolar disorder ([place]) diagnosed early 90s   CAD in native artery    Chronic bronchitis ([place])    "get it q yr"   Chronic lower back pain    Depression    Diabetes mellitus type II 2010   GERD (gastroesophageal reflux disease)    Headache    "maybe 3 times/wk" (05/24/2015)   History of hiatal hernia    History of stomach ulcers    HLD (hyperlipidemia)    Menopause    Migraine    "1-2/wk" (05/24/2015)   NSTEMI (non-ST elevated myocardial infarction) ([place]) 04/2022   OSA on CPAP     Patient Active Problem List   Diagnosis Date Noted   Uncontrolled type 2 diabetes mellitus with hyperglycemia ([place]) 08/27/2022   Splenic lesion 08/27/2022   DKA (diabetic ketoacidosis) ([place]) 07/10/2022   Diabetic ketoacidosis without coma associated with type 2 diabetes mellitus ([place]) 07/07/2022   Acute pancreatitis without infection or necrosis 07/07/2022   AKI (acute kidney injury) ([place]) 07/07/2022   Hypokalemia 07/07/2022   Hypercalcemia 07/07/2022    Coronary artery disease 07/07/2022   Thyroid nodule 07/07/2022   Leukocytosis 07/07/2022   OSA (obstructive sleep apnea)    Reflux esophagitis    Weakness 05/22/2022   Shortness of breath 05/22/2022   Mixed hyperlipidemia 05/21/2022   NSTEMI (non-ST elevated myocardial infarction) ([place]) 05/07/2022   Essential hypertension    Depression    Diabetes type 2, controlled ([place])    Chronic tonsillitis 05/24/2015   Abnormal EKG 05/01/2015   Preoperative clearance 05/01/2015   Hx of peritonsillar abscess drainage 01/03/2015   DM (diabetes mellitus) ([place]) 09/10/2013   Hyperosmolar non-ketotic state in patient with type 2 diabetes mellitus ([place]) 09/10/2013   Nausea & vomiting 09/10/2013   Chest pain 09/10/2013   Bipolar disorder ([place])    Severe bipolar I disorder, current or most recent episode depressed ([place]) 11/26/2011    Past Surgical History:  Procedure Laterality Date   ABDOMINAL HYSTERECTOMY  2007   BUNIONECTOMY Bilateral    CARPAL TUNNEL RELEASE Right    CORONARY STENT INTERVENTION N/A 05/07/2022   Procedure: CORONARY STENT INTERVENTION;  Surgeon: Belva Crome, MD;  Location: [facility];  Service: Cardiovascular;  Laterality: N/A;   HAMMER TOE SURGERY Left    LEFT HEART CATH AND CORONARY ANGIOGRAPHY N/A 05/20/2018   Procedure: LEFT HEART CATH AND CORONARY ANGIOGRAPHY;  Surgeon: Leonie Man,  MD;  Location: [facility];  Service: Cardiovascular;  Laterality: N/A;   LEFT HEART CATH AND CORONARY ANGIOGRAPHY N/A 05/07/2022   Procedure: LEFT HEART CATH AND CORONARY ANGIOGRAPHY;  Surgeon: Lyn Records, MD;  Location: [facility];  Service: Cardiovascular;  Laterality: N/A;   TONSILLECTOMY Bilateral 05/24/2015   Procedure: TONSILLECTOMY;  Surgeon: Christia Reading, MD;  Location: [facility] OR;  Service: ENT;  Laterality: Bilateral;    OB History   No obstetric history on file.      Home Medications    Prior to Admission medications   Medication Sig Start Date End  Date Taking? Authorizing Provider  Acetaminophen (TYLENOL PO) Take 2 tablets by mouth 3 (three) times daily as needed (tooth ache).    [provider]  albuterol (PROVENTIL HFA;VENTOLIN HFA) 108 (90 Base) MCG/ACT inhaler Inhale 1-2 puffs into the lungs every 6 (six) hours as needed for wheezing or shortness of breath. Patient taking differently: Inhale 2 puffs into the lungs as needed for shortness of breath. 03/04/19   Grayce Sessions, NP  amitriptyline (ELAVIL) 150 MG tablet Take 150 mg by mouth at bedtime. 08/06/22   [provider]  amLODipine (NORVASC) 10 MG tablet Take 1 tablet (10 mg total) by mouth daily. 09/23/22   Quintella Reichert, MD  aspirin 81 MG chewable tablet Chew 1 tablet (81 mg total) by mouth daily. 05/09/22   Cyndi Bender, NP  azithromycin (ZITHROMAX Z-PAK) 250 MG tablet Take 2 tablets on day 1, then take 1 tablet daily for 4 days 10/18/22     baclofen (LIORESAL) 10 MG tablet Take 10 mg by mouth 3 (three) times daily. 05/05/22   [provider]  busPIRone (BUSPAR) 10 MG tablet Take 10 mg by mouth 3 (three) times daily. 01/25/19   [provider]  Cholecalciferol (DIALYVITE VITAMIN D3 MAX) 1.25 MG (50000 UT) TABS Take 1 tablet by mouth once a week. 10/08/22     Cholecalciferol (VITAMIN D3) 1.25 MG (50000 UT) CAPS Take 1 capsule by mouth once a week. 08/08/22   [provider]  furosemide (LASIX) 20 MG tablet Take 2 tablets by mouth daily X's 3 days then reduce to 1 tablet by mouth daily. Patient taking differently: Take 20 mg by mouth daily. 05/29/22   Tereso Newcomer T, PA-C  gabapentin (NEURONTIN) 300 MG capsule Take 1 capsule (300 mg total) by mouth 3 (three) times daily. 06/29/19   Grayce Sessions, NP  gabapentin (NEURONTIN) 300 MG capsule Take 1 capsule (300 mg total) by mouth 3 (three) times daily. 10/08/22     LATUDA 80 MG TABS tablet Take 80 mg by mouth every evening. 02/01/19   [provider]  metoprolol tartrate (LOPRESSOR) 25  MG tablet Take 1 tablet (25 mg total) by mouth 2 (two) times daily. 09/23/22   Quintella Reichert, MD  nitrofurantoin, macrocrystal-monohydrate, (MACROBID) 100 MG capsule Take 1 capsule (100 mg total) by mouth 2 (two) times daily. 10/06/22   Elayne Snare K, DO  nitroGLYCERIN (NITROSTAT) 0.4 MG SL tablet Place 1 tablet (0.4 mg total) under the tongue every 5 (five) minutes x 3 doses as needed for chest pain. 05/09/22   Cyndi Bender, NP  NOVOLOG FLEXPEN 100 UNIT/ML FlexPen Inject 10-15 Units into the skin 3 (three) times daily as needed for high blood sugar. Sliding Scale 08/12/22   [provider]  Oxycodone HCl 10 MG TABS Take 10 mg by mouth 4 (four) times daily as needed (pain).  07/08/22   [provider]  Oxycodone HCl 10 MG TABS Take 1 tablet (10 mg total) by mouth 3 (three) times daily as needed. 10/08/22     pantoprazole (PROTONIX) 40 MG tablet Take 1 tablet (40 mg total) by mouth daily. 10/30/22     potassium chloride SA (KLOR-CON M) 20 MEQ tablet Take one tablet twice a day for five days, then take  one tablet every day. 07/17/22   Dione Booze, MD  rosuvastatin (CRESTOR) 10 MG tablet Take 10 mg by mouth at bedtime. 08/10/22   [provider]  ticagrelor (BRILINTA) 90 MG TABS tablet Take 1 tablet (90 mg total) by mouth 2 (two) times daily. Patient not taking: Reported on 07/08/2022 05/09/22   Cyndi Bender, NP  topiramate (TOPAMAX) 200 MG tablet Take 200 mg by mouth at bedtime. 01/28/19   [provider]  traZODone (DESYREL) 100 MG tablet Take 200 mg by mouth at bedtime. 02/24/19   [provider]  TRESIBA FLEXTOUCH 100 UNIT/ML FlexTouch Pen Inject 12 Units into the skin at bedtime. 07/12/22   [provider]    Family History Family History  Problem Relation Age of Onset   Bipolar disorder Mother    CVA Mother    Bipolar disorder Son    Depression Brother        schizoaffective d/o   Hypertension Sister    Heart disease Maternal Aunt    Heart  attack Maternal Aunt    Hypertension Brother     Social History Social History   Tobacco Use   Smoking status: Former    Packs/day: 1.00    Years: 30.00    Total pack years: 30.00    Types: Cigarettes    Quit date: 09/22/2022    Years since quitting: 0.1   Smokeless tobacco: Never  Vaping Use   Vaping Use: Never used  Substance Use Topics   Alcohol use: No   Drug use: No     Allergies   Lyrica [pregabalin], Rocephin [ceftriaxone sodium in dextrose], Sulfa antibiotics, Ultram [tramadol], Vancomycin, Glucotrol [glipizide], Keflex [cephalexin], Penicillins, and Janumet [sitagliptin-metformin hcl]   Review of Systems Review of Systems  Musculoskeletal:  Positive for joint swelling.       Right index finger swelling and pain. Patient endorses difficulty moving finger.   Neurological:  Negative for numbness.       Denies numbness in RT hand.      Physical Exam Triage Vital Signs ED Triage Vitals [11/01/22 0816]  Enc Vitals Group     BP (!) 141/88     Pulse Rate (!) 104     Resp 17     Temp 98.5 F (36.9 C)     Temp Source Oral     SpO2 97 %     Weight      Height      Head Circumference      Peak Flow      Pain Score      Pain Loc      Pain Edu?      Excl. in GC?    No data found.  Updated Vital Signs BP (!) 141/88 (BP Location: Left Arm)   Pulse (!) 104   Temp 98.5 F (36.9 C) (Oral)   Resp 17   SpO2 97%      Physical Exam Vitals and nursing note reviewed.  Constitutional:      Appearance: Normal appearance.  Cardiovascular:     Pulses:  Radial pulses are 2+ on the right side and 2+ on the left side.  Musculoskeletal:     Right wrist: Normal.     Left wrist: Normal.     Right hand: Swelling, tenderness and bony tenderness present. Decreased range of motion. Decreased strength of finger abduction. Normal capillary refill. Normal pulse.     Left hand: Normal.     Comments: Swelling to the base of RT index finger.  Inability to make a  fist. Tenderness upon palpation of RT index finger from metacarpophalangeal joint to base of finger. Tenderness upon palpation of base of RT thumb.   Neurological:     Mental Status: She is alert.      UC Treatments / Results  Labs (all labs ordered are listed, but only abnormal results are displayed) Labs Reviewed - No data to display  EKG   Radiology No results found.  Procedures Procedures (including critical care time)  Medications Ordered in UC Medications - No data to display  Initial Impression / Assessment and Plan / UC Course  I have reviewed the triage vital signs and the nursing notes.  Pertinent labs & imaging results that were available during my care of the patient were reviewed by me and considered in my medical decision making (see chart for details).     Patient was seen due to right hand injury.  Patient was given information for a hand specialist.  Due to ongoing symptoms and having a previous x-ray seen in epic on 10/06/2022 with no bone abnormality found with only mild radiocarpal joint space narrowing seen on x-ray.  Patient was made aware that she should go ahead and set an appointment with the hand specialist today.  Finger splint was placed on right index finger for comfort and stability.  Patient verbalized understanding of instructions.  Final Clinical Impressions(s) / UC Diagnoses   Final diagnoses:  Injury of right hand, initial encounter     Discharge Instructions      I have attached information for hand specialist.  Please call and schedule an appointment with this provider.  We have applied a splint on your finger in office.      ED Prescriptions   None    PDMP not reviewed this encounter.   Debby Freiberg, NP 11/01/22 [id number]

## 2022-11-01 NOTE — Discharge Instructions (Signed)
I have attached information for hand specialist.  Please call and schedule an appointment with this provider.  We have applied a splint on your finger in office.

## 2022-11-01 NOTE — ED Triage Notes (Signed)
Pt fell two weeks ago and caught herself with right hand.reports pain in index and thumb.

## 2022-11-02 ENCOUNTER — Other Ambulatory Visit (HOSPITAL_COMMUNITY): Payer: Self-pay

## 2022-11-11 ENCOUNTER — Other Ambulatory Visit (HOSPITAL_COMMUNITY): Payer: Self-pay

## 2022-11-11 MED ORDER — GABAPENTIN 300 MG PO CAPS
300.0000 mg | ORAL_CAPSULE | Freq: Three times a day (TID) | ORAL | 1 refills | Status: DC
  Filled 2022-11-11: qty 90, 30d supply, fill #0

## 2022-11-11 MED ORDER — CHOLECALCIFEROL 1.25 MG (50000 UT) PO CAPS
50000.0000 [IU] | ORAL_CAPSULE | ORAL | 3 refills | Status: DC
  Filled 2022-11-11: qty 4, 28d supply, fill #0

## 2022-11-11 MED ORDER — OXYCODONE HCL 10 MG PO TABS
10.0000 mg | ORAL_TABLET | Freq: Three times a day (TID) | ORAL | 0 refills | Status: DC | PRN
  Filled 2022-11-11: qty 90, 30d supply, fill #0

## 2022-11-12 ENCOUNTER — Other Ambulatory Visit (HOSPITAL_COMMUNITY): Payer: Self-pay

## 2022-11-12 ENCOUNTER — Encounter: Payer: Self-pay | Admitting: Internal Medicine

## 2022-11-19 ENCOUNTER — Other Ambulatory Visit (HOSPITAL_COMMUNITY): Payer: Self-pay

## 2022-12-13 ENCOUNTER — Other Ambulatory Visit (HOSPITAL_COMMUNITY): Payer: Self-pay

## 2022-12-18 ENCOUNTER — Other Ambulatory Visit (HOSPITAL_COMMUNITY): Payer: Self-pay

## 2022-12-19 ENCOUNTER — Other Ambulatory Visit (HOSPITAL_COMMUNITY): Payer: Self-pay

## 2023-04-30 ENCOUNTER — Other Ambulatory Visit: Payer: Self-pay

## 2023-04-30 ENCOUNTER — Encounter (HOSPITAL_COMMUNITY): Payer: Self-pay

## 2023-04-30 ENCOUNTER — Emergency Department (HOSPITAL_COMMUNITY): Payer: Medicare HMO

## 2023-04-30 ENCOUNTER — Emergency Department (HOSPITAL_COMMUNITY)
Admission: EM | Admit: 2023-04-30 | Discharge: 2023-04-30 | Disposition: A | Discharge status: Home / Self Care | Payer: Medicare HMO | Attending: Emergency Medicine | Admitting: Emergency Medicine

## 2023-04-30 DIAGNOSIS — I2510 Atherosclerotic heart disease of native coronary artery without angina pectoris: Secondary | ICD-10-CM | POA: Diagnosis not present

## 2023-04-30 DIAGNOSIS — K2900 Acute gastritis without bleeding: Secondary | ICD-10-CM | POA: Insufficient documentation

## 2023-04-30 DIAGNOSIS — E876 Hypokalemia: Secondary | ICD-10-CM | POA: Insufficient documentation

## 2023-04-30 DIAGNOSIS — I10 Essential (primary) hypertension: Secondary | ICD-10-CM | POA: Insufficient documentation

## 2023-04-30 DIAGNOSIS — Z7982 Long term (current) use of aspirin: Secondary | ICD-10-CM | POA: Insufficient documentation

## 2023-04-30 DIAGNOSIS — Z79899 Other long term (current) drug therapy: Secondary | ICD-10-CM | POA: Insufficient documentation

## 2023-04-30 DIAGNOSIS — Z794 Long term (current) use of insulin: Secondary | ICD-10-CM | POA: Diagnosis not present

## 2023-04-30 DIAGNOSIS — R079 Chest pain, unspecified: Secondary | ICD-10-CM | POA: Diagnosis present

## 2023-04-30 HISTORY — DX: Cerebral infarction, unspecified: I63.9

## 2023-04-30 LAB — CBC WITH DIFFERENTIAL/PLATELET
Abs Immature Granulocytes: 0.04 10*3/uL (ref 0.00–0.07)
Basophils Absolute: 0 10*3/uL (ref 0.0–0.1)
Basophils Relative: 0 %
Eosinophils Absolute: 0.2 10*3/uL (ref 0.0–0.5)
Eosinophils Relative: 2 %
HCT: 48.4 % — ABNORMAL HIGH (ref 36.0–46.0)
Hemoglobin: 15.4 g/dL — ABNORMAL HIGH (ref 12.0–15.0)
Immature Granulocytes: 0 %
Lymphocytes Relative: 34 %
Lymphs Abs: 4 10*3/uL (ref 0.7–4.0)
MCH: 26.2 pg (ref 26.0–34.0)
MCHC: 31.8 g/dL (ref 30.0–36.0)
MCV: 82.3 fL (ref 80.0–100.0)
Monocytes Absolute: 0.8 10*3/uL (ref 0.1–1.0)
Monocytes Relative: 7 %
Neutro Abs: 6.6 10*3/uL (ref 1.7–7.7)
Neutrophils Relative %: 57 %
Platelets: 248 10*3/uL (ref 150–400)
RBC: 5.88 MIL/uL — ABNORMAL HIGH (ref 3.87–5.11)
RDW: 16.7 % — ABNORMAL HIGH (ref 11.5–15.5)
WBC: 11.6 10*3/uL — ABNORMAL HIGH (ref 4.0–10.5)
nRBC: 0 % (ref 0.0–0.2)

## 2023-04-30 LAB — COMPREHENSIVE METABOLIC PANEL
ALT: 14 U/L (ref 0–44)
AST: 12 U/L — ABNORMAL LOW (ref 15–41)
Albumin: 4 g/dL (ref 3.5–5.0)
Alkaline Phosphatase: 128 U/L — ABNORMAL HIGH (ref 38–126)
Anion gap: 10 (ref 5–15)
BUN: 17 mg/dL (ref 6–20)
CO2: 23 mmol/L (ref 22–32)
Calcium: 10.2 mg/dL (ref 8.9–10.3)
Chloride: 105 mmol/L (ref 98–111)
Creatinine, Ser: 1.3 mg/dL — ABNORMAL HIGH (ref 0.44–1.00)
GFR, Estimated: 50 mL/min — ABNORMAL LOW (ref >60)
Glucose, Bld: 103 mg/dL — ABNORMAL HIGH (ref 70–99)
Potassium: 3.3 mmol/L — ABNORMAL LOW (ref 3.5–5.1)
Sodium: 138 mmol/L (ref 135–145)
Total Bilirubin: 0.5 mg/dL (ref 0.3–1.2)
Total Protein: 8.1 g/dL (ref 6.5–8.1)

## 2023-04-30 LAB — LIPASE, BLOOD: Lipase: 24 U/L (ref 11–51)

## 2023-04-30 LAB — CBG MONITORING, ED: Glucose-Capillary: 119 mg/dL — ABNORMAL HIGH (ref 70–99)

## 2023-04-30 LAB — TROPONIN I (HIGH SENSITIVITY): Troponin I (High Sensitivity): 9 ng/L (ref <18)

## 2023-04-30 MED ORDER — POTASSIUM CHLORIDE 20 MEQ PO PACK
40.0000 meq | PACK | Freq: Once | ORAL | Status: AC
  Administered 2023-04-30: 40 meq via ORAL
  Filled 2023-04-30: qty 2

## 2023-04-30 MED ORDER — POTASSIUM CHLORIDE 10 MEQ PO PACK: 10.0000 meq | PACK | Freq: Every day | ORAL | 0 refills | Status: DC

## 2023-04-30 MED ORDER — ALUM & MAG HYDROXIDE-SIMETH 200-200-20 MG/5ML PO SUSP
30.0000 mL | Freq: Once | ORAL | Status: AC
  Administered 2023-04-30: 30 mL via ORAL
  Filled 2023-04-30: qty 30

## 2023-04-30 MED ORDER — FAMOTIDINE 20 MG PO TABS
20.0000 mg | ORAL_TABLET | Freq: Once | ORAL | Status: AC
  Administered 2023-04-30: 20 mg via ORAL
  Filled 2023-04-30: qty 1

## 2023-04-30 MED ORDER — POTASSIUM CHLORIDE CRYS ER 20 MEQ PO TBCR
40.0000 meq | EXTENDED_RELEASE_TABLET | Freq: Once | ORAL | Status: DC
  Filled 2023-04-30: qty 2

## 2023-04-30 MED ORDER — FAMOTIDINE 20 MG PO TABS: 20.0000 mg | ORAL_TABLET | Freq: Two times a day (BID) | ORAL | 0 refills | Status: DC

## 2023-04-30 MED ORDER — MAALOX MAX 400-400-40 MG/5ML PO SUSP: 15.0000 mL | Freq: Four times a day (QID) | ORAL | 0 refills | Status: DC | PRN

## 2023-04-30 NOTE — ED Triage Notes (Signed)
Pt to er, pt states that she is having some chest pain, states that sometimes it is on the R and sometimes on the L, states that she is having some nausea and vomiting, states that she feels like she has really bad indigestion.  Pt states that she has been having this for the past three days, but today is the worst.

## 2023-04-30 NOTE — Discharge Instructions (Signed)
You were seen in the emergency department for your chest and abdominal pain.  Your workup showed no signs of heart attack and you likely have gastritis or acid reflux.  Your potassium level was mildly low and we gave you potassium repletion in the ER.  You should continue to take your pantoprazole and you can take Pepcid for the next 2 weeks.  You can also take Maalox as needed.  You should follow-up with your primary doctor and your GI doctor to have your symptoms rechecked.  You should return to the emergency department for significantly worsening pain, repetitive vomiting, severe shortness of breath or if you have any other new or concerning symptoms.

## 2023-04-30 NOTE — ED Provider Notes (Signed)
[facility] AT [facility] Provider Note   CSN: [id number] Arrival date & time: 04/30/23  1635     History  Chief Complaint  Patient presents with   Chest Pain    Stephanie Thornton is a 52 y.o. female.  Patient is a 52 year old female with a past medical history of CAD status post stent placement, hypertension, GERD, and prior esophageal strictures presenting to the emergency department with chest pain.  The patient states for the last 3 days she has had pain in her chest.  She states that the pain is worse when she tries to swallow or eat and feels like a stabbing type of pain.  She states that she has had associated nausea and vomiting.  She states that she felt feverish yesterday but did not have a measured temperature at home.  She states that she has been constipated and feeling bloated and like she is unable to belch.  She states that she feels like she might need to have her esophagus stretched again.  She states this was last done about a year ago.  The history is provided by the patient.  Chest Pain      Home Medications Prior to Admission medications   Medication Sig Start Date End Date Taking? Authorizing Provider  alum & mag hydroxide-simeth (MAALOX MAX) 400-400-40 MG/5ML suspension Take 15 mLs by mouth every 6 (six) hours as needed for indigestion. 04/30/23  Yes Theresia Lo, Benetta Spar K, DO  famotidine (PEPCID) 20 MG tablet Take 1 tablet (20 mg total) by mouth 2 (two) times daily. 04/30/23  Yes Elayne Snare K, DO  Potassium Chloride 10 MEQ PACK Take 10 mEq by mouth daily for 7 days. 04/30/23 05/07/23 Yes Elayne Snare K, DO  Acetaminophen (TYLENOL PO) Take 2 tablets by mouth 3 (three) times daily as needed (tooth ache).    [provider]  albuterol (PROVENTIL HFA;VENTOLIN HFA) 108 (90 Base) MCG/ACT inhaler Inhale 1-2 puffs into the lungs every 6 (six) hours as needed for wheezing or shortness of breath. Patient taking differently:  Inhale 2 puffs into the lungs as needed for shortness of breath. 03/04/19   Grayce Sessions, NP  amitriptyline (ELAVIL) 150 MG tablet Take 150 mg by mouth at bedtime. 08/06/22   [provider]  amLODipine (NORVASC) 10 MG tablet Take 1 tablet (10 mg total) by mouth daily. 09/23/22   Quintella Reichert, MD  aspirin 81 MG chewable tablet Chew 1 tablet (81 mg total) by mouth daily. 05/09/22   Cyndi Bender, NP  azithromycin (ZITHROMAX Z-PAK) 250 MG tablet Take 2 tablets on day 1, then take 1 tablet daily for 4 days 10/18/22     baclofen (LIORESAL) 10 MG tablet Take 10 mg by mouth 3 (three) times daily. 05/05/22   [provider]  busPIRone (BUSPAR) 10 MG tablet Take 10 mg by mouth 3 (three) times daily. 01/25/19   [provider]  Cholecalciferol (DIALYVITE VITAMIN D3 MAX) 1.25 MG (50000 UT) TABS Take 1 tablet by mouth once a week. 10/08/22     Cholecalciferol (VITAMIN D3) 1.25 MG (50000 UT) CAPS Take 1 capsule by mouth once a week. 08/08/22   [provider]  Cholecalciferol 1.25 MG (50000 UT) capsule Take 1 capsule by mouth once a week. 11/11/22     furosemide (LASIX) 20 MG tablet Take 2 tablets by mouth daily X's 3 days then reduce to 1 tablet by mouth daily. Patient taking differently: Take 20 mg by mouth  daily. 05/29/22   Tereso Newcomer T, PA-C  gabapentin (NEURONTIN) 300 MG capsule Take 1 capsule (300 mg total) by mouth 3 (three) times daily. 06/29/19   Grayce Sessions, NP  gabapentin (NEURONTIN) 300 MG capsule Take 1 capsule (300 mg total) by mouth 3 (three) times daily. 10/08/22     gabapentin (NEURONTIN) 300 MG capsule Take 1 capsule (300 mg total) by mouth 3 (three) times daily. 11/11/22     LATUDA 80 MG TABS tablet Take 80 mg by mouth every evening. 02/01/19   [provider]  metoprolol tartrate (LOPRESSOR) 25 MG tablet Take 1 tablet (25 mg total) by mouth 2 (two) times daily. 09/23/22   Quintella Reichert, MD  nitrofurantoin, macrocrystal-monohydrate, (MACROBID)  100 MG capsule Take 1 capsule (100 mg total) by mouth 2 (two) times daily. 10/06/22   Elayne Snare K, DO  nitroGLYCERIN (NITROSTAT) 0.4 MG SL tablet Place 1 tablet (0.4 mg total) under the tongue every 5 (five) minutes x 3 doses as needed for chest pain. 05/09/22   Cyndi Bender, NP  NOVOLOG FLEXPEN 100 UNIT/ML FlexPen Inject 10-15 Units into the skin 3 (three) times daily as needed for high blood sugar. Sliding Scale 08/12/22   [provider]  Oxycodone HCl 10 MG TABS Take 10 mg by mouth 4 (four) times daily as needed (pain). 07/08/22   [provider]  Oxycodone HCl 10 MG TABS Take 1 tablet (10 mg total) by mouth 3 (three) times daily as needed. 11/11/22     pantoprazole (PROTONIX) 40 MG tablet Take 1 tablet (40 mg total) by mouth daily. 10/30/22     potassium chloride SA (KLOR-CON M) 20 MEQ tablet Take one tablet twice a day for five days, then take  one tablet every day. 07/17/22   Dione Booze, MD  rosuvastatin (CRESTOR) 10 MG tablet Take 10 mg by mouth at bedtime. 08/10/22   [provider]  ticagrelor (BRILINTA) 90 MG TABS tablet Take 1 tablet (90 mg total) by mouth 2 (two) times daily. Patient not taking: Reported on 07/08/2022 05/09/22   Cyndi Bender, NP  topiramate (TOPAMAX) 200 MG tablet Take 200 mg by mouth at bedtime. 01/28/19   [provider]  traZODone (DESYREL) 100 MG tablet Take 200 mg by mouth at bedtime. 02/24/19   [provider]  TRESIBA FLEXTOUCH 100 UNIT/ML FlexTouch Pen Inject 12 Units into the skin at bedtime. 07/12/22   [provider]      Allergies    Lyrica [pregabalin], Rocephin [ceftriaxone sodium in dextrose], Sulfa antibiotics, Ultram [tramadol], Vancomycin, Glucotrol [glipizide], Keflex [cephalexin], Penicillins, and Janumet [sitagliptin-metformin hcl]    Review of Systems   Review of Systems  Cardiovascular:  Positive for chest pain.    Physical Exam Updated Vital Signs BP 102/89   Pulse 94   Temp 99.6 F (37.6  C) (Oral)   Resp 18   Ht 5\' 3"  (1.6 m)   Wt 68 kg   SpO2 100%   BMI 26.57 kg/m  Physical Exam Vitals and nursing note reviewed.  Constitutional:      General: She is not in acute distress.    Appearance: She is well-developed.  HENT:     Head: Normocephalic and atraumatic.  Eyes:     Extraocular Movements: Extraocular movements intact.  Cardiovascular:     Rate and Rhythm: Normal rate and regular rhythm.     Heart sounds: Normal heart sounds.  Pulmonary:     Effort: Pulmonary effort is normal.  Breath sounds: Normal breath sounds.  Abdominal:     Palpations: Abdomen is soft.     Tenderness: There is abdominal tenderness (Epigastric). There is no guarding or rebound.  Musculoskeletal:        General: Normal range of motion.     Cervical back: Normal range of motion and neck supple.  Skin:    General: Skin is warm and dry.  Neurological:     General: No focal deficit present.     Mental Status: She is alert and oriented to person, place, and time.  Psychiatric:        Mood and Affect: Mood normal.        Behavior: Behavior normal.     ED Results / Procedures / Treatments   Labs (all labs ordered are listed, but only abnormal results are displayed) Labs Reviewed  COMPREHENSIVE METABOLIC PANEL - Abnormal; Notable for the following components:      Result Value   Potassium 3.3 (*)    Glucose, Bld 103 (*)    Creatinine, Ser 1.30 (*)    AST 12 (*)    Alkaline Phosphatase 128 (*)    GFR, Estimated 50 (*)    All other components within normal limits  CBC WITH DIFFERENTIAL/PLATELET - Abnormal; Notable for the following components:   WBC 11.6 (*)    RBC 5.88 (*)    Hemoglobin 15.4 (*)    HCT 48.4 (*)    RDW 16.7 (*)    All other components within normal limits  CBG MONITORING, ED - Abnormal; Notable for the following components:   Glucose-Capillary 119 (*)    All other components within normal limits  LIPASE, BLOOD  TROPONIN I (HIGH SENSITIVITY)    EKG EKG  Interpretation  Date/Time:  Wednesday Apr 30 2023 16:43:12 EDT Ventricular Rate:  95 PR Interval:  146 QRS Duration: 82 QT Interval:  371 QTC Calculation: 467 R Axis:   -59 Text Interpretation: Sinus rhythm Biatrial enlargement Left anterior fascicular block Abnormal R-wave progression, late transition Nonspecific T abnormalities, lateral leads No significant change since last tracing Confirmed by Elayne Snare ([id number]) on 04/30/2023 4:58:46 PM  Radiology DG Abd Acute W/Chest  Result Date: 04/30/2023 CLINICAL DATA:  Chest pain. EXAM: DG ABDOMEN ACUTE WITH 1 VIEW CHEST COMPARISON:  Chest radiograph dated 10/06/2022. FINDINGS: The lungs are clear. There is no pleural effusion or pneumothorax. The cardiac silhouette is within normal limits. No bowel dilatation or evidence of obstruction. No free air. Small calcifications or phleboliths along the right gonadal vein as well as a small bone island in the right iliac bone. Tiny radiopaque foci in the left upper abdomen may represent kidney stones. No acute osseous pathology. IMPRESSION: 1. No acute cardiopulmonary process. 2. Possible left kidney stones. Electronically Signed   By: Elgie Collard M.D.   On: 04/30/2023 17:51    Procedures Procedures    Medications Ordered in ED Medications  potassium chloride (KLOR-CON) packet 40 mEq (has no administration in time range)  famotidine (PEPCID) tablet 20 mg (20 mg Oral Given 04/30/23 1746)  alum & mag hydroxide-simeth (MAALOX/MYLANTA) 200-200-20 MG/5ML suspension 30 mL (30 mLs Oral Given 04/30/23 1746)    ED Course/ Medical Decision Making/ A&P Clinical Course as of 04/30/23 1943  Wed Apr 30, 2023  1900 Mildly elevated Alk phos and Cr appear to be at baseline for patient over the last year. Mild hypokalemia which will be repleted. Labs otherwise within normal range with no signs of severe dehydration or  anemia from possible PUD. She has been able to tolerate PO in the ED without vomiting. [VK]     Clinical Course User Index [VK] Rexford Maus, DO                             Medical Decision Making This patient presents to the ED with chief complaint(s) of chest pain with pertinent past medical history of CAD, hypertension, diabetes, GERD which further complicates the presenting complaint. The complaint involves an extensive differential diagnosis and also carries with it a high risk of complications and morbidity.    The differential diagnosis includes gastritis, GERD, esophageal stricture, ACS, arrhythmia, anemia, pancreatitis, hepatitis, gastroparesis, pneumonia, pneumothorax, pulmonary edema, pleural effusion, constipation, SBO  Additional history obtained: Additional history obtained from spouse Records reviewed outpatient cardiology records, prior ED records  ED Course and Reassessment: On patient's arrival to the emergency department she is hemodynamically stable no acute distress.  EKG was performed on arrival that showed normal sinus rhythm without acute ischemic changes.  The patient will have labs including troponin, LFTs and lipase performed.  She will be given a GI cocktail.  She will have chest and abdomen x-ray performed and she will be closely reassessed.  Independent labs interpretation:  The following labs were independently interpreted: mild hypokalemia otherwise no acute abnormalities  Independent visualization of imaging: - I independently visualized the following imaging with scope of interpretation limited to determining acute life threatening conditions related to emergency care: Chest/abd XR, which revealed no acute disease  Consultation: - Consulted or discussed management/test interpretation w/ external professional: N/A  Consideration for admission or further workup: Patient has no emergent conditions requiring admission or further work-up at this time and is stable for discharge home with primary care follow-up  Social Determinants of health:  N/A    Amount and/or Complexity of Data Reviewed Labs: ordered. Radiology: ordered.  Risk OTC drugs. Prescription drug management.          Final Clinical Impression(s) / ED Diagnoses Final diagnoses:  Acute gastritis without hemorrhage, unspecified gastritis type  Hypokalemia    Rx / DC Orders ED Discharge Orders          Ordered    Potassium Chloride 10 MEQ PACK  Daily        04/30/23 1942    famotidine (PEPCID) 20 MG tablet  2 times daily        04/30/23 1942    alum & mag hydroxide-simeth (MAALOX MAX) 400-400-40 MG/5ML suspension  Every 6 hours PRN        04/30/23 1942              Rexford Maus, DO 04/30/23 1943

## 2023-08-06 ENCOUNTER — Emergency Department (HOSPITAL_BASED_OUTPATIENT_CLINIC_OR_DEPARTMENT_OTHER)
Admission: EM | Admit: 2023-08-06 | Discharge: 2023-08-06 | Disposition: A | Discharge status: Home / Self Care | Payer: Medicare HMO | Attending: Emergency Medicine | Admitting: Emergency Medicine

## 2023-08-06 ENCOUNTER — Encounter (HOSPITAL_BASED_OUTPATIENT_CLINIC_OR_DEPARTMENT_OTHER): Payer: Self-pay | Admitting: Emergency Medicine

## 2023-08-06 ENCOUNTER — Other Ambulatory Visit: Payer: Self-pay

## 2023-08-06 DIAGNOSIS — R739 Hyperglycemia, unspecified: Secondary | ICD-10-CM | POA: Diagnosis present

## 2023-08-06 DIAGNOSIS — I10 Essential (primary) hypertension: Secondary | ICD-10-CM | POA: Insufficient documentation

## 2023-08-06 DIAGNOSIS — N3000 Acute cystitis without hematuria: Secondary | ICD-10-CM | POA: Insufficient documentation

## 2023-08-06 DIAGNOSIS — Z7982 Long term (current) use of aspirin: Secondary | ICD-10-CM | POA: Diagnosis not present

## 2023-08-06 DIAGNOSIS — Z79899 Other long term (current) drug therapy: Secondary | ICD-10-CM | POA: Diagnosis not present

## 2023-08-06 DIAGNOSIS — D72829 Elevated white blood cell count, unspecified: Secondary | ICD-10-CM | POA: Insufficient documentation

## 2023-08-06 LAB — URINALYSIS, ROUTINE W REFLEX MICROSCOPIC
Bacteria, UA: NONE SEEN
Bilirubin Urine: NEGATIVE
Glucose, UA: 500 mg/dL — AB
Hgb urine dipstick: NEGATIVE
Ketones, ur: NEGATIVE mg/dL
Nitrite: NEGATIVE
Protein, ur: NEGATIVE mg/dL
Specific Gravity, Urine: 1.008 (ref 1.005–1.030)
Squamous Epithelial / HPF: >50 /HPF (ref 0–5)
WBC, UA: >50 WBC/hpf (ref 0–5)
pH: 6.5 (ref 5.0–8.0)

## 2023-08-06 LAB — CBC
HCT: 42.8 % (ref 36.0–46.0)
Hemoglobin: 14.1 g/dL (ref 12.0–15.0)
MCH: 27.4 pg (ref 26.0–34.0)
MCHC: 32.9 g/dL (ref 30.0–36.0)
MCV: 83.3 fL (ref 80.0–100.0)
Platelets: 169 10*3/uL (ref 150–400)
RBC: 5.14 MIL/uL — ABNORMAL HIGH (ref 3.87–5.11)
RDW: 15.9 % — ABNORMAL HIGH (ref 11.5–15.5)
WBC: 10.8 10*3/uL — ABNORMAL HIGH (ref 4.0–10.5)
nRBC: 0 % (ref 0.0–0.2)

## 2023-08-06 LAB — BASIC METABOLIC PANEL
Anion gap: 11 (ref 5–15)
BUN: 15 mg/dL (ref 6–20)
CO2: 26 mmol/L (ref 22–32)
Calcium: 9.8 mg/dL (ref 8.9–10.3)
Chloride: 97 mmol/L — ABNORMAL LOW (ref 98–111)
Creatinine, Ser: 1.22 mg/dL — ABNORMAL HIGH (ref 0.44–1.00)
GFR, Estimated: 54 mL/min — ABNORMAL LOW (ref >60)
Glucose, Bld: 311 mg/dL — ABNORMAL HIGH (ref 70–99)
Potassium: 3.5 mmol/L (ref 3.5–5.1)
Sodium: 134 mmol/L — ABNORMAL LOW (ref 135–145)

## 2023-08-06 LAB — CBG MONITORING, ED
Glucose-Capillary: 295 mg/dL — ABNORMAL HIGH (ref 70–99)
Glucose-Capillary: 231 mg/dL — ABNORMAL HIGH (ref 70–99)

## 2023-08-06 MED ORDER — NITROFURANTOIN MONOHYD MACRO 100 MG PO CAPS: 100.0000 mg | ORAL_CAPSULE | Freq: Two times a day (BID) | ORAL | 0 refills | Status: DC

## 2023-08-06 MED ORDER — SODIUM CHLORIDE 0.9 % IV BOLUS
1000.0000 mL | Freq: Once | INTRAVENOUS | Status: AC
  Administered 2023-08-06: 1000 mL via INTRAVENOUS

## 2023-08-06 MED ORDER — NITROFURANTOIN MONOHYD MACRO 100 MG PO CAPS
100.0000 mg | ORAL_CAPSULE | Freq: Once | ORAL | Status: AC
  Administered 2023-08-06: 100 mg via ORAL
  Filled 2023-08-06: qty 1

## 2023-08-06 NOTE — ED Provider Notes (Signed)
EMERGENCY DEPARTMENT AT [facility] Provider Note   CSN: [id number] Arrival date & time: 08/06/23  1203     History  Chief Complaint  Patient presents with   Hypertension   Hyperglycemia    Stephanie Thornton is a 52 y.o. female.  Patient presents to the emergency department for evaluation of high blood sugar and hypertension.  Patient had an appointment at an urgent care.  She states that she goes there for her chronic pain medication.  She takes oxycodone for chronic back pain.  She states that she was referred here for elevated blood sugar and high blood pressure.  Patient denies other medical complaints.  No chest pain or shortness of breath.  No vomiting or abdominal pain.  She denies increased urination.  She takes NovoLog and denies any missed doses.  She states that her niece was also with her at this appointment, however she left to care for family and will be returning later.       Home Medications Prior to Admission medications   Medication Sig Start Date End Date Taking? Authorizing Provider  Acetaminophen (TYLENOL PO) Take 2 tablets by mouth 3 (three) times daily as needed (tooth ache).    [provider]  albuterol (PROVENTIL HFA;VENTOLIN HFA) 108 (90 Base) MCG/ACT inhaler Inhale 1-2 puffs into the lungs every 6 (six) hours as needed for wheezing or shortness of breath. Patient taking differently: Inhale 2 puffs into the lungs as needed for shortness of breath. 03/04/19   Grayce Sessions, NP  alum & mag hydroxide-simeth (MAALOX MAX) 400-400-40 MG/5ML suspension Take 15 mLs by mouth every 6 (six) hours as needed for indigestion. 04/30/23   Elayne Snare K, DO  amitriptyline (ELAVIL) 150 MG tablet Take 150 mg by mouth at bedtime. 08/06/22   [provider]  amLODipine (NORVASC) 10 MG tablet Take 1 tablet (10 mg total) by mouth daily. 09/23/22   Quintella Reichert, MD  aspirin 81 MG chewable tablet Chew 1 tablet (81 mg total) by mouth  daily. 05/09/22   Cyndi Bender, NP  azithromycin (ZITHROMAX Z-PAK) 250 MG tablet Take 2 tablets on day 1, then take 1 tablet daily for 4 days 10/18/22     baclofen (LIORESAL) 10 MG tablet Take 10 mg by mouth 3 (three) times daily. 05/05/22   [provider]  busPIRone (BUSPAR) 10 MG tablet Take 10 mg by mouth 3 (three) times daily. 01/25/19   [provider]  Cholecalciferol (DIALYVITE VITAMIN D3 MAX) 1.25 MG (50000 UT) TABS Take 1 tablet by mouth once a week. 10/08/22     Cholecalciferol (VITAMIN D3) 1.25 MG (50000 UT) CAPS Take 1 capsule by mouth once a week. 08/08/22   [provider]  Cholecalciferol 1.25 MG (50000 UT) capsule Take 1 capsule by mouth once a week. 11/11/22     famotidine (PEPCID) 20 MG tablet Take 1 tablet (20 mg total) by mouth 2 (two) times daily. 04/30/23   Elayne Snare K, DO  furosemide (LASIX) 20 MG tablet Take 2 tablets by mouth daily X's 3 days then reduce to 1 tablet by mouth daily. Patient taking differently: Take 20 mg by mouth daily. 05/29/22   Tereso Newcomer T, PA-C  gabapentin (NEURONTIN) 300 MG capsule Take 1 capsule (300 mg total) by mouth 3 (three) times daily. 06/29/19   Grayce Sessions, NP  gabapentin (NEURONTIN) 300 MG capsule Take 1 capsule (300 mg total) by mouth 3 (three) times daily. 10/08/22  gabapentin (NEURONTIN) 300 MG capsule Take 1 capsule (300 mg total) by mouth 3 (three) times daily. 11/11/22     LATUDA 80 MG TABS tablet Take 80 mg by mouth every evening. 02/01/19   [provider]  metoprolol tartrate (LOPRESSOR) 25 MG tablet Take 1 tablet (25 mg total) by mouth 2 (two) times daily. 09/23/22   Quintella Reichert, MD  nitrofurantoin, macrocrystal-monohydrate, (MACROBID) 100 MG capsule Take 1 capsule (100 mg total) by mouth 2 (two) times daily. 10/06/22   Elayne Snare K, DO  nitroGLYCERIN (NITROSTAT) 0.4 MG SL tablet Place 1 tablet (0.4 mg total) under the tongue every 5 (five) minutes x 3 doses as needed for chest  pain. 05/09/22   Cyndi Bender, NP  NOVOLOG FLEXPEN 100 UNIT/ML FlexPen Inject 10-15 Units into the skin 3 (three) times daily as needed for high blood sugar. Sliding Scale 08/12/22   [provider]  Oxycodone HCl 10 MG TABS Take 10 mg by mouth 4 (four) times daily as needed (pain). 07/08/22   [provider]  Oxycodone HCl 10 MG TABS Take 1 tablet (10 mg total) by mouth 3 (three) times daily as needed. 11/11/22     pantoprazole (PROTONIX) 40 MG tablet Take 1 tablet (40 mg total) by mouth daily. 10/30/22     Potassium Chloride 10 MEQ PACK Take 10 mEq by mouth daily for 7 days. 04/30/23 05/07/23  Elayne Snare K, DO  potassium chloride SA (KLOR-CON M) 20 MEQ tablet Take one tablet twice a day for five days, then take  one tablet every day. 07/17/22   Dione Booze, MD  rosuvastatin (CRESTOR) 10 MG tablet Take 10 mg by mouth at bedtime. 08/10/22   [provider]  ticagrelor (BRILINTA) 90 MG TABS tablet Take 1 tablet (90 mg total) by mouth 2 (two) times daily. Patient not taking: Reported on 07/08/2022 05/09/22   Cyndi Bender, NP  topiramate (TOPAMAX) 200 MG tablet Take 200 mg by mouth at bedtime. 01/28/19   [provider]  traZODone (DESYREL) 100 MG tablet Take 200 mg by mouth at bedtime. 02/24/19   [provider]  TRESIBA FLEXTOUCH 100 UNIT/ML FlexTouch Pen Inject 12 Units into the skin at bedtime. 07/12/22   [provider]      Allergies    Lyrica [pregabalin], Rocephin [ceftriaxone sodium in dextrose], Sulfa antibiotics, Ultram [tramadol], Vancomycin, Glucotrol [glipizide], Keflex [cephalexin], Penicillins, and Janumet [sitagliptin-metformin hcl]    Review of Systems   Review of Systems  Physical Exam Updated Vital Signs BP (!) 135/90 (BP Location: Right Arm)   Pulse 81   Temp 98.7 F (37.1 C) (Oral)   Resp 18   Ht 5\' 2"  (1.575 m)   Wt 67.6 kg   SpO2 95%   BMI 27.25 kg/m   Physical Exam Vitals and nursing note reviewed.  Constitutional:       General: She is not in acute distress.    Appearance: She is well-developed.  HENT:     Head: Normocephalic and atraumatic.     Right Ear: External ear normal.     Left Ear: External ear normal.     Nose: Nose normal.     Mouth/Throat:     Mouth: Mucous membranes are moist.  Eyes:     Conjunctiva/sclera: Conjunctivae normal.  Cardiovascular:     Rate and Rhythm: Normal rate and regular rhythm.     Heart sounds: No murmur heard. Pulmonary:     Effort: No respiratory distress.  Breath sounds: No wheezing, rhonchi or rales.  Abdominal:     Palpations: Abdomen is soft.     Tenderness: There is no abdominal tenderness. There is no guarding or rebound.  Musculoskeletal:     Cervical back: Normal range of motion and neck supple.     Right lower leg: No edema.     Left lower leg: No edema.  Skin:    General: Skin is warm and dry.     Findings: No rash.  Neurological:     General: No focal deficit present.     Mental Status: She is alert. Mental status is at baseline.     Motor: No weakness.     Comments: Drowsy on exam but arousable to voice  Psychiatric:        Mood and Affect: Mood normal.     ED Results / Procedures / Treatments   Labs (all labs ordered are listed, but only abnormal results are displayed) Labs Reviewed  BASIC METABOLIC PANEL - Abnormal; Notable for the following components:      Result Value   Sodium 134 (*)    Chloride 97 (*)    Glucose, Bld 311 (*)    Creatinine, Ser 1.22 (*)    GFR, Estimated 54 (*)    All other components within normal limits  CBC - Abnormal; Notable for the following components:   WBC 10.8 (*)    RBC 5.14 (*)    RDW 15.9 (*)    All other components within normal limits  URINALYSIS, ROUTINE W REFLEX MICROSCOPIC - Abnormal; Notable for the following components:   APPearance HAZY (*)    Glucose, UA 500 (*)    Leukocytes,Ua LARGE (*)    All other components within normal limits  CBG MONITORING, ED - Abnormal; Notable for  the following components:   Glucose-Capillary 295 (*)    All other components within normal limits  CBG MONITORING, ED - Abnormal; Notable for the following components:   Glucose-Capillary 231 (*)    All other components within normal limits  URINE CULTURE    EKG EKG Interpretation Date/Time:  Wednesday August 06 2023 12:16:52 EDT Ventricular Rate:  80 PR Interval:  180 QRS Duration:  91 QT Interval:  403 QTC Calculation: 465 R Axis:   -25  Text Interpretation: Sinus rhythm Probable left atrial enlargement Borderline left axis deviation Consider anterior infarct Nonspecific T abnormalities, lateral leads Confirmed by Ernie Avena ([id number]) on 08/06/2023 12:30:15 PM  Radiology No results found.  Procedures Procedures    Medications Ordered in ED Medications  sodium chloride 0.9 % bolus 1,000 mL (0 mLs Intravenous Stopped 08/06/23 1430)  sodium chloride 0.9 % bolus 1,000 mL (0 mLs Intravenous Stopped 08/06/23 1756)  nitrofurantoin (macrocrystal-monohydrate) (MACROBID) capsule 100 mg (100 mg Oral Given 08/06/23 1841)    ED Course/ Medical Decision Making/ A&P    Patient seen and examined. History obtained directly from patient.   Labs/EKG: Ordered CBC, BMP, UA.  EKG personally reviewed and interpreted as above, no significant changes from May 2024.  Imaging: None ordered  Medications/Fluids: IV fluid bolus  Most recent vital signs reviewed and are as follows: BP (!) 135/90 (BP Location: Right Arm)   Pulse 81   Temp 98.7 F (37.1 C) (Oral)   Resp 18   Ht 5\' 2"  (1.575 m)   Wt 67.6 kg   SpO2 95%   BMI 27.25 kg/m   Initial impression: Blood pressure here is near normal, no treatment needed for  this.  Will give IV fluids and evaluate for complications of hyperglycemia.  Patient is a bit drowsy and it would be helpful to get additional history from family as well.  7:10 PM Reassessment performed. Patient appears improved now.  She is sitting at bedside.  She is awake and  alert.  She has received 2 L of IV fluids and has been able to urinate.  Labs personally reviewed and interpreted including: CBC with minimally elevated white blood cell count of 10.8, hemoglobin 14.1; BMP with elevated glucose at 311 with normal anion gap and normal bicarbonate, creatinine 1.22; UA not clean-catch but she has greater than 50 white blood cells and white blood cell clumps with large leukocyte esterase.  Urine culture sent.  Will initiate therapy with Macrobid given multiple allergies.  Reviewed pertinent lab work and imaging with patient at bedside. Questions answered.   Most current vital signs reviewed and are as follows: BP (!) 180/111   Pulse 84   Temp 97.8 F (36.6 C) (Oral)   Resp 12   Ht 5\' 2"  (1.575 m)   Wt 67.6 kg   SpO2 98%   BMI 27.25 kg/m   Plan: Discharge to home.   Prescriptions written for: Macrobid  Other home care instructions discussed: Continue antihyperglycemic regimen, maintain good hydration.  ED return instructions discussed: Fever, vomiting, worsening pain, new or worsening symptoms.  Follow-up instructions discussed: Patient encouraged to follow-up with their PCP in 3 days.                                   Medical Decision Making Amount and/or Complexity of Data Reviewed Labs: ordered.  Risk Prescription drug management.   Patient sent in today for evaluation of high blood pressure and hyperglycemia.  Suspect that potential UTI is contributing to poor blood sugar control.  Her blood pressure here on arrival was near normal, elevated at time of discharge.  No concern for endorgan damage.  Patient will continue her home medication regimen.  Do not suspect DKA or other diabetic complication today.  Patient has been hydrated and has done well.  The patient's vital signs, pertinent lab work and imaging were reviewed and interpreted as discussed in the ED course. Hospitalization was considered for further testing, treatments, or serial  exams/observation. However as patient is well-appearing, has a stable exam, and reassuring studies today, I do not feel that they warrant admission at this time. This plan was discussed with the patient who verbalizes agreement and comfort with this plan and seems reliable and able to return to the Emergency Department with worsening or changing symptoms.          Final Clinical Impression(s) / ED Diagnoses Final diagnoses:  Hyperglycemia  Acute cystitis without hematuria    Rx / DC Orders ED Discharge Orders          Ordered    nitrofurantoin, macrocrystal-monohydrate, (MACROBID) 100 MG capsule  2 times daily        08/06/23 1852              Renne Crigler, Cordelia Poche 08/06/23 1913    Ernie Avena, MD 08/06/23 2040

## 2023-08-06 NOTE — Discharge Instructions (Addendum)
Please read and follow all provided instructions.  Your diagnoses today include:  1. Hyperglycemia   2. Acute cystitis without hematuria     Tests performed today include: Blood sugar was elevated in the 300s Urine test suggested a urinary tract infection Urine culture is pending, you will be notified if your antibiotic needs to be changed Vital signs. See below for your results today.   Medications prescribed:  Macrobid - antibiotic for urinary tract infection  You have been prescribed an antibiotic medicine: take the entire course of medicine even if you are feeling better. Stopping early can cause the antibiotic not to work.  Take any prescribed medications only as directed.  Home care instructions:  Follow any educational materials contained in this packet.  BE VERY CAREFUL not to take multiple medicines containing Tylenol (also called acetaminophen). Doing so can lead to an overdose which can damage your liver and cause liver failure and possibly death.   Follow-up instructions: Please follow-up with your primary care provider in the next 2-3 days for further evaluation of your symptoms.   Return instructions:  Please return to the Emergency Department if you experience worsening symptoms.  Please return if you have any other emergent concerns.  Additional Information:  Your vital signs today were: BP (!) 180/111   Pulse 84   Temp 97.8 F (36.6 C) (Oral)   Resp 12   Ht 5\' 2"  (1.575 m)   Wt 67.6 kg   SpO2 98%   BMI 27.25 kg/m  If your blood pressure (BP) was elevated above 135/85 this visit, please have this repeated by your doctor within one month. --------------

## 2023-08-06 NOTE — ED Notes (Signed)
Discharge instructions, follow up care, and prescription reviewed and explained. Pt verbalized understanding and had no further questions on d/c. Pt caox4, ambulatory, NAD on d/c.

## 2023-08-06 NOTE — ED Triage Notes (Signed)
Pt via pov from UC; was sent for hypertension and hyperglycemia. Pt states her glucose was over 300. Pt takes novolog and has been taking it as directed. Pt alert &oriented, nad noted.

## 2023-08-09 ENCOUNTER — Telehealth (HOSPITAL_BASED_OUTPATIENT_CLINIC_OR_DEPARTMENT_OTHER): Payer: Self-pay | Admitting: *Deleted

## 2023-08-09 NOTE — Telephone Encounter (Signed)
Post ED Visit - Positive Culture Follow-up  Culture report reviewed by antimicrobial stewardship pharmacist: [facility] []  Enzo Bi, Pharm.D. []  Celedonio Miyamoto, Pharm.D., BCPS AQ-ID []  Garvin Fila, Pharm.D., BCPS []  Georgina Pillion, [address].D., BCPS []  [place], [address].D., BCPS, AAHIVP []  Estella Husk, Pharm.D., BCPS, AAHIVP []  Lysle Pearl, PharmD, BCPS []  Phillips Climes, PharmD, BCPS []  Agapito Games, PharmD, BCPS []  Verlan Friends, PharmD []  Mervyn Gay, PharmD, BCPS [x]  Daylene Posey, PharmD  [facility] []  Len Childs, PharmD []  Greer Pickerel, PharmD []  Adalberto Cole, PharmD []  Perlie Gold, Rph []  Lonell Face) Jean Rosenthal, PharmD []  Earl Many, PharmD []  Junita Push, PharmD []  Dorna Leitz, PharmD []  Terrilee Files, PharmD []  Lynann Beaver, PharmD []  Keturah Barre, PharmD []  Loralee Pacas, PharmD []  Bernadene Person, PharmD   Positive urine culture Treated with Nitrofurantoin, organism sensitive to the same and no further patient follow-up is required at this time.  Stephanie Thornton 08/09/2023, 11:28 AM

## 2023-08-13 ENCOUNTER — Telehealth (HOSPITAL_BASED_OUTPATIENT_CLINIC_OR_DEPARTMENT_OTHER): Payer: Self-pay

## 2023-08-13 NOTE — Telephone Encounter (Signed)
Post ED Visit - Positive Culture Follow-up  Culture report reviewed by antimicrobial stewardship pharmacist: [facility] [x]  [facility], [place].D. []  Celedonio Miyamoto, Pharm.D., BCPS AQ-ID []  Garvin Fila, Pharm.D., BCPS []  Georgina Pillion, Pharm.D., BCPS []  [place], [address].D., BCPS, AAHIVP []  Estella Husk, Pharm.D., BCPS, AAHIVP []  Lysle Pearl, PharmD, BCPS []  Phillips Climes, PharmD, BCPS []  Agapito Games, PharmD, BCPS []  Verlan Friends, PharmD []  Mervyn Gay, PharmD, BCPS []  Vinnie Level, PharmD  [facility] []  Len Childs, PharmD []  Greer Pickerel, PharmD []  Adalberto Cole, PharmD []  Perlie Gold, Rph []  Lonell Face) Jean Rosenthal, PharmD []  Earl Many, PharmD []  Junita Push, PharmD []  Dorna Leitz, PharmD []  Terrilee Files, PharmD []  Lynann Beaver, PharmD []  Keturah Barre, PharmD []  Loralee Pacas, PharmD []  Bernadene Person, PharmD   Positive urine culture Treated with Nitrofuarntoin Monohyd Macro, organism sensitive to the same and no further patient follow-up is required at this time.  Sandria Senter 08/13/2023, 8:33 AM

## 2023-09-03 ENCOUNTER — Ambulatory Visit: Payer: Medicare HMO | Admitting: Podiatry

## 2023-09-05 ENCOUNTER — Emergency Department (HOSPITAL_COMMUNITY): Payer: Medicare HMO

## 2023-09-05 ENCOUNTER — Encounter (HOSPITAL_COMMUNITY): Payer: Self-pay

## 2023-09-05 ENCOUNTER — Other Ambulatory Visit: Payer: Self-pay

## 2023-09-05 ENCOUNTER — Inpatient Hospital Stay (HOSPITAL_COMMUNITY)
Admission: EM | Admit: 2023-09-05 | Discharge: 2023-09-07 | DRG: 637 | Disposition: A | Discharge status: Home / Self Care | Payer: Medicare HMO | Attending: Critical Care Medicine | Admitting: Critical Care Medicine

## 2023-09-05 DIAGNOSIS — E1100 Type 2 diabetes mellitus with hyperosmolarity without nonketotic hyperglycemic-hyperosmolar coma (NKHHC): Secondary | ICD-10-CM | POA: Diagnosis not present

## 2023-09-05 DIAGNOSIS — Z886 Allergy status to analgesic agent status: Secondary | ICD-10-CM

## 2023-09-05 DIAGNOSIS — Z7982 Long term (current) use of aspirin: Secondary | ICD-10-CM

## 2023-09-05 DIAGNOSIS — E1165 Type 2 diabetes mellitus with hyperglycemia: Secondary | ICD-10-CM | POA: Diagnosis present

## 2023-09-05 DIAGNOSIS — K219 Gastro-esophageal reflux disease without esophagitis: Secondary | ICD-10-CM | POA: Insufficient documentation

## 2023-09-05 DIAGNOSIS — Z91199 Patient's noncompliance with other medical treatment and regimen due to unspecified reason: Secondary | ICD-10-CM

## 2023-09-05 DIAGNOSIS — E1101 Type 2 diabetes mellitus with hyperosmolarity with coma: Secondary | ICD-10-CM | POA: Diagnosis present

## 2023-09-05 DIAGNOSIS — Z885 Allergy status to narcotic agent status: Secondary | ICD-10-CM

## 2023-09-05 DIAGNOSIS — R000 Tachycardia, unspecified: Secondary | ICD-10-CM | POA: Diagnosis present

## 2023-09-05 DIAGNOSIS — R748 Abnormal levels of other serum enzymes: Secondary | ICD-10-CM | POA: Diagnosis present

## 2023-09-05 DIAGNOSIS — R410 Disorientation, unspecified: Secondary | ICD-10-CM

## 2023-09-05 DIAGNOSIS — Z823 Family history of stroke: Secondary | ICD-10-CM

## 2023-09-05 DIAGNOSIS — Z8249 Family history of ischemic heart disease and other diseases of the circulatory system: Secondary | ICD-10-CM

## 2023-09-05 DIAGNOSIS — Z955 Presence of coronary angioplasty implant and graft: Secondary | ICD-10-CM

## 2023-09-05 DIAGNOSIS — E785 Hyperlipidemia, unspecified: Secondary | ICD-10-CM | POA: Diagnosis present

## 2023-09-05 DIAGNOSIS — I10 Essential (primary) hypertension: Secondary | ICD-10-CM | POA: Diagnosis present

## 2023-09-05 DIAGNOSIS — Z7902 Long term (current) use of antithrombotics/antiplatelets: Secondary | ICD-10-CM

## 2023-09-05 DIAGNOSIS — I2510 Atherosclerotic heart disease of native coronary artery without angina pectoris: Secondary | ICD-10-CM | POA: Diagnosis present

## 2023-09-05 DIAGNOSIS — Z79899 Other long term (current) drug therapy: Secondary | ICD-10-CM

## 2023-09-05 DIAGNOSIS — Z818 Family history of other mental and behavioral disorders: Secondary | ICD-10-CM

## 2023-09-05 DIAGNOSIS — Z8711 Personal history of peptic ulcer disease: Secondary | ICD-10-CM

## 2023-09-05 DIAGNOSIS — Z882 Allergy status to sulfonamides status: Secondary | ICD-10-CM

## 2023-09-05 DIAGNOSIS — I672 Cerebral atherosclerosis: Secondary | ICD-10-CM | POA: Diagnosis present

## 2023-09-05 DIAGNOSIS — I69952 Hemiplegia and hemiparesis following unspecified cerebrovascular disease affecting left dominant side: Secondary | ICD-10-CM

## 2023-09-05 DIAGNOSIS — I252 Old myocardial infarction: Secondary | ICD-10-CM

## 2023-09-05 DIAGNOSIS — Z7951 Long term (current) use of inhaled steroids: Secondary | ICD-10-CM

## 2023-09-05 DIAGNOSIS — G9341 Metabolic encephalopathy: Secondary | ICD-10-CM | POA: Insufficient documentation

## 2023-09-05 DIAGNOSIS — R739 Hyperglycemia, unspecified: Principal | ICD-10-CM

## 2023-09-05 DIAGNOSIS — Z91148 Patient's other noncompliance with medication regimen for other reason: Secondary | ICD-10-CM

## 2023-09-05 DIAGNOSIS — F319 Bipolar disorder, unspecified: Secondary | ICD-10-CM | POA: Diagnosis present

## 2023-09-05 DIAGNOSIS — Z794 Long term (current) use of insulin: Secondary | ICD-10-CM

## 2023-09-05 DIAGNOSIS — Z87892 Personal history of anaphylaxis: Secondary | ICD-10-CM

## 2023-09-05 DIAGNOSIS — Z880 Allergy status to penicillin: Secondary | ICD-10-CM

## 2023-09-05 DIAGNOSIS — Z90710 Acquired absence of both cervix and uterus: Secondary | ICD-10-CM

## 2023-09-05 DIAGNOSIS — E872 Acidosis, unspecified: Secondary | ICD-10-CM | POA: Diagnosis present

## 2023-09-05 DIAGNOSIS — N179 Acute kidney failure, unspecified: Secondary | ICD-10-CM | POA: Diagnosis present

## 2023-09-05 DIAGNOSIS — Z87891 Personal history of nicotine dependence: Secondary | ICD-10-CM

## 2023-09-05 DIAGNOSIS — I161 Hypertensive emergency: Secondary | ICD-10-CM | POA: Diagnosis present

## 2023-09-05 DIAGNOSIS — Z881 Allergy status to other antibiotic agents status: Secondary | ICD-10-CM

## 2023-09-05 LAB — I-STAT VENOUS BLOOD GAS, ED
Acid-Base Excess: 0 mmol/L (ref 0.0–2.0)
Bicarbonate: 21.4 mmol/L (ref 20.0–28.0)
Calcium, Ion: 1.12 mmol/L — ABNORMAL LOW (ref 1.15–1.40)
HCT: 55 % — ABNORMAL HIGH (ref 36.0–46.0)
Hemoglobin: 18.7 g/dL — ABNORMAL HIGH (ref 12.0–15.0)
O2 Saturation: 93 %
Potassium: 5.2 mmol/L — ABNORMAL HIGH (ref 3.5–5.1)
Sodium: 131 mmol/L — ABNORMAL LOW (ref 135–145)
TCO2: 22 mmol/L (ref 22–32)
pCO2, Ven: 27.8 mmHg — ABNORMAL LOW (ref 44–60)
pH, Ven: 7.495 — ABNORMAL HIGH (ref 7.25–7.43)
pO2, Ven: 60 mmHg — ABNORMAL HIGH (ref 32–45)

## 2023-09-05 LAB — CBC WITH DIFFERENTIAL/PLATELET
Abs Immature Granulocytes: 0.04 10*3/uL (ref 0.00–0.07)
Basophils Absolute: 0 10*3/uL (ref 0.0–0.1)
Basophils Relative: 0 %
Eosinophils Absolute: 0 10*3/uL (ref 0.0–0.5)
Eosinophils Relative: 0 %
HCT: 51.3 % — ABNORMAL HIGH (ref 36.0–46.0)
Hemoglobin: 17 g/dL — ABNORMAL HIGH (ref 12.0–15.0)
Immature Granulocytes: 0 %
Lymphocytes Relative: 22 %
Lymphs Abs: 2.3 10*3/uL (ref 0.7–4.0)
MCH: 28 pg (ref 26.0–34.0)
MCHC: 33.1 g/dL (ref 30.0–36.0)
MCV: 84.4 fL (ref 80.0–100.0)
Monocytes Absolute: 0.6 10*3/uL (ref 0.1–1.0)
Monocytes Relative: 5 %
Neutro Abs: 7.5 10*3/uL (ref 1.7–7.7)
Neutrophils Relative %: 73 %
Platelets: 216 10*3/uL (ref 150–400)
RBC: 6.08 MIL/uL — ABNORMAL HIGH (ref 3.87–5.11)
RDW: 14.4 % (ref 11.5–15.5)
WBC: 10.4 10*3/uL (ref 4.0–10.5)
nRBC: 0 % (ref 0.0–0.2)

## 2023-09-05 LAB — CBG MONITORING, ED
Glucose-Capillary: >600 mg/dL (ref 70–99)
Glucose-Capillary: 551 mg/dL (ref 70–99)

## 2023-09-05 LAB — URINALYSIS, ROUTINE W REFLEX MICROSCOPIC
Bacteria, UA: NONE SEEN
Bilirubin Urine: NEGATIVE
Glucose, UA: >=500 mg/dL — AB
Hgb urine dipstick: NEGATIVE
Ketones, ur: 20 mg/dL — AB
Leukocytes,Ua: NEGATIVE
Nitrite: NEGATIVE
Protein, ur: NEGATIVE mg/dL
Specific Gravity, Urine: 1.035 — ABNORMAL HIGH (ref 1.005–1.030)
pH: 6 (ref 5.0–8.0)

## 2023-09-05 LAB — ETHANOL: Alcohol, Ethyl (B): <10 mg/dL (ref <10)

## 2023-09-05 LAB — BETA-HYDROXYBUTYRIC ACID: Beta-Hydroxybutyric Acid: 1.44 mmol/L — ABNORMAL HIGH (ref 0.05–0.27)

## 2023-09-05 MED ORDER — DEXTROSE IN LACTATED RINGERS 5 % IV SOLN: INTRAVENOUS | Status: DC

## 2023-09-05 MED ORDER — HYDRALAZINE HCL 20 MG/ML IJ SOLN
10.0000 mg | Freq: Once | INTRAMUSCULAR | Status: AC
  Administered 2023-09-05: 10 mg via INTRAVENOUS
  Filled 2023-09-05: qty 1

## 2023-09-05 MED ORDER — DEXTROSE 50 % IV SOLN: 0.0000 mL | INTRAVENOUS | Status: DC | PRN

## 2023-09-05 MED ORDER — LACTATED RINGERS IV BOLUS
1000.0000 mL | INTRAVENOUS | Status: AC
  Administered 2023-09-05 (×2): 1000 mL via INTRAVENOUS

## 2023-09-05 MED ORDER — POTASSIUM CHLORIDE 10 MEQ/100ML IV SOLN
10.0000 meq | INTRAVENOUS | Status: AC
  Administered 2023-09-05 – 2023-09-06 (×2): 10 meq via INTRAVENOUS
  Filled 2023-09-05 (×2): qty 100

## 2023-09-05 MED ORDER — LACTATED RINGERS IV SOLN: INTRAVENOUS | Status: DC

## 2023-09-05 MED ORDER — INSULIN REGULAR(HUMAN) IN NACL 100-0.9 UT/100ML-% IV SOLN
INTRAVENOUS | Status: DC
  Administered 2023-09-05: 11.5 [IU]/h via INTRAVENOUS
  Filled 2023-09-05: qty 100

## 2023-09-05 NOTE — ED Triage Notes (Signed)
Patient BIB EMS from home due to altered mental status and hyperglycemia. Per family altered since Tuesday and has been reading high since. Last insulin dose on Tuesday. Thirsty and frequent urination. Patient c/o of malaise. Hx stroke w left sided deficits.   224/140 Hr: 116 97% RA CBG: 394 22 LFA

## 2023-09-06 ENCOUNTER — Emergency Department (HOSPITAL_COMMUNITY): Payer: Medicare HMO

## 2023-09-06 DIAGNOSIS — Z7951 Long term (current) use of inhaled steroids: Secondary | ICD-10-CM | POA: Diagnosis not present

## 2023-09-06 DIAGNOSIS — E872 Acidosis, unspecified: Secondary | ICD-10-CM | POA: Diagnosis present

## 2023-09-06 DIAGNOSIS — E1101 Type 2 diabetes mellitus with hyperosmolarity with coma: Secondary | ICD-10-CM | POA: Diagnosis not present

## 2023-09-06 DIAGNOSIS — E1100 Type 2 diabetes mellitus with hyperosmolarity without nonketotic hyperglycemic-hyperosmolar coma (NKHHC): Secondary | ICD-10-CM | POA: Diagnosis present

## 2023-09-06 DIAGNOSIS — Z794 Long term (current) use of insulin: Secondary | ICD-10-CM | POA: Diagnosis not present

## 2023-09-06 DIAGNOSIS — E785 Hyperlipidemia, unspecified: Secondary | ICD-10-CM | POA: Diagnosis present

## 2023-09-06 DIAGNOSIS — F319 Bipolar disorder, unspecified: Secondary | ICD-10-CM | POA: Diagnosis present

## 2023-09-06 DIAGNOSIS — Z818 Family history of other mental and behavioral disorders: Secondary | ICD-10-CM | POA: Diagnosis not present

## 2023-09-06 DIAGNOSIS — Z955 Presence of coronary angioplasty implant and graft: Secondary | ICD-10-CM | POA: Diagnosis not present

## 2023-09-06 DIAGNOSIS — I252 Old myocardial infarction: Secondary | ICD-10-CM | POA: Diagnosis not present

## 2023-09-06 DIAGNOSIS — G9341 Metabolic encephalopathy: Secondary | ICD-10-CM | POA: Diagnosis present

## 2023-09-06 DIAGNOSIS — I161 Hypertensive emergency: Secondary | ICD-10-CM | POA: Diagnosis present

## 2023-09-06 DIAGNOSIS — Z7982 Long term (current) use of aspirin: Secondary | ICD-10-CM | POA: Diagnosis not present

## 2023-09-06 DIAGNOSIS — I2510 Atherosclerotic heart disease of native coronary artery without angina pectoris: Secondary | ICD-10-CM | POA: Diagnosis present

## 2023-09-06 DIAGNOSIS — Z8249 Family history of ischemic heart disease and other diseases of the circulatory system: Secondary | ICD-10-CM | POA: Diagnosis not present

## 2023-09-06 DIAGNOSIS — N179 Acute kidney failure, unspecified: Secondary | ICD-10-CM | POA: Diagnosis present

## 2023-09-06 DIAGNOSIS — I672 Cerebral atherosclerosis: Secondary | ICD-10-CM | POA: Diagnosis present

## 2023-09-06 DIAGNOSIS — K219 Gastro-esophageal reflux disease without esophagitis: Secondary | ICD-10-CM | POA: Diagnosis present

## 2023-09-06 DIAGNOSIS — Z8711 Personal history of peptic ulcer disease: Secondary | ICD-10-CM | POA: Diagnosis not present

## 2023-09-06 DIAGNOSIS — I10 Essential (primary) hypertension: Secondary | ICD-10-CM | POA: Diagnosis present

## 2023-09-06 DIAGNOSIS — E1165 Type 2 diabetes mellitus with hyperglycemia: Secondary | ICD-10-CM | POA: Diagnosis present

## 2023-09-06 DIAGNOSIS — Z87891 Personal history of nicotine dependence: Secondary | ICD-10-CM | POA: Diagnosis not present

## 2023-09-06 DIAGNOSIS — Z7902 Long term (current) use of antithrombotics/antiplatelets: Secondary | ICD-10-CM | POA: Diagnosis not present

## 2023-09-06 DIAGNOSIS — I69952 Hemiplegia and hemiparesis following unspecified cerebrovascular disease affecting left dominant side: Secondary | ICD-10-CM | POA: Diagnosis not present

## 2023-09-06 DIAGNOSIS — R748 Abnormal levels of other serum enzymes: Secondary | ICD-10-CM | POA: Diagnosis present

## 2023-09-06 DIAGNOSIS — R739 Hyperglycemia, unspecified: Secondary | ICD-10-CM

## 2023-09-06 LAB — BASIC METABOLIC PANEL
Anion gap: 20 — ABNORMAL HIGH (ref 5–15)
Anion gap: 15 (ref 5–15)
Anion gap: 10 (ref 5–15)
BUN: 16 mg/dL (ref 6–20)
BUN: 13 mg/dL (ref 6–20)
BUN: 12 mg/dL (ref 6–20)
CO2: 21 mmol/L — ABNORMAL LOW (ref 22–32)
CO2: 19 mmol/L — ABNORMAL LOW (ref 22–32)
CO2: 20 mmol/L — ABNORMAL LOW (ref 22–32)
Calcium: 10.6 mg/dL — ABNORMAL HIGH (ref 8.9–10.3)
Calcium: 9.8 mg/dL (ref 8.9–10.3)
Calcium: 9.5 mg/dL (ref 8.9–10.3)
Chloride: 95 mmol/L — ABNORMAL LOW (ref 98–111)
Chloride: 99 mmol/L (ref 98–111)
Chloride: 108 mmol/L (ref 98–111)
Creatinine, Ser: 1.03 mg/dL — ABNORMAL HIGH (ref 0.44–1.00)
Creatinine, Ser: 0.96 mg/dL (ref 0.44–1.00)
Creatinine, Ser: 0.69 mg/dL (ref 0.44–1.00)
GFR, Estimated: >60 mL/min (ref >60)
GFR, Estimated: >60 mL/min (ref >60)
GFR, Estimated: >60 mL/min (ref >60)
Glucose, Bld: 654 mg/dL (ref 70–99)
Glucose, Bld: 354 mg/dL — ABNORMAL HIGH (ref 70–99)
Glucose, Bld: 181 mg/dL — ABNORMAL HIGH (ref 70–99)
Potassium: 3.9 mmol/L (ref 3.5–5.1)
Potassium: 3.5 mmol/L (ref 3.5–5.1)
Potassium: 3.7 mmol/L (ref 3.5–5.1)
Sodium: 136 mmol/L (ref 135–145)
Sodium: 133 mmol/L — ABNORMAL LOW (ref 135–145)
Sodium: 138 mmol/L (ref 135–145)

## 2023-09-06 LAB — GLUCOSE, CAPILLARY
Glucose-Capillary: 117 mg/dL — ABNORMAL HIGH (ref 70–99)
Glucose-Capillary: 128 mg/dL — ABNORMAL HIGH (ref 70–99)
Glucose-Capillary: 137 mg/dL — ABNORMAL HIGH (ref 70–99)
Glucose-Capillary: 150 mg/dL — ABNORMAL HIGH (ref 70–99)
Glucose-Capillary: 156 mg/dL — ABNORMAL HIGH (ref 70–99)
Glucose-Capillary: 162 mg/dL — ABNORMAL HIGH (ref 70–99)
Glucose-Capillary: 169 mg/dL — ABNORMAL HIGH (ref 70–99)
Glucose-Capillary: 183 mg/dL — ABNORMAL HIGH (ref 70–99)
Glucose-Capillary: 191 mg/dL — ABNORMAL HIGH (ref 70–99)
Glucose-Capillary: 193 mg/dL — ABNORMAL HIGH (ref 70–99)
Glucose-Capillary: 226 mg/dL — ABNORMAL HIGH (ref 70–99)
Glucose-Capillary: 230 mg/dL — ABNORMAL HIGH (ref 70–99)
Glucose-Capillary: 240 mg/dL — ABNORMAL HIGH (ref 70–99)

## 2023-09-06 LAB — HEPATIC FUNCTION PANEL
ALT: 14 U/L (ref 0–44)
AST: 14 U/L — ABNORMAL LOW (ref 15–41)
Albumin: 3.9 g/dL (ref 3.5–5.0)
Alkaline Phosphatase: 150 U/L — ABNORMAL HIGH (ref 38–126)
Bilirubin, Direct: 0.2 mg/dL (ref 0.0–0.2)
Indirect Bilirubin: 0.5 mg/dL (ref 0.3–0.9)
Total Bilirubin: 0.7 mg/dL (ref 0.3–1.2)
Total Protein: 7.6 g/dL (ref 6.5–8.1)

## 2023-09-06 LAB — CBC
HCT: 45 % (ref 36.0–46.0)
Hemoglobin: 14.8 g/dL (ref 12.0–15.0)
MCH: 27.1 pg (ref 26.0–34.0)
MCHC: 32.9 g/dL (ref 30.0–36.0)
MCV: 82.4 fL (ref 80.0–100.0)
Platelets: 204 10*3/uL (ref 150–400)
RBC: 5.46 MIL/uL — ABNORMAL HIGH (ref 3.87–5.11)
RDW: 14.1 % (ref 11.5–15.5)
WBC: 11.7 10*3/uL — ABNORMAL HIGH (ref 4.0–10.5)
nRBC: 0 % (ref 0.0–0.2)

## 2023-09-06 LAB — CBG MONITORING, ED
Glucose-Capillary: 254 mg/dL — ABNORMAL HIGH (ref 70–99)
Glucose-Capillary: 315 mg/dL — ABNORMAL HIGH (ref 70–99)
Glucose-Capillary: 392 mg/dL — ABNORMAL HIGH (ref 70–99)
Glucose-Capillary: 495 mg/dL — ABNORMAL HIGH (ref 70–99)
Glucose-Capillary: 506 mg/dL (ref 70–99)

## 2023-09-06 LAB — HIV ANTIBODY (ROUTINE TESTING W REFLEX): HIV Screen 4th Generation wRfx: NONREACTIVE

## 2023-09-06 LAB — LIPASE, BLOOD: Lipase: 23 U/L (ref 11–51)

## 2023-09-06 LAB — MAGNESIUM: Magnesium: 1.8 mg/dL (ref 1.7–2.4)

## 2023-09-06 LAB — GAMMA GT: GGT: 46 U/L (ref 7–50)

## 2023-09-06 LAB — MRSA NEXT GEN BY PCR, NASAL: MRSA by PCR Next Gen: NOT DETECTED

## 2023-09-06 MED ORDER — HYDROCHLOROTHIAZIDE 12.5 MG PO TABS
12.5000 mg | ORAL_TABLET | Freq: Every day | ORAL | Status: DC
  Administered 2023-09-06 – 2023-09-07 (×2): 12.5 mg via ORAL
  Filled 2023-09-06 (×2): qty 1

## 2023-09-06 MED ORDER — FAMOTIDINE 20 MG PO TABS
20.0000 mg | ORAL_TABLET | Freq: Two times a day (BID) | ORAL | Status: DC
  Administered 2023-09-06 – 2023-09-07 (×3): 20 mg via ORAL
  Filled 2023-09-06 (×2): qty 1

## 2023-09-06 MED ORDER — NICARDIPINE HCL IN NACL 20-0.86 MG/200ML-% IV SOLN
3.0000 mg/h | INTRAVENOUS | Status: DC
  Administered 2023-09-06 (×2): 5 mg/h via INTRAVENOUS
  Filled 2023-09-06: qty 200

## 2023-09-06 MED ORDER — ROSUVASTATIN CALCIUM 20 MG PO TABS
20.0000 mg | ORAL_TABLET | Freq: Every day | ORAL | Status: DC
  Administered 2023-09-06: 20 mg via ORAL
  Filled 2023-09-06: qty 1

## 2023-09-06 MED ORDER — INSULIN DETEMIR 100 UNIT/ML ~~LOC~~ SOLN
10.0000 [IU] | Freq: Two times a day (BID) | SUBCUTANEOUS | Status: DC
  Administered 2023-09-06 – 2023-09-07 (×3): 10 [IU] via SUBCUTANEOUS
  Filled 2023-09-06 (×4): qty 0.1

## 2023-09-06 MED ORDER — ONDANSETRON HCL 4 MG/2ML IJ SOLN: 4.0000 mg | Freq: Four times a day (QID) | INTRAMUSCULAR | Status: DC | PRN

## 2023-09-06 MED ORDER — IOHEXOL 350 MG/ML SOLN
75.0000 mL | Freq: Once | INTRAVENOUS | Status: AC | PRN
  Administered 2023-09-06: 75 mL via INTRAVENOUS

## 2023-09-06 MED ORDER — DOCUSATE SODIUM 100 MG PO CAPS: 100.0000 mg | ORAL_CAPSULE | Freq: Two times a day (BID) | ORAL | Status: DC | PRN

## 2023-09-06 MED ORDER — INSULIN ASPART 100 UNIT/ML IJ SOLN
2.0000 [IU] | INTRAMUSCULAR | Status: DC
  Administered 2023-09-06: 4 [IU] via SUBCUTANEOUS
  Administered 2023-09-06: 6 [IU] via SUBCUTANEOUS

## 2023-09-06 MED ORDER — LISINOPRIL 20 MG PO TABS
20.0000 mg | ORAL_TABLET | Freq: Every day | ORAL | Status: DC
  Administered 2023-09-06 – 2023-09-07 (×2): 20 mg via ORAL
  Filled 2023-09-06 (×2): qty 1

## 2023-09-06 MED ORDER — AMLODIPINE BESYLATE 10 MG PO TABS
10.0000 mg | ORAL_TABLET | Freq: Every day | ORAL | Status: DC
  Administered 2023-09-07: 10 mg via ORAL
  Filled 2023-09-06 (×2): qty 1

## 2023-09-06 MED ORDER — NICARDIPINE HCL IN NACL 20-0.86 MG/200ML-% IV SOLN: 5.0000 mg/h | Freq: Once | INTRAVENOUS | Status: DC

## 2023-09-06 MED ORDER — CHLORHEXIDINE GLUCONATE CLOTH 2 % EX PADS
6.0000 | MEDICATED_PAD | Freq: Every day | CUTANEOUS | Status: DC
  Administered 2023-09-06 (×2): 6 via TOPICAL

## 2023-09-06 MED ORDER — NICARDIPINE HCL IN NACL 20-0.86 MG/200ML-% IV SOLN: 3.0000 mg/h | Freq: Once | INTRAVENOUS | Status: DC

## 2023-09-06 MED ORDER — POLYETHYLENE GLYCOL 3350 17 G PO PACK: 17.0000 g | PACK | Freq: Every day | ORAL | Status: DC | PRN

## 2023-09-06 MED ORDER — LISINOPRIL-HYDROCHLOROTHIAZIDE 20-12.5 MG PO TABS: 1.0000 | ORAL_TABLET | Freq: Every day | ORAL | Status: DC

## 2023-09-06 MED ORDER — OXYCODONE-ACETAMINOPHEN 5-325 MG PO TABS
1.0000 | ORAL_TABLET | Freq: Four times a day (QID) | ORAL | Status: DC | PRN
  Administered 2023-09-06 (×2): 1 via ORAL
  Filled 2023-09-06 (×2): qty 1

## 2023-09-06 MED ORDER — HYDRALAZINE HCL 20 MG/ML IJ SOLN
10.0000 mg | INTRAMUSCULAR | Status: DC | PRN
  Administered 2023-09-06: 10 mg via INTRAVENOUS
  Filled 2023-09-06: qty 1

## 2023-09-06 MED ORDER — HEPARIN SODIUM (PORCINE) 5000 UNIT/ML IJ SOLN
5000.0000 [IU] | Freq: Three times a day (TID) | INTRAMUSCULAR | Status: DC
  Administered 2023-09-06 – 2023-09-07 (×3): 5000 [IU] via SUBCUTANEOUS
  Filled 2023-09-06 (×3): qty 1

## 2023-09-06 MED ORDER — PANTOPRAZOLE SODIUM 40 MG IV SOLR
40.0000 mg | Freq: Every day | INTRAVENOUS | Status: DC
  Administered 2023-09-06: 40 mg via INTRAVENOUS
  Filled 2023-09-06: qty 10

## 2023-09-06 MED ORDER — ATENOLOL 50 MG PO TABS: 50.0000 mg | ORAL_TABLET | Freq: Every day | ORAL | Status: DC

## 2023-09-06 MED ORDER — METOPROLOL TARTRATE 25 MG PO TABS
25.0000 mg | ORAL_TABLET | Freq: Two times a day (BID) | ORAL | Status: DC
  Administered 2023-09-06 – 2023-09-07 (×3): 25 mg via ORAL
  Filled 2023-09-06 (×3): qty 1

## 2023-09-06 NOTE — ED Notes (Signed)
Insulin dose changed to 10.5 per endotool.

## 2023-09-06 NOTE — Progress Notes (Signed)
Pt arrived to [id number], VSS. Pt belongings include 2 chains, a pair of earrings, and clothing.

## 2023-09-06 NOTE — H&P (Signed)
NAME:  Stephanie Thornton, MRN:  [id number], DOB:  [birth year], LOS: 0 ADMISSION DATE:  09/05/2023, CONSULTATION DATE:  09/06/23 REFERRING MD:  EDP, CHIEF COMPLAINT:  HHS and htn emergency   History of Present Illness:  52 yo female presented to [facility] with severe hyperglycemia without anion gap, aki and htn emergency req cardene infusion. Pt presented via EMS due to AMS and hyperglycemia . EMS reported increased urination and thirst as well as malaise. There are also reports that pt has not been adherent with her insulin. Last taking it Tuesday of this week (Saturday today).   When she presented she was noted to be hypertensive with ams, cth showed a hyperdense R proximal MCA area however thankfully cta was negative for LVO req emergent intervention. There are multiple areas of mild to moderate stenosis.   At the time of my exam she was a relatively poor historian with persistent encephalopathy. She is complaining of pain but otherwise denies vomiting/diarrhea/fever/chills. Endorses some nausea and that she has not been feeling well for past few days resulting in her not taking her medications.   Pertinent  Medical History  T2dm with hyperglycemia Cad s/p stent Htn Hyperlipidemia GERD Esophageal stricture Bipolar d/o H/o cva with residual L sided deficits   Significant Hospital Events: Including procedures, antibiotic start and stop dates in addition to other pertinent events   Admitted to ICU for insulin infusion and cardene 9/7 Cta 9/7: 1. Negative CTA for large vessel occlusion or other emergent finding. 2. Intracranial atherosclerotic disease. Associated moderate multifocal narrowing about the carotid siphons bilaterally, with mild to moderate bilateral PCA stenoses. 3. Mild atheromatous change about the carotid bifurcations without hemodynamically significant greater than 50% stenosis.  Interim History / Subjective:    Objective   Blood pressure (!) 200/113, pulse (!) 112,  temperature 98.2 F (36.8 C), temperature source Oral, resp. rate 20, height 5\' 2"  (1.575 m), weight 67 kg, SpO2 100%.        Intake/Output Summary (Last 24 hours) at 09/06/2023 0333 Last data filed at 09/06/2023 0215 Gross per 24 hour  Intake 1225.72 ml  Output --  Net 1225.72 ml   Filed Weights   09/05/23 2150  Weight: 67 kg    Examination: General: nad, sleeping appears comfortable but chronically ill HENT: ncat, L facial drop noticeable, mm dry and pale Lungs: ctab Cardiovascular: sinus tach Abdomen: soft, nt nd bs + Extremities: trace edema, no c/c Neuro: drowsy but arousable. Baseline L weakness GU: deferred  Resolved Hospital Problem list     Assessment & Plan:  HHS:  -cont per protocol with volume resuscitation and insulin infusion -A1c pending -transition to subcut insulin when able -check blood cx -hold on empiric abx at this time.   Htn emergency:  Aki: Ams with acute encephalopathy, poa: -requiring infusion of cardene -presented with BP >220, would not drop BP <170 with infusion for first 24 hours -assuming pt has been non adherent to anti-htn meds as well since she has not been taking insulin since Tuesday -resume PO meds when more alert -monitor I/o and indices -consider renal u/s if not improving  Elevated alk phos:  -appears to be a relatively chronic issue as has been mentioned previously -check ggt   Bipolar History of cva Hyperlipidemia -noted and resume home meds when able to take po  Gerd: -pt has ppi and h2 blocker listed at home.  -for now will place iv ppi until can take po    Best Practice (right  click and "Reselect all SmartList Selections" daily)   Diet/type: NPO DVT prophylaxis: prophylactic heparin  GI prophylaxis: PPI Lines: N/A Foley:  N/A Code Status:  full code Last date of multidisciplinary goals of care discussion [pending d/w family]  Labs   CBC: Recent Labs  Lab 09/05/23 2159 09/05/23 2206  WBC 10.4  --    NEUTROABS 7.5  --   HGB 17.0* 18.7*  HCT 51.3* 55.0*  MCV 84.4  --   PLT 216  --     Basic Metabolic Panel: Recent Labs  Lab 09/05/23 2206 09/05/23 2314 09/06/23 0216  NA 131* 136 133*  K 5.2* 3.9 3.5  CL  --  95* 99  CO2  --  21* 19*  GLUCOSE  --  654* 354*  BUN  --  16 13  CREATININE  --  1.03* 0.96  CALCIUM  --  10.6* 9.8   GFR: Estimated Creatinine Clearance: 62.3 mL/min (by C-G formula based on SCr of 0.96 mg/dL). Recent Labs  Lab 09/05/23 2159  WBC 10.4    Liver Function Tests: Recent Labs  Lab 09/05/23 2314  AST 14*  ALT 14  ALKPHOS 150*  BILITOT 0.7  PROT 7.6  ALBUMIN 3.9   Recent Labs  Lab 09/05/23 2314  LIPASE 23   No results for input(s): "AMMONIA" in the last 168 hours.  ABG    Component Value Date/Time   PHART 7.415 05/11/2022 1647   PCO2ART 32.1 05/11/2022 1647   PO2ART 82 (L) 05/11/2022 1647   HCO3 21.4 09/05/2023 2206   TCO2 22 09/05/2023 2206   ACIDBASEDEF 3.0 (H) 05/11/2022 1647   O2SAT 93 09/05/2023 2206     Coagulation Profile: No results for input(s): "INR", "PROTIME" in the last 168 hours.  Cardiac Enzymes: No results for input(s): "CKTOTAL", "CKMB", "CKMBINDEX", "TROPONINI" in the last 168 hours.  HbA1C: Hemoglobin A1C  Date/Time Value Ref Range Status  03/18/2019 09:19 AM 12.9 (A) 4.0 - 5.6 % Final  12/15/2018 10:18 AM 12.7 (A) 4.0 - 5.6 % Final   Hgb A1c MFr Bld  Date/Time Value Ref Range Status  05/07/2022 02:23 AM 9.4 (H) 4.8 - 5.6 % Final    Comment:    (NOTE) Pre diabetes:          5.7%-6.4%  Diabetes:              >6.4%  Glycemic control for   <7.0% adults with diabetes   05/16/2018 09:17 AM 12.3 (H) 4.8 - 5.6 % Final    Comment:    (NOTE) Pre diabetes:          5.7%-6.4% Diabetes:              >6.4% Glycemic control for   <7.0% adults with diabetes     CBG: Recent Labs  Lab 09/05/23 2326 09/06/23 0008 09/06/23 0040 09/06/23 0112 09/06/23 0214  GLUCAP 551* 495* 506* 392* 254*     Review of Systems:   As per HPI  Past Medical History:  She,  has a past medical history of Anxiety, Arthritis, Benign essential HTN (05/01/2015), Bipolar disorder (HCC) (diagnosed early 90s), CAD in native artery, Chronic bronchitis (HCC), Chronic lower back pain, Depression, Diabetes mellitus type II (2010), GERD (gastroesophageal reflux disease), Headache, History of hiatal hernia, History of stomach ulcers, HLD (hyperlipidemia), Menopause, Migraine, NSTEMI (non-ST elevated myocardial infarction) (HCC) (04/2022), OSA on CPAP, and Stroke (HCC).   Surgical History:   Past Surgical History:  Procedure Laterality Date  ABDOMINAL HYSTERECTOMY  2007   BUNIONECTOMY Bilateral    CARPAL TUNNEL RELEASE Right    CORONARY STENT INTERVENTION N/A 05/07/2022   Procedure: CORONARY STENT INTERVENTION;  Surgeon: Lyn Records, MD;  Location: [facility] INVASIVE CV LAB;  Service: Cardiovascular;  Laterality: N/A;   HAMMER TOE SURGERY Left    LEFT HEART CATH AND CORONARY ANGIOGRAPHY N/A 05/20/2018   Procedure: LEFT HEART CATH AND CORONARY ANGIOGRAPHY;  Surgeon: Marykay Lex, MD;  Location[facility] INVASIVE CV LAB;  Service: Cardiovascular;  Laterality: N/A;   LEFT HEART CATH AND CORONARY ANGIOGRAPHY N/A 05/07/2022   Procedure: LEFT HEART CATH AND CORONARY ANGIOGRAPHY;  Surgeon: Lyn Records, MD;  Location: [facility] INVASIVE CV LAB;  Service: Cardiovascular;  Laterality: N/A;   TONSILLECTOMY Bilateral 05/24/2015   Procedure: TONSILLECTOMY;  Surgeon: Christia Reading, MD;  Location: [facility];  Service: ENT;  Laterality: Bilateral;     Social History:   reports that she quit smoking about a year ago. Her smoking use included cigarettes. She started smoking about 30 years ago. She has a 30 pack-year smoking history. She has never used smokeless tobacco. She reports that she does not drink alcohol and does not use drugs.   Family History:  Her family history includes Bipolar disorder in her mother and son; CVA in her mother;  Depression in her brother; Heart attack in her maternal aunt; Heart disease in her maternal aunt; Hypertension in her brother and sister.   Allergies Allergies  Allergen Reactions   Lyrica [Pregabalin] Shortness Of Breath, Itching and Rash   Rocephin [Ceftriaxone Sodium In Dextrose] Shortness Of Breath   Sulfa Antibiotics Hives, Shortness Of Breath and Itching   Ultram [Tramadol] Hives and Shortness Of Breath    Had asthma attack   Vancomycin Anaphylaxis    Per Dr. Flonnie Overman   Glucotrol [Glipizide] Hives   Keflex [Cephalexin] Hives   Penicillins Hives    Has patient had a PCN reaction causing immediate rash, facial/tongue/throat swelling, SOB or lightheadedness with hypotension: YES Has patient had a PCN reaction causing severe rash involving mucus membranes or skin necrosis: NO Has patient had a PCN reaction that required hospitalization: YES Has patient had a PCN reaction occurring within the last 10 years: YES If all of the above answers are "NO", then may proceed with Cephalosporin use.   Janumet [Sitagliptin-Metformin Hcl] Nausea Only     Home Medications  Prior to Admission medications   Medication Sig Start Date End Date Taking? Authorizing Provider  Acetaminophen (TYLENOL PO) Take 2 tablets by mouth 3 (three) times daily as needed (tooth ache).    [provider]  albuterol (PROVENTIL HFA;VENTOLIN HFA) 108 (90 Base) MCG/ACT inhaler Inhale 1-2 puffs into the lungs every 6 (six) hours as needed for wheezing or shortness of breath. Patient taking differently: Inhale 2 puffs into the lungs as needed for shortness of breath. 03/04/19   Grayce Sessions, NP  alum & mag hydroxide-simeth (MAALOX MAX) 400-400-40 MG/5ML suspension Take 15 mLs by mouth every 6 (six) hours as needed for indigestion. 04/30/23   Elayne Snare K, DO  amitriptyline (ELAVIL) 150 MG tablet Take 150 mg by mouth at bedtime. 08/06/22   [provider]  amLODipine (NORVASC) 10 MG tablet Take 1 tablet  (10 mg total) by mouth daily. 09/23/22   Quintella Reichert, MD  aspirin 81 MG chewable tablet Chew 1 tablet (81 mg total) by mouth daily. 05/09/22   Cyndi Bender, NP  azithromycin (ZITHROMAX Z-PAK) 250 MG  tablet Take 2 tablets on day 1, then take 1 tablet daily for 4 days 10/18/22     baclofen (LIORESAL) 10 MG tablet Take 10 mg by mouth 3 (three) times daily. 05/05/22   [provider]  busPIRone (BUSPAR) 10 MG tablet Take 10 mg by mouth 3 (three) times daily. 01/25/19   [provider]  Cholecalciferol (DIALYVITE VITAMIN D3 MAX) 1.25 MG (50000 UT) TABS Take 1 tablet by mouth once a week. 10/08/22     Cholecalciferol (VITAMIN D3) 1.25 MG (50000 UT) CAPS Take 1 capsule by mouth once a week. 08/08/22   [provider]  Cholecalciferol 1.25 MG (50000 UT) capsule Take 1 capsule by mouth once a week. 11/11/22     famotidine (PEPCID) 20 MG tablet Take 1 tablet (20 mg total) by mouth 2 (two) times daily. 04/30/23   Elayne Snare K, DO  furosemide (LASIX) 20 MG tablet Take 2 tablets by mouth daily X's 3 days then reduce to 1 tablet by mouth daily. Patient taking differently: Take 20 mg by mouth daily. 05/29/22   Tereso Newcomer T, PA-C  gabapentin (NEURONTIN) 300 MG capsule Take 1 capsule (300 mg total) by mouth 3 (three) times daily. 06/29/19   Grayce Sessions, NP  gabapentin (NEURONTIN) 300 MG capsule Take 1 capsule (300 mg total) by mouth 3 (three) times daily. 10/08/22     gabapentin (NEURONTIN) 300 MG capsule Take 1 capsule (300 mg total) by mouth 3 (three) times daily. 11/11/22     LATUDA 80 MG TABS tablet Take 80 mg by mouth every evening. 02/01/19   [provider]  metoprolol tartrate (LOPRESSOR) 25 MG tablet Take 1 tablet (25 mg total) by mouth 2 (two) times daily. 09/23/22   Quintella Reichert, MD  nitrofurantoin, macrocrystal-monohydrate, (MACROBID) 100 MG capsule Take 1 capsule (100 mg total) by mouth 2 (two) times daily. 08/06/23   Renne Crigler, PA-C  nitroGLYCERIN  (NITROSTAT) 0.4 MG SL tablet Place 1 tablet (0.4 mg total) under the tongue every 5 (five) minutes x 3 doses as needed for chest pain. 05/09/22   Cyndi Bender, NP  NOVOLOG FLEXPEN 100 UNIT/ML FlexPen Inject 10-15 Units into the skin 3 (three) times daily as needed for high blood sugar. Sliding Scale 08/12/22   [provider]  Oxycodone HCl 10 MG TABS Take 10 mg by mouth 4 (four) times daily as needed (pain). 07/08/22   [provider]  Oxycodone HCl 10 MG TABS Take 1 tablet (10 mg total) by mouth 3 (three) times daily as needed. 11/11/22     pantoprazole (PROTONIX) 40 MG tablet Take 1 tablet (40 mg total) by mouth daily. 10/30/22     Potassium Chloride 10 MEQ PACK Take 10 mEq by mouth daily for 7 days. 04/30/23 05/07/23  Elayne Snare K, DO  potassium chloride SA (KLOR-CON M) 20 MEQ tablet Take one tablet twice a day for five days, then take  one tablet every day. 07/17/22   Dione Booze, MD  rosuvastatin (CRESTOR) 10 MG tablet Take 10 mg by mouth at bedtime. 08/10/22   [provider]  ticagrelor (BRILINTA) 90 MG TABS tablet Take 1 tablet (90 mg total) by mouth 2 (two) times daily. Patient not taking: Reported on 07/08/2022 05/09/22   Cyndi Bender, NP  topiramate (TOPAMAX) 200 MG tablet Take 200 mg by mouth at bedtime. 01/28/19   [provider]  traZODone (DESYREL) 100 MG tablet Take 200 mg by mouth at bedtime. 02/24/19   [provider]  TRESIBA FLEXTOUCH 100 UNIT/ML FlexTouch Pen Inject 12 Units into the skin at bedtime. 07/12/22   [provider]     Critical care time: 

## 2023-09-06 NOTE — ED Notes (Signed)
Unsuccessful venipuncture

## 2023-09-06 NOTE — Progress Notes (Addendum)
eLink Physician-Brief Progress Note Patient Name: Beverley Bynum DOB: 1971/02/29 MRN: [id number]   Date of Service  09/06/2023  HPI/Events of Note  52 year old female with a history of type 2 diabetes mellitus, coronary artery disease status post PCI, reflux complicated by esophageal stricture and a history of CVA with left-sided deficits who presented to the emergency department for encephalopathy and hyperglycemia consistent with HHS in the setting of hypertensive emergency and acute kidney injury.  She presents with tachycardia, hypertension into the 230s saturating 96% on room air.  She was treated with nicardipine and insulin drips.  Metabolic panel with mild hypokalemia, hyperglycemia, none anion gap metabolic acidosis and blood counts show elevated hemoglobin.  CT head and angiography unremarkable.  Chest radiograph unremarkable.  Woke up complaining of significant pain/headache still somewhat disoriented but takes Oxy 10 every 4 hours at home.  eICU Interventions  Maintain nicardipine with 25% drop within the first 6 hours and normalize pressures within 24 hours.  Suspect opiates might be contributing to her encephalopathy, but as a chronic opiate user, will attempt 5 mg of oxycodone every 6 hours as needed  Maintain insulin drip with Endo tool HHS, maintain IV fluids  BMP 4 hours, aggressive electrolyte replacement.  DVT prophylaxis with heparin Resume home pantoprazole for GI prophylaxis.   4098 -adding on amlodipine for this morning this is a home med that she has not filled for almost a year.  LV and RV function is preserved in 2023.  Add on as needed hydralazine.  Currently not requiring any nicardipine infusion.  Intervention Category Evaluation Type: New Patient Evaluation  Jayven Naill 09/06/2023, 4:19 AM

## 2023-09-06 NOTE — Progress Notes (Signed)
NAME:  Stephanie Thornton, MRN:  [id number], DOB:  01-10-1971, LOS: 0 ADMISSION DATE:  09/05/2023, CONSULTATION DATE:  09/06/23 REFERRING MD:  EDP, CHIEF COMPLAINT:  HHS and htn emergency   History of Present Illness:  52 yo female presented to [facility], [place] with severe hyperglycemia without anion gap, aki and htn emergency req cardene infusion. Pt presented via EMS due to AMS and hyperglycemia . EMS reported increased urination and thirst as well as malaise. There are also reports that pt has not been adherent with her insulin. Last taking it Tuesday of this week (Saturday today).   When she presented she was noted to be hypertensive with ams, cth showed a hyperdense R proximal MCA area however thankfully cta was negative for LVO req emergent intervention. There are multiple areas of mild to moderate stenosis.   At the time of my exam she was a relatively poor historian with persistent encephalopathy. She is complaining of pain but otherwise denies vomiting/diarrhea/fever/chills. Endorses some nausea and that she has not been feeling well for past few days resulting in her not taking her medications.   Pertinent  Medical History  T2dm with hyperglycemia Cad s/p stent Htn Hyperlipidemia GERD Esophageal stricture Bipolar d/o H/o cva with residual L sided deficits   Significant Hospital Events: Including procedures, antibiotic start and stop dates in addition to other pertinent events   Admitted to ICU for insulin infusion and cardene 9/7 Cta 9/7: 1. Negative CTA for large vessel occlusion or other emergent finding. 2. Intracranial atherosclerotic disease. Associated moderate multifocal narrowing about the carotid siphons bilaterally, with mild to moderate bilateral PCA stenoses. 3. Mild atheromatous change about the carotid bifurcations without hemodynamically significant greater than 50% stenosis.  Interim History / Subjective:  Nicardipine is off Insulin 1.8 and CBG has reached goal I/O+ 1.1  L total    Objective   Blood pressure (!) 169/106, pulse 97, temperature 98.5 F (36.9 C), temperature source Oral, resp. rate 15, height 5\' 2"  (1.575 m), weight 57.5 kg, SpO2 94%.        Intake/Output Summary (Last 24 hours) at 09/06/2023 1006 Last data filed at 09/06/2023 0900 Gross per 24 hour  Intake 1861.35 ml  Output 700 ml  Net 1161.35 ml   Filed Weights   09/05/23 2150 09/06/23 0415  Weight: 67 kg 57.5 kg    Examination: General: Comfortable, stable, no acute HENT: Oropharynx clear, Lungs: Clear bilaterally Cardiovascular: Regular, no murmur Abdomen: Nondistended with positive bowel sounds Extremities: No edema Neuro: Awake, alert, some baseline left-sided weakness.  Interacts appropriately, answers all questions  Resolved Hospital Problem list     Assessment & Plan:  HHS:  -Insulin drip has been weaned, currently 1.8 and the meets criteria for transition off.  Start Levemir 10 units twice daily and turn insulin off in 2 hours -Sliding Scale insulin -Hemoglobin A1c is pending -Question whether precipitated by inability to obtain her insulin, she was having viral symptoms prior to this admission.  No clear evidence for bacterial infection.  Blood cultures are pending.  Htn emergency:  Aki: Ams with acute encephalopathy, poa: -Nicardipine has been weaned off -Goal SBP 170 until 9/8, then decrease -She states that she has had access to her antihypertensives, has been taking.  I will restart her metoprolol and Zestoretic.  Continue her amlodipine -Holding her home Lasix for now -Follow BMP, urine output.  Both improved  Elevated alk phos:  -appears to be a relatively chronic issue as has been mentioned previously -GGT pending  Bipolar History of cva Hyperlipidemia -Restart statin 9/7 -She reports that she has not been taking her home Brilinta  Gerd: -Restart Pepcid 20 mg twice daily    Best Practice (right click and "Reselect all SmartList  Selections" daily)   Diet/type: Regular consistency (see orders) DVT prophylaxis: prophylactic heparin  GI prophylaxis: PPI Lines: N/A Foley:  N/A Code Status:  full code Last date of multidisciplinary goals of care discussion [pending d/w family]  Labs   CBC: Recent Labs  Lab 09/05/23 2159 09/05/23 2206  WBC 10.4  --   NEUTROABS 7.5  --   HGB 17.0* 18.7*  HCT 51.3* 55.0*  MCV 84.4  --   PLT 216  --     Basic Metabolic Panel: Recent Labs  Lab 09/05/23 2206 09/05/23 2314 09/06/23 0216  NA 131* 136 133*  K 5.2* 3.9 3.5  CL  --  95* 99  CO2  --  21* 19*  GLUCOSE  --  654* 354*  BUN  --  16 13  CREATININE  --  1.03* 0.96  CALCIUM  --  10.6* 9.8   GFR: Estimated Creatinine Clearance: 54.8 mL/min (by C-G formula based on SCr of 0.96 mg/dL). Recent Labs  Lab 09/05/23 2159  WBC 10.4    Liver Function Tests: Recent Labs  Lab 09/05/23 2314  AST 14*  ALT 14  ALKPHOS 150*  BILITOT 0.7  PROT 7.6  ALBUMIN 3.9   Recent Labs  Lab 09/05/23 2314  LIPASE 23   No results for input(s): "AMMONIA" in the last 168 hours.  ABG    Component Value Date/Time   PHART 7.415 05/11/2022 1647   PCO2ART 32.1 05/11/2022 1647   PO2ART 82 (L) 05/11/2022 1647   HCO3 21.4 09/05/2023 2206   TCO2 22 09/05/2023 2206   ACIDBASEDEF 3.0 (H) 05/11/2022 1647   O2SAT 93 09/05/2023 2206     Coagulation Profile: No results for input(s): "INR", "PROTIME" in the last 168 hours.  Cardiac Enzymes: No results for input(s): "CKTOTAL", "CKMB", "CKMBINDEX", "TROPONINI" in the last 168 hours.  HbA1C: Hemoglobin A1C  Date/Time Value Ref Range Status  03/18/2019 09:19 AM 12.9 (A) 4.0 - 5.6 % Final  12/15/2018 10:18 AM 12.7 (A) 4.0 - 5.6 % Final   Hgb A1c MFr Bld  Date/Time Value Ref Range Status  05/07/2022 02:23 AM 9.4 (H) 4.8 - 5.6 % Final    Comment:    (NOTE) Pre diabetes:          5.7%-6.4%  Diabetes:              >6.4%  Glycemic control for   <7.0% adults with diabetes    05/16/2018 09:17 AM 12.3 (H) 4.8 - 5.6 % Final    Comment:    (NOTE) Pre diabetes:          5.7%-6.4% Diabetes:              >6.4% Glycemic control for   <7.0% adults with diabetes     CBG: Recent Labs  Lab 09/06/23 0414 09/06/23 0519 09/06/23 0625 09/06/23 0737 09/06/23 0843  GLUCAP 240* 226* 169* 193* 183*    Review of Systems:   As per HPI  Past Medical History:  She,  has a past medical history of Anxiety, Arthritis, Benign essential HTN (05/01/2015), Bipolar disorder (HCC) (diagnosed early 90s), CAD in native artery, Chronic bronchitis (HCC), Chronic lower back pain, Depression, Diabetes mellitus type II (2010), GERD (gastroesophageal reflux disease), Headache, History of hiatal hernia,  History of stomach ulcers, HLD (hyperlipidemia), Menopause, Migraine, NSTEMI (non-ST elevated myocardial infarction) (HCC) (04/2022), OSA on CPAP, and Stroke (HCC).   Surgical History:   Past Surgical History:  Procedure Laterality Date   ABDOMINAL HYSTERECTOMY  2007   BUNIONECTOMY Bilateral    CARPAL TUNNEL RELEASE Right    CORONARY STENT INTERVENTION N/A 05/07/2022   Procedure: CORONARY STENT INTERVENTION;  Surgeon: Lyn Records, MD;  Location: [facility] INVASIVE CV LAB;  Service: Cardiovascular;  Laterality: N/A;   HAMMER TOE SURGERY Left    LEFT HEART CATH AND CORONARY ANGIOGRAPHY N/A 05/20/2018   Procedure: LEFT HEART CATH AND CORONARY ANGIOGRAPHY;  Surgeon: Marykay Lex, MD;  [facility] INVASIVE CV LAB;  Service: Cardiovascular;  Laterality: N/A;   LEFT HEART CATH AND CORONARY ANGIOGRAPHY N/A 05/07/2022   Procedure: LEFT HEART CATH AND CORONARY ANGIOGRAPHY;  Surgeon: Lyn Records, MD;  Location: [facility] INVASIVE CV LAB;  Service: Cardiovascular;  Laterality: N/A;   TONSILLECTOMY Bilateral 05/24/2015   Procedure: TONSILLECTOMY;  Surgeon: Christia Reading, MD;  Location: [facility] OR;  Service: ENT;  Laterality: Bilateral;     Social History:   reports that she quit smoking about a year ago. Her  smoking use included cigarettes. She started smoking about 30 years ago. She has a 30 pack-year smoking history. She has never used smokeless tobacco. She reports that she does not drink alcohol and does not use drugs.   Family History:  Her family history includes Bipolar disorder in her mother and son; CVA in her mother; Depression in her brother; Heart attack in her maternal aunt; Heart disease in her maternal aunt; Hypertension in her brother and sister.   Allergies Allergies  Allergen Reactions   Lyrica [Pregabalin] Shortness Of Breath, Itching and Rash   Rocephin [Ceftriaxone Sodium In Dextrose] Shortness Of Breath   Sulfa Antibiotics Hives, Shortness Of Breath and Itching   Ultram [Tramadol] Hives and Shortness Of Breath    Had asthma attack   Vancomycin Anaphylaxis    Per Dr. Flonnie Overman   Glucotrol [Glipizide] Hives   Keflex [Cephalexin] Hives   Penicillins Hives    Has patient had a PCN reaction causing immediate rash, facial/tongue/throat swelling, SOB or lightheadedness with hypotension: YES Has patient had a PCN reaction causing severe rash involving mucus membranes or skin necrosis: NO Has patient had a PCN reaction that required hospitalization: YES Has patient had a PCN reaction occurring within the last 10 years: YES If all of the above answers are "NO", then may proceed with Cephalosporin use.   Janumet [Sitagliptin-Metformin Hcl] Nausea Only     Home Medications  Prior to Admission medications   Medication Sig Start Date End Date Taking? Authorizing Provider  Acetaminophen (TYLENOL PO) Take 2 tablets by mouth 3 (three) times daily as needed (tooth ache).    [provider]  albuterol (PROVENTIL HFA;VENTOLIN HFA) 108 (90 Base) MCG/ACT inhaler Inhale 1-2 puffs into the lungs every 6 (six) hours as needed for wheezing or shortness of breath. Patient taking differently: Inhale 2 puffs into the lungs as needed for shortness of breath. 03/04/19   Grayce Sessions, NP   alum & mag hydroxide-simeth (MAALOX MAX) 400-400-40 MG/5ML suspension Take 15 mLs by mouth every 6 (six) hours as needed for indigestion. 04/30/23   Elayne Snare K, DO  amitriptyline (ELAVIL) 150 MG tablet Take 150 mg by mouth at bedtime. 08/06/22   [provider]  amLODipine (NORVASC) 10 MG tablet Take 1 tablet (10 mg total) by  mouth daily. 09/23/22   Quintella Reichert, MD  aspirin 81 MG chewable tablet Chew 1 tablet (81 mg total) by mouth daily. 05/09/22   Cyndi Bender, NP  azithromycin (ZITHROMAX Z-PAK) 250 MG tablet Take 2 tablets on day 1, then take 1 tablet daily for 4 days 10/18/22     baclofen (LIORESAL) 10 MG tablet Take 10 mg by mouth 3 (three) times daily. 05/05/22   [provider]  busPIRone (BUSPAR) 10 MG tablet Take 10 mg by mouth 3 (three) times daily. 01/25/19   [provider]  Cholecalciferol (DIALYVITE VITAMIN D3 MAX) 1.25 MG (50000 UT) TABS Take 1 tablet by mouth once a week. 10/08/22     Cholecalciferol (VITAMIN D3) 1.25 MG (50000 UT) CAPS Take 1 capsule by mouth once a week. 08/08/22   [provider]  Cholecalciferol 1.25 MG (50000 UT) capsule Take 1 capsule by mouth once a week. 11/11/22     famotidine (PEPCID) 20 MG tablet Take 1 tablet (20 mg total) by mouth 2 (two) times daily. 04/30/23   Elayne Snare K, DO  furosemide (LASIX) 20 MG tablet Take 2 tablets by mouth daily X's 3 days then reduce to 1 tablet by mouth daily. Patient taking differently: Take 20 mg by mouth daily. 05/29/22   Tereso Newcomer T, PA-C  gabapentin (NEURONTIN) 300 MG capsule Take 1 capsule (300 mg total) by mouth 3 (three) times daily. 06/29/19   Grayce Sessions, NP  gabapentin (NEURONTIN) 300 MG capsule Take 1 capsule (300 mg total) by mouth 3 (three) times daily. 10/08/22     gabapentin (NEURONTIN) 300 MG capsule Take 1 capsule (300 mg total) by mouth 3 (three) times daily. 11/11/22     LATUDA 80 MG TABS tablet Take 80 mg by mouth every evening. 02/01/19   [provider]  metoprolol tartrate (LOPRESSOR) 25 MG tablet Take 1 tablet (25 mg total) by mouth 2 (two) times daily. 09/23/22   Quintella Reichert, MD  nitrofurantoin, macrocrystal-monohydrate, (MACROBID) 100 MG capsule Take 1 capsule (100 mg total) by mouth 2 (two) times daily. 08/06/23   Renne Crigler, PA-C  nitroGLYCERIN (NITROSTAT) 0.4 MG SL tablet Place 1 tablet (0.4 mg total) under the tongue every 5 (five) minutes x 3 doses as needed for chest pain. 05/09/22   Cyndi Bender, NP  NOVOLOG FLEXPEN 100 UNIT/ML FlexPen Inject 10-15 Units into the skin 3 (three) times daily as needed for high blood sugar. Sliding Scale 08/12/22   [provider]  Oxycodone HCl 10 MG TABS Take 10 mg by mouth 4 (four) times daily as needed (pain). 07/08/22   [provider]  Oxycodone HCl 10 MG TABS Take 1 tablet (10 mg total) by mouth 3 (three) times daily as needed. 11/11/22     pantoprazole (PROTONIX) 40 MG tablet Take 1 tablet (40 mg total) by mouth daily. 10/30/22     Potassium Chloride 10 MEQ PACK Take 10 mEq by mouth daily for 7 days. 04/30/23 05/07/23  Elayne Snare K, DO  potassium chloride SA (KLOR-CON M) 20 MEQ tablet Take one tablet twice a day for five days, then take  one tablet every day. 07/17/22   Dione Booze, MD  rosuvastatin (CRESTOR) 10 MG tablet Take 10 mg by mouth at bedtime. 08/10/22   [provider]  ticagrelor (BRILINTA) 90 MG TABS tablet Take 1 tablet (90 mg total) by mouth 2 (two) times daily. Patient not taking: Reported on 07/08/2022 05/09/22   Cyndi Bender, NP  topiramate (TOPAMAX) 200 MG tablet Take 200 mg by mouth at bedtime. 01/28/19   [provider]  traZODone (DESYREL) 100 MG tablet Take 200 mg by mouth at bedtime. 02/24/19   [provider]  TRESIBA FLEXTOUCH 100 UNIT/ML FlexTouch Pen Inject 12 Units into the skin at bedtime. 07/12/22   [provider]     Critical care time: 32 min    Levy Pupa, MD, PhD 09/06/2023, 10:06 AM [place]  [facility] [phone] or if no answer before 7:00PM call [phone] For any issues after 7:00PM please call eLink [phone]

## 2023-09-06 NOTE — Progress Notes (Signed)
Elevated Bps noticed and MD notified and home meds restated

## 2023-09-06 NOTE — ED Provider Notes (Signed)
I assumed care at signout from Dr. Estanislado Pandy, please see his note for full history and physical  Patient presented for altered mental status and hyperglycemia.  This is been ongoing for several days.  Patient was found to have significant hyperglycemia with anion gap that is now improving Initial concerns for hyperdense MCA sign on CT head, but preliminary read for CT angio was negative for LVO On my exam patient is somnolent but arousable, with no focal neurodeficits.  She is still somewhat confused.  Patient still persistently hypertensive and has received hydralazine.  Underlying hypertensive emergency/pres is possible I have consulted pharmacy who recommends nicardipine Have consulted critical care who will evaluate for admission   EKG Interpretation Date/Time:  Friday September 05 2023 22:14:34 EDT Ventricular Rate:  120 PR Interval:  183 QRS Duration:  91 QT Interval:  299 QTC Calculation: 423 R Axis:   -54  Text Interpretation: Sinus tachycardia LAE, consider biatrial enlargement Left anterior fascicular block Nonspecific T abnormalities, lateral leads Confirmed by Zadie Rhine ([id number]) on 09/06/2023 12:07:32 AM         .Critical Care E&M  Performed by: Zadie Rhine, MD Critical care provider statement:    Critical care time (minutes):  35   Critical care start time:  09/06/2023 2:30 AM   Critical care end time:  09/06/2023 3:05 AM   Critical care time was exclusive of:  Separately billable procedures and treating other patients   Critical care was necessary to treat or prevent imminent or life-threatening deterioration of the following conditions:  CNS failure or compromise, dehydration, metabolic crisis and endocrine crisis   Critical care was time spent personally by me on the following activities:  Examination of patient, discussions with consultants, re-evaluation of patient's condition and ordering and review of laboratory studies   I assumed direction of critical  care for this patient from another provider in my specialty: yes     Care discussed with: admitting provider   After initial E/M assessment, critical care services were subsequently performed that were exclusive of separately billable procedures or treatment.       Zadie Rhine, MD 09/06/23 [phone]

## 2023-09-07 DIAGNOSIS — Z91199 Patient's noncompliance with other medical treatment and regimen due to unspecified reason: Secondary | ICD-10-CM

## 2023-09-07 DIAGNOSIS — K219 Gastro-esophageal reflux disease without esophagitis: Secondary | ICD-10-CM | POA: Insufficient documentation

## 2023-09-07 DIAGNOSIS — E1101 Type 2 diabetes mellitus with hyperosmolarity with coma: Secondary | ICD-10-CM | POA: Diagnosis not present

## 2023-09-07 DIAGNOSIS — G9341 Metabolic encephalopathy: Secondary | ICD-10-CM | POA: Insufficient documentation

## 2023-09-07 LAB — GLUCOSE, CAPILLARY
Glucose-Capillary: 106 mg/dL — ABNORMAL HIGH (ref 70–99)
Glucose-Capillary: 111 mg/dL — ABNORMAL HIGH (ref 70–99)
Glucose-Capillary: 154 mg/dL — ABNORMAL HIGH (ref 70–99)

## 2023-09-07 MED ORDER — INSULIN ASPART 100 UNIT/ML IJ SOLN: 0.0000 [IU] | Freq: Three times a day (TID) | INTRAMUSCULAR | Status: DC

## 2023-09-07 MED ORDER — INSULIN PEN NEEDLE 31G X 5 MM MISC: 1.0000 [IU] | Freq: Four times a day (QID) | 1 refills | Status: AC

## 2023-09-07 MED ORDER — TRESIBA FLEXTOUCH 100 UNIT/ML ~~LOC~~ SOPN: 12.0000 [IU] | PEN_INJECTOR | Freq: Every day | SUBCUTANEOUS | 0 refills | Status: DC

## 2023-09-07 MED ORDER — NOVOLOG FLEXPEN 100 UNIT/ML ~~LOC~~ SOPN: 10.0000 [IU] | PEN_INJECTOR | Freq: Three times a day (TID) | SUBCUTANEOUS | 11 refills | Status: DC | PRN

## 2023-09-07 NOTE — Progress Notes (Signed)
Pt stable for transfer and vitals w/in order set. D/C paperwork given to pt and education provided by [facility]

## 2023-09-07 NOTE — Discharge Summary (Signed)
Physician Discharge Summary  Stephanie Thornton GNF:[id number] DOB: May 22, 1971 DOA: 09/05/2023  PCP: Salli Real, MD  Admit date: 09/05/2023 Discharge date: 09/07/2023  Admitted From: Home Disposition: Home  Recommendations for Outpatient Follow-up:  Follow up with PCP in 1 week  Discharge Condition: Stable CODE STATUS: Full code Diet recommendation: Carb modified  Brief/Interim Summary: From H&P by PCCM: "52 yo female presented to [facility] with severe hyperglycemia without anion gap, aki and htn emergency req cardene infusion. Pt presented via EMS due to AMS and hyperglycemia . EMS reported increased urination and thirst as well as malaise. There are also reports that pt has not been adherent with her insulin. Last taking it Tuesday of this week (Saturday today).    When she presented she was noted to be hypertensive with ams, cth showed a hyperdense R proximal MCA area however thankfully cta was negative for LVO req emergent intervention. There are multiple areas of mild to moderate stenosis.    At the time of my exam she was a relatively poor historian with persistent encephalopathy. She is complaining of pain but otherwise denies vomiting/diarrhea/fever/chills. Endorses some nausea and that she has not been feeling well for past few days resulting in her not taking her medications. "  Patient was admitted to ICU and started on insulin drip and Cardene drip.  Blood pressure and blood sugar improved and she was transition to subcutaneous insulin as well as off of Cardene drip, back on home antihypertensive medications.  Morning vital signs reveal blood pressure 130/80, blood sugar check 111.   Discharge Diagnoses:   Principal Problem:   HHNC (hyperglycemic hyperosmolar nonketotic coma) (HCC) Active Problems:   AKI (acute kidney injury) (HCC)   Essential hypertension   HLD (hyperlipidemia)   Bipolar disorder (HCC)   Hypertensive emergency   Uncontrolled type 2 diabetes mellitus with  hyperglycemia (HCC)   Acute metabolic encephalopathy   Medically noncompliant   GERD (gastroesophageal reflux disease)   Discharge Instructions  Discharge Instructions     Call MD for:  difficulty breathing, headache or visual disturbances   Complete by: As directed    Call MD for:  extreme fatigue   Complete by: As directed    Call MD for:  persistant dizziness or light-headedness   Complete by: As directed    Call MD for:  persistant nausea and vomiting   Complete by: As directed    Call MD for:  severe uncontrolled pain   Complete by: As directed    Call MD for:  temperature >100.4   Complete by: As directed    Diet Carb Modified   Complete by: As directed    Discharge instructions   Complete by: As directed    You were cared for by a hospitalist during your hospital stay. If you have any questions about your discharge medications or the care you received while you were in the hospital after you are discharged, you can call the unit and ask to speak with the hospitalist on call if the hospitalist that took care of you is not available. Once you are discharged, your primary care physician will handle any further medical issues. Please note that NO REFILLS for any discharge medications will be authorized once you are discharged, as it is imperative that you return to your primary care physician (or establish a relationship with a primary care physician if you do not have one) for your aftercare needs so that they can reassess your need for medications and monitor your  lab values.   Increase activity slowly   Complete by: As directed       Allergies as of 09/07/2023       Reactions   Lyrica [pregabalin] Shortness Of Breath, Itching, Rash   Rocephin [ceftriaxone Sodium In Dextrose] Shortness Of Breath   Sulfa Antibiotics Hives, Shortness Of Breath, Itching   Ultram [tramadol] Hives, Shortness Of Breath   Had asthma attack   Vancomycin Anaphylaxis   Per Dr. Flonnie Overman   Glucotrol  [glipizide] Hives   Keflex [cephalexin] Hives   Penicillins Hives   Has patient had a PCN reaction causing immediate rash, facial/tongue/throat swelling, SOB or lightheadedness with hypotension: YES Has patient had a PCN reaction causing severe rash involving mucus membranes or skin necrosis: NO Has patient had a PCN reaction that required hospitalization: YES Has patient had a PCN reaction occurring within the last 10 years: YES If all of the above answers are "NO", then may proceed with Cephalosporin use.   Janumet [sitagliptin-metformin Hcl] Nausea Only        Medication List     STOP taking these medications    atenolol 50 MG tablet Commonly known as: TENORMIN   azithromycin 250 MG tablet Commonly known as: Zithromax Z-Pak   Brilinta 90 MG Tabs tablet Generic drug: ticagrelor   fluconazole 200 MG tablet Commonly known as: DIFLUCAN   furosemide 20 MG tablet Commonly known as: LASIX   nitrofurantoin (macrocrystal-monohydrate) 100 MG capsule Commonly known as: MACROBID   Potassium Chloride 10 MEQ Pack       TAKE these medications    albuterol 108 (90 Base) MCG/ACT inhaler Commonly known as: VENTOLIN HFA Inhale 1-2 puffs into the lungs every 6 (six) hours as needed for wheezing or shortness of breath.   amitriptyline 150 MG tablet Commonly known as: ELAVIL Take 150 mg by mouth at bedtime.   amLODipine 10 MG tablet Commonly known as: NORVASC Take 1 tablet (10 mg total) by mouth daily.   aspirin 81 MG chewable tablet Chew 1 tablet (81 mg total) by mouth daily.   baclofen 10 MG tablet Commonly known as: LIORESAL Take 10 mg by mouth 3 (three) times daily.   busPIRone 10 MG tablet Commonly known as: BUSPAR Take 10 mg by mouth 3 (three) times daily.   famotidine 20 MG tablet Commonly known as: PEPCID Take 1 tablet (20 mg total) by mouth 2 (two) times daily.   gabapentin 300 MG capsule Commonly known as: NEURONTIN Take 1 capsule (300 mg total) by mouth  3 (three) times daily.   gabapentin 300 MG capsule Commonly known as: NEURONTIN Take 1 capsule (300 mg total) by mouth 3 (three) times daily.   gabapentin 300 MG capsule Commonly known as: NEURONTIN Take 1 capsule (300 mg total) by mouth 3 (three) times daily.   hydrocortisone 2.5 % rectal cream Commonly known as: ANUSOL-HC Apply 1 Application topically 3 (three) times daily.   Insulin Pen Needle 31G X 5 MM Misc 1 Units by Does not apply route 4 (four) times daily.   Latuda 80 MG Tabs tablet Generic drug: lurasidone Take 80 mg by mouth every evening.   Linzess 290 MCG Caps capsule Generic drug: linaclotide Take 290 mcg by mouth daily before breakfast.   lisinopril-hydrochlorothiazide 20-12.5 MG tablet Commonly known as: ZESTORETIC Take 1 tablet by mouth daily.   Maalox Max 400-400-40 MG/5ML suspension Generic drug: alum & mag hydroxide-simeth Take 15 mLs by mouth every 6 (six) hours as needed for indigestion.  metoprolol tartrate 25 MG tablet Commonly known as: LOPRESSOR Take 1 tablet (25 mg total) by mouth 2 (two) times daily.   nitroGLYCERIN 0.4 MG SL tablet Commonly known as: NITROSTAT Place 1 tablet (0.4 mg total) under the tongue every 5 (five) minutes x 3 doses as needed for chest pain.   NovoLOG FlexPen 100 UNIT/ML FlexPen Generic drug: insulin aspart Inject 10-15 Units into the skin 3 (three) times daily as needed for high blood sugar. Sliding Scale   Oxycodone HCl 10 MG Tabs Take 10 mg by mouth 4 (four) times daily as needed (pain).   pantoprazole 40 MG tablet Commonly known as: PROTONIX Take 1 tablet (40 mg total) by mouth daily.   potassium chloride SA 20 MEQ tablet Commonly known as: KLOR-CON M Take one tablet twice a day for five days, then take  one tablet every day.   rosuvastatin 20 MG tablet Commonly known as: CRESTOR Take 20 mg by mouth at bedtime. What changed: Another medication with the same name was removed. Continue taking this  medication, and follow the directions you see here.   sucralfate 1 g tablet Commonly known as: CARAFATE Take 1 g by mouth 2 (two) times daily.   Symbicort 160-4.5 MCG/ACT inhaler Generic drug: budesonide-formoterol Inhale 2 puffs into the lungs 2 (two) times daily.   topiramate 200 MG tablet Commonly known as: TOPAMAX Take 200 mg by mouth at bedtime.   traZODone 100 MG tablet Commonly known as: DESYREL Take 200 mg by mouth at bedtime.   Evaristo Bury FlexTouch 100 UNIT/ML FlexTouch Pen Generic drug: insulin degludec Inject 12 Units into the skin at bedtime.   TYLENOL PO Take 2 tablets by mouth 3 (three) times daily as needed (tooth ache).   Vitamin D3 1.25 MG (50000 UT) Caps Take 1 capsule by mouth once a week.   Dialyvite Vitamin D3 Max 1.25 MG (50000 UT) Tabs Generic drug: Cholecalciferol Take 1 tablet by mouth once a week.   Cholecalciferol 1.25 MG (50000 UT) capsule Take 1 capsule by mouth once a week.        Follow-up Information     Salli Real, MD. Schedule an appointment as soon as possible for a visit in 1 week(s).   Specialty: Internal Medicine Contact information: [address] [place] [address] [phone]                Allergies  Allergen Reactions   Lyrica [Pregabalin] Shortness Of Breath, Itching and Rash   Rocephin [Ceftriaxone Sodium In Dextrose] Shortness Of Breath   Sulfa Antibiotics Hives, Shortness Of Breath and Itching   Ultram [Tramadol] Hives and Shortness Of Breath    Had asthma attack   Vancomycin Anaphylaxis    Per Dr. Flonnie Overman   Glucotrol [Glipizide] Hives   Keflex [Cephalexin] Hives   Penicillins Hives    Has patient had a PCN reaction causing immediate rash, facial/tongue/throat swelling, SOB or lightheadedness with hypotension: YES Has patient had a PCN reaction causing severe rash involving mucus membranes or skin necrosis: NO Has patient had a PCN reaction that required hospitalization: YES Has patient had a PCN  reaction occurring within the last 10 years: YES If all of the above answers are "NO", then may proceed with Cephalosporin use.   Janumet [Sitagliptin-Metformin Hcl] Nausea Only    Consultations: PCCM admit    Procedures/Studies: CT ANGIO HEAD NECK W WO CM  Result Date: 09/06/2023 CLINICAL DATA:  Initial evaluation for neuro deficit, stroke suspected. Question arterial thrombus on prior  CT. EXAM: CT ANGIOGRAPHY HEAD AND NECK WITH AND WITHOUT CONTRAST TECHNIQUE: Multidetector CT imaging of the head and neck was performed using the standard protocol during bolus administration of intravenous contrast. Multiplanar CT image reconstructions and MIPs were obtained to evaluate the vascular anatomy. Carotid stenosis measurements (when applicable) are obtained utilizing NASCET criteria, using the distal internal carotid diameter as the denominator. RADIATION DOSE REDUCTION: This exam was performed according to the departmental dose-optimization program which includes automated exposure control, adjustment of the mA and/or kV according to patient size and/or use of iterative reconstruction technique. CONTRAST:  75mL OMNIPAQUE IOHEXOL 350 MG/ML SOLN COMPARISON:  Prior CT from 09/05/2023. FINDINGS: CTA NECK FINDINGS Aortic arch: Visualized aortic arch within normal limits for caliber with standard branch pattern. Mild aortic atherosclerosis. No stenosis about the origin the great vessels. Right carotid system: Right common and internal carotid arteries are patent without dissection. Mild atheromatous change about the right carotid bulb without hemodynamically significant greater than 50% stenosis. Left carotid system: Left common and internal carotid arteries are patent without dissection. Mild atheromatous change about the left carotid bulb without hemodynamically significant greater than 50% stenosis. Vertebral arteries: Both vertebral arteries arise from subclavian arteries. No proximal subclavian artery stenosis.  Vertebral arteries patent without stenosis or dissection. Skeleton: Subcentimeter sclerotic lesion within the dens noted, likely small benign bone island. No other worrisome osseous lesions. Other neck: No other acute finding. Few scattered thyroid nodules, largest of which measures 1.9 cm on the left. The 7 previously evaluated by ultrasound as well as biopsy (ref: J Am Coll Radiol. 2015 Feb;12(2): 143-50). Upper chest: Small and secretions noted within the esophageal lumen. Paraseptal emphysematous changes. No other acute finding. Review of the MIP images confirms the above findings CTA HEAD FINDINGS Anterior circulation: Atheromatous change seen about the carotid siphons bilaterally with associated moderate multifocal narrowing. A1 segments patent bilaterally. Normal anterior communicating complex. Anterior cerebral arteries patent without stenosis. No M1 stenosis or occlusion. No proximal MCA branch occlusion or high-grade stenosis. Distal MCA branches perfused and symmetric. Posterior circulation: Both V4 segments patent without stenosis. Both PICA patent. Basilar patent without significant stenosis. Superior cerebral arteries patent bilaterally. Both PCAs primarily supplied via the basilar. Atheromatous irregularity seen about the PCAs bilaterally with associated mild to moderate bilateral P2 stenoses (series 9, image 22). PCAs remain patent to their distal aspects. Venous sinuses: Grossly patent allowing for timing of the contrast bolus. Anatomic variants: None significant.  No aneurysm. Review of the MIP images confirms the above findings IMPRESSION: 1. Negative CTA for large vessel occlusion or other emergent finding. 2. Intracranial atherosclerotic disease. Associated moderate multifocal narrowing about the carotid siphons bilaterally, with mild to moderate bilateral PCA stenoses. 3. Mild atheromatous change about the carotid bifurcations without hemodynamically significant greater than 50% stenosis.  Aortic Atherosclerosis (ICD10-I70.0) and Emphysema (ICD10-J43.9). Electronically Signed   By: Rise Mu M.D.   On: 09/06/2023 03:28   CT Head Wo Contrast  Result Date: 09/05/2023 CLINICAL DATA:  Altered mental status EXAM: CT HEAD WITHOUT CONTRAST TECHNIQUE: Contiguous axial images were obtained from the base of the skull through the vertex without intravenous contrast. RADIATION DOSE REDUCTION: This exam was performed according to the departmental dose-optimization program which includes automated exposure control, adjustment of the mA and/or kV according to patient size and/or use of iterative reconstruction technique. COMPARISON:  10/06/2022 FINDINGS: Brain: Normal anatomic configuration. Left parietal cortical encephalomalacia is unchanged in keeping with remote trauma or infarct. No evidence of acute intracranial hemorrhage  or infarct. No abnormal mass effect or midline shift. No abnormal intra or extra-axial mass lesion. Ventricular size is normal. Cerebellum is unremarkable. Vascular: The proximal right MCA appears hyperdense, possibly related to intraluminal thrombus. Skull: Normal. Negative for fracture or focal lesion. Sinuses/Orbits: No acute finding. Other: Mastoid air cells and middle ear cavities are clear. IMPRESSION: 1. Hyperdense proximal right MCA, possibly related to intraluminal thrombus. This could be confirmed with CTA. 2. No acute intracranial hemorrhage or infarct. 3. Stable left parietal cortical encephalomalacia in keeping with remote trauma or infarct. Electronically Signed   By: Helyn Numbers M.D.   On: 09/05/2023 23:17   DG Chest 1 View  Result Date: 09/05/2023 CLINICAL DATA:  Altered mental status EXAM: CHEST  1 VIEW COMPARISON:  Chest x-ray 04/30/2023 FINDINGS: The heart size and mediastinal contours are within normal limits. Both lungs are clear. The visualized skeletal structures are unremarkable. IMPRESSION: No active disease. Electronically Signed   By: Darliss Cheney M.D.   On: 09/05/2023 22:40       Discharge Exam: Vitals:   09/07/23 0740 09/07/23 0800  BP:  130/80  Pulse:  86  Resp:  (!) 22  Temp: 98.2 F (36.8 C)   SpO2:  95%    General: Pt is alert, awake, not in acute distress Cardiovascular: RRR, S1/S2 +, no edema Respiratory: CTA bilaterally, no wheezing, no rhonchi, no respiratory distress, no conversational dyspnea  Abdominal: Soft, NT, ND, bowel sounds + Extremities: no edema, no cyanosis Psych: Normal mood and affect, stable judgement and insight     The results of significant diagnostics from this hospitalization (including imaging, microbiology, ancillary and laboratory) are listed below for reference.     Microbiology: Recent Results (from the past 240 hour(s))  MRSA Next Gen by PCR, Nasal     Status: None   Collection Time: 09/06/23  4:10 AM   Specimen: Nasal Mucosa; Nasal Swab  Result Value Ref Range Status   MRSA by PCR Next Gen NOT DETECTED NOT DETECTED Final    Comment: (NOTE) The GeneXpert MRSA Assay (FDA approved for NASAL specimens only), is one component of a comprehensive MRSA colonization surveillance program. It is not intended to diagnose MRSA infection nor to guide or monitor treatment for MRSA infections. Test performance is not FDA approved in patients less than 66 years old. Performed at [facility], [address] [place], [place] [address]   Culture, blood (Routine X 2) w Reflex to ID Panel     Status: None (Preliminary result)   Collection Time: 09/06/23  7:04 PM   Specimen: BLOOD RIGHT ARM  Result Value Ref Range Status   Specimen Description BLOOD RIGHT ARM  Final   Special Requests   Final    BOTTLES DRAWN AEROBIC ONLY Blood Culture results may not be optimal due to an inadequate volume of blood received in culture bottles   Culture   Final    NO GROWTH < 12 HOURS Performed at [facility], [address] [place], [place] [address]    Report Status PENDING   Incomplete     Labs: BNP (last 3 results) No results for input(s): "BNP" in the last 8760 hours. Basic Metabolic Panel: Recent Labs  Lab 09/05/23 2206 09/05/23 2314 09/06/23 0216 09/06/23 1415  NA 131* 136 133* 138  K 5.2* 3.9 3.5 3.7  CL  --  95* 99 108  CO2  --  21* 19* 20*  GLUCOSE  --  654*  354* 181*  BUN  --  16 13 12   CREATININE  --  1.03* 0.96 0.69  CALCIUM  --  10.6* 9.8 9.5  MG  --   --   --  1.8   Liver Function Tests: Recent Labs  Lab 09/05/23 2314  AST 14*  ALT 14  ALKPHOS 150*  BILITOT 0.7  PROT 7.6  ALBUMIN 3.9   Recent Labs  Lab 09/05/23 2314  LIPASE 23   No results for input(s): "AMMONIA" in the last 168 hours. CBC: Recent Labs  Lab 09/05/23 2159 09/05/23 2206 09/06/23 1415  WBC 10.4  --  11.7*  NEUTROABS 7.5  --   --   HGB 17.0* 18.7* 14.8  HCT 51.3* 55.0* 45.0  MCV 84.4  --  82.4  PLT 216  --  204   Cardiac Enzymes: No results for input(s): "CKTOTAL", "CKMB", "CKMBINDEX", "TROPONINI" in the last 168 hours. BNP: Invalid input(s): "POCBNP" CBG: Recent Labs  Lab 09/06/23 1548 09/06/23 1942 09/06/23 2340 09/07/23 0346 09/07/23 0737  GLUCAP 191* 230* 117* 106* 111*   D-Dimer No results for input(s): "DDIMER" in the last 72 hours. Hgb A1c No results for input(s): "HGBA1C" in the last 72 hours. Lipid Profile No results for input(s): "CHOL", "HDL", "LDLCALC", "TRIG", "CHOLHDL", "LDLDIRECT" in the last 72 hours. Thyroid function studies No results for input(s): "TSH", "T4TOTAL", "T3FREE", "THYROIDAB" in the last 72 hours.  Invalid input(s): "FREET3" Anemia work up No results for input(s): "VITAMINB12", "FOLATE", "FERRITIN", "TIBC", "IRON", "RETICCTPCT" in the last 72 hours. Urinalysis    Component Value Date/Time   COLORURINE STRAW (A) 09/05/2023 2208   APPEARANCEUR CLEAR 09/05/2023 2208   LABSPEC 1.035 (H) 09/05/2023 2208   PHURINE 6.0 09/05/2023 2208   GLUCOSEU >=500 (A) 09/05/2023 2208   HGBUR NEGATIVE 09/05/2023 2208    BILIRUBINUR NEGATIVE 09/05/2023 2208   BILIRUBINUR negative 03/04/2019 0950   BILIRUBINUR neg 02/13/2018 1219   KETONESUR 20 (A) 09/05/2023 2208   PROTEINUR NEGATIVE 09/05/2023 2208   UROBILINOGEN 0.2 12/05/2020 1151   NITRITE NEGATIVE 09/05/2023 2208   LEUKOCYTESUR NEGATIVE 09/05/2023 2208   Sepsis Labs Recent Labs  Lab 09/05/23 2159 09/06/23 1415  WBC 10.4 11.7*   Microbiology Recent Results (from the past 240 hour(s))  MRSA Next Gen by PCR, Nasal     Status: None   Collection Time: 09/06/23  4:10 AM   Specimen: Nasal Mucosa; Nasal Swab  Result Value Ref Range Status   MRSA by PCR Next Gen NOT DETECTED NOT DETECTED Final    Comment: (NOTE) The GeneXpert MRSA Assay (FDA approved for NASAL specimens only), is one component of a comprehensive MRSA colonization surveillance program. It is not intended to diagnose MRSA infection nor to guide or monitor treatment for MRSA infections. Test performance is not FDA approved in patients less than 65 years old. Performed at [facility], [address], [place], [place] [address]   Culture, blood (Routine X 2) w Reflex to ID Panel     Status: None (Preliminary result)   Collection Time: 09/06/23  7:04 PM   Specimen: BLOOD RIGHT ARM  Result Value Ref Range Status   Specimen Description BLOOD RIGHT ARM  Final   Special Requests   Final    BOTTLES DRAWN AEROBIC ONLY Blood Culture results may not be optimal due to an inadequate volume of blood received in culture bottles   Culture   Final    NO GROWTH < 12 HOURS Performed at [facility]  Lab, [address], [place], [place] [address]    Report Status PENDING  Incomplete     Patient was seen and examined on the day of discharge and was found to be in stable condition. Time coordinating discharge: 40 minutes including assessment and coordination of care, as well as examination of the patient.   SIGNED:  Noralee Stain, DO [facility] 09/07/2023, 11:01 AM

## 2023-09-08 LAB — HEMOGLOBIN A1C
Hgb A1c MFr Bld: 14.5 % — ABNORMAL HIGH (ref 4.8–5.6)
Mean Plasma Glucose: 369.45 mg/dL

## 2023-09-08 NOTE — ED Provider Notes (Signed)
[facility] Provider Note   CSN: [id number] Arrival date & time: 09/05/23  2138     History  Chief Complaint  Patient presents with   Altered Mental Status   Hyperglycemia    Stephanie Thornton is a 52 y.o. female.  Presenting emergency department for altered mental status.  Patient has been out of her insulin for the past several days.  Last seen normal this past Tuesday.  No focal deficits on exam.  Patient is somnolent and unable to arouse to a meaningful history. Altered Mental Status Hyperglycemia Associated symptoms: altered mental status        Home Medications Prior to Admission medications   Medication Sig Start Date End Date Taking? Authorizing Provider  Insulin Pen Needle 31G X 5 MM MISC 1 Units by Does not apply route 4 (four) times daily. 09/07/23  Yes Noralee Stain, DO  Acetaminophen (TYLENOL PO) Take 2 tablets by mouth 3 (three) times daily as needed (tooth ache).    [provider]  albuterol (PROVENTIL HFA;VENTOLIN HFA) 108 (90 Base) MCG/ACT inhaler Inhale 1-2 puffs into the lungs every 6 (six) hours as needed for wheezing or shortness of breath. 03/04/19   Grayce Sessions, NP  alum & mag hydroxide-simeth (MAALOX MAX) 400-400-40 MG/5ML suspension Take 15 mLs by mouth every 6 (six) hours as needed for indigestion. 04/30/23   Elayne Snare K, DO  amitriptyline (ELAVIL) 150 MG tablet Take 150 mg by mouth at bedtime. 08/06/22   [provider]  amLODipine (NORVASC) 10 MG tablet Take 1 tablet (10 mg total) by mouth daily. 09/23/22   Quintella Reichert, MD  aspirin 81 MG chewable tablet Chew 1 tablet (81 mg total) by mouth daily. 05/09/22   Cyndi Bender, NP  baclofen (LIORESAL) 10 MG tablet Take 10 mg by mouth 3 (three) times daily. 05/05/22   [provider]  busPIRone (BUSPAR) 10 MG tablet Take 10 mg by mouth 3 (three) times daily. 01/25/19   [provider]  Cholecalciferol (DIALYVITE VITAMIN D3 MAX) 1.25 MG (50000 UT)  TABS Take 1 tablet by mouth once a week. 10/08/22     Cholecalciferol (VITAMIN D3) 1.25 MG (50000 UT) CAPS Take 1 capsule by mouth once a week. 08/08/22   [provider]  Cholecalciferol 1.25 MG (50000 UT) capsule Take 1 capsule by mouth once a week. 11/11/22     famotidine (PEPCID) 20 MG tablet Take 1 tablet (20 mg total) by mouth 2 (two) times daily. 04/30/23   Elayne Snare K, DO  gabapentin (NEURONTIN) 300 MG capsule Take 1 capsule (300 mg total) by mouth 3 (three) times daily. 06/29/19   Grayce Sessions, NP  gabapentin (NEURONTIN) 300 MG capsule Take 1 capsule (300 mg total) by mouth 3 (three) times daily. 10/08/22     gabapentin (NEURONTIN) 300 MG capsule Take 1 capsule (300 mg total) by mouth 3 (three) times daily. 11/11/22     hydrocortisone (ANUSOL-HC) 2.5 % rectal cream Apply 1 Application topically 3 (three) times daily. 06/17/23   [provider]  LATUDA 80 MG TABS tablet Take 80 mg by mouth every evening. 02/01/19   [provider]  LINZESS 290 MCG CAPS capsule Take 290 mcg by mouth daily before breakfast. 07/22/23   [provider]  lisinopril-hydrochlorothiazide (ZESTORETIC) 20-12.5 MG tablet Take 1 tablet by mouth daily.    [provider]  metoprolol tartrate (LOPRESSOR) 25 MG tablet Take 1 tablet (25 mg total) by mouth  2 (two) times daily. 09/23/22   Quintella Reichert, MD  nitroGLYCERIN (NITROSTAT) 0.4 MG SL tablet Place 1 tablet (0.4 mg total) under the tongue every 5 (five) minutes x 3 doses as needed for chest pain. 05/09/22   Cyndi Bender, NP  NOVOLOG FLEXPEN 100 UNIT/ML FlexPen Inject 10-15 Units into the skin 3 (three) times daily as needed for high blood sugar. Sliding Scale 09/07/23   Noralee Stain, DO  Oxycodone HCl 10 MG TABS Take 10 mg by mouth 4 (four) times daily as needed (pain). 07/08/22   [provider]  pantoprazole (PROTONIX) 40 MG tablet Take 1 tablet (40 mg total) by mouth daily. 10/30/22     potassium chloride SA  (KLOR-CON M) 20 MEQ tablet Take one tablet twice a day for five days, then take  one tablet every day. 07/17/22   Dione Booze, MD  rosuvastatin (CRESTOR) 20 MG tablet Take 20 mg by mouth at bedtime. 07/29/23   [provider]  sucralfate (CARAFATE) 1 g tablet Take 1 g by mouth 2 (two) times daily. 04/02/23   [provider]  SYMBICORT 160-4.5 MCG/ACT inhaler Inhale 2 puffs into the lungs 2 (two) times daily. 03/16/23   [provider]  topiramate (TOPAMAX) 200 MG tablet Take 200 mg by mouth at bedtime. 01/28/19   [provider]  traZODone (DESYREL) 100 MG tablet Take 200 mg by mouth at bedtime. 02/24/19   [provider]  TRESIBA FLEXTOUCH 100 UNIT/ML FlexTouch Pen Inject 12 Units into the skin at bedtime. 09/07/23   Noralee Stain, DO      Allergies    Lyrica [pregabalin], Rocephin [ceftriaxone sodium in dextrose], Sulfa antibiotics, Ultram [tramadol], Vancomycin, Glucotrol [glipizide], Keflex [cephalexin], Penicillins, and Janumet [sitagliptin-metformin hcl]    Review of Systems   Review of Systems  Physical Exam Updated Vital Signs BP 115/89 (BP Location: Right Arm)   Pulse 84   Temp 98.2 F (36.8 C) (Oral)   Resp 18   Ht 5\' 2"  (1.575 m)   Wt 53.4 kg   SpO2 97%   BMI 21.53 kg/m  Physical Exam Vitals and nursing note reviewed.  Constitutional:      Comments: Somnolent.  Confused.  HENT:     Mouth/Throat:     Mouth: Mucous membranes are moist.  Eyes:     Conjunctiva/sclera: Conjunctivae normal.  Cardiovascular:     Rate and Rhythm: Normal rate and regular rhythm.  Pulmonary:     Effort: Pulmonary effort is normal.     Breath sounds: Normal breath sounds.  Abdominal:     General: Abdomen is flat. There is no distension.     Tenderness: There is no abdominal tenderness. There is no guarding or rebound.  Musculoskeletal:        General: No deformity. Normal range of motion.  Skin:    General: Skin is warm and dry.     Capillary  Refill: Capillary refill takes less than 2 seconds.     Findings: No erythema or rash.  Neurological:     Mental Status: She is disoriented.     Comments: Limited by patient's participation.  Will move all extremities to painful stimuli.     ED Results / Procedures / Treatments   Labs (all labs ordered are listed, but only abnormal results are displayed) Labs Reviewed  BETA-HYDROXYBUTYRIC ACID - Abnormal; Notable for the following components:      Result Value   Beta-Hydroxybutyric Acid 1.44 (*)    All other  components within normal limits  CBC WITH DIFFERENTIAL/PLATELET - Abnormal; Notable for the following components:   RBC 6.08 (*)    Hemoglobin 17.0 (*)    HCT 51.3 (*)    All other components within normal limits  URINALYSIS, ROUTINE W REFLEX MICROSCOPIC - Abnormal; Notable for the following components:   Color, Urine STRAW (*)    Specific Gravity, Urine 1.035 (*)    Glucose, UA >=500 (*)    Ketones, ur 20 (*)    All other components within normal limits  BASIC METABOLIC PANEL - Abnormal; Notable for the following components:   Sodium 133 (*)    CO2 19 (*)    Glucose, Bld 354 (*)    All other components within normal limits  BASIC METABOLIC PANEL - Abnormal; Notable for the following components:   CO2 20 (*)    Glucose, Bld 181 (*)    All other components within normal limits  BASIC METABOLIC PANEL - Abnormal; Notable for the following components:   Chloride 95 (*)    CO2 21 (*)    Glucose, Bld 654 (*)    Creatinine, Ser 1.03 (*)    Calcium 10.6 (*)    Anion gap 20 (*)    All other components within normal limits  HEPATIC FUNCTION PANEL - Abnormal; Notable for the following components:   AST 14 (*)    Alkaline Phosphatase 150 (*)    All other components within normal limits  CBC - Abnormal; Notable for the following components:   WBC 11.7 (*)    RBC 5.46 (*)    All other components within normal limits  GLUCOSE, CAPILLARY - Abnormal; Notable for the following  components:   Glucose-Capillary 240 (*)    All other components within normal limits  GLUCOSE, CAPILLARY - Abnormal; Notable for the following components:   Glucose-Capillary 226 (*)    All other components within normal limits  GLUCOSE, CAPILLARY - Abnormal; Notable for the following components:   Glucose-Capillary 169 (*)    All other components within normal limits  GLUCOSE, CAPILLARY - Abnormal; Notable for the following components:   Glucose-Capillary 193 (*)    All other components within normal limits  GLUCOSE, CAPILLARY - Abnormal; Notable for the following components:   Glucose-Capillary 183 (*)    All other components within normal limits  GLUCOSE, CAPILLARY - Abnormal; Notable for the following components:   Glucose-Capillary 156 (*)    All other components within normal limits  GLUCOSE, CAPILLARY - Abnormal; Notable for the following components:   Glucose-Capillary 150 (*)    All other components within normal limits  GLUCOSE, CAPILLARY - Abnormal; Notable for the following components:   Glucose-Capillary 137 (*)    All other components within normal limits  GLUCOSE, CAPILLARY - Abnormal; Notable for the following components:   Glucose-Capillary 128 (*)    All other components within normal limits  GLUCOSE, CAPILLARY - Abnormal; Notable for the following components:   Glucose-Capillary 162 (*)    All other components within normal limits  GLUCOSE, CAPILLARY - Abnormal; Notable for the following components:   Glucose-Capillary 191 (*)    All other components within normal limits  GLUCOSE, CAPILLARY - Abnormal; Notable for the following components:   Glucose-Capillary 230 (*)    All other components within normal limits  GLUCOSE, CAPILLARY - Abnormal; Notable for the following components:   Glucose-Capillary 117 (*)    All other components within normal limits  GLUCOSE, CAPILLARY - Abnormal; Notable for  the following components:   Glucose-Capillary 106 (*)    All  other components within normal limits  GLUCOSE, CAPILLARY - Abnormal; Notable for the following components:   Glucose-Capillary 111 (*)    All other components within normal limits  GLUCOSE, CAPILLARY - Abnormal; Notable for the following components:   Glucose-Capillary 154 (*)    All other components within normal limits  HEMOGLOBIN A1C - Abnormal; Notable for the following components:   Hgb A1c MFr Bld 14.5 (*)    All other components within normal limits  CBG MONITORING, ED - Abnormal; Notable for the following components:   Glucose-Capillary >600 (*)    All other components within normal limits  CBG MONITORING, ED - Abnormal; Notable for the following components:   Glucose-Capillary 551 (*)    All other components within normal limits  I-STAT VENOUS BLOOD GAS, ED - Abnormal; Notable for the following components:   pH, Ven 7.495 (*)    pCO2, Ven 27.8 (*)    pO2, Ven 60 (*)    Sodium 131 (*)    Potassium 5.2 (*)    Calcium, Ion 1.12 (*)    HCT 55.0 (*)    Hemoglobin 18.7 (*)    All other components within normal limits  CBG MONITORING, ED - Abnormal; Notable for the following components:   Glucose-Capillary 495 (*)    All other components within normal limits  CBG MONITORING, ED - Abnormal; Notable for the following components:   Glucose-Capillary 506 (*)    All other components within normal limits  CBG MONITORING, ED - Abnormal; Notable for the following components:   Glucose-Capillary 392 (*)    All other components within normal limits  CBG MONITORING, ED - Abnormal; Notable for the following components:   Glucose-Capillary 254 (*)    All other components within normal limits  CBG MONITORING, ED - Abnormal; Notable for the following components:   Glucose-Capillary 315 (*)    All other components within normal limits  CULTURE, BLOOD (ROUTINE X 2)  MRSA NEXT GEN BY PCR, NASAL  ETHANOL  LIPASE, BLOOD  GAMMA GT  MAGNESIUM  HIV ANTIBODY (ROUTINE TESTING W REFLEX)     EKG EKG Interpretation Date/Time:  Friday September 05 2023 22:14:34 EDT Ventricular Rate:  120 PR Interval:  183 QRS Duration:  91 QT Interval:  299 QTC Calculation: 423 R Axis:   -54  Text Interpretation: Sinus tachycardia LAE, consider biatrial enlargement Left anterior fascicular block Nonspecific T abnormalities, lateral leads Confirmed by Zadie Rhine ([id number]) on 09/06/2023 12:07:32 AM  Radiology No results found.  Procedures Procedures    Medications Ordered in ED Medications  lactated ringers bolus 1,000 mL (0 mLs Intravenous Stopped 09/06/23 0031)  potassium chloride 10 mEq in 100 mL IVPB (0 mEq Intravenous Stopped 09/06/23 0135)  hydrALAZINE (APRESOLINE) injection 10 mg (10 mg Intravenous Given 09/05/23 2328)  iohexol (OMNIPAQUE) 350 MG/ML injection 75 mL (75 mLs Intravenous Contrast Given 09/06/23 0207)    ED Course/ Medical Decision Making/ A&P Clinical Course as of 09/08/23 2325  Fri Sep 05, 2023  2314 PMH per chart review: " CAD with recent NSTEMI s/p to LCx 05/07/2022, T2DM, asthma, HTN, HLD, depression/anxiety/bipolar disorder, OSA not using CPA" [TY]  2324 Beta-Hydroxybutyric Acid(!): 1.44 [TY]  2324 Glucose-Capillary(!!): >600 [TY]  2359 Case discussed with Dr. Amada Jupiter with neurology.  Will await patient's kidney function and if can handle contrast will get CTA head and neck, otherwise would get MRI/MRA brain to evaluate CT scan findings. [  TY]  Sat Sep 06, 2023  0012 Patient with improvement of mental status. Reports having residual right sided weakness from prior stokes which she says is maybe worse over the past 2 weeks. Denies HA, Vision changes, no CP or SOB. Participating more with exam and does have some minor weakness in right upper and lower extremities as compared to right. No facial droop, or aphasia. Normal sensation throughout all extremities.  [TY]    Clinical Course User Index [TY] Coral Spikes, DO                                 Medical  Decision Making Is a 52 year old female presenting emergency department for altered mental status.  History of medication noncompliance and diabetes.  Patient is extremely hypertensive with blood pressure in the 200s systolic.  CT head evaluate for acute intracranial pathology.  Will get broad altered mental status workup.  Concern for possible DKA.  See ED course for further MDM and disposition.  Amount and/or Complexity of Data Reviewed Independent Historian: EMS    Details: Reported last known normal this past Tuesday External Data Reviewed:     Details: History of medication noncompliance.  Prior stroke. Labs: ordered. Decision-making details documented in ED Course. Radiology: ordered. Discussion of management or test interpretation with external provider(s): Case discussed and signed out to oncoming emergency department provider.  Pending CTA and admission for patient's altered mental status with HHS/DKA type picture.  Risk Prescription drug management. Decision regarding hospitalization.         Final Clinical Impression(s) / ED Diagnoses Final diagnoses:  Hyperglycemia  Delirium    Rx / DC Orders ED Discharge Orders          Ordered    TRESIBA FLEXTOUCH 100 UNIT/ML FlexTouch Pen  Daily at bedtime        09/07/23 1101    NOVOLOG FLEXPEN 100 UNIT/ML FlexPen  3 times daily PRN        09/07/23 1101    Insulin Pen Needle 31G X 5 MM MISC  4 times daily        09/07/23 1101    Increase activity slowly        09/07/23 1101    Discharge instructions       Comments: You were cared for by a hospitalist during your hospital stay. If you have any questions about your discharge medications or the care you received while you were in the hospital after you are discharged, you can call the unit and ask to speak with the hospitalist on call if the hospitalist that took care of you is not available. Once you are discharged, your primary care physician will handle any further medical  issues. Please note that NO REFILLS for any discharge medications will be authorized once you are discharged, as it is imperative that you return to your primary care physician (or establish a relationship with a primary care physician if you do not have one) for your aftercare needs so that they can reassess your need for medications and monitor your lab values.   09/07/23 1101    Diet Carb Modified        09/07/23 1101    Call MD for:  extreme fatigue        09/07/23 1101    Call MD for:  persistant dizziness or light-headedness        09/07/23 1101  Call MD for:  difficulty breathing, headache or visual disturbances        09/07/23 1101    Call MD for:  severe uncontrolled pain        09/07/23 1101    Call MD for:  persistant nausea and vomiting        09/07/23 1101    Call MD for:  temperature >100.4        09/07/23 1101              Coral Spikes, DO 09/08/23 2325

## 2023-09-09 ENCOUNTER — Ambulatory Visit (INDEPENDENT_AMBULATORY_CARE_PROVIDER_SITE_OTHER): Payer: Medicare HMO | Admitting: Podiatry

## 2023-09-09 ENCOUNTER — Encounter: Payer: Self-pay | Admitting: Podiatry

## 2023-09-09 ENCOUNTER — Ambulatory Visit (INDEPENDENT_AMBULATORY_CARE_PROVIDER_SITE_OTHER): Payer: Medicare HMO

## 2023-09-09 ENCOUNTER — Telehealth: Payer: Self-pay | Admitting: Podiatry

## 2023-09-09 DIAGNOSIS — T847XXA Infection and inflammatory reaction due to other internal orthopedic prosthetic devices, implants and grafts, initial encounter: Secondary | ICD-10-CM

## 2023-09-09 DIAGNOSIS — M2010 Hallux valgus (acquired), unspecified foot: Secondary | ICD-10-CM

## 2023-09-09 MED ORDER — CLINDAMYCIN HCL 150 MG PO CAPS: 150.0000 mg | ORAL_CAPSULE | Freq: Three times a day (TID) | ORAL | 1 refills | Status: DC

## 2023-09-09 NOTE — Telephone Encounter (Signed)
DOS-09/26/23  REMOVAL FIXATION DEEP 2ND [id number]  [employer] EFFECTIVE DATE- 02/27/2009 DEDUCTIBLE- $1,632.00  COINSURANCE-20%  PER [facility] WEBSITE, NO PRIOR AUTH IS REQUIRED FOR CPT CODE 02725.  TRACKING #: [id number]

## 2023-09-09 NOTE — Progress Notes (Signed)
Subjective:  Patient ID: Stephanie Thornton, female    DOB: 09-30-1971,  MRN: [id number] HPI Chief Complaint  Patient presents with   Toe Pain    2nd toe right - previous HT surgery, been really painful x 1 week, can barely get on a shoe somedays   New Patient (Initial Visit)    Est pt 2016    52 y.o. female presents with the above complaint.   ROS: Denies fever chills nausea by muscle aches pains calf pain back pain chest pain shortness of breath.  Past Medical History:  Diagnosis Date   Anxiety    Arthritis    "knees, feet, back" (05/24/2015)   Benign essential HTN 05/01/2015   Bipolar disorder (HCC) diagnosed early 90s   CAD in native artery    Chronic bronchitis (HCC)    "get it q yr"   Chronic lower back pain    Depression    Diabetes mellitus type II 2010   GERD (gastroesophageal reflux disease)    Headache    "maybe 3 times/wk" (05/24/2015)   History of hiatal hernia    History of stomach ulcers    HLD (hyperlipidemia)    Menopause    Migraine    "1-2/wk" (05/24/2015)   NSTEMI (non-ST elevated myocardial infarction) (HCC) 04/2022   OSA on CPAP    Stroke (HCC)    r sided weakness   Past Surgical History:  Procedure Laterality Date   ABDOMINAL HYSTERECTOMY  2007   BUNIONECTOMY Bilateral    CARPAL TUNNEL RELEASE Right    CORONARY STENT INTERVENTION N/A 05/07/2022   Procedure: CORONARY STENT INTERVENTION;  Surgeon: Lyn Records, MD;  Location: [facility];  Service: Cardiovascular;  Laterality: N/A;   HAMMER TOE SURGERY Left    LEFT HEART CATH AND CORONARY ANGIOGRAPHY N/A 05/20/2018   Procedure: LEFT HEART CATH AND CORONARY ANGIOGRAPHY;  Surgeon: Marykay Lex, MD;  Location: [facility] INVASIVE CV LAB;  Service: Cardiovascular;  Laterality: N/A;   LEFT HEART CATH AND CORONARY ANGIOGRAPHY N/A 05/07/2022   Procedure: LEFT HEART CATH AND CORONARY ANGIOGRAPHY;  Surgeon: Lyn Records, MD;  Location: [facility] INVAS[facility] CV [facility];  Service: Cardiovascular;  Laterality: N/A;    TONSILLECTOMY Bilateral 05/24/2015   Procedure: TONSILLECTOMY;  Surgeon: Christia Reading, MD;  Location: [facility];  Service: ENT;  Laterality: Bilateral;    Current Outpatient Medications:    clindamycin (CLEOCIN) 150 MG capsule, Take 1 capsule (150 mg total) by mouth 3 (three) times daily., Disp: 30 capsule, Rfl: 1   clindamycin (CLEOCIN) 150 MG capsule, Take 1 capsule (150 mg total) by mouth 3 (three) times daily., Disp: 30 capsule, Rfl: 1   Continuous Glucose Sensor (DEXCOM G6 SENSOR) MISC, SMARTSIG:1 Each Topical Every 10 Days, Disp: , Rfl:    Continuous Glucose Transmitter (DEXCOM G6 TRANSMITTER) MISC, USE AS DIRECTED EVERY 90 DAYS, Disp: , Rfl:    Acetaminophen (TYLENOL PO), Take 2 tablets by mouth 3 (three) times daily as needed (tooth ache)., Disp: , Rfl:    albuterol (PROVENTIL HFA;VENTOLIN HFA) 108 (90 Base) MCG/ACT inhaler, Inhale 1-2 puffs into the lungs every 6 (six) hours as needed for wheezing or shortness of breath., Disp: 1 Inhaler, Rfl: 2   alum & mag hydroxide-simeth (MAALOX Stephanie Thornton) 400-400-40 MG/5ML suspension, Take 15 mLs by mouth every 6 (six) hours as needed for indigestion., Disp: 355 mL, Rfl: 0   amitriptyline (ELAVIL) 150 MG tablet, Take 150 mg by mouth at bedtime., Disp: , Rfl:  amLODipine (NORVASC) 10 MG tablet, Take 1 tablet (10 mg total) by mouth daily., Disp: 90 tablet, Rfl: 2   aspirin 81 MG chewable tablet, Chew 1 tablet (81 mg total) by mouth daily., Disp: 30 tablet, Rfl: 3   baclofen (LIORESAL) 10 MG tablet, Take 10 mg by mouth 3 (three) times daily., Disp: , Rfl:    busPIRone (BUSPAR) 10 MG tablet, Take 10 mg by mouth 3 (three) times daily., Disp: , Rfl:    Cholecalciferol (DIALYVITE VITAMIN D3 Stephanie Thornton) 1.25 MG (50000 UT) TABS, Take 1 tablet by mouth once a week., Disp: 4 tablet, Rfl: 3   Cholecalciferol (VITAMIN D3) 1.25 MG (50000 UT) CAPS, Take 1 capsule by mouth once a week., Disp: , Rfl:    Cholecalciferol 1.25 MG (50000 UT) capsule, Take 1 capsule by mouth once a  week., Disp: 4 capsule, Rfl: 3   famotidine (PEPCID) 20 MG tablet, Take 1 tablet (20 mg total) by mouth 2 (two) times daily., Disp: 30 tablet, Rfl: 0   gabapentin (NEURONTIN) 300 MG capsule, Take 1 capsule (300 mg total) by mouth 3 (three) times daily., Disp: 270 capsule, Rfl: 0   gabapentin (NEURONTIN) 300 MG capsule, Take 1 capsule (300 mg total) by mouth 3 (three) times daily., Disp: 90 capsule, Rfl: 1   gabapentin (NEURONTIN) 300 MG capsule, Take 1 capsule (300 mg total) by mouth 3 (three) times daily., Disp: 90 capsule, Rfl: 1   hydrocortisone (ANUSOL-HC) 2.5 % rectal cream, Apply 1 Application topically 3 (three) times daily., Disp: , Rfl:    Insulin Pen Needle 31G X 5 MM MISC, 1 Units by Does not apply route 4 (four) times daily., Disp: 100 each, Rfl: 1   LATUDA 80 MG TABS tablet, Take 80 mg by mouth every evening., Disp: , Rfl:    LINZESS 290 MCG CAPS capsule, Take 290 mcg by mouth daily before breakfast., Disp: , Rfl:    lisinopril-hydrochlorothiazide (ZESTORETIC) 20-12.5 MG tablet, Take 1 tablet by mouth daily., Disp: , Rfl:    metoprolol tartrate (LOPRESSOR) 25 MG tablet, Take 1 tablet (25 mg total) by mouth 2 (two) times daily., Disp: 180 tablet, Rfl: 2   nitroGLYCERIN (NITROSTAT) 0.4 MG SL tablet, Place 1 tablet (0.4 mg total) under the tongue every 5 (five) minutes x 3 doses as needed for chest pain., Disp: 25 tablet, Rfl: 2   NOVOLOG FLEXPEN 100 UNIT/ML FlexPen, Inject 10-15 Units into the skin 3 (three) times daily as needed for high blood sugar. Sliding Scale, Disp: 15 mL, Rfl: 11   Oxycodone HCl 10 MG TABS, Take 10 mg by mouth 4 (four) times daily as needed (pain)., Disp: , Rfl:    pantoprazole (PROTONIX) 40 MG tablet, Take 1 tablet (40 mg total) by mouth daily., Disp: 30 tablet, Rfl: 0   potassium chloride SA (KLOR-CON M) 20 MEQ tablet, Take one tablet twice a day for five days, then take  one tablet every day., Disp: 35 tablet, Rfl: 0   rosuvastatin (CRESTOR) 20 MG tablet, Take 20  mg by mouth at bedtime., Disp: , Rfl:    sucralfate (CARAFATE) 1 g tablet, Take 1 g by mouth 2 (two) times daily., Disp: , Rfl:    SYMBICORT 160-4.5 MCG/ACT inhaler, Inhale 2 puffs into the lungs 2 (two) times daily., Disp: , Rfl:    topiramate (TOPAMAX) 200 MG tablet, Take 200 mg by mouth at bedtime., Disp: , Rfl:    traZODone (DESYREL) 100 MG tablet, Take 200 mg by mouth at bedtime.,  Disp: , Rfl:    TRESIBA FLEXTOUCH 100 UNIT/ML FlexTouch Pen, Inject 12 Units into the skin at bedtime., Disp: 15 mL, Rfl: 0  Allergies  Allergen Reactions   Lyrica [Pregabalin] Shortness Of Breath, Itching and Rash   Rocephin [Ceftriaxone Sodium In Dextrose] Shortness Of Breath   Sulfa Antibiotics Hives, Shortness Of Breath and Itching   Ultram [Tramadol] Hives and Shortness Of Breath    Had asthma attack   Vancomycin Anaphylaxis    Per Dr. Flonnie Overman   Glucotrol [Glipizide] Hives   Keflex [Cephalexin] Hives   Penicillins Hives    Has patient had a PCN reaction causing immediate rash, facial/tongue/throat swelling, SOB or lightheadedness with hypotension: YES Has patient had a PCN reaction causing severe rash involving mucus membranes or skin necrosis: NO Has patient had a PCN reaction that required hospitalization: YES Has patient had a PCN reaction occurring within the last 10 years: YES If all of the above answers are "NO", then may proceed with Cephalosporin use.   Janumet [Sitagliptin-Metformin Hcl] Nausea Only   Review of Systems Objective:  There were no vitals filed for this visit.  General: Well developed, nourished, in no acute distress, alert and oriented x3   Dermatological: Skin is warm, dry and supple bilateral. Nails x 10 are well maintained; remaining integument appears unremarkable at this time. There are no open sores, no preulcerative lesions, no rash or signs of infection present.  Vascular: Dorsalis Pedis artery and Posterior Tibial artery pedal pulses are 2/4 bilateral with immedate  capillary fill time. Pedal hair growth present. No varicosities and no lower extremity edema present bilateral.   Neruologic: Grossly intact via light touch bilateral. Vibratory intact via tuning fork bilateral. Protective threshold with Semmes Wienstein monofilament intact to all pedal sites bilateral. Patellar and Achilles deep tendon reflexes 2+ bilateral. No Babinski or clonus noted bilateral.   Musculoskeletal: No gross boney pedal deformities bilateral. No pain, crepitus, or limitation noted with foot and ankle range of motion bilateral. Muscular strength 5/5 in all groups tested bilateral.  Hammertoe deformity which was repaired with screw is now resulted in a rupture of the cortex with nail erythema and edema to the distal aspect of the toe and severe tenderness on palpation of the nail plate.  Gait: Unassisted, Nonantalgic.    Radiographs:  Radiographs taken today demonstrate osseously mature individual good bunion deformity second metatarsal repair hammertoe deformity with screw second right does demonstrate a cortex breach underneath the nail and the distal phalanx.  Assessment & Plan:   Assessment: Cellulitic infection second toe right foot.  Plan: Currently her hemoglobin A1c is a 14.5.  She also smokes 3 cigarettes a day.  We consented her for removal of the screw.  We will remove the screw as gently as possible there is small wound is possible.  She does understand that this is a high risk for smoker and the A1c this high however this is something that needs to be taking care of because of the potential for osteomyelitis and the loss of the toe or loss of the toe secondary to surgery or possibility that the toe may survive.  She understands this is amenable to it we did discuss the possible complications which may include but not limited to postop pain bleeding swell infection recurrence need for surgery overcorrection under correction loss of digit loss limb loss of life.  Signed all  consent forms and I will follow-up with her in the near future we did dispense a Darco  shoe which I would like for her to start wearing I also dispensed a prescription for clindamycin to be taken 3 times a day I will follow-up with her in 2 weeks if necessary otherwise I will see her in surgery     [address] [place], [place]

## 2023-09-11 ENCOUNTER — Other Ambulatory Visit: Payer: Self-pay | Admitting: Nurse Practitioner

## 2023-09-11 DIAGNOSIS — E042 Nontoxic multinodular goiter: Secondary | ICD-10-CM

## 2023-09-11 LAB — CULTURE, BLOOD (ROUTINE X 2): Culture: NO GROWTH

## 2023-09-18 ENCOUNTER — Ambulatory Visit
Admission: RE | Admit: 2023-09-18 | Discharge: 2023-09-18 | Disposition: A | Discharge status: Home / Self Care | Payer: Medicare HMO | Source: Ambulatory Visit | Attending: Nurse Practitioner | Admitting: Nurse Practitioner

## 2023-09-18 DIAGNOSIS — E042 Nontoxic multinodular goiter: Secondary | ICD-10-CM

## 2023-09-24 ENCOUNTER — Other Ambulatory Visit: Payer: Self-pay | Admitting: Podiatry

## 2023-09-24 MED ORDER — OXYCODONE-ACETAMINOPHEN 10-325 MG PO TABS
1.0000 | ORAL_TABLET | Freq: Three times a day (TID) | ORAL | 0 refills | Status: AC | PRN
Start: 2023-09-24 — End: 2023-10-01

## 2023-09-24 MED ORDER — CLINDAMYCIN HCL 150 MG PO CAPS: 150.0000 mg | ORAL_CAPSULE | Freq: Three times a day (TID) | ORAL | 0 refills | Status: DC

## 2023-09-24 MED ORDER — ONDANSETRON HCL 4 MG PO TABS: 4.0000 mg | ORAL_TABLET | Freq: Three times a day (TID) | ORAL | 0 refills | Status: DC | PRN

## 2023-09-25 ENCOUNTER — Encounter: Payer: Self-pay | Admitting: Podiatry

## 2023-09-26 DIAGNOSIS — Z4889 Encounter for other specified surgical aftercare: Secondary | ICD-10-CM | POA: Diagnosis not present

## 2023-10-02 ENCOUNTER — Ambulatory Visit: Payer: Medicare HMO

## 2023-10-02 ENCOUNTER — Ambulatory Visit (INDEPENDENT_AMBULATORY_CARE_PROVIDER_SITE_OTHER): Payer: Medicare HMO | Admitting: Podiatry

## 2023-10-02 ENCOUNTER — Encounter: Payer: Self-pay | Admitting: Podiatry

## 2023-10-02 DIAGNOSIS — T847XXA Infection and inflammatory reaction due to other internal orthopedic prosthetic devices, implants and grafts, initial encounter: Secondary | ICD-10-CM

## 2023-10-02 DIAGNOSIS — Z9889 Other specified postprocedural states: Secondary | ICD-10-CM

## 2023-10-02 NOTE — Progress Notes (Signed)
She presents today for her first postop visit regarding screw removal second digit of her right foot.  Denies fever chills nausea vomit muscle aches and pains.  Objective: Dressing once removed demonstrates no erythema edema cellulitis drainage or odor.  Sutures are in place margins well coapted.  Radiographs taken today demonstrate complete removal of the digital screw there is no signs of osseous breakdown or osteomyelitic change.  Assessment: Redressed the toe today continue to keep this clean and dry follow-up with me in 2 weeks for suture removal.  Continue use of the Darco shoe.

## 2023-10-08 ENCOUNTER — Inpatient Hospital Stay (HOSPITAL_COMMUNITY)
Admission: EM | Admit: 2023-10-08 | Discharge: 2023-10-10 | DRG: 642 | Disposition: A | Discharge status: Home / Self Care | Payer: Medicare HMO | Attending: Family Medicine | Admitting: Family Medicine

## 2023-10-08 ENCOUNTER — Emergency Department (HOSPITAL_COMMUNITY): Payer: Medicare HMO

## 2023-10-08 DIAGNOSIS — R4701 Aphasia: Secondary | ICD-10-CM | POA: Diagnosis present

## 2023-10-08 DIAGNOSIS — I10 Essential (primary) hypertension: Secondary | ICD-10-CM | POA: Diagnosis present

## 2023-10-08 DIAGNOSIS — F17210 Nicotine dependence, cigarettes, uncomplicated: Secondary | ICD-10-CM | POA: Diagnosis present

## 2023-10-08 DIAGNOSIS — Z882 Allergy status to sulfonamides status: Secondary | ICD-10-CM

## 2023-10-08 DIAGNOSIS — M19071 Primary osteoarthritis, right ankle and foot: Secondary | ICD-10-CM | POA: Diagnosis present

## 2023-10-08 DIAGNOSIS — E11649 Type 2 diabetes mellitus with hypoglycemia without coma: Secondary | ICD-10-CM | POA: Diagnosis present

## 2023-10-08 DIAGNOSIS — Z794 Long term (current) use of insulin: Secondary | ICD-10-CM

## 2023-10-08 DIAGNOSIS — G4733 Obstructive sleep apnea (adult) (pediatric): Secondary | ICD-10-CM | POA: Diagnosis present

## 2023-10-08 DIAGNOSIS — K219 Gastro-esophageal reflux disease without esophagitis: Secondary | ICD-10-CM | POA: Diagnosis present

## 2023-10-08 DIAGNOSIS — E785 Hyperlipidemia, unspecified: Principal | ICD-10-CM | POA: Diagnosis present

## 2023-10-08 DIAGNOSIS — Z818 Family history of other mental and behavioral disorders: Secondary | ICD-10-CM

## 2023-10-08 DIAGNOSIS — I69351 Hemiplegia and hemiparesis following cerebral infarction affecting right dominant side: Secondary | ICD-10-CM

## 2023-10-08 DIAGNOSIS — R2990 Unspecified symptoms and signs involving the nervous system: Secondary | ICD-10-CM

## 2023-10-08 DIAGNOSIS — M19072 Primary osteoarthritis, left ankle and foot: Secondary | ICD-10-CM | POA: Diagnosis present

## 2023-10-08 DIAGNOSIS — E876 Hypokalemia: Secondary | ICD-10-CM | POA: Diagnosis present

## 2023-10-08 DIAGNOSIS — Z955 Presence of coronary angioplasty implant and graft: Secondary | ICD-10-CM

## 2023-10-08 DIAGNOSIS — M479 Spondylosis, unspecified: Secondary | ICD-10-CM | POA: Diagnosis present

## 2023-10-08 DIAGNOSIS — R29710 NIHSS score 10: Secondary | ICD-10-CM | POA: Diagnosis present

## 2023-10-08 DIAGNOSIS — Z7982 Long term (current) use of aspirin: Secondary | ICD-10-CM

## 2023-10-08 DIAGNOSIS — R55 Syncope and collapse: Secondary | ICD-10-CM | POA: Diagnosis present

## 2023-10-08 DIAGNOSIS — G8929 Other chronic pain: Secondary | ICD-10-CM | POA: Diagnosis present

## 2023-10-08 DIAGNOSIS — I252 Old myocardial infarction: Secondary | ICD-10-CM

## 2023-10-08 DIAGNOSIS — Z8711 Personal history of peptic ulcer disease: Secondary | ICD-10-CM

## 2023-10-08 DIAGNOSIS — Z90710 Acquired absence of both cervix and uterus: Secondary | ICD-10-CM

## 2023-10-08 DIAGNOSIS — M170 Bilateral primary osteoarthritis of knee: Secondary | ICD-10-CM | POA: Diagnosis present

## 2023-10-08 DIAGNOSIS — Z888 Allergy status to other drugs, medicaments and biological substances status: Secondary | ICD-10-CM

## 2023-10-08 DIAGNOSIS — Z880 Allergy status to penicillin: Secondary | ICD-10-CM

## 2023-10-08 DIAGNOSIS — Z79899 Other long term (current) drug therapy: Secondary | ICD-10-CM

## 2023-10-08 DIAGNOSIS — R4020 Unspecified coma: Principal | ICD-10-CM

## 2023-10-08 DIAGNOSIS — Z8249 Family history of ischemic heart disease and other diseases of the circulatory system: Secondary | ICD-10-CM

## 2023-10-08 DIAGNOSIS — F319 Bipolar disorder, unspecified: Secondary | ICD-10-CM | POA: Diagnosis present

## 2023-10-08 DIAGNOSIS — Z881 Allergy status to other antibiotic agents status: Secondary | ICD-10-CM

## 2023-10-08 DIAGNOSIS — Z823 Family history of stroke: Secondary | ICD-10-CM

## 2023-10-08 DIAGNOSIS — Z7951 Long term (current) use of inhaled steroids: Secondary | ICD-10-CM

## 2023-10-08 DIAGNOSIS — I2510 Atherosclerotic heart disease of native coronary artery without angina pectoris: Secondary | ICD-10-CM | POA: Diagnosis present

## 2023-10-08 LAB — I-STAT CHEM 8, ED
BUN: 12 mg/dL (ref 6–20)
Calcium, Ion: 1.13 mmol/L — ABNORMAL LOW (ref 1.15–1.40)
Chloride: 107 mmol/L (ref 98–111)
Creatinine, Ser: 0.9 mg/dL (ref 0.44–1.00)
Glucose, Bld: 127 mg/dL — ABNORMAL HIGH (ref 70–99)
HCT: 40 % (ref 36.0–46.0)
Hemoglobin: 13.6 g/dL (ref 12.0–15.0)
Potassium: 3 mmol/L — ABNORMAL LOW (ref 3.5–5.1)
Sodium: 142 mmol/L (ref 135–145)
TCO2: 21 mmol/L — ABNORMAL LOW (ref 22–32)

## 2023-10-08 LAB — ETHANOL: Alcohol, Ethyl (B): <10 mg/dL (ref <10)

## 2023-10-08 LAB — CBC
HCT: 39.7 % (ref 36.0–46.0)
Hemoglobin: 12.9 g/dL (ref 12.0–15.0)
MCH: 28 pg (ref 26.0–34.0)
MCHC: 32.5 g/dL (ref 30.0–36.0)
MCV: 86.1 fL (ref 80.0–100.0)
Platelets: 232 10*3/uL (ref 150–400)
RBC: 4.61 MIL/uL (ref 3.87–5.11)
RDW: 15 % (ref 11.5–15.5)
WBC: 10.5 10*3/uL (ref 4.0–10.5)
nRBC: 0 % (ref 0.0–0.2)

## 2023-10-08 LAB — COMPREHENSIVE METABOLIC PANEL
ALT: 12 U/L (ref 0–44)
AST: 14 U/L — ABNORMAL LOW (ref 15–41)
Albumin: 3.4 g/dL — ABNORMAL LOW (ref 3.5–5.0)
Alkaline Phosphatase: 77 U/L (ref 38–126)
Anion gap: 16 — ABNORMAL HIGH (ref 5–15)
BUN: 12 mg/dL (ref 6–20)
CO2: 21 mmol/L — ABNORMAL LOW (ref 22–32)
Calcium: 9.4 mg/dL (ref 8.9–10.3)
Chloride: 106 mmol/L (ref 98–111)
Creatinine, Ser: 1.09 mg/dL — ABNORMAL HIGH (ref 0.44–1.00)
GFR, Estimated: >60 mL/min (ref >60)
Glucose, Bld: 127 mg/dL — ABNORMAL HIGH (ref 70–99)
Potassium: 3.1 mmol/L — ABNORMAL LOW (ref 3.5–5.1)
Sodium: 143 mmol/L (ref 135–145)
Total Bilirubin: 0.3 mg/dL (ref 0.3–1.2)
Total Protein: 6.5 g/dL (ref 6.5–8.1)

## 2023-10-08 LAB — DIFFERENTIAL
Abs Immature Granulocytes: 0.06 10*3/uL (ref 0.00–0.07)
Basophils Absolute: 0.1 10*3/uL (ref 0.0–0.1)
Basophils Relative: 1 %
Eosinophils Absolute: 0.5 10*3/uL (ref 0.0–0.5)
Eosinophils Relative: 5 %
Immature Granulocytes: 1 %
Lymphocytes Relative: 42 %
Lymphs Abs: 4.6 10*3/uL — ABNORMAL HIGH (ref 0.7–4.0)
Monocytes Absolute: 0.7 10*3/uL (ref 0.1–1.0)
Monocytes Relative: 7 %
Neutro Abs: 4.6 10*3/uL (ref 1.7–7.7)
Neutrophils Relative %: 44 %

## 2023-10-08 MED ORDER — IOHEXOL 350 MG/ML SOLN
75.0000 mL | Freq: Once | INTRAVENOUS | Status: AC | PRN
  Administered 2023-10-08: 75 mL via INTRAVENOUS
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
## 2023-10-08 NOTE — ED Provider Notes (Signed)
[place] EMERGENCY DEPARTMENT AT [facility] Provider Note   CSN: [id number] Arrival date & time: 10/08/23  2302  An emergency department physician performed an initial assessment on this suspected stroke patient at 2300.  History  Chief Complaint  Patient presents with   Code Stroke    Stephanie Thornton is a 52 y.o. female.  HPI     This is a 52 year old female with reported history of stroke who presents as a code stroke.  Last seen normal around 10 PM.  She was reportedly talking to family when she had an episode of loss of consciousness.  No shaking or seizure-like activity was observed.  Upon arrival, she was noted to have right-sided deficits specifically with lifting her right leg.  Also had some aphasia.  At baseline is reported to have some mild right-sided deficits but does not require any assistance with walking or ADLs.  Level 5 caveat  Home Medications Prior to Admission medications   Medication Sig Start Date End Date Taking? Authorizing Provider  Acetaminophen (TYLENOL PO) Take 2 tablets by mouth 3 (three) times daily as needed (tooth ache).    [provider]  albuterol (PROVENTIL HFA;VENTOLIN HFA) 108 (90 Base) MCG/ACT inhaler Inhale 1-2 puffs into the lungs every 6 (six) hours as needed for wheezing or shortness of breath. 03/04/19   Grayce Sessions, NP  alum & mag hydroxide-simeth (MAALOX MAX) 400-400-40 MG/5ML suspension Take 15 mLs by mouth every 6 (six) hours as needed for indigestion. 04/30/23   Elayne Snare K, DO  amitriptyline (ELAVIL) 150 MG tablet Take 150 mg by mouth at bedtime. 08/06/22   [provider]  amLODipine (NORVASC) 10 MG tablet Take 1 tablet (10 mg total) by mouth daily. 09/23/22   Quintella Reichert, MD  aspirin 81 MG chewable tablet Chew 1 tablet (81 mg total) by mouth daily. 05/09/22   Cyndi Bender, NP  baclofen (LIORESAL) 10 MG tablet Take 10 mg by mouth 3 (three) times daily. 05/05/22   [provider]   busPIRone (BUSPAR) 10 MG tablet Take 10 mg by mouth 3 (three) times daily. 01/25/19   [provider]  Cholecalciferol (DIALYVITE VITAMIN D3 MAX) 1.25 MG (50000 UT) TABS Take 1 tablet by mouth once a week. 10/08/22     Cholecalciferol (VITAMIN D3) 1.25 MG (50000 UT) CAPS Take 1 capsule by mouth once a week. 08/08/22   [provider]  Cholecalciferol 1.25 MG (50000 UT) capsule Take 1 capsule by mouth once a week. 11/11/22     clindamycin (CLEOCIN) 150 MG capsule Take 1 capsule (150 mg total) by mouth 3 (three) times daily. 09/09/23   Hyatt, Max T, DPM  clindamycin (CLEOCIN) 150 MG capsule Take 1 capsule (150 mg total) by mouth 3 (three) times daily. 09/09/23   Hyatt, Max T, DPM  clindamycin (CLEOCIN) 150 MG capsule Take 1 capsule (150 mg total) by mouth 3 (three) times daily. 09/24/23   Hyatt, Max T, DPM  Continuous Glucose Sensor (DEXCOM G6 SENSOR) MISC SMARTSIG:1 Each Topical Every 10 Days 09/07/23   [provider]  Continuous Glucose Transmitter (DEXCOM G6 TRANSMITTER) MISC USE AS DIRECTED EVERY 90 DAYS 09/05/23   [provider]  famotidine (PEPCID) 20 MG tablet Take 1 tablet (20 mg total) by mouth 2 (two) times daily. 04/30/23   Elayne Snare K, DO  gabapentin (NEURONTIN) 300 MG capsule Take 1 capsule (300 mg total) by mouth 3 (three) times daily. 06/29/19   Grayce Sessions,  NP  gabapentin (NEURONTIN) 300 MG capsule Take 1 capsule (300 mg total) by mouth 3 (three) times daily. 10/08/22     gabapentin (NEURONTIN) 300 MG capsule Take 1 capsule (300 mg total) by mouth 3 (three) times daily. 11/11/22     hydrocortisone (ANUSOL-HC) 2.5 % rectal cream Apply 1 Application topically 3 (three) times daily. 06/17/23   [provider]  Insulin Pen Needle 31G X 5 MM MISC 1 Units by Does not apply route 4 (four) times daily. 09/07/23   Noralee Stain, DO  LATUDA 80 MG TABS tablet Take 80 mg by mouth every evening. 02/01/19   [provider]  LINZESS 290 MCG  CAPS capsule Take 290 mcg by mouth daily before breakfast. 07/22/23   [provider]  lisinopril-hydrochlorothiazide (ZESTORETIC) 20-12.5 MG tablet Take 1 tablet by mouth daily.    [provider]  metoprolol tartrate (LOPRESSOR) 25 MG tablet Take 1 tablet (25 mg total) by mouth 2 (two) times daily. 09/23/22   Quintella Reichert, MD  nitroGLYCERIN (NITROSTAT) 0.4 MG SL tablet Place 1 tablet (0.4 mg total) under the tongue every 5 (five) minutes x 3 doses as needed for chest pain. 05/09/22   Cyndi Bender, NP  NOVOLOG FLEXPEN 100 UNIT/ML FlexPen Inject 10-15 Units into the skin 3 (three) times daily as needed for high blood sugar. Sliding Scale 09/07/23   Noralee Stain, DO  ondansetron (ZOFRAN) 4 MG tablet Take 1 tablet (4 mg total) by mouth every 8 (eight) hours as needed. 09/24/23   Hyatt, Max T, DPM  Oxycodone HCl 10 MG TABS Take 10 mg by mouth 4 (four) times daily as needed (pain). 07/08/22   [provider]  pantoprazole (PROTONIX) 40 MG tablet Take 1 tablet (40 mg total) by mouth daily. 10/30/22     potassium chloride SA (KLOR-CON M) 20 MEQ tablet Take one tablet twice a day for five days, then take  one tablet every day. 07/17/22   Dione Booze, MD  rosuvastatin (CRESTOR) 20 MG tablet Take 20 mg by mouth at bedtime. 07/29/23   [provider]  sucralfate (CARAFATE) 1 g tablet Take 1 g by mouth 2 (two) times daily. 04/02/23   [provider]  SYMBICORT 160-4.5 MCG/ACT inhaler Inhale 2 puffs into the lungs 2 (two) times daily. 03/16/23   [provider]  topiramate (TOPAMAX) 200 MG tablet Take 200 mg by mouth at bedtime. 01/28/19   [provider]  traZODone (DESYREL) 100 MG tablet Take 200 mg by mouth at bedtime. 02/24/19   [provider]  TRESIBA FLEXTOUCH 100 UNIT/ML FlexTouch Pen Inject 12 Units into the skin at bedtime. 09/07/23   Noralee Stain, DO      Allergies    Lyrica [pregabalin], Rocephin [ceftriaxone sodium in dextrose], Sulfa  antibiotics, Ultram [tramadol], Vancomycin, Glucotrol [glipizide], Keflex [cephalexin], Penicillins, and Janumet [sitagliptin-metformin hcl]    Review of Systems   Review of Systems  Unable to perform ROS: Acuity of condition    Physical Exam Updated Vital Signs BP 131/86 (BP Location: Right Arm)   Pulse 78   Temp 98.6 F (37 C) (Oral)   Resp 20   Ht 1.575 m (5\' 2" )   Wt 69.9 kg   SpO2 98%   BMI 28.19 kg/m  Physical Exam Vitals and nursing note reviewed.  Constitutional:      Appearance: She is well-developed.     Comments: Chronically ill-appearing but nontoxic  HENT:     Head: Normocephalic and atraumatic.  Mouth/Throat:     Mouth: Mucous membranes are moist.  Eyes:     Pupils: Pupils are equal, round, and reactive to light.  Cardiovascular:     Rate and Rhythm: Normal rate and regular rhythm.  Pulmonary:     Effort: Pulmonary effort is normal. No respiratory distress.  Abdominal:     Palpations: Abdomen is soft.  Musculoskeletal:     Cervical back: Neck supple.  Skin:    General: Skin is warm and dry.  Neurological:     Mental Status: She is alert.     Comments: Aphasia noted, right-sided lower extremity weakness most apparent     ED Results / Procedures / Treatments   Labs (all labs ordered are listed, but only abnormal results are displayed) Labs Reviewed  DIFFERENTIAL - Abnormal; Notable for the following components:      Result Value   Lymphs Abs 4.6 (*)    All other components within normal limits  COMPREHENSIVE METABOLIC PANEL - Abnormal; Notable for the following components:   Potassium 3.1 (*)    CO2 21 (*)    Glucose, Bld 127 (*)    Creatinine, Ser 1.09 (*)    Albumin 3.4 (*)    AST 14 (*)    Anion gap 16 (*)    All other components within normal limits  URINALYSIS, ROUTINE W REFLEX MICROSCOPIC - Abnormal; Notable for the following components:   Specific Gravity, Urine 1.034 (*)    All other components within normal limits  I-STAT CHEM  8, ED - Abnormal; Notable for the following components:   Potassium 3.0 (*)    Glucose, Bld 127 (*)    Calcium, Ion 1.13 (*)    TCO2 21 (*)    All other components within normal limits  CBG MONITORING, ED - Abnormal; Notable for the following components:   Glucose-Capillary 131 (*)    All other components within normal limits  ETHANOL  CBC  RAPID URINE DRUG SCREEN, HOSP PERFORMED  PROTIME-INR  APTT    EKG EKG Interpretation Date/Time:  Thursday October 09 2023 03:27:10 EDT Ventricular Rate:  73 PR Interval:  164 QRS Duration:  90 QT Interval:  402 QTC Calculation: 443 R Axis:   26  Text Interpretation: Sinus rhythm Minimal ST elevation, anterior leads Confirmed by Ross Marcus ([id number]) on 10/09/2023 3:47:28 AM  Radiology MR BRAIN WO CONTRAST  Result Date: 10/09/2023 CLINICAL DATA:  Initial evaluation for neuro deficit, stroke suspected. EXAM: MRI HEAD WITHOUT CONTRAST TECHNIQUE: Multiplanar, multiecho pulse sequences of the brain and surrounding structures were obtained without intravenous contrast. COMPARISON:  Prior CTs from 10/08/2023. FINDINGS: Brain: Cerebral volume within normal limits. Scattered patchy T2/FLAIR hyperintensity involving the periventricular and deep white matter both cerebral hemispheres, most like related chronic microvascular ischemic disease, mild in nature. Few scattered superimposed remote lacunar infarcts present about the left anterior corpus callosum, right lentiform nucleus, left thalamus, and left middle cerebellar peduncle. Chronic parietal infarct present at the left parietal lobe. Associated mild chronic hemosiderin staining and/or laminar necrosis at this location. No evidence for acute or subacute ischemia. Gray-white matter differentiation maintained. No other acute or chronic intracranial blood products. No mass lesion, midline shift or mass effect. No hydrocephalus or extra-axial fluid collection. 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 Vascular: Major intracranial vascular flow voids are maintained. Skull and upper cervical spine: Craniocervical junction within normal limits. Bone marrow signal intensity  normal. No scalp soft tissue abnormality. Sinuses/Orbits: Globes and orbital soft tissues within normal limits. Paranasal sinuses are largely clear. No significant mastoid effusion. Other: None. IMPRESSION: 1. No acute intracranial abnormality. 2. Chronic left parietal infarct. 3. Underlying mild chronic microvascular ischemic disease with a few scattered remote lacunar infarcts as above. 4. 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 Correlation with pituitary function tests suggested. Follow-up examination with nonemergent pituitary protocol MRI could be performed for further evaluation as warranted. Electronically Signed   By: Rise Mu M.D.   On: 10/09/2023 02:14   CT ANGIO HEAD NECK W WO CM (CODE STROKE)  Result Date: 10/09/2023 CLINICAL DATA:  Initial evaluation for neuro deficit, stroke suspected, right-sided weakness. EXAM: CT ANGIOGRAPHY HEAD AND NECK WITH AND WITHOUT CONTRAST TECHNIQUE: Multidetector CT imaging of the head and neck was performed using the standard protocol during bolus administration of intravenous contrast. Multiplanar CT image reconstructions and MIPs were obtained to evaluate the vascular anatomy. Carotid stenosis measurements (when applicable) are obtained utilizing NASCET criteria, using the distal internal carotid diameter as the denominator. RADIATION DOSE REDUCTION: This exam was performed according to the departmental dose-optimization program which includes automated exposure control, adjustment of the mA and/or kV according to patient size and/or use of iterative reconstruction technique.  CONTRAST:  75mL OMNIPAQUE IOHEXOL 350 MG/ML SOLN COMPARISON:  Prior CT from earlier same day as well as recent CTA from 09/06/2023 FINDINGS: CTA NECK FINDINGS Aortic arch: Visualized aortic arch within normal limits for caliber with standard branch pattern. Minor atheromatous change about the arch itself. No stenosis about the origin the great vessels. Right carotid system: Right common and internal carotid arteries are patent without dissection. Mild atheromatous change about the right carotid bulb without hemodynamically significant greater than 50% stenosis. Left carotid system: Left common and internal carotid arteries are patent without dissection. Mild atheromatous change about the left carotid bulb without hemodynamically significant greater than 50% stenosis. Vertebral arteries: Both vertebral arteries arise from subclavian arteries. No proximal subclavian artery stenosis. Proximal right vertebral artery not well seen or assessed due to adjacent venous contamination. Visualized vertebral arteries patent without stenosis or dissection. Skeleton: No discrete or worrisome osseous lesions. Other neck: No other acute finding within the neck. Multiple thyroid nodules noted, largest of which measures 1.9 cm on the left. These have been previously evaluated by ultrasound and biopsy (ref: J Am Coll Radiol. 2015 Feb;12(2): 143-50). Upper chest: Scattered atelectatic changes. No other acute finding. Emphysematous changes again noted. Review of the MIP images confirms the above findings CTA HEAD FINDINGS Anterior circulation: Atheromatous change about the carotid siphons with associated moderate multifocal narrowing, stable. A1 segments patent bilaterally. Normal anterior communicating artery complex. Anterior cerebral arteries patent without significant stenosis. No M1 stenosis or occlusion. Distal MCA branches perfused and symmetric. Posterior circulation: Both V4 segments patent without stenosis. Both PICA patent.  Basilar mildly irregular but patent without significant stenosis. Superior cerebral arteries patent bilaterally. Both PCAs supplied via the basilar. Atheromatous irregularity seen about the PCAs bilaterally with associated mild multifocal stenoses, stable. Venous sinuses: Patent allowing for timing the contrast bolus. Anatomic variants: None significant.  No aneurysm. Review of the MIP images confirms the above findings IMPRESSION: 1. Stable CTA as compared to 09/06/2023. No large vessel occlusion or other emergent finding. 2. Atheromatous change about the carotid siphons with associated moderate multifocal narrowing, stable. 3. Atheromatous irregularity about the PCAs bilaterally with  associated mild multifocal stenoses, stable. 4. Atheromatous change about the carotid bifurcations without hemodynamically significant greater than 50% stenosis. 5. Aortic Atherosclerosis (ICD10-I70.0) and Emphysema (ICD10-J43.9). These results were communicated to Dr. Iver Nestle at 11:56 pm on 10/08/2023 by text page via the [facility] messaging system. Electronically Signed   By: Rise Mu M.D.   On: 10/09/2023 00:00   CT HEAD CODE STROKE WO CONTRAST  Result Date: 10/08/2023 CLINICAL DATA:  Code stroke. Initial evaluation for neuro deficit, stroke suspected. Right-sided weakness. EXAM: CT HEAD WITHOUT CONTRAST TECHNIQUE: Contiguous axial images were obtained from the base of the skull through the vertex without intravenous contrast. RADIATION DOSE REDUCTION: This exam was performed according to the departmental dose-optimization program which includes automated exposure control, adjustment of the mA and/or kV according to patient size and/or use of iterative reconstruction technique. COMPARISON:  Prior study from 09/05/2023. FINDINGS: Brain: Cerebral volume within normal limits. Small chronic left parietal infarct noted. No acute intracranial hemorrhage. No acute large vessel territory infarct. No mass lesion or midline shift.  No hydrocephalus or extra-axial fluid collection. Vascular: No abnormal hyperdense vessel. Scattered vascular calcifications noted within the carotid siphons. Skull: Scalp soft tissues within normal limits.  Calvarium intact. Sinuses/Orbits: Globes and orbital soft tissues within normal limits. Visualized paranasal sinuses are clear. No mastoid effusion. Other: None. ASPECTS [facility] Stroke Program Early CT Score) - Ganglionic level infarction (caudate, lentiform nuclei, internal capsule, insula, M1-M3 cortex): 7 - Supraganglionic infarction (M4-M6 cortex): 3 Total score (0-10 with 10 being normal): 10 IMPRESSION: 1. No acute intracranial abnormality. 2. ASPECTS is 10. 3. Small remote left parietal infarct. Results were called by telephone at the time of interpretation on 10/08/2023 at 11:25 pm to provider Dr. Iver Nestle, Who verbally acknowledged these results. Electronically Signed   By: Rise Mu M.D.   On: 10/08/2023 23:25    Procedures .Critical Care  Performed by: Shon Baton, MD Authorized by: Shon Baton, MD   Critical care provider statement:    Critical care time (minutes):  31   Critical care was necessary to treat or prevent imminent or life-threatening deterioration of the following conditions: altered mental status.   Critical care was time spent personally by me on the following activities:  Development of treatment plan with patient or surrogate, discussions with consultants, evaluation of patient's response to treatment, examination of patient, ordering and review of laboratory studies, ordering and review of radiographic studies, ordering and performing treatments and interventions, pulse oximetry, re-evaluation of patient's condition and review of old charts     Medications Ordered in ED Medications  potassium chloride SA (KLOR-CON M) CR tablet 80 mEq (80 mEq Oral Patient Refused/Not Given 10/09/23 0336)  iohexol (OMNIPAQUE) 350 MG/ML injection 75 mL (75 mLs  Intravenous Contrast Given 10/08/23 2333)    ED Course/ Medical Decision Making/ A&P                                 Medical Decision Making Amount and/or Complexity of Data Reviewed Labs: ordered. Radiology: ordered.  Risk Prescription drug management. Decision regarding hospitalization.   This patient presents to the ED for concern of strokelike symptoms, this involves an extensive number of treatment options, and is a complaint that carries with it a high risk of complications and morbidity.  I considered the following differential and admission for this acute, potentially life threatening condition.  The differential diagnosis includes acute stroke, metabolic encephalopathy, hypoglycemia,  seizure, Todd's paralysis, syncope  MDM:    This a 52 year old female who initially presents as a code stroke.  She is nontoxic.  She does appear to have some right sided deficits.  After further investigation, it was noted that she was likely hypoglycemic at home.  CT and MRI imaging is not consistent with stroke.  Neurology recommends EEG and obs admission.  She is not orthostatic.  EKG is no ischemia or arrhythmia.  Labs notable for mild hypokalemia.  This was replaced.  Otherwise no obvious significant abnormalities.  Plan for admission for ongoing observation.  Thus far workup for syncope is reassuring.  (Labs, imaging, consults)  Labs: I Ordered, and personally interpreted labs.  The pertinent results include: CBC, CMP, EtOH  Imaging Studies ordered: I ordered imaging studies including CT head, CTA, MRI I independently visualized and interpreted imaging. I agree with the radiologist interpretation  Additional history obtained from EMS.  External records from outside source obtained and reviewed including prior evaluations  Cardiac Monitoring: The patient was maintained on a cardiac monitor.  If on the cardiac monitor, I personally viewed and interpreted the cardiac monitored which showed  an underlying rhythm of: Sinus rhythm  Reevaluation: After the interventions noted above, I reevaluated the patient and found that they have :stayed the same  Social Determinants of Health:  lives independently  Disposition: Discharge  Co morbidities that complicate the patient evaluation  Past Medical History:  Diagnosis Date   Anxiety    Arthritis    "knees, feet, back" (05/24/2015)   Benign essential HTN 05/01/2015   Bipolar disorder (HCC) diagnosed early 90s   CAD in native artery    Chronic bronchitis (HCC)    "get it q yr"   Chronic lower back pain    Depression    Diabetes mellitus type II 2010   GERD (gastroesophageal reflux disease)    Headache    "maybe 3 times/wk" (05/24/2015)   History of hiatal hernia    History of stomach ulcers    HLD (hyperlipidemia)    Menopause    Migraine    "1-2/wk" (05/24/2015)   NSTEMI (non-ST elevated myocardial infarction) (HCC) 04/2022   OSA on CPAP    Stroke (HCC)    r sided weakness     Medicines Meds ordered this encounter  Medications   iohexol (OMNIPAQUE) 350 MG/ML injection 75 mL   potassium chloride SA (KLOR-CON M) CR tablet 80 mEq    I have reviewed the patients home medicines and have made adjustments as needed  Problem List / ED Course: Problem List Items Addressed This Visit   None Visit Diagnoses     Loss of consciousness (HCC)    -  Primary   Stroke-like symptoms                       Final Clinical Impression(s) / ED Diagnoses Final diagnoses:  Loss of consciousness (HCC)  Stroke-like symptoms    Rx / DC Orders ED Discharge Orders     None         Champ Keetch, Mayer Masker, MD 10/09/23 [phone]

## 2023-10-09 ENCOUNTER — Observation Stay (HOSPITAL_COMMUNITY): Payer: Medicare HMO

## 2023-10-09 ENCOUNTER — Encounter (HOSPITAL_COMMUNITY): Payer: Self-pay

## 2023-10-09 ENCOUNTER — Other Ambulatory Visit: Payer: Self-pay

## 2023-10-09 DIAGNOSIS — F17210 Nicotine dependence, cigarettes, uncomplicated: Secondary | ICD-10-CM | POA: Diagnosis present

## 2023-10-09 DIAGNOSIS — Z7951 Long term (current) use of inhaled steroids: Secondary | ICD-10-CM | POA: Diagnosis not present

## 2023-10-09 DIAGNOSIS — I10 Essential (primary) hypertension: Secondary | ICD-10-CM | POA: Diagnosis present

## 2023-10-09 DIAGNOSIS — I252 Old myocardial infarction: Secondary | ICD-10-CM | POA: Diagnosis not present

## 2023-10-09 DIAGNOSIS — G8929 Other chronic pain: Secondary | ICD-10-CM | POA: Diagnosis present

## 2023-10-09 DIAGNOSIS — K219 Gastro-esophageal reflux disease without esophagitis: Secondary | ICD-10-CM | POA: Diagnosis present

## 2023-10-09 DIAGNOSIS — I69351 Hemiplegia and hemiparesis following cerebral infarction affecting right dominant side: Secondary | ICD-10-CM | POA: Diagnosis not present

## 2023-10-09 DIAGNOSIS — Z823 Family history of stroke: Secondary | ICD-10-CM | POA: Diagnosis not present

## 2023-10-09 DIAGNOSIS — R29710 NIHSS score 10: Secondary | ICD-10-CM | POA: Diagnosis present

## 2023-10-09 DIAGNOSIS — R55 Syncope and collapse: Secondary | ICD-10-CM | POA: Diagnosis present

## 2023-10-09 DIAGNOSIS — E876 Hypokalemia: Secondary | ICD-10-CM | POA: Diagnosis present

## 2023-10-09 DIAGNOSIS — M170 Bilateral primary osteoarthritis of knee: Secondary | ICD-10-CM | POA: Diagnosis present

## 2023-10-09 DIAGNOSIS — Z955 Presence of coronary angioplasty implant and graft: Secondary | ICD-10-CM | POA: Diagnosis not present

## 2023-10-09 DIAGNOSIS — E785 Hyperlipidemia, unspecified: Secondary | ICD-10-CM | POA: Diagnosis present

## 2023-10-09 DIAGNOSIS — R569 Unspecified convulsions: Secondary | ICD-10-CM | POA: Diagnosis not present

## 2023-10-09 DIAGNOSIS — R4701 Aphasia: Secondary | ICD-10-CM | POA: Diagnosis present

## 2023-10-09 DIAGNOSIS — F319 Bipolar disorder, unspecified: Secondary | ICD-10-CM | POA: Diagnosis present

## 2023-10-09 DIAGNOSIS — M19072 Primary osteoarthritis, left ankle and foot: Secondary | ICD-10-CM | POA: Diagnosis present

## 2023-10-09 DIAGNOSIS — Z8249 Family history of ischemic heart disease and other diseases of the circulatory system: Secondary | ICD-10-CM | POA: Diagnosis not present

## 2023-10-09 DIAGNOSIS — M19071 Primary osteoarthritis, right ankle and foot: Secondary | ICD-10-CM | POA: Diagnosis present

## 2023-10-09 DIAGNOSIS — E11649 Type 2 diabetes mellitus with hypoglycemia without coma: Secondary | ICD-10-CM | POA: Diagnosis present

## 2023-10-09 DIAGNOSIS — Z794 Long term (current) use of insulin: Secondary | ICD-10-CM | POA: Diagnosis not present

## 2023-10-09 DIAGNOSIS — I2510 Atherosclerotic heart disease of native coronary artery without angina pectoris: Secondary | ICD-10-CM | POA: Diagnosis present

## 2023-10-09 DIAGNOSIS — G4733 Obstructive sleep apnea (adult) (pediatric): Secondary | ICD-10-CM | POA: Diagnosis present

## 2023-10-09 DIAGNOSIS — M479 Spondylosis, unspecified: Secondary | ICD-10-CM | POA: Diagnosis present

## 2023-10-09 LAB — URINALYSIS, ROUTINE W REFLEX MICROSCOPIC
Bilirubin Urine: NEGATIVE
Glucose, UA: NEGATIVE mg/dL
Hgb urine dipstick: NEGATIVE
Ketones, ur: NEGATIVE mg/dL
Leukocytes,Ua: NEGATIVE
Nitrite: NEGATIVE
Protein, ur: NEGATIVE mg/dL
Specific Gravity, Urine: 1.034 — ABNORMAL HIGH (ref 1.005–1.030)
pH: 7 (ref 5.0–8.0)

## 2023-10-09 LAB — RAPID URINE DRUG SCREEN, HOSP PERFORMED
Amphetamines: NOT DETECTED
Barbiturates: NOT DETECTED
Benzodiazepines: NOT DETECTED
Cocaine: NOT DETECTED
Opiates: NOT DETECTED
Tetrahydrocannabinol: NOT DETECTED

## 2023-10-09 LAB — CBG MONITORING, ED
Glucose-Capillary: 131 mg/dL — ABNORMAL HIGH (ref 70–99)
Glucose-Capillary: 191 mg/dL — ABNORMAL HIGH (ref 70–99)
Glucose-Capillary: 256 mg/dL — ABNORMAL HIGH (ref 70–99)

## 2023-10-09 LAB — GLUCOSE, CAPILLARY
Glucose-Capillary: 259 mg/dL — ABNORMAL HIGH (ref 70–99)
Glucose-Capillary: 334 mg/dL — ABNORMAL HIGH (ref 70–99)

## 2023-10-09 LAB — TSH: TSH: 0.029 u[IU]/mL — ABNORMAL LOW (ref 0.350–4.500)

## 2023-10-09 LAB — T4, FREE: Free T4: 1.01 ng/dL (ref 0.61–1.12)

## 2023-10-09 LAB — MAGNESIUM: Magnesium: 1.5 mg/dL — ABNORMAL LOW (ref 1.7–2.4)

## 2023-10-09 LAB — PROTIME-INR
INR: 1.1 (ref 0.8–1.2)
Prothrombin Time: 14.5 s (ref 11.4–15.2)

## 2023-10-09 LAB — PHOSPHORUS: Phosphorus: 5 mg/dL — ABNORMAL HIGH (ref 2.5–4.6)

## 2023-10-09 LAB — CORTISOL-AM, BLOOD: Cortisol - AM: 21.1 ug/dL (ref 6.7–22.6)

## 2023-10-09 LAB — APTT: aPTT: 30 s (ref 24–36)

## 2023-10-09 MED ORDER — OXYCODONE HCL 5 MG PO TABS: 5.0000 mg | ORAL_TABLET | Freq: Four times a day (QID) | ORAL | Status: DC | PRN

## 2023-10-09 MED ORDER — INSULIN GLARGINE-YFGN 100 UNIT/ML ~~LOC~~ SOLN
12.0000 [IU] | Freq: Every day | SUBCUTANEOUS | Status: DC
  Administered 2023-10-09: 12 [IU] via SUBCUTANEOUS
  Filled 2023-10-09 (×2): qty 0.12

## 2023-10-09 MED ORDER — SODIUM CHLORIDE 0.9 % IV SOLN: INTRAVENOUS | Status: AC

## 2023-10-09 MED ORDER — MAGNESIUM SULFATE 2 GM/50ML IV SOLN
2.0000 g | Freq: Once | INTRAVENOUS | Status: AC
  Administered 2023-10-09: 2 g via INTRAVENOUS
  Filled 2023-10-09: qty 50

## 2023-10-09 MED ORDER — BUSPIRONE HCL 10 MG PO TABS
10.0000 mg | ORAL_TABLET | Freq: Three times a day (TID) | ORAL | Status: DC
  Administered 2023-10-09 – 2023-10-10 (×4): 10 mg via ORAL
  Filled 2023-10-09 (×4): qty 1

## 2023-10-09 MED ORDER — GABAPENTIN 300 MG PO CAPS
300.0000 mg | ORAL_CAPSULE | Freq: Three times a day (TID) | ORAL | Status: DC
  Administered 2023-10-09 – 2023-10-10 (×4): 300 mg via ORAL
  Filled 2023-10-09 (×3): qty 1

## 2023-10-09 MED ORDER — TOPIRAMATE 100 MG PO TABS
200.0000 mg | ORAL_TABLET | Freq: Every day | ORAL | Status: DC
  Administered 2023-10-09: 200 mg via ORAL
  Filled 2023-10-09: qty 2

## 2023-10-09 MED ORDER — INSULIN DEGLUDEC 100 UNIT/ML ~~LOC~~ SOPN: 12.0000 [IU] | PEN_INJECTOR | Freq: Every day | SUBCUTANEOUS | Status: DC

## 2023-10-09 MED ORDER — INSULIN ASPART 100 UNIT/ML IJ SOLN
0.0000 [IU] | Freq: Three times a day (TID) | INTRAMUSCULAR | Status: DC
  Administered 2023-10-09 (×2): 3 [IU] via SUBCUTANEOUS
  Administered 2023-10-09: 1 [IU] via SUBCUTANEOUS
  Administered 2023-10-10: 4 [IU] via SUBCUTANEOUS
  Administered 2023-10-10: 2 [IU] via SUBCUTANEOUS

## 2023-10-09 MED ORDER — SODIUM CHLORIDE 0.9% FLUSH
3.0000 mL | Freq: Two times a day (BID) | INTRAVENOUS | Status: DC
  Administered 2023-10-09 – 2023-10-10 (×2): 3 mL via INTRAVENOUS

## 2023-10-09 MED ORDER — METOPROLOL TARTRATE 25 MG PO TABS
25.0000 mg | ORAL_TABLET | Freq: Two times a day (BID) | ORAL | Status: DC
  Administered 2023-10-09 – 2023-10-10 (×3): 25 mg via ORAL
  Filled 2023-10-09 (×3): qty 1

## 2023-10-09 MED ORDER — POTASSIUM CHLORIDE 20 MEQ PO PACK
40.0000 meq | PACK | Freq: Once | ORAL | Status: AC
  Administered 2023-10-09: 40 meq via ORAL
  Filled 2023-10-09: qty 2

## 2023-10-09 MED ORDER — ROSUVASTATIN CALCIUM 20 MG PO TABS
20.0000 mg | ORAL_TABLET | Freq: Every day | ORAL | Status: DC
  Filled 2023-10-09: qty 1

## 2023-10-09 MED ORDER — POTASSIUM CHLORIDE 10 MEQ/100ML IV SOLN
10.0000 meq | INTRAVENOUS | Status: AC
  Administered 2023-10-09 (×4): 10 meq via INTRAVENOUS
  Filled 2023-10-09 (×4): qty 100

## 2023-10-09 MED ORDER — TRAZODONE HCL 100 MG PO TABS
200.0000 mg | ORAL_TABLET | Freq: Every day | ORAL | Status: DC
  Administered 2023-10-09: 200 mg via ORAL
  Filled 2023-10-09: qty 2

## 2023-10-09 MED ORDER — AMLODIPINE BESYLATE 10 MG PO TABS
10.0000 mg | ORAL_TABLET | Freq: Every day | ORAL | Status: DC
  Administered 2023-10-09 – 2023-10-10 (×2): 10 mg via ORAL
  Filled 2023-10-09: qty 1
  Filled 2023-10-09: qty 2

## 2023-10-09 MED ORDER — ACETAMINOPHEN 500 MG PO TABS
1000.0000 mg | ORAL_TABLET | Freq: Four times a day (QID) | ORAL | Status: DC | PRN
  Administered 2023-10-09: 1000 mg via ORAL
  Filled 2023-10-09: qty 2

## 2023-10-09 MED ORDER — ENOXAPARIN SODIUM 40 MG/0.4ML IJ SOSY
40.0000 mg | PREFILLED_SYRINGE | INTRAMUSCULAR | Status: DC
  Administered 2023-10-09 – 2023-10-10 (×2): 40 mg via SUBCUTANEOUS
  Filled 2023-10-09 (×2): qty 0.4

## 2023-10-09 MED ORDER — BACLOFEN 10 MG PO TABS
10.0000 mg | ORAL_TABLET | Freq: Three times a day (TID) | ORAL | Status: DC
  Administered 2023-10-09 – 2023-10-10 (×4): 10 mg via ORAL
  Filled 2023-10-09 (×4): qty 1

## 2023-10-09 MED ORDER — ASPIRIN 81 MG PO CHEW
81.0000 mg | CHEWABLE_TABLET | Freq: Every day | ORAL | Status: DC
  Administered 2023-10-09 – 2023-10-10 (×2): 81 mg via ORAL
  Filled 2023-10-09 (×2): qty 1

## 2023-10-09 MED ORDER — OXYCODONE HCL 5 MG PO TABS: 10.0000 mg | ORAL_TABLET | Freq: Four times a day (QID) | ORAL | Status: DC | PRN

## 2023-10-09 MED ORDER — POTASSIUM CHLORIDE CRYS ER 20 MEQ PO TBCR
80.0000 meq | EXTENDED_RELEASE_TABLET | Freq: Once | ORAL | Status: DC
  Filled 2023-10-09: qty 4

## 2023-10-09 MED ORDER — AMITRIPTYLINE HCL 25 MG PO TABS
150.0000 mg | ORAL_TABLET | Freq: Every day | ORAL | Status: DC
  Administered 2023-10-09: 150 mg via ORAL
  Filled 2023-10-09: qty 6

## 2023-10-09 MED ORDER — LURASIDONE HCL 40 MG PO TABS
80.0000 mg | ORAL_TABLET | Freq: Every evening | ORAL | Status: DC
  Administered 2023-10-09: 80 mg via ORAL
  Filled 2023-10-09 (×3): qty 2

## 2023-10-09 NOTE — ED Notes (Signed)
Called for transport

## 2023-10-09 NOTE — Progress Notes (Signed)
EEG complete - results pending 

## 2023-10-09 NOTE — ED Notes (Signed)
PT refused Potassium drink at this time

## 2023-10-09 NOTE — ED Triage Notes (Addendum)
PT was in bed at  home around 2200 when husband stated that she had a syncopal episode. Blood sugar at home was CBG:49 but EMS got a CBG of 126 when they arrived.Pt had some right sided deficit but symptoms were worse on the right side when she arrived, right sided leg drooping and some aphasia/slurring was noticed when speaking to her. Upon arrival to CT,PT's right side deficit became clearer and speech was less slurred.PT also has had recent toe surgery.

## 2023-10-09 NOTE — ED Notes (Signed)
EKG done by RN

## 2023-10-09 NOTE — Evaluation (Signed)
Physical Therapy Evaluation Patient Details Name: Stephanie Thornton MRN: [id number] DOB: 1971/09/13 Today's Date: 10/09/2023  History of Present Illness  Pt is 52 yo presenting to [facility] via EMS after episode of syncope. When EMS arrived pt was confused, speech was off and was not using her R lower extremity well. CT, CTA and MRI negative for acute stroke. 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 PMH: Anxeity, arthritis, HTN, Bipolar disorder, CAD, bronchitis, depression, LBP, DM II, GERD, HLD, Migraine, NSTEMI, OSA, Stroke.  Clinical Impression  Pt is presenting below baseline level of functioning. Currently pt is Min A for sitting EOB due to posterior lean and supervision for sit to stand due to symptomatic orthostatics. Pt will benefit from continued skilled physical therapy services in acute care hospital setting at this time to ensure pt returns home safely with needed DME if possible. Currently no recommended skilled physical therapy services recommended at this time following discharge from acute care hospital setting due to current level of function, home set up and available assistance at home. Pt was limited with mobility this session due to symptomatic drop in BP with standing.       If plan is discharge home, recommend the following: A little help with walking and/or transfers;Assistance with cooking/housework;Assist for transportation;Help with stairs or ramp for entrance     Equipment Recommendations None recommended by PT     Functional Status Assessment Patient has had a recent decline in their functional status and demonstrates the ability to make significant improvements in function in a reasonable and predictable amount of time.     Precautions / Restrictions Precautions Precautions: Fall Precaution Comments: watch BP Restrictions Weight Bearing Restrictions: No      Mobility  Bed Mobility Overal bed mobility: Needs Assistance Bed Mobility: Supine to Sit     Supine to sit: Min  assist     General bed mobility comments: Pt was Mod I getting to EOB but in sitting required intermittent Min A to prevent LOB posteriorly; denied dizziness.    Transfers Overall transfer level: Needs assistance Equipment used: None Transfers: Sit to/from Stand Sit to Stand: Supervision           General transfer comment: Supervision for sit to stand due to reported dizziness in standing after ~ 3 min with orthostatics.    Ambulation/Gait       General Gait Details: Did not progress gait today due to symptomatic orthostatics.  Stairs Stairs:  (not applicable pt has ramp)              Balance Overall balance assessment: Needs assistance Sitting-balance support: Single extremity supported, Bilateral upper extremity supported Sitting balance-Leahy Scale: Poor Sitting balance - Comments: Pt was leaning posteriorly requires some Min A to maintain upright. Pt states it is becuase of the bed and denied dizziness. Postural control: Posterior lean Standing balance support: Bilateral upper extremity supported Standing balance-Leahy Scale: Fair Standing balance comment: no LOB, supervision due to symptomatic orhtostatics.         Pertinent Vitals/Pain Pain Assessment Pain Assessment: No/denies pain    Home Living Family/patient expects to be discharged to:: Private residence Living Arrangements: Children (Son , DIL and grandkids) Available Help at Discharge: Family;Available 24 hours/day (DIL works from home) Type of Home: House Home Access: Ramped entrance       Home Layout: One level Home Equipment: None      Prior Function Prior Level of Function : Independent/Modified Independent       Mobility Comments: States  last year she was ambulating with rollator but currently not. ADLs Comments: Ind/Mod I. DIL assists with meds and pt manages her own finances. Son and family help with cooking, cleaning and laundry     Extremity/Trunk Assessment   Upper  Extremity Assessment Upper Extremity Assessment: Overall WFL for tasks assessed    Lower Extremity Assessment Lower Extremity Assessment: Overall WFL for tasks assessed    Cervical / Trunk Assessment Cervical / Trunk Assessment: Normal  Communication   Communication Communication: No apparent difficulties Cueing Techniques: Verbal cues  Cognition Arousal: Alert Behavior During Therapy: WFL for tasks assessed/performed Overall Cognitive Status: Within Functional Limits for tasks assessed          General Comments General comments (skin integrity, edema, etc.): Supine: 130/87, sitting: 125/87, standing 106/68        Assessment/Plan    PT Assessment Patient needs continued PT services  PT Problem List Decreased mobility       PT Treatment Interventions DME instruction;Therapeutic exercise;Gait training;Balance training;Functional mobility training;Therapeutic activities;Patient/family education    PT Goals (Current goals can be found in the Care Plan section)  Acute Rehab PT Goals Patient Stated Goal: to return home with family PT Goal Formulation: With patient Time For Goal Achievement: 10/23/23 Potential to Achieve Goals: Good    Frequency Min 1X/week        AM-PAC PT "6 Clicks" Mobility  Outcome Measure Help needed turning from your back to your side while in a flat bed without using bedrails?: None Help needed moving from lying on your back to sitting on the side of a flat bed without using bedrails?: A Little Help needed moving to and from a bed to a chair (including a wheelchair)?: A Little Help needed standing up from a chair using your arms (e.g., wheelchair or bedside chair)?: A Little Help needed to walk in hospital room?: A Little Help needed climbing 3-5 steps with a railing? : A Little 6 Click Score: 19    End of Session Equipment Utilized During Treatment: Gait belt Activity Tolerance: Other (comment) (Limited due to symptomatic  orthostatics) Patient left: in bed;with call bell/phone within reach Nurse Communication: Mobility status;Other (comment) (orthostatics) PT Visit Diagnosis: Other abnormalities of gait and mobility (R26.89)    Time: 1610-9604 PT Time Calculation (min) (ACUTE ONLY): 18 min   Charges:   PT Evaluation $PT Eval Low Complexity: 1 Low   PT General Charges $$ ACUTE PT VISIT: 1 Visit        Harrel Carina, DPT, CLT  Acute Rehabilitation Services Office: [phone] (Secure chat preferred)   Claudia Desanctis 10/09/2023, 10:54 AM

## 2023-10-09 NOTE — Consult Note (Incomplete)
Neurology Consultation Reason for Consult: *** Requesting Physician: ***  CC: ***  History is obtained from: Patient and chart review   HPI: Stephanie Thornton is a 52 y.o. woman with a past medical history significant for diabetes, hypertension, hyperlipidemia, migraine headaches, sleep apnea on CPAP, stroke with residual right-sided weakness, anxiety/depression/bipolar, ongoing tobacco abuse approximately 1 pack/day per family, coronary artery disease   LKW: *** Thrombolytic given?: No,   Examination was fluctuating and severity of baseline symptoms was unclear.  Also she had a relative contraindication to TNK due to recent toe surgery (though this would be a compressible site if she had a disabling deficit)  CTA prior to decision was obtained to rule out an incompletely compensated LVO  MRI brain prior to decision  Checklist of contraindications was reviewed and negative. Risks, benefits and alternatives were discussed *** IA performed?: No, no LVO Premorbid modified rankin scale:      1 - No significant disability. Able to carry out all usual activities, despite some symptoms.  ROS: All other review of systems was negative except as noted in the HPI. *** Unable to obtain due to altered mental status.   Past Medical History:  Diagnosis Date  . Anxiety   . Arthritis    "knees, feet, back" (05/24/2015)  . Benign essential HTN 05/01/2015  . Bipolar disorder (HCC) diagnosed early 49s  . CAD in native artery   . Chronic bronchitis (HCC)    "get it q yr"  . Chronic lower back pain   . Depression   . Diabetes mellitus type II 2010  . GERD (gastroesophageal reflux disease)   . Headache    "maybe 3 times/wk" (05/24/2015)  . History of hiatal hernia   . History of stomach ulcers   . HLD (hyperlipidemia)   . Menopause   . Migraine    "1-2/wk" (05/24/2015)  . NSTEMI (non-ST elevated myocardial infarction) (HCC) 04/2022  . OSA on CPAP   . Stroke [facility])    r sided weakness   Past  Surgical History:  Procedure Laterality Date  . ABDOMINAL HYSTERECTOMY  2007  . BUNIONECTOMY Bilateral   . CARPAL TUNNEL RELEASE Right   . CORONARY STENT INTERVENTION N/A 05/07/2022   Procedure: CORONARY STENT INTERVENTION;  Surgeon: Lyn Records, MD;  Location: [facility] INVASIVE CV LAB;  Service: Cardiovascular;  Laterality: N/A;  . HAMMER TOE SURGERY Left   . LEFT HEART CATH AND CORONARY ANGIOGRAPHY N/A 05/20/2018   Procedure: LEFT HEART CATH AND CORONARY ANGIOGRAPHY;  Surgeon: Marykay Lex, MD;  Location: [facility] INVASIVE CV LAB;  Service: Cardiovascular;  Laterality: N/A;  . LEFT HEART CATH AND CORONARY ANGIOGRAPHY N/A 05/07/2022   Procedure: LEFT HEART CATH AND CORONARY ANGIOGRAPHY;  Surgeon: Lyn Records, MD;  Location: [facility] INVASIVE CV LAB;  Service: Cardiovascular;  Laterality: N/A;  . TONSILLECTOMY Bilateral 05/24/2015   Procedure: TONSILLECTOMY;  Surgeon: Christia Reading, MD;  Location: [facility] OR;  Service: ENT;  Laterality: Bilateral;   Current Outpatient Medications  Medication Instructions  . Acetaminophen (TYLENOL PO) 2 tablets, Oral, 3 times daily PRN  . albuterol (PROVENTIL HFA;VENTOLIN HFA) 108 (90 Base) MCG/ACT inhaler 1-2 puffs, Inhalation, Every 6 hours PRN  . alum & mag hydroxide-simeth (MAALOX MAX) 400-400-40 MG/5ML suspension 15 mLs, Oral, Every 6 hours PRN  . amitriptyline (ELAVIL) 150 mg, Oral, Daily at bedtime  . amLODipine (NORVASC) 10 mg, Oral, Daily  . aspirin 81 mg, Oral, Daily  . baclofen (LIORESAL) 10 mg, Oral, 3 times daily  .  busPIRone (BUSPAR) 10 mg, Oral, 3 times daily  . Cholecalciferol (DIALYVITE VITAMIN D3 MAX) 1.25 MG (50000 UT) TABS 1 tablet, Oral, Weekly  . Cholecalciferol (VITAMIN D3) 1.25 MG (50000 UT) CAPS 1 capsule, Oral, Weekly  . Cholecalciferol 1.25 MG (50000 UT) capsule Take 1 capsule by mouth once a week.  . clindamycin (CLEOCIN) 150 mg, Oral, 3 times daily  . clindamycin (CLEOCIN) 150 mg, Oral, 3 times daily  . clindamycin (CLEOCIN) 150 mg, Oral, 3  times daily  . Continuous Glucose Sensor (DEXCOM G6 SENSOR) MISC SMARTSIG:1 Each Topical Every 10 Days  . Continuous Glucose Transmitter (DEXCOM G6 TRANSMITTER) MISC USE AS DIRECTED EVERY 90 DAYS  . famotidine (PEPCID) 20 mg, Oral, 2 times daily  . gabapentin (NEURONTIN) 300 mg, Oral, 3 times daily  . gabapentin (NEURONTIN) 300 mg, Oral, 3 times daily  . gabapentin (NEURONTIN) 300 mg, Oral, 3 times daily  . hydrocortisone (ANUSOL-HC) 2.5 % rectal cream 1 Application, Topical, 3 times daily  . Insulin Pen Needle 1 Units, Does not apply, 4 times daily  . Latuda 80 mg, Oral, Every evening  . Linzess 290 mcg, Oral, Daily before breakfast  . lisinopril-hydrochlorothiazide (ZESTORETIC) 20-12.5 MG tablet 1 tablet, Oral, Daily  . metoprolol tartrate (LOPRESSOR) 25 mg, Oral, 2 times daily  . nitroGLYCERIN (NITROSTAT) 0.4 mg, Sublingual, Every 5 min x3 PRN  . NovoLOG FlexPen 10-15 Units, Subcutaneous, 3 times daily PRN, Sliding Scale  . ondansetron (ZOFRAN) 4 mg, Oral, Every 8 hours PRN  . Oxycodone HCl 10 mg, Oral, 4 times daily PRN  . pantoprazole (PROTONIX) 40 mg, Oral, Daily  . potassium chloride SA (KLOR-CON M) 20 MEQ tablet Take one tablet twice a day for five days, then take  one tablet every day.  . rosuvastatin (CRESTOR) 20 mg, Oral, Daily at bedtime  . sucralfate (CARAFATE) 1 g, Oral, 2 times daily  . SYMBICORT 160-4.5 MCG/ACT inhaler 2 puffs, Inhalation, 2 times daily  . topiramate (TOPAMAX) 200 mg, Oral, Daily at bedtime  . traZODone (DESYREL) 200 mg, Oral, Daily at bedtime  . Evaristo Bury FlexTouch 12 Units, Subcutaneous, Daily at bedtime     Family History  Problem Relation Age of Onset  . Bipolar disorder Mother   . CVA Mother   . Bipolar disorder Son   . Depression Brother        schizoaffective d/o  . Hypertension Sister   . Heart disease Maternal Aunt   . Heart attack Maternal Aunt   . Hypertension Brother    ***  Social History:  reports that she quit smoking about 12  months ago. Her smoking use included cigarettes. She started smoking about 31 years ago. She has a 30 pack-year smoking history. She has never used smokeless tobacco. She reports that she does not drink alcohol and does not use drugs. ***  Exam: Current vital signs: Ht 5\' 2"  (1.575 m)   Wt 69.9 kg   BMI 28.17 kg/m  Vital signs in last 24 hours: Weight:  [69.9 kg] 69.9 kg (10/09 2300)   Physical Exam  Constitutional: Appears well-developed and well-nourished.  Psych: Affect appropriate to situation, *** Eyes: No scleral injection HENT: No oropharyngeal obstruction.  MSK: no joint deformities.  Cardiovascular: Normal rate and regular rhythm. *** Perfusing extremities well Respiratory: Effort normal, non-labored breathing GI: Soft.  No distension. There is no tenderness.  Skin: Warm dry and intact visible skin  Neuro: Mental Status: Patient is awake, alert, oriented to person, place, month, year, and situation.***  Patient is able to give a clear and coherent history.*** No signs of aphasia or neglect*** Cranial Nerves: II: Visual Fields are full. Pupils are equal, round, and reactive to light.  *** III,IV, VI: EOMI without ptosis or diploplia.  V: Facial sensation is symmetric to temperature VII: Facial movement is symmetric.  VIII: hearing is intact to voice X: Uvula elevates symmetrically XI: Shoulder shrug is symmetric. XII: tongue is midline without atrophy or fasciculations.  Motor: Tone is normal. Bulk is normal. 5/5 strength was present in all four extremities. *** Sensory: Sensation is symmetric to light touch and temperature in the arms and legs.*** Deep Tendon Reflexes: 2+ and symmetric in the brachioradialis and patellae. *** Plantars: Toes are downgoing bilaterally. *** Cerebellar: FNF and HKS are intact bilaterally*** Gait:  Deferred in acute setting ***  NIHSS total *** Score breakdown: *** Performed at *** time of patient arrival to ED    I have  reviewed labs in epic and the results pertinent to this consultation are:  Basic Metabolic Panel: Recent Labs  Lab 10/08/23 2314  NA 142  K 3.0*  CL 107  GLUCOSE 127*  BUN 12  CREATININE 0.90    CBC: Recent Labs  Lab 10/08/23 2307 10/08/23 2314  WBC 10.5  --   NEUTROABS 4.6  --   HGB 12.9 13.6  HCT 39.7 40.0  MCV 86.1  --   PLT 232  --     Coagulation Studies: No results for input(s): "LABPROT", "INR" in the last 72 hours.    I have reviewed the images obtained:***   Impression: ***  Recommendations: - ***   Brooke Dare MD-PhD [facility] [phone]   *** [facility], [facility], Teleneuro    Total critical care time: *** minutes   Critical care time was exclusive of separately billable procedures and treating other patients.   Critical care was necessary to treat or prevent imminent or life-threatening deterioration.   Critical care was time spent personally by me on the following activities: development of treatment plan with patient and/or surrogate as well as nursing, discussions with consultants/primary team, evaluation of patient's response to treatment, examination of patient, obtaining history from patient or surrogate, ordering and performing treatments and interventions, ordering and review of laboratory studies, ordering and review of radiographic studies, and re-evaluation of patient's condition as needed, as documented above.

## 2023-10-09 NOTE — ED Notes (Signed)
ED TO INPATIENT HANDOFF REPORT  ED Nurse Name and Phone #: Joneen Boers, Paramedic / [phone]  S Name/Age/Gender Stephanie Thornton 52 y.o. female Room/Bed: 010C/010C  Code Status   Code Status: Full Code  Home/SNF/Other Home Patient oriented to: self, place, time, and situation Is this baseline? Yes   Triage Complete: Triage complete  Chief Complaint Syncope [R55]  Triage Note PT was in bed at  home around 2200 when husband stated that she had a syncopal episode. Blood sugar at home was CBG:49 but EMS got a CBG of 126 when they arrived.Pt had some right sided deficit but symptoms were worse on the right side when she arrived, right sided leg drooping and some aphasia/slurring was noticed when speaking to her. Upon arrival to CT,PT's right side deficit became clearer and speech was less slurred.PT also has had recent toe surgery.   Allergies Allergies  Allergen Reactions   Lyrica [Pregabalin] Shortness Of Breath, Itching and Rash   Rocephin [Ceftriaxone Sodium In Dextrose] Shortness Of Breath   Sulfa Antibiotics Hives, Shortness Of Breath and Itching   Ultram [Tramadol] Hives and Shortness Of Breath    Had asthma attack   Vancomycin Anaphylaxis    Per Dr. Flonnie Overman   Glucotrol [Glipizide] Hives   Keflex [Cephalexin] Hives   Penicillins Hives    Has patient had a PCN reaction causing immediate rash, facial/tongue/throat swelling, SOB or lightheadedness with hypotension: YES Has patient had a PCN reaction causing severe rash involving mucus membranes or skin necrosis: NO Has patient had a PCN reaction that required hospitalization: YES Has patient had a PCN reaction occurring within the last 10 years: YES If all of the above answers are "NO", then may proceed with Cephalosporin use.   Janumet [Sitagliptin-Metformin Hcl] Nausea Only    Level of Care/Admitting Diagnosis ED Disposition     ED Disposition  Admit   Condition  --   Comment  Hospital Area: [facility] [[id number]]  Level of Care: Telemetry Medical [104]  May admit patient to [facility] or [facility] if equivalent level of care is available:: No  Covid Evaluation: Asymptomatic - no recent exposure (last 10 days) testing not required  Diagnosis: Syncope [206001]  Admitting Physician: Dolly Rias [[id number]]  Attending Physician: Chevis Pretty  Certification:: I certify this patient will need inpatient services for at least 2 midnights  Expected Medical Readiness: 10/10/2023          B Medical/Surgery History Past Medical History:  Diagnosis Date   Anxiety    Arthritis    "knees, feet, back" (05/24/2015)   Benign essential HTN 05/01/2015   Bipolar disorder (HCC) diagnosed early 90s   CAD in native artery    Chronic bronchitis (HCC)    "get it q yr"   Chronic lower back pain    Depression    Diabetes mellitus type II 2010   GERD (gastroesophageal reflux disease)    Headache    "maybe 3 times/wk" (05/24/2015)   History of hiatal hernia    History of stomach ulcers    HLD (hyperlipidemia)    Menopause    Migraine    "1-2/wk" (05/24/2015)   NSTEMI (non-ST elevated myocardial infarction) (HCC) 04/2022   OSA on CPAP    Stroke (HCC)    r sided weakness   Past Surgical History:  Procedure Laterality Date   ABDOMINAL HYSTERECTOMY  2007   BUNIONECTOMY Bilateral    CARPAL TUNNEL RELEASE Right    CORONARY  STENT INTERVENTION N/A 05/07/2022   Procedure: CORONARY STENT INTERVENTION;  Surgeon: Lyn Records, MD;  Location[facility]VASIVE CV LAB;  Service: Cardiovascular;  Laterality: N/A;   HAMMER TOE SURGERY Left    LEFT HEART CATH AND CORONARY ANGIOGRAPHY N/A 05/20/2018   Procedure: LEFT HEART CATH AND CORONARY ANGIOGRAPHY;  Surgeon: Marykay Lex, MD;  Location: [facility] INVASIVE CV LAB;  Service: Cardiovascular;  Laterality: N/A;   LEFT HEART CATH AND CORONARY ANGIOGRAPHY N/A 05/07/2022   Procedure: LEFT HEART CATH AND CORONARY ANGIOGRAPHY;  Surgeon: Lyn Records, MD;  Location: [facility] INVASIVE CV LAB;  Service: Cardiovascular;  Laterality: N/A;   TONSILLECTOMY Bilateral 05/24/2015   Procedure: TONSILLECTOMY;  Surgeon: Christia Reading, MD;  Location: [facility] OR;  Service: ENT;  Laterality: Bilateral;     A IV Location/Drains/Wounds Patient Lines/Drains/Airways Status     Active Line/Drains/Airways     Name Placement date Placement time Site Days   Peripheral IV 10/08/23 20 G Anterior;Right Forearm 10/08/23  2330  Forearm  1            Intake/Output Last 24 hours  Intake/Output Summary (Last 24 hours) at 10/09/2023 1347 Last data filed at 10/09/2023 1152 Gross per 24 hour  Intake 708.08 ml  Output --  Net 708.08 ml    Labs/Imaging Results for orders placed or performed during the hospital encounter of 10/08/23 (from the past 48 hour(s))  Phosphorus     Status: Abnormal   Collection Time: 10/08/23 11:05 PM  Result Value Ref Range   Phosphorus 5.0 (H) 2.5 - 4.6 mg/dL    Comment: Performed at [facility], [address] [place], [place] [address]  CBC     Status: None   Collection Time: 10/08/23 11:07 PM  Result Value Ref Range   WBC 10.5 4.0 - 10.5 K/uL   RBC 4.61 3.87 - 5.11 MIL/uL   Hemoglobin 12.9 12.0 - 15.0 g/dL   HCT 84.6 96.2 - 95.2 %   MCV 86.1 80.0 - 100.0 fL   MCH 28.0 26.0 - 34.0 pg   MCHC 32.5 30.0 - 36.0 g/dL   RDW 84.1 32.4 - 40.1 %   Platelets 232 150 - 400 K/uL   nRBC 0.0 0.0 - 0.2 %    Comment: Performed at [facility], [address] [place], [place] [address]  Differential     Status: Abnormal   Collection Time: 10/08/23 11:07 PM  Result Value Ref Range   Neutrophils Relative % 44 %   Neutro Abs 4.6 1.7 - 7.7 K/uL   Lymphocytes Relative 42 %   Lymphs Abs 4.6 (H) 0.7 - 4.0 K/uL   Monocytes Relative 7 %   Monocytes Absolute 0.7 0.1 - 1.0 K/uL   Eosinophils Relative 5 %   Eosinophils Absolute 0.5 0.0 - 0.5 K/uL   Basophils Relative 1 %   Basophils Absolute 0.1 0.0 - 0.1 K/uL   Immature  Granulocytes 1 %   Abs Immature Granulocytes 0.06 0.00 - 0.07 K/uL    Comment: Performed at [facility], [address], [place], [place] [address]  Comprehensive metabolic panel     Status: Abnormal   Collection Time: 10/08/23 11:07 PM  Result Value Ref Range   Sodium 143 135 - 145 mmol/L   Potassium 3.1 (L) 3.5 - 5.1 mmol/L   Chloride 106 98 - 111 mmol/L   CO2 21 (L) 22 - 32 mmol/L   Glucose, Bld 127 (H) 70 -  99 mg/dL    Comment: Glucose reference range applies only to samples taken after fasting for at least 8 hours.   BUN 12 6 - 20 mg/dL   Creatinine, Ser 1.61 (H) 0.44 - 1.00 mg/dL   Calcium 9.4 8.9 - 09.6 mg/dL   Total Protein 6.5 6.5 - 8.1 g/dL   Albumin 3.4 (L) 3.5 - 5.0 g/dL   AST 14 (L) 15 - 41 U/L   ALT 12 0 - 44 U/L   Alkaline Phosphatase 77 38 - 126 U/L   Total Bilirubin 0.3 0.3 - 1.2 mg/dL   GFR, Estimated >04 >54 mL/min    Comment: (NOTE) Calculated using the CKD-EPI Creatinine Equation (2021)    Anion gap 16 (H) 5 - 15    Comment: Performed at [facility], [address] [place], [place] [address]  Magnesium     Status: Abnormal   Collection Time: 10/08/23 11:07 PM  Result Value Ref Range   Magnesium 1.5 (L) 1.7 - 2.4 mg/dL    Comment: Performed at [facility], [address] [place], [place] [address]  Ethanol     Status: None   Collection Time: 10/08/23 11:11 PM  Result Value Ref Range   Alcohol, Ethyl (B) <10 <10 mg/dL    Comment: (NOTE) Lowest detectable limit for serum alcohol is 10 mg/dL.  For medical purposes only. Performed at [facility], [address] [place], [place] [address]   I-stat chem 8, ED     Status: Abnormal   Collection Time: 10/08/23 11:14 PM  Result Value Ref Range   Sodium 142 135 - 145 mmol/L   Potassium 3.0 (L) 3.5 - 5.1 mmol/L   Chloride 107 98 - 111 mmol/L   BUN 12 6 - 20 mg/dL   Creatinine, Ser 1.30 0.44 - 1.00 mg/dL   Glucose, Bld 865 (H) 70 - 99 mg/dL    Comment: Glucose reference range  applies only to samples taken after fasting for at least 8 hours.   Calcium, Ion 1.13 (L) 1.15 - 1.40 mmol/L   TCO2 21 (L) 22 - 32 mmol/L   Hemoglobin 13.6 12.0 - 15.0 g/dL   HCT 78.4 69.6 - 29.5 %  CBG monitoring, ED     Status: Abnormal   Collection Time: 10/09/23 12:05 AM  Result Value Ref Range   Glucose-Capillary 131 (H) 70 - 99 mg/dL    Comment: Glucose reference range applies only to samples taken after fasting for at least 8 hours.  Urine rapid drug screen (hosp performed)     Status: None   Collection Time: 10/09/23  3:03 AM  Result Value Ref Range   Opiates NONE DETECTED NONE DETECTED   Cocaine NONE DETECTED NONE DETECTED   Benzodiazepines NONE DETECTED NONE DETECTED   Amphetamines NONE DETECTED NONE DETECTED   Tetrahydrocannabinol NONE DETECTED NONE DETECTED   Barbiturates NONE DETECTED NONE DETECTED    Comment: (NOTE) DRUG SCREEN FOR MEDICAL PURPOSES ONLY.  IF CONFIRMATION IS NEEDED FOR ANY PURPOSE, NOTIFY LAB WITHIN 5 DAYS.  LOWEST DETECTABLE LIMITS FOR URINE DRUG SCREEN Drug Class                     Cutoff (ng/mL) Amphetamine and metabolites    1000 Barbiturate and metabolites    200 Benzodiazepine                 200 Opiates and metabolites        300  Cocaine and metabolites        300 THC                            50 Performed at [facility], [address] [place], [place] [address]   Urinalysis, Routine w reflex microscopic -Urine, Clean Catch     Status: Abnormal   Collection Time: 10/09/23  3:03 AM  Result Value Ref Range   Color, Urine YELLOW YELLOW   APPearance CLEAR CLEAR   Specific Gravity, Urine 1.034 (H) 1.005 - 1.030   pH 7.0 5.0 - 8.0   Glucose, UA NEGATIVE NEGATIVE mg/dL   Hgb urine dipstick NEGATIVE NEGATIVE   Bilirubin Urine NEGATIVE NEGATIVE   Ketones, ur NEGATIVE NEGATIVE mg/dL   Protein, ur NEGATIVE NEGATIVE mg/dL   Nitrite NEGATIVE NEGATIVE   Leukocytes,Ua NEGATIVE NEGATIVE    Comment: Performed at [facility]  Lab, [address], [place], [place] [address]  Cortisol-am, blood     Status: None   Collection Time: 10/09/23  6:32 AM  Result Value Ref Range   Cortisol - AM 21.1 6.7 - 22.6 ug/dL    Comment: Performed at [facility], [address] [place], [place] [address]  TSH     Status: Abnormal   Collection Time: 10/09/23  6:32 AM  Result Value Ref Range   TSH 0.029 (L) 0.350 - 4.500 uIU/mL    Comment: Performed by a 3rd Generation assay with a functional sensitivity of <=0.01 uIU/mL. Performed at [facility], [address], [place], [place] [address]   T4, free     Status: None   Collection Time: 10/09/23  6:32 AM  Result Value Ref Range   Free T4 1.01 0.61 - 1.12 ng/dL    Comment: (NOTE) Biotin ingestion may interfere with free T4 tests. If the results are inconsistent with the TSH level, previous test results, or the clinical presentation, then consider biotin interference. If needed, order repeat testing after stopping biotin. Performed at [facility], [address], [place], [place] [address]   CBG monitoring, ED     Status: Abnormal   Collection Time: 10/09/23  8:13 AM  Result Value Ref Range   Glucose-Capillary 191 (H) 70 - 99 mg/dL    Comment: Glucose reference range applies only to samples taken after fasting for at least 8 hours.  CBG monitoring, ED     Status: Abnormal   Collection Time: 10/09/23 11:49 AM  Result Value Ref Range   Glucose-Capillary 256 (H) 70 - 99 mg/dL    Comment: Glucose reference range applies only to samples taken after fasting for at least 8 hours.   EEG adult  Result Date: 10/09/2023 Charlsie Quest, MD     10/09/2023  7:13 AM Patient Name: Kelcie Currie MRN: [id number] Epilepsy Attending: Charlsie Quest Referring Physician/Provider: Gordy Councilman, MD Date: 10/09/2023 Duration: 27.24 mins Patient history: 52yo F with syncope getting eeg to evaluate for seizure Level of alertness: Awake, asleep AEDs during EEG study: TPM,  GBP Technical aspects: This EEG study was done with scalp electrodes positioned according to the 10-20 International system of electrode placement. Electrical activity was reviewed with band pass filter of 1-70Hz , sensitivity of 7 uV/mm, display speed of 90mm/sec with a 60Hz  notched filter applied as appropriate. EEG data were recorded continuously and digitally stored.  Video monitoring was available and reviewed as appropriate. Description:  The posterior dominant rhythm consists of 8 Hz activity of moderate voltage (25-35 uV) seen predominantly in posterior head regions, symmetric and reactive to eye opening and eye closing. Sleep was characterized by vertex waves, sleep spindles (12 to 14 Hz), maximal frontocentral region. EEG showed continuous generalized 3 to 5 Hz theta-delta slowing. Hyperventilation and photic stimulation were not performed.   ABNORMALITY - Continuous slow, generalized IMPRESSION: This study is suggestive of moderate diffuse encephalopathy. No seizures or epileptiform discharges were seen throughout the recording. Charlsie Quest   MR BRAIN WO CONTRAST  Result Date: 10/09/2023 CLINICAL DATA:  Initial evaluation for neuro deficit, stroke suspected. EXAM: MRI HEAD WITHOUT CONTRAST TECHNIQUE: Multiplanar, multiecho pulse sequences of the brain and surrounding structures were obtained without intravenous contrast. COMPARISON:  Prior CTs from 10/08/2023. FINDINGS: Brain: Cerebral volume within normal limits. Scattered patchy T2/FLAIR hyperintensity involving the periventricular and deep white matter both cerebral hemispheres, most like related chronic microvascular ischemic disease, mild in nature. Few scattered superimposed remote lacunar infarcts present about the left anterior corpus callosum, right lentiform nucleus, left thalamus, and left middle cerebellar peduncle. Chronic parietal infarct present at the left parietal lobe. Associated mild chronic hemosiderin staining and/or laminar  necrosis at this location. No evidence for acute or subacute ischemia. Gray-white matter differentiation maintained. No other acute or chronic intracranial blood products. No mass lesion, midline shift or mass effect. No hydrocephalus or extra-axial fluid collection. 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 Vascular: Major intracranial vascular flow voids are maintained. Skull and upper cervical spine: Craniocervical junction within normal limits. Bone marrow signal intensity normal. No scalp soft tissue abnormality. Sinuses/Orbits: Globes and orbital soft tissues within normal limits. Paranasal sinuses are largely clear. No significant mastoid effusion. Other: None. IMPRESSION: 1. No acute intracranial abnormality. 2. Chronic left parietal infarct. 3. Underlying mild chronic microvascular ischemic disease with a few scattered remote lacunar infarcts as above. 4. 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 Correlation with pituitary function tests suggested. Follow-up examination with nonemergent pituitary protocol MRI could be performed for further evaluation as warranted. Electronically Signed   By: Rise Mu M.D.   On: 10/09/2023 02:14   CT ANGIO HEAD NECK W WO CM (CODE STROKE)  Result Date: 10/09/2023 CLINICAL DATA:  Initial evaluation for neuro deficit, stroke suspected, right-sided weakness. EXAM: CT ANGIOGRAPHY HEAD AND NECK WITH AND WITHOUT CONTRAST TECHNIQUE: Multidetector CT imaging of the head and neck was performed using the standard protocol during bolus administration of intravenous contrast. Multiplanar CT image reconstructions and MIPs were obtained to evaluate the vascular anatomy. Carotid stenosis measurements (when applicable) are obtained utilizing NASCET criteria, using the  distal internal carotid diameter as the denominator. RADIATION DOSE REDUCTION: This exam was performed according to the departmental dose-optimization program which includes automated exposure control, adjustment of the mA and/or kV according to patient size and/or use of iterative reconstruction technique. CONTRAST:  75mL OMNIPAQUE IOHEXOL 350 MG/ML SOLN COMPARISON:  Prior CT from earlier same day as well as recent CTA from 09/06/2023 FINDINGS: CTA NECK FINDINGS Aortic arch: Visualized aortic arch within normal limits for caliber with standard branch pattern. Minor atheromatous change about the arch itself. No stenosis about the origin the great vessels. Right carotid system: Right common and internal carotid arteries are patent without dissection. Mild atheromatous change about the right carotid bulb without  hemodynamically significant greater than 50% stenosis. Left carotid system: Left common and internal carotid arteries are patent without dissection. Mild atheromatous change about the left carotid bulb without hemodynamically significant greater than 50% stenosis. Vertebral arteries: Both vertebral arteries arise from subclavian arteries. No proximal subclavian artery stenosis. Proximal right vertebral artery not well seen or assessed due to adjacent venous contamination. Visualized vertebral arteries patent without stenosis or dissection. Skeleton: No discrete or worrisome osseous lesions. Other neck: No other acute finding within the neck. Multiple thyroid nodules noted, largest of which measures 1.9 cm on the left. These have been previously evaluated by ultrasound and biopsy (ref: J Am Coll Radiol. 2015 Feb;12(2): 143-50). Upper chest: Scattered atelectatic changes. No other acute finding. Emphysematous changes again noted. Review of the MIP images confirms the above findings CTA HEAD FINDINGS Anterior circulation: Atheromatous change about the carotid siphons with associated moderate multifocal narrowing,  stable. A1 segments patent bilaterally. Normal anterior communicating artery complex. Anterior cerebral arteries patent without significant stenosis. No M1 stenosis or occlusion. Distal MCA branches perfused and symmetric. Posterior circulation: Both V4 segments patent without stenosis. Both PICA patent. Basilar mildly irregular but patent without significant stenosis. Superior cerebral arteries patent bilaterally. Both PCAs supplied via the basilar. Atheromatous irregularity seen about the PCAs bilaterally with associated mild multifocal stenoses, stable. Venous sinuses: Patent allowing for timing the contrast bolus. Anatomic variants: None significant.  No aneurysm. Review of the MIP images confirms the above findings IMPRESSION: 1. Stable CTA as compared to 09/06/2023. No large vessel occlusion or other emergent finding. 2. Atheromatous change about the carotid siphons with associated moderate multifocal narrowing, stable. 3. Atheromatous irregularity about the PCAs bilaterally with associated mild multifocal stenoses, stable. 4. Atheromatous change about the carotid bifurcations without hemodynamically significant greater than 50% stenosis. 5. Aortic Atherosclerosis (ICD10-I70.0) and Emphysema (ICD10-J43.9). These results were communicated to Dr. Iver Nestle at 11:56 pm on 10/08/2023 by text page via the [facility] messaging system. Electronically Signed   By: Rise Mu M.D.   On: 10/09/2023 00:00   CT HEAD CODE STROKE WO CONTRAST  Result Date: 10/08/2023 CLINICAL DATA:  Code stroke. Initial evaluation for neuro deficit, stroke suspected. Right-sided weakness. EXAM: CT HEAD WITHOUT CONTRAST TECHNIQUE: Contiguous axial images were obtained from the base of the skull through the vertex without intravenous contrast. RADIATION DOSE REDUCTION: This exam was performed according to the departmental dose-optimization program which includes automated exposure control, adjustment of the mA and/or kV according to  patient size and/or use of iterative reconstruction technique. COMPARISON:  Prior study from 09/05/2023. FINDINGS: Brain: Cerebral volume within normal limits. Small chronic left parietal infarct noted. No acute intracranial hemorrhage. No acute large vessel territory infarct. No mass lesion or midline shift. No hydrocephalus or extra-axial fluid collection. Vascular: No abnormal hyperdense vessel. Scattered vascular calcifications noted within the carotid siphons. Skull: Scalp soft tissues within normal limits.  Calvarium intact. Sinuses/Orbits: Globes and orbital soft tissues within normal limits. Visualized paranasal sinuses are clear. No mastoid effusion. Other: None. ASPECTS [facility] Stroke Program Early CT Score) - Ganglionic level infarction (caudate, lentiform nuclei, internal capsule, insula, M1-M3 cortex): 7 - Supraganglionic infarction (M4-M6 cortex): 3 Total score (0-10 with 10 being normal): 10 IMPRESSION: 1. No acute intracranial abnormality. 2. ASPECTS is 10. 3. Small remote left parietal infarct. Results were called by telephone at the time of interpretation on 10/08/2023 at 11:25 pm to provider Dr. Iver Nestle, Who verbally acknowledged these results. Electronically Signed   By: Janell Quiet.D.  On: 10/08/2023 23:25    Pending Labs Unresulted Labs (From admission, onward)     Start     Ordered   10/08/23 2330  Protime-INR  Once,   STAT        10/08/23 2330   10/08/23 2330  APTT  Once,   STAT        10/08/23 2330            Vitals/Pain Today's Vitals   10/09/23 1100 10/09/23 1145 10/09/23 1152 10/09/23 1330  BP: 111/74 (!) 147/84  125/70  Pulse: 77 81  73  Resp: 16 (!) 23  16  Temp:      TempSrc:      SpO2: 100% 100%  100%  Weight:      Height:      PainSc:   0-No pain     Isolation Precautions No active isolations  Medications Medications  enoxaparin (LOVENOX) injection 40 mg (40 mg Subcutaneous Given 10/09/23 0921)  sodium chloride flush (NS) 0.9 %  injection 3 mL (3 mLs Intravenous Not Given 10/09/23 0924)  acetaminophen (TYLENOL) tablet 1,000 mg (has no administration in time range)  insulin aspart (novoLOG) injection 0-6 Units (3 Units Subcutaneous Given 10/09/23 1213)  aspirin chewable tablet 81 mg (81 mg Oral Given 10/09/23 0922)  oxyCODONE (Oxy IR/ROXICODONE) immediate release tablet 5 mg (has no administration in time range)    Or  oxyCODONE (Oxy IR/ROXICODONE) immediate release tablet 10 mg (has no administration in time range)  amLODipine (NORVASC) tablet 10 mg (10 mg Oral Given 10/09/23 0922)  metoprolol tartrate (LOPRESSOR) tablet 25 mg (25 mg Oral Given 10/09/23 0922)  rosuvastatin (CRESTOR) tablet 20 mg (has no administration in time range)  amitriptyline (ELAVIL) tablet 150 mg (has no administration in time range)  busPIRone (BUSPAR) tablet 10 mg (10 mg Oral Given 10/09/23 0922)  lurasidone (LATUDA) tablet 80 mg (has no administration in time range)  traZODone (DESYREL) tablet 200 mg (has no administration in time range)  baclofen (LIORESAL) tablet 10 mg (10 mg Oral Given 10/09/23 0922)  gabapentin (NEURONTIN) capsule 300 mg (300 mg Oral Given 10/09/23 0922)  topiramate (TOPAMAX) tablet 200 mg (has no administration in time range)  0.9 %  sodium chloride infusion (0 mLs Intravenous Stopped 10/09/23 1152)  iohexol (OMNIPAQUE) 350 MG/ML injection 75 mL (75 mLs Intravenous Contrast Given 10/08/23 2333)  potassium chloride 10 mEq in 100 mL IVPB (0 mEq Intravenous Stopped 10/09/23 1152)  magnesium sulfate IVPB 2 g 50 mL (0 g Intravenous Stopped 10/09/23 0806)  potassium chloride (KLOR-CON) packet 40 mEq (40 mEq Oral Given 10/09/23 5284)    Mobility walks     Focused Assessments     R Recommendations: See Admitting Provider Note  Report given to:   Additional Notes:

## 2023-10-09 NOTE — Progress Notes (Addendum)
  Patient's nurse reported that blood glucose is 334.  Currently patient is on sliding scale insulin 3 times daily with meal.  Resumed Tresiba 12 units at bedtime.  Tereasa Coop, MD [facility] 10/09/2023, 9:53 PM

## 2023-10-09 NOTE — ED Notes (Signed)
No EKG done on PT
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
## 2023-10-09 NOTE — Code Documentation (Signed)
Stroke Response Nurse Documentation Code Documentation  Christabelle Levana Minetti is a 52 y.o. female arriving to [facility]  via [facility] on 10/9 with past medical hx of DM, HTN, OSA with CPAP, HLD, NSTEMI, bipolar and CVA. On aspirin 81 mg daily. Code stroke was activated by EMS.   Patient from home where she was LKW at 2200 and now complaining of right sided weakness after a syncopal episode at home.   Stroke team at the bedside on patient arrival. Labs drawn and patient cleared for CT by Dr. Wilkie Aye. Patient to CT with team. NIHSS 9, see documentation for details and code stroke times. Patient with bilateral arm weakness, right leg weakness, right limb ataxia, right decreased sensation, Expressive aphasia , and dysarthria  on exam. Ms. Kasik's exam improved during workup. The following imaging was completed:  CT Head, CTA, and MRI. Patient is not a candidate for IV Thrombolytic due to MRI negative for acute stroke. Patient is not a candidate for IR due to No LVO.    Bedside handoff with ED RN.    Rose Fillers  Rapid Response RN

## 2023-10-09 NOTE — ED Notes (Signed)
PT was able to be redirected by [facility], same was convinced to not leave AMA and was transported off the ED floor to the admitting floor.

## 2023-10-09 NOTE — Progress Notes (Signed)
Received from ED on cart.  Oriented to room and surroundings.

## 2023-10-09 NOTE — Procedures (Signed)
Patient Name: Jaedah Lords  MRN: [id number]  Epilepsy Attending: Charlsie Quest  Referring Physician/Provider: Gordy Councilman, MD  Date: 10/09/2023 Duration: 27.24 mins  Patient history: 52yo F with syncope getting eeg to evaluate for seizure  Level of alertness: Awake, asleep  AEDs during EEG study: TPM, GBP  Technical aspects: This EEG study was done with scalp electrodes positioned according to the 10-20 International system of electrode placement. Electrical activity was reviewed with band pass filter of 1-70Hz , sensitivity of 7 uV/mm, display speed of 76mm/sec with a 60Hz  notched filter applied as appropriate. EEG data were recorded continuously and digitally stored.  Video monitoring was available and reviewed as appropriate.  Description: The posterior dominant rhythm consists of 8 Hz activity of moderate voltage (25-35 uV) seen predominantly in posterior head regions, symmetric and reactive to eye opening and eye closing. Sleep was characterized by vertex waves, sleep spindles (12 to 14 Hz), maximal frontocentral region. EEG showed continuous generalized 3 to 5 Hz theta-delta slowing. Hyperventilation and photic stimulation were not performed.     ABNORMALITY - Continuous slow, generalized  IMPRESSION: This study is suggestive of moderate diffuse encephalopathy. No seizures or epileptiform discharges were seen throughout the recording.  Sae Handrich Annabelle Harman

## 2023-10-09 NOTE — H&P (Addendum)
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 index is 28.19 kg/m.    DVT prophylaxis:  Lovenox Code Status:  Full Code Diet:  Diet Orders (From admission, onward)     Start     Ordered   10/09/23 0459  Diet regular Room service appropriate? Yes; Fluid consistency: Thin  Diet effective now       Question Answer Comment  Room service appropriate? Yes   Fluid consistency: Thin      10/09/23 0502           Family Communication: No Consults: Neurology Admission status:   Observation, Telemetry bed  Severity of Illness: The appropriate patient status for this patient is OBSERVATION. Observation status is judged to be reasonable and necessary in order to provide the required intensity of service to ensure the patient's safety. The patient's presenting symptoms, physical exam  findings, and initial radiographic and laboratory data in the context of their medical condition is felt to place them at  decreased risk for further clinical deterioration. Furthermore, it is anticipated that the patient will be medically stable for discharge from the hospital within 2 midnights of admission.    Dolly Rias, MD [facility]  How to contact the [facility] Attending or Consulting provider 7A - 7P or covering provider during after hours 7P -7A, for this patient.  Check the care team in [facility] and look for a) attending/consulting [facility] provider listed and b) the [facility] team listed Log into [email or web address] and use [place]'s universal password to access. If you do not have the password, please contact the hospital operator. Locate the [facility] provider you are looking for under [facility] and page to a number that you can be directly reached. If you still have difficulty reaching the provider, please page the [facility] (Director on Call) for the Hospitalists listed on amion for assistance.  10/09/2023, 5:06 AM

## 2023-10-09 NOTE — ED Notes (Signed)
Breakfast given.  POC explained.  Call bell given.  No distress at this time

## 2023-10-09 NOTE — ED Notes (Signed)
ED TO INPATIENT HANDOFF REPORT  ED Nurse Name and Phone #: Rodney Booze [phone]  S Name/Age/Gender Stephanie Thornton 52 y.o. female Room/Bed: 028C/028C  Code Status   Code Status: Full Code  Home/SNF/Other Home Patient oriented to: self, place, time, and situation Is this baseline? Yes   Triage Complete: Triage complete  Chief Complaint Syncope [R55]  Triage Note PT was in bed at  home around 2200 when husband stated that she had a syncopal episode. Blood sugar at home was CBG:49 but EMS got a CBG of 126 when they arrived.Pt had some right sided deficit but symptoms were worse on the right side when she arrived, right sided leg drooping and some aphasia/slurring was noticed when speaking to her. Upon arrival to CT,PT's right side deficit became clearer and speech was less slurred.PT also has had recent toe surgery.   Allergies Allergies  Allergen Reactions   Lyrica [Pregabalin] Shortness Of Breath, Itching and Rash   Rocephin [Ceftriaxone Sodium In Dextrose] Shortness Of Breath   Sulfa Antibiotics Hives, Shortness Of Breath and Itching   Ultram [Tramadol] Hives and Shortness Of Breath    Had asthma attack   Vancomycin Anaphylaxis    Per Dr. Flonnie Overman   Glucotrol [Glipizide] Hives   Keflex [Cephalexin] Hives   Penicillins Hives    Has patient had a PCN reaction causing immediate rash, facial/tongue/throat swelling, SOB or lightheadedness with hypotension: YES Has patient had a PCN reaction causing severe rash involving mucus membranes or skin necrosis: NO Has patient had a PCN reaction that required hospitalization: YES Has patient had a PCN reaction occurring within the last 10 years: YES If all of the above answers are "NO", then may proceed with Cephalosporin use.   Janumet [Sitagliptin-Metformin Hcl] Nausea Only    Level of Care/Admitting Diagnosis ED Disposition     ED Disposition  Admit   Condition  --   Comment  Hospital Area[facility]  [[id number]]  Level of Care: Telemetry Medical [104]  May place patient in observation at [facility] or [facility] if equivalent level of care is available:: No  Covid Evaluation: Asymptomatic - no recent exposure (last 10 days) testing not required  Diagnosis: Syncope [206001]  Admitting Physician: Dolly Rias [[id number]]  Attending Physician: Dolly Rias [[id number]]          B Medical/Surgery History Past Medical History:  Diagnosis Date   Anxiety    Arthritis    "knees, feet, back" (05/24/2015)   Benign essential HTN 05/01/2015   Bipolar disorder (HCC) diagnosed early 90s   CAD in native artery    Chronic bronchitis (HCC)    "get it q yr"   Chronic lower back pain    Depression    Diabetes mellitus type II 2010   GERD (gastroesophageal reflux disease)    Headache    "maybe 3 times/wk" (05/24/2015)   History of hiatal hernia    History of stomach ulcers    HLD (hyperlipidemia)    Menopause    Migraine    "1-2/wk" (05/24/2015)   NSTEMI (non-ST elevated myocardial infarction) (HCC) 04/2022   OSA on CPAP    Stroke (HCC)    r sided weakness   Past Surgical History:  Procedure Laterality Date   ABDOMINAL HYSTERECTOMY  2007   BUNIONECTOMY Bilateral    CARPAL TUNNEL RELEASE Right    CORONARY STENT INTERVENTION N/A 05/07/2022   Procedure: CORONARY STENT INTERVENTION;  Surgeon: Lyn Records, MD;  Location: [facility] INVASIVE CV  LAB;  Service: Cardiovascular;  Laterality: N/A;   HAMMER TOE SURGERY Left    LEFT HEART CATH AND CORONARY ANGIOGRAPHY N/A 05/20/2018   Procedure: LEFT HEART CATH AND CORONARY ANGIOGRAPHY;  Surgeon: Marykay Lex, MD;  Location: [facility] INVASIVE CV LAB;  Service: Cardiovascular;  Laterality: N/A;   LEFT HEART CATH AND CORONARY ANGIOGRAPHY N/A 05/07/2022   Procedure: LEFT HEART CATH AND CORONARY ANGIOGRAPHY;  Surgeon: Lyn Records, MD;  Location: [facility] INVASIVE CV LAB;  Service: Cardiovascular;  Laterality: N/A;   TONSILLECTOMY Bilateral 05/24/2015    Procedure: TONSILLECTOMY;  Surgeon: Christia Reading, MD;  Location: [facility] OR;  Service: ENT;  Laterality: Bilateral;     A IV Location/Drains/Wounds Patient Lines/Drains/Airways Status     Active Line/Drains/Airways     None            Intake/Output Last 24 hours No intake or output data in the 24 hours ending 10/09/23 0602  Labs/Imaging Results for orders placed or performed during the hospital encounter of 10/08/23 (from the past 48 hour(s))  Phosphorus     Status: Abnormal   Collection Time: 10/08/23 11:05 PM  Result Value Ref Range   Phosphorus 5.0 (H) 2.5 - 4.6 mg/dL    Comment: Performed at [facility] Lab, [address], [place], [place] [address]  CBC     Status: None   Collection Time: 10/08/23 11:07 PM  Result Value Ref Range   WBC 10.5 4.0 - 10.5 K/uL   RBC 4.61 3.87 - 5.11 MIL/uL   Hemoglobin 12.9 12.0 - 15.0 g/dL   HCT 60.4 54.0 - 98.1 %   MCV 86.1 80.0 - 100.0 fL   MCH 28.0 26.0 - 34.0 pg   MCHC 32.5 30.0 - 36.0 g/dL   RDW 19.1 47.8 - 29.5 %   Platelets 232 150 - 400 K/uL   nRBC 0.0 0.0 - 0.2 %    Comment: Performed at [facility] Lab, [address] [place], [place] [address]  Differential     Status: Abnormal   Collection Time: 10/08/23 11:07 PM  Result Value Ref Range   Neutrophils Relative % 44 %   Neutro Abs 4.6 1.7 - 7.7 K/uL   Lymphocytes Relative 42 %   Lymphs Abs 4.6 (H) 0.7 - 4.0 K/uL   Monocytes Relative 7 %   Monocytes Absolute 0.7 0.1 - 1.0 K/uL   Eosinophils Relative 5 %   Eosinophils Absolute 0.5 0.0 - 0.5 K/uL   Basophils Relative 1 %   Basophils Absolute 0.1 0.0 - 0.1 K/uL   Immature Granulocytes 1 %   Abs Immature Granulocytes 0.06 0.00 - 0.07 K/uL    Comment: Performed at [facility] Lab, [address] [place], [place] [address]  Comprehensive metabolic panel     Status: Abnormal   Collection Time: 10/08/23 11:07 PM  Result Value Ref Range   Sodium 143 135 - 145 mmol/L   Potassium 3.1 (L) 3.5 - 5.1 mmol/L   Chloride  106 98 - 111 mmol/L   CO2 21 (L) 22 - 32 mmol/L   Glucose, Bld 127 (H) 70 - 99 mg/dL    Comment: Glucose reference range applies only to samples taken after fasting for at least 8 hours.   BUN 12 6 - 20 mg/dL   Creatinine, Ser 4.69 (H) 0.44 - 1.00 mg/dL   Calcium 9.4 8.9 - 62.9 mg/dL   Total Protein 6.5 6.5 - 8.1 g/dL   Albumin 3.4 (L) 3.5 -  5.0 g/dL   AST 14 (L) 15 - 41 U/L   ALT 12 0 - 44 U/L   Alkaline Phosphatase 77 38 - 126 U/L   Total Bilirubin 0.3 0.3 - 1.2 mg/dL   GFR, Estimated >02 >72 mL/min    Comment: (NOTE) Calculated using the CKD-EPI Creatinine Equation (2021)    Anion gap 16 (H) 5 - 15    Comment: Performed at [facility] Lab, [address], [place], [place] [address]  Magnesium     Status: Abnormal   Collection Time: 10/08/23 11:07 PM  Result Value Ref Range   Magnesium 1.5 (L) 1.7 - 2.4 mg/dL    Comment: Performed at [facility] Lab, [address], [place], [place] [address]  Ethanol     Status: None   Collection Time: 10/08/23 11:11 PM  Result Value Ref Range   Alcohol, Ethyl (B) <10 <10 mg/dL    Comment: (NOTE) Lowest detectable limit for serum alcohol is 10 mg/dL.  For medical purposes only. Performed at [facility] Lab, [address], [place], [place] [address]   I-stat chem 8, ED     Status: Abnormal   Collection Time: 10/08/23 11:14 PM  Result Value Ref Range   Sodium 142 135 - 145 mmol/L   Potassium 3.0 (L) 3.5 - 5.1 mmol/L   Chloride 107 98 - 111 mmol/L   BUN 12 6 - 20 mg/dL   Creatinine, Ser 6.38 0.44 - 1.00 mg/dL   Glucose, Bld 756 (H) 70 - 99 mg/dL    Comment: Glucose reference range applies only to samples taken after fasting for at least 8 hours.   Calcium, Ion 1.13 (L) 1.15 - 1.40 mmol/L   TCO2 21 (L) 22 - 32 mmol/L   Hemoglobin 13.6 12.0 - 15.0 g/dL   HCT 43.3 29.5 - 18.8 %  CBG monitoring, ED     Status: Abnormal   Collection Time: 10/09/23 12:05 AM  Result Value Ref Range   Glucose-Capillary 131 (H) 70 - 99 mg/dL     Comment: Glucose reference range applies only to samples taken after fasting for at least 8 hours.  Urine rapid drug screen (hosp performed)     Status: None   Collection Time: 10/09/23  3:03 AM  Result Value Ref Range   Opiates NONE DETECTED NONE DETECTED   Cocaine NONE DETECTED NONE DETECTED   Benzodiazepines NONE DETECTED NONE DETECTED   Amphetamines NONE DETECTED NONE DETECTED   Tetrahydrocannabinol NONE DETECTED NONE DETECTED   Barbiturates NONE DETECTED NONE DETECTED    Comment: (NOTE) DRUG SCREEN FOR MEDICAL PURPOSES ONLY.  IF CONFIRMATION IS NEEDED FOR ANY PURPOSE, NOTIFY LAB WITHIN 5 DAYS.  LOWEST DETECTABLE LIMITS FOR URINE DRUG SCREEN Drug Class                     Cutoff (ng/mL) Amphetamine and metabolites    1000 Barbiturate and metabolites    200 Benzodiazepine                 200 Opiates and metabolites        300 Cocaine and metabolites        300 THC                            50 Performed at [facility] Lab, [address] [place], [place] [address]   Urinalysis, Routine w reflex microscopic -Urine, Clean Catch  Status: Abnormal   Collection Time: 10/09/23  3:03 AM  Result Value Ref Range   Color, Urine YELLOW YELLOW   APPearance CLEAR CLEAR   Specific Gravity, Urine 1.034 (H) 1.005 - 1.030   pH 7.0 5.0 - 8.0   Glucose, UA NEGATIVE NEGATIVE mg/dL   Hgb urine dipstick NEGATIVE NEGATIVE   Bilirubin Urine NEGATIVE NEGATIVE   Ketones, ur NEGATIVE NEGATIVE mg/dL   Protein, ur NEGATIVE NEGATIVE mg/dL   Nitrite NEGATIVE NEGATIVE   Leukocytes,Ua NEGATIVE NEGATIVE    Comment: Performed at [facility], [address] [place], [place] [address]   MR BRAIN WO CONTRAST  Result Date: 10/09/2023 CLINICAL DATA:  Initial evaluation for neuro deficit, stroke suspected. EXAM: MRI HEAD WITHOUT CONTRAST TECHNIQUE: Multiplanar, multiecho pulse sequences of the brain and surrounding structures were obtained without intravenous contrast. COMPARISON:  Prior CTs  from 10/08/2023. FINDINGS: Brain: Cerebral volume within normal limits. Scattered patchy T2/FLAIR hyperintensity involving the periventricular and deep white matter both cerebral hemispheres, most like related chronic microvascular ischemic disease, mild in nature. Few scattered superimposed remote lacunar infarcts present about the left anterior corpus callosum, right lentiform nucleus, left thalamus, and left middle cerebellar peduncle. Chronic parietal infarct present at the left parietal lobe. Associated mild chronic hemosiderin staining and/or laminar necrosis at this location. No evidence for acute or subacute ischemia. Gray-white matter differentiation maintained. No other acute or chronic intracranial blood products. No mass lesion, midline shift or mass effect. No hydrocephalus or extra-axial fluid collection. 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 Vascular: Major intracranial vascular flow voids are maintained. Skull and upper cervical spine: Craniocervical junction within normal limits. Bone marrow signal intensity normal. No scalp soft tissue abnormality. Sinuses/Orbits: Globes and orbital soft tissues within normal limits. Paranasal sinuses are largely clear. No significant mastoid effusion. Other: None. IMPRESSION: 1. No acute intracranial abnormality. 2. Chronic left parietal infarct. 3. Underlying mild chronic microvascular ischemic disease with a few scattered remote lacunar infarcts as above. 4. 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 Correlation with pituitary function tests suggested. Follow-up examination with nonemergent pituitary protocol MRI could be performed for further evaluation as warranted. Electronically Signed   By: Rise Mu M.D.   On: 10/09/2023 02:14   CT  ANGIO HEAD NECK W WO CM (CODE STROKE)  Result Date: 10/09/2023 CLINICAL DATA:  Initial evaluation for neuro deficit, stroke suspected, right-sided weakness. EXAM: CT ANGIOGRAPHY HEAD AND NECK WITH AND WITHOUT CONTRAST TECHNIQUE: Multidetector CT imaging of the head and neck was performed using the standard protocol during bolus administration of intravenous contrast. Multiplanar CT image reconstructions and MIPs were obtained to evaluate the vascular anatomy. Carotid stenosis measurements (when applicable) are obtained utilizing NASCET criteria, using the distal internal carotid diameter as the denominator. RADIATION DOSE REDUCTION: This exam was performed according to the departmental dose-optimization program which includes automated exposure control, adjustment of the mA and/or kV according to patient size and/or use of iterative reconstruction technique. CONTRAST:  75mL OMNIPAQUE IOHEXOL 350 MG/ML SOLN COMPARISON:  Prior CT from earlier same day as well as recent CTA from 09/06/2023 FINDINGS: CTA NECK FINDINGS Aortic arch: Visualized aortic arch within normal limits for caliber with standard branch pattern. Minor atheromatous change about the arch itself. No stenosis about the origin the great vessels. Right carotid system: Right common and internal carotid  arteries are patent without dissection. Mild atheromatous change about the right carotid bulb without hemodynamically significant greater than 50% stenosis. Left carotid system: Left common and internal carotid arteries are patent without dissection. Mild atheromatous change about the left carotid bulb without hemodynamically significant greater than 50% stenosis. Vertebral arteries: Both vertebral arteries arise from subclavian arteries. No proximal subclavian artery stenosis. Proximal right vertebral artery not well seen or assessed due to adjacent venous contamination. Visualized vertebral arteries patent without stenosis or dissection. Skeleton: No  discrete or worrisome osseous lesions. Other neck: No other acute finding within the neck. Multiple thyroid nodules noted, largest of which measures 1.9 cm on the left. These have been previously evaluated by ultrasound and biopsy (ref: J Am Coll Radiol. 2015 Feb;12(2): 143-50). Upper chest: Scattered atelectatic changes. No other acute finding. Emphysematous changes again noted. Review of the MIP images confirms the above findings CTA HEAD FINDINGS Anterior circulation: Atheromatous change about the carotid siphons with associated moderate multifocal narrowing, stable. A1 segments patent bilaterally. Normal anterior communicating artery complex. Anterior cerebral arteries patent without significant stenosis. No M1 stenosis or occlusion. Distal MCA branches perfused and symmetric. Posterior circulation: Both V4 segments patent without stenosis. Both PICA patent. Basilar mildly irregular but patent without significant stenosis. Superior cerebral arteries patent bilaterally. Both PCAs supplied via the basilar. Atheromatous irregularity seen about the PCAs bilaterally with associated mild multifocal stenoses, stable. Venous sinuses: Patent allowing for timing the contrast bolus. Anatomic variants: None significant.  No aneurysm. Review of the MIP images confirms the above findings IMPRESSION: 1. Stable CTA as compared to 09/06/2023. No large vessel occlusion or other emergent finding. 2. Atheromatous change about the carotid siphons with associated moderate multifocal narrowing, stable. 3. Atheromatous irregularity about the PCAs bilaterally with associated mild multifocal stenoses, stable. 4. Atheromatous change about the carotid bifurcations without hemodynamically significant greater than 50% stenosis. 5. Aortic Atherosclerosis (ICD10-I70.0) and Emphysema (ICD10-J43.9). These results were communicated to Dr. Iver Nestle at 11:56 pm on 10/08/2023 by text page via the [facility] messaging system. Electronically Signed   By:  Rise Mu M.D.   On: 10/09/2023 00:00   CT HEAD CODE STROKE WO CONTRAST  Result Date: 10/08/2023 CLINICAL DATA:  Code stroke. Initial evaluation for neuro deficit, stroke suspected. Right-sided weakness. EXAM: CT HEAD WITHOUT CONTRAST TECHNIQUE: Contiguous axial images were obtained from the base of the skull through the vertex without intravenous contrast. RADIATION DOSE REDUCTION: This exam was performed according to the departmental dose-optimization program which includes automated exposure control, adjustment of the mA and/or kV according to patient size and/or use of iterative reconstruction technique. COMPARISON:  Prior study from 09/05/2023. FINDINGS: Brain: Cerebral volume within normal limits. Small chronic left parietal infarct noted. No acute intracranial hemorrhage. No acute large vessel territory infarct. No mass lesion or midline shift. No hydrocephalus or extra-axial fluid collection. Vascular: No abnormal hyperdense vessel. Scattered vascular calcifications noted within the carotid siphons. Skull: Scalp soft tissues within normal limits.  Calvarium intact. Sinuses/Orbits: Globes and orbital soft tissues within normal limits. Visualized paranasal sinuses are clear. No mastoid effusion. Other: None. ASPECTS [facility] Stroke Program Early CT Score) - Ganglionic level infarction (caudate, lentiform nuclei, internal capsule, insula, M1-M3 cortex): 7 - Supraganglionic infarction (M4-M6 cortex): 3 Total score (0-10 with 10 being normal): 10 IMPRESSION: 1. No acute intracranial abnormality. 2. ASPECTS is 10. 3. Small remote left parietal infarct. Results were called by telephone at the time of interpretation on 10/08/2023 at 11:25 pm to provider Dr. Iver Nestle, Who verbally  acknowledged these results. Electronically Signed   By: Rise Mu M.D.   On: 10/08/2023 23:25    Pending Labs Unresulted Labs (From admission, onward)     Start     Ordered   10/09/23 0600  Cortisol-am, blood   Once,   R        10/09/23 0559   10/09/23 0600  TSH  Once,   R        10/09/23 0559   10/09/23 0600  T4, free  Once,   R        10/09/23 0559   10/08/23 2330  Protime-INR  Once,   STAT        10/08/23 2330   10/08/23 2330  APTT  Once,   STAT        10/08/23 2330            Vitals/Pain Today's Vitals   10/09/23 0032 10/09/23 0034 10/09/23 0530 10/09/23 0545  BP: 131/86  (!) 143/85 (!) 191/104  Pulse: 78  81 90  Resp: 20  19 12   Temp: 98.6 F (37 C)     TempSrc: Oral     SpO2: 98%  99% 100%  Weight:  69.9 kg    Height:  5\' 2"  (1.575 m)    PainSc:  0-No pain      Isolation Precautions No active isolations  Medications Medications  enoxaparin (LOVENOX) injection 40 mg (has no administration in time range)  sodium chloride flush (NS) 0.9 % injection 3 mL (has no administration in time range)  potassium chloride 10 mEq in 100 mL IVPB (has no administration in time range)  acetaminophen (TYLENOL) tablet 1,000 mg (has no administration in time range)  insulin aspart (novoLOG) injection 0-6 Units (has no administration in time range)  aspirin chewable tablet 81 mg (has no administration in time range)  oxyCODONE (Oxy IR/ROXICODONE) immediate release tablet 5 mg (has no administration in time range)    Or  oxyCODONE (Oxy IR/ROXICODONE) immediate release tablet 10 mg (has no administration in time range)  amLODipine (NORVASC) tablet 10 mg (has no administration in time range)  metoprolol tartrate (LOPRESSOR) tablet 25 mg (has no administration in time range)  rosuvastatin (CRESTOR) tablet 20 mg (has no administration in time range)  amitriptyline (ELAVIL) tablet 150 mg (has no administration in time range)  busPIRone (BUSPAR) tablet 10 mg (has no administration in time range)  lurasidone (LATUDA) tablet 80 mg (has no administration in time range)  traZODone (DESYREL) tablet 200 mg (has no administration in time range)  baclofen (LIORESAL) tablet 10 mg (has no administration  in time range)  gabapentin (NEURONTIN) capsule 300 mg (has no administration in time range)  topiramate (TOPAMAX) tablet 200 mg (has no administration in time range)  magnesium sulfate IVPB 2 g 50 mL (has no administration in time range)  iohexol (OMNIPAQUE) 350 MG/ML injection 75 mL (75 mLs Intravenous Contrast Given 10/08/23 2333)    Mobility walks     Focused Assessments Cardiac Assessment Handoff:    Lab Results  Component Value Date   CKTOTAL 64 07/09/2022   TROPONINI <0.03 05/20/2018   Lab Results  Component Value Date   DDIMER 0.46 02/17/2012   Does the Patient currently have chest pain? No    R Recommendations: See Admitting Provider Note  Report given to:   Additional Notes:

## 2023-10-10 DIAGNOSIS — R55 Syncope and collapse: Secondary | ICD-10-CM | POA: Diagnosis not present

## 2023-10-10 LAB — CBC
HCT: 36.7 % (ref 36.0–46.0)
Hemoglobin: 12.2 g/dL (ref 12.0–15.0)
MCH: 28.5 pg (ref 26.0–34.0)
MCHC: 33.2 g/dL (ref 30.0–36.0)
MCV: 85.7 fL (ref 80.0–100.0)
Platelets: 210 10*3/uL (ref 150–400)
RBC: 4.28 MIL/uL (ref 3.87–5.11)
RDW: 15.6 % — ABNORMAL HIGH (ref 11.5–15.5)
WBC: 7.5 10*3/uL (ref 4.0–10.5)
nRBC: 0 % (ref 0.0–0.2)

## 2023-10-10 LAB — BASIC METABOLIC PANEL
Anion gap: 14 (ref 5–15)
BUN: 14 mg/dL (ref 6–20)
CO2: 22 mmol/L (ref 22–32)
Calcium: 9.5 mg/dL (ref 8.9–10.3)
Chloride: 107 mmol/L (ref 98–111)
Creatinine, Ser: 0.85 mg/dL (ref 0.44–1.00)
GFR, Estimated: >60 mL/min (ref >60)
Glucose, Bld: 234 mg/dL — ABNORMAL HIGH (ref 70–99)
Potassium: 3.9 mmol/L (ref 3.5–5.1)
Sodium: 143 mmol/L (ref 135–145)

## 2023-10-10 LAB — GLUCOSE, CAPILLARY
Glucose-Capillary: 247 mg/dL — ABNORMAL HIGH (ref 70–99)
Glucose-Capillary: 324 mg/dL — ABNORMAL HIGH (ref 70–99)

## 2023-10-10 LAB — MAGNESIUM: Magnesium: 1.9 mg/dL (ref 1.7–2.4)

## 2023-10-10 LAB — PHOSPHORUS: Phosphorus: 3.3 mg/dL (ref 2.5–4.6)

## 2023-10-10 NOTE — Plan of Care (Signed)
  Problem: Education: Goal: Ability to describe self-care measures that may prevent or decrease complications (Diabetes Survival Skills Education) will improve Outcome: Progressing Goal: Individualized Educational Video(s) Outcome: Progressing   Problem: Coping: Goal: Ability to adjust to condition or change in health will improve Outcome: Progressing   Problem: Fluid Volume: Goal: Ability to maintain a balanced intake and output will improve Outcome: Progressing   Problem: Health Behavior/Discharge Planning: Goal: Ability to identify and utilize available resources and services will improve Outcome: Progressing Goal: Ability to manage health-related needs will improve Outcome: Progressing   Problem: Metabolic: Goal: Ability to maintain appropriate glucose levels will improve Outcome: Progressing   Problem: Nutritional: Goal: Maintenance of adequate nutrition will improve Outcome: Progressing Goal: Progress toward achieving an optimal weight will improve Outcome: Progressing   Problem: Skin Integrity: Goal: Risk for impaired skin integrity will decrease Outcome: Progressing   Problem: Tissue Perfusion: Goal: Adequacy of tissue perfusion will improve Outcome: Progressing   Problem: Education: Goal: Knowledge of General Education information will improve Description: Including pain rating scale, medication(s)/side effects and non-pharmacologic comfort measures Outcome: Progressing   Problem: Health Behavior/Discharge Planning: Goal: Ability to manage health-related needs will improve Outcome: Progressing   Problem: Clinical Measurements: Goal: Ability to maintain clinical measurements within normal limits will improve Outcome: Progressing Goal: Will remain free from infection Outcome: Progressing Goal: Diagnostic test results will improve Outcome: Progressing Goal: Respiratory complications will improve Outcome: Progressing Goal: Cardiovascular complication will  be avoided Outcome: Progressing   Problem: Activity: Goal: Risk for activity intolerance will decrease Outcome: Progressing   Problem: Nutrition: Goal: Adequate nutrition will be maintained Outcome: Progressing   Problem: Coping: Goal: Level of anxiety will decrease Outcome: Progressing   Problem: Elimination: Goal: Will not experience complications related to bowel motility Outcome: Progressing Goal: Will not experience complications related to urinary retention Outcome: Progressing   Problem: Pain Managment: Goal: General experience of comfort will improve Outcome: Progressing   Problem: Safety: Goal: Ability to remain free from injury will improve Outcome: Progressing   Problem: Skin Integrity: Goal: Risk for impaired skin integrity will decrease Outcome: Progressing   Problem: Education: Goal: Knowledge of disease or condition will improve Outcome: Progressing Goal: Knowledge of secondary prevention will improve (MUST DOCUMENT ALL) Outcome: Progressing Goal: Knowledge of patient specific risk factors will improve Loraine Leriche N/A or DELETE if not current risk factor) Outcome: Progressing   Problem: Ischemic Stroke/TIA Tissue Perfusion: Goal: Complications of ischemic stroke/TIA will be minimized Outcome: Progressing   Problem: Coping: Goal: Will verbalize positive feelings about self Outcome: Progressing   Problem: Self-Care: Goal: Ability to participate in self-care as condition permits will improve Outcome: Progressing

## 2023-10-10 NOTE — Discharge Instructions (Signed)
Advised to follow-up with endocrinology as scheduled. Patient had syncopal episode likely secondary to hypoglycemia.
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
## 2023-10-14 ENCOUNTER — Ambulatory Visit (INDEPENDENT_AMBULATORY_CARE_PROVIDER_SITE_OTHER): Payer: Medicare HMO | Admitting: Podiatry

## 2023-10-14 ENCOUNTER — Encounter: Payer: Self-pay | Admitting: Podiatry

## 2023-10-14 DIAGNOSIS — T847XXA Infection and inflammatory reaction due to other internal orthopedic prosthetic devices, implants and grafts, initial encounter: Secondary | ICD-10-CM

## 2023-10-14 NOTE — Progress Notes (Signed)
She presents today to remove the sutures from her second digit of her right foot.  States that is been doing pretty well it does not hurt anything like it did prior to surgery.  She is status post screw removal second digit right foot x 3 weeks.  Objective: Vital signs are stable she is alert oriented x 3 there is no erythema Dem salines drainage odor sutures intact margins well coapted there is no signs of infection.  Assessment: Well-healing surgical toe.  Plan: Remove all the sutures today margins remain well coapted place Band-Aids let her get back to her regular routine.

## 2023-10-16 ENCOUNTER — Ambulatory Visit (INDEPENDENT_AMBULATORY_CARE_PROVIDER_SITE_OTHER): Payer: Medicare HMO | Admitting: Podiatrist

## 2023-10-16 DIAGNOSIS — Z91199 Patient's noncompliance with other medical treatment and regimen due to unspecified reason: Secondary | ICD-10-CM

## 2023-10-17 NOTE — Progress Notes (Signed)
   Complete physical exam  Patient: Stephanie Thornton   DOB: 10/19/1998   52 y.o. Female  MRN: [id number]  Subjective:    No chief complaint on file.   Stephanie Thornton is a 52 y.o. female who presents today for a complete physical exam. She reports consuming a {diet types:17450} diet. {types:19826} She generally feels {DESC; WELL/FAIRLY WELL/POORLY:18703}. She reports sleeping {DESC; WELL/FAIRLY WELL/POORLY:18703}. She {does/does not:200015} have additional problems to discuss today.    Most recent fall risk assessment:    06/26/2022   10:42 AM  Fall Risk   Falls in the past year? 0  Number falls in past yr: 0  Injury with Fall? 0  Risk for fall due to : No Fall Risks  Follow up Falls evaluation completed     Most recent depression screenings:    06/26/2022   10:42 AM 05/17/2021   10:46 AM  PHQ 2/9 Scores  PHQ - 2 Score 0 0  PHQ- 9 Score 5     {VISON DENTAL STD PSA (Optional):27386}  {History (Optional):23778}  Patient Care Team: Jessup, Joy, NP as PCP - General (Nurse Practitioner)   Outpatient Medications Prior to Visit  Medication Sig   fluticasone (FLONASE) 50 MCG/ACT nasal spray Place 2 sprays into both nostrils in the morning and at bedtime. After 7 days, reduce to once daily.   norgestimate-ethinyl estradiol (SPRINTEC 28) 0.25-35 MG-MCG tablet Take 1 tablet by mouth daily.   Nystatin POWD Apply liberally to affected area 2 times per day   spironolactone (ALDACTONE) 100 MG tablet Take 1 tablet (100 mg total) by mouth daily.   No facility-administered medications prior to visit.    ROS        Objective:     There were no vitals taken for this visit. {Vitals History (Optional):23777}  Physical Exam   No results found for any visits on 08/01/22. {Show previous labs (optional):23779}    Assessment & Plan:    Routine Health Maintenance and Physical Exam  Immunization History  Administered Date(s) Administered   DTaP 01/02/2000, 02/28/2000,  05/08/2000, 01/22/2001, 08/07/2004   Hepatitis A 06/03/2008, 06/09/2009   Hepatitis B 10/20/1999, 11/27/1999, 05/08/2000   HiB (PRP-OMP) 01/02/2000, 02/28/2000, 05/08/2000, 01/22/2001   IPV 01/02/2000, 02/28/2000, 10/27/2000, 08/07/2004   Influenza,inj,Quad PF,6+ Mos 09/09/2014   Influenza-Unspecified 12/09/2012   MMR 10/27/2001, 08/07/2004   Meningococcal Polysaccharide 06/08/2012   Pneumococcal Conjugate-13 01/22/2001   Pneumococcal-Unspecified 05/08/2000, 07/22/2000   Tdap 06/08/2012   Varicella 10/27/2000, 06/03/2008    Health Maintenance  Topic Date Due   HIV Screening  Never done   Hepatitis C Screening  Never done   INFLUENZA VACCINE  07/30/2022   PAP-Cervical Cytology Screening  08/01/2022 (Originally 10/18/2020)   PAP SMEAR-Modifier  08/01/2022 (Originally 10/18/2020)   TETANUS/TDAP  08/01/2022 (Originally 06/08/2022)   HPV VACCINES  Discontinued   COVID-19 Vaccine  Discontinued    Discussed health benefits of physical activity, and encouraged her to engage in regular exercise appropriate for her age and condition.  Problem List Items Addressed This Visit   None Visit Diagnoses     Annual physical exam    -  Primary   Cervical cancer screening       Need for Tdap vaccination          No follow-ups on file.     Joy Jessup, NP   

## 2023-11-06 ENCOUNTER — Ambulatory Visit (INDEPENDENT_AMBULATORY_CARE_PROVIDER_SITE_OTHER): Payer: Medicare HMO | Admitting: Podiatry

## 2023-11-06 ENCOUNTER — Encounter: Payer: Self-pay | Admitting: Podiatry

## 2023-11-06 DIAGNOSIS — Z9889 Other specified postprocedural states: Secondary | ICD-10-CM

## 2023-11-06 DIAGNOSIS — T847XXA Infection and inflammatory reaction due to other internal orthopedic prosthetic devices, implants and grafts, initial encounter: Secondary | ICD-10-CM

## 2023-11-06 NOTE — Progress Notes (Signed)
She presents today for follow-up of her toe.  This is a test that we took the screw out.  She states that the toe is doing pretty good otherwise it still sits up at the metatarsal phalangeal joint as she points to it and states that think there is some rubbing going on.  Objective: Vital signs are stable she is alert and oriented x 3 the toe appears to be healing very nicely there is a small amount of stitch still present.  I removed the stitch for her today.  She has a mild hallux valgus deformity that is resulting in a elevated second metatarsophalangeal joint.  The hallux valgus deformity is flexible so I do think if we were to get the toe sitting in front of the metatarsal phalangeal joint I do think that it would oppose the hallux.  Assessment: Contracted metatarsophalangeal joint second right well-healing surgical foot.  Plan: Discussed etiology pathology and surgical therapies at this point I am going to release her from the surgery component of this we are going to consider performing an extensor tenotomy in approximately 1 month provided she keeps her sugars under control.

## 2023-12-11 ENCOUNTER — Encounter (INDEPENDENT_AMBULATORY_CARE_PROVIDER_SITE_OTHER): Payer: Medicare HMO | Admitting: Podiatry

## 2023-12-11 NOTE — Progress Notes (Signed)
NO SHOW

## 2023-12-31 ENCOUNTER — Encounter (HOSPITAL_COMMUNITY): Payer: 59

## 2023-12-31 ENCOUNTER — Other Ambulatory Visit: Payer: Self-pay

## 2023-12-31 ENCOUNTER — Encounter (HOSPITAL_COMMUNITY): Payer: Self-pay | Admitting: *Deleted

## 2023-12-31 ENCOUNTER — Emergency Department (HOSPITAL_COMMUNITY)
Admission: EM | Admit: 2023-12-31 | Discharge: 2023-12-31 | Disposition: A | Discharge status: Home / Self Care | Payer: 59 | Attending: Emergency Medicine | Admitting: Emergency Medicine

## 2023-12-31 ENCOUNTER — Emergency Department (HOSPITAL_COMMUNITY): Payer: 59

## 2023-12-31 DIAGNOSIS — M25552 Pain in left hip: Secondary | ICD-10-CM | POA: Insufficient documentation

## 2023-12-31 DIAGNOSIS — Z794 Long term (current) use of insulin: Secondary | ICD-10-CM | POA: Insufficient documentation

## 2023-12-31 DIAGNOSIS — Z79899 Other long term (current) drug therapy: Secondary | ICD-10-CM | POA: Diagnosis not present

## 2023-12-31 DIAGNOSIS — Z7982 Long term (current) use of aspirin: Secondary | ICD-10-CM | POA: Diagnosis not present

## 2023-12-31 DIAGNOSIS — M5442 Lumbago with sciatica, left side: Secondary | ICD-10-CM | POA: Insufficient documentation

## 2023-12-31 MED ORDER — APIXABAN 5 MG PO TABS: 10.0000 mg | ORAL_TABLET | Freq: Two times a day (BID) | ORAL | Status: DC

## 2023-12-31 MED ORDER — LIDOCAINE 5 % EX PTCH
1.0000 | MEDICATED_PATCH | CUTANEOUS | Status: DC
  Administered 2023-12-31: 1 via TRANSDERMAL
  Filled 2023-12-31: qty 1

## 2023-12-31 MED ORDER — LIDOCAINE 5 % EX PTCH: 1.0000 | MEDICATED_PATCH | CUTANEOUS | 0 refills | Status: DC

## 2023-12-31 MED ORDER — APIXABAN 5 MG PO TABS
10.0000 mg | ORAL_TABLET | Freq: Once | ORAL | Status: AC
  Administered 2023-12-31: 10 mg via ORAL
  Filled 2023-12-31: qty 2

## 2023-12-31 MED ORDER — OXYCODONE HCL 5 MG PO TABS
5.0000 mg | ORAL_TABLET | Freq: Once | ORAL | Status: AC
  Administered 2023-12-31: 5 mg via ORAL
  Filled 2023-12-31: qty 1

## 2023-12-31 NOTE — ED Provider Notes (Addendum)
 [place] EMERGENCY DEPARTMENT AT [facility] Provider Note   CSN: [id number] Arrival date & time: 12/31/23  1800     History  Chief Complaint  Patient presents with   Hip Pain    Stephanie Thornton is a 53 y.o. female.  Patient here with left hip pain and low back pain rating down the legs for the last several days.  She is on chronic narcotics for back pain.  She is sent here for DVT study.  She is scheduled for an outpatient MRI.  She denies any loss of bowel or bladder.  No weakness or numbness.  Pain worse with movement.  Pain comes and goes.  She denies any pain with urination.  Denies any loss of bowel or bladder.  Has a history of cardiac disease and used to be on Brilinta .  Patient denies any trauma.  Denies any fever chills.  The history is provided by the patient.       Home Medications Prior to Admission medications   Medication Sig Start Date End Date Taking? Authorizing Provider  lidocaine  (LIDODERM ) 5 % Place 1 patch onto the skin daily. Remove & Discard patch within 12 hours or as directed by MD 12/31/23  Yes Reanne Nellums, DO  Acetaminophen  (TYLENOL  PO) Take 2 tablets by mouth 3 (three) times daily as needed (tooth ache).    [provider]  albuterol  (PROVENTIL  HFA;VENTOLIN  HFA) 108 (90 Base) MCG/ACT inhaler Inhale 1-2 puffs into the lungs every 6 (six) hours as needed for wheezing or shortness of breath. 03/04/19   Celestia Rosaline SQUIBB, NP  amitriptyline  (ELAVIL ) 150 MG tablet Take 150 mg by mouth at bedtime. 08/06/22   [provider]  amLODipine  (NORVASC ) 10 MG tablet Take 1 tablet (10 mg total) by mouth daily. 09/23/22   Shlomo Wilbert SAUNDERS, MD  aspirin  81 MG chewable tablet Chew 1 tablet (81 mg total) by mouth daily. 05/09/22   Zhao, Xika, NP  atenolol  (TENORMIN ) 50 MG tablet Take 50 mg by mouth daily. 10/24/23   [provider]  baclofen  (LIORESAL ) 10 MG tablet Take 10 mg by mouth 3 (three) times daily. 05/05/22   [provider]  BRILINTA  90 MG TABS tablet 1 tablet Orally Twice a day    [provider]  busPIRone  (BUSPAR ) 10 MG tablet Take 10 mg by mouth 3 (three) times daily. 01/25/19   [provider]  Cholecalciferol  (DIALYVITE  VITAMIN D3 MAX) 1.25 MG (50000 UT) TABS Take 1 tablet by mouth once a week. 10/08/22     Continuous Glucose Sensor (DEXCOM G6 SENSOR) MISC SMARTSIG:1 Each Topical Every 10 Days 09/07/23   [provider]  Continuous Glucose Transmitter (DEXCOM G6 TRANSMITTER) MISC USE AS DIRECTED EVERY 90 DAYS 09/05/23   [provider]  famotidine  (PEPCID ) 20 MG tablet Take 1 tablet (20 mg total) by mouth 2 (two) times daily. 04/30/23   Kingsley, Victoria K, DO  fluconazole  (DIFLUCAN ) 150 MG tablet Take 150 mg by mouth once a week. 10/03/23   [provider]  gabapentin  (NEURONTIN ) 300 MG capsule Take 1 capsule (300 mg total) by mouth 3 (three) times daily. 11/11/22     Insulin  Pen Needle 31G X 5 MM MISC 1 Units by Does not apply route 4 (four) times daily. 09/07/23   Rojelio Nest, DO  LATUDA  80 MG TABS tablet Take 80 mg by mouth every evening. 02/01/19   [provider]  lisinopril -hydrochlorothiazide  (ZESTORETIC ) 20-12.5 MG tablet Take 1 tablet by  mouth daily.    [provider]  metoprolol  tartrate (LOPRESSOR ) 25 MG tablet Take 1 tablet (25 mg total) by mouth 2 (two) times daily. 09/23/22   Shlomo Wilbert SAUNDERS, MD  nitroGLYCERIN  (NITROSTAT ) 0.4 MG SL tablet Place 1 tablet (0.4 mg total) under the tongue every 5 (five) minutes x 3 doses as needed for chest pain. 05/09/22   Zhao, Xika, NP  NOVOLOG  FLEXPEN 100 UNIT/ML FlexPen Inject 10-15 Units into the skin 3 (three) times daily as needed for high blood sugar. Sliding Scale 09/07/23   Rojelio Nest, DO  Oxycodone  HCl 10 MG TABS Take 10 mg by mouth 4 (four) times daily as needed (pain). 07/08/22   [provider]  rosuvastatin  (CRESTOR ) 20 MG tablet Take 20 mg by mouth at bedtime. 07/29/23   [provider]  topiramate  (TOPAMAX ) 200 MG tablet Take 200 mg by mouth at bedtime. 01/28/19   [provider]  traZODone  (DESYREL ) 100 MG tablet Take 200 mg by mouth at bedtime. 02/24/19   [provider]  TRESIBA  FLEXTOUCH 100 UNIT/ML FlexTouch Pen Inject 12 Units into the skin at bedtime. 09/07/23   Rojelio Nest, DO      Allergies    Lyrica [pregabalin], Rocephin  [ceftriaxone  sodium in dextrose ], Sulfa antibiotics, Ultram [tramadol], Vancomycin, Glucotrol  [glipizide ], Keflex  [cephalexin ], Penicillins, and Janumet  [sitagliptin -metformin  hcl]    Review of Systems   Review of Systems  Physical Exam Updated Vital Signs BP (!) 150/85   Pulse 85   Temp 98.1 F (36.7 C) (Oral)   Resp 18   Ht 5' 2 (1.575 m)   Wt 81.6 kg   SpO2 94%   BMI 32.92 kg/m  Physical Exam Vitals and nursing note reviewed.  Constitutional:      General: She is not in acute distress.    Appearance: She is well-developed. She is not ill-appearing.  HENT:     Head: Normocephalic and atraumatic.     Nose: Nose normal.     Mouth/Throat:     Mouth: Mucous membranes are moist.  Eyes:     Extraocular Movements: Extraocular movements intact.     Conjunctiva/sclera: Conjunctivae normal.     Pupils: Pupils are equal, round, and reactive to light.  Cardiovascular:     Rate and Rhythm: Normal rate and regular rhythm.     Pulses: Normal pulses.     Heart sounds: Normal heart sounds. No murmur heard. Pulmonary:     Effort: Pulmonary effort is normal. No respiratory distress.     Breath sounds: Normal breath sounds.  Abdominal:     Palpations: Abdomen is soft.     Tenderness: There is no abdominal tenderness.  Musculoskeletal:        General: Tenderness present. No swelling.     Cervical back: Normal range of motion and neck supple.     Comments: Tenderness to the paraspinal lumbar muscles on the left, tenderness to the gluteal muscles on the left  Skin:    General: Skin is warm and dry.      Capillary Refill: Capillary refill takes less than 2 seconds.  Neurological:     General: No focal deficit present.     Mental Status: She is alert and oriented to person, place, and time.     Cranial Nerves: No cranial nerve deficit.     Sensory: No sensory deficit.     Motor: No weakness.     Coordination: Coordination normal.     Comments: 5+ out of 5  strength throughout, normal sensation  Psychiatric:        Mood and Affect: Mood normal.     ED Results / Procedures / Treatments   Labs (all labs ordered are listed, but only abnormal results are displayed) Labs Reviewed - No data to display  EKG None  Radiology DG Lumbar Spine Complete Result Date: 12/31/2023 CLINICAL DATA:  Pain. EXAM: LUMBAR SPINE - COMPLETE 4+ VIEW COMPARISON:  Lumbar MRI 12/26/2018 FINDINGS: Transitional lumbosacral anatomy. Same numbering system as on prior MRI with the lower most fully formed disc space is labeled L5-S1. The alignment is normal. No evidence of fracture or compression deformity. The disc spaces are preserved. L5 hemangioma on prior is not well seen by radiograph. The sacroiliac joints are congruent. There are multiple pelvic phleboliths. IMPRESSION: No acute findings or explanation for pain. Electronically Signed   By: Andrea Gasman M.D.   On: 12/31/2023 20:20   DG Hip Unilat With Pelvis 2-3 Views Left Result Date: 12/31/2023 CLINICAL DATA:  Left hip pain. EXAM: DG HIP (WITH OR WITHOUT PELVIS) 2-3V LEFT COMPARISON:  None Available. FINDINGS: The left hip joint space is preserved. There is minor is acetabular spurring. The femoral head is well seated. No fracture. No erosion or evidence of avascular necrosis. Intact bony pelvis. Pubic rami are intact. There are multiple pelvic phleboliths. IMPRESSION: Minor degenerative spurring of the left hip. Electronically Signed   By: Andrea Gasman M.D.   On: 12/31/2023 20:17    Procedures Procedures    Medications Ordered in ED Medications   lidocaine  (LIDODERM ) 5 % 1 patch (1 patch Transdermal Patch Applied 12/31/23 2214)  oxyCODONE  (Oxy IR/ROXICODONE ) immediate release tablet 5 mg (5 mg Oral Given 12/31/23 2213)  apixaban  (ELIQUIS ) tablet 10 mg (10 mg Oral Given 12/31/23 2214)    ED Course/ Medical Decision Making/ A&P                                 Medical Decision Making Amount and/or Complexity of Data Reviewed Radiology: ordered.  Risk Prescription drug management.   Raymond Bassett is here with left hip pain thigh pain back pain.  Differential diagnosis likely sciatica/reticular apathy from the spine, muscle spasm.  She is already on baclofen  and narcotics and ibuprofen .  She follows with a pain specialist.  She denies any trauma.  She is scheduled for an MRI to further evaluate her back.  She has no symptoms to suggest cauda equina.  She is neurovascular neuromuscular intact.  Discussed strong pulses on exam.  She is sent for DVT study by her primary care doctor.  This is fairly low on my differential but we will arrange for an outpatient DVT study tomorrow as we do not have ultrasound available at this time.  Will give her a dose of Eliquis  after shared decision making.  She is given lidocaine  patch Roxicodone  here in the ED.  She had an x-ray done of her lumbar spine and hip that were unremarkable.  No fracture or malalignment.  Overall I do suspect that pain is musculoskeletal in nature.  Will have her follow-up with spine doctor.  Overall continue pain management at home with her muscle relaxant and narcotics.  She will come back for DVT study tomorrow.  Patient discharged in good condition.  Understands return precautions.  Have no concern for other emergent process at this time.  No acute indication for MRI of her lumbar spine.  This chart was dictated using voice recognition software.  Despite best efforts to proofread,  errors can occur which can change the documentation meaning.         Final Clinical  Impression(s) / ED Diagnoses Final diagnoses:  Left hip pain  Acute left-sided low back pain with left-sided sciatica    Rx / DC Orders ED Discharge Orders          Ordered    lidocaine  (LIDODERM ) 5 %  Every 24 hours        12/31/23 2159    LE VENOUS        12/31/23 2200              Ruthe Cornet, DO 12/31/23 2236    Ruthe Cornet, DO 12/31/23 2238

## 2023-12-31 NOTE — ED Triage Notes (Addendum)
 Here by POV for L hip pain, pinpoints to lateral thigh. Describes as severe, fluctuating, changing and involves groin, thigh, hip, front and back, medial and lateral, but mostly lateral. Seen by PCP yesterday. Given an injection. Unable to do steroids d/t DM. Verbalizes PCP ordered an MRI. Also verbalizes PCP instructed her to come to ED to r/o blood clot. Mentions stopping brilinta  in the last 1-2 months. Alert, NASD, calm, interactive. Steady gait with limp. Denies CP, SOB, or other sx.

## 2023-12-31 NOTE — Discharge Instructions (Signed)
 Continue your pain management baclofen  and narcotic pain medicine.  Consider using lidocaine  patches as well.  I have given you a dose of a blood thinner to treat if there was a blood clot but please follow instructions to go back to [facility] tomorrow to get ultrasound done.  As we discussed if you do fall and hit your head or have any injury or trauma or bleeding issues please return for evaluation.  Overall I do think that this is a musculoskeletal problem likely coming from your back.  I have given you the number to follow-up with spine doctor/neurosurgery team.

## 2024-01-01 ENCOUNTER — Ambulatory Visit (HOSPITAL_COMMUNITY)
Admission: RE | Admit: 2024-01-01 | Discharge: 2024-01-01 | Disposition: A | Discharge status: Home / Self Care | Payer: 59 | Source: Ambulatory Visit | Attending: Emergency Medicine | Admitting: Emergency Medicine

## 2024-01-01 DIAGNOSIS — M25552 Pain in left hip: Secondary | ICD-10-CM | POA: Diagnosis not present

## 2024-01-01 DIAGNOSIS — M7989 Other specified soft tissue disorders: Secondary | ICD-10-CM | POA: Insufficient documentation

## 2024-05-14 ENCOUNTER — Ambulatory Visit (HOSPITAL_COMMUNITY): Admission: EM | Admit: 2024-05-14 | Discharge: 2024-05-14 | Disposition: A | Discharge status: Home / Self Care

## 2024-05-14 ENCOUNTER — Encounter (HOSPITAL_COMMUNITY): Payer: Self-pay

## 2024-05-14 ENCOUNTER — Ambulatory Visit (HOSPITAL_COMMUNITY)

## 2024-05-14 DIAGNOSIS — M25562 Pain in left knee: Secondary | ICD-10-CM | POA: Diagnosis not present

## 2024-05-14 DIAGNOSIS — S80212A Abrasion, left knee, initial encounter: Secondary | ICD-10-CM | POA: Diagnosis not present

## 2024-05-14 DIAGNOSIS — S8002XA Contusion of left knee, initial encounter: Secondary | ICD-10-CM

## 2024-05-14 MED ORDER — NAPROXEN 375 MG PO TABS: 375.0000 mg | ORAL_TABLET | Freq: Two times a day (BID) | ORAL | 0 refills | Status: DC

## 2024-05-14 MED ORDER — MUPIROCIN 2 % EX OINT: 1.0000 | TOPICAL_OINTMENT | Freq: Two times a day (BID) | CUTANEOUS | 0 refills | Status: DC

## 2024-05-14 NOTE — ED Provider Notes (Signed)
 [facility]    CSN: [id number] Arrival date & time: 05/14/24  1759      History   Chief Complaint Chief Complaint  Patient presents with   Fall   Knee Injury    HPI Stephanie Thornton is a 53 y.o. female.   Patient presents today with a several hour history of left knee pain.  She reports that she was at work when she was pushing a cart that got caught in a crack causing her to fall forward.  She did not hit her head and denies any loss of consciousness, headache, dizziness, nausea, vomiting, amnesia surrounding event.  She does not take any blood thinning medication other than a baby aspirin  daily for discomfort she did not hit her head.  She fell primarily onto her left side with the majority of her weight on her left knee.  She reports that pain is rated 10 on a 0-10 pain scale, described as sharp, worse with attempted ambulation, no alleviating factors identified.  Denies previous injury or surgery involving her knee.  She denies any increased numbness or paresthesias in her foot; has a history of diabetes and this is chronic but at baseline.  She is prescribed oxycodone  but has not had this medication since the injury.  She has not taken any over-the-counter analgesics.    Past Medical History:  Diagnosis Date   Anxiety    Arthritis    "knees, feet, back" (05/24/2015)   Benign essential HTN 05/01/2015   Bipolar disorder (HCC) diagnosed early 90s   CAD in native artery    Chronic bronchitis (HCC)    "get it q yr"   Chronic lower back pain    Depression    Diabetes mellitus type II 2010   GERD (gastroesophageal reflux disease)    Headache    "maybe 3 times/wk" (05/24/2015)   History of hiatal hernia    History of stomach ulcers    HLD (hyperlipidemia)    Menopause    Migraine    "1-2/wk" (05/24/2015)   NSTEMI (non-ST elevated myocardial infarction) (HCC) 04/2022   OSA on CPAP    Stroke (HCC)    r sided weakness    Patient Active Problem List   Diagnosis  Date Noted   Syncope 10/09/2023   Acute metabolic encephalopathy 09/07/2023   Medically noncompliant 09/07/2023   GERD (gastroesophageal reflux disease) 09/07/2023   HHNC (hyperglycemic hyperosmolar nonketotic coma) (HCC) 09/06/2023   Uncontrolled type 2 diabetes mellitus with hyperglycemia (HCC) 08/27/2022   Splenic lesion 08/27/2022   DKA (diabetic ketoacidosis) (HCC) 07/10/2022   Diabetic ketoacidosis without coma associated with type 2 diabetes mellitus (HCC) 07/07/2022   Acute pancreatitis without infection or necrosis 07/07/2022   AKI (acute kidney injury) (HCC) 07/07/2022   Hypokalemia 07/07/2022   Hypercalcemia 07/07/2022   Coronary artery disease 07/07/2022   Thyroid  nodule 07/07/2022   Leukocytosis 07/07/2022   OSA (obstructive sleep apnea)    Reflux esophagitis    Weakness 05/22/2022   Shortness of breath 05/22/2022   HLD (hyperlipidemia) 05/21/2022   NSTEMI (non-ST elevated myocardial infarction) (HCC) 05/07/2022   Essential hypertension    Hypertensive emergency 05/16/2018   Trigger thumb of left hand 10/15/2017   Chronic pain disorder 06/24/2017   Lumbar degenerative disc disease 06/24/2017   Tobacco use disorder 06/24/2017   Ganglion cyst 03/04/2017   Acquired trigger finger 10/22/2016   Snapping hip, right 10/26/2015   Depression    Diabetes type 2, controlled (HCC)  Chronic tonsillitis 05/24/2015   Abnormal EKG 05/01/2015   Preoperative clearance 05/01/2015   Right-sided low back pain without sciatica 01/25/2015   Hx of peritonsillar abscess drainage 01/03/2015   Obesity (BMI 30-39.9) 10/09/2014   DM (diabetes mellitus) (HCC) 09/10/2013   Hyperosmolar non-ketotic state in patient with type 2 diabetes mellitus (HCC) 09/10/2013   Nausea & vomiting 09/10/2013   Chest pain 09/10/2013   Bipolar disorder (HCC)    Severe bipolar I disorder, current or most recent episode depressed (HCC) 11/26/2011    Past Surgical History:  Procedure Laterality Date    ABDOMINAL HYSTERECTOMY  2007   BUNIONECTOMY Bilateral    CARPAL TUNNEL RELEASE Right    CORONARY STENT INTERVENTION N/A 05/07/2022   Procedure: CORONARY STENT INTERVENTION;  Surgeon: Arty Binning, MD;  Location: [facility];  Service: Cardiovascular;  Laterality: N/A;   HAMMER TOE SURGERY Left    LEFT HEART CATH AND CORONARY ANGIOGRAPHY N/A 05/20/2018   Procedure: LEFT HEART CATH AND CORONARY ANGIOGRAPHY;  Surgeon: Arleen Lacer, MD;  Location: [facility];  Service: Cardiovascular;  Laterality: N/A;   LEFT HEART CATH AND CORONARY ANGIOGRAPHY N/A 05/07/2022   Procedure: LEFT HEART CATH AND CORONARY ANGIOGRAPHY;  Surgeon: Arty Binning, MD;  Location: [facility];  Service: Cardiovascular;  Laterality: N/A;   TONSILLECTOMY Bilateral 05/24/2015   Procedure: TONSILLECTOMY;  Surgeon: Virgina Grills, MD;  Location: [facility] OR;  Service: ENT;  Laterality: Bilateral;    OB History   No obstetric history on file.      Home Medications    Prior to Admission medications   Medication Sig Start Date End Date Taking? Authorizing Provider  Acetaminophen  (TYLENOL  PO) Take 2 tablets by mouth 3 (three) times daily as needed (tooth ache).   Yes [provider]  amitriptyline  (ELAVIL ) 150 MG tablet Take 150 mg by mouth at bedtime. 08/06/22  Yes [provider]  amLODipine  (NORVASC ) 10 MG tablet Take 1 tablet (10 mg total) by mouth daily. 09/23/22  Yes Turner, Rufus Council, MD  aspirin  81 MG chewable tablet Chew 1 tablet (81 mg total) by mouth daily. 05/09/22  Yes Zhao, Xika, NP  atenolol  (TENORMIN ) 50 MG tablet Take 50 mg by mouth daily. 10/24/23  Yes [provider]  baclofen  (LIORESAL ) 10 MG tablet Take 10 mg by mouth 3 (three) times daily. 05/05/22  Yes [provider]  busPIRone  (BUSPAR ) 10 MG tablet Take 10 mg by mouth 3 (three) times daily. 01/25/19  Yes [provider]  Continuous Glucose Sensor (DEXCOM G6 SENSOR) MISC SMARTSIG:1 Each Topical Every 10  Days 09/07/23  Yes [provider]  Continuous Glucose Transmitter (DEXCOM G6 TRANSMITTER) MISC USE AS DIRECTED EVERY 90 DAYS 09/05/23  Yes [provider]  gabapentin  (NEURONTIN ) 300 MG capsule Take 1 capsule (300 mg total) by mouth 3 (three) times daily. 11/11/22  Yes   HUMALOG 100 UNIT/ML injection 60 UNITS DAILY VIA INSULIN  PUMP INJECTION 30 DAYS   Yes [provider]  Insulin  Disposable Pump (OMNIPOD 5 DEXG7G6 PODS GEN 5) MISC SMARTSIG:1 SUB-Q Every 3 Days 04/24/24  Yes [provider]  Insulin  Pen Needle 31G X 5 MM MISC 1 Units by Does not apply route 4 (four) times daily. 09/07/23  Yes Daren Eck, DO  LATUDA  80 MG TABS tablet Take 80 mg by mouth every evening. 02/01/19  Yes [provider]  lidocaine  (LIDODERM ) 5 % Place 1 patch onto the skin daily. Remove & Discard  patch within 12 hours or as directed by MD 12/31/23  Yes Curatolo, Adam, DO  lisinopril -hydrochlorothiazide  (ZESTORETIC ) 20-12.5 MG tablet Take 1 tablet by mouth daily.   Yes [provider]  mupirocin  ointment (BACTROBAN ) 2 % Apply 1 Application topically 2 (two) times daily. 05/14/24  Yes Channon Brougher K, PA-C  naproxen  (NAPROSYN ) 375 MG tablet Take 1 tablet (375 mg total) by mouth 2 (two) times daily. 05/14/24  Yes Doll Frazee K, PA-C  Oxycodone  HCl 10 MG TABS Take 10 mg by mouth 4 (four) times daily as needed (pain). 07/08/22  Yes [provider]  pantoprazole  (PROTONIX ) 40 MG tablet Take 40 mg by mouth daily. 02/19/24  Yes [provider]  rosuvastatin  (CRESTOR ) 20 MG tablet Take 20 mg by mouth at bedtime. 07/29/23  Yes [provider]  SYMBICORT 160-4.5 MCG/ACT inhaler SMARTSIG:2 inhalation Via Inhaler Twice Daily PRN 04/24/24  Yes [provider]  topiramate  (TOPAMAX ) 200 MG tablet Take 200 mg by mouth at bedtime. 01/28/19  Yes [provider]  traZODone  (DESYREL ) 100 MG tablet Take 200 mg by mouth at bedtime. 02/24/19  Yes [provider]  nitroGLYCERIN  (NITROSTAT ) 0.4 MG SL tablet Place 1 tablet (0.4 mg total) under the tongue every 5 (five) minutes x 3 doses as needed for chest pain. 05/09/22   Zhao, Xika, NP    Family History Family History  Problem Relation Age of Onset   Bipolar disorder Mother    CVA Mother    Bipolar disorder Son    Depression Brother        schizoaffective d/o   Hypertension Sister    Heart disease Maternal Aunt    Heart attack Maternal Aunt    Hypertension Brother     Social History Social History   Tobacco Use   Smoking status: Former    Current packs/day: 0.00    Average packs/day: 1 pack/day for 30.0 years (30.0 ttl pk-yrs)    Types: Cigarettes    Start date: 09/22/1992    Quit date: 09/22/2022    Years since quitting: 1.6   Smokeless tobacco: Never  Vaping Use   Vaping status: Never Used  Substance Use Topics   Alcohol use: No   Drug use: No     Allergies   Lyrica [pregabalin], Rocephin  [ceftriaxone  sodium in dextrose ], Sulfa antibiotics, Ultram [tramadol], Vancomycin, Glucotrol  [glipizide ], Keflex  [cephalexin ], Penicillins, and Janumet  [sitagliptin  phos-metformin  hcl]   Review of Systems Review of Systems  Constitutional:  Positive for activity change. Negative for appetite change, fatigue and fever.  Musculoskeletal:  Positive for arthralgias, gait problem and joint swelling. Negative for myalgias.  Skin:  Positive for wound. Negative for color change.  Neurological:  Negative for dizziness, syncope, weakness, light-headedness, numbness and headaches.     Physical Exam Triage Vital Signs ED Triage Vitals  Encounter Vitals Group     BP 05/14/24 1816 106/68     Systolic BP Percentile --      Diastolic BP Percentile --      Pulse Rate 05/14/24 1816 66     Resp 05/14/24 1816 18     Temp 05/14/24 1816 98.4 F (36.9 C)     Temp Source 05/14/24 1816 Oral     SpO2 05/14/24 1816 97 %     Weight --      Height --      Head Circumference --      Peak  Flow --      Pain Score 05/14/24 1812 10  Pain Loc --      Pain Education --      Exclude from Growth Chart --    No data found.  Updated Vital Signs BP 106/68 (BP Location: Left Arm)   Pulse 66   Temp 98.4 F (36.9 C) (Oral)   Resp 18   SpO2 97%   Visual Acuity Right Eye Distance:   Left Eye Distance:   Bilateral Distance:    Right Eye Near:   Left Eye Near:    Bilateral Near:     Physical Exam Vitals reviewed.  Constitutional:      General: She is awake. She is not in acute distress.    Appearance: Normal appearance. She is well-developed. She is not ill-appearing.     Comments: Very pleasant female appears stated age sitting in wheelchair eating a snack in no acute distress  HENT:     Head: Normocephalic and atraumatic.  Cardiovascular:     Rate and Rhythm: Normal rate and regular rhythm.     Pulses:          Posterior tibial pulses are 2+ on the left side.     Heart sounds: Normal heart sounds, S1 normal and S2 normal. No murmur heard. Pulmonary:     Effort: Pulmonary effort is normal.     Breath sounds: Examination of the right-lower field reveals decreased breath sounds. Examination of the left-lower field reveals decreased breath sounds. Decreased breath sounds present. No wheezing, rhonchi or rales.  Musculoskeletal:     Left knee: Bony tenderness present. No swelling or deformity. Tenderness present over the medial joint line and lateral joint line. No LCL laxity, MCL laxity, ACL laxity or PCL laxity.    Comments: Left knee: Approximately 2 cm x 1 cm abrasion noted over tibial tuberosity without active bleeding.  Tender to palpation over inferior joint line.  Patient had difficulty tolerating ligamentous exam but no obvious laxity.  Strength 5/5 at knee.  Psychiatric:        Behavior: Behavior is cooperative.      UC Treatments / Results  Labs (all labs ordered are listed, but only abnormal results are displayed) Labs Reviewed - No data to  display  EKG   Radiology DG Knee Complete 4 Views Left Result Date: 05/14/2024 CLINICAL DATA:  Left knee contusion. EXAM: LEFT KNEE - COMPLETE 4+ VIEW COMPARISON:  None Available. FINDINGS: No evidence of fracture, dislocation, or joint effusion. No evidence of arthropathy or other focal bone abnormality. Soft tissues are unremarkable. IMPRESSION: Negative. Electronically Signed   By: Rosalene Colon M.D.   On: 05/14/2024 18:59    Procedures Procedures (including critical care time)  Medications Ordered in UC Medications - No data to display  Initial Impression / Assessment and Plan / UC Course  I have reviewed the triage vital signs and the nursing notes.  Pertinent labs & imaging results that were available during my care of the patient were reviewed by me and considered in my medical decision making (see chart for details).     Patient is well-appearing, afebrile, nontoxic, nontachycardic.  X-ray was obtained given mechanism of injury that showed no acute osseous abnormality.  Suspect contusion as etiology of symptoms.  She does have a small abrasion and so this was dressed in clinic.  Her last Tdap in epic was in 2018 but she reports that she is confident she has had it within the past 5 years so this was not updated today.  She was encouraged  to keep the area clean and apply Bactroban  ointment twice daily dressing changes.  She was placed in a brace for comfort and support.  Recommended RICE protocol.  She was given prescription for Naprosyn  to be used twice daily for up to 10 days but we discussed that given her history of stomach ulcers she should limit use as much as possible and is important she not take any additional NSAIDs with this medication.  She is currently prescribed tonics and will continue taking this medication and was encouraged to take this medication with food.  Recommend close follow-up with orthopedics and was given the contact information for local provider with  instruction to call to schedule an appointment.  We discussed that if anything worsens or changes she needs to be seen immediately.  Strict return precautions given.  Excuse note provided.  Final Clinical Impressions(s) / UC Diagnoses   Final diagnoses:  Acute pain of left knee  Contusion of left knee, initial encounter  Abrasion, left knee, initial encounter     Discharge Instructions      Your x-ray was normal with no evidence of a broken bone.  Keep your knee elevated and use ice for 15 minutes at a time 3-4 times per day.  Use the brace for comfort and support.  Take Naprosyn  for pain.  Do not take additional NSAIDs with this medication including aspirin , ibuprofen /Advil , naproxen /Aleve .  You can take your previously prescribed oxycodone .  Keep the wound on your knee clean with soap and water.  Apply mupirocin  twice daily.  If you have any signs of infection including redness, bleeding, drainage you need to be seen immediately.  Follow-up with orthopedics as soon as possible; call to schedule an appointment.   ED Prescriptions     Medication Sig Dispense Auth. Provider   mupirocin  ointment (BACTROBAN ) 2 % Apply 1 Application topically 2 (two) times daily. 22 g Marlena Barbato K, PA-C   naproxen  (NAPROSYN ) 375 MG tablet Take 1 tablet (375 mg total) by mouth 2 (two) times daily. 20 tablet Shanaia Sievers K, PA-C      I have reviewed the PDMP during this encounter.   Budd Cargo, PA-C 05/14/24 1913

## 2024-05-14 NOTE — Discharge Instructions (Addendum)
 Your x-ray was normal with no evidence of a broken bone.  Keep your knee elevated and use ice for 15 minutes at a time 3-4 times per day.  Use the brace for comfort and support.  Take Naprosyn  for pain.  Do not take additional NSAIDs with this medication including aspirin , ibuprofen /Advil , naproxen /Aleve .  You can take your previously prescribed oxycodone .  Keep the wound on your knee clean with soap and water.  Apply mupirocin  twice daily.  If you have any signs of infection including redness, bleeding, drainage you need to be seen immediately.  Follow-up with orthopedics as soon as possible; call to schedule an appointment.

## 2024-05-14 NOTE — ED Triage Notes (Signed)
 Patient presenting with left knee injury onset today at work. Patient was pushing a cart that caught on a crack in the concrete and fell forward. No head injury or LOC. Caught her weight with the left knee and left arm.   Prescriptions or OTC medications tried: No

## 2024-07-18 ENCOUNTER — Encounter (HOSPITAL_COMMUNITY): Payer: Self-pay

## 2024-07-18 ENCOUNTER — Emergency Department (HOSPITAL_COMMUNITY)

## 2024-07-18 ENCOUNTER — Inpatient Hospital Stay (HOSPITAL_COMMUNITY)
Admission: EM | Admit: 2024-07-18 | Discharge: 2024-07-21 | DRG: 690 | Disposition: A | Discharge status: Home / Self Care | Attending: Internal Medicine | Admitting: Internal Medicine

## 2024-07-18 ENCOUNTER — Observation Stay (HOSPITAL_COMMUNITY)

## 2024-07-18 ENCOUNTER — Other Ambulatory Visit: Payer: Self-pay

## 2024-07-18 DIAGNOSIS — A419 Sepsis, unspecified organism: Secondary | ICD-10-CM | POA: Diagnosis not present

## 2024-07-18 DIAGNOSIS — F319 Bipolar disorder, unspecified: Secondary | ICD-10-CM | POA: Diagnosis present

## 2024-07-18 DIAGNOSIS — N390 Urinary tract infection, site not specified: Secondary | ICD-10-CM

## 2024-07-18 DIAGNOSIS — I951 Orthostatic hypotension: Secondary | ICD-10-CM | POA: Diagnosis present

## 2024-07-18 DIAGNOSIS — Z823 Family history of stroke: Secondary | ICD-10-CM

## 2024-07-18 DIAGNOSIS — E860 Dehydration: Secondary | ICD-10-CM | POA: Diagnosis present

## 2024-07-18 DIAGNOSIS — Z888 Allergy status to other drugs, medicaments and biological substances status: Secondary | ICD-10-CM

## 2024-07-18 DIAGNOSIS — Z8249 Family history of ischemic heart disease and other diseases of the circulatory system: Secondary | ICD-10-CM

## 2024-07-18 DIAGNOSIS — N200 Calculus of kidney: Secondary | ICD-10-CM | POA: Diagnosis present

## 2024-07-18 DIAGNOSIS — E876 Hypokalemia: Secondary | ICD-10-CM | POA: Diagnosis not present

## 2024-07-18 DIAGNOSIS — R55 Syncope and collapse: Secondary | ICD-10-CM | POA: Diagnosis present

## 2024-07-18 DIAGNOSIS — K219 Gastro-esophageal reflux disease without esophagitis: Secondary | ICD-10-CM | POA: Diagnosis present

## 2024-07-18 DIAGNOSIS — Z882 Allergy status to sulfonamides status: Secondary | ICD-10-CM

## 2024-07-18 DIAGNOSIS — J42 Unspecified chronic bronchitis: Secondary | ICD-10-CM | POA: Diagnosis present

## 2024-07-18 DIAGNOSIS — Z6832 Body mass index (BMI) 32.0-32.9, adult: Secondary | ICD-10-CM

## 2024-07-18 DIAGNOSIS — Z881 Allergy status to other antibiotic agents status: Secondary | ICD-10-CM

## 2024-07-18 DIAGNOSIS — K5909 Other constipation: Secondary | ICD-10-CM | POA: Diagnosis present

## 2024-07-18 DIAGNOSIS — I10 Essential (primary) hypertension: Secondary | ICD-10-CM | POA: Diagnosis present

## 2024-07-18 DIAGNOSIS — Z9641 Presence of insulin pump (external) (internal): Secondary | ICD-10-CM | POA: Diagnosis present

## 2024-07-18 DIAGNOSIS — E66811 Obesity, class 1: Secondary | ICD-10-CM | POA: Diagnosis present

## 2024-07-18 DIAGNOSIS — R6520 Severe sepsis without septic shock: Secondary | ICD-10-CM | POA: Diagnosis not present

## 2024-07-18 DIAGNOSIS — Z66 Do not resuscitate: Secondary | ICD-10-CM | POA: Diagnosis present

## 2024-07-18 DIAGNOSIS — N3090 Cystitis, unspecified without hematuria: Principal | ICD-10-CM | POA: Diagnosis present

## 2024-07-18 DIAGNOSIS — Z880 Allergy status to penicillin: Secondary | ICD-10-CM

## 2024-07-18 DIAGNOSIS — Z87891 Personal history of nicotine dependence: Secondary | ICD-10-CM

## 2024-07-18 DIAGNOSIS — N179 Acute kidney failure, unspecified: Secondary | ICD-10-CM | POA: Diagnosis present

## 2024-07-18 DIAGNOSIS — Z794 Long term (current) use of insulin: Secondary | ICD-10-CM

## 2024-07-18 DIAGNOSIS — Z885 Allergy status to narcotic agent status: Secondary | ICD-10-CM

## 2024-07-18 DIAGNOSIS — I252 Old myocardial infarction: Secondary | ICD-10-CM

## 2024-07-18 DIAGNOSIS — F419 Anxiety disorder, unspecified: Secondary | ICD-10-CM | POA: Diagnosis present

## 2024-07-18 DIAGNOSIS — Z79899 Other long term (current) drug therapy: Secondary | ICD-10-CM

## 2024-07-18 DIAGNOSIS — M545 Low back pain, unspecified: Secondary | ICD-10-CM | POA: Diagnosis present

## 2024-07-18 DIAGNOSIS — Z8673 Personal history of transient ischemic attack (TIA), and cerebral infarction without residual deficits: Secondary | ICD-10-CM

## 2024-07-18 DIAGNOSIS — E785 Hyperlipidemia, unspecified: Secondary | ICD-10-CM | POA: Diagnosis present

## 2024-07-18 DIAGNOSIS — Z79891 Long term (current) use of opiate analgesic: Secondary | ICD-10-CM

## 2024-07-18 DIAGNOSIS — G4733 Obstructive sleep apnea (adult) (pediatric): Secondary | ICD-10-CM | POA: Diagnosis present

## 2024-07-18 DIAGNOSIS — Z7982 Long term (current) use of aspirin: Secondary | ICD-10-CM

## 2024-07-18 DIAGNOSIS — I2510 Atherosclerotic heart disease of native coronary artery without angina pectoris: Secondary | ICD-10-CM | POA: Diagnosis present

## 2024-07-18 DIAGNOSIS — Z7951 Long term (current) use of inhaled steroids: Secondary | ICD-10-CM

## 2024-07-18 DIAGNOSIS — G8929 Other chronic pain: Secondary | ICD-10-CM | POA: Diagnosis present

## 2024-07-18 DIAGNOSIS — E872 Acidosis, unspecified: Secondary | ICD-10-CM | POA: Diagnosis present

## 2024-07-18 DIAGNOSIS — E1165 Type 2 diabetes mellitus with hyperglycemia: Secondary | ICD-10-CM | POA: Diagnosis present

## 2024-07-18 DIAGNOSIS — Z955 Presence of coronary angioplasty implant and graft: Secondary | ICD-10-CM

## 2024-07-18 LAB — COMPREHENSIVE METABOLIC PANEL WITH GFR
ALT: 12 U/L (ref 0–44)
AST: 15 U/L (ref 15–41)
Albumin: 3.6 g/dL (ref 3.5–5.0)
Alkaline Phosphatase: 91 U/L (ref 38–126)
Anion gap: 11 (ref 5–15)
BUN: 29 mg/dL — ABNORMAL HIGH (ref 6–20)
CO2: 20 mmol/L — ABNORMAL LOW (ref 22–32)
Calcium: 8.7 mg/dL — ABNORMAL LOW (ref 8.9–10.3)
Chloride: 106 mmol/L (ref 98–111)
Creatinine, Ser: 2.9 mg/dL — ABNORMAL HIGH (ref 0.44–1.00)
GFR, Estimated: 19 mL/min — ABNORMAL LOW (ref >60)
Glucose, Bld: 281 mg/dL — ABNORMAL HIGH (ref 70–99)
Potassium: 3.7 mmol/L (ref 3.5–5.1)
Sodium: 137 mmol/L (ref 135–145)
Total Bilirubin: 0.6 mg/dL (ref 0.0–1.2)
Total Protein: 6.9 g/dL (ref 6.5–8.1)

## 2024-07-18 LAB — CBC WITH DIFFERENTIAL/PLATELET
Abs Immature Granulocytes: 0.04 K/uL (ref 0.00–0.07)
Basophils Absolute: 0 K/uL (ref 0.0–0.1)
Basophils Relative: 0 %
Eosinophils Absolute: 0.2 K/uL (ref 0.0–0.5)
Eosinophils Relative: 2 %
HCT: 42.3 % (ref 36.0–46.0)
Hemoglobin: 13.6 g/dL (ref 12.0–15.0)
Immature Granulocytes: 0 %
Lymphocytes Relative: 24 %
Lymphs Abs: 2.3 K/uL (ref 0.7–4.0)
MCH: 26.9 pg (ref 26.0–34.0)
MCHC: 32.2 g/dL (ref 30.0–36.0)
MCV: 83.8 fL (ref 80.0–100.0)
Monocytes Absolute: 0.7 K/uL (ref 0.1–1.0)
Monocytes Relative: 8 %
Neutro Abs: 6.4 K/uL (ref 1.7–7.7)
Neutrophils Relative %: 66 %
Platelets: 206 K/uL (ref 150–400)
RBC: 5.05 MIL/uL (ref 3.87–5.11)
RDW: 15.8 % — ABNORMAL HIGH (ref 11.5–15.5)
WBC: 9.7 K/uL (ref 4.0–10.5)
nRBC: 0 % (ref 0.0–0.2)

## 2024-07-18 LAB — I-STAT CHEM 8, ED
BUN: 30 mg/dL — ABNORMAL HIGH (ref 6–20)
Calcium, Ion: 1.12 mmol/L — ABNORMAL LOW (ref 1.15–1.40)
Chloride: 105 mmol/L (ref 98–111)
Creatinine, Ser: 3.1 mg/dL — ABNORMAL HIGH (ref 0.44–1.00)
Glucose, Bld: 275 mg/dL — ABNORMAL HIGH (ref 70–99)
HCT: 43 % (ref 36.0–46.0)
Hemoglobin: 14.6 g/dL (ref 12.0–15.0)
Potassium: 3.5 mmol/L (ref 3.5–5.1)
Sodium: 136 mmol/L (ref 135–145)
TCO2: 18 mmol/L — ABNORMAL LOW (ref 22–32)

## 2024-07-18 LAB — URINALYSIS, W/ REFLEX TO CULTURE (INFECTION SUSPECTED)
Glucose, UA: NEGATIVE mg/dL
Hgb urine dipstick: NEGATIVE
Ketones, ur: NEGATIVE mg/dL
Nitrite: NEGATIVE
Protein, ur: 100 mg/dL — AB
RBC / HPF: >50 RBC/hpf (ref 0–5)
Specific Gravity, Urine: 1.026 (ref 1.005–1.030)
Squamous Epithelial / HPF: >50 /HPF (ref 0–5)
WBC, UA: >50 WBC/hpf (ref 0–5)
pH: 5 (ref 5.0–8.0)

## 2024-07-18 LAB — HEMOGLOBIN A1C
Hgb A1c MFr Bld: 9.9 % — ABNORMAL HIGH (ref 4.8–5.6)
Mean Plasma Glucose: 237.43 mg/dL

## 2024-07-18 LAB — LIPASE, BLOOD: Lipase: 31 U/L (ref 11–51)

## 2024-07-18 LAB — I-STAT CG4 LACTIC ACID, ED
Lactic Acid, Venous: 2.4 mmol/L (ref 0.5–1.9)
Lactic Acid, Venous: 2 mmol/L (ref 0.5–1.9)

## 2024-07-18 LAB — TROPONIN I (HIGH SENSITIVITY)
Troponin I (High Sensitivity): 6 ng/L (ref <18)
Troponin I (High Sensitivity): 5 ng/L (ref <18)

## 2024-07-18 LAB — GLUCOSE, CAPILLARY
Glucose-Capillary: 179 mg/dL — ABNORMAL HIGH (ref 70–99)
Glucose-Capillary: 269 mg/dL — ABNORMAL HIGH (ref 70–99)

## 2024-07-18 LAB — CBG MONITORING, ED: Glucose-Capillary: 244 mg/dL — ABNORMAL HIGH (ref 70–99)

## 2024-07-18 LAB — PROCALCITONIN: Procalcitonin: <0.1 ng/mL

## 2024-07-18 LAB — CREATININE, URINE, RANDOM: Creatinine, Urine: 249 mg/dL

## 2024-07-18 LAB — SODIUM, URINE, RANDOM: Sodium, Ur: 17 mmol/L

## 2024-07-18 MED ORDER — GABAPENTIN 300 MG PO CAPS
300.0000 mg | ORAL_CAPSULE | Freq: Three times a day (TID) | ORAL | Status: DC
Start: 2024-07-18 — End: 2024-07-21
  Administered 2024-07-18 – 2024-07-21 (×8): 300 mg via ORAL
  Filled 2024-07-18 (×8): qty 1

## 2024-07-18 MED ORDER — ROSUVASTATIN CALCIUM 20 MG PO TABS
20.0000 mg | ORAL_TABLET | Freq: Every day | ORAL | Status: DC
  Administered 2024-07-18 – 2024-07-20 (×3): 20 mg via ORAL
  Filled 2024-07-18 (×3): qty 1

## 2024-07-18 MED ORDER — METOPROLOL TARTRATE 5 MG/5ML IV SOLN: 5.0000 mg | Freq: Four times a day (QID) | INTRAVENOUS | Status: DC | PRN

## 2024-07-18 MED ORDER — LACTATED RINGERS IV BOLUS
30.0000 mL/kg | Freq: Once | INTRAVENOUS | Status: AC
  Administered 2024-07-18: 2448 mL via INTRAVENOUS

## 2024-07-18 MED ORDER — ONDANSETRON HCL 4 MG PO TABS
4.0000 mg | ORAL_TABLET | Freq: Four times a day (QID) | ORAL | Status: DC | PRN
Start: 2024-07-18 — End: 2024-07-21

## 2024-07-18 MED ORDER — ACETAMINOPHEN 325 MG PO TABS
650.0000 mg | ORAL_TABLET | Freq: Four times a day (QID) | ORAL | Status: DC | PRN
Start: 2024-07-18 — End: 2024-07-21

## 2024-07-18 MED ORDER — SODIUM CHLORIDE 0.9 % IV SOLN
1.0000 g | Freq: Once | INTRAVENOUS | Status: AC
  Administered 2024-07-18: 1 g via INTRAVENOUS
  Filled 2024-07-18: qty 20

## 2024-07-18 MED ORDER — ONDANSETRON HCL 4 MG/2ML IJ SOLN: 4.0000 mg | Freq: Four times a day (QID) | INTRAMUSCULAR | Status: DC | PRN

## 2024-07-18 MED ORDER — INSULIN GLARGINE-YFGN 100 UNIT/ML ~~LOC~~ SOLN
4.0000 [IU] | Freq: Every day | SUBCUTANEOUS | Status: DC
  Administered 2024-07-18 – 2024-07-21 (×4): 4 [IU] via SUBCUTANEOUS
  Filled 2024-07-18 (×4): qty 0.04

## 2024-07-18 MED ORDER — AMITRIPTYLINE HCL 25 MG PO TABS
150.0000 mg | ORAL_TABLET | Freq: Every day | ORAL | Status: DC
  Administered 2024-07-18 – 2024-07-19 (×2): 150 mg via ORAL
  Filled 2024-07-18 (×3): qty 6

## 2024-07-18 MED ORDER — SODIUM CHLORIDE 0.9 % IV SOLN
2.0000 g | Freq: Once | INTRAVENOUS | Status: AC
  Administered 2024-07-18: 2 g via INTRAVENOUS
  Filled 2024-07-18: qty 10

## 2024-07-18 MED ORDER — TRAZODONE HCL 100 MG PO TABS
200.0000 mg | ORAL_TABLET | Freq: Every day | ORAL | Status: DC
  Administered 2024-07-18 – 2024-07-19 (×2): 200 mg via ORAL
  Filled 2024-07-18 (×3): qty 2

## 2024-07-18 MED ORDER — OXYCODONE HCL 5 MG PO TABS
10.0000 mg | ORAL_TABLET | Freq: Four times a day (QID) | ORAL | Status: DC | PRN
  Administered 2024-07-18 – 2024-07-20 (×3): 10 mg via ORAL
  Filled 2024-07-18 (×3): qty 2

## 2024-07-18 MED ORDER — SODIUM CHLORIDE 0.9 % IV SOLN
1.0000 g | Freq: Two times a day (BID) | INTRAVENOUS | Status: DC
  Administered 2024-07-19 – 2024-07-20 (×3): 1 g via INTRAVENOUS
  Filled 2024-07-18 (×4): qty 20

## 2024-07-18 MED ORDER — LURASIDONE HCL 40 MG PO TABS
80.0000 mg | ORAL_TABLET | Freq: Every evening | ORAL | Status: DC
  Administered 2024-07-18 – 2024-07-20 (×3): 80 mg via ORAL
  Filled 2024-07-18 (×3): qty 2

## 2024-07-18 MED ORDER — ACETAMINOPHEN 650 MG RE SUPP: 650.0000 mg | Freq: Four times a day (QID) | RECTAL | Status: DC | PRN

## 2024-07-18 MED ORDER — BUSPIRONE HCL 5 MG PO TABS
15.0000 mg | ORAL_TABLET | Freq: Three times a day (TID) | ORAL | Status: DC
Start: 2024-07-18 — End: 2024-07-21
  Administered 2024-07-18 – 2024-07-21 (×8): 15 mg via ORAL
  Filled 2024-07-18 (×8): qty 3

## 2024-07-18 MED ORDER — ASPIRIN 81 MG PO CHEW
81.0000 mg | CHEWABLE_TABLET | Freq: Every day | ORAL | Status: DC
Start: 2024-07-18 — End: 2024-07-21
  Administered 2024-07-18 – 2024-07-21 (×4): 81 mg via ORAL
  Filled 2024-07-18 (×4): qty 1

## 2024-07-18 MED ORDER — INSULIN ASPART 100 UNIT/ML IJ SOLN
0.0000 [IU] | Freq: Three times a day (TID) | INTRAMUSCULAR | Status: DC
  Administered 2024-07-18: 2 [IU] via SUBCUTANEOUS
  Administered 2024-07-19: 3 [IU] via SUBCUTANEOUS
  Administered 2024-07-19: 2 [IU] via SUBCUTANEOUS
  Administered 2024-07-19: 3 [IU] via SUBCUTANEOUS
  Administered 2024-07-20 (×2): 2 [IU] via SUBCUTANEOUS
  Administered 2024-07-20: 7 [IU] via SUBCUTANEOUS
  Administered 2024-07-21: 9 [IU] via SUBCUTANEOUS
  Filled 2024-07-18: qty 0.09

## 2024-07-18 MED ORDER — ENOXAPARIN SODIUM 30 MG/0.3ML IJ SOSY
30.0000 mg | PREFILLED_SYRINGE | Freq: Every day | INTRAMUSCULAR | Status: DC
  Administered 2024-07-18: 30 mg via SUBCUTANEOUS
  Filled 2024-07-18 (×2): qty 0.3

## 2024-07-18 MED ORDER — LACTATED RINGERS IV SOLN: INTRAVENOUS | Status: AC

## 2024-07-18 MED ORDER — PANTOPRAZOLE SODIUM 40 MG PO TBEC
40.0000 mg | DELAYED_RELEASE_TABLET | Freq: Every day | ORAL | Status: DC
  Administered 2024-07-18 – 2024-07-21 (×4): 40 mg via ORAL
  Filled 2024-07-18 (×4): qty 1

## 2024-07-18 NOTE — Assessment & Plan Note (Signed)
Cpap nightly.

## 2024-07-18 NOTE — Assessment & Plan Note (Signed)
 Continue PPI.

## 2024-07-18 NOTE — H&P (Signed)
 History and Physical    Patient: Stephanie Thornton FMW:[id number] DOB: [birth year] DOA: 07/18/2024 DOS: the patient was seen and examined on 07/18/2024 PCP: Austin Mutton, MD  Patient coming from: work - lives with her son. Uses cane/walker at times.    Chief Complaint: syncope and not feeling well with N/V and poor po intake x 2 days  HPI: Stephanie Thornton is a 53 y.o. female with medical history significant of T2DM on insulin  pump, HTN, CAD, bipolar d/o, GERD, HLD, hx of CVA who presented to ED after syncopal episode while working. Her son gives some of the history as he was at work with her. She tells me she had felt bad the past 2 days with chills, vomiting and some diarrhea. She has had about 3-4 episodes of vomiting/day with little to no PO intake. She just has felt unwell. She was treated for a UTI four days ago, but continues to have dysuria, urgency and frequency. Today at work she kept telling her son she was hot and didn't feel well and couldn't move her shoulder. 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 Didn't hit her head. She was in and out for about 3 minutes. She states she felt light headed/dizzy and hot with some chest pain prior to passing out.   Denies any fever, +chills.  vision changes/headaches, shortness of breath or cough, abdominal pain, or leg swelling.   Recently stopped smoking. She does not drink alcohol.   ER Course:  vitals: afebrile, bp: 128/90>83/54, HR: 82, RR: 14, oxygen: 94%RA Pertinent labs: BUN: 29, creatinine: 2.90, lactic acid: 2.4,  CXR: no active disease CT renal stone: Minimal bladder wall thickening, can be seen with cystitis. 2. Nonobstructing left renal stone. Punctate calcifications of both renal hila may represent nonobstructing stones or vascular calcifications. No hydronephrosis. In ED: code sepsis activated. Given 30ml/kg IVF bolus, cultures obtained and given aztreonam  for UTI. TRH  asked to admit.    Review of Systems: As mentioned in the history of present illness. All other systems reviewed and are negative. Past Medical History:  Diagnosis Date   Anxiety    Arthritis    knees, feet, back (05/24/2015)   Benign essential HTN 05/01/2015   Bipolar disorder (HCC) diagnosed early 90s   CAD in native artery    Chronic bronchitis (HCC)    get it q yr   Chronic lower back pain    Depression    Diabetes mellitus type II 2010   GERD (gastroesophageal reflux disease)    Headache    maybe 3 times/wk (05/24/2015)   History of hiatal hernia    History of stomach ulcers    HLD (hyperlipidemia)    Menopause    Migraine    1-2/wk (05/24/2015)   NSTEMI (non-ST elevated myocardial infarction) (HCC) 04/2022   OSA on CPAP    Stroke (HCC)    r sided weakness   Past Surgical History:  Procedure Laterality Date   ABDOMINAL HYSTERECTOMY  2007   BUNIONECTOMY Bilateral    CARPAL TUNNEL RELEASE Right    CORONARY STENT INTERVENTION N/A 05/07/2022   Procedure: CORONARY STENT INTERVENTION;  Surgeon: Claudene Stephanie ORN, MD;  Location: [facility];  Service: Cardiovascular;  Laterality: N/A;   HAMMER TOE SURGERY Left    LEFT HEART CATH AND CORONARY ANGIOGRAPHY N/A 05/20/2018   Procedure: LEFT HEART CATH AND CORONARY ANGIOGRAPHY;  Surgeon: Anner Alm ORN,  MD;  Location: [facility];  Service: Cardiovascular;  Laterality: N/A;   LEFT HEART CATH AND CORONARY ANGIOGRAPHY N/A 05/07/2022   Procedure: LEFT HEART CATH AND CORONARY ANGIOGRAPHY;  Surgeon: Claudene Stephanie ORN, MD;  Location: [facility];  Service: Cardiovascular;  Laterality: N/A;   TONSILLECTOMY Bilateral 05/24/2015   Procedure: TONSILLECTOMY;  Surgeon: Vaughan Ricker, MD;  Location: [facility] OR;  Service: ENT;  Laterality: Bilateral;   Social History:  reports that she quit smoking about 21 months ago. Her smoking use included cigarettes. She started smoking about 31 years ago. She has a 30 pack-year smoking  history. She has never used smokeless tobacco. She reports that she does not drink alcohol and does not use drugs.  Allergies  Allergen Reactions   Lyrica [Pregabalin] Shortness Of Breath, Itching and Rash   Rocephin  [Ceftriaxone  Sodium In Dextrose ] Shortness Of Breath   Sulfa Antibiotics Hives, Shortness Of Breath and Itching   Ultram [Tramadol] Hives and Shortness Of Breath    Had asthma attack   Vancomycin Anaphylaxis    Per Dr. Precilla   Glucotrol  [Glipizide ] Hives   Keflex  [Cephalexin ] Hives   Penicillins Hives    Has patient had a PCN reaction causing immediate rash, facial/tongue/throat swelling, SOB or lightheadedness with hypotension: YES Has patient had a PCN reaction causing severe rash involving mucus membranes or skin necrosis: NO Has patient had a PCN reaction that required hospitalization: YES Has patient had a PCN reaction occurring within the last 10 years: YES If all of the above answers are NO, then may proceed with Cephalosporin use.   Janumet  [Sitagliptin  Phos-Metformin  Hcl] Nausea Only    Family History  Problem Relation Age of Onset   Bipolar disorder Mother    CVA Mother    Bipolar disorder Son    Depression Brother        schizoaffective d/o   Hypertension Sister    Heart disease Maternal Aunt    Heart attack Maternal Aunt    Hypertension Brother     Prior to Admission medications   Medication Sig Start Date End Date Taking? Authorizing Provider  Acetaminophen  (TYLENOL  PO) Take 2 tablets by mouth 3 (three) times daily as needed (tooth ache).    [provider]  amitriptyline  (ELAVIL ) 150 MG tablet Take 150 mg by mouth at bedtime. 08/06/22   [provider]  amLODipine  (NORVASC ) 10 MG tablet Take 1 tablet (10 mg total) by mouth daily. 09/23/22   Shlomo Wilbert SAUNDERS, MD  aspirin  81 MG chewable tablet Chew 1 tablet (81 mg total) by mouth daily. 05/09/22   Zhao, Xika, NP  atenolol  (TENORMIN ) 50 MG tablet Take 50 mg by mouth daily. 10/24/23    [provider]  baclofen  (LIORESAL ) 10 MG tablet Take 10 mg by mouth 3 (three) times daily. 05/05/22   [provider]  busPIRone  (BUSPAR ) 10 MG tablet Take 10 mg by mouth 3 (three) times daily. 01/25/19   [provider]  Continuous Glucose Sensor (DEXCOM G6 SENSOR) MISC SMARTSIG:1 Each Topical Every 10 Days 09/07/23   [provider]  Continuous Glucose Transmitter (DEXCOM G6 TRANSMITTER) MISC USE AS DIRECTED EVERY 90 DAYS 09/05/23   [provider]  gabapentin  (NEURONTIN ) 300 MG capsule Take 1 capsule (300 mg total) by mouth 3 (three) times daily. 11/11/22     HUMALOG 100 UNIT/ML injection 60 UNITS DAILY VIA INSULIN  PUMP INJECTION 30 DAYS    [provider]  Insulin  Disposable Pump (OMNIPOD 5 DEXG7G6  PODS GEN 5) MISC SMARTSIG:1 SUB-Q Every 3 Days 04/24/24   [provider]  Insulin  Pen Needle 31G X 5 MM MISC 1 Units by Does not apply route 4 (four) times daily. 09/07/23   Rojelio Nest, DO  LATUDA  80 MG TABS tablet Take 80 mg by mouth every evening. 02/01/19   [provider]  lidocaine  (LIDODERM ) 5 % Place 1 patch onto the skin daily. Remove & Discard patch within 12 hours or as directed by MD 12/31/23   Ruthe Cornet, DO  lisinopril -hydrochlorothiazide  (ZESTORETIC ) 20-12.5 MG tablet Take 1 tablet by mouth daily.    [provider]  mupirocin  ointment (BACTROBAN ) 2 % Apply 1 Application topically 2 (two) times daily. 05/14/24   Raspet, Erin K, PA-C  naproxen  (NAPROSYN ) 375 MG tablet Take 1 tablet (375 mg total) by mouth 2 (two) times daily. 05/14/24   Raspet, Erin K, PA-C  nitroGLYCERIN  (NITROSTAT ) 0.4 MG SL tablet Place 1 tablet (0.4 mg total) under the tongue every 5 (five) minutes x 3 doses as needed for chest pain. 05/09/22   Zhao, Xika, NP  Oxycodone  HCl 10 MG TABS Take 10 mg by mouth 4 (four) times daily as needed (pain). 07/08/22   [provider]  pantoprazole  (PROTONIX ) 40 MG tablet Take 40 mg by mouth daily.  02/19/24   [provider]  rosuvastatin  (CRESTOR ) 20 MG tablet Take 20 mg by mouth at bedtime. 07/29/23   [provider]  SYMBICORT 160-4.5 MCG/ACT inhaler SMARTSIG:2 inhalation Via Inhaler Twice Daily PRN 04/24/24   [provider]  topiramate  (TOPAMAX ) 200 MG tablet Take 200 mg by mouth at bedtime. 01/28/19   [provider]  traZODone  (DESYREL ) 100 MG tablet Take 200 mg by mouth at bedtime. 02/24/19   [provider]    Physical Exam: Vitals:   07/18/24 1400 07/18/24 1420 07/18/24 1515 07/18/24 1600  BP: 113/69 113/88 112/78 113/67  Pulse: 78 81 83 80  Resp: 20 20 20 20   Temp:      TempSrc:      SpO2: 98% 95% 94% 96%  Weight:      Height:       General:  Appears calm and comfortable and is in NAD Eyes:  PERRL, EOMI, normal lids, iris ENT:  grossly normal hearing, lips & tongue, dry mucous membranes; appropriate dentition Neck:  no LAD, masses or thyromegaly; no carotid bruits Cardiovascular:  RRR, no m/r/g. No LE edema.  Respiratory:   CTA bilaterally with no wheezes/rales/rhonchi.  Normal respiratory effort. Abdomen:  soft, TTP epigastric area, ND, NABS Back:   normal alignment, no CVAT Skin:  no rash or induration seen on limited exam Musculoskeletal:  grossly normal tone BUE/BLE, good ROM, no bony abnormality Lower extremity:  No LE edema.  Limited foot exam with no ulcerations.  2+ distal pulses. Psychiatric:  grossly normal mood and affect, speech fluent and appropriate, AOx3 Neurologic:  CN 2-12 grossly intact, moves all extremities in coordinated fashion, sensation intact   Radiological Exams on Admission: Independently reviewed - see discussion in A/P where applicable  CT Renal Stone Study Result Date: 07/18/2024 CLINICAL DATA:  Provided history: Abdominal/flank pain, stone suspected Technologist notes state patient found on ground, weak and lethargic. Patient reports urinary tract infection, currently treated with  antibiotics. EXAM: CT ABDOMEN AND PELVIS WITHOUT CONTRAST TECHNIQUE: Multidetector CT imaging of the abdomen and pelvis was performed following the standard protocol without IV contrast. RADIATION DOSE REDUCTION: This exam was performed according to the departmental  dose-optimization program which includes automated exposure control, adjustment of the mA and/or kV according to patient size and/or use of iterative reconstruction technique. COMPARISON:  CT 08/07/2022 FINDINGS: Lower chest: Hypoventilatory changes in the dependent lung bases. No pleural effusion. Hepatobiliary: Unremarkable unenhanced appearance of the liver. Gallbladder physiologically distended, no calcified stone. No biliary dilatation. Pancreas: No ductal dilatation or inflammation. Spleen: Normal in size without focal abnormality. The low-density splenic lesion on prior exam is not seen in the absence of IV contrast. Adrenals/Urinary Tract: No adrenal nodule. 5 mm stone in the upper left kidney. Punctate calcifications of both renal hila may represent nonobstructing stones or vascular calcifications. No hydronephrosis. No evidence of renal inflammation. The urinary bladder is minimally distended. Minimal bladder wall thickening. No air in the bladder. Stomach/Bowel: No bowel obstruction or inflammation. Stomach is nondistended. The appendix is normal. Small to moderate colonic stool burden with mild colonic redundancy. Vascular/Lymphatic: Aortic and branch atherosclerosis. No aortic aneurysm. Scattered small central mesenteric lymph nodes are not enlarged by size criteria. No suspicious lymphadenopathy. Reproductive: Status post hysterectomy. No adnexal masses. Other: No free air, free fluid, or intra-abdominal fluid collection. Mild rectus diastasis with small fat containing umbilical hernia. Musculoskeletal: Scattered bone islands in the pelvis. There are no acute or suspicious osseous abnormalities. IMPRESSION: 1. Minimal bladder wall  thickening, can be seen with cystitis. 2. Nonobstructing left renal stone. Punctate calcifications of both renal hila may represent nonobstructing stones or vascular calcifications. No hydronephrosis. Aortic Atherosclerosis (ICD10-I70.0). Electronically Signed   By: Andrea Gasman M.D.   On: 07/18/2024 15:07   DG Chest Port 1 View Result Date: 07/18/2024 CLINICAL DATA:  sepsis EXAM: PORTABLE CHEST 1 VIEW COMPARISON:  Chest x-ray 09/05/2023 FINDINGS: Prominent cardiac silhouette likely due to AP portable technique. The heart and mediastinal contours are unchanged. Atherosclerotic plaque. No focal consolidation. No pulmonary edema. No pleural effusion. No pneumothorax. No acute osseous abnormality. IMPRESSION: 1. No active disease. 2.  Aortic Atherosclerosis (ICD10-I70.0). Electronically Signed   By: Morgane  Naveau M.D.   On: 07/18/2024 13:51    EKG: Independently reviewed.  NSR with rate 82; nonspecific ST changes with no evidence of acute ischemia   Labs on Admission: I have personally reviewed the available labs and imaging studies at the time of the admission.  Pertinent labs:   BUN: 29,  creatinine: 2.90,  lactic acid: 2.4,   Assessment and Plan: Principal Problem:   Severe sepsis with acute organ dysfunction (HCC) Active Problems:   Syncope   AKI (acute kidney injury) (HCC)   Uncontrolled type 2 diabetes mellitus with hyperglycemia (HCC)   Essential hypertension   Coronary artery disease   HLD (hyperlipidemia)   Bipolar disorder (HCC)   History of stroke   OSA (obstructive sleep apnea)   GERD (gastroesophageal reflux disease)    Assessment and Plan: * Severe sepsis with acute organ dysfunction (HCC) 53 year old female presenting to ED after having syncopal episode at work found to be hypotensive and meeting sepsis criteria with low CO2, tachypnea with lactic acidosis and AKI likely from UTI  -obs to progressive -given 30mg /kg IVF bolus and blood pressure  normalized -continue IVF -pan cultured  -trend PCT  -trend lactic acid  -treated with aztreonam  for UTI due to allergies in ED, changing to meropenem  for broader UTI coverage with sepsis. Discussed with pharm   Syncope -Likely related to hypotension in setting of sepsis and exogenous losses with poor PO Intake  -check troponin with complaints of chest pain prior -  on tele -CT head -echo  -orthostatic vitals q day -TED hose -continue IVF    AKI (acute kidney injury) (HCC) Creatinine baseline appears normal from 6 months ago (.8-.9) Presenting with creatinine of 2.90 UA significant for infection  CT renal stone with no post renal obstruction  Likely combination of pre renal and intra renal from exogenous losses from vomiting and poor PO intake +hypotension and medications.  Continue IVF Strict I/O Maintain MAP >65 Check urine studies  Avoid nephrotoxic drugs Trend   Uncontrolled type 2 diabetes mellitus with hyperglycemia (HCC) A1C of 14.5 from last year, repeat pending Insulin  pump taken off by EMS. Confirmed she does not have this on her  Discontinue pump while inpatient  Start sensitive SSI and accuchecks QAC/HS with AKI Start long acting insulin  at 4 units daily   Essential hypertension Hypotensive, hold anti hypertensive medications PRN IV metoprolol  for systolic > 165   Coronary artery disease Continue medical management Hold beta blocker in setting of hypotension   HLD (hyperlipidemia) Continue crestor  20mg  daily   History of stroke Continue ASA/statin   OSA (obstructive sleep apnea) Cpap nightly   GERD (gastroesophageal reflux disease) Continue PPI     Advance Care Planning:   Code Status: Limited: Do not attempt resuscitation (DNR) -DNR-LIMITED -Do Not Intubate/DNI    Consults: none   DVT Prophylaxis: lovenox   Family Communication: son at bedside   Severity of Illness: The appropriate patient status for this patient is OBSERVATION.  Observation status is judged to be reasonable and necessary in order to provide the required intensity of service to ensure the patient's safety. The patient's presenting symptoms, physical exam findings, and initial radiographic and laboratory data in the context of their medical condition is felt to place them at decreased risk for further clinical deterioration. Furthermore, it is anticipated that the patient will be medically stable for discharge from the hospital within 2 midnights of admission.   Author: Isaiah Geralds, MD 07/18/2024 5:00 PM  For on call review [email or web address].

## 2024-07-18 NOTE — Progress Notes (Signed)
   07/18/24 1842  Vitals  Patient Position (if appropriate) Orthostatic Vitals  Orthostatic Lying   BP- Lying 120/74  Pulse- Lying 81  Orthostatic Sitting  BP- Sitting 102/70  Pulse- Sitting 82  Orthostatic Standing at 0 minutes  BP- Standing at 0 minutes 91/60  Pulse- Standing at 0 minutes 83  Orthostatic Standing at 3 minutes  BP- Standing at 3 minutes 93/61  Pulse- Standing at 3 minutes 83   Pt c/o severe dizziness prior to standing up from seated position.

## 2024-07-18 NOTE — Assessment & Plan Note (Addendum)
-  Likely related to hypotension in setting of sepsis and exogenous losses with poor PO Intake  -check troponin with complaints of chest pain prior -on tele -CT head -echo  -orthostatic vitals q day -TED hose -continue IVF

## 2024-07-18 NOTE — Assessment & Plan Note (Signed)
 Continue medical management Hold beta blocker in setting of hypotension

## 2024-07-18 NOTE — Assessment & Plan Note (Signed)
 Hypotensive, hold anti hypertensive medications PRN IV metoprolol  for systolic > 165

## 2024-07-18 NOTE — Progress Notes (Signed)
   07/18/24 2250  BiPAP/CPAP/SIPAP  $ Non-Invasive Home Ventilator  Initial  $ Face Mask Medium Yes  BiPAP/CPAP/SIPAP Pt Type Adult  BiPAP/CPAP/SIPAP Resmed  Mask Type Full face mask  Dentures removed? Not applicable  Mask Size Medium  Respiratory Rate 18 breaths/min  FiO2 (%) 21 %  Patient Home Machine No  Patient Home Mask No  Patient Home Tubing No  Auto Titrate Yes  Minimum cmH2O 4 cmH2O  Maximum cmH2O 15 cmH2O  CPAP/SIPAP surface wiped down Yes  Device Plugged into RED Power Outlet Yes  BiPAP/CPAP /SiPAP Vitals  SpO2 92 %  Bilateral Breath Sounds Clear

## 2024-07-18 NOTE — Progress Notes (Signed)
 Pharmacy Antibiotic Note  Stephanie Thornton is a 53 y.o. female admitted on 07/18/2024 with sepsis.  Pharmacy has been consulted for meropenem  dosing for sepsis with urinary source  Plan: Meropenem  1 gm IV q12 hrs Pharmacy to sign off  Height: 5' 2 (157.5 cm) Weight: 81.6 kg (179 lb 14.3 oz) IBW/kg (Calculated) : 50.1  Temp (24hrs), Avg:97.8 F (36.6 C), Min:97.8 F (36.6 C), Max:97.8 F (36.6 C)  Recent Labs  Lab 07/18/24 1314 07/18/24 1322 07/18/24 1552  WBC 9.7  --   --   CREATININE 2.90* 3.10*  --   LATICACIDVEN  --  2.4* 2.0*    Estimated Creatinine Clearance: 21 mL/min (A) (by C-G formula based on SCr of 3.1 mg/dL (H)).    Allergies  Allergen Reactions   Lyrica [Pregabalin] Shortness Of Breath, Itching and Rash   Rocephin  [Ceftriaxone  Sodium In Dextrose ] Shortness Of Breath   Sulfa Antibiotics Hives, Shortness Of Breath and Itching   Ultram [Tramadol] Hives and Shortness Of Breath    Had asthma attack   Vancomycin Anaphylaxis    Per Dr. Precilla   Glucotrol  [place] ] Hives   Keflex  [Cephalexin ] Hives   Penicillins Hives    Has patient had a PCN reaction causing immediate rash, facial/tongue/throat swelling, SOB or lightheadedness with hypotension: YES Has patient had a PCN reaction causing severe rash involving mucus membranes or skin necrosis: NO Has patient had a PCN reaction that required hospitalization: YES Has patient had a PCN reaction occurring within the last 10 years: YES If all of the above answers are NO, then may proceed with Cephalosporin use.   Janumet  [Sitagliptin  Phos-Metformin  Hcl] Nausea Only   Thank you for allowing pharmacy to be a part of this patient's care.  Rosaline IVAR Edison, Pharm.D Use secure chat for questions 07/18/2024 4:48 PM

## 2024-07-18 NOTE — Assessment & Plan Note (Addendum)
 53 year old female presenting to ED after having syncopal episode at work found to be hypotensive and meeting sepsis criteria with low CO2, tachypnea with lactic acidosis and AKI likely from UTI  -obs to progressive -given 30mg /kg IVF bolus and blood pressure normalized -continue IVF -pan cultured  -trend PCT  -trend lactic acid  -treated with aztreonam  for UTI due to allergies in ED, changing to meropenem  for broader UTI coverage with sepsis. Discussed with pharm

## 2024-07-18 NOTE — Assessment & Plan Note (Signed)
Continue crestor 20mg daily  

## 2024-07-18 NOTE — ED Triage Notes (Addendum)
 Pt is a Programmer, applications at hotel. Insulin  pump was detached from her when EMS arrived. Pt was found on the ground. Weak lethargic cool and clammy. A&Ox4. 62/30 BP Given NS. Pt co feeling Tired weak. CBG 260. Pt reports UTI that is currently being treated with antibiotics on day 4

## 2024-07-18 NOTE — Assessment & Plan Note (Signed)
 Continue ASA/statin

## 2024-07-18 NOTE — Assessment & Plan Note (Addendum)
 A1C of 14.5 from last year, repeat pending Insulin  pump taken off by EMS. Confirmed she does not have this on her  Discontinue pump while inpatient  Start sensitive SSI and accuchecks QAC/HS with AKI Start long acting insulin  at 4 units daily

## 2024-07-18 NOTE — Sepsis Progress Note (Signed)
 Elink following code sepsis

## 2024-07-18 NOTE — ED Provider Notes (Signed)
 [place] EMERGENCY DEPARTMENT AT [facility] Provider Note  CSN: [id number] Arrival date & time: 07/18/24 1245  Chief Complaint(s) Hypotension  HPI Stephanie Thornton is a 53 y.o. female history of diabetes, hypertension, hyperlipidemia, bipolar disorder presenting to the emergency department with apparent syncopal episode.  Patient reports she was found on the ground, does not remember.  Reports she has been feeling very weak lately.  She has been recently treated for UTI but is continuing to have urinary symptoms.  She reports some nausea and vomiting, chills, subjective fevers.  No chest pain or shortness of breath.  She reports some lower abdominal pain.  No flank or back pain.  No rash.  No headaches.  Paramedics found patient was hypotensive.   Past Medical History Past Medical History:  Diagnosis Date   Anxiety    Arthritis    knees, feet, back (05/24/2015)   Benign essential HTN 05/01/2015   Bipolar disorder (HCC) diagnosed early 90s   CAD in native artery    Chronic bronchitis (HCC)    get it q yr   Chronic lower back pain    Depression    Diabetes mellitus type II 2010   GERD (gastroesophageal reflux disease)    Headache    maybe 3 times/wk (05/24/2015)   History of hiatal hernia    History of stomach ulcers    HLD (hyperlipidemia)    Menopause    Migraine    1-2/wk (05/24/2015)   NSTEMI (non-ST elevated myocardial infarction) (HCC) 04/2022   OSA on CPAP    Stroke (HCC)    r sided weakness   Patient Active Problem List   Diagnosis Date Noted   Severe sepsis with acute organ dysfunction (HCC) 07/18/2024   Syncope 10/09/2023   Acute metabolic encephalopathy 09/07/2023   Medically noncompliant 09/07/2023   GERD (gastroesophageal reflux disease) 09/07/2023   Uncontrolled type 2 diabetes mellitus with hyperglycemia (HCC) 08/27/2022   Splenic lesion 08/27/2022   Diabetic ketoacidosis without coma associated with type 2 diabetes mellitus (HCC)  07/07/2022   Acute pancreatitis without infection or necrosis 07/07/2022   AKI (acute kidney injury) (HCC) 07/07/2022   Hypercalcemia 07/07/2022   Coronary artery disease 07/07/2022   Thyroid  nodule 07/07/2022   OSA (obstructive sleep apnea)    Reflux esophagitis    HLD (hyperlipidemia) 05/21/2022   NSTEMI (non-ST elevated myocardial infarction) (HCC) 05/07/2022   Essential hypertension    Trigger thumb of left hand 10/15/2017   Chronic pain disorder 06/24/2017   Lumbar degenerative disc disease 06/24/2017   Tobacco use disorder 06/24/2017   Ganglion cyst 03/04/2017   Acquired trigger finger 10/22/2016   Snapping hip, right 10/26/2015   Depression    Diabetes type 2, controlled (HCC)    Chronic tonsillitis 05/24/2015   Abnormal EKG 05/01/2015   Right-sided low back pain without sciatica 01/25/2015   Hx of peritonsillar abscess drainage 01/03/2015   Obesity (BMI 30-39.9) 10/09/2014   DM (diabetes mellitus) (HCC) 09/10/2013   Bipolar disorder (HCC)    Severe bipolar I disorder, current or most recent episode depressed (HCC) 11/26/2011   Home Medication(s) Prior to Admission medications   Medication Sig Start Date End Date Taking? Authorizing Provider  Acetaminophen  (TYLENOL  PO) Take 2 tablets by mouth 3 (three) times daily as needed (tooth ache).    [provider]  amitriptyline  (ELAVIL ) 150 MG tablet Take 150 mg by mouth at bedtime. 08/06/22   [provider]  amLODipine  (NORVASC ) 10 MG tablet Take 1 tablet (  10 mg total) by mouth daily. 09/23/22   Shlomo Wilbert SAUNDERS, MD  aspirin  81 MG chewable tablet Chew 1 tablet (81 mg total) by mouth daily. 05/09/22   Zhao, Xika, NP  atenolol  (TENORMIN ) 50 MG tablet Take 50 mg by mouth daily. 10/24/23   [provider]  baclofen  (LIORESAL ) 10 MG tablet Take 10 mg by mouth 3 (three) times daily. 05/05/22   [provider]  busPIRone  (BUSPAR ) 10 MG tablet Take 10 mg by mouth 3 (three) times daily. 01/25/19   [provider]  Continuous Glucose Sensor (DEXCOM G6 SENSOR) MISC SMARTSIG:1 Each Topical Every 10 Days 09/07/23   [provider]  Continuous Glucose Transmitter (DEXCOM G6 TRANSMITTER) MISC USE AS DIRECTED EVERY 90 DAYS 09/05/23   [provider]  gabapentin  (NEURONTIN ) 300 MG capsule Take 1 capsule (300 mg total) by mouth 3 (three) times daily. 11/11/22     HUMALOG 100 UNIT/ML injection 60 UNITS DAILY VIA INSULIN  PUMP INJECTION 30 DAYS    [provider]  Insulin  Disposable Pump (OMNIPOD 5 DEXG7G6 PODS GEN 5) MISC SMARTSIG:1 SUB-Q Every 3 Days 04/24/24   [provider]  Insulin  Pen Needle 31G X 5 MM MISC 1 Units by Does not apply route 4 (four) times daily. 09/07/23   Rojelio Nest, DO  LATUDA  80 MG TABS tablet Take 80 mg by mouth every evening. 02/01/19   [provider]  lidocaine  (LIDODERM ) 5 % Place 1 patch onto the skin daily. Remove & Discard patch within 12 hours or as directed by MD 12/31/23   Ruthe Cornet, DO  lisinopril -hydrochlorothiazide  (ZESTORETIC ) 20-12.5 MG tablet Take 1 tablet by mouth daily.    [provider]  mupirocin  ointment (BACTROBAN ) 2 % Apply 1 Application topically 2 (two) times daily. 05/14/24   Raspet, Erin K, PA-C  naproxen  (NAPROSYN ) 375 MG tablet Take 1 tablet (375 mg total) by mouth 2 (two) times daily. 05/14/24   Raspet, Erin K, PA-C  nitroGLYCERIN  (NITROSTAT ) 0.4 MG SL tablet Place 1 tablet (0.4 mg total) under the tongue every 5 (five) minutes x 3 doses as needed for chest pain. 05/09/22   Zhao, Xika, NP  Oxycodone  HCl 10 MG TABS Take 10 mg by mouth 4 (four) times daily as needed (pain). 07/08/22   [provider]  pantoprazole  (PROTONIX ) 40 MG tablet Take 40 mg by mouth daily. 02/19/24   [provider]  rosuvastatin  (CRESTOR ) 20 MG tablet Take 20 mg by mouth at bedtime. 07/29/23   [provider]  SYMBICORT 160-4.5 MCG/ACT inhaler SMARTSIG:2 inhalation Via Inhaler Twice Daily PRN 04/24/24    [provider]  topiramate  (TOPAMAX ) 200 MG tablet Take 200 mg by mouth at bedtime. 01/28/19   [provider]  traZODone  (DESYREL ) 100 MG tablet Take 200 mg by mouth at bedtime. 02/24/19   [provider]  Past Surgical History Past Surgical History:  Procedure Laterality Date   ABDOMINAL HYSTERECTOMY  2007   BUNIONECTOMY Bilateral    CARPAL TUNNEL RELEASE Right    CORONARY STENT INTERVENTION N/A 05/07/2022   Procedure: CORONARY STENT INTERVENTION;  Surgeon: Claudene Victory ORN, MD;  Location: [facility] INVASIVE CV LAB;  Service: Cardiovascular;  Laterality: N/A;   HAMMER TOE SURGERY Left    LEFT HEART CATH AND CORONARY ANGIOGRAPHY N/A 05/20/2018   Procedure: LEFT HEART CATH AND CORONARY ANGIOGRAPHY;  Surgeon: Anner Alm ORN, MD;  Location: [facility] INVASIVE CV LAB;  Service: Cardiovascular;  Laterality: N/A;   LEFT HEART CATH AND CORONARY ANGIOGRAPHY N/A 05/07/2022   Procedure: LEFT HEART CATH AND CORONARY ANGIOGRAPHY;  Surgeon: Claudene Victory ORN, MD;  Location: [facility] INVASIVE CV LAB;  Service: Cardiovascular;  Laterality: N/A;   TONSILLECTOMY Bilateral 05/24/2015   Procedure: TONSILLECTOMY;  Surgeon: Vaughan Ricker, MD;  Location: [facility] OR;  Service: ENT;  Laterality: Bilateral;   Family History Family History  Problem Relation Age of Onset   Bipolar disorder Mother    CVA Mother    Bipolar disorder Son    Depression Brother        schizoaffective d/o   Hypertension Sister    Heart disease Maternal Aunt    Heart attack Maternal Aunt    Hypertension Brother     Social History Social History   Tobacco Use   Smoking status: Former    Current packs/day: 0.00    Average packs/day: 1 pack/day for 30.0 years (30.0 ttl pk-yrs)    Types: Cigarettes    Start date: 09/22/1992    Quit date: 09/22/2022    Years since quitting: 1.8   Smokeless tobacco: Never   Vaping Use   Vaping status: Never Used  Substance Use Topics   Alcohol use: No   Drug use: No   Allergies Lyrica [pregabalin], Rocephin  [ceftriaxone  sodium in dextrose ], Sulfa antibiotics, Ultram [tramadol], Vancomycin, Glucotrol  [glipizide ], Keflex  [cephalexin ], Penicillins, and Janumet  [sitagliptin  phos-metformin  hcl]  Review of Systems Review of Systems  All other systems reviewed and are negative.   Physical Exam Vital Signs  I have reviewed the triage vital signs BP 112/78 (BP Location: Left Arm)   Pulse 83   Temp 97.8 F (36.6 C) (Oral)   Resp 20   Ht 5' 2 (1.575 m)   Wt 81.6 kg   SpO2 94%   BMI 32.90 kg/m  Physical Exam Vitals and nursing note reviewed.  Constitutional:      General: She is not in acute distress.    Appearance: She is well-developed. She is not ill-appearing.  HENT:     Head: Normocephalic and atraumatic.     Mouth/Throat:     Mouth: Mucous membranes are dry.  Eyes:     Pupils: Pupils are equal, round, and reactive to light.  Cardiovascular:     Rate and Rhythm: Normal rate and regular rhythm.     Heart sounds: No murmur heard. Pulmonary:     Effort: Pulmonary effort is normal. No respiratory distress.     Breath sounds: Normal breath sounds.  Abdominal:     General: Abdomen is flat.     Palpations: Abdomen is soft.     Tenderness: There is abdominal tenderness (suprapubic).  Musculoskeletal:        General: No tenderness.     Right lower leg: No edema.     Left lower leg: No edema.  Skin:    General: Skin is warm and  dry.  Neurological:     General: No focal deficit present.     Mental Status: She is alert. Mental status is at baseline.  Psychiatric:        Mood and Affect: Mood normal.        Behavior: Behavior normal.     ED Results and Treatments Labs (all labs ordered are listed, but only abnormal results are displayed) Labs Reviewed  COMPREHENSIVE METABOLIC PANEL WITH GFR - Abnormal; Notable for the following  components:      Result Value   CO2 20 (*)    Glucose, Bld 281 (*)    BUN 29 (*)    Creatinine, Ser 2.90 (*)    Calcium  8.7 (*)    GFR, Estimated 19 (*)    All other components within normal limits  CBC WITH DIFFERENTIAL/PLATELET - Abnormal; Notable for the following components:   RDW 15.8 (*)    All other components within normal limits  URINALYSIS, W/ REFLEX TO CULTURE (INFECTION SUSPECTED) - Abnormal; Notable for the following components:   Color, Urine AMBER (*)    APPearance TURBID (*)    Bilirubin Urine SMALL (*)    Protein, ur 100 (*)    Leukocytes,Ua MODERATE (*)    Bacteria, UA MANY (*)    All other components within normal limits  CBG MONITORING, ED - Abnormal; Notable for the following components:   Glucose-Capillary 244 (*)    All other components within normal limits  I-STAT CHEM 8, ED - Abnormal; Notable for the following components:   BUN 30 (*)    Creatinine, Ser 3.10 (*)    Glucose, Bld 275 (*)    Calcium , Ion 1.12 (*)    TCO2 18 (*)    All other components within normal limits  I-STAT CG4 LACTIC ACID, ED - Abnormal; Notable for the following components:   Lactic Acid, Venous 2.4 (*)    All other components within normal limits  I-STAT CG4 LACTIC ACID, ED - Abnormal; Notable for the following components:   Lactic Acid, Venous 2.0 (*)    All other components within normal limits  CULTURE, BLOOD (ROUTINE X 2)  CULTURE, BLOOD (ROUTINE X 2)  LIPASE, BLOOD  SODIUM, URINE, RANDOM  CREATININE, URINE, RANDOM  HEMOGLOBIN A1C                                                                                                                          Radiology CT Renal Stone Study Result Date: 07/18/2024 CLINICAL DATA:  Provided history: Abdominal/flank pain, stone suspected Technologist notes state patient found on ground, weak and lethargic. Patient reports urinary tract infection, currently treated with antibiotics. EXAM: CT ABDOMEN AND PELVIS WITHOUT CONTRAST  TECHNIQUE: Multidetector CT imaging of the abdomen and pelvis was performed following the standard protocol without IV contrast. RADIATION DOSE REDUCTION: This exam was performed according to the departmental dose-optimization program which includes automated exposure control, adjustment of the mA and/or kV according to patient  size and/or use of iterative reconstruction technique. COMPARISON:  CT 08/07/2022 FINDINGS: Lower chest: Hypoventilatory changes in the dependent lung bases. No pleural effusion. Hepatobiliary: Unremarkable unenhanced appearance of the liver. Gallbladder physiologically distended, no calcified stone. No biliary dilatation. Pancreas: No ductal dilatation or inflammation. Spleen: Normal in size without focal abnormality. The low-density splenic lesion on prior exam is not seen in the absence of IV contrast. Adrenals/Urinary Tract: No adrenal nodule. 5 mm stone in the upper left kidney. Punctate calcifications of both renal hila may represent nonobstructing stones or vascular calcifications. No hydronephrosis. No evidence of renal inflammation. The urinary bladder is minimally distended. Minimal bladder wall thickening. No air in the bladder. Stomach/Bowel: No bowel obstruction or inflammation. Stomach is nondistended. The appendix is normal. Small to moderate colonic stool burden with mild colonic redundancy. Vascular/Lymphatic: Aortic and branch atherosclerosis. No aortic aneurysm. Scattered small central mesenteric lymph nodes are not enlarged by size criteria. No suspicious lymphadenopathy. Reproductive: Status post hysterectomy. No adnexal masses. Other: No free air, free fluid, or intra-abdominal fluid collection. Mild rectus diastasis with small fat containing umbilical hernia. Musculoskeletal: Scattered bone islands in the pelvis. There are no acute or suspicious osseous abnormalities. IMPRESSION: 1. Minimal bladder wall thickening, can be seen with cystitis. 2. Nonobstructing left renal  stone. Punctate calcifications of both renal hila may represent nonobstructing stones or vascular calcifications. No hydronephrosis. Aortic Atherosclerosis (ICD10-I70.0). Electronically Signed   By: Andrea Gasman M.D.   On: 07/18/2024 15:07   DG Chest Port 1 View Result Date: 07/18/2024 CLINICAL DATA:  sepsis EXAM: PORTABLE CHEST 1 VIEW COMPARISON:  Chest x-ray 09/05/2023 FINDINGS: Prominent cardiac silhouette likely due to AP portable technique. The heart and mediastinal contours are unchanged. Atherosclerotic plaque. No focal consolidation. No pulmonary edema. No pleural effusion. No pneumothorax. No acute osseous abnormality. IMPRESSION: 1. No active disease. 2.  Aortic Atherosclerosis (ICD10-I70.0). Electronically Signed   By: Morgane  Naveau M.D.   On: 07/18/2024 13:51    Pertinent labs & imaging results that were available during my care of the patient were reviewed by me and considered in my medical decision making (see MDM for details).  Medications Ordered in ED Medications  lactated ringers  infusion ( Intravenous New Bag/Given 07/18/24 1541)  insulin  aspart (novoLOG ) injection 0-9 Units (has no administration in time range)  lactated ringers  bolus 2,448 mL (0 mLs Intravenous Stopped 07/18/24 1538)  aztreonam  (AZACTAM ) 2 g in sodium chloride  0.9 % 100 mL IVPB (0 g Intravenous Stopped 07/18/24 1538)                                                                                                                                     Procedures .Critical Care  Performed by: Francesca Elsie CROME, MD Authorized by: Francesca Elsie CROME, MD   Critical care provider statement:    Critical care time (minutes):  35   Critical care was necessary to treat or prevent  imminent or life-threatening deterioration of the following conditions:  Sepsis and dehydration   Critical care was time spent personally by me on the following activities:  Development of treatment plan with patient or surrogate,  discussions with consultants, evaluation of patient's response to treatment, examination of patient, ordering and review of laboratory studies, ordering and review of radiographic studies, ordering and performing treatments and interventions, pulse oximetry, re-evaluation of patient's condition and review of old charts   Care discussed with: admitting provider     (including critical care time)  Medical Decision Making / ED Course   MDM:  53 year old presenting to the emergency department with found down, low blood pressure.  Patient overall well-appearing but does have low blood pressure.  Concern for possible sepsis.  Patient reports recent UTI symptoms however it appears patient was prescribed Macrobid , labs so far show significant AKI with creatinine of 3 so Macrobid  would be unlikely to work.  She is allergic to penicillin so we will give aztreonam .  Lower concern for other causes of symptoms but differentials include significant dehydration given significant AKI, will give sepsis bolus of fluids.  Consider other causes of sepsis such as meningitis or CNS infection, intra-abdominal infection, pneumonia, cellulitis, lower concern for these.  Given sepsis and UTI will obtain CT abdomen pelvis to evaluate for kidney stone or other associated issue.  Will reassess.  Anticipate patient will need admission.  Clinical Course as of 07/18/24 1614  Sun Jul 18, 2024  1612 Workup shows AKI, lactic acid improved after fluids.  Urinalysis consistent with UTI.  Chest x-ray without pneumonia.  CT abdomen shows signs of cystitis without other dangerous process.  Patient will be admitted for further management of sepsis/dehydration/UTI. [WS]    Clinical Course User Index [WS] Francesca Elsie CROME, MD     Additional history obtained: -Additional history obtained from ems -External records from outside source obtained and reviewed including: Chart review including previous notes, labs, imaging,  consultation notes including prior notes   Lab Tests: -I ordered, reviewed, and interpreted labs.   The pertinent results include:   Labs Reviewed  COMPREHENSIVE METABOLIC PANEL WITH GFR - Abnormal; Notable for the following components:      Result Value   CO2 20 (*)    Glucose, Bld 281 (*)    BUN 29 (*)    Creatinine, Ser 2.90 (*)    Calcium  8.7 (*)    GFR, Estimated 19 (*)    All other components within normal limits  CBC WITH DIFFERENTIAL/PLATELET - Abnormal; Notable for the following components:   RDW 15.8 (*)    All other components within normal limits  URINALYSIS, W/ REFLEX TO CULTURE (INFECTION SUSPECTED) - Abnormal; Notable for the following components:   Color, Urine AMBER (*)    APPearance TURBID (*)    Bilirubin Urine SMALL (*)    Protein, ur 100 (*)    Leukocytes,Ua MODERATE (*)    Bacteria, UA MANY (*)    All other components within normal limits  CBG MONITORING, ED - Abnormal; Notable for the following components:   Glucose-Capillary 244 (*)    All other components within normal limits  I-STAT CHEM 8, ED - Abnormal; Notable for the following components:   BUN 30 (*)    Creatinine, Ser 3.10 (*)    Glucose, Bld 275 (*)    Calcium , Ion 1.12 (*)    TCO2 18 (*)    All other components within normal limits  I-STAT CG4 LACTIC ACID, ED -  Abnormal; Notable for the following components:   Lactic Acid, Venous 2.4 (*)    All other components within normal limits  I-STAT CG4 LACTIC ACID, ED - Abnormal; Notable for the following components:   Lactic Acid, Venous 2.0 (*)    All other components within normal limits  CULTURE, BLOOD (ROUTINE X 2)  CULTURE, BLOOD (ROUTINE X 2)  LIPASE, BLOOD  SODIUM, URINE, RANDOM  CREATININE, URINE, RANDOM  HEMOGLOBIN A1C    Notable for see MDM  EKG   EKG Interpretation Date/Time:  Sunday July 18 2024 12:54:04 EDT Ventricular Rate:  82 PR Interval:  170 QRS Duration:  88 QT Interval:  415 QTC Calculation: 485 R  Axis:   -31  Text Interpretation: Sinus rhythm LAE, consider biatrial enlargement Left axis deviation Low voltage, precordial leads Borderline T wave abnormalities Confirmed by Francesca Fallow ([id number]) on 07/18/2024 1:06:02 PM         Imaging Studies ordered: I ordered imaging studies including CXR, CT abd On my interpretation imaging demonstrates cystitis  I independently visualized and interpreted imaging. I agree with the radiologist interpretation   Medicines ordered and prescription drug management: Meds ordered this encounter  Medications   lactated ringers  bolus 2,448 mL   lactated ringers  infusion   aztreonam  (AZACTAM ) 2 g in sodium chloride  0.9 % 100 mL IVPB    Antibiotic Indication::   UTI   insulin  aspart (novoLOG ) injection 0-9 Units    Correction coverage::   Sensitive (thin, NPO, renal)    CBG < 70::   Implement Hypoglycemia Standing Orders and refer to Hypoglycemia Standing Orders sidebar report    CBG 70 - 120::   0 units    CBG 121 - 150::   1 unit    CBG 151 - 200::   2 units    CBG 201 - 250::   3 units    CBG 251 - 300::   5 units    CBG 301 - 350::   7 units    CBG 351 - 400:   9 units    CBG > 400:   call MD and obtain STAT lab verification    -I have reviewed the patients home medicines and have made adjustments as needed   Consultations Obtained: I requested consultation with the hospitalist,  and discussed lab and imaging findings as well as pertinent plan - they recommend: aadmission   Cardiac Monitoring: The patient was maintained on a cardiac monitor.  I personally viewed and interpreted the cardiac monitored which showed an underlying rhythm of: NSR  Reevaluation: After the interventions noted above, I reevaluated the patient and found that their symptoms have improved  Co morbidities that complicate the patient evaluation  Past Medical History:  Diagnosis Date   Anxiety    Arthritis    knees, feet, back (05/24/2015)   Benign  essential HTN 05/01/2015   Bipolar disorder (HCC) diagnosed early 90s   CAD in native artery    Chronic bronchitis (HCC)    get it q yr   Chronic lower back pain    Depression    Diabetes mellitus type II 2010   GERD (gastroesophageal reflux disease)    Headache    maybe 3 times/wk (05/24/2015)   History of hiatal hernia    History of stomach ulcers    HLD (hyperlipidemia)    Menopause    Migraine    1-2/wk (05/24/2015)   NSTEMI (non-ST elevated myocardial infarction) (HCC) 04/2022   OSA on CPAP  Stroke [facility])    r sided weakness      Dispostion: Disposition decision including need for hospitalization was considered, and patient admitted to the hospital.    Final Clinical Impression(s) / ED Diagnoses Final diagnoses:  Sepsis due to urinary tract infection (HCC)  Dehydration  AKI (acute kidney injury) (HCC)     This chart was dictated using voice recognition software.  Despite best efforts to proofread,  errors can occur which can change the documentation meaning.    Francesca Elsie CROME, MD 07/18/24 [phone]

## 2024-07-18 NOTE — Assessment & Plan Note (Addendum)
 Creatinine baseline appears normal from 6 months ago (.8-.9) Presenting with creatinine of 2.90 UA significant for infection  CT renal stone with no post renal obstruction  Likely combination of pre renal and intra renal from exogenous losses from vomiting and poor PO intake +hypotension and medications.  Continue IVF Strict I/O Maintain MAP >65 Check urine studies  Avoid nephrotoxic drugs Trend

## 2024-07-19 DIAGNOSIS — I252 Old myocardial infarction: Secondary | ICD-10-CM | POA: Diagnosis not present

## 2024-07-19 DIAGNOSIS — Z7982 Long term (current) use of aspirin: Secondary | ICD-10-CM | POA: Diagnosis not present

## 2024-07-19 DIAGNOSIS — A419 Sepsis, unspecified organism: Secondary | ICD-10-CM | POA: Diagnosis present

## 2024-07-19 DIAGNOSIS — Z8249 Family history of ischemic heart disease and other diseases of the circulatory system: Secondary | ICD-10-CM | POA: Diagnosis not present

## 2024-07-19 DIAGNOSIS — G4733 Obstructive sleep apnea (adult) (pediatric): Secondary | ICD-10-CM | POA: Diagnosis present

## 2024-07-19 DIAGNOSIS — I2510 Atherosclerotic heart disease of native coronary artery without angina pectoris: Secondary | ICD-10-CM | POA: Diagnosis present

## 2024-07-19 DIAGNOSIS — N3090 Cystitis, unspecified without hematuria: Secondary | ICD-10-CM | POA: Diagnosis present

## 2024-07-19 DIAGNOSIS — F319 Bipolar disorder, unspecified: Secondary | ICD-10-CM | POA: Diagnosis present

## 2024-07-19 DIAGNOSIS — K5909 Other constipation: Secondary | ICD-10-CM | POA: Diagnosis present

## 2024-07-19 DIAGNOSIS — I10 Essential (primary) hypertension: Secondary | ICD-10-CM | POA: Diagnosis present

## 2024-07-19 DIAGNOSIS — Z880 Allergy status to penicillin: Secondary | ICD-10-CM | POA: Diagnosis not present

## 2024-07-19 DIAGNOSIS — E1165 Type 2 diabetes mellitus with hyperglycemia: Secondary | ICD-10-CM | POA: Diagnosis present

## 2024-07-19 DIAGNOSIS — Z6832 Body mass index (BMI) 32.0-32.9, adult: Secondary | ICD-10-CM | POA: Diagnosis not present

## 2024-07-19 DIAGNOSIS — E860 Dehydration: Secondary | ICD-10-CM | POA: Diagnosis present

## 2024-07-19 DIAGNOSIS — N179 Acute kidney failure, unspecified: Secondary | ICD-10-CM | POA: Diagnosis present

## 2024-07-19 DIAGNOSIS — E872 Acidosis, unspecified: Secondary | ICD-10-CM | POA: Diagnosis present

## 2024-07-19 DIAGNOSIS — I951 Orthostatic hypotension: Secondary | ICD-10-CM | POA: Diagnosis present

## 2024-07-19 DIAGNOSIS — Z87891 Personal history of nicotine dependence: Secondary | ICD-10-CM | POA: Diagnosis not present

## 2024-07-19 DIAGNOSIS — N200 Calculus of kidney: Secondary | ICD-10-CM | POA: Diagnosis not present

## 2024-07-19 DIAGNOSIS — Z66 Do not resuscitate: Secondary | ICD-10-CM | POA: Diagnosis present

## 2024-07-19 DIAGNOSIS — K219 Gastro-esophageal reflux disease without esophagitis: Secondary | ICD-10-CM | POA: Diagnosis present

## 2024-07-19 DIAGNOSIS — J42 Unspecified chronic bronchitis: Secondary | ICD-10-CM | POA: Diagnosis present

## 2024-07-19 DIAGNOSIS — Z794 Long term (current) use of insulin: Secondary | ICD-10-CM | POA: Diagnosis not present

## 2024-07-19 DIAGNOSIS — E876 Hypokalemia: Secondary | ICD-10-CM | POA: Diagnosis not present

## 2024-07-19 DIAGNOSIS — Z7951 Long term (current) use of inhaled steroids: Secondary | ICD-10-CM | POA: Diagnosis not present

## 2024-07-19 DIAGNOSIS — R6520 Severe sepsis without septic shock: Secondary | ICD-10-CM | POA: Diagnosis not present

## 2024-07-19 DIAGNOSIS — E785 Hyperlipidemia, unspecified: Secondary | ICD-10-CM | POA: Diagnosis present

## 2024-07-19 LAB — CBC
HCT: 37.8 % (ref 36.0–46.0)
Hemoglobin: 12.2 g/dL (ref 12.0–15.0)
MCH: 26.9 pg (ref 26.0–34.0)
MCHC: 32.3 g/dL (ref 30.0–36.0)
MCV: 83.4 fL (ref 80.0–100.0)
Platelets: 202 K/uL (ref 150–400)
RBC: 4.53 MIL/uL (ref 3.87–5.11)
RDW: 15.7 % — ABNORMAL HIGH (ref 11.5–15.5)
WBC: 7.5 K/uL (ref 4.0–10.5)
nRBC: 0 % (ref 0.0–0.2)

## 2024-07-19 LAB — RAPID URINE DRUG SCREEN, HOSP PERFORMED
Amphetamines: NOT DETECTED
Barbiturates: NOT DETECTED
Benzodiazepines: NOT DETECTED
Cocaine: NOT DETECTED
Opiates: NOT DETECTED
Tetrahydrocannabinol: NOT DETECTED

## 2024-07-19 LAB — GLUCOSE, CAPILLARY
Glucose-Capillary: 221 mg/dL — ABNORMAL HIGH (ref 70–99)
Glucose-Capillary: 196 mg/dL — ABNORMAL HIGH (ref 70–99)
Glucose-Capillary: 230 mg/dL — ABNORMAL HIGH (ref 70–99)
Glucose-Capillary: 250 mg/dL — ABNORMAL HIGH (ref 70–99)

## 2024-07-19 LAB — BASIC METABOLIC PANEL WITH GFR
Anion gap: 8 (ref 5–15)
BUN: 27 mg/dL — ABNORMAL HIGH (ref 6–20)
CO2: 21 mmol/L — ABNORMAL LOW (ref 22–32)
Calcium: 8.5 mg/dL — ABNORMAL LOW (ref 8.9–10.3)
Chloride: 106 mmol/L (ref 98–111)
Creatinine, Ser: 1.32 mg/dL — ABNORMAL HIGH (ref 0.44–1.00)
GFR, Estimated: 49 mL/min — ABNORMAL LOW (ref >60)
Glucose, Bld: 300 mg/dL — ABNORMAL HIGH (ref 70–99)
Potassium: 3.2 mmol/L — ABNORMAL LOW (ref 3.5–5.1)
Sodium: 135 mmol/L (ref 135–145)

## 2024-07-19 LAB — PROCALCITONIN: Procalcitonin: <0.1 ng/mL

## 2024-07-19 MED ORDER — FLUTICASONE FUROATE-VILANTEROL 200-25 MCG/ACT IN AEPB
1.0000 | INHALATION_SPRAY | Freq: Every day | RESPIRATORY_TRACT | Status: DC
  Administered 2024-07-19 – 2024-07-21 (×3): 1 via RESPIRATORY_TRACT
  Filled 2024-07-19: qty 28

## 2024-07-19 MED ORDER — BISACODYL 10 MG RE SUPP: 10.0000 mg | Freq: Every day | RECTAL | Status: DC | PRN

## 2024-07-19 MED ORDER — POLYETHYLENE GLYCOL 3350 17 G PO PACK
17.0000 g | PACK | Freq: Every day | ORAL | Status: DC
  Administered 2024-07-19 – 2024-07-20 (×2): 17 g via ORAL
  Filled 2024-07-19 (×3): qty 1

## 2024-07-19 MED ORDER — ESCITALOPRAM OXALATE 10 MG PO TABS
5.0000 mg | ORAL_TABLET | Freq: Every morning | ORAL | Status: DC
  Administered 2024-07-19 – 2024-07-21 (×3): 5 mg via ORAL
  Filled 2024-07-19 (×3): qty 1

## 2024-07-19 MED ORDER — FLEET ENEMA RE ENEM
1.0000 | ENEMA | Freq: Once | RECTAL | Status: AC
  Administered 2024-07-19: 1 via RECTAL
  Filled 2024-07-19: qty 1

## 2024-07-19 MED ORDER — ENOXAPARIN SODIUM 40 MG/0.4ML IJ SOSY
40.0000 mg | PREFILLED_SYRINGE | Freq: Every day | INTRAMUSCULAR | Status: DC
  Administered 2024-07-19 – 2024-07-20 (×2): 40 mg via SUBCUTANEOUS
  Filled 2024-07-19 (×2): qty 0.4

## 2024-07-19 MED ORDER — SENNA 8.6 MG PO TABS
1.0000 | ORAL_TABLET | Freq: Every day | ORAL | Status: DC
  Administered 2024-07-19 – 2024-07-20 (×2): 8.6 mg via ORAL
  Filled 2024-07-19 (×2): qty 1

## 2024-07-19 MED ORDER — FOSFOMYCIN TROMETHAMINE 3 G PO PACK
3.0000 g | PACK | Freq: Once | ORAL | Status: AC
  Administered 2024-07-19: 3 g via ORAL
  Filled 2024-07-19: qty 3

## 2024-07-19 MED ORDER — HYDROXYZINE HCL 25 MG PO TABS: 25.0000 mg | ORAL_TABLET | Freq: Three times a day (TID) | ORAL | Status: DC | PRN

## 2024-07-19 MED ORDER — TOPIRAMATE 100 MG PO TABS
200.0000 mg | ORAL_TABLET | Freq: Every day | ORAL | Status: DC
  Administered 2024-07-19 – 2024-07-20 (×2): 200 mg via ORAL
  Filled 2024-07-19 (×2): qty 2

## 2024-07-19 NOTE — Progress Notes (Incomplete Revision)
 [facility] Progress Note  Patient: Stephanie Thornton     FMW:[id number]  DOA: 07/18/2024   PCP: Austin Mutton, MD       Brief hospital course: This is a 53 year old female with gastroesophageal reflux disease, CVA, coronary artery disease, hypertension and type 2 diabetes mellitus who uses an insulin  pump.  She presents for nausea, vomiting, poor oral intake and syncope.  Apparently she was unresponsive for about 3 minutes at home. In the ED: Noted to have a UA consistent with a UTI and a lactic acid of 2.4.  Creatinine was elevated at 2.90.  Subjective:  Oral intake remains poor.  She states she has not had a bowel movement in 5 days.   Assessment and Plan: Principal Problem:   Severe sepsis   - Likely secondary to a UTI - The patient has grown E faecalis in the past - receiving meropenem    Active Problems:   Syncope-orthostatic hypotension with a history of hypertension. -  syncope likely secondary to orthostatic hypotension- she was noted to be orthostatic on admission-treated with IV fluids - Orthostatic vitals checked this morning and not consistent with orthostatic hypotension - Continues to be mildly hypotensive - Losartan /HCTZ and Atenolol  being held    AKI (acute kidney injury) - Secondary to dehydration-  Hypokalemia - replacing  Chronic constipation - In the setting of chronic narcotic use - Fleet enema ordered along with Miralax  and Senna    Uncontrolled type 2 diabetes mellitus with hyperglycemia - A1c 9.9 - The patient uses an insulin  pump as outpatient-currently does not have her pump with her but could certainly resume her pump if someone was to bring it back to the hospital - Currently on Semglee  and low-dose sliding scale - Continue to follow glucose    Bipolar disorder (HCC) - Continue Latuda     History of stroke - Continue aspirin     OSA (obstructive sleep apnea)  Chronic pain - Continue oxycodone  which the patient takes at home       Code Status: Limited: Do not attempt resuscitation (DNR) -DNR-LIMITED -Do Not Intubate/DNI  Total time on patient care: 35 minutes DVT prophylaxis: Lovenox     Objective:   Vitals:   07/18/24 2250 07/19/24 0106 07/19/24 0505 07/19/24 1101  BP:   116/71 113/67  Pulse:   83 79  Resp: 16 11 18 19   Temp:   98.3 F (36.8 C) 99 F (37.2 C)  TempSrc:   Oral Oral  SpO2: 92%  94% 97%  Weight:      Height:       Filed Weights   07/18/24 1252  Weight: 81.6 kg   Exam: General exam: Appears comfortable  HEENT: oral mucosa moist Respiratory system: Clear to auscultation.  Cardiovascular system: S1 & S2 heard  Gastrointestinal system: Abdomen soft, non-tender, nondistended. Normal bowel sounds   Extremities: No cyanosis, clubbing or edema Psychiatry:  Mood & affect appropriate.      CBC: Recent Labs  Lab 07/18/24 1314 07/18/24 1322 07/19/24 0504  WBC 9.7  --  7.5  NEUTROABS 6.4  --   --   HGB 13.6 14.6 12.2  HCT 42.3 43.0 37.8  MCV 83.8  --  83.4  PLT 206  --  202   Basic Metabolic Panel: Recent Labs  Lab 07/18/24 1314 07/18/24 1322 07/19/24 0504  NA 137 136 135  K 3.7 3.5 3.2*  CL 106 105 106  CO2 20*  --  21*  GLUCOSE 281* 275* 300*  BUN  29* 30* 27*  CREATININE 2.90* 3.10* 1.32*  CALCIUM  8.7*  --  8.5*     Scheduled Meds:  amitriptyline   150 mg Oral QHS   aspirin   81 mg Oral Daily   busPIRone   15 mg Oral TID   enoxaparin  (LOVENOX ) injection  40 mg Subcutaneous QHS   escitalopram   5 mg Oral q morning   gabapentin   300 mg Oral TID   insulin  aspart  0-9 Units Subcutaneous TID WC   insulin  glargine-yfgn  4 Units Subcutaneous Daily   lurasidone   80 mg Oral QPM   pantoprazole   40 mg Oral Daily   rosuvastatin   20 mg Oral QHS   topiramate   200 mg Oral QHS   traZODone   200 mg Oral QHS    Imaging and lab data personally reviewed   Author: Marik Sedore  07/19/2024 11:06 AM  To contact [facility]>   Check the care team in [facility] and look for the  attending/consulting [facility] provider listed  Log into [email or web address] and use [place]'s universal password   Go to> [facility]  and find provider  If you still have difficulty reaching the provider, please page the [facility] (Director on Call) for the Hospitalists listed on amion

## 2024-07-19 NOTE — Progress Notes (Signed)
   07/19/24 0920  TOC Brief Assessment  Insurance and Status Reviewed  Patient has primary care physician Yes  Home environment has been reviewed single family home  Prior level of function: independent  Prior/Current Home Services No current home services  Social Drivers of Health Review SDOH reviewed no interventions necessary  Readmission risk has been reviewed Yes  Transition of care needs no transition of care needs at this time    Heather Saltness, MSW, LCSW 07/19/2024 9:21 AM

## 2024-07-19 NOTE — Progress Notes (Addendum)
 [facility] Progress Note  Patient: Stephanie Thornton     FMW:[id number]  DOA: 07/18/2024   PCP: Austin Mutton, MD       Brief hospital course: This is a 53 year old female with gastroesophageal reflux disease, CVA, coronary artery disease, hypertension and type 2 diabetes mellitus who uses an insulin  pump.  She presents for nausea, vomiting, poor oral intake and syncope.  Apparently she was unresponsive for about 3 minutes at home. In the ED: Noted to have a UA consistent with a UTI and a lactic acid of 2.4.  Creatinine was elevated at 2.90.  Subjective:  Severe constipation and nausea  Assessment and Plan: Principal Problem:   Severe sepsis   - Likely secondary to a UTI - The patient has grown E faecalis in the past - receiving Meropenem -  will need to f/u urine culture   Active Problems:   Syncope-orthostatic hypotension with a history of hypertension. -  syncope likely secondary to orthostatic hypotension- she was noted to be orthostatic on admission-treated with IV fluids - Orthostatic vitals checked this morning and not consistent with orthostatic hypotension - Continues to be mildly hypotensive - Losartan /HCTZ and Atenolol  being held    AKI (acute kidney injury) - Secondary to dehydration- has improved  Hypokalemia - replacing  Chronic constipation - In the setting of chronic narcotic use - Fleet enema ordered along with Miralax  and Senna    Uncontrolled type 2 diabetes mellitus with hyperglycemia - A1c 9.9 - The patient uses an insulin  pump as outpatient-currently does not have her pump with her but could certainly resume her pump if someone was to bring it back to the hospital    Bipolar disorder (HCC) - Continue Latuda     History of stroke - Continue aspirin     OSA (obstructive sleep apnea) - cont CPAP at night  Chronic pain - Continue oxycodone  which the patient takes at home   Class 1 obesity Body mass index is 32.9 kg/m.     Code Status:  Limited: Do not attempt resuscitation (DNR) -DNR-LIMITED -Do Not Intubate/DNI  Total time on patient care: 35 minutes DVT prophylaxis: Lovenox     Objective:   Vitals:   07/18/24 2250 07/19/24 0106 07/19/24 0505 07/19/24 1101  BP:   116/71 113/67  Pulse:   83 79  Resp: 16 11 18 19   Temp:   98.3 F (36.8 C) 99 F (37.2 C)  TempSrc:   Oral Oral  SpO2: 92%  94% 97%  Weight:      Height:       Filed Weights   07/18/24 1252  Weight: 81.6 kg   Exam: General exam: Appears comfortable  HEENT: oral mucosa moist Respiratory system: Clear to auscultation.  Cardiovascular system: S1 & S2 heard  Gastrointestinal system: Abdomen soft, non-tender, nondistended. Normal bowel sounds   Extremities: No cyanosis, clubbing or edema Psychiatry:  Mood & affect appropriate.    CBC: Recent Labs  Lab 07/18/24 1314 07/18/24 1322 07/19/24 0504  WBC 9.7  --  7.5  NEUTROABS 6.4  --   --   HGB 13.6 14.6 12.2  HCT 42.3 43.0 37.8  MCV 83.8  --  83.4  PLT 206  --  202   Basic Metabolic Panel: Recent Labs  Lab 07/18/24 1314 07/18/24 1322 07/19/24 0504  NA 137 136 135  K 3.7 3.5 3.2*  CL 106 105 106  CO2 20*  --  21*  GLUCOSE 281* 275* 300*  BUN 29* 30* 27*  CREATININE  2.90* 3.10* 1.32*  CALCIUM  8.7*  --  8.5*     Scheduled Meds:  amitriptyline   150 mg Oral QHS   aspirin   81 mg Oral Daily   busPIRone   15 mg Oral TID   enoxaparin  (LOVENOX ) injection  40 mg Subcutaneous QHS   escitalopram   5 mg Oral q morning   gabapentin   300 mg Oral TID   insulin  aspart  0-9 Units Subcutaneous TID WC   insulin  glargine-yfgn  4 Units Subcutaneous Daily   lurasidone   80 mg Oral QPM   pantoprazole   40 mg Oral Daily   rosuvastatin   20 mg Oral QHS   topiramate   200 mg Oral QHS   traZODone   200 mg Oral QHS    Imaging and lab data personally reviewed   Author: Cleveland Paiz  07/19/2024 11:06 AM  To contact [facility]>   Check the care team in [facility] and look for the attending/consulting  [facility] provider listed  Log into [email or web address] and use [place]'s universal password   Go to> [facility]  and find provider  If you still have difficulty reaching the provider, please page the [facility] (Director on Call) for the Hospitalists listed on amion

## 2024-07-20 DIAGNOSIS — A419 Sepsis, unspecified organism: Secondary | ICD-10-CM | POA: Diagnosis not present

## 2024-07-20 DIAGNOSIS — R6520 Severe sepsis without septic shock: Secondary | ICD-10-CM | POA: Diagnosis not present

## 2024-07-20 DIAGNOSIS — Z880 Allergy status to penicillin: Secondary | ICD-10-CM

## 2024-07-20 DIAGNOSIS — N200 Calculus of kidney: Secondary | ICD-10-CM

## 2024-07-20 LAB — BASIC METABOLIC PANEL WITH GFR
Anion gap: 8 (ref 5–15)
BUN: 19 mg/dL (ref 6–20)
CO2: 24 mmol/L (ref 22–32)
Calcium: 8.6 mg/dL — ABNORMAL LOW (ref 8.9–10.3)
Chloride: 105 mmol/L (ref 98–111)
Creatinine, Ser: 0.78 mg/dL (ref 0.44–1.00)
GFR, Estimated: >60 mL/min (ref >60)
Glucose, Bld: 247 mg/dL — ABNORMAL HIGH (ref 70–99)
Potassium: 3.5 mmol/L (ref 3.5–5.1)
Sodium: 137 mmol/L (ref 135–145)

## 2024-07-20 LAB — GLUCOSE, CAPILLARY
Glucose-Capillary: 306 mg/dL — ABNORMAL HIGH (ref 70–99)
Glucose-Capillary: 154 mg/dL — ABNORMAL HIGH (ref 70–99)
Glucose-Capillary: 199 mg/dL — ABNORMAL HIGH (ref 70–99)
Glucose-Capillary: 220 mg/dL — ABNORMAL HIGH (ref 70–99)
Glucose-Capillary: 277 mg/dL — ABNORMAL HIGH (ref 70–99)

## 2024-07-20 MED ORDER — CIPROFLOXACIN 500 MG/5ML (10%) PO SUSR: 500.0000 mg | Freq: Two times a day (BID) | ORAL | Status: DC

## 2024-07-20 MED ORDER — SODIUM CHLORIDE 0.9 % IV SOLN
1.0000 g | Freq: Three times a day (TID) | INTRAVENOUS | Status: DC
  Filled 2024-07-20 (×2): qty 5

## 2024-07-20 MED ORDER — CIPROFLOXACIN HCL 500 MG PO TABS
500.0000 mg | ORAL_TABLET | Freq: Two times a day (BID) | ORAL | Status: DC
  Administered 2024-07-20 – 2024-07-21 (×2): 500 mg via ORAL
  Filled 2024-07-20 (×2): qty 1

## 2024-07-20 NOTE — Progress Notes (Addendum)
 [facility] Progress Note  Patient: Stephanie Thornton     FMW:[id number]  DOA: 07/18/2024   PCP: Austin Mutton, MD       Brief hospital course: This is a 53 year old female with gastroesophageal reflux disease, CVA, coronary artery disease, hypertension and type 2 diabetes mellitus who uses an insulin  pump.  She presents for nausea, vomiting, poor oral intake and syncope.  Apparently she was unresponsive for about 3 minutes at home. In the ED: Noted to have a UA consistent with a UTI and a lactic acid of 2.4.  Creatinine was elevated at 2.90.  Subjective:  Good oral intake today.   Assessment and Plan: Principal Problem:   Severe sepsis - hypotension, tachypnea, lactic acidosis - Likely secondary to a UTI - The patient has grown E faecalis in the past - noted that she is allergic to PCN, Sulfa and Cephalosporins - receiving Meropenem -  will need to f/u urine culture - appreciate ID input - Have switched to Cipro  BID which should cover E faecalis  Active Problems:   Syncope-orthostatic hypotension with a history of hypertension. -  syncope likely secondary to orthostatic hypotension- she was noted to be orthostatic on admission-treated with IV fluids and improved- repeat orthostatics negative - Continues to be mildly hypotensive- BP 115/74 - Losartan /HCTZ and Atenolol  being held    AKI (acute kidney injury) - Secondary to dehydration- has improved  Hypokalemia - replacing  Chronic constipation - In the setting of chronic narcotic use - Fleet enema ordered along with Miralax  and Senna    Uncontrolled type 2 diabetes mellitus with hyperglycemia - A1c 9.9 - The patient uses an insulin  pump as outpatient-currently does not have her pump with her but could certainly resume her pump if someone was to bring it back to the hospital - Currently on Semglee  and low-dose sliding scale - Continue to follow glucose    Bipolar disorder (HCC) - Continue Latuda     History of  stroke - Continue aspirin     OSA (obstructive sleep apnea) - cont CPAP at night  Chronic pain - Continue oxycodone  which the patient takes at home for arthritis    Class 1 obesity Body mass index is 32.9 kg/m.     Code Status: Limited: Do not attempt resuscitation (DNR) -DNR-LIMITED -Do Not Intubate/DNI  Total time on patient care: 35 minutes DVT prophylaxis: Lovenox     Objective:   Vitals:   07/19/24 1418 07/19/24 1956 07/20/24 0439 07/20/24 1249  BP:  (!) 143/72 120/77 115/74  Pulse:  87 79 82  Resp:  18 16 16   Temp:  97.8 F (36.6 C) 98.4 F (36.9 C) 98.6 F (37 C)  TempSrc:  Oral Oral Oral  SpO2: 93% 97% 97% 96%  Weight:      Height:       Filed Weights   07/18/24 1252  Weight: 81.6 kg   Exam: General exam: Appears comfortable  HEENT: oral mucosa moist Respiratory system: Clear to auscultation.  Cardiovascular system: S1 & S2 heard  Gastrointestinal system: Abdomen soft, non-tender, nondistended. Normal bowel sounds   Extremities: No cyanosis, clubbing or edema Psychiatry:  Mood & affect appropriate.    CBC: Recent Labs  Lab 07/18/24 1314 07/18/24 1322 07/19/24 0504  WBC 9.7  --  7.5  NEUTROABS 6.4  --   --   HGB 13.6 14.6 12.2  HCT 42.3 43.0 37.8  MCV 83.8  --  83.4  PLT 206  --  202   Basic Metabolic Panel:  Recent Labs  Lab 07/18/24 1314 07/18/24 1322 07/19/24 0504 07/20/24 0957  NA 137 136 135 137  K 3.7 3.5 3.2* 3.5  CL 106 105 106 105  CO2 20*  --  21* 24  GLUCOSE 281* 275* 300* 247*  BUN 29* 30* 27* 19  CREATININE 2.90* 3.10* 1.32* 0.78  CALCIUM  8.7*  --  8.5* 8.6*     Scheduled Meds:  amitriptyline   150 mg Oral QHS   aspirin   81 mg Oral Daily   busPIRone   15 mg Oral TID   enoxaparin  (LOVENOX ) injection  40 mg Subcutaneous QHS   escitalopram   5 mg Oral q morning   fluticasone  furoate-vilanterol  1 puff Inhalation Daily   gabapentin   300 mg Oral TID   insulin  aspart  0-9 Units Subcutaneous TID WC   insulin  glargine-yfgn   4 Units Subcutaneous Daily   lurasidone   80 mg Oral QPM   pantoprazole   40 mg Oral Daily   polyethylene glycol  17 g Oral Daily   rosuvastatin   20 mg Oral QHS   senna  1 tablet Oral QHS   topiramate   200 mg Oral QHS   traZODone   200 mg Oral QHS    Imaging and lab data personally reviewed   Author: Shalia Bartko  07/20/2024 2:35 PM  To contact [facility]>   Check the care team in [facility] and look for the attending/consulting [facility] provider listed  Log into [email or web address] and use [place]'s universal password   Go to> [facility]  and find provider  If you still have difficulty reaching the provider, please page the [facility] (Director on Call) for the Hospitalists listed on amion

## 2024-07-20 NOTE — Plan of Care (Signed)
  Problem: Coping: Goal: Ability to adjust to condition or change in health will improve Outcome: Progressing   Problem: Fluid Volume: Goal: Ability to maintain a balanced intake and output will improve Outcome: Progressing   Problem: Nutritional: Goal: Maintenance of adequate nutrition will improve Outcome: Progressing

## 2024-07-20 NOTE — Consult Note (Signed)
 [facility]    Date of Admission:  07/18/2024     Reason for Consult: uti; abx allergy    Referring Provider: Earley     Lines:  Peripheral iv's  Abx: Aztreonam  --> meropenem  fosfomycin        Assessment: 53 yo female with dm2 on insulin  pump, cad, htn/hlp, bipolar, hx cva admitted 7/20 after a syncopal episode in setting of 2 days fever, chill/vomiting/diarrhea/decreased intake after being treated for uti sx 5 days prior with macrobid      Afebrile on admission Ct renal protocol mentions minimal bladder wall thickening and nonbstruting left renal stone without hydronephrosis    7/20 admission bcx ngtd 7/20 ucx ngtd  I reviewed her previous ucx dating back to 2019, most recently e faecalis (which often is not a pathogen), e coli sensitive to cefazolin, group b strep, and at one point staph aureus (??? Query prior foley catheter)   Cefazolin would be the drug of choice here given prior culture data and should have a different side chain from pcn/cephalosporin otherwise   However I suspect the nitrofurantoin  in the beginning was fine, she has almost 2 weeks of very high glucose before the macrobid  was given and had fallen behind on glycemic control and contributing to frequent urination   Given severity of illness concerning for ascending infection of pyelo, macrobid /fosfomycin wouldn't be a good option. As I have low suspicion for e faecalis playing a role, cipro  could be fine too or bactrim (given she has no resistance to those before)     I tried to review her medication allergy Pcn and cephalexin  she clearly remember and said hives/rash Vancomycin anaphylaxis she doesn't remember -- cephalexin  was given ?at the same time around this time She said she take cipro  and bactrim before fine   Plan: Stop iv abx Can start ciprofloxacin  500 mg po bid and plan for 5 more days Please refer to outpatient allergy for pcn allergy  testing Continue standard isolation precaution Id will sign off  Discuss with primary team     ------------------------------------------------ Principal Problem:   Severe sepsis with acute organ dysfunction (HCC) Active Problems:   Bipolar disorder (HCC)   Essential hypertension   HLD (hyperlipidemia)   AKI (acute kidney injury) (HCC)   Coronary artery disease   OSA (obstructive sleep apnea)   Uncontrolled type 2 diabetes mellitus with hyperglycemia (HCC)   GERD (gastroesophageal reflux disease)   Syncope   History of stroke   Sepsis secondary to UTI (HCC)    HPI: Stephanie Thornton is a 53 y.o. female multiple abx allergy, dm2, here with gu infection/severe sepsis   Patient has noticed very elevated blood sugar > 300 at home about 2 weeks ago with associated urinary frequency, thirst She then described burning in urine 4 days prior to admission and subjective f/c  Pcp gave nitrofurantoin   But continues to have sx including progressive weakness and passed out the day of admission so biba  Afebrile in er A few measure of bp in 80s improved with fluid Ct showed nonobstructive stone ureteral and cystitis bladder thickening  She doesn't remember if any flank pain  Bcx and ucx negative here  Started on aztreonam /given a dose fosfomycin and transitioned to meropenem   Blood sugar still high here in the 200s  Aki on presentation cr up to 3.1 but improving with fluid and abx  No complaint today  Poor historian on her allergy  to abx but did mention cephalexin /pcn. Said takes cipro  and bactrim fine before. Doesn't remember the anaphylaxis with vancomycin  Spoke with her daughter in law who doesn't really know about history of allergy either    Family History  Problem Relation Age of Onset   Bipolar disorder Mother    CVA Mother    Bipolar disorder Son    Depression Brother        schizoaffective d/o   Hypertension Sister    Heart disease Maternal Aunt    Heart  attack Maternal Aunt    Hypertension Brother     Social History   Tobacco Use   Smoking status: Former    Current packs/day: 0.00    Average packs/day: 1 pack/day for 30.0 years (30.0 ttl pk-yrs)    Types: Cigarettes    Start date: 09/22/1992    Quit date: 09/22/2022    Years since quitting: 1.8   Smokeless tobacco: Never  Vaping Use   Vaping status: Never Used  Substance Use Topics   Alcohol use: No   Drug use: No    Allergies  Allergen Reactions   Lyrica [Pregabalin] Shortness Of Breath, Itching and Rash   Rocephin  [Ceftriaxone  Sodium In Dextrose ] Shortness Of Breath   Sulfa Antibiotics Hives, Shortness Of Breath and Itching   Ultram [Tramadol] Hives and Shortness Of Breath    Had asthma attack   Vancomycin Anaphylaxis    Per Dr. Precilla   Glucotrol  [Glipizide ] Hives   Keflex  [Cephalexin ] Hives   Metformin  Dermatitis   Penicillins Hives   Janumet  [Sitagliptin  Phos-Metformin  Hcl] Nausea Only    Review of Systems: ROS All Other ROS was negative, except mentioned above   Past Medical History:  Diagnosis Date   Anxiety    Arthritis    knees, feet, back (05/24/2015)   Benign essential HTN 05/01/2015   Bipolar disorder (HCC) diagnosed early 90s   CAD in native artery    Chronic bronchitis (HCC)    get it q yr   Chronic lower back pain    Depression    Diabetes mellitus type II 2010   GERD (gastroesophageal reflux disease)    Headache    maybe 3 times/wk (05/24/2015)   History of hiatal hernia    History of stomach ulcers    HLD (hyperlipidemia)    Menopause    Migraine    1-2/wk (05/24/2015)   NSTEMI (non-ST elevated myocardial infarction) (HCC) 04/2022   OSA on CPAP    Stroke (HCC)    r sided weakness       Scheduled Meds:  amitriptyline   150 mg Oral QHS   aspirin   81 mg Oral Daily   busPIRone   15 mg Oral TID   enoxaparin  (LOVENOX ) injection  40 mg Subcutaneous QHS   escitalopram   5 mg Oral q morning   fluticasone  furoate-vilanterol  1  puff Inhalation Daily   gabapentin   300 mg Oral TID   insulin  aspart  0-9 Units Subcutaneous TID WC   insulin  glargine-yfgn  4 Units Subcutaneous Daily   lurasidone   80 mg Oral QPM   pantoprazole   40 mg Oral Daily   polyethylene glycol  17 g Oral Daily   rosuvastatin   20 mg Oral QHS   senna  1 tablet Oral QHS   topiramate   200 mg Oral QHS   traZODone   200 mg Oral QHS   Continuous Infusions:  meropenem  (MERREM ) IV 1 g (07/20/24 0633)   PRN Meds:.acetaminophen  **OR** acetaminophen , bisacodyl , hydrOXYzine , ondansetron  **  OR** ondansetron  (ZOFRAN ) IV, oxyCODONE    OBJECTIVE: Blood pressure 115/74, pulse 82, temperature 98.6 F (37 C), temperature source Oral, resp. rate 16, height 5' 2 (1.575 m), weight 81.6 kg, SpO2 96%.  Physical Exam  General/constitutional: no distress, pleasant; eating lunch HEENT: Normocephalic, PER, Conj Clear, EOMI, Oropharynx clear Neck supple CV: rrr no mrg Lungs: clear to auscultation, normal respiratory effort Abd: Soft, Nontender Ext: no edema Skin: No Rash Neuro: nonfocal MSK: no peripheral joint swelling/tenderness/warmth; back spines nontender      Lab Results Lab Results  Component Value Date   WBC 7.5 07/19/2024   HGB 12.2 07/19/2024   HCT 37.8 07/19/2024   MCV 83.4 07/19/2024   PLT 202 07/19/2024    Lab Results  Component Value Date   CREATININE 0.78 07/20/2024   BUN 19 07/20/2024   NA 137 07/20/2024   K 3.5 07/20/2024   CL 105 07/20/2024   CO2 24 07/20/2024    Lab Results  Component Value Date   ALT 12 07/18/2024   AST 15 07/18/2024   GGT 46 09/06/2023   ALKPHOS 91 07/18/2024   BILITOT 0.6 07/18/2024      Microbiology: Recent Results (from the past 240 hours)  Culture, blood (routine x 2)     Status: None (Preliminary result)   Collection Time: 07/18/24  1:36 PM   Specimen: BLOOD  Result Value Ref Range Status   Specimen Description   Final    BLOOD RIGHT ANTECUBITAL Performed at [facility],  [address] [place], [place] [address]    Special Requests   Final    BOTTLES DRAWN AEROBIC AND ANAEROBIC Blood Culture adequate volume Performed at [facility], [address], [place], [place] [address]    Culture   Final    NO GROWTH 2 DAYS Performed at [facility], [address] [place], [place] [address]    Report Status PENDING  Incomplete  Culture, blood (routine x 2)     Status: None (Preliminary result)   Collection Time: 07/18/24  6:32 PM   Specimen: BLOOD RIGHT HAND  Result Value Ref Range Status   Specimen Description   Final    BLOOD RIGHT HAND Performed at [facility], [address], [place], [place] [address]    Special Requests   Final    BOTTLES DRAWN AEROBIC ONLY Blood Culture adequate volume Performed at [facility], [address], [place], [place] [address]    Culture   Final    NO GROWTH 2 DAYS Performed at [facility], [address] [place], [place] [address]    Report Status PENDING  Incomplete     Serology:    Imaging: If present, new imagings (plain films, ct scans, and mri) have been personally visualized and interpreted; radiology reports have been reviewed. Decision making incorporated into the Impression / Recommendations.  7/20 ct renal stone protocol 1. Minimal bladder wall thickening, can be seen with cystitis. 2. Nonobstructing left renal stone. Punctate calcifications of both renal hila may represent nonobstructing stones or vascular calcifications. No hydronephrosis.   Constance ONEIDA Passer, MD [facility] [phone] pager    07/20/2024, 2:45 PM

## 2024-07-20 NOTE — Progress Notes (Signed)
 I went into pt room to put her on her nocturnal CPAP and pt stated that she had an abrasion on her nose from the mask.  I saw the abrasion and offered to place a barrier on her nose so that the abrasion would be covered and she could wear the mask.  Pt agreed.  However, after I came back in the room with the barrier for her nose, pt stated that she rather not go on the machine tonight and wanted to let it heal up some before using the mask again.  Pt is currently on room air with SPO2 of 97%.  Will continue to monitor and treat.

## 2024-07-20 NOTE — Inpatient Diabetes Management (Signed)
 Inpatient Diabetes Program Recommendations  AACE/ADA: New Consensus Statement on Inpatient Glycemic Control (2015)  Target Ranges:  Prepandial:   less than 140 mg/dL      Peak postprandial:   less than 180 mg/dL (1-2 hours)      Critically ill patients:  140 - 180 mg/dL   Lab Results  Component Value Date   GLUCAP 306 (H) 07/20/2024   HGBA1C 9.9 (H) 07/18/2024    Review of Glycemic Control  Diabetes history: DM2 Outpatient Diabetes medications: OmniPod insulin  pump with Humalog, Ozempic 0.25 mg weekly Current orders for Inpatient glycemic control: Semglee  4 daily, Novolog  0-9 TID with meals  CBG 306 this am.  Inpatient Diabetes Program Recommendations:    Increase Semglee  to 12 units daily  Add Novolog  0-5 HS correction  Spoke with pt at bedside late yesterday. Pt states she wears insulin  pump and sister-in-law manages it. Pt does not do any input to pump, only her sister-in-law, who she lives with. Did not fully understand why patient does not manage her pump independently. Would restart pump when pt goes home. Do not restart basal rate until 24H after last Semglee  given.   Discussed impact of nutrition, exercise, stress, sickness, and medications on diabetes control.  Discussed carbohydrates,  along with portion sizes. Pt eating Lays potato chips when this Coordinator walked in.  Pt gave permission for me to contact her sister-in-law for more pump information.  Will follow.  Thank you. Shona Brandy, RD, LDN, CDCES Inpatient Diabetes Coordinator [phone]

## 2024-07-21 DIAGNOSIS — R6520 Severe sepsis without septic shock: Secondary | ICD-10-CM | POA: Diagnosis not present

## 2024-07-21 DIAGNOSIS — A419 Sepsis, unspecified organism: Secondary | ICD-10-CM | POA: Diagnosis not present

## 2024-07-21 DIAGNOSIS — N390 Urinary tract infection, site not specified: Secondary | ICD-10-CM

## 2024-07-21 LAB — GLUCOSE, CAPILLARY: Glucose-Capillary: 370 mg/dL — ABNORMAL HIGH (ref 70–99)

## 2024-07-21 MED ORDER — CIPROFLOXACIN HCL 500 MG PO TABS: 500.0000 mg | ORAL_TABLET | Freq: Two times a day (BID) | ORAL | 0 refills | Status: AC

## 2024-07-21 NOTE — Discharge Summary (Addendum)
 Physician Discharge Summary  Stephanie Thornton FMW:[id number] DOB: [birth year] DOA: 07/18/2024  PCP: Austin Mutton, MD  Admit date: 07/18/2024 Discharge date: 07/21/2024  Admitted From: Home  Discharge disposition: Home   Recommendations for Outpatient Follow-Up:   Follow up with your primary care provider in one week.  Check CBC, BMP, magnesium  in the next visit Patient had urine cultures done in the hospital which is still pending at the time of discharge but has been empirically started on Cipro  as per ID.  Has had good clinical response with it.   Discharge Diagnosis:   Principal Problem:   UTI (urinary tract infection) Active Problems:   Syncope   AKI (acute kidney injury) (HCC)   Uncontrolled type 2 diabetes mellitus with hyperglycemia (HCC)   Essential hypertension   Coronary artery disease   HLD (hyperlipidemia)   Bipolar disorder (HCC)   History of stroke   OSA (obstructive sleep apnea)   GERD (gastroesophageal reflux disease)   Discharge Condition: Improved.  Diet recommendation: Low sodium, heart healthy.  Carbohydrate-modified.    Wound care: None.  Code status: Full.   History of Present Illness:   Stephanie Thornton is a 53 year old female with gastroesophageal reflux disease, CVA, coronary artery disease, hypertension and type 2 diabetes mellitus on an insulin  pump presented to hospital with nausea, vomiting, poor oral intake and syncope.  She had been having 3-4 episodes of vomiting per day with some diarrhea and was recently treated for UTI with with oral antibiotic but continued to have urinary symptoms.  Apparently she was unresponsive for about 3 minutes at home.  In the ED, patient was afebrile.  Patient was noted to have abnormal urinalysis.  Lactate was elevated at 2.4.  Creatinine elevated at 2.9.  CT renal stone with mild bladder wall thickening and nonobstructive left renal stone.  Code sepsis was activated in the ED and patient received IV fluid bolus,  cultures were obtained and was admitted hospital for further evaluation and treatment   Hospital Course:   Following conditions were addressed during hospitalization as listed below,   UTI.  Patient did have hypotension, tachypnea lactic acidosis on presentation with UTI.  No fever leukocytosis or fever. Sepsis ruled out. 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  ID was consulted due to multiple antibiotic allergies.  Was initially on meropenem  and was subsequently changed to Cipro  twice daily.  Plan is for 5-day course of Cipro  from 07/20/2024.  ID has signed off.  Urine culture still pending.  Patient has clinically significantly improved.  At this time she wishes to go home and continue the course of ciprofloxacin .    syncope likely secondary to orthostatic hypotension- Resolved at this time.  No further orthostatic after resuscitation.  Losartan  HCTZ and atenolol  on home will be resumed on discharge since blood pressure has started to rise.    Acute kidney injury secondary to volume depletion and sepsis Resolved at this time.  Initial creatinine at 2.9.  This has normalized to 0.7 after hydration.   Hypokalemia Improved after replacement.  Latest potassium of 3.5   Chronic constipation History of chronic narcotic usage.  Received Fleet enema.  Continue MiraLAX  and Senokot.  Advised bowel regimen on discharge.     Uncontrolled type 2 diabetes mellitus with hyperglycemia Review of recent hemoglobin A1c 9.9.  On insulin  pump as outpatient.  Will resume on discharge    Bipolar disorder Continue Latuda   History of stroke Continue aspirin      OSA (obstructive sleep apnea) Continue CPAP at nighttime   Chronic pain Continue oxycodone  from home.   Class 1 obesity Body mass index is 32.9 kg/m.  Would benefit from ongoing weight loss and lifestyle modifications.  Disposition.  At this time, patient is stable for disposition home with  outpatient PCP follow-up  Medical Consultants:   Infectious disease  Procedures:    None Subjective:   Today, patient was seen and examined at bedside.  Patient denies any fever chills urinary urgency frequency dysuria.  Patient's friend at bedside.  Wants to go home  Discharge Exam:   Vitals:   07/20/24 2134 07/21/24 0430  BP: 136/81 (!) 163/92  Pulse: 76 82  Resp: 17 18  Temp: 98.4 F (36.9 C) 98.5 F (36.9 C)  SpO2: 97% 95%   Vitals:   07/20/24 0439 07/20/24 1249 07/20/24 2134 07/21/24 0430  BP: 120/77 115/74 136/81 (!) 163/92  Pulse: 79 82 76 82  Resp: 16 16 17 18   Temp: 98.4 F (36.9 C) 98.6 F (37 C) 98.4 F (36.9 C) 98.5 F (36.9 C)  TempSrc: Oral Oral Oral Oral  SpO2: 97% 96% 97% 95%  Weight:      Height:       Body mass index is 32.9 kg/m.  General: Alert awake, not in obvious distress, obese built, HENT: pupils equally reacting to light,  No scleral pallor or icterus noted. Oral mucosa is moist.  Chest:   Diminished breath sounds bilaterally. No crackles or wheezes.  CVS: S1 &S2 heard. No murmur.  Regular rate and rhythm. Abdomen: Soft, nontender, nondistended.  Bowel sounds are heard.   Extremities: No cyanosis, clubbing or edema.  Peripheral pulses are palpable. Psych: Alert, awake and oriented, CNS:  No cranial nerve deficits.  Power equal in all extremities.   Skin: Warm and dry.  No rashes noted.  The results of significant diagnostics from this hospitalization (including imaging, microbiology, ancillary and laboratory) are listed below for reference.     Diagnostic Studies:   CT HEAD WO CONTRAST ( ) Result Date: 07/18/2024 EXAM: CT HEAD WITHOUT CONTRAST 07/18/2024 07:19:15 PM TECHNIQUE: CT of the head was performed without the administration of intravenous contrast. Automated exposure control, iterative reconstruction, and/or weight based adjustment of the mA/kV was utilized to reduce the radiation dose to as low as reasonably achievable.  COMPARISON: 10/08/2023. Old left parietal infarct is unchanged. CLINICAL HISTORY: Syncope/presyncope, cerebrovascular cause suspected. FINDINGS: BRAIN AND VENTRICLES: No acute hemorrhage. Gray-white differentiation is preserved. No hydrocephalus. No extra-axial collection. No mass effect or midline shift. Old left parietal infarct is unchanged. ORBITS: No acute abnormality. SINUSES: No acute abnormality. SOFT TISSUES AND SKULL: No acute soft tissue abnormality. No skull fracture. IMPRESSION: 1. No acute intracranial abnormality. 2. Unchanged old left parietal infarct. Electronically signed by: Franky Stanford MD 07/18/2024 07:46 PM EDT RP Workstation: [id number]   CT Renal Stone Study Result Date: 07/18/2024 CLINICAL DATA:  Provided history: Abdominal/flank pain, stone suspected Technologist notes state patient found on ground, weak and lethargic. Patient reports urinary tract infection, currently treated with antibiotics. EXAM: CT ABDOMEN AND PELVIS WITHOUT CONTRAST TECHNIQUE: Multidetector CT imaging of the abdomen and pelvis was performed following the standard protocol without IV contrast. RADIATION DOSE REDUCTION: This exam was performed according to the departmental dose-optimization program which includes automated exposure control, adjustment of the mA and/or kV according to patient size and/or use of iterative reconstruction technique. COMPARISON:  CT 08/07/2022 FINDINGS: Lower  chest: Hypoventilatory changes in the dependent lung bases. No pleural effusion. Hepatobiliary: Unremarkable unenhanced appearance of the liver. Gallbladder physiologically distended, no calcified stone. No biliary dilatation. Pancreas: No ductal dilatation or inflammation. Spleen: Normal in size without focal abnormality. The low-density splenic lesion on prior exam is not seen in the absence of IV contrast. Adrenals/Urinary Tract: No adrenal nodule. 5 mm stone in the upper left kidney. Punctate calcifications of both renal hila may  represent nonobstructing stones or vascular calcifications. No hydronephrosis. No evidence of renal inflammation. The urinary bladder is minimally distended. Minimal bladder wall thickening. No air in the bladder. Stomach/Bowel: No bowel obstruction or inflammation. Stomach is nondistended. The appendix is normal. Small to moderate colonic stool burden with mild colonic redundancy. Vascular/Lymphatic: Aortic and branch atherosclerosis. No aortic aneurysm. Scattered small central mesenteric lymph nodes are not enlarged by size criteria. No suspicious lymphadenopathy. Reproductive: Status post hysterectomy. No adnexal masses. Other: No free air, free fluid, or intra-abdominal fluid collection. Mild rectus diastasis with small fat containing umbilical hernia. Musculoskeletal: Scattered bone islands in the pelvis. There are no acute or suspicious osseous abnormalities. IMPRESSION: 1. Minimal bladder wall thickening, can be seen with cystitis. 2. Nonobstructing left renal stone. Punctate calcifications of both renal hila may represent nonobstructing stones or vascular calcifications. No hydronephrosis. Aortic Atherosclerosis (ICD10-I70.0). Electronically Signed   By: Andrea Gasman M.D.   On: 07/18/2024 15:07   DG Chest Port 1 View Result Date: 07/18/2024 CLINICAL DATA:  sepsis EXAM: PORTABLE CHEST 1 VIEW COMPARISON:  Chest x-ray 09/05/2023 FINDINGS: Prominent cardiac silhouette likely due to AP portable technique. The heart and mediastinal contours are unchanged. Atherosclerotic plaque. No focal consolidation. No pulmonary edema. No pleural effusion. No pneumothorax. No acute osseous abnormality. IMPRESSION: 1. No active disease. 2.  Aortic Atherosclerosis (ICD10-I70.0). Electronically Signed   By: Morgane  Naveau M.D.   On: 07/18/2024 13:51     Labs:   Basic Metabolic Panel: Recent Labs  Lab 07/18/24 1314 07/18/24 1322 07/19/24 0504 07/20/24 0957  NA 137 136 135 137  K 3.7 3.5 3.2* 3.5  CL 106 105  106 105  CO2 20*  --  21* 24  GLUCOSE 281* 275* 300* 247*  BUN 29* 30* 27* 19  CREATININE 2.90* 3.10* 1.32* 0.78  CALCIUM  8.7*  --  8.5* 8.6*   GFR Estimated Creatinine Clearance: 81.4 mL/min (by C-G formula based on SCr of 0.78 mg/dL). Liver Function Tests: Recent Labs  Lab 07/18/24 1314  AST 15  ALT 12  ALKPHOS 91  BILITOT 0.6  PROT 6.9  ALBUMIN 3.6   Recent Labs  Lab 07/18/24 1314  LIPASE 31   No results for input(s): AMMONIA in the last 168 hours. Coagulation profile No results for input(s): INR, PROTIME in the last 168 hours.  CBC: Recent Labs  Lab 07/18/24 1314 07/18/24 1322 07/19/24 0504  WBC 9.7  --  7.5  NEUTROABS 6.4  --   --   HGB 13.6 14.6 12.2  HCT 42.3 43.0 37.8  MCV 83.8  --  83.4  PLT 206  --  202   Cardiac Enzymes: No results for input(s): CKTOTAL, CKMB, CKMBINDEX, TROPONINI in the last 168 hours. BNP: Invalid input(s): POCBNP CBG: Recent Labs  Lab 07/20/24 1129 07/20/24 1607 07/20/24 1752 07/20/24 2131 07/21/24 0844  GLUCAP 154* 199* 220* 277* 370*   D-Dimer No results for input(s): DDIMER in the last 72 hours. Hgb A1c No results for input(s): HGBA1C in the last 72 hours.  Lipid Profile  No results for input(s): CHOL, HDL, LDLCALC, TRIG, CHOLHDL, LDLDIRECT in the last 72 hours. Thyroid  function studies No results for input(s): TSH, T4TOTAL, T3FREE, THYROIDAB in the last 72 hours.  Invalid input(s): FREET3 Anemia work up No results for input(s): VITAMINB12, FOLATE, FERRITIN, TIBC, IRON, RETICCTPCT in the last 72 hours. Microbiology Recent Results (from the past 240 hours)  Culture, blood (routine x 2)     Status: None (Preliminary result)   Collection Time: 07/18/24  1:36 PM   Specimen: BLOOD  Result Value Ref Range Status   Specimen Description   Final    BLOOD RIGHT ANTECUBITAL Performed at [facility], [address], [place], [place] [address]     Special Requests   Final    BOTTLES DRAWN AEROBIC AND ANAEROBIC Blood Culture adequate volume Performed at [facility], [address], [place], [place] [address]    Culture   Final    NO GROWTH 3 DAYS Performed at [facility], [address], [place], [place] [address]    Report Status PENDING  Incomplete  Urine Culture (for pregnant, neutropenic or urologic patients or patients with an indwelling urinary catheter)     Status: None (Preliminary result)   Collection Time: 07/18/24  1:52 PM   Specimen: Urine, Clean Catch  Result Value Ref Range Status   Specimen Description   Final    URINE, CLEAN CATCH Performed at [facility] [address] [place], [place] [address]    Special Requests   Final    NONE Performed at [facility], [address] [place], [place] [address]    Culture   Final    CULTURE REINCUBATED FOR BETTER GROWTH Performed at [facility], [address], [place], [place] [address]    Report Status PENDING  Incomplete  Culture, blood (routine x 2)     Status: None (Preliminary result)   Collection Time: 07/18/24  6:32 PM   Specimen: BLOOD RIGHT HAND  Result Value Ref Range Status   Specimen Description   Final    BLOOD RIGHT HAND Performed at [place] [facility], [address] [place], [place] [address]    Special Requests   Final    BOTTLES DRAWN AEROBIC ONLY Blood Culture adequate volume Performed at [facility], [address] [place], [place] [address]    Culture   Final    NO GROWTH 3 DAYS Performed at [facility], [address] [place], [place] [address]    Report Status PENDING  Incomplete     Discharge Instructions:   Discharge Instructions     Call MD for:  persistant nausea and vomiting   Complete by: As directed    Call MD for:  temperature >100.4   Complete by: As directed    Diet - low sodium heart healthy   Complete by: As  directed    Discharge instructions   Complete by: As directed    Follow up with your primary care provider in one week. Complete the course of antibiotics. Increase fluid intake. Seek medical attention for worsening symptoms.   Increase activity slowly   Complete by: As directed       Allergies as of 07/21/2024       Reactions   Lyrica [pregabalin] Shortness Of Breath, Itching, Rash   Rocephin  [ceftriaxone  Sodium In Dextrose ] Shortness Of Breath   Sulfa Antibiotics Hives, Shortness Of Breath, Itching  Ultram [tramadol] Hives, Shortness Of Breath   Had asthma attack   Vancomycin Anaphylaxis   Per Dr. Precilla   Glucotrol  [glipizide ] Hives   Keflex  [place] ] Hives   Metformin  Dermatitis   Penicillins Hives   Janumet  [sitagliptin  Phos-metformin  Hcl] Nausea Only        Medication List     TAKE these medications    amitriptyline  150 MG tablet Commonly known as: ELAVIL  Take 150 mg by mouth at bedtime.   amLODipine  10 MG tablet Commonly known as: NORVASC  Take 1 tablet (10 mg total) by mouth daily.   aspirin  81 MG chewable tablet Chew 1 tablet (81 mg total) by mouth daily.   atenolol  50 MG tablet Commonly known as: TENORMIN  Take 50 mg by mouth daily.   baclofen  10 MG tablet Commonly known as: LIORESAL  Take 10 mg by mouth 3 (three) times daily.   busPIRone  15 MG tablet Commonly known as: BUSPAR  Take 15 mg by mouth 3 (three) times daily.   ciprofloxacin  500 MG tablet Commonly known as: CIPRO  Take 1 tablet (500 mg total) by mouth 2 (two) times daily for 4 days.   escitalopram  5 MG tablet Commonly known as: LEXAPRO  Take 5 mg by mouth every morning.   gabapentin  300 MG capsule Commonly known as: NEURONTIN  Take 1 capsule (300 mg total) by mouth 3 (three) times daily.   HumaLOG 100 UNIT/ML injection Generic drug: insulin  lispro 60 UNITS DAILY VIA INSULIN  PUMP INJECTION 30 DAYS   hydrOXYzine  25 MG tablet Commonly known as: ATARAX  TAKE 1 TABLET BY ORAL ROUTE  3 TIMES PER DAY TAKE FOR ANXIETY, PANIC, AGITATION, AND SLEEP AS NEEDED   Insulin  Pen Needle 31G X 5 MM Misc 1 Units by Does not apply route 4 (four) times daily.   Latuda  80 MG Tabs tablet Generic drug: lurasidone  Take 80 mg by mouth every evening.   lidocaine  5 % Commonly known as: Lidoderm  Place 1 patch onto the skin daily. Remove & Discard patch within 12 hours or as directed by MD   lisinopril -hydrochlorothiazide  20-12.5 MG tablet Commonly known as: ZESTORETIC  Take 1 tablet by mouth daily.   Macrobid  100 MG capsule Generic drug: nitrofurantoin  (macrocrystal-monohydrate) Take 100 mg by mouth 2 (two) times daily.   nitroGLYCERIN  0.4 MG SL tablet Commonly known as: NITROSTAT  Place 1 tablet (0.4 mg total) under the tongue every 5 (five) minutes x 3 doses as needed for chest pain.   Oxycodone  HCl 10 MG Tabs Take 10 mg by mouth 4 (four) times daily as needed (pain).   OZEMPIC (0.25 OR 0.5 MG/DOSE) [place] Inject 0.25 mg into the skin once a week. Tuesdays   pantoprazole  40 MG tablet Commonly known as: PROTONIX  Take 40 mg by mouth daily.   rosuvastatin  20 MG tablet Commonly known as: CRESTOR  Take 20 mg by mouth at bedtime.   Symbicort 160-4.5 MCG/ACT inhaler Generic drug: budesonide-formoterol SMARTSIG:2 inhalation Via Inhaler Twice Daily PRN   topiramate  200 MG tablet Commonly known as: TOPAMAX  Take 200 mg by mouth at bedtime.   traZODone  100 MG tablet Commonly known as: DESYREL  Take 250 mg by mouth at bedtime.   TYLENOL  PO Take 2 tablets by mouth 3 (three) times daily as needed (tooth ache).        Follow-up Information     Austin Mutton, MD Follow up in 1 week(s).   Specialty: Internal Medicine Contact information: [address] [place] [address] [phone]  Time coordinating discharge: 39 minutes  Signed:  Lynsee Thornton  [facility] 07/21/2024, 3:50 PM

## 2024-07-21 NOTE — Plan of Care (Signed)
  Problem: Health Behavior/Discharge Planning: Goal: Ability to manage health-related needs will improve Outcome: Progressing   Problem: Metabolic: Goal: Ability to maintain appropriate glucose levels will improve Outcome: Progressing   Problem: Nutritional: Goal: Maintenance of adequate nutrition will improve Outcome: Progressing   Problem: Skin Integrity: Goal: Risk for impaired skin integrity will decrease Outcome: Progressing   Problem: Health Behavior/Discharge Planning: Goal: Ability to manage health-related needs will improve Outcome: Progressing

## 2024-07-21 NOTE — TOC Transition Note (Signed)
 Transition of Care [facility]) - Discharge Note   Patient Details  Name: Stephanie Thornton MRN: [id number] Date of Birth: 1971-02-18  [facility] CM/SW Contact:  Bascom Service, RN Phone Number: 07/21/2024, 10:19 AM   Clinical Narrative:  d/c home no needs.     Final next level of care: Home/Self Care Barriers to Discharge: No Barriers Identified   Patient Goals and CMS Choice Patient states their goals for this hospitalization and ongoing recovery are:: Home CMS Medicare.gov Compare Post Acute Care list provided to:: Patient Choice offered to / list presented to : Patient [place] ownership interest in [facility].provided to:: Patient    Discharge Placement                       Discharge Plan and Services Additional resources added to the After Visit Summary for                                       Social Drivers of Health (SDOH) Interventions SDOH Screenings   Food Insecurity: No Food Insecurity (07/18/2024)  Housing: Unknown (07/18/2024)  Transportation Needs: No Transportation Needs (07/18/2024)  Utilities: Not At Risk (07/18/2024)  Depression (PHQ2-9): Medium Risk (05/28/2019)  Social Connections: Unknown (01/13/2023)   Received from [facility]  Tobacco Use: Medium Risk (07/18/2024)     Readmission Risk Interventions     No data to display

## 2024-07-22 LAB — URINE CULTURE: Culture: 40000 — AB

## 2024-07-23 LAB — CULTURE, BLOOD (ROUTINE X 2)
Culture: NO GROWTH
Culture: NO GROWTH
Special Requests: ADEQUATE
Special Requests: ADEQUATE

## 2024-08-24 ENCOUNTER — Other Ambulatory Visit: Payer: Self-pay | Admitting: Nurse Practitioner

## 2024-08-24 DIAGNOSIS — E042 Nontoxic multinodular goiter: Secondary | ICD-10-CM

## 2024-08-27 ENCOUNTER — Other Ambulatory Visit: Payer: Self-pay | Admitting: Internal Medicine

## 2024-08-27 DIAGNOSIS — Z1231 Encounter for screening mammogram for malignant neoplasm of breast: Secondary | ICD-10-CM

## 2024-09-01 ENCOUNTER — Ambulatory Visit
Admission: RE | Admit: 2024-09-01 | Discharge: 2024-09-01 | Disposition: A | Discharge status: Home / Self Care | Source: Ambulatory Visit | Attending: Nurse Practitioner | Admitting: Nurse Practitioner

## 2024-09-01 DIAGNOSIS — E042 Nontoxic multinodular goiter: Secondary | ICD-10-CM

## 2024-09-07 ENCOUNTER — Other Ambulatory Visit: Payer: Self-pay | Admitting: Nurse Practitioner

## 2024-09-07 DIAGNOSIS — E042 Nontoxic multinodular goiter: Secondary | ICD-10-CM

## 2024-09-13 ENCOUNTER — Ambulatory Visit

## 2024-09-20 ENCOUNTER — Ambulatory Visit

## 2024-10-08 ENCOUNTER — Emergency Department (HOSPITAL_COMMUNITY)

## 2024-10-08 ENCOUNTER — Emergency Department (HOSPITAL_COMMUNITY)
Admission: EM | Admit: 2024-10-08 | Discharge: 2024-10-08 | Disposition: A | Discharge status: Home / Self Care | Attending: Emergency Medicine | Admitting: Emergency Medicine

## 2024-10-08 DIAGNOSIS — I2510 Atherosclerotic heart disease of native coronary artery without angina pectoris: Secondary | ICD-10-CM | POA: Insufficient documentation

## 2024-10-08 DIAGNOSIS — Z794 Long term (current) use of insulin: Secondary | ICD-10-CM | POA: Diagnosis not present

## 2024-10-08 DIAGNOSIS — N179 Acute kidney failure, unspecified: Secondary | ICD-10-CM | POA: Insufficient documentation

## 2024-10-08 DIAGNOSIS — K529 Noninfective gastroenteritis and colitis, unspecified: Secondary | ICD-10-CM | POA: Diagnosis not present

## 2024-10-08 DIAGNOSIS — E109 Type 1 diabetes mellitus without complications: Secondary | ICD-10-CM | POA: Diagnosis not present

## 2024-10-08 DIAGNOSIS — I10 Essential (primary) hypertension: Secondary | ICD-10-CM | POA: Diagnosis not present

## 2024-10-08 DIAGNOSIS — Z79899 Other long term (current) drug therapy: Secondary | ICD-10-CM | POA: Diagnosis not present

## 2024-10-08 DIAGNOSIS — R1084 Generalized abdominal pain: Secondary | ICD-10-CM | POA: Diagnosis present

## 2024-10-08 DIAGNOSIS — Z7982 Long term (current) use of aspirin: Secondary | ICD-10-CM | POA: Diagnosis not present

## 2024-10-08 DIAGNOSIS — E860 Dehydration: Secondary | ICD-10-CM | POA: Diagnosis not present

## 2024-10-08 LAB — CBC WITH DIFFERENTIAL/PLATELET
Abs Immature Granulocytes: 0.04 K/uL (ref 0.00–0.07)
Basophils Absolute: 0 K/uL (ref 0.0–0.1)
Basophils Relative: 0 %
Eosinophils Absolute: 0.1 K/uL (ref 0.0–0.5)
Eosinophils Relative: 1 %
HCT: 49.3 % — ABNORMAL HIGH (ref 36.0–46.0)
Hemoglobin: 15.7 g/dL — ABNORMAL HIGH (ref 12.0–15.0)
Immature Granulocytes: 0 %
Lymphocytes Relative: 42 %
Lymphs Abs: 4.2 K/uL — ABNORMAL HIGH (ref 0.7–4.0)
MCH: 26.3 pg (ref 26.0–34.0)
MCHC: 31.8 g/dL (ref 30.0–36.0)
MCV: 82.7 fL (ref 80.0–100.0)
Monocytes Absolute: 0.5 K/uL (ref 0.1–1.0)
Monocytes Relative: 5 %
Neutro Abs: 5.1 K/uL (ref 1.7–7.7)
Neutrophils Relative %: 52 %
Platelets: 118 K/uL — ABNORMAL LOW (ref 150–400)
RBC: 5.96 MIL/uL — ABNORMAL HIGH (ref 3.87–5.11)
RDW: 14.6 % (ref 11.5–15.5)
WBC: 9.9 K/uL (ref 4.0–10.5)
nRBC: 0 % (ref 0.0–0.2)

## 2024-10-08 LAB — MAGNESIUM: Magnesium: 1.6 mg/dL — ABNORMAL LOW (ref 1.7–2.4)

## 2024-10-08 LAB — URINALYSIS, ROUTINE W REFLEX MICROSCOPIC
Bilirubin Urine: NEGATIVE
Glucose, UA: NEGATIVE mg/dL
Hgb urine dipstick: NEGATIVE
Ketones, ur: NEGATIVE mg/dL
Nitrite: NEGATIVE
Protein, ur: 30 mg/dL — AB
Specific Gravity, Urine: 1.019 (ref 1.005–1.030)
WBC, UA: >50 WBC/hpf (ref 0–5)
pH: 5 (ref 5.0–8.0)

## 2024-10-08 LAB — CBG MONITORING, ED: Glucose-Capillary: 160 mg/dL — ABNORMAL HIGH (ref 70–99)

## 2024-10-08 LAB — COMPREHENSIVE METABOLIC PANEL WITH GFR
ALT: 14 U/L (ref 0–44)
AST: 19 U/L (ref 15–41)
Albumin: 3.4 g/dL — ABNORMAL LOW (ref 3.5–5.0)
Alkaline Phosphatase: 89 U/L (ref 38–126)
Anion gap: 13 (ref 5–15)
BUN: 35 mg/dL — ABNORMAL HIGH (ref 6–20)
CO2: 19 mmol/L — ABNORMAL LOW (ref 22–32)
Calcium: 8.7 mg/dL — ABNORMAL LOW (ref 8.9–10.3)
Chloride: 101 mmol/L (ref 98–111)
Creatinine, Ser: 1.62 mg/dL — ABNORMAL HIGH (ref 0.44–1.00)
GFR, Estimated: 38 mL/min — ABNORMAL LOW (ref >60)
Glucose, Bld: 150 mg/dL — ABNORMAL HIGH (ref 70–99)
Potassium: 3.9 mmol/L (ref 3.5–5.1)
Sodium: 133 mmol/L — ABNORMAL LOW (ref 135–145)
Total Bilirubin: 1.1 mg/dL (ref 0.0–1.2)
Total Protein: 6.6 g/dL (ref 6.5–8.1)

## 2024-10-08 LAB — LIPASE, BLOOD: Lipase: 28 U/L (ref 11–51)

## 2024-10-08 MED ORDER — MAGNESIUM SULFATE 2 GM/50ML IV SOLN
2.0000 g | Freq: Once | INTRAVENOUS | Status: AC
  Administered 2024-10-08: 2 g via INTRAVENOUS
  Filled 2024-10-08: qty 50

## 2024-10-08 MED ORDER — LACTATED RINGERS IV BOLUS
1000.0000 mL | Freq: Once | INTRAVENOUS | Status: AC
  Administered 2024-10-08: 1000 mL via INTRAVENOUS

## 2024-10-08 MED ORDER — IOHEXOL 350 MG/ML SOLN
75.0000 mL | Freq: Once | INTRAVENOUS | Status: AC | PRN
Start: 2024-10-08 — End: 2024-10-08
  Administered 2024-10-08: 75 mL via INTRAVENOUS

## 2024-10-08 NOTE — ED Provider Notes (Signed)
 [place] EMERGENCY DEPARTMENT AT [facility] Provider Note   CSN: [id number] Arrival date & time: 10/08/24  1652     Patient presents with: No chief complaint on file.   Stephanie Thornton is a 53 y.o. female.   HPI Patient is a 53 year old female PMH of insulin -dependent diabetes,  CAD s/p PCI, HTN, HLD, bipolar disorder presenting to the ED via EMS from UC with concern for soft blood pressure SBP ~100 in the setting of generalized weakness, generalized abdominal discomfort, and nausea with NBNB emesis since yesterday.  Patient further endorses subjective fevers with chills.  Per EMS, 500 cc NS was given in the prehospital setting and POC glucose was 250.  Patient denies headache, vision changes, chest pain, shortness of breath, back pain, changes in urination including dysuria, hematuria, changes in frequency, as well as changes in bowel movements.  No diarrhea or constipation.  Patient reports that she had 6-7 NBNB emesis episodes yesterday and 2-3 today.    Prior to Admission medications   Medication Sig Start Date End Date Taking? Authorizing Provider  Acetaminophen  (TYLENOL  PO) Take 2 tablets by mouth 3 (three) times daily as needed (tooth ache).    [provider]  amitriptyline  (ELAVIL ) 150 MG tablet Take 150 mg by mouth at bedtime. 08/06/22   [provider]  amLODipine  (NORVASC ) 10 MG tablet Take 1 tablet (10 mg total) by mouth daily. 09/23/22   Shlomo Wilbert SAUNDERS, MD  aspirin  81 MG chewable tablet Chew 1 tablet (81 mg total) by mouth daily. 05/09/22   Zhao, Xika, NP  atenolol  (TENORMIN ) 50 MG tablet Take 50 mg by mouth daily. 10/24/23   [provider]  baclofen  (LIORESAL ) 10 MG tablet Take 10 mg by mouth 3 (three) times daily. 05/05/22   [provider]  busPIRone  (BUSPAR ) 15 MG tablet Take 15 mg by mouth 3 (three) times daily. 07/10/24   [provider]  escitalopram  (LEXAPRO ) 5 MG tablet Take 5 mg by mouth every morning. 07/10/24    [provider]  gabapentin  (NEURONTIN ) 300 MG capsule Take 1 capsule (300 mg total) by mouth 3 (three) times daily. Patient not taking: Reported on 07/19/2024 11/11/22     HUMALOG 100 UNIT/ML injection 60 UNITS DAILY VIA INSULIN  PUMP INJECTION 30 DAYS    [provider]  hydrOXYzine  (ATARAX ) 25 MG tablet TAKE 1 TABLET BY ORAL ROUTE 3 TIMES PER DAY TAKE FOR ANXIETY, PANIC, AGITATION, AND SLEEP AS NEEDED    [provider]  Insulin  Pen Needle 31G X 5 MM MISC 1 Units by Does not apply route 4 (four) times daily. 09/07/23   Rojelio Nest, DO  LATUDA  80 MG TABS tablet Take 80 mg by mouth every evening. 02/01/19   [provider]  lidocaine  (LIDODERM ) 5 % Place 1 patch onto the skin daily. Remove & Discard patch within 12 hours or as directed by MD Patient not taking: Reported on 07/19/2024 12/31/23   Ruthe Cornet, DO  lisinopril -hydrochlorothiazide  (ZESTORETIC ) 20-12.5 MG tablet Take 1 tablet by mouth daily.    [provider]  MACROBID  100 MG capsule Take 100 mg by mouth 2 (two) times daily. 07/13/24   [provider]  nitroGLYCERIN  (NITROSTAT ) 0.4 MG SL tablet Place 1 tablet (0.4 mg total) under the tongue every 5 (five) minutes x 3 doses as needed for chest pain. 05/09/22   Zhao, Xika, NP  Oxycodone  HCl 10 MG TABS Take 10 mg by mouth 4 (four) times daily as needed (  pain). 07/08/22   [provider]  pantoprazole  (PROTONIX ) 40 MG tablet Take 40 mg by mouth daily. 02/19/24   [provider]  rosuvastatin  (CRESTOR ) 20 MG tablet Take 20 mg by mouth at bedtime. 07/29/23   [provider]  Semaglutide (OZEMPIC, 0.25 OR 0.5 MG/DOSE, ) Inject 0.25 mg into the skin once a week. Tuesdays    [provider]  SYMBICORT 160-4.5 MCG/ACT inhaler SMARTSIG:2 inhalation Via Inhaler Twice Daily PRN 04/24/24   [provider]  topiramate  (TOPAMAX ) 200 MG tablet Take 200 mg by mouth at bedtime. 01/28/19   [provider]   traZODone  (DESYREL ) 100 MG tablet Take 250 mg by mouth at bedtime. 02/24/19   [provider]    Allergies: Lyrica [pregabalin], Rocephin  [ceftriaxone  sodium in dextrose ], Sulfa antibiotics, Ultram [tramadol], Vancomycin, Glucotrol  [glipizide ], Keflex  [cephalexin ], Metformin , Penicillins, and Janumet  [sitagliptin  phos-metformin  hcl]    Review of Systems  Updated Vital Signs BP (!) 114/103   Pulse 83   Temp 98.2 F (36.8 C) (Axillary)   Resp 17   SpO2 100%   Physical Exam Constitutional:      Appearance: Normal appearance.  HENT:     Head: Normocephalic and atraumatic.     Nose: Nose normal.     Mouth/Throat:     Mouth: Mucous membranes are dry.  Eyes:     Extraocular Movements: Extraocular movements intact.     Pupils: Pupils are equal, round, and reactive to light.  Cardiovascular:     Rate and Rhythm: Normal rate and regular rhythm.     Pulses: Normal pulses.     Heart sounds: Normal heart sounds.  Pulmonary:     Effort: Pulmonary effort is normal.     Breath sounds: Normal breath sounds.  Abdominal:     General: Abdomen is flat.     Palpations: Abdomen is soft.  Musculoskeletal:        General: Normal range of motion.     Cervical back: Normal range of motion.  Skin:    General: Skin is warm and dry.     Capillary Refill: Capillary refill takes 2 to 3 seconds.  Neurological:     General: No focal deficit present.     Mental Status: She is alert and oriented to person, place, and time.     (all labs ordered are listed, but only abnormal results are displayed) Labs Reviewed  CBC WITH DIFFERENTIAL/PLATELET - Abnormal; Notable for the following components:      Result Value   RBC 5.96 (*)    Hemoglobin 15.7 (*)    HCT 49.3 (*)    Platelets 118 (*)    Lymphs Abs 4.2 (*)    All other components within normal limits  COMPREHENSIVE METABOLIC PANEL WITH GFR - Abnormal; Notable for the following components:   Sodium 133 (*)    CO2 19 (*)    Glucose, Bld  150 (*)    BUN 35 (*)    Creatinine, Ser 1.62 (*)    Calcium  8.7 (*)    Albumin 3.4 (*)    GFR, Estimated 38 (*)    All other components within normal limits  MAGNESIUM  - Abnormal; Notable for the following components:   Magnesium  1.6 (*)    All other components within normal limits  URINALYSIS, ROUTINE W REFLEX MICROSCOPIC - Abnormal; Notable for the following components:   Color, Urine AMBER (*)    APPearance CLOUDY (*)    Protein, ur 30 (*)  Leukocytes,Ua MODERATE (*)    Bacteria, UA FEW (*)    All other components within normal limits  CBG MONITORING, ED - Abnormal; Notable for the following components:   Glucose-Capillary 160 (*)    All other components within normal limits  LIPASE, BLOOD    EKG: EKG Interpretation Date/Time:  Friday October 08 2024 17:06:19 EDT Ventricular Rate:  80 PR Interval:  153 QRS Duration:  84 QT Interval:  403 QTC Calculation: 465 R Axis:   -11  Text Interpretation: Sinus rhythm LAE, consider biatrial enlargement Consider anterior infarct Nonspecific T abnormalities, lateral leads possible QT prolongation but flattened T waves Confirmed by Cleotilde Rogue ([id number]) on 10/08/2024 5:10:43 PM  Radiology: CT ABDOMEN PELVIS W CONTRAST Result Date: 10/08/2024 CLINICAL DATA:  Abdominal pain. Upper abdominal pain with nausea and vomiting. EXAM: CT ABDOMEN AND PELVIS WITH CONTRAST TECHNIQUE: Multidetector CT imaging of the abdomen and pelvis was performed using the standard protocol following bolus administration of intravenous contrast. RADIATION DOSE REDUCTION: This exam was performed according to the departmental dose-optimization program which includes automated exposure control, adjustment of the mA and/or kV according to patient size and/or use of iterative reconstruction technique. CONTRAST:  75mL OMNIPAQUE  IOHEXOL  350 MG/ML SOLN COMPARISON:  07/18/2024 FINDINGS: Lower chest: Somewhat linear infiltrates in the lung bases probably representing  atelectasis. Cardiac enlargement. Hepatobiliary: No focal liver abnormality is seen. No gallstones, gallbladder wall thickening, or biliary dilatation. Pancreas: Unremarkable. No pancreatic ductal dilatation or surrounding inflammatory changes. Spleen: Normal in size without focal abnormality. Adrenals/Urinary Tract: No adrenal gland nodules. 5 mm stone in the upper pole left kidney. Nephrograms are homogeneous and symmetrical. No hydronephrosis or hydroureter. Bladder is decompressed. Stomach/Bowel: Stomach, small bowel, and colon are not abnormally distended. There is wall thickening and edema involving the cecum and ascending colon, extending to the hepatic flexure. Wall thickening is also suggested in the terminal ileum although underdistention limits evaluation. This is likely to represent infectious or inflammatory colitis. Scattered stool throughout the colon. No loculated collections. Appendix is normal. Vascular/Lymphatic: Aortic atherosclerosis. No enlarged abdominal or pelvic lymph nodes. Reproductive: Status post hysterectomy. No adnexal masses. Other: No abdominal wall hernia or abnormality. No abdominopelvic ascites. Musculoskeletal: No acute or significant osseous findings. IMPRESSION: 1. Right colonic and terminal ileum wall thickening and edema without evidence of obstruction. This most likely represents infectious or inflammatory colitis. 2. Nonobstructing stone in the left kidney. 3. Aortic atherosclerosis. Electronically Signed   By: Elsie Gravely M.D.   On: 10/08/2024 19:51   DG Chest Portable 1 View Result Date: 10/08/2024 CLINICAL DATA:  Hyperglycemia. EXAM: PORTABLE CHEST 1 VIEW COMPARISON:  July 18, 2024 FINDINGS: The heart size and mediastinal contours are within normal limits. Mild atelectasis is noted within the right lung base. No acute infiltrate, pleural effusion or pneumothorax is identified. The visualized skeletal structures are unremarkable. IMPRESSION: No acute  cardiopulmonary disease. Electronically Signed   By: Suzen Dials M.D.   On: 10/08/2024 18:10    Procedures   Medications Ordered in the ED  magnesium  sulfate IVPB 2 g 50 mL (2 g Intravenous New Bag/Given 10/08/24 1946)  lactated ringers  bolus 1,000 mL (0 mLs Intravenous Stopped 10/08/24 1907)  iohexol  (OMNIPAQUE ) 350 MG/ML injection 75 mL (75 mLs Intravenous Contrast Given 10/08/24 1930)                                Medical Decision Making Amount and/or Complexity of Data  Reviewed Labs: ordered. Radiology: ordered.  Risk Prescription drug management.   53 year old female PMH as above presenting to the ED via EMS from UC with concern for generalized weakness and altered mental status in the setting of NBNB emesis, generalized abdominal pain, and subjective fevers x 1 day.    Upon arrival, patient SBP ~90 with normal sinus rhythm 80 bpm.  No tachypnea.  Saturating 98% on RA.  Afebrile.  GCS 15.  Nontoxic-appearing.  In no acute distress.  Patient with eyes closed during most of examination, however spontaneously opening eyes, moving all extremities with no appreciable focal neurologic deficits.  CN II through XII intact.  Normal finger-to-nose bilaterally.  Alert and oriented to person, place, time, situation.  Endorses medication compliance.  Patient clinically dehydrated with dry oromucosa and delayed capillary refill.  Notably given 100 cc from 500 cc NS bag in prehospital setting.  Patient with no history of HF, given 1000 cc bolus of LR.  EKG reviewed indicative of normal sinus rhythm 80 bpm.  QTc mildly prolonged, intervals otherwise unremarkable.  No T WI.  No acute ischemic changes appreciated.  Magnesium  1.6 -2 g IV replacement given.  CMP significant for AKI with creatinine of 1.62/BUN 35, elevated from baseline 0.78-0.90.  Lipase WNL.  Patient tolerating oral intake with successful p.o. challenge.  UA with bacteria and moderate leukocyte however patient denying any  acute urinary symptoms.  Doubt UTI/cystitis at this time.  CTAP with contrast indicative of right colonic and terminal ileum wall thickening and edema without evidence of obstruction, consistent with infectious or inflammatory colitis.  Nonobstructive stone in left kidney noted.  At this time, patient is hemodynamically stable and appropriate for discharge.  Tolerating p.o. intake.  Strict return precautions discussed.  Final diagnoses:  Gastroenteritis  Dehydration  Hypomagnesemia    ED Discharge Orders     None          Trevione Wert, Elsie, MD 10/08/24 LEANOR    Cleotilde Rogue, MD 10/10/24 1820

## 2024-10-08 NOTE — ED Triage Notes (Signed)
 Pt bib ems from [facility] medical; c/o AMS and hypotension; bp 100 palp; a an do x 3; c/o abd pain; hx dm, wearing dexcom; HR 82, cbg 260m rr 14

## 2024-10-08 NOTE — ED Notes (Signed)
 Poor venous access. Phlebotomist requested to collect.

## 2024-10-08 NOTE — ED Notes (Signed)
 Pt unable to provide urine sample at this time. Pt encouraged to call out when ready.

## 2024-10-08 NOTE — Discharge Instructions (Signed)
 You were evaluated in the emergency department for abdominal pain with nausea, vomiting, and dehydration.  You were treated with IV fluids.  Magnesium  levels were repleted.  You are tolerating p.o. intake and able to rehydrate orally.  A CT of your abdomen was consistent with findings of inflammatory colitis, likely viral in nature.

## 2024-10-15 ENCOUNTER — Other Ambulatory Visit: Payer: Self-pay

## 2024-10-15 ENCOUNTER — Inpatient Hospital Stay (HOSPITAL_COMMUNITY)
Admission: EM | Admit: 2024-10-15 | Discharge: 2024-10-18 | DRG: 684 | Disposition: A | Attending: Internal Medicine | Admitting: Internal Medicine

## 2024-10-15 ENCOUNTER — Emergency Department (HOSPITAL_COMMUNITY)

## 2024-10-15 ENCOUNTER — Encounter (HOSPITAL_COMMUNITY): Payer: Self-pay

## 2024-10-15 DIAGNOSIS — N179 Acute kidney failure, unspecified: Secondary | ICD-10-CM | POA: Diagnosis present

## 2024-10-15 DIAGNOSIS — K219 Gastro-esophageal reflux disease without esophagitis: Secondary | ICD-10-CM | POA: Diagnosis present

## 2024-10-15 DIAGNOSIS — G4733 Obstructive sleep apnea (adult) (pediatric): Secondary | ICD-10-CM | POA: Diagnosis present

## 2024-10-15 DIAGNOSIS — Z8249 Family history of ischemic heart disease and other diseases of the circulatory system: Secondary | ICD-10-CM

## 2024-10-15 DIAGNOSIS — E119 Type 2 diabetes mellitus without complications: Secondary | ICD-10-CM | POA: Diagnosis not present

## 2024-10-15 DIAGNOSIS — I10 Essential (primary) hypertension: Secondary | ICD-10-CM | POA: Diagnosis present

## 2024-10-15 DIAGNOSIS — Z885 Allergy status to narcotic agent status: Secondary | ICD-10-CM

## 2024-10-15 DIAGNOSIS — E860 Dehydration: Secondary | ICD-10-CM | POA: Diagnosis present

## 2024-10-15 DIAGNOSIS — E876 Hypokalemia: Secondary | ICD-10-CM | POA: Diagnosis present

## 2024-10-15 DIAGNOSIS — Z823 Family history of stroke: Secondary | ICD-10-CM

## 2024-10-15 DIAGNOSIS — Z882 Allergy status to sulfonamides status: Secondary | ICD-10-CM

## 2024-10-15 DIAGNOSIS — E1143 Type 2 diabetes mellitus with diabetic autonomic (poly)neuropathy: Secondary | ICD-10-CM | POA: Diagnosis present

## 2024-10-15 DIAGNOSIS — K5903 Drug induced constipation: Secondary | ICD-10-CM | POA: Diagnosis present

## 2024-10-15 DIAGNOSIS — Z79891 Long term (current) use of opiate analgesic: Secondary | ICD-10-CM

## 2024-10-15 DIAGNOSIS — D649 Anemia, unspecified: Secondary | ICD-10-CM | POA: Diagnosis present

## 2024-10-15 DIAGNOSIS — I951 Orthostatic hypotension: Secondary | ICD-10-CM | POA: Diagnosis present

## 2024-10-15 DIAGNOSIS — F319 Bipolar disorder, unspecified: Secondary | ICD-10-CM | POA: Diagnosis present

## 2024-10-15 DIAGNOSIS — Z6832 Body mass index (BMI) 32.0-32.9, adult: Secondary | ICD-10-CM | POA: Diagnosis not present

## 2024-10-15 DIAGNOSIS — Z8711 Personal history of peptic ulcer disease: Secondary | ICD-10-CM

## 2024-10-15 DIAGNOSIS — E785 Hyperlipidemia, unspecified: Secondary | ICD-10-CM | POA: Diagnosis present

## 2024-10-15 DIAGNOSIS — K3184 Gastroparesis: Secondary | ICD-10-CM | POA: Diagnosis present

## 2024-10-15 DIAGNOSIS — Z9641 Presence of insulin pump (external) (internal): Secondary | ICD-10-CM | POA: Diagnosis present

## 2024-10-15 DIAGNOSIS — E1165 Type 2 diabetes mellitus with hyperglycemia: Secondary | ICD-10-CM

## 2024-10-15 DIAGNOSIS — Z7982 Long term (current) use of aspirin: Secondary | ICD-10-CM | POA: Diagnosis not present

## 2024-10-15 DIAGNOSIS — Z794 Long term (current) use of insulin: Secondary | ICD-10-CM | POA: Diagnosis not present

## 2024-10-15 DIAGNOSIS — Z7985 Long-term (current) use of injectable non-insulin antidiabetic drugs: Secondary | ICD-10-CM | POA: Diagnosis not present

## 2024-10-15 DIAGNOSIS — Z818 Family history of other mental and behavioral disorders: Secondary | ICD-10-CM

## 2024-10-15 DIAGNOSIS — Z881 Allergy status to other antibiotic agents status: Secondary | ICD-10-CM

## 2024-10-15 DIAGNOSIS — Z888 Allergy status to other drugs, medicaments and biological substances status: Secondary | ICD-10-CM

## 2024-10-15 DIAGNOSIS — E66811 Obesity, class 1: Secondary | ICD-10-CM | POA: Diagnosis present

## 2024-10-15 DIAGNOSIS — J42 Unspecified chronic bronchitis: Secondary | ICD-10-CM | POA: Diagnosis present

## 2024-10-15 DIAGNOSIS — Z8673 Personal history of transient ischemic attack (TIA), and cerebral infarction without residual deficits: Secondary | ICD-10-CM

## 2024-10-15 DIAGNOSIS — Z87891 Personal history of nicotine dependence: Secondary | ICD-10-CM

## 2024-10-15 DIAGNOSIS — R112 Nausea with vomiting, unspecified: Secondary | ICD-10-CM | POA: Diagnosis not present

## 2024-10-15 DIAGNOSIS — T402X5A Adverse effect of other opioids, initial encounter: Secondary | ICD-10-CM | POA: Diagnosis present

## 2024-10-15 DIAGNOSIS — I2510 Atherosclerotic heart disease of native coronary artery without angina pectoris: Secondary | ICD-10-CM | POA: Diagnosis present

## 2024-10-15 DIAGNOSIS — E861 Hypovolemia: Secondary | ICD-10-CM | POA: Diagnosis present

## 2024-10-15 DIAGNOSIS — T383X5A Adverse effect of insulin and oral hypoglycemic [antidiabetic] drugs, initial encounter: Secondary | ICD-10-CM | POA: Diagnosis present

## 2024-10-15 DIAGNOSIS — Z79899 Other long term (current) drug therapy: Secondary | ICD-10-CM | POA: Diagnosis not present

## 2024-10-15 DIAGNOSIS — Z7951 Long term (current) use of inhaled steroids: Secondary | ICD-10-CM | POA: Diagnosis not present

## 2024-10-15 DIAGNOSIS — R7989 Other specified abnormal findings of blood chemistry: Secondary | ICD-10-CM | POA: Diagnosis present

## 2024-10-15 DIAGNOSIS — Z90710 Acquired absence of both cervix and uterus: Secondary | ICD-10-CM

## 2024-10-15 DIAGNOSIS — Z880 Allergy status to penicillin: Secondary | ICD-10-CM

## 2024-10-15 DIAGNOSIS — G8929 Other chronic pain: Secondary | ICD-10-CM | POA: Diagnosis present

## 2024-10-15 DIAGNOSIS — I252 Old myocardial infarction: Secondary | ICD-10-CM | POA: Diagnosis not present

## 2024-10-15 DIAGNOSIS — Z955 Presence of coronary angioplasty implant and graft: Secondary | ICD-10-CM

## 2024-10-15 LAB — ABO/RH: ABO/RH(D): O POS

## 2024-10-15 LAB — CBC WITH DIFFERENTIAL/PLATELET
Abs Immature Granulocytes: 0.05 K/uL (ref 0.00–0.07)
Basophils Absolute: 0 K/uL (ref 0.0–0.1)
Basophils Relative: 0 %
Eosinophils Absolute: 0.1 K/uL (ref 0.0–0.5)
Eosinophils Relative: 1 %
HCT: 40.4 % (ref 36.0–46.0)
Hemoglobin: 13 g/dL (ref 12.0–15.0)
Immature Granulocytes: 1 %
Lymphocytes Relative: 34 %
Lymphs Abs: 3.1 K/uL (ref 0.7–4.0)
MCH: 26.1 pg (ref 26.0–34.0)
MCHC: 32.2 g/dL (ref 30.0–36.0)
MCV: 81.1 fL (ref 80.0–100.0)
Monocytes Absolute: 0.8 K/uL (ref 0.1–1.0)
Monocytes Relative: 9 %
Neutro Abs: 5 K/uL (ref 1.7–7.7)
Neutrophils Relative %: 55 %
Platelets: 237 K/uL (ref 150–400)
RBC: 4.98 MIL/uL (ref 3.87–5.11)
RDW: 14.4 % (ref 11.5–15.5)
WBC: 9.1 K/uL (ref 4.0–10.5)
nRBC: 0 % (ref 0.0–0.2)

## 2024-10-15 LAB — TROPONIN T, HIGH SENSITIVITY
Troponin T High Sensitivity: 41 ng/L — ABNORMAL HIGH (ref 0–19)
Troponin T High Sensitivity: 32 ng/L — ABNORMAL HIGH (ref 0–19)

## 2024-10-15 LAB — TYPE AND SCREEN
ABO/RH(D): O POS
Antibody Screen: NEGATIVE

## 2024-10-15 LAB — PROTIME-INR
INR: 1.1 (ref 0.8–1.2)
Prothrombin Time: 14.8 s (ref 11.4–15.2)

## 2024-10-15 LAB — RESP PANEL BY RT-PCR (RSV, FLU A&B, COVID)  RVPGX2
Influenza A by PCR: NEGATIVE
Influenza B by PCR: NEGATIVE
Resp Syncytial Virus by PCR: NEGATIVE
SARS Coronavirus 2 by RT PCR: NEGATIVE

## 2024-10-15 LAB — I-STAT CG4 LACTIC ACID, ED
Lactic Acid, Venous: 2.1 mmol/L (ref 0.5–1.9)
Lactic Acid, Venous: 1.9 mmol/L (ref 0.5–1.9)

## 2024-10-15 LAB — COMPREHENSIVE METABOLIC PANEL WITH GFR
ALT: 11 U/L (ref 0–44)
AST: 18 U/L (ref 15–41)
Albumin: 3.5 g/dL (ref 3.5–5.0)
Alkaline Phosphatase: 87 U/L (ref 38–126)
Anion gap: 13 (ref 5–15)
BUN: 38 mg/dL — ABNORMAL HIGH (ref 6–20)
CO2: 24 mmol/L (ref 22–32)
Calcium: 8.8 mg/dL — ABNORMAL LOW (ref 8.9–10.3)
Chloride: 98 mmol/L (ref 98–111)
Creatinine, Ser: 2.24 mg/dL — ABNORMAL HIGH (ref 0.44–1.00)
GFR, Estimated: 26 mL/min — ABNORMAL LOW (ref >60)
Glucose, Bld: 172 mg/dL — ABNORMAL HIGH (ref 70–99)
Potassium: 3.2 mmol/L — ABNORMAL LOW (ref 3.5–5.1)
Sodium: 135 mmol/L (ref 135–145)
Total Bilirubin: 0.5 mg/dL (ref 0.0–1.2)
Total Protein: 6.1 g/dL — ABNORMAL LOW (ref 6.5–8.1)

## 2024-10-15 MED ORDER — BISACODYL 5 MG PO TBEC
5.0000 mg | DELAYED_RELEASE_TABLET | Freq: Every day | ORAL | Status: DC | PRN
Start: 2024-10-15 — End: 2024-10-18
  Filled 2024-10-15: qty 1

## 2024-10-15 MED ORDER — LURASIDONE HCL 40 MG PO TABS
80.0000 mg | ORAL_TABLET | Freq: Every evening | ORAL | Status: DC
  Administered 2024-10-16 – 2024-10-17 (×3): 80 mg via ORAL
  Filled 2024-10-15 (×5): qty 2

## 2024-10-15 MED ORDER — TOPIRAMATE 100 MG PO TABS
200.0000 mg | ORAL_TABLET | Freq: Every day | ORAL | Status: DC
  Administered 2024-10-16 – 2024-10-17 (×3): 200 mg via ORAL
  Filled 2024-10-15 (×3): qty 2

## 2024-10-15 MED ORDER — POTASSIUM CHLORIDE 10 MEQ/100ML IV SOLN
10.0000 meq | INTRAVENOUS | Status: AC
  Administered 2024-10-15 – 2024-10-16 (×3): 10 meq via INTRAVENOUS
  Filled 2024-10-15 (×3): qty 100

## 2024-10-15 MED ORDER — LACTATED RINGERS IV BOLUS (SEPSIS)
1000.0000 mL | Freq: Once | INTRAVENOUS | Status: AC
  Administered 2024-10-15: 1000 mL via INTRAVENOUS

## 2024-10-15 MED ORDER — ACETAMINOPHEN 325 MG PO TABS: 650.0000 mg | ORAL_TABLET | Freq: Four times a day (QID) | ORAL | Status: DC | PRN

## 2024-10-15 MED ORDER — LACTATED RINGERS IV BOLUS (SEPSIS)
500.0000 mL | Freq: Once | INTRAVENOUS | Status: AC
  Administered 2024-10-15: 500 mL via INTRAVENOUS

## 2024-10-15 MED ORDER — PANTOPRAZOLE SODIUM 40 MG PO TBEC
40.0000 mg | DELAYED_RELEASE_TABLET | Freq: Every day | ORAL | Status: DC
  Administered 2024-10-16 – 2024-10-18 (×3): 40 mg via ORAL
  Filled 2024-10-15 (×3): qty 1

## 2024-10-15 MED ORDER — BISACODYL 10 MG RE SUPP
10.0000 mg | Freq: Once | RECTAL | Status: AC
  Administered 2024-10-15: 10 mg via RECTAL
  Filled 2024-10-15: qty 1

## 2024-10-15 MED ORDER — ESCITALOPRAM OXALATE 10 MG PO TABS
5.0000 mg | ORAL_TABLET | Freq: Every morning | ORAL | Status: DC
  Administered 2024-10-16 – 2024-10-18 (×3): 5 mg via ORAL
  Filled 2024-10-15 (×3): qty 1

## 2024-10-15 MED ORDER — ONDANSETRON HCL 4 MG/2ML IJ SOLN: 4.0000 mg | Freq: Four times a day (QID) | INTRAMUSCULAR | Status: DC | PRN

## 2024-10-15 MED ORDER — INSULIN ASPART 100 UNIT/ML IJ SOLN
0.0000 [IU] | Freq: Three times a day (TID) | INTRAMUSCULAR | Status: DC
  Administered 2024-10-16: 2 [IU] via SUBCUTANEOUS
  Administered 2024-10-16 – 2024-10-17 (×5): 1 [IU] via SUBCUTANEOUS
  Administered 2024-10-18: 2 [IU] via SUBCUTANEOUS
  Administered 2024-10-18: 1 [IU] via SUBCUTANEOUS
  Filled 2024-10-15: qty 0.06

## 2024-10-15 MED ORDER — ACETAMINOPHEN 650 MG RE SUPP: 650.0000 mg | Freq: Four times a day (QID) | RECTAL | Status: DC | PRN

## 2024-10-15 MED ORDER — HEPARIN SODIUM (PORCINE) 5000 UNIT/ML IJ SOLN
5000.0000 [IU] | Freq: Three times a day (TID) | INTRAMUSCULAR | Status: DC
  Administered 2024-10-16 – 2024-10-18 (×6): 5000 [IU] via SUBCUTANEOUS
  Filled 2024-10-15 (×7): qty 1

## 2024-10-15 MED ORDER — LACTATED RINGERS IV SOLN: INTRAVENOUS | Status: AC

## 2024-10-15 MED ORDER — METRONIDAZOLE 500 MG/100ML IV SOLN
500.0000 mg | Freq: Once | INTRAVENOUS | Status: AC
  Administered 2024-10-15: 500 mg via INTRAVENOUS
  Filled 2024-10-15: qty 100

## 2024-10-15 MED ORDER — ONDANSETRON HCL 4 MG PO TABS: 4.0000 mg | ORAL_TABLET | Freq: Four times a day (QID) | ORAL | Status: DC | PRN

## 2024-10-15 MED ORDER — SENNA 8.6 MG PO TABS
1.0000 | ORAL_TABLET | Freq: Two times a day (BID) | ORAL | Status: DC
  Administered 2024-10-15 – 2024-10-18 (×4): 8.6 mg via ORAL
  Filled 2024-10-15 (×5): qty 1

## 2024-10-15 MED ORDER — SODIUM CHLORIDE 0.9 % IV SOLN
2.0000 g | Freq: Once | INTRAVENOUS | Status: AC
  Administered 2024-10-15: 2 g via INTRAVENOUS
  Filled 2024-10-15: qty 10

## 2024-10-15 NOTE — Sepsis Progress Note (Signed)
 eLink is following this Code Sepsis.

## 2024-10-15 NOTE — ED Notes (Signed)
 Excessive delay in blood work d/t a hard stick, and EMS line not pulling back.

## 2024-10-15 NOTE — Sepsis Progress Note (Signed)
 Confirmation with bedside Paramedic Lauren that an unsuccessful attempt was made on the blood cultures. Therefore, the antibiotic was hung to prevent further delay of care.

## 2024-10-15 NOTE — ED Notes (Signed)
 Unable to get 2nd blood culture. IV abt done at 1600.

## 2024-10-15 NOTE — ED Provider Notes (Signed)
 [place] EMERGENCY DEPARTMENT AT [facility] Provider Note   CSN: [id number] Arrival date & time: 10/15/24  1512     Patient presents with: Hypotension and Weakness   Stephanie Thornton is a 53 y.o. female.    Weakness    Patient has a history of bipolar disorder depression acid reflux hypertension diabetes, coronary artery disease, non-ST elevation MI, stroke.  Patient presents ED for evaluation of weakness nausea vomiting.  Patient states she started having symptoms over a week ago.  She was having some generalized abdominal discomfort weakness nausea and vomiting.  Patient has been complaining some chills and fevers.  Patient states she was seen in the emergency room on the 10th.  She had an evaluation including laboratory test and CT scan.  Patient was released with diagnosis of probable viral gastroenteritis.  Patient states she has not really gotten any better since that ED visit.  She continues to have episodes of vomiting several times per day.  She denies any diarrhea.  She is currently not having any abdominal pain.  She has not had any fevers.  She was feeling very weak and lightheaded today.  It was worse when she was standing.  Prior to Admission medications   Medication Sig Start Date End Date Taking? Authorizing Provider  Acetaminophen  (TYLENOL  PO) Take 2 tablets by mouth 3 (three) times daily as needed (tooth ache).    [provider]  amitriptyline  (ELAVIL ) 150 MG tablet Take 150 mg by mouth at bedtime. 08/06/22   [provider]  amLODipine  (NORVASC ) 10 MG tablet Take 1 tablet (10 mg total) by mouth daily. 09/23/22   Shlomo Wilbert SAUNDERS, MD  aspirin  81 MG chewable tablet Chew 1 tablet (81 mg total) by mouth daily. 05/09/22   Zhao, Xika, NP  atenolol  (TENORMIN ) 50 MG tablet Take 50 mg by mouth daily. 10/24/23   [provider]  baclofen  (LIORESAL ) 10 MG tablet Take 10 mg by mouth 3 (three) times daily. 05/05/22   [provider]   busPIRone  (BUSPAR ) 15 MG tablet Take 15 mg by mouth 3 (three) times daily. 07/10/24   [provider]  escitalopram  (LEXAPRO ) 5 MG tablet Take 5 mg by mouth every morning. 07/10/24   [provider]  gabapentin  (NEURONTIN ) 300 MG capsule Take 1 capsule (300 mg total) by mouth 3 (three) times daily. Patient not taking: Reported on 07/19/2024 11/11/22     HUMALOG 100 UNIT/ML injection 60 UNITS DAILY VIA INSULIN  PUMP INJECTION 30 DAYS    [provider]  hydrOXYzine  (ATARAX ) 25 MG tablet TAKE 1 TABLET BY ORAL ROUTE 3 TIMES PER DAY TAKE FOR ANXIETY, PANIC, AGITATION, AND SLEEP AS NEEDED    [provider]  Insulin  Pen Needle 31G X 5 MM MISC 1 Units by Does not apply route 4 (four) times daily. 09/07/23   Rojelio Nest, DO  LATUDA  80 MG TABS tablet Take 80 mg by mouth every evening. 02/01/19   [provider]  lidocaine  (LIDODERM ) 5 % Place 1 patch onto the skin daily. Remove & Discard patch within 12 hours or as directed by MD Patient not taking: Reported on 07/19/2024 12/31/23   Ruthe Cornet, DO  lisinopril -hydrochlorothiazide  (ZESTORETIC ) 20-12.5 MG tablet Take 1 tablet by mouth daily.    [provider]  MACROBID  100 MG capsule Take 100 mg by mouth 2 (two) times daily. 07/13/24   [provider]  nitroGLYCERIN  (NITROSTAT ) 0.4 MG SL tablet Place 1 tablet (0.4 mg total) under  the tongue every 5 (five) minutes x 3 doses as needed for chest pain. 05/09/22   Zhao, Xika, NP  Oxycodone  HCl 10 MG TABS Take 10 mg by mouth 4 (four) times daily as needed (pain). 07/08/22   [provider]  pantoprazole  (PROTONIX ) 40 MG tablet Take 40 mg by mouth daily. 02/19/24   [provider]  rosuvastatin  (CRESTOR ) 20 MG tablet Take 20 mg by mouth at bedtime. 07/29/23   [provider]  Semaglutide (OZEMPIC, 0.25 OR 0.5 MG/DOSE, [place]) Inject 0.25 mg into the skin once a week. Tuesdays    [provider]  SYMBICORT 160-4.5 MCG/ACT inhaler  SMARTSIG:2 inhalation Via Inhaler Twice Daily PRN 04/24/24   [provider]  topiramate  (TOPAMAX ) 200 MG tablet Take 200 mg by mouth at bedtime. 01/28/19   [provider]  traZODone  (DESYREL ) 100 MG tablet Take 250 mg by mouth at bedtime. 02/24/19   [provider]    Allergies: Lyrica [pregabalin], Rocephin  [ceftriaxone  sodium in dextrose ], Sulfa antibiotics, Ultram [tramadol], Vancomycin, Glucotrol  [glipizide ], Keflex  [cephalexin ], Metformin , Penicillins, and Janumet  [sitagliptin  phos-metformin  hcl]    Review of Systems  Neurological:  Positive for weakness.    Updated Vital Signs BP 114/74 (BP Location: Right Arm)   Pulse 85   Temp 98 F (36.7 C)   Resp 16   Ht 1.575 m (5' 2)   Wt 74.8 kg   SpO2 98%   BMI 30.18 kg/m   Physical Exam Vitals and nursing note reviewed.  Constitutional:      Appearance: She is well-developed. She is ill-appearing.  HENT:     Head: Normocephalic and atraumatic.     Right Ear: External ear normal.     Left Ear: External ear normal.     Mouth/Throat:     Mouth: Mucous membranes are dry.  Eyes:     General: No scleral icterus.       Right eye: No discharge.        Left eye: No discharge.     Conjunctiva/sclera: Conjunctivae normal.  Neck:     Trachea: No tracheal deviation.  Cardiovascular:     Rate and Rhythm: Normal rate and regular rhythm.  Pulmonary:     Effort: Pulmonary effort is normal. No respiratory distress.     Breath sounds: Normal breath sounds. No stridor. No wheezing or rales.  Abdominal:     General: Bowel sounds are normal. There is no distension.     Palpations: Abdomen is soft.     Tenderness: There is no abdominal tenderness. There is no guarding or rebound.  Musculoskeletal:        General: No tenderness or deformity.     Cervical back: Neck supple.  Skin:    General: Skin is warm and dry.     Findings: No rash.  Neurological:     General: No focal deficit present.     Mental Status:  She is alert.     Cranial Nerves: No cranial nerve deficit, dysarthria or facial asymmetry.     Sensory: No sensory deficit.     Motor: No abnormal muscle tone or seizure activity.     Coordination: Coordination normal.  Psychiatric:        Mood and Affect: Mood normal.     (all labs ordered are listed, but only abnormal results are displayed) Labs Reviewed  COMPREHENSIVE METABOLIC PANEL WITH GFR - Abnormal; Notable for the following components:      Result Value   Potassium  3.2 (*)    Glucose, Bld 172 (*)    BUN 38 (*)    Creatinine, Ser 2.24 (*)    Calcium  8.8 (*)    Total Protein 6.1 (*)    GFR, Estimated 26 (*)    All other components within normal limits  I-STAT CG4 LACTIC ACID, ED - Abnormal; Notable for the following components:   Lactic Acid, Venous 2.1 (*)    All other components within normal limits  TROPONIN T, HIGH SENSITIVITY - Abnormal; Notable for the following components:   Troponin T High Sensitivity 41 (*)    All other components within normal limits  TROPONIN T, HIGH SENSITIVITY - Abnormal; Notable for the following components:   Troponin T High Sensitivity 32 (*)    All other components within normal limits  RESP PANEL BY RT-PCR (RSV, FLU A&B, COVID)  RVPGX2  CULTURE, BLOOD (ROUTINE X 2)  CULTURE, BLOOD (ROUTINE X 2)  CBC WITH DIFFERENTIAL/PLATELET  PROTIME-INR  URINALYSIS, W/ REFLEX TO CULTURE (INFECTION SUSPECTED)  I-STAT CG4 LACTIC ACID, ED  TYPE AND SCREEN  ABO/RH    EKG: EKG Interpretation Date/Time:  Friday October 15 2024 16:14:30 EDT Ventricular Rate:  72 PR Interval:  165 QRS Duration:  96 QT Interval:  442 QTC Calculation: 484 R Axis:   -34  Text Interpretation: Sinus rhythm Consider right atrial enlargement Left axis deviation Abnormal T, consider ischemia, lateral leads No significant change since last tracing Confirmed by Randol Simmonds [phone]) on 10/15/2024 4:50:23 PM  Radiology: CT ABDOMEN PELVIS WO CONTRAST Result Date:  10/15/2024 EXAM: CT ABDOMEN AND PELVIS WITHOUT CONTRAST 10/15/2024 08:10:19 PM TECHNIQUE: CT of the abdomen and pelvis was performed without the administration of intravenous contrast. Multiplanar reformatted images are provided for review. Automated exposure control, iterative reconstruction, and/or weight-based adjustment of the mA/kV was utilized to reduce the radiation dose to as low as reasonably achievable. COMPARISON: CT abdomen 10/08/2024 and pelvis CT renal 07/18/2024. CLINICAL HISTORY: Abdominal pain, acute, nonlocalized. FINDINGS: LOWER CHEST: Bibasilar dependent atelectasis. LIVER: The liver is unremarkable. GALLBLADDER AND BILE DUCTS: Gallbladder is unremarkable. No biliary ductal dilatation. SPLEEN: No acute abnormality. PANCREAS: No acute abnormality. ADRENAL GLANDS: No acute abnormality. KIDNEYS, URETERS AND BLADDER: Left nephrolithiasis measuring up to 3 mm. Punctate calcifications noted within bilateral kidneys are likely vascular in etiology. No ureterolithiasis bilaterally. No hydroureteronephrosis bilaterally. Fluid dense lesion of the right kidney likely represents a simple renal cyst. Per consensus, no follow-up is needed for simple Bosniak type 1 and 2 renal cysts, unless the patient has a malignancy history or risk factors. Urinary bladder is unremarkable. GI AND BOWEL: Stomach demonstrates no acute abnormality. The appendix is unremarkable. No small or large bowel wall thickening or dilatation. Single descending colon diverticula (2:158). PERITONEUM AND RETROPERITONEUM: No ascites. No free air. VASCULATURE: Aorta is normal in caliber. Atherosclerotic plaque. LYMPH NODES: No lymphadenopathy. REPRODUCTIVE ORGANS: Status post hysterectomy. No adnexal mass. BONES AND SOFT TISSUES: Scattered densely sclerotic lesions within the pelvis consistent with bone islands. No acute osseous abnormality. No focal soft tissue abnormality. IMPRESSION: 1. No acute findings in the abdomen or pelvis. 2.  Nonobstructive 3 mm left nephrolithiasis. Electronically signed by: Morgane Naveau MD 10/15/2024 08:51 PM EDT RP Workstation: [id number]   DG Chest Port 1 View Result Date: 10/15/2024 EXAM: 1 VIEW XRAY OF THE CHEST 10/15/2024 04:10:00 PM COMPARISON: None available. CLINICAL HISTORY: Questionable sepsis - evaluate for abnormality. Pt c/o dizziness. FINDINGS: LUNGS AND PLEURA: No focal pulmonary opacity. No pulmonary edema. No pleural  effusion. No pneumothorax. HEART AND MEDIASTINUM: No acute abnormality of the cardiac and mediastinal silhouettes. BONES AND SOFT TISSUES: No acute osseous abnormality. IMPRESSION: 1. No acute cardiopulmonary findings. Electronically signed by: Shahmeer Marcelino MD 10/15/2024 04:42 PM EDT RP Workstation: [id number]     Procedures   Medications Ordered in the ED  lactated ringers  infusion ( Intravenous New Bag/Given 10/15/24 1745)  lactated ringers  bolus 1,000 mL (0 mLs Intravenous Stopped 10/15/24 1940)    And  lactated ringers  bolus 1,000 mL (0 mLs Intravenous Stopped 10/15/24 1909)    And  lactated ringers  bolus 500 mL (0 mLs Intravenous Stopped 10/15/24 1724)  aztreonam  (AZACTAM ) 2 g in sodium chloride  0.9 % 100 mL IVPB (0 g Intravenous Stopped 10/15/24 1631)  metroNIDAZOLE (FLAGYL) IVPB 500 mg (0 mg Intravenous Stopped 10/15/24 1940)    Clinical Course as of 10/15/24 2149  Fri Oct 15, 2024  1558 On exam patient's initial blood pressure was documented at 84/59.  No temperature was taken at this time.  Will treat for possible evolving sepsis.  Denies any GI bleeding symptoms but also possible consideration. [JK]  1737 BP improving now 114/74 [JK]  1910 I-Stat Lactic Acid, ED(!!) Increased  [JK]  1910 Comprehensive metabolic panel(!) Cr increased since last  [JK]  1910 CBC with Differential nl [JK]  2131 CT scan does not show any acute findings.  Incidental kidney stone noted [JK]  2148 Case discussed with Dr Arthea regarding admission [JK]     Clinical Course User Index [JK] Randol Simmonds, MD                                 Medical Decision Making Differential diagnosis includes but not limited to sepsis dehydration, electrolyte disturbance, bowel obstruction  Problems Addressed: AKI (acute kidney injury): acute illness or injury that poses a threat to life or bodily functions Dehydration: acute illness or injury that poses a threat to life or bodily functions  Amount and/or Complexity of Data Reviewed Labs: ordered. Decision-making details documented in ED Course. Radiology: ordered.  Risk Prescription drug management. Decision regarding hospitalization.   Patient presented to the ED with complaints of weakness persistent nausea and vomiting.  Recently seen in the emergency room diagnosed with possible colitis versus gastroenteritis.  Patient had persistent symptoms.  She began feeling very weak today.  On arrival blood pressure was 80/50.  Was concerned about the possibility of evolving sepsis although in the ED she does not have any leukocytosis no lactic acidosis.  Also proceeded with a CT scan of the abdomen pelvis and it does not show any signs of obstruction or of any acute abnormality.  Incidental kidney stone noted but no signs of hydronephrosis.  Urinalysis still currently pending but the rest of her evaluation does not show any evidence of pneumonia or other definite source of infection.  It is possible her hypotension earlier was related to dehydration and AKI.  Will continue with IV fluid treatment.  I will consult the medical service for admission further treatment     Final diagnoses:  AKI (acute kidney injury)  Dehydration    ED Discharge Orders     None          Randol Simmonds, MD 10/15/24 2149

## 2024-10-15 NOTE — H&P (Addendum)
 History and Physical    Patient: Stephanie Thornton FMW:[id number] DOB: 10/06/1971 DOA: 10/15/2024 DOS: the patient was seen and examined on 10/15/2024 PCP: Austin Mutton, MD  Patient coming from: Home  Chief Complaint:  Chief Complaint  Patient presents with   Hypotension   Weakness   HPI: Stephanie Thornton is a 53 y.o. female with medical history significant for type 2 diabetes mellitus, OSA on CPAP, coronary artery disease and MI, hypertension, GERD, history of CVA and bipolar disorder who presents because of worsening dizziness, now with falls. The patient was seen 10 days ago for similar symptoms she had persistent nausea occasional vomiting 0 appetite.  Her workup at that time did not reveal any major findings.  She was hydrated and discharged.  She reports that she has not gotten better since then if anything she is gotten worse.  She cannot keep any solid food or liquids down at all.  She denies abdominal pain or fevers or chills but the dizziness is so severe now.  She says she has not even urinated in 2 days.   The patient started Ozempic about 2 months ago.  She takes it on Tuesdays and last gave herself an injection 3 days ago.  She has longstanding constipation and says she has not had a bowel movement at all in about 2 weeks.  She is having hiccups. In the emergency department the patient had a CT scan of her abdomen and pelvis again just like 10 days ago but it did not show evidence of gastroenteritis or any acute findings.  She was hypotensive on arrival and initially she was put in the sepsis protocol.  She received 2-1/2 L of fluids and IV antibiotics but once her CT came back unremarkable and her labs revealed normal white blood cell count it was felt that infection was much lower on the differential.  It is felt that she is likely just profoundly dehydrated.  She will be admitted for continued IV fluids and monitoring.    Review of Systems: As mentioned in the history of present  illness. All other systems reviewed and are negative. Past Medical History:  Diagnosis Date   Anxiety    Arthritis    knees, feet, back (05/24/2015)   Benign essential HTN 05/01/2015   Bipolar disorder (HCC) diagnosed early 90s   CAD in native artery    Chronic bronchitis (HCC)    get it q yr   Chronic lower back pain    Depression    Diabetes mellitus type II 2010   GERD (gastroesophageal reflux disease)    Headache    maybe 3 times/wk (05/24/2015)   History of hiatal hernia    History of stomach ulcers    HLD (hyperlipidemia)    Menopause    Migraine    1-2/wk (05/24/2015)   NSTEMI (non-ST elevated myocardial infarction) (HCC) 04/2022   OSA on CPAP    Stroke (HCC)    r sided weakness   Past Surgical History:  Procedure Laterality Date   ABDOMINAL HYSTERECTOMY  2007   BUNIONECTOMY Bilateral    CARPAL TUNNEL RELEASE Right    CORONARY STENT INTERVENTION N/A 05/07/2022   Procedure: CORONARY STENT INTERVENTION;  Surgeon: Claudene Victory ORN, MD;  Location: [facility];  Service: Cardiovascular;  Laterality: N/A;   HAMMER TOE SURGERY Left    LEFT HEART CATH AND CORONARY ANGIOGRAPHY N/A 05/20/2018   Procedure: LEFT HEART CATH AND CORONARY ANGIOGRAPHY;  Surgeon: Anner Alm ORN, MD;  Location: [facility];  Service: Cardiovascular;  Laterality: N/A;   LEFT HEART CATH AND CORONARY ANGIOGRAPHY N/A 05/07/2022   Procedure: LEFT HEART CATH AND CORONARY ANGIOGRAPHY;  Surgeon: Claudene Victory ORN, MD;  Location: [facility];  Service: Cardiovascular;  Laterality: N/A;   TONSILLECTOMY Bilateral 05/24/2015   Procedure: TONSILLECTOMY;  Surgeon: Vaughan Ricker, MD;  Location: [facility];  Service: ENT;  Laterality: Bilateral;   Social History:  reports that she quit smoking about 2 years ago. Her smoking use included cigarettes. She started smoking about 32 years ago. She has a 30 pack-year smoking history. She has never used smokeless tobacco. She reports that she does not drink  alcohol and does not use drugs.  Allergies  Allergen Reactions   Lyrica [Pregabalin] Shortness Of Breath, Itching and Rash   Rocephin  [Ceftriaxone  Sodium In Dextrose ] Shortness Of Breath   Sulfa Antibiotics Hives, Shortness Of Breath and Itching   Ultram [Tramadol] Hives and Shortness Of Breath    Had asthma attack   Vancomycin Anaphylaxis    Per Dr. Precilla   Glucotrol  [Glipizide ] Hives   Keflex  [Cephalexin ] Hives   Metformin  Dermatitis   Penicillins Hives   Janumet  [Sitagliptin  Phos-Metformin  Hcl] Nausea Only    Family History  Problem Relation Age of Onset   Bipolar disorder Mother    CVA Mother    Bipolar disorder Son    Depression Brother        schizoaffective d/o   Hypertension Sister    Heart disease Maternal Aunt    Heart attack Maternal Aunt    Hypertension Brother     Prior to Admission medications   Medication Sig Start Date End Date Taking? Authorizing Provider  Acetaminophen  (TYLENOL  PO) Take 2 tablets by mouth 3 (three) times daily as needed (tooth ache).    [provider]  amitriptyline  (ELAVIL ) 150 MG tablet Take 150 mg by mouth at bedtime. 08/06/22   [provider]  amLODipine  (NORVASC ) 10 MG tablet Take 1 tablet (10 mg total) by mouth daily. 09/23/22   Shlomo Wilbert SAUNDERS, MD  aspirin  81 MG chewable tablet Chew 1 tablet (81 mg total) by mouth daily. 05/09/22   Zhao, Xika, NP  atenolol  (TENORMIN ) 50 MG tablet Take 50 mg by mouth daily. 10/24/23   [provider]  baclofen  (LIORESAL ) 10 MG tablet Take 10 mg by mouth 3 (three) times daily. 05/05/22   [provider]  busPIRone  (BUSPAR ) 15 MG tablet Take 15 mg by mouth 3 (three) times daily. 07/10/24   [provider]  escitalopram  (LEXAPRO ) 5 MG tablet Take 5 mg by mouth every morning. 07/10/24   [provider]  gabapentin  (NEURONTIN ) 300 MG capsule Take 1 capsule (300 mg total) by mouth 3 (three) times daily. Patient not taking: Reported on 07/19/2024 11/11/22      HUMALOG 100 UNIT/ML injection 60 UNITS DAILY VIA INSULIN  PUMP INJECTION 30 DAYS    [provider]  hydrOXYzine  (ATARAX ) 25 MG tablet TAKE 1 TABLET BY ORAL ROUTE 3 TIMES PER DAY TAKE FOR ANXIETY, PANIC, AGITATION, AND SLEEP AS NEEDED    [provider]  Insulin  Pen Needle 31G X 5 MM MISC 1 Units by Does not apply route 4 (four) times daily. 09/07/23   Rojelio Nest, DO  LATUDA  80 MG TABS tablet Take 80 mg by mouth every evening. 02/01/19   [provider]  lidocaine  (LIDODERM ) 5 % Place 1 patch onto the skin daily. Remove & Discard patch within  12 hours or as directed by MD Patient not taking: Reported on 07/19/2024 12/31/23   Ruthe Cornet, DO  lisinopril -hydrochlorothiazide  (ZESTORETIC ) 20-12.5 MG tablet Take 1 tablet by mouth daily.    [provider]  MACROBID  100 MG capsule Take 100 mg by mouth 2 (two) times daily. 07/13/24   [provider]  nitroGLYCERIN  (NITROSTAT ) 0.4 MG SL tablet Place 1 tablet (0.4 mg total) under the tongue every 5 (five) minutes x 3 doses as needed for chest pain. 05/09/22   Zhao, Xika, NP  Oxycodone  HCl 10 MG TABS Take 10 mg by mouth 4 (four) times daily as needed (pain). 07/08/22   [provider]  pantoprazole  (PROTONIX ) 40 MG tablet Take 40 mg by mouth daily. 02/19/24   [provider]  rosuvastatin  (CRESTOR ) 20 MG tablet Take 20 mg by mouth at bedtime. 07/29/23   [provider]  Semaglutide (OZEMPIC, 0.25 OR 0.5 MG/DOSE, [place] ) Inject 0.25 mg into the skin once a week. Tuesdays    [provider]  SYMBICORT 160-4.5 MCG/ACT inhaler SMARTSIG:2 inhalation Via Inhaler Twice Daily PRN 04/24/24   [provider]  topiramate  (TOPAMAX ) 200 MG tablet Take 200 mg by mouth at bedtime. 01/28/19   [provider]  traZODone  (DESYREL ) 100 MG tablet Take 250 mg by mouth at bedtime. 02/24/19   [provider]    Physical Exam: Vitals:   10/15/24 1536 10/15/24 1616 10/15/24 1748  10/15/24 1945  BP: (!) 84/59  114/74   Pulse: 70  85   Resp: 18  16   Temp:    98 F (36.7 C)  SpO2: 98%  98%   Weight:  74.8 kg    Height:  5' 2 (1.575 m)     Physical Exam:  General: No acute distress, well developed, well nourished HEENT: Normocephalic, atraumatic, PERRL Cardiovascular: Normal rate and rhythm. Distal pulses intact. Pulmonary: Normal pulmonary effort, normal breath sounds Gastrointestinal: Nondistended abdomen, soft, non-tender, normoactive bowel sounds Musculoskeletal:Normal ROM, no lower ext edema Skin: Skin is warm and dry. Neuro: No focal deficits noted, AAOx3. PSYCH: Attentive and cooperative  Data Reviewed:  Results for orders placed or performed during the hospital encounter of 10/15/24 (from the past 24 hours)  Comprehensive metabolic panel     Status: Abnormal   Collection Time: 10/15/24  5:38 PM  Result Value Ref Range   Sodium 135 135 - 145 mmol/L   Potassium 3.2 (L) 3.5 - 5.1 mmol/L   Chloride 98 98 - 111 mmol/L   CO2 24 22 - 32 mmol/L   Glucose, Bld 172 (H) 70 - 99 mg/dL   BUN 38 (H) 6 - 20 mg/dL   Creatinine, Ser 7.75 (H) 0.44 - 1.00 mg/dL   Calcium  8.8 (L) 8.9 - 10.3 mg/dL   Total Protein 6.1 (L) 6.5 - 8.1 g/dL   Albumin 3.5 3.5 - 5.0 g/dL   AST 18 15 - 41 U/L   ALT 11 0 - 44 U/L   Alkaline Phosphatase 87 38 - 126 U/L   Total Bilirubin 0.5 0.0 - 1.2 mg/dL   GFR, Estimated 26 (L) >60 mL/min   Anion gap 13 5 - 15  CBC with Differential     Status: None   Collection Time: 10/15/24  5:38 PM  Result Value Ref Range   WBC 9.1 4.0 - 10.5 K/uL   RBC 4.98 3.87 - 5.11 MIL/uL   Hemoglobin 13.0 12.0 - 15.0 g/dL   HCT 59.5 63.9 - 53.9 %  MCV 81.1 80.0 - 100.0 fL   MCH 26.1 26.0 - 34.0 pg   MCHC 32.2 30.0 - 36.0 g/dL   RDW 85.5 88.4 - 84.4 %   Platelets 237 150 - 400 K/uL   nRBC 0.0 0.0 - 0.2 %   Neutrophils Relative % 55 %   Neutro Abs 5.0 1.7 - 7.7 K/uL   Lymphocytes Relative 34 %   Lymphs Abs 3.1 0.7 - 4.0 K/uL   Monocytes Relative  9 %   Monocytes Absolute 0.8 0.1 - 1.0 K/uL   Eosinophils Relative 1 %   Eosinophils Absolute 0.1 0.0 - 0.5 K/uL   Basophils Relative 0 %   Basophils Absolute 0.0 0.0 - 0.1 K/uL   Immature Granulocytes 1 %   Abs Immature Granulocytes 0.05 0.00 - 0.07 K/uL  Protime-INR     Status: None   Collection Time: 10/15/24  5:38 PM  Result Value Ref Range   Prothrombin Time 14.8 11.4 - 15.2 seconds   INR 1.1 0.8 - 1.2  Blood Culture (routine x 2)     Status: None (Preliminary result)   Collection Time: 10/15/24  5:38 PM   Specimen: BLOOD LEFT WRIST  Result Value Ref Range   Specimen Description BLOOD LEFT WRIST    Special Requests      BOTTLES DRAWN AEROBIC AND ANAEROBIC Blood Culture adequate volume Performed at [facility], [address] [place], [place] [address]    Culture PENDING    Report Status PENDING   Troponin T, High Sensitivity     Status: Abnormal   Collection Time: 10/15/24  5:38 PM  Result Value Ref Range   Troponin T High Sensitivity 41 (H) 0 - 19 ng/L  ABO/Rh     Status: None   Collection Time: 10/15/24  5:38 PM  Result Value Ref Range   ABO/RH(D)      O POS Performed at [facility], [address], [place], [place] [address]   I-Stat Lactic Acid, ED     Status: Abnormal   Collection Time: 10/15/24  5:49 PM  Result Value Ref Range   Lactic Acid, Venous 2.1 (HH) 0.5 - 1.9 mmol/L   Comment NOTIFIED PHYSICIAN   Resp panel by RT-PCR (RSV, Flu A&B, Covid) Anterior Nasal Swab     Status: None   Collection Time: 10/15/24  6:36 PM   Specimen: Anterior Nasal Swab  Result Value Ref Range   SARS Coronavirus 2 by RT PCR NEGATIVE NEGATIVE   Influenza A by PCR NEGATIVE NEGATIVE   Influenza B by PCR NEGATIVE NEGATIVE   Resp Syncytial Virus by PCR NEGATIVE NEGATIVE  Type and screen [facility]     Status: None   Collection Time: 10/15/24  7:45 PM  Result Value Ref Range   ABO/RH(D) O POS    Antibody Screen NEG    Sample  Expiration      10/18/2024,2359 Performed at [facility], [address], [place], [place] [address]   I-Stat Lactic Acid, ED     Status: None   Collection Time: 10/15/24  7:52 PM  Result Value Ref Range   Lactic Acid, Venous 1.9 0.5 - 1.9 mmol/L  Troponin T, High Sensitivity     Status: Abnormal   Collection Time: 10/15/24  8:00 PM  Result Value Ref Range   Troponin T High Sensitivity 32 (H) 0 - 19 ng/L     Assessment and Plan: 1/ AKI, dehydration due to  persistent n/v, inability to keep anything down. - I suspect that all of this is due to Ozempic.  She does have constipation and says she has not had a bm in weeks.  CT does not show significant stool burden - Hold Ozempic - Dulcolax empirically - IV Fluids.  Monitor - No UA was collected prior to antibiotics - Sepsis was an initial concern but no clear infection has been identified so I will not continue antibiotics at this time.  2. DMT2 - consistent carb diet - Corrective dose insulin  until she is able to eat this regularly  3. Elevated troponin/ H/o MI -  - trending down.  Monitor.  She has had hiccups and lower chest tightness in the midst of so much vomiting.   4. Bipolar d/o - Continue routine meds  5. OSA - continue CPAP  5. Htn -the patient was hypotensive on arrival.  That has resolved with IV fluids.  Will hold her antihypertensives until her blood pressure is stable.   Advance Care Planning:   Code Status: Full Code the patient names her Sister Kenneth as her surrogate decision maker and she wants to be full code.  She has been DNR in the past but she said her kids had a problem with that so she is changing back to full code.  Consults: none  Family Communication: none  Severity of Illness: The appropriate patient status for this patient is INPATIENT. Inpatient status is judged to be reasonable and necessary in order to provide the required intensity of service to ensure the patient's safety.  The patient's presenting symptoms, physical exam findings, and initial radiographic and laboratory data in the context of their chronic comorbidities is felt to place them at high risk for further clinical deterioration. Furthermore, it is not anticipated that the patient will be medically stable for discharge from the hospital within 2 midnights of admission.   * I certify that at the point of admission it is my clinical judgment that the patient will require inpatient hospital care spanning beyond 2 midnights from the point of admission due to high intensity of service, high risk for further deterioration and high frequency of surveillance required.*  Author: ARTHEA CHILD, MD 10/15/2024 10:40 PM  For on call review [email or web address].

## 2024-10-15 NOTE — ED Triage Notes (Signed)
 BIB EMS pt reports weakness and low BP readings, frequent falls in past 5 days. Discharged from ED with Orthostatic Hypotension and Gastroenteritis. Pt c/o dizziness. Hx of stroke, MI, DM2

## 2024-10-15 NOTE — ED Triage Notes (Signed)
 BIB EMS from home. Seen recently for gastroenteritis.  Pt is extremely weak per family.  Hypotensive with EMS. 78/42.  Pt improved to 80s systolic.  No n/v/d at this time.

## 2024-10-16 DIAGNOSIS — N179 Acute kidney failure, unspecified: Secondary | ICD-10-CM | POA: Diagnosis not present

## 2024-10-16 LAB — CBC
HCT: 34.5 % — ABNORMAL LOW (ref 36.0–46.0)
Hemoglobin: 11.3 g/dL — ABNORMAL LOW (ref 12.0–15.0)
MCH: 26.7 pg (ref 26.0–34.0)
MCHC: 32.8 g/dL (ref 30.0–36.0)
MCV: 81.4 fL (ref 80.0–100.0)
Platelets: 193 K/uL (ref 150–400)
RBC: 4.24 MIL/uL (ref 3.87–5.11)
RDW: 14.4 % (ref 11.5–15.5)
WBC: 6.9 K/uL (ref 4.0–10.5)
nRBC: 0 % (ref 0.0–0.2)

## 2024-10-16 LAB — URINALYSIS, W/ REFLEX TO CULTURE (INFECTION SUSPECTED)
Bilirubin Urine: NEGATIVE
Glucose, UA: 50 mg/dL — AB
Hgb urine dipstick: NEGATIVE
Ketones, ur: NEGATIVE mg/dL
Nitrite: NEGATIVE
Protein, ur: NEGATIVE mg/dL
Specific Gravity, Urine: 1.01 (ref 1.005–1.030)
pH: 5 (ref 5.0–8.0)

## 2024-10-16 LAB — BASIC METABOLIC PANEL WITH GFR
Anion gap: 12 (ref 5–15)
BUN: 31 mg/dL — ABNORMAL HIGH (ref 6–20)
CO2: 22 mmol/L (ref 22–32)
Calcium: 8.3 mg/dL — ABNORMAL LOW (ref 8.9–10.3)
Chloride: 102 mmol/L (ref 98–111)
Creatinine, Ser: 1.54 mg/dL — ABNORMAL HIGH (ref 0.44–1.00)
GFR, Estimated: 40 mL/min — ABNORMAL LOW (ref >60)
Glucose, Bld: 230 mg/dL — ABNORMAL HIGH (ref 70–99)
Potassium: 3.1 mmol/L — ABNORMAL LOW (ref 3.5–5.1)
Sodium: 136 mmol/L (ref 135–145)

## 2024-10-16 LAB — CBG MONITORING, ED
Glucose-Capillary: 231 mg/dL — ABNORMAL HIGH (ref 70–99)
Glucose-Capillary: 203 mg/dL — ABNORMAL HIGH (ref 70–99)
Glucose-Capillary: 182 mg/dL — ABNORMAL HIGH (ref 70–99)

## 2024-10-16 LAB — HIV ANTIBODY (ROUTINE TESTING W REFLEX): HIV Screen 4th Generation wRfx: NONREACTIVE

## 2024-10-16 LAB — GLUCOSE, CAPILLARY
Glucose-Capillary: 165 mg/dL — ABNORMAL HIGH (ref 70–99)
Glucose-Capillary: 211 mg/dL — ABNORMAL HIGH (ref 70–99)

## 2024-10-16 LAB — MAGNESIUM: Magnesium: 1.6 mg/dL — ABNORMAL LOW (ref 1.7–2.4)

## 2024-10-16 MED ORDER — LACTULOSE 10 GM/15ML PO SOLN
20.0000 g | Freq: Every day | ORAL | Status: DC
  Administered 2024-10-16 – 2024-10-18 (×2): 20 g via ORAL
  Filled 2024-10-16 (×3): qty 30

## 2024-10-16 MED ORDER — INSULIN GLARGINE-YFGN 100 UNIT/ML ~~LOC~~ SOLN
5.0000 [IU] | Freq: Every day | SUBCUTANEOUS | Status: DC
  Administered 2024-10-16 – 2024-10-17 (×2): 5 [IU] via SUBCUTANEOUS
  Filled 2024-10-16 (×3): qty 0.05

## 2024-10-16 MED ORDER — MAGNESIUM SULFATE 2 GM/50ML IV SOLN
2.0000 g | Freq: Once | INTRAVENOUS | Status: AC
  Administered 2024-10-16: 2 g via INTRAVENOUS
  Filled 2024-10-16: qty 50

## 2024-10-16 MED ORDER — LACTATED RINGERS IV SOLN: INTRAVENOUS | Status: AC

## 2024-10-16 MED ORDER — POTASSIUM CHLORIDE 10 MEQ/100ML IV SOLN
10.0000 meq | INTRAVENOUS | Status: AC
  Administered 2024-10-16 (×4): 10 meq via INTRAVENOUS
  Filled 2024-10-16 (×4): qty 100

## 2024-10-16 MED ORDER — OXYCODONE HCL 5 MG PO TABS
10.0000 mg | ORAL_TABLET | Freq: Four times a day (QID) | ORAL | Status: DC | PRN
  Administered 2024-10-16 – 2024-10-18 (×3): 10 mg via ORAL
  Filled 2024-10-16 (×3): qty 2

## 2024-10-16 MED ORDER — POTASSIUM CHLORIDE 20 MEQ PO PACK
60.0000 meq | PACK | Freq: Once | ORAL | Status: DC
  Filled 2024-10-16: qty 3

## 2024-10-16 MED ORDER — POLYETHYLENE GLYCOL 3350 17 G PO PACK
17.0000 g | PACK | Freq: Every day | ORAL | Status: DC
  Administered 2024-10-16 – 2024-10-18 (×2): 17 g via ORAL
  Filled 2024-10-16 (×3): qty 1

## 2024-10-16 NOTE — Progress Notes (Signed)
 Progress Note   Patient: Stephanie Thornton FMW:[id number] DOB: March 01, 1971 DOA: 10/15/2024     1 DOS: the patient was seen and examined on 10/16/2024   Brief hospital course: HPI: Aiyla Baucom is a 53 y.o. female with medical history significant for type 2 diabetes mellitus, OSA on CPAP, coronary artery disease and MI, hypertension, GERD, history of CVA and bipolar disorder who presents because of worsening dizziness, now with falls. The patient was seen 10 days ago for similar symptoms she had persistent nausea occasional vomiting 0 appetite.  Her workup at that time did not reveal any major findings.  She was hydrated and discharged.  She reports that she has not gotten better since then if anything she is gotten worse.  She cannot keep any solid food or liquids down at all.  She denies abdominal pain or fevers or chills but the dizziness is so severe now.  She says she has not even urinated in 2 days.   The patient started Ozempic about 2 months ago.  She takes it on Tuesdays and last gave herself an injection 3 days ago.  She has longstanding constipation and says she has not had a bowel movement at all in about 2 weeks.  She is having hiccups. In the emergency department the patient had a CT scan of her abdomen and pelvis again just like 10 days ago but it did not show evidence of gastroenteritis or any acute findings.  She was hypotensive on arrival and initially she was put in the sepsis protocol.  She received 2-1/2 L of fluids and IV antibiotics but once her CT came back unremarkable and her labs revealed normal white blood cell count it was felt that infection was much lower on the differential.  It is felt that she is likely just profoundly dehydrated.  She will be admitted for continued IV fluids and monitoring.     Assessment and Plan:  AKI 2/2 hypovolemia Patient has recently had poor appetite with N/V. This resolved ~2d ago. Since that time she has been able to eat and drink but  has continued to be dizzy and falling.  Improved with IV fluids.   N/V Possibly related to ozempic. Also considered gastroparesis.  Will hold ozempic for now.    T2DM Poorly controlled A1c 9.9 3 months ago. On insulin  pump.  Has appointment with endocrine next week.   Orthostatic hypotension  Falls and dizziness Likely related to hypovolemia.  Hypokalemia GI losses Replace   Bipolar disorder  Continue Home latuda   H/o Stroke  Continue ASA   Chronic constipation IN the setting of opioid use. Aggressive bowel regimen   OSA  CPAP at bedtime   Chronic pain  On oxycodone  10mg  QID   Class 1 obesity   BMI 32. Would benefit from ongoing weight loss and lifestyle modifications.       Subjective: Patient states her last episode of emesis was about 2 d ago. She presented to the hospital because she was having difficulty ambulating.   Physical Exam: Vitals:   10/16/24 0804 10/16/24 0900 10/16/24 1224 10/16/24 1552  BP: 108/64 105/67 109/65   Pulse: 77 76 72   Resp: 16 18 18    Temp: (!) 97.3 F (36.3 C)  98 F (36.7 C)   TempSrc: Oral  Oral Oral  SpO2: 98% 98% 96%   Weight:      Height:       Physical Exam  Constitutional: In no distress.  Cardiovascular: Normal rate,  regular rhythm. No lower extremity edema  Pulmonary: Non labored breathing on room air, no wheezing or rales.   Abdominal: Soft. Non distended and non tender Musculoskeletal: Normal range of motion.     Neurological: Alert and oriented to person, place, and time. Non focal  Skin: Skin is warm and dry.   Data Reviewed:     Latest Ref Rng & Units 10/16/2024    5:00 AM 10/15/2024    5:38 PM 10/08/2024    4:58 PM  BMP  Glucose 70 - 99 mg/dL 769  827  849   BUN 6 - 20 mg/dL 31  38  35   Creatinine 0.44 - 1.00 mg/dL 8.45  7.75  8.37   Sodium 135 - 145 mmol/L 136  135  133   Potassium 3.5 - 5.1 mmol/L 3.1  3.2  3.9   Chloride 98 - 111 mmol/L 102  98  101   CO2 22 - 32 mmol/L 22  24  19     Calcium  8.9 - 10.3 mg/dL 8.3  8.8  8.7      Family Communication: Son at bedside, would like only her siblings upadated   Disposition: Status is: Inpatient Remains inpatient appropriate because: further w/u of falls  Planned Discharge Destination: Pending clinical course     Time spent: 35 minutes  Author: Alban Pepper, MD 10/16/2024 4:44 PM  For on call review [email or web address].

## 2024-10-16 NOTE — ED Notes (Signed)
 Pt unable to tolerate K+ at regular rate, slowed down to 65mg /hr, ice pack given and fluids running at 50ml/hr for infusion.

## 2024-10-16 NOTE — Progress Notes (Signed)
   10/16/24 0234  BiPAP/CPAP/SIPAP  $ Non-Invasive Home Ventilator  Initial  $ Face Mask Medium Yes  BiPAP/CPAP/SIPAP Pt Type Adult  BiPAP/CPAP/SIPAP Resmed  Mask Type Full face mask  Dentures removed? Not applicable  Mask Size Medium  Respiratory Rate 18 breaths/min  FiO2 (%) 21 %  Patient Home Machine No  Patient Home Mask No  Patient Home Tubing No  Auto Titrate Yes  Minimum cmH2O 5 cmH2O  Maximum cmH2O 20 cmH2O  CPAP/SIPAP surface wiped down Yes  Device Plugged into RED Power Outlet Yes  BiPAP/CPAP /SiPAP Vitals  Pulse Rate 76  Resp (!) 22  SpO2 99 %  MEWS Score/Color  MEWS Score 1  MEWS Score Color Green

## 2024-10-16 NOTE — Hospital Course (Signed)
 N/V last episode of emesis 2 days.  Been able to eat and dirnk sincd that time.    Dizziness and falling,  Orthostatic hypotension.   Omni pod  Has appointment  on Wednesday to see endocrinologist. A1c 9.9 3 months ago.    Told ckd does not see neprhologist

## 2024-10-16 NOTE — ED Notes (Signed)
 Pt only wants brother Denson Benders) and sister Gerianne Dupree) updated at this time.

## 2024-10-16 NOTE — Plan of Care (Incomplete)

## 2024-10-17 DIAGNOSIS — I951 Orthostatic hypotension: Secondary | ICD-10-CM | POA: Diagnosis not present

## 2024-10-17 DIAGNOSIS — N179 Acute kidney failure, unspecified: Secondary | ICD-10-CM | POA: Diagnosis not present

## 2024-10-17 LAB — CBC
HCT: 34.7 % — ABNORMAL LOW (ref 36.0–46.0)
Hemoglobin: 11.2 g/dL — ABNORMAL LOW (ref 12.0–15.0)
MCH: 26.2 pg (ref 26.0–34.0)
MCHC: 32.3 g/dL (ref 30.0–36.0)
MCV: 81.3 fL (ref 80.0–100.0)
Platelets: 165 K/uL (ref 150–400)
RBC: 4.27 MIL/uL (ref 3.87–5.11)
RDW: 14.2 % (ref 11.5–15.5)
WBC: 5.4 K/uL (ref 4.0–10.5)
nRBC: 0 % (ref 0.0–0.2)

## 2024-10-17 LAB — MAGNESIUM: Magnesium: 1.9 mg/dL (ref 1.7–2.4)

## 2024-10-17 LAB — BASIC METABOLIC PANEL WITH GFR
Anion gap: 9 (ref 5–15)
BUN: 16 mg/dL (ref 6–20)
CO2: 22 mmol/L (ref 22–32)
Calcium: 8.5 mg/dL — ABNORMAL LOW (ref 8.9–10.3)
Chloride: 105 mmol/L (ref 98–111)
Creatinine, Ser: 0.96 mg/dL (ref 0.44–1.00)
GFR, Estimated: >60 mL/min (ref >60)
Glucose, Bld: 158 mg/dL — ABNORMAL HIGH (ref 70–99)
Potassium: 3.5 mmol/L (ref 3.5–5.1)
Sodium: 135 mmol/L (ref 135–145)

## 2024-10-17 LAB — URINE CULTURE: Culture: <10000 — AB

## 2024-10-17 LAB — GLUCOSE, CAPILLARY
Glucose-Capillary: 182 mg/dL — ABNORMAL HIGH (ref 70–99)
Glucose-Capillary: 163 mg/dL — ABNORMAL HIGH (ref 70–99)
Glucose-Capillary: 171 mg/dL — ABNORMAL HIGH (ref 70–99)
Glucose-Capillary: 178 mg/dL — ABNORMAL HIGH (ref 70–99)

## 2024-10-17 MED ORDER — AMLODIPINE BESYLATE 5 MG PO TABS
5.0000 mg | ORAL_TABLET | Freq: Every day | ORAL | Status: DC
  Administered 2024-10-17 – 2024-10-18 (×2): 5 mg via ORAL
  Filled 2024-10-17 (×2): qty 1

## 2024-10-17 MED ORDER — POTASSIUM CHLORIDE 20 MEQ PO PACK
40.0000 meq | PACK | Freq: Once | ORAL | Status: AC
  Administered 2024-10-17: 40 meq via ORAL
  Filled 2024-10-17: qty 2

## 2024-10-17 MED ORDER — TRAZODONE HCL 100 MG PO TABS
250.0000 mg | ORAL_TABLET | Freq: Every day | ORAL | Status: DC
  Administered 2024-10-17: 250 mg via ORAL
  Filled 2024-10-17: qty 1

## 2024-10-17 MED ORDER — BUSPIRONE HCL 5 MG PO TABS
15.0000 mg | ORAL_TABLET | Freq: Three times a day (TID) | ORAL | Status: DC
  Administered 2024-10-17 – 2024-10-18 (×3): 15 mg via ORAL
  Filled 2024-10-17 (×3): qty 3

## 2024-10-17 MED ORDER — AMITRIPTYLINE HCL 25 MG PO TABS
150.0000 mg | ORAL_TABLET | Freq: Every day | ORAL | Status: DC
  Administered 2024-10-17: 150 mg via ORAL
  Filled 2024-10-17: qty 6

## 2024-10-17 MED ORDER — MAGNESIUM SULFATE 2 GM/50ML IV SOLN
2.0000 g | Freq: Once | INTRAVENOUS | Status: AC
  Administered 2024-10-17: 2 g via INTRAVENOUS
  Filled 2024-10-17: qty 50

## 2024-10-17 MED ORDER — ASPIRIN 81 MG PO CHEW
81.0000 mg | CHEWABLE_TABLET | Freq: Every day | ORAL | Status: DC
  Administered 2024-10-17 – 2024-10-18 (×2): 81 mg via ORAL
  Filled 2024-10-17 (×2): qty 1

## 2024-10-17 NOTE — Evaluation (Signed)
 Physical Therapy Evaluation Patient Details Name: Stephanie Thornton MRN: [id number] DOB: 1971/06/10 Today's Date: 10/17/2024  History of Present Illness  Pt is 53 yo female presented on 10/15/24 with worsening dizziness and now falls. Pt additionally with n/v and not tolerating food/liquid. Pt on ozempic.  Pt admitted with AKI secondary to hypovolemia. Pt with hx including but not limited to dm2, OSA, CAD, MI, HTN, GERD, CVA, bipolar disorder.  Clinical Impression  Pt admitted with above diagnosis. At baseline, pt is ambulatory without AD.  She does reports recent hx of increased falls and dizziness.  Noted orthostatic BP were + on 10/18 but now stabilized, pt was admitted with hypovolemia.  During evaluation, pt needing CGA for safety but was able to ambulate 300' without AD.  She did report additional hx of dizziness with turning head and rolling. Pt found to be + for L posterior canal BPPV and treated with Epley maneuver - tolerated well.  Will continue to benefit from PT services and outpt PT vestibular at d/c.   Pt currently with functional limitations due to the deficits listed below (see PT Problem List). Pt will benefit from acute skilled PT to increase their independence and safety with mobility to allow discharge.           If plan is discharge home, recommend the following: A little help with walking and/or transfers;A little help with bathing/dressing/bathroom;Assistance with cooking/housework;Help with stairs or ramp for entrance   Can travel by private vehicle        Equipment Recommendations Rolling walker (2 wheels)  Recommendations for Other Services       Functional Status Assessment Patient has had a recent decline in their functional status and demonstrates the ability to make significant improvements in function in a reasonable and predictable amount of time.     Precautions / Restrictions Precautions Precautions: Fall      Mobility  Bed Mobility Overal bed  mobility: Needs Assistance Bed Mobility: Supine to Sit, Sit to Supine     Supine to sit: Contact guard Sit to supine: Supervision        Transfers Overall transfer level: Needs assistance Equipment used: None Transfers: Sit to/from Stand Sit to Stand: Contact guard assist           General transfer comment: mild unsteadiness with hands in mid guard for balance initially; CGA for safety    Ambulation/Gait Ambulation/Gait assistance: Contact guard assist Gait Distance (Feet): 300 Feet Assistive device: None Gait Pattern/deviations: Step-through pattern Gait velocity: functional     General Gait Details: Ambulating in hallway without AD; initial unsteadinees with hands in mid guard but improved after ~10' ; no overt LOB  Stairs            Wheelchair Mobility     Tilt Bed    Modified Rankin (Stroke Patients Only)       Balance Overall balance assessment: Needs assistance Sitting-balance support: No upper extremity supported Sitting balance-Leahy Scale: Good     Standing balance support: No upper extremity supported Standing balance-Leahy Scale: Good                               Pertinent Vitals/Pain Pain Assessment Pain Assessment: No/denies pain    Home Living Family/patient expects to be discharged to:: Private residence Living Arrangements: Children Available Help at Discharge: Family;Available 24 hours/day Type of Home: House Home Access: Stairs to enter Entrance Stairs-Rails: Right Entrance Stairs-Number  of Steps: 4   Home Layout: One level Home Equipment: None      Prior Function Prior Level of Function : Independent/Modified Independent;Driving             Mobility Comments: Pt reports ambulatory in community without AD.  Reports 5 falls past 2 months with most of them the past 2 weeks. ADLs Comments: Independent with adls.  Family assisting wtih IADLs and medical management     Extremity/Trunk Assessment    Upper Extremity Assessment Upper Extremity Assessment: Overall WFL for tasks assessed    Lower Extremity Assessment Lower Extremity Assessment: Overall WFL for tasks assessed    Cervical / Trunk Assessment Cervical / Trunk Assessment: Normal  Communication        Cognition Arousal: Alert Behavior During Therapy: WFL for tasks assessed/performed   PT - Cognitive impairments: No apparent impairments                                 Cueing       General Comments  Pt reports dizziness over the past 2 weeks and increased falls.  Pt had a hard time describing the dizziness but did report spinning and lightheadedness at times.  States that it occurs when she stands and continues with walking, but also reports with rolling and turning head.  States that dizziness was occurring before falls.  Denies recent colds, virus, sinus , allergies.    Noted orthostatic taken with nursing were + on 10/18 but - on 10/19 after receiving fluids.    Objective: Resting or gaze inducated nystagmus: Negative Smooth pursuit: pt often blinking so difficult to assess at times, but appeared coordinated, did report dizziness with L gaze Gaze stabilization: moved slowly but intact Head Thrust: negative Test of Skew: slight lateral correction on L, but no horizontal correction [employer] R: negative [employer] L: initially negative but reports dizzy upon return to sitting.  Retested with loaded hall pike dix and + symptoms with upward rotational nystagmus lasting 2-3 beats.  Performed Epley maneuver.    Provided education on orthostatic hypotension management as well as BPPV and safety with compensation techniques.     Exercises     Assessment/Plan    PT Assessment Patient needs continued PT services  PT Problem List Decreased strength;Decreased mobility;Decreased range of motion;Decreased activity tolerance;Decreased balance;Decreased knowledge of use of DME;Pain;Decreased knowledge of  precautions       PT Treatment Interventions DME instruction;Therapeutic exercise;Gait training;Stair training;Functional mobility training;Therapeutic activities;Patient/family education;Canalith reposition;Neuromuscular re-education;Balance training    PT Goals (Current goals can be found in the Care Plan section)  Acute Rehab PT Goals Patient Stated Goal: return home PT Goal Formulation: With patient Time For Goal Achievement: 10/31/24 Potential to Achieve Goals: Good    Frequency Min 3X/week     Co-evaluation               AM-PAC PT 6 Clicks Mobility  Outcome Measure Help needed turning from your back to your side while in a flat bed without using bedrails?: None Help needed moving from lying on your back to sitting on the side of a flat bed without using bedrails?: None Help needed moving to and from a bed to a chair (including a wheelchair)?: A Little Help needed standing up from a chair using your arms (e.g., wheelchair or bedside chair)?: A Little Help needed to walk in hospital room?: A Little Help  needed climbing 3-5 steps with a railing? : A Little 6 Click Score: 20    End of Session Equipment Utilized During Treatment: Gait belt Activity Tolerance: Patient tolerated treatment well Patient left: in chair;with call bell/phone within reach;with chair alarm set   PT Visit Diagnosis: Other abnormalities of gait and mobility (R26.89);Repeated falls (R29.6);Dizziness and giddiness (R42)    Time: 8864-8797 PT Time Calculation (min) (ACUTE ONLY): 27 min   Charges:   PT Evaluation $PT Eval Moderate Complexity: 1 Mod PT Treatments $Canalith Rep Proc: 8-22 mins PT General Charges $$ ACUTE PT VISIT: 1 Visit         Benjiman, PT Acute Rehab Services [facility] [phone]   Benjiman VEAR Mulberry 10/17/2024, 12:12 PM

## 2024-10-17 NOTE — Progress Notes (Signed)
   10/17/24 2349  BiPAP/CPAP/SIPAP  BiPAP/CPAP/SIPAP Pt Type Adult  BiPAP/CPAP/SIPAP DREAMSTATIOND  Reason BIPAP/CPAP not in use Other(comment) (pt states she will let  us  know if she decides to wear tonight)  FiO2 (%) 21 %  BiPAP/CPAP /SiPAP Vitals  Resp (!) 22  MEWS Score/Color  MEWS Score 1  MEWS Score Color Landy

## 2024-10-17 NOTE — Inpatient Diabetes Management (Signed)
 Inpatient Diabetes Program Recommendations  AACE/ADA: New Consensus Statement on Inpatient Glycemic Control (2015)  Target Ranges:  Prepandial:   less than 140 mg/dL      Peak postprandial:   less than 180 mg/dL (1-2 hours)      Critically ill patients:  140 - 180 mg/dL   Lab Results  Component Value Date   GLUCAP 163 (H) 10/17/2024   HGBA1C 9.9 (H) 07/18/2024    Review of Glycemic Control  Diabetes history: DM type 2 Outpatient Diabetes medications: Omnipod insulin  pump with Humalog insulin  Current orders for Inpatient glycemic control:  Semglee  5 units Novolog  0-6 units tid  A1c 9.9% on 7/20 Endocrinologist Dr. Tommas, last known appointment was 7/9  Called pt to inquire about the insulin  pump she wears at home. Pt has assistance from her daughter in law and she will need to be contacted for details on her medications and past doctors appointments. Will try to contact daughter in law between today and tomorrow 10/20. Insulin  pump currently off pt. Daughter in law manages, Pt not appropriate for insulin  pump use while in the hospital.  Thanks,  Clotilda Bull RN, MSN, BC-ADM Inpatient Diabetes Coordinator Team Pager [phone] (8a-5p)

## 2024-10-17 NOTE — Progress Notes (Signed)
   10/17/24 0018  BiPAP/CPAP/SIPAP  $ Non-Invasive Home Ventilator  Subsequent  BiPAP/CPAP/SIPAP Pt Type Adult  BiPAP/CPAP/SIPAP DREAMSTATIOND  Mask Type Full face mask  Dentures removed? Not applicable  Mask Size Medium  FiO2 (%) 21 %  Patient Home Machine No  Patient Home Mask No  Patient Home Tubing No  Auto Titrate Yes  Minimum cmH2O 5 cmH2O  Maximum cmH2O 20 cmH2O  Device Plugged into RED Power Outlet Yes  BiPAP/CPAP /SiPAP Vitals  Resp 11  MEWS Score/Color  MEWS Score 1  MEWS Score Color Green

## 2024-10-17 NOTE — Progress Notes (Signed)
 Progress Note   Patient: Stephanie Thornton FMW:[id number] DOB: 1971/07/08 DOA: 10/15/2024     2 DOS: the patient was seen and examined on 10/17/2024   Brief hospital course: HPI: Stephanie Thornton is a 53 y.o. female with medical history significant for type 2 diabetes mellitus, OSA on CPAP, coronary artery disease and MI, hypertension, GERD, history of CVA and bipolar disorder who presents because of worsening dizziness, now with falls. The patient was seen 10 days ago for similar symptoms she had persistent nausea occasional vomiting 0 appetite.  Her workup at that time did not reveal any major findings.  She was hydrated and discharged.  She reports that she has not gotten better since then if anything she is gotten worse.  She cannot keep any solid food or liquids down at all.  She denies abdominal pain or fevers or chills but the dizziness is so severe now.  She says she has not even urinated in 2 days.   The patient started Ozempic about 2 months ago.  She takes it on Tuesdays and last gave herself an injection 3 days ago.  She has longstanding constipation and says she has not had a bowel movement at all in about 2 weeks.  She is having hiccups. In the emergency department the patient had a CT scan of her abdomen and pelvis again just like 10 days ago but it did not show evidence of gastroenteritis or any acute findings.  She was hypotensive on arrival and initially she was put in the sepsis protocol.  She received 2-1/2 L of fluids and IV antibiotics but once her CT came back unremarkable and her labs revealed normal white blood cell count it was felt that infection was much lower on the differential.  It is felt that she is likely just profoundly dehydrated.  She will be admitted for continued IV fluids and monitoring.     Assessment and Plan:  AKI 2/2 hypovolemia Continues to have some orthostasis. Serum creatinine normalized on AM labs   N/V Resolved  Possibly related to ozempic. Also  considered gastroparesis.  Will hold ozempic for now.    T2DM Poorly controlled A1c 9.9 3 months ago. On insulin  pump.  Has appointment with endocrine next week.   Orthostatic hypotension  Falls and dizziness Likely related to hypovolemia.  Hypokalemia GI losses Replace   Normocytic anemia  Stable.  F/u iron panel, b12, folate   Bipolar disorder  Continue Home latuda   H/o Stroke  Continue ASA   Chronic constipation IN the setting of opioid use. Aggressive bowel regimen   OSA  CPAP at bedtime   Chronic pain  On oxycodone  10mg  QID   Class 1 obesity   BMI 32. Would benefit from ongoing weight loss and lifestyle modifications.       Subjective: Remains symptomatic with standing.   Physical Exam: Vitals:   10/17/24 0018 10/17/24 0359 10/17/24 1341 10/17/24 1523  BP:  (!) 169/96 138/77   Pulse:  81 77   Resp: 11 15 (!) 23 19  Temp:  97.8 F (36.6 C) 98.6 F (37 C)   TempSrc:  Oral Oral   SpO2:  95% 100%   Weight:      Height:        Constitutional: In no distress.  Cardiovascular: Normal rate, regular rhythm. No lower extremity edema  Pulmonary: Non labored breathing on room air, no wheezing or rales.   Abdominal: Soft. Non distended and non tender Musculoskeletal: Normal range of  motion.     Neurological: Alert and oriented to person, place, and time. Non focal  Skin: Skin is warm and dry.    Data Reviewed:     Latest Ref Rng & Units 10/17/2024    8:24 AM 10/16/2024    5:00 AM 10/15/2024    5:38 PM  BMP  Glucose 70 - 99 mg/dL 841  769  827   BUN 6 - 20 mg/dL 16  31  38   Creatinine 0.44 - 1.00 mg/dL 9.03  8.45  7.75   Sodium 135 - 145 mmol/L 135  136  135   Potassium 3.5 - 5.1 mmol/L 3.5  3.1  3.2   Chloride 98 - 111 mmol/L 105  102  98   CO2 22 - 32 mmol/L 22  22  24    Calcium  8.9 - 10.3 mg/dL 8.5  8.3  8.8      Family Communication:None at bedside   Disposition: Status is: Inpatient Remains inpatient appropriate because: remains  symptomatic   Planned Discharge Destination: Pending clinical course     Time spent: 35 minutes  Author: Alban Pepper, MD 10/17/2024 6:53 PM  For on call review [email or web address].

## 2024-10-18 ENCOUNTER — Other Ambulatory Visit (HOSPITAL_COMMUNITY): Payer: Self-pay

## 2024-10-18 DIAGNOSIS — I951 Orthostatic hypotension: Secondary | ICD-10-CM | POA: Diagnosis not present

## 2024-10-18 DIAGNOSIS — N179 Acute kidney failure, unspecified: Secondary | ICD-10-CM | POA: Diagnosis not present

## 2024-10-18 LAB — BASIC METABOLIC PANEL WITH GFR
Anion gap: 11 (ref 5–15)
BUN: 11 mg/dL (ref 6–20)
CO2: 21 mmol/L — ABNORMAL LOW (ref 22–32)
Calcium: 9 mg/dL (ref 8.9–10.3)
Chloride: 105 mmol/L (ref 98–111)
Creatinine, Ser: 1.03 mg/dL — ABNORMAL HIGH (ref 0.44–1.00)
GFR, Estimated: >60 mL/min (ref >60)
Glucose, Bld: 229 mg/dL — ABNORMAL HIGH (ref 70–99)
Potassium: 4 mmol/L (ref 3.5–5.1)
Sodium: 136 mmol/L (ref 135–145)

## 2024-10-18 LAB — IRON AND TIBC
Iron: 77 ug/dL (ref 28–170)
Saturation Ratios: 30 % (ref 10.4–31.8)
TIBC: 256 ug/dL (ref 250–450)
UIBC: 180 ug/dL

## 2024-10-18 LAB — FOLATE: Folate: 8.7 ng/mL (ref >5.9)

## 2024-10-18 LAB — FERRITIN: Ferritin: 170 ng/mL (ref 11–307)

## 2024-10-18 LAB — GLUCOSE, CAPILLARY
Glucose-Capillary: 238 mg/dL — ABNORMAL HIGH (ref 70–99)
Glucose-Capillary: 176 mg/dL — ABNORMAL HIGH (ref 70–99)

## 2024-10-18 LAB — MAGNESIUM: Magnesium: 2 mg/dL (ref 1.7–2.4)

## 2024-10-18 LAB — VITAMIN B12: Vitamin B-12: 628 pg/mL (ref 180–914)

## 2024-10-18 MED ORDER — SENNA 8.6 MG PO TABS
1.0000 | ORAL_TABLET | Freq: Every day | ORAL | 0 refills | Status: AC
  Filled 2024-10-18: qty 120, 120d supply, fill #0

## 2024-10-18 MED ORDER — POLYETHYLENE GLYCOL 3350 17 G PO PACK: 17.0000 g | PACK | Freq: Every day | ORAL | Status: DC

## 2024-10-18 NOTE — Progress Notes (Signed)
 Physical Therapy Treatment Patient Details Name: Stephanie Thornton MRN: [id number] DOB: 02-20-1971 Today's Date: 10/18/2024   History of Present Illness Pt is 53 yo female presented on 10/15/24 with worsening dizziness and now falls. Pt additionally with n/v and not tolerating food/liquid. Pt on ozempic.  Pt admitted with AKI secondary to hypovolemia. Pt with hx including but not limited to dm2, OSA, CAD, MI, HTN, GERD, CVA, bipolar disorder.    PT Comments  Pt with goals met and education complete.  She reports all dizziness has resolved.  Pt ambulating in room independently and did 300' in hallway with PT safely.  Pt feels back to baseline.  No further PT needs will sign off.     If plan is discharge home, recommend the following:     Can travel by private vehicle        Equipment Recommendations  Rolling walker (2 wheels)    Recommendations for Other Services       Precautions / Restrictions Precautions Precautions: Fall     Mobility  Bed Mobility Overal bed mobility: Modified Independent                  Transfers Overall transfer level: Modified independent                 General transfer comment: Pt mobilizing in room on her own    Ambulation/Gait Ambulation/Gait assistance: Supervision Gait Distance (Feet): 300 Feet Assistive device: None Gait Pattern/deviations: Step-through pattern Gait velocity: functional     General Gait Details: Steady gait, no LOB   Stairs             Wheelchair Mobility     Tilt Bed    Modified Rankin (Stroke Patients Only)       Balance Overall balance assessment: Needs assistance Sitting-balance support: No upper extremity supported Sitting balance-Leahy Scale: Normal     Standing balance support: No upper extremity supported Standing balance-Leahy Scale: Good               High level balance activites: Direction changes, Turns, Head turns, Sudden stops (all without difficulty)               Communication    Cognition Arousal: Alert Behavior During Therapy: WFL for tasks assessed/performed   PT - Cognitive impairments: No apparent impairments                                Cueing    Exercises      General Comments General comments (skin integrity, edema, etc.): Pt reports all dizziness has resolved. Denies any dizziness with standing, head turns, laying flat, or rolling.  Pt has information on BPPV and self Epley - educated no need to perform since symptoms resolved.  Also, provided information on vestibular rehab if ever needed in future.      Pertinent Vitals/Pain Pain Assessment Pain Assessment: No/denies pain    Home Living                          Prior Function            PT Goals (current goals can now be found in the care plan section) Acute Rehab PT Goals PT Goal Formulation: All assessment and education complete, DC therapy Progress towards PT goals: Goals met/education completed, patient discharged from PT    Frequency    Min  3X/week      PT Plan      Co-evaluation              AM-PAC PT 6 Clicks Mobility   Outcome Measure  Help needed turning from your back to your side while in a flat bed without using bedrails?: None Help needed moving from lying on your back to sitting on the side of a flat bed without using bedrails?: None Help needed moving to and from a bed to a chair (including a wheelchair)?: None Help needed standing up from a chair using your arms (e.g., wheelchair or bedside chair)?: None Help needed to walk in hospital room?: None Help needed climbing 3-5 steps with a railing? : A Little 6 Click Score: 23    End of Session Equipment Utilized During Treatment: Gait belt Activity Tolerance: Patient tolerated treatment well Patient left: in bed;with bed alarm set;with family/visitor present Nurse Communication: Mobility status PT Visit Diagnosis: Other abnormalities of gait and  mobility (R26.89);Repeated falls (R29.6);Dizziness and giddiness (R42)     Time: 8964-8954 PT Time Calculation (min) (ACUTE ONLY): 10 min  Charges:    $Gait Training: 8-22 mins PT General Charges $$ ACUTE PT VISIT: 1 Visit                     Benjiman, PT Acute Rehab Services [facility] [phone]    Benjiman VEAR Mulberry 10/18/2024, 10:50 AM

## 2024-10-18 NOTE — Discharge Instructions (Signed)
 Please ensure you are hydrated.   Please get your kidney function checked with your primary care doctor in 1 week.   Please check with your primary doctor at that time to ensure you are on the right medicines for your blood pressure.

## 2024-10-18 NOTE — TOC Transition Note (Signed)
 Transition of Care [facility]) - Discharge Note   Patient Details  Name: Stephanie Thornton MRN: [id number] Date of Birth: May 10, 1971  [facility] CM/SW Contact:  Bascom Service, RN Phone Number: 10/18/2024, 12:04 PM   Clinical Narrative: PT recc rw-no preference-[employer] rep Jermaine to deliver rw to rm prior d/c.Has own transport home. No further CM needs.      Final next level of care: Home/Self Care Barriers to Discharge: No Barriers Identified   Patient Goals and CMS Choice Patient states their goals for this hospitalization and ongoing recovery are:: Home CMS Medicare.gov Compare Post Acute Care list provided to:: Patient Choice offered to / list presented to : Patient [place] ownership interest in [facility].provided to:: Patient    Discharge Placement                       Discharge Plan and Services Additional resources added to the After Visit Summary for     Discharge Planning Services: CM Consult            DME Arranged: Vannie rolling DME Agency: [employer] Date DME Agency Contacted: 10/18/24 Time DME Agency Contacted: 1203 Representative spoke with at DME Agency: [place]            Social Drivers of Health (SDOH) Interventions SDOH Screenings   Food Insecurity: No Food Insecurity (10/16/2024)  Housing: Low Risk  (10/16/2024)  Transportation Needs: No Transportation Needs (10/16/2024)  Utilities: Not At Risk (10/16/2024)  Depression (PHQ2-9): Medium Risk (05/28/2019)  Social Connections: Unknown (01/13/2023)   Received from [facility]  Tobacco Use: Medium Risk (10/15/2024)     Readmission Risk Interventions     No data to display

## 2024-10-18 NOTE — Plan of Care (Signed)

## 2024-10-18 NOTE — Inpatient Diabetes Management (Addendum)
 Inpatient Diabetes Program Recommendations  AACE/ADA: New Consensus Statement on Inpatient Glycemic Control (2015)  Target Ranges:  Prepandial:   less than 140 mg/dL      Peak postprandial:   less than 180 mg/dL (1-2 hours)      Critically ill patients:  140 - 180 mg/dL   Lab Results  Component Value Date   GLUCAP 238 (H) 10/18/2024   HGBA1C 9.9 (H) 07/18/2024    Review of Glycemic Control  Diabetes history: DM2 Outpatient Diabetes medications: Omnipod insulin  pump with Humalog insulin  Current orders for Inpatient glycemic control: Semglee  5 units at bedtime, Novolog  0-6 TID  HgbA1C - 9.9% Endo - Dr Bella   Inpatient Diabetes Program Recommendations:    Pt states she wears insulin  pump and sister-in-law manages it. Pt does not do any input to pump, only her sister-in-law, who she lives with. Did not fully understand why patient does not manage her pump independently. Would restart pump when pt goes home. Called and spoke with daughter-in-law who manages pump. Elijah states she does not know the pump settings. States she has to hold the bolus insulin  if blood sugars are low, especially when pt skips meals. Talked to pt in room about healthy eating, the Plate method, eating regular meals and increasing fiber in diet. Pt states she sees Endo approx once/month. Drinks juice, regular soda and we discussed good alternatives. 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  xplained how hyperglycemia leads to damage within blood vessels which lead to the common complications seen with uncontrolled diabetes. Stressed to the patient the importance of improving glycemic control to prevent further complications from uncontrolled diabetes. Discussed impact of nutrition, exercise, stress, sickness, and medications on diabetes control.   Pt is scheduled for discharge today. Restart insulin  pump around dinner today.  Thank you. Shona Brandy, RD, LDN,  CDCES Inpatient Diabetes Coordinator [phone]

## 2024-10-20 LAB — CULTURE, BLOOD (ROUTINE X 2)
Culture: NO GROWTH
Special Requests: ADEQUATE

## 2024-10-21 LAB — CULTURE, BLOOD (ROUTINE X 2): Culture: NO GROWTH

## 2024-10-21 NOTE — Discharge Summary (Addendum)
 Physician Discharge Summary   Patient: Stephanie Thornton MRN: [id number] DOB: 17-Jul-1971  Admit date:     10/15/2024  Discharge date: 10/18/2024  Discharge Physician: Alban Pepper   PCP: Austin Mutton, MD   Recommendations at discharge:    Need a repeat BMP outpatient.  Need to discuss if ozempic should be continued given n/v  F/u with pcp when to resume lisinopril /hydrochlorothiazide  pill   Discharge Diagnoses: Principal Problem:   AKI (acute kidney injury) Active Problems:   Coronary artery disease   OSA (obstructive sleep apnea)   Diabetes type 2, controlled (HCC)  Resolved Problems:   * No resolved hospital problems. *  Hospital Course: HPI: Stephanie Thornton is a 53 y.o. female with medical history significant for type 2 diabetes mellitus, OSA on CPAP, coronary artery disease and MI s/p PCI with stent (2023), hypertension, GERD, history of CVA and bipolar disorder who presents because of worsening dizziness, now with falls. The patient was seen 10 days ago for similar symptoms she had persistent nausea occasional vomiting 0 appetite.  Her workup at that time did not reveal any major findings.  She was hydrated and discharged.  She reports that she has not gotten better since then if anything she is gotten worse.  She cannot keep any solid food or liquids down at all.  She denies abdominal pain or fevers or chills but the dizziness is so severe now.  She says she has not even urinated in 2 days.   The patient started Ozempic about 2 months ago.  She takes it on Tuesdays and last gave herself an injection 3 days ago.  She has longstanding constipation and says she has not had a bowel movement at all in about 2 weeks.  She is having hiccups. In the emergency department the patient had a CT scan of her abdomen and pelvis again just like 10 days ago but it did not show evidence of gastroenteritis or any acute findings.  She was hypotensive on arrival and initially she was put in the  sepsis protocol.  She received 2-1/2 L of fluids and IV antibiotics but once her CT came back unremarkable and her labs revealed normal white blood cell count it was felt that infection was much lower on the differential.  It is felt that she is likely just profoundly dehydrated.  She will be admitted for continued IV fluids and monitoring.    Assessment and Plan: AKI 2/2 hypovolemia Improved with fluids.Serum creatinine was mildly elevated. On discharge. Encouraged to maintain hydration.     Latest Ref Rng & Units 10/18/2024    4:36 AM 10/17/2024    8:24 AM 10/16/2024    5:00 AM  BMP  Glucose 70 - 99 mg/dL 770  841  769   BUN 6 - 20 mg/dL 11  16  31    Creatinine 0.44 - 1.00 mg/dL 8.96  9.03  8.45   Sodium 135 - 145 mmol/L 136  135  136   Potassium 3.5 - 5.1 mmol/L 4.0  3.5  3.1   Chloride 98 - 111 mmol/L 105  105  102   CO2 22 - 32 mmol/L 21  22  22    Calcium  8.9 - 10.3 mg/dL 9.0  8.5  8.3     Orthostatic hypotension  Falls and dizziness likely related to orthostatic hypotension due to hypovolemia.  Her symptoms improved by discharge.   N/V Resolved  Possibly related to ozempic. Also considered gastroparesis.  Will hold ozempic for  now. Will need to discuss with PCP/endocrinologist if this should be continued.    H/o CAD s/p PCI w/ stent 2023 Elevation in troponin not clinically significant  Had some atypical symptoms on presentation, most likely GI related. Low suspicion for plaque rupture. Her symptoms resolved rapidly she had mildly elevated troponins with flat trend. Her EKG on admission had no significant changes prior to previous EKG to suggest acute ischemic event.   T2DM c/b neuropathy  Poorly controlled A1c 9.9 3 months ago. On insulin  pump.  Has appointment with endocrine next week.  Gabapentin  was paused in setting of AKI. Will discuss with outpatient team when to resume this. Amitriptyline  resumed.    HTN Patient initially with soft blood pressures. Home blood  pressures were held. Her blood pressures started to become elevated. Her home amlodipine  was resumed. Continued to hold her lisinopril /hydrochlorothiazide  pill.   Hypokalemia 2/2 GI losses Replaced while inpatient.    Normocytic anemia     Latest Ref Rng & Units 10/17/2024    8:24 AM 10/16/2024    5:00 AM 10/15/2024    5:38 PM  CBC  WBC 4.0 - 10.5 K/uL 5.4  6.9  9.1   Hemoglobin 12.0 - 15.0 g/dL 88.7  88.6  86.9   Hematocrit 36.0 - 46.0 % 34.7  34.5  40.4   Platelets 150 - 400 K/uL 165  193  237   Iron panel, b12, folate WNL.     Bipolar disorder  Continue Home latuda    H/o Stroke  Continue ASA, statin    Chronic constipation In the setting of chronic opioid use. Had a bowel movement with lactulose. Will discharge with senna and miralax .    OSA  CPAP at bedtime    Chronic pain  On oxycodone  10mg  QID    Class 1 obesity  BMI 32. Would benefit from ongoing weight loss and lifestyle modifications.         Consultants: None Procedures performed: None  Disposition: Home Diet recommendation:  Discharge Diet Orders (From admission, onward)     Start     Ordered   10/18/24 0000  Diet Carb Modified        10/18/24 1150           Carb modified diet DISCHARGE MEDICATION: Allergies as of 10/18/2024       Reactions   Lyrica [pregabalin] Shortness Of Breath, Itching, Rash   Rocephin  [ceftriaxone  Sodium In Dextrose ] Shortness Of Breath   Sulfa Antibiotics Hives, Shortness Of Breath, Itching   Ultram [tramadol] Hives, Shortness Of Breath   Had asthma attack   Vancomycin Anaphylaxis   Per Dr. Rezai   Glucotrol  [glipizide ] Hives   Keflex  [cephalexin ] Hives   Metformin  Dermatitis   Penicillins Hives   Janumet  [sitagliptin  Phos-metformin  Hcl] Nausea Only        Medication List     PAUSE taking these medications    gabapentin  300 MG capsule Wait to take this until your doctor or other care provider tells you to start again. Commonly known as:  NEURONTIN  Take 1 capsule (300 mg total) by mouth 3 (three) times daily.   lisinopril -hydrochlorothiazide  20-12.5 MG tablet Wait to take this until your doctor or other care provider tells you to start again. Commonly known as: ZESTORETIC  Take 1 tablet by mouth daily.       STOP taking these medications    Macrobid  100 MG capsule Generic drug: nitrofurantoin  (macrocrystal-monohydrate)       TAKE these medications  amitriptyline  150 MG tablet Commonly known as: ELAVIL  Take 150 mg by mouth at bedtime.   amLODipine  10 MG tablet Commonly known as: NORVASC  Take 1 tablet (10 mg total) by mouth daily.   aspirin  81 MG chewable tablet Chew 1 tablet (81 mg total) by mouth daily.   atenolol  50 MG tablet Commonly known as: TENORMIN  Take 50 mg by mouth daily.   baclofen  10 MG tablet Commonly known as: LIORESAL  Take 10 mg by mouth 3 (three) times daily.   busPIRone  15 MG tablet Commonly known as: BUSPAR  Take 15 mg by mouth 3 (three) times daily.   Dexcom G7 Sensor Misc SMARTSIG:1 Every 10 Days   escitalopram  5 MG tablet Commonly known as: LEXAPRO  Take 5 mg by mouth every morning.   HumaLOG 100 UNIT/ML injection Generic drug: insulin  lispro 60 UNITS DAILY VIA INSULIN  PUMP INJECTION 30 DAYS   hydrOXYzine  25 MG tablet Commonly known as: ATARAX  TAKE 1 TABLET BY ORAL ROUTE 3 TIMES PER DAY TAKE FOR ANXIETY, PANIC, AGITATION, AND SLEEP AS NEEDED   Insulin  Pen Needle 31G X 5 MM Misc 1 Units by Does not apply route 4 (four) times daily.   Latuda  80 MG Tabs tablet Generic drug: lurasidone  Take 80 mg by mouth every evening.   lidocaine  5 % Commonly known as: Lidoderm  Place 1 patch onto the skin daily. Remove & Discard patch within 12 hours or as directed by MD   nitroGLYCERIN  0.4 MG SL tablet Commonly known as: NITROSTAT  Place 1 tablet (0.4 mg total) under the tongue every 5 (five) minutes x 3 doses as needed for chest pain.   Omnipod 5 DexG7G6 Pods Gen 5 Misc 60  Units every 3 (three) days.   Oxycodone  HCl 10 MG Tabs Take 10 mg by mouth 4 (four) times daily as needed (pain).   pantoprazole  40 MG tablet Commonly known as: PROTONIX  Take 40 mg by mouth daily.   polyethylene glycol 17 g packet Commonly known as: MIRALAX  / GLYCOLAX  Take 17 g by mouth daily.   rosuvastatin  20 MG tablet Commonly known as: CRESTOR  Take 20 mg by mouth at bedtime.   senna 8.6 MG Tabs tablet Commonly known as: SENOKOT Take 1 tablet (8.6 mg total) by mouth at bedtime.   Symbicort 160-4.5 MCG/ACT inhaler Generic drug: budesonide-formoterol SMARTSIG:2 inhalation Via Inhaler Twice Daily PRN   topiramate  200 MG tablet Commonly known as: TOPAMAX  Take 200 mg by mouth at bedtime.   traZODone  100 MG tablet Commonly known as: DESYREL  Take 250 mg by mouth at bedtime.   TYLENOL  PO Take 2 tablets by mouth 3 (three) times daily as needed (tooth ache).        Follow-up Information     Austin Mutton, MD. Schedule an appointment as soon as possible for a visit in 1 week(s).   Specialty: Internal Medicine Contact information: [address] [place] [address] [phone]         [facility] Follow up.   Why: rolling walker Contact information: [address] [place] [phone] [phone]               Discharge Exam: Filed Weights   10/15/24 1616  Weight: 74.8 kg   Physical Exam  Constitutional: In no distress.  Cardiovascular: Normal rate, regular rhythm. No lower extremity edema  Pulmonary: Non labored breathing on room air, no wheezing or rales.   Abdominal: Soft. Non distended and non tender Musculoskeletal: Normal range of motion.     Neurological: Alert and oriented to  person, place, and time. Non focal  Skin: Skin is warm and dry.    Condition at discharge: improving  The results of significant diagnostics from this hospitalization (including imaging, microbiology, ancillary and laboratory) are listed below for reference.   Imaging  Studies: CT ABDOMEN PELVIS WO CONTRAST Result Date: 10/15/2024 EXAM: CT ABDOMEN AND PELVIS WITHOUT CONTRAST 10/15/2024 08:10:19 PM TECHNIQUE: CT of the abdomen and pelvis was performed without the administration of intravenous contrast. Multiplanar reformatted images are provided for review. Automated exposure control, iterative reconstruction, and/or weight-based adjustment of the mA/kV was utilized to reduce the radiation dose to as low as reasonably achievable. COMPARISON: CT abdomen 10/08/2024 and pelvis CT renal 07/18/2024. CLINICAL HISTORY: Abdominal pain, acute, nonlocalized. FINDINGS: LOWER CHEST: Bibasilar dependent atelectasis. LIVER: The liver is unremarkable. GALLBLADDER AND BILE DUCTS: Gallbladder is unremarkable. No biliary ductal dilatation. SPLEEN: No acute abnormality. PANCREAS: No acute abnormality. ADRENAL GLANDS: No acute abnormality. KIDNEYS, URETERS AND BLADDER: Left nephrolithiasis measuring up to 3 mm. Punctate calcifications noted within bilateral kidneys are likely vascular in etiology. No ureterolithiasis bilaterally. No hydroureteronephrosis bilaterally. Fluid dense lesion of the right kidney likely represents a simple renal cyst. Per consensus, no follow-up is needed for simple Bosniak type 1 and 2 renal cysts, unless the patient has a malignancy history or risk factors. Urinary bladder is unremarkable. GI AND BOWEL: Stomach demonstrates no acute abnormality. The appendix is unremarkable. No small or large bowel wall thickening or dilatation. Single descending colon diverticula (2:158). PERITONEUM AND RETROPERITONEUM: No ascites. No free air. VASCULATURE: Aorta is normal in caliber. Atherosclerotic plaque. LYMPH NODES: No lymphadenopathy. REPRODUCTIVE ORGANS: Status post hysterectomy. No adnexal mass. BONES AND SOFT TISSUES: Scattered densely sclerotic lesions within the pelvis consistent with bone islands. No acute osseous abnormality. No focal soft tissue abnormality. IMPRESSION: 1.  No acute findings in the abdomen or pelvis. 2. Nonobstructive 3 mm left nephrolithiasis. Electronically signed by: Morgane Naveau MD 10/15/2024 08:51 PM EDT RP Workstation: [id number]   DG Chest Port 1 View Result Date: 10/15/2024 EXAM: 1 VIEW XRAY OF THE CHEST 10/15/2024 04:10:00 PM COMPARISON: None available. CLINICAL HISTORY: Questionable sepsis - evaluate for abnormality. Pt c/o dizziness. FINDINGS: LUNGS AND PLEURA: No focal pulmonary opacity. No pulmonary edema. No pleural effusion. No pneumothorax. HEART AND MEDIASTINUM: No acute abnormality of the cardiac and mediastinal silhouettes. BONES AND SOFT TISSUES: No acute osseous abnormality. IMPRESSION: 1. No acute cardiopulmonary findings. Electronically signed by: Harrietta Sherry MD 10/15/2024 04:42 PM EDT RP Workstation: [id number]   CT ABDOMEN PELVIS W CONTRAST Result Date: 10/08/2024 CLINICAL DATA:  Abdominal pain. Upper abdominal pain with nausea and vomiting. EXAM: CT ABDOMEN AND PELVIS WITH CONTRAST TECHNIQUE: Multidetector CT imaging of the abdomen and pelvis was performed using the standard protocol following bolus administration of intravenous contrast. RADIATION DOSE REDUCTION: This exam was performed according to the departmental dose-optimization program which includes automated exposure control, adjustment of the mA and/or kV according to patient size and/or use of iterative reconstruction technique. CONTRAST:  75mL OMNIPAQUE  IOHEXOL  350 MG/ML SOLN COMPARISON:  07/18/2024 FINDINGS: Lower chest: Somewhat linear infiltrates in the lung bases probably representing atelectasis. Cardiac enlargement. Hepatobiliary: No focal liver abnormality is seen. No gallstones, gallbladder wall thickening, or biliary dilatation. Pancreas: Unremarkable. No pancreatic ductal dilatation or surrounding inflammatory changes. Spleen: Normal in size without focal abnormality. Adrenals/Urinary Tract: No adrenal gland nodules. 5 mm stone in the upper pole left kidney.  Nephrograms are homogeneous and symmetrical. No hydronephrosis or hydroureter. Bladder is decompressed. Stomach/Bowel: Stomach, small bowel,  and colon are not abnormally distended. There is wall thickening and edema involving the cecum and ascending colon, extending to the hepatic flexure. Wall thickening is also suggested in the terminal ileum although underdistention limits evaluation. This is likely to represent infectious or inflammatory colitis. Scattered stool throughout the colon. No loculated collections. Appendix is normal. Vascular/Lymphatic: Aortic atherosclerosis. No enlarged abdominal or pelvic lymph nodes. Reproductive: Status post hysterectomy. No adnexal masses. Other: No abdominal wall hernia or abnormality. No abdominopelvic ascites. Musculoskeletal: No acute or significant osseous findings. IMPRESSION: 1. Right colonic and terminal ileum wall thickening and edema without evidence of obstruction. This most likely represents infectious or inflammatory colitis. 2. Nonobstructing stone in the left kidney. 3. Aortic atherosclerosis. Electronically Signed   By: Elsie Gravely M.D.   On: 10/08/2024 19:51   DG Chest Portable 1 View Result Date: 10/08/2024 CLINICAL DATA:  Hyperglycemia. EXAM: PORTABLE CHEST 1 VIEW COMPARISON:  July 18, 2024 FINDINGS: The heart size and mediastinal contours are within normal limits. Mild atelectasis is noted within the right lung base. No acute infiltrate, pleural effusion or pneumothorax is identified. The visualized skeletal structures are unremarkable. IMPRESSION: No acute cardiopulmonary disease. Electronically Signed   By: Suzen Dials M.D.   On: 10/08/2024 18:10    Microbiology: Results for orders placed or performed during the hospital encounter of 10/15/24  Blood Culture (routine x 2)     Status: None   Collection Time: 10/15/24  5:38 PM   Specimen: BLOOD LEFT WRIST  Result Value Ref Range Status   Specimen Description BLOOD LEFT WRIST  Final    Special Requests   Final    BOTTLES DRAWN AEROBIC AND ANAEROBIC Blood Culture adequate volume   Culture   Final    NO GROWTH 5 DAYS Performed at [facility], [address] [place], [place] [address]    Report Status 10/20/2024 FINAL  Final  Resp panel by RT-PCR (RSV, Flu A&B, Covid) Anterior Nasal Swab     Status: None   Collection Time: 10/15/24  6:36 PM   Specimen: Anterior Nasal Swab  Result Value Ref Range Status   SARS Coronavirus 2 by RT PCR NEGATIVE NEGATIVE Final    Comment: (NOTE) SARS-CoV-2 target nucleic acids are NOT DETECTED.  The SARS-CoV-2 RNA is generally detectable in upper respiratory specimens during the acute phase of infection. The lowest concentration of SARS-CoV-2 viral copies this assay can detect is 138 copies/mL. A negative result does not preclude SARS-Cov-2 infection and should not be used as the sole basis for treatment or other patient management decisions. A negative result may occur with  improper specimen collection/handling, submission of specimen other than nasopharyngeal swab, presence of viral mutation(s) within the areas targeted by this assay, and inadequate number of viral copies(<138 copies/mL). A negative result must be combined with clinical observations, patient history, and epidemiological information. The expected result is Negative.  Fact Sheet for Patients:  bloggercourse.com  Fact Sheet for Healthcare Providers:  seriousbroker.it  This test is no t yet approved or cleared by the [place]  FDA and  has been authorized for detection and/or diagnosis of SARS-CoV-2 by FDA under an Emergency Use Authorization (EUA). This EUA will remain  in effect (meaning this test can be used) for the duration of the COVID-19 declaration under Section 564(b)(1) of the Act, 21 U.S.C.section 360bbb-3(b)(1), unless the authorization is terminated  or revoked sooner.       Influenza A by  PCR NEGATIVE NEGATIVE Final  Influenza B by PCR NEGATIVE NEGATIVE Final    Comment: (NOTE) The Xpert Xpress SARS-CoV-2/FLU/RSV plus assay is intended as an aid in the diagnosis of influenza from Nasopharyngeal swab specimens and should not be used as a sole basis for treatment. Nasal washings and aspirates are unacceptable for Xpert Xpress SARS-CoV-2/FLU/RSV testing.  Fact Sheet for Patients: bloggercourse.com  Fact Sheet for Healthcare Providers: seriousbroker.it  This test is not yet approved or cleared by the [place]  FDA and has been authorized for detection and/or diagnosis of SARS-CoV-2 by FDA under an Emergency Use Authorization (EUA). This EUA will remain in effect (meaning this test can be used) for the duration of the COVID-19 declaration under Section 564(b)(1) of the Act, 21 U.S.C. section 360bbb-3(b)(1), unless the authorization is terminated or revoked.     Resp Syncytial Virus by PCR NEGATIVE NEGATIVE Final    Comment: (NOTE) Fact Sheet for Patients: bloggercourse.com  Fact Sheet for Healthcare Providers: seriousbroker.it  This test is not yet approved or cleared by the [place]  FDA and has been authorized for detection and/or diagnosis of SARS-CoV-2 by FDA under an Emergency Use Authorization (EUA). This EUA will remain in effect (meaning this test can be used) for the duration of the COVID-19 declaration under Section 564(b)(1) of the Act, 21 U.S.C. section 360bbb-3(b)(1), unless the authorization is terminated or revoked.  Performed at [facility], [address], [place], [place] [address]   Urine Culture     Status: Abnormal   Collection Time: 10/16/24 12:43 AM   Specimen: Urine, Random  Result Value Ref Range Status   Specimen Description   Final    URINE, RANDOM Performed at [facility], [address], [place], [place] [address]    Special Requests   Final    NONE Reflexed from [id number] Performed at [facility], [address] [place], [place] [address]    Culture (A)  Final    <10,000 COLONIES/mL INSIGNIFICANT GROWTH Performed at [facility], [address] [place], [place] [address]    Report Status 10/17/2024 FINAL  Final  Blood Culture (routine x 2)     Status: None   Collection Time: 10/16/24  3:57 PM   Specimen: BLOOD RIGHT HAND  Result Value Ref Range Status   Specimen Description   Final    BLOOD RIGHT HAND BOTTLES DRAWN AEROBIC ONLY Performed at [facility], [address], [place], [place] [address]    Special Requests   Final    Blood Culture results may not be optimal due to an inadequate volume of blood received in culture bottles Performed at [facility], [address] [place], [place] [address]    Culture   Final    NO GROWTH 5 DAYS Performed at [facility], [address], [place], [place] [address]    Report Status 10/21/2024 FINAL  Final    Labs: CBC: Recent Labs  Lab 10/15/24 1738 10/16/24 0500 10/17/24 0824  WBC 9.1 6.9 5.4  NEUTROABS 5.0  --   --   HGB 13.0 11.3* 11.2*  HCT 40.4 34.5* 34.7*  MCV 81.1 81.4 81.3  PLT 237 193 165   Basic Metabolic Panel: Recent Labs  Lab 10/15/24 1738 10/16/24 0500 10/17/24 0824 10/18/24 0436  NA 135 136 135 136  K 3.2* 3.1* 3.5 4.0  CL 98 102 105 105  CO2 24 22 22  21*  GLUCOSE 172* 230* 158* 229*  BUN 38* 31* 16 11  CREATININE 2.24* 1.54* 0.96 1.03*  CALCIUM  8.8* 8.3* 8.5* 9.0  MG  --  1.6* 1.9 2.0   Liver Function Tests: Recent Labs  Lab 10/15/24 1738  AST 18  ALT 11  ALKPHOS 87  BILITOT 0.5  PROT 6.1*  ALBUMIN 3.5   CBG: Recent Labs  Lab 10/17/24 1140 10/17/24 1709 10/17/24 2059 10/18/24 0740 10/18/24 1128  GLUCAP 163* 171* 178* 238* 176*    Discharge time spent: greater than 30  minutes.  Signed: Alban Pepper, MD [facility] 10/21/2024

## 2024-10-23 ENCOUNTER — Encounter (HOSPITAL_COMMUNITY): Payer: Self-pay | Admitting: Emergency Medicine

## 2024-10-23 ENCOUNTER — Other Ambulatory Visit: Payer: Self-pay

## 2024-10-23 ENCOUNTER — Inpatient Hospital Stay (HOSPITAL_COMMUNITY)
Admission: EM | Admit: 2024-10-23 | Discharge: 2024-10-27 | DRG: 683 | Disposition: A | Discharge status: Home / Self Care | Attending: Internal Medicine | Admitting: Internal Medicine

## 2024-10-23 DIAGNOSIS — D72829 Elevated white blood cell count, unspecified: Secondary | ICD-10-CM | POA: Diagnosis present

## 2024-10-23 DIAGNOSIS — F319 Bipolar disorder, unspecified: Secondary | ICD-10-CM | POA: Diagnosis present

## 2024-10-23 DIAGNOSIS — Z7985 Long-term (current) use of injectable non-insulin antidiabetic drugs: Secondary | ICD-10-CM

## 2024-10-23 DIAGNOSIS — Z880 Allergy status to penicillin: Secondary | ICD-10-CM

## 2024-10-23 DIAGNOSIS — I252 Old myocardial infarction: Secondary | ICD-10-CM

## 2024-10-23 DIAGNOSIS — Z7951 Long term (current) use of inhaled steroids: Secondary | ICD-10-CM | POA: Diagnosis not present

## 2024-10-23 DIAGNOSIS — I2510 Atherosclerotic heart disease of native coronary artery without angina pectoris: Secondary | ICD-10-CM | POA: Diagnosis present

## 2024-10-23 DIAGNOSIS — I10 Essential (primary) hypertension: Secondary | ICD-10-CM | POA: Diagnosis present

## 2024-10-23 DIAGNOSIS — Z7982 Long term (current) use of aspirin: Secondary | ICD-10-CM | POA: Diagnosis not present

## 2024-10-23 DIAGNOSIS — K222 Esophageal obstruction: Secondary | ICD-10-CM | POA: Diagnosis present

## 2024-10-23 DIAGNOSIS — J4489 Other specified chronic obstructive pulmonary disease: Secondary | ICD-10-CM | POA: Diagnosis present

## 2024-10-23 DIAGNOSIS — E119 Type 2 diabetes mellitus without complications: Secondary | ICD-10-CM | POA: Diagnosis not present

## 2024-10-23 DIAGNOSIS — E876 Hypokalemia: Secondary | ICD-10-CM | POA: Diagnosis present

## 2024-10-23 DIAGNOSIS — F419 Anxiety disorder, unspecified: Secondary | ICD-10-CM | POA: Diagnosis present

## 2024-10-23 DIAGNOSIS — Z1152 Encounter for screening for COVID-19: Secondary | ICD-10-CM

## 2024-10-23 DIAGNOSIS — Z87891 Personal history of nicotine dependence: Secondary | ICD-10-CM | POA: Diagnosis not present

## 2024-10-23 DIAGNOSIS — Z885 Allergy status to narcotic agent status: Secondary | ICD-10-CM

## 2024-10-23 DIAGNOSIS — Z955 Presence of coronary angioplasty implant and graft: Secondary | ICD-10-CM

## 2024-10-23 DIAGNOSIS — E8721 Acute metabolic acidosis: Secondary | ICD-10-CM | POA: Diagnosis present

## 2024-10-23 DIAGNOSIS — G4733 Obstructive sleep apnea (adult) (pediatric): Secondary | ICD-10-CM | POA: Diagnosis present

## 2024-10-23 DIAGNOSIS — Z9641 Presence of insulin pump (external) (internal): Secondary | ICD-10-CM | POA: Diagnosis present

## 2024-10-23 DIAGNOSIS — I69351 Hemiplegia and hemiparesis following cerebral infarction affecting right dominant side: Secondary | ICD-10-CM | POA: Diagnosis not present

## 2024-10-23 DIAGNOSIS — E1165 Type 2 diabetes mellitus with hyperglycemia: Secondary | ICD-10-CM | POA: Diagnosis present

## 2024-10-23 DIAGNOSIS — Z794 Long term (current) use of insulin: Secondary | ICD-10-CM | POA: Diagnosis not present

## 2024-10-23 DIAGNOSIS — Z823 Family history of stroke: Secondary | ICD-10-CM

## 2024-10-23 DIAGNOSIS — Z79899 Other long term (current) drug therapy: Secondary | ICD-10-CM

## 2024-10-23 DIAGNOSIS — E66811 Obesity, class 1: Secondary | ICD-10-CM | POA: Diagnosis present

## 2024-10-23 DIAGNOSIS — K219 Gastro-esophageal reflux disease without esophagitis: Secondary | ICD-10-CM | POA: Diagnosis present

## 2024-10-23 DIAGNOSIS — Z881 Allergy status to other antibiotic agents status: Secondary | ICD-10-CM

## 2024-10-23 DIAGNOSIS — Z90710 Acquired absence of both cervix and uterus: Secondary | ICD-10-CM

## 2024-10-23 DIAGNOSIS — F17200 Nicotine dependence, unspecified, uncomplicated: Secondary | ICD-10-CM | POA: Diagnosis not present

## 2024-10-23 DIAGNOSIS — Z882 Allergy status to sulfonamides status: Secondary | ICD-10-CM

## 2024-10-23 DIAGNOSIS — E860 Dehydration: Secondary | ICD-10-CM | POA: Diagnosis present

## 2024-10-23 DIAGNOSIS — E785 Hyperlipidemia, unspecified: Secondary | ICD-10-CM | POA: Diagnosis present

## 2024-10-23 DIAGNOSIS — I959 Hypotension, unspecified: Secondary | ICD-10-CM | POA: Diagnosis present

## 2024-10-23 DIAGNOSIS — E871 Hypo-osmolality and hyponatremia: Secondary | ICD-10-CM | POA: Diagnosis present

## 2024-10-23 DIAGNOSIS — Z8711 Personal history of peptic ulcer disease: Secondary | ICD-10-CM

## 2024-10-23 DIAGNOSIS — K224 Dyskinesia of esophagus: Secondary | ICD-10-CM | POA: Diagnosis present

## 2024-10-23 DIAGNOSIS — N179 Acute kidney failure, unspecified: Principal | ICD-10-CM | POA: Diagnosis present

## 2024-10-23 DIAGNOSIS — R1310 Dysphagia, unspecified: Secondary | ICD-10-CM | POA: Diagnosis not present

## 2024-10-23 DIAGNOSIS — Z8249 Family history of ischemic heart disease and other diseases of the circulatory system: Secondary | ICD-10-CM | POA: Diagnosis not present

## 2024-10-23 DIAGNOSIS — E0800 Diabetes mellitus due to underlying condition with hyperosmolarity without nonketotic hyperglycemic-hyperosmolar coma (NKHHC): Secondary | ICD-10-CM

## 2024-10-23 DIAGNOSIS — Z6831 Body mass index (BMI) 31.0-31.9, adult: Secondary | ICD-10-CM | POA: Diagnosis not present

## 2024-10-23 DIAGNOSIS — Z818 Family history of other mental and behavioral disorders: Secondary | ICD-10-CM

## 2024-10-23 LAB — COMPREHENSIVE METABOLIC PANEL WITH GFR
ALT: 21 U/L (ref 0–44)
AST: 21 U/L (ref 15–41)
Albumin: 4.5 g/dL (ref 3.5–5.0)
Alkaline Phosphatase: 102 U/L (ref 38–126)
Anion gap: 17 — ABNORMAL HIGH (ref 5–15)
BUN: 31 mg/dL — ABNORMAL HIGH (ref 6–20)
CO2: 22 mmol/L (ref 22–32)
Calcium: 9.8 mg/dL (ref 8.9–10.3)
Chloride: 95 mmol/L — ABNORMAL LOW (ref 98–111)
Creatinine, Ser: 3.21 mg/dL — ABNORMAL HIGH (ref 0.44–1.00)
GFR, Estimated: 17 mL/min — ABNORMAL LOW (ref >60)
Glucose, Bld: 185 mg/dL — ABNORMAL HIGH (ref 70–99)
Potassium: 3.3 mmol/L — ABNORMAL LOW (ref 3.5–5.1)
Sodium: 134 mmol/L — ABNORMAL LOW (ref 135–145)
Total Bilirubin: 0.5 mg/dL (ref 0.0–1.2)
Total Protein: 8.1 g/dL (ref 6.5–8.1)

## 2024-10-23 LAB — URINALYSIS, ROUTINE W REFLEX MICROSCOPIC
Bacteria, UA: NONE SEEN
Glucose, UA: 50 mg/dL — AB
Hgb urine dipstick: NEGATIVE
Ketones, ur: NEGATIVE mg/dL
Nitrite: NEGATIVE
Protein, ur: 100 mg/dL — AB
Specific Gravity, Urine: 1.021 (ref 1.005–1.030)
pH: 5 (ref 5.0–8.0)

## 2024-10-23 LAB — CBC WITH DIFFERENTIAL/PLATELET
Abs Immature Granulocytes: 0.03 K/uL (ref 0.00–0.07)
Basophils Absolute: 0 K/uL (ref 0.0–0.1)
Basophils Relative: 0 %
Eosinophils Absolute: 0.1 K/uL (ref 0.0–0.5)
Eosinophils Relative: 1 %
HCT: 44.7 % (ref 36.0–46.0)
Hemoglobin: 14.6 g/dL (ref 12.0–15.0)
Immature Granulocytes: 0 %
Lymphocytes Relative: 27 %
Lymphs Abs: 3 K/uL (ref 0.7–4.0)
MCH: 26.5 pg (ref 26.0–34.0)
MCHC: 32.7 g/dL (ref 30.0–36.0)
MCV: 81.1 fL (ref 80.0–100.0)
Monocytes Absolute: 0.6 K/uL (ref 0.1–1.0)
Monocytes Relative: 6 %
Neutro Abs: 7.4 K/uL (ref 1.7–7.7)
Neutrophils Relative %: 66 %
Platelets: 250 K/uL (ref 150–400)
RBC: 5.51 MIL/uL — ABNORMAL HIGH (ref 3.87–5.11)
RDW: 15 % (ref 11.5–15.5)
WBC: 11.1 K/uL — ABNORMAL HIGH (ref 4.0–10.5)
nRBC: 0 % (ref 0.0–0.2)

## 2024-10-23 LAB — LIPASE, BLOOD: Lipase: 32 U/L (ref 11–51)

## 2024-10-23 LAB — GLUCOSE, CAPILLARY: Glucose-Capillary: 177 mg/dL — ABNORMAL HIGH (ref 70–99)

## 2024-10-23 MED ORDER — ATENOLOL 50 MG PO TABS: 50.0000 mg | ORAL_TABLET | Freq: Every day | ORAL | Status: DC

## 2024-10-23 MED ORDER — ENOXAPARIN SODIUM 30 MG/0.3ML IJ SOSY
30.0000 mg | PREFILLED_SYRINGE | INTRAMUSCULAR | Status: DC
  Administered 2024-10-23: 30 mg via SUBCUTANEOUS
  Filled 2024-10-23: qty 0.3

## 2024-10-23 MED ORDER — ACETAMINOPHEN 325 MG PO TABS
650.0000 mg | ORAL_TABLET | Freq: Four times a day (QID) | ORAL | Status: DC | PRN
Start: 2024-10-23 — End: 2024-10-27
  Filled 2024-10-23: qty 2

## 2024-10-23 MED ORDER — LACTATED RINGERS IV SOLN: INTRAVENOUS | Status: DC

## 2024-10-23 MED ORDER — ACETAMINOPHEN 650 MG RE SUPP: 650.0000 mg | Freq: Four times a day (QID) | RECTAL | Status: DC | PRN

## 2024-10-23 MED ORDER — PANTOPRAZOLE SODIUM 40 MG PO TBEC
40.0000 mg | DELAYED_RELEASE_TABLET | Freq: Every day | ORAL | Status: DC
  Administered 2024-10-23 – 2024-10-27 (×5): 40 mg via ORAL
  Filled 2024-10-23 (×5): qty 1

## 2024-10-23 MED ORDER — SODIUM CHLORIDE 0.9 % IV BOLUS
1000.0000 mL | Freq: Once | INTRAVENOUS | Status: AC
  Administered 2024-10-23: 1000 mL via INTRAVENOUS

## 2024-10-23 MED ORDER — POTASSIUM CHLORIDE 10 MEQ/100ML IV SOLN
10.0000 meq | INTRAVENOUS | Status: AC
  Administered 2024-10-23 – 2024-10-24 (×3): 10 meq via INTRAVENOUS
  Filled 2024-10-23 (×3): qty 100

## 2024-10-23 MED ORDER — AMLODIPINE BESYLATE 10 MG PO TABS: 10.0000 mg | ORAL_TABLET | Freq: Every day | ORAL | Status: DC

## 2024-10-23 MED ORDER — OXYCODONE HCL 5 MG PO TABS
5.0000 mg | ORAL_TABLET | Freq: Once | ORAL | Status: AC | PRN
  Administered 2024-10-23: 5 mg via ORAL
  Filled 2024-10-23: qty 1

## 2024-10-23 MED ORDER — ONDANSETRON HCL 4 MG/2ML IJ SOLN: 4.0000 mg | Freq: Four times a day (QID) | INTRAMUSCULAR | Status: DC | PRN

## 2024-10-23 MED ORDER — SENNA 8.6 MG PO TABS
1.0000 | ORAL_TABLET | Freq: Every day | ORAL | Status: DC
  Administered 2024-10-23 – 2024-10-26 (×4): 8.6 mg via ORAL
  Filled 2024-10-23 (×4): qty 1

## 2024-10-23 MED ORDER — ONDANSETRON HCL 4 MG PO TABS: 4.0000 mg | ORAL_TABLET | Freq: Four times a day (QID) | ORAL | Status: DC | PRN

## 2024-10-23 MED ORDER — TRAZODONE HCL 50 MG PO TABS
50.0000 mg | ORAL_TABLET | Freq: Once | ORAL | Status: AC
  Administered 2024-10-23: 50 mg via ORAL
  Filled 2024-10-23: qty 1

## 2024-10-23 MED ORDER — INSULIN ASPART 100 UNIT/ML IJ SOLN
0.0000 [IU] | Freq: Three times a day (TID) | INTRAMUSCULAR | Status: DC
  Filled 2024-10-23: qty 0.06

## 2024-10-23 MED ORDER — BISACODYL 5 MG PO TBEC
5.0000 mg | DELAYED_RELEASE_TABLET | Freq: Every day | ORAL | Status: DC | PRN
  Administered 2024-10-24: 5 mg via ORAL
  Filled 2024-10-23: qty 1

## 2024-10-23 MED ORDER — TOPIRAMATE 100 MG PO TABS
200.0000 mg | ORAL_TABLET | Freq: Every day | ORAL | Status: DC
  Administered 2024-10-23 – 2024-10-26 (×4): 200 mg via ORAL
  Filled 2024-10-23 (×4): qty 2

## 2024-10-23 NOTE — H&P (Addendum)
 History and Physical    Patient: Stephanie Thornton FMW:[id number] DOB: March 12, 1971 DOA: 10/23/2024 DOS: the patient was seen and examined on 10/23/2024 PCP: Austin Mutton, MD  Patient coming from: Home  Chief Complaint:  Chief Complaint  Patient presents with   Dizziness   Loss of Consciousness   HPI: Stephanie Thornton is a 53 y.o. female with medical history significant for type 2 diabetes mellitus, OSA on CPAP, hypertension, history of CVA, GERD, esophageal stricture, type 2 diabetes mellitus, and bipolar disorder who presents because of worsening dizziness and intractable vomiting.  The patient was admitted for the same thing 8 days ago.  She reports that she was discharged 5 days ago feeling good.  She was able to eat with no nausea no vomiting no abdominal discomfort everything was fine.  2 days ago she worked all day and had no trouble that evening she went out to eat and that night she started vomiting again.  The next day she vomited multiple times.  She really thought this time it was something that she ate.  She stayed in the bed and vomited all day yesterday and she started to get dizzy yesterday.  She had to get up to go to the bathroom she was dizzy.  The dizziness got worse and worse and the vomiting continued so she presented to the ED today.  She says this feels just like last time except that her dizziness with standing is worse this time.  No diarrhea no fever no chills.  She made an appointment with her GI doctor which will be in 5 days.  She has had esophageal strictures that have been stretched in the past and she feels like her food is getting caught.  She sometimes vomits up undigested recognizable food.  When EMS arrived the patient was hypotensive with a systolic blood pressure in the 60s.  With IV fluids her blood pressure has improved.  In the emergency department her workup also revealed a low potassium and an elevated creatinine.  She will be admitted to the hospitalist service  for continued IV fluids and management.   Review of Systems: As mentioned in the history of present illness. All other systems reviewed and are negative. Past Medical History:  Diagnosis Date   Anxiety    Arthritis    knees, feet, back (05/24/2015)   Benign essential HTN 05/01/2015   Bipolar disorder (HCC) diagnosed early 90s   CAD in native artery    Chronic bronchitis (HCC)    get it q yr   Chronic lower back pain    Depression    Diabetes mellitus type II 2010   GERD (gastroesophageal reflux disease)    Headache    maybe 3 times/wk (05/24/2015)   History of hiatal hernia    History of stomach ulcers    HLD (hyperlipidemia)    Menopause    Migraine    1-2/wk (05/24/2015)   NSTEMI (non-ST elevated myocardial infarction) (HCC) 04/2022   OSA on CPAP    Stroke (HCC)    r sided weakness   Past Surgical History:  Procedure Laterality Date   ABDOMINAL HYSTERECTOMY  2007   BUNIONECTOMY Bilateral    CARPAL TUNNEL RELEASE Right    CORONARY STENT INTERVENTION N/A 05/07/2022   Procedure: CORONARY STENT INTERVENTION;  Surgeon: Claudene Victory ORN, MD;  Location: [facility] INVASIVE CV LAB;  Service: Cardiovascular;  Laterality: N/A;   HAMMER TOE SURGERY Left    LEFT HEART CATH AND CORONARY ANGIOGRAPHY N/A  05/20/2018   Procedure: LEFT HEART CATH AND CORONARY ANGIOGRAPHY;  Surgeon: Anner Alm ORN, MD;  Location: [facility] INVASIVE CV LAB;  Service: Cardiovascular;  Laterality: N/A;   LEFT HEART CATH AND CORONARY ANGIOGRAPHY N/A 05/07/2022   Procedure: LEFT HEART CATH AND CORONARY ANGIOGRAPHY;  Surgeon: Claudene Victory ORN, MD;  Location: [facility] INVASIVE CV LAB;  Service: Cardiovascular;  Laterality: N/A;   TONSILLECTOMY Bilateral 05/24/2015   Procedure: TONSILLECTOMY;  Surgeon: Vaughan Ricker, MD;  Location: [facility] OR;  Service: ENT;  Laterality: Bilateral;   Social History:  reports that she quit smoking about 2 years ago. Her smoking use included cigarettes. She started smoking about 32 years ago. She has a 30  pack-year smoking history. She has never used smokeless tobacco. She reports that she does not drink alcohol and does not use drugs.  Allergies  Allergen Reactions   Lyrica [Pregabalin] Shortness Of Breath, Itching and Rash   Rocephin  [Ceftriaxone  Sodium In Dextrose ] Shortness Of Breath   Sulfa Antibiotics Hives, Shortness Of Breath and Itching   Ultram [Tramadol] Hives and Shortness Of Breath    Had asthma attack   Vancomycin Anaphylaxis    Per Dr. Precilla   Glucotrol  [Glipizide ] Hives   Keflex  [place].[email or web address] ] Hives   Metformin  Dermatitis   Penicillins Hives   Janumet  [Sitagliptin  Phos-Metformin  Hcl] Nausea Only    Family History  Problem Relation Age of Onset   Bipolar disorder Mother    CVA Mother    Bipolar disorder Son    Depression Brother        schizoaffective d/o   Hypertension Sister    Heart disease Maternal Aunt    Heart attack Maternal Aunt    Hypertension Brother     Prior to Admission medications   Medication Sig Start Date End Date Taking? Authorizing Provider  Acetaminophen  (TYLENOL  PO) Take 2 tablets by mouth 3 (three) times daily as needed (tooth ache).   Yes [provider]  amitriptyline  (ELAVIL ) 150 MG tablet Take 150 mg by mouth at bedtime. 08/06/22  Yes [provider]  amLODipine  (NORVASC ) 10 MG tablet Take 1 tablet (10 mg total) by mouth daily. 09/23/22  Yes Turner, Wilbert SAUNDERS, MD  aspirin  81 MG chewable tablet Chew 1 tablet (81 mg total) by mouth daily. 05/09/22  Yes Zhao, Xika, NP  atenolol  (TENORMIN ) 50 MG tablet Take 50 mg by mouth daily. 10/24/23  Yes [provider]  baclofen  (LIORESAL ) 10 MG tablet Take 10 mg by mouth 3 (three) times daily. 05/05/22  Yes [provider]  busPIRone  (BUSPAR ) 15 MG tablet Take 15 mg by mouth 3 (three) times daily. 07/10/24  Yes [provider]  escitalopram  (LEXAPRO ) 5 MG tablet Take 5 mg by mouth every morning. 07/10/24  Yes [provider]  HUMALOG 100 UNIT/ML injection 60  UNITS DAILY VIA INSULIN  PUMP INJECTION 30 DAYS   Yes [provider]  hydrOXYzine  (ATARAX ) 25 MG tablet TAKE 1 TABLET BY ORAL ROUTE 3 TIMES PER DAY TAKE FOR ANXIETY, PANIC, AGITATION, AND SLEEP AS NEEDED   Yes [provider]  Insulin  Disposable Pump (OMNIPOD 5 [id number] PODS GEN 5) MISC 60 Units every 3 (three) days. 10/03/24  Yes [provider]  LATUDA  80 MG TABS tablet Take 80 mg by mouth every evening. 02/01/19  Yes [provider]  nitroGLYCERIN  (NITROSTAT ) 0.4 MG SL tablet Place 1 tablet (0.4 mg total) under the tongue every 5 (five) minutes x 3 doses as needed for chest pain. 05/09/22  Yes  Zhao, Xika, NP  Oxycodone  HCl 10 MG TABS Take 10 mg by mouth 4 (four) times daily as needed (pain). 07/08/22  Yes [provider]  OZEMPIC, 0.25 OR 0.5 MG/DOSE, 2 MG/3ML SOPN Use as directed weekly. 10/17/24  Yes [provider]  pantoprazole  (PROTONIX ) 40 MG tablet Take 40 mg by mouth daily. 02/19/24  Yes [provider]  polyethylene glycol (MIRALAX  / GLYCOLAX ) 17 g packet Take 17 g by mouth daily. 10/19/24  Yes Franchot Novel, MD  rosuvastatin  (CRESTOR ) 20 MG tablet Take 20 mg by mouth at bedtime. 07/29/23  Yes [provider]  senna (SENOKOT) 8.6 MG TABS tablet Take 1 tablet (8.6 mg total) by mouth at bedtime. 10/18/24  Yes Franchot Novel, MD  SYMBICORT 160-4.5 MCG/ACT inhaler SMARTSIG:2 inhalation Via Inhaler Twice Daily PRN 04/24/24  Yes [provider]  topiramate  (TOPAMAX ) 200 MG tablet Take 200 mg by mouth at bedtime. 01/28/19  Yes [provider]  traZODone  (DESYREL ) 100 MG tablet Take 250 mg by mouth at bedtime. 02/24/19  Yes [provider]  Continuous Glucose Sensor (DEXCOM G7 SENSOR) MISC SMARTSIG:1 Every 10 Days 10/01/24   [provider]  gabapentin  (NEURONTIN ) 300 MG capsule Take 1 capsule (300 mg total) by mouth 3 (three) times daily. Patient not taking: Reported on 07/19/2024 11/11/22      Insulin  Pen Needle 31G X 5 MM MISC 1 Units by Does not apply route 4 (four) times daily. 09/07/23   Rojelio Nest, DO  lidocaine  (LIDODERM ) 5 % Place 1 patch onto the skin daily. Remove & Discard patch within 12 hours or as directed by MD Patient not taking: Reported on 07/19/2024 12/31/23   Ruthe Cornet, DO  lisinopril -hydrochlorothiazide  (ZESTORETIC ) 20-12.5 MG tablet Take 1 tablet by mouth daily. Patient not taking: Reported on 10/15/2024    [provider]    Physical Exam: Vitals:   10/23/24 1715 10/23/24 1730 10/23/24 1745 10/23/24 1845  BP: (!) 122/93 (!) 136/98 (!) 138/92   Pulse: 86 87 87   Resp: 10     Temp:    (!) 97.5 F (36.4 C)  TempSrc:    Oral  SpO2: 99% 97% 100%   Weight:      Height:       Physical Exam:  General: No acute distress, well developed, well nourished HEENT: Normocephalic, atraumatic, PERRL Cardiovascular: Normal rate and rhythm. Distal pulses intact. Pulmonary: Normal pulmonary effort, normal breath sounds Gastrointestinal: Nondistended abdomen, soft, non tender, hypoactive bowel sounds Musculoskeletal:Normal ROM, no lower ext edema Lymphadenopathy: No cervical LAD. Skin: Skin is warm and dry. Neuro: No focal deficits noted, AAOx3. PSYCH: Attentive and cooperative  Data Reviewed:  Results for orders placed or performed during the hospital encounter of 10/23/24 (from the past 24 hours)  Urinalysis, Routine w reflex microscopic -Urine, Clean Catch     Status: Abnormal   Collection Time: 10/23/24  4:04 AM  Result Value Ref Range   Color, Urine AMBER (A) YELLOW   APPearance TURBID (A) CLEAR   Specific Gravity, Urine 1.021 1.005 - 1.030   pH 5.0 5.0 - 8.0   Glucose, UA 50 (A) NEGATIVE mg/dL   Hgb urine dipstick NEGATIVE NEGATIVE   Bilirubin Urine SMALL (A) NEGATIVE   Ketones, ur NEGATIVE NEGATIVE mg/dL   Protein, ur 899 (A) NEGATIVE mg/dL   Nitrite NEGATIVE NEGATIVE   Leukocytes,Ua MODERATE (A) NEGATIVE   RBC / HPF 0-5 0 - 5 RBC/hpf    WBC, UA 0-5 0 - 5 WBC/hpf  Bacteria, UA NONE SEEN NONE SEEN   Squamous Epithelial / HPF 0-5 0 - 5 /HPF  CBC with Differential     Status: Abnormal   Collection Time: 10/23/24  3:58 PM  Result Value Ref Range   WBC 11.1 (H) 4.0 - 10.5 K/uL   RBC 5.51 (H) 3.87 - 5.11 MIL/uL   Hemoglobin 14.6 12.0 - 15.0 g/dL   HCT 55.2 63.9 - 53.9 %   MCV 81.1 80.0 - 100.0 fL   MCH 26.5 26.0 - 34.0 pg   MCHC 32.7 30.0 - 36.0 g/dL   RDW 84.9 88.4 - 84.4 %   Platelets 250 150 - 400 K/uL   nRBC 0.0 0.0 - 0.2 %   Neutrophils Relative % 66 %   Neutro Abs 7.4 1.7 - 7.7 K/uL   Lymphocytes Relative 27 %   Lymphs Abs 3.0 0.7 - 4.0 K/uL   Monocytes Relative 6 %   Monocytes Absolute 0.6 0.1 - 1.0 K/uL   Eosinophils Relative 1 %   Eosinophils Absolute 0.1 0.0 - 0.5 K/uL   Basophils Relative 0 %   Basophils Absolute 0.0 0.0 - 0.1 K/uL   Immature Granulocytes 0 %   Abs Immature Granulocytes 0.03 0.00 - 0.07 K/uL  Comprehensive metabolic panel     Status: Abnormal   Collection Time: 10/23/24  3:58 PM  Result Value Ref Range   Sodium 134 (L) 135 - 145 mmol/L   Potassium 3.3 (L) 3.5 - 5.1 mmol/L   Chloride 95 (L) 98 - 111 mmol/L   CO2 22 22 - 32 mmol/L   Glucose, Bld 185 (H) 70 - 99 mg/dL   BUN 31 (H) 6 - 20 mg/dL   Creatinine, Ser 6.78 (H) 0.44 - 1.00 mg/dL   Calcium  9.8 8.9 - 10.3 mg/dL   Total Protein 8.1 6.5 - 8.1 g/dL   Albumin 4.5 3.5 - 5.0 g/dL   AST 21 15 - 41 U/L   ALT 21 0 - 44 U/L   Alkaline Phosphatase 102 38 - 126 U/L   Total Bilirubin 0.5 0.0 - 1.2 mg/dL   GFR, Estimated 17 (L) >60 mL/min   Anion gap 17 (H) 5 - 15  Lipase, blood     Status: None   Collection Time: 10/23/24  3:59 PM  Result Value Ref Range   Lipase 32 11 - 51 U/L     Assessment and Plan: Intractable vomiting, acute kidney injury, dehydration - - creatinine is 3.2 today.  It was 1.03 at discharge 5 days ago - IV fluids.  Monitor creatinine - Phenergan  for nausea seems to be working. She should probably get a  prescription for Phenergan  suppositories at discharge. -Barium swallow to rule out esophageal stricture or Zenker's diverticulum.  The patient has a history of esophageal stricture and has an outpatient appointment scheduled with GI from [facility] in 5 days.  2. Hypokalemia - Replete.  Check magnesium  in am.  3.  DMT2 -monitor blood sugars and add corrective dose insulin  while she is NPO. - She has not restarted Ozempic - She was discharged on MiraLAX  daily.  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  4.  Hypertension -the patient was hypotensive on arrival but with IV fluids that has already reversed.  Will restart her blood pressure medications in the AM.  5. OSA -continue CPAP  6. CAD - Stable   Advance Care Planning:   Code Status: Full Code the patient names her Sister Kenneth as her surrogate  decision maker and she wants to be full code.  Consults: None  Family Communication: None  Severity of Illness: The appropriate patient status for this patient is INPATIENT. Inpatient status is judged to be reasonable and necessary in order to provide the required intensity of service to ensure the patient's safety. The patient's presenting symptoms, physical exam findings, and initial radiographic and laboratory data in the context of their chronic comorbidities is felt to place them at high risk for further clinical deterioration. Furthermore, it is not anticipated that the patient will be medically stable for discharge from the hospital within 2 midnights of admission.   * I certify that at the point of admission it is my clinical judgment that the patient will require inpatient hospital care spanning beyond 2 midnights from the point of admission due to high intensity of service, high risk for further deterioration and high frequency of surveillance required.*  Author: ARTHEA CHILD, MD 10/23/2024 7:53 PM  For on call review [email or web address].

## 2024-10-23 NOTE — ED Provider Notes (Signed)
 [facility] AT [facility] Provider Note   CSN: [id number] Arrival date & time: 10/23/24  1434     Patient presents with: Dizziness and Loss of Consciousness   Stephanie Thornton is a 53 y.o. female history of diabetes, CAD, MI, hypertension, CVA, bipolar presents with complaints of dizziness, nausea vomiting and syncope.  Patient reports that the past few days.  She has had recurrent dizziness with nausea and vomiting.  Patient does report that she was able to keep something down last night however.  Dizziness is episodic, worse with movement.  Patient reports that today she was standing when she had a witnessed syncopal episode.  Reports that she did not hit her head however.  Denies feeling confused afterwards.  Denies any history of seizure.  Denies any abdominal pain.  Reports recent successful bowel movement.  Patient recently admitted for similar complaints.     Dizziness Associated symptoms: syncope   Loss of Consciousness Associated symptoms: dizziness       Past Medical History:  Diagnosis Date   Anxiety    Arthritis    knees, feet, back (05/24/2015)   Benign essential HTN 05/01/2015   Bipolar disorder (HCC) diagnosed early 90s   CAD in native artery    Chronic bronchitis (HCC)    get it q yr   Chronic lower back pain    Depression    Diabetes mellitus type II 2010   GERD (gastroesophageal reflux disease)    Headache    maybe 3 times/wk (05/24/2015)   History of hiatal hernia    History of stomach ulcers    HLD (hyperlipidemia)    Menopause    Migraine    1-2/wk (05/24/2015)   NSTEMI (non-ST elevated myocardial infarction) (HCC) 04/2022   OSA on CPAP    Stroke (HCC)    r sided weakness   Past Surgical History:  Procedure Laterality Date   ABDOMINAL HYSTERECTOMY  2007   BUNIONECTOMY Bilateral    CARPAL TUNNEL RELEASE Right    CORONARY STENT INTERVENTION N/A 05/07/2022   Procedure: CORONARY STENT INTERVENTION;  Surgeon:  Claudene Victory ORN, MD;  Location: [facility] INVASIVE CV LAB;  Service: Cardiovascular;  Laterality: N/A;   HAMMER TOE SURGERY Left    LEFT HEART CATH AND CORONARY ANGIOGRAPHY N/A 05/20/2018   Procedure: LEFT HEART CATH AND CORONARY ANGIOGRAPHY;  Surgeon: Anner Alm ORN, MD;  Location: [facility] INVASIVE CV LAB;  Service: Cardiovascular;  Laterality: N/A;   LEFT HEART CATH AND CORONARY ANGIOGRAPHY N/A 05/07/2022   Procedure: LEFT HEART CATH AND CORONARY ANGIOGRAPHY;  Surgeon: Claudene Victory ORN, MD;  Location: [facility] INVASIVE CV LAB;  Service: Cardiovascular;  Laterality: N/A;   TONSILLECTOMY Bilateral 05/24/2015   Procedure: TONSILLECTOMY;  Surgeon: Vaughan Ricker, MD;  Location: [facility] OR;  Service: ENT;  Laterality: Bilateral;     Prior to Admission medications   Medication Sig Start Date End Date Taking? Authorizing Provider  Acetaminophen  (TYLENOL  PO) Take 2 tablets by mouth 3 (three) times daily as needed (tooth ache).    [provider]  amitriptyline  (ELAVIL ) 150 MG tablet Take 150 mg by mouth at bedtime. 08/06/22   [provider]  amLODipine  (NORVASC ) 10 MG tablet Take 1 tablet (10 mg total) by mouth daily. 09/23/22   Shlomo Wilbert SAUNDERS, MD  aspirin  81 MG chewable tablet Chew 1 tablet (81 mg total) by mouth daily. 05/09/22   Zhao, Xika, NP  atenolol  (TENORMIN ) 50 MG tablet Take 50 mg by mouth daily.  10/24/23   [provider]  baclofen  (LIORESAL ) 10 MG tablet Take 10 mg by mouth 3 (three) times daily. 05/05/22   [provider]  busPIRone  (BUSPAR ) 15 MG tablet Take 15 mg by mouth 3 (three) times daily. 07/10/24   [provider]  Continuous Glucose Sensor (DEXCOM G7 SENSOR) MISC SMARTSIG:1 Every 10 Days 10/01/24   [provider]  escitalopram  (LEXAPRO ) 5 MG tablet Take 5 mg by mouth every morning. 07/10/24   [provider]  gabapentin  (NEURONTIN ) 300 MG capsule Take 1 capsule (300 mg total) by mouth 3 (three) times daily. Patient not taking: Reported on 07/19/2024  11/11/22     HUMALOG 100 UNIT/ML injection 60 UNITS DAILY VIA INSULIN  PUMP INJECTION 30 DAYS    [provider]  hydrOXYzine  (ATARAX ) 25 MG tablet TAKE 1 TABLET BY ORAL ROUTE 3 TIMES PER DAY TAKE FOR ANXIETY, PANIC, AGITATION, AND SLEEP AS NEEDED    [provider]  Insulin  Disposable Pump (OMNIPOD 5 DEXG7G6 PODS GEN 5) MISC 60 Units every 3 (three) days. 10/03/24   [provider]  Insulin  Pen Needle 31G X 5 MM MISC 1 Units by Does not apply route 4 (four) times daily. 09/07/23   Rojelio Nest, DO  LATUDA  80 MG TABS tablet Take 80 mg by mouth every evening. 02/01/19   [provider]  lidocaine  (LIDODERM ) 5 % Place 1 patch onto the skin daily. Remove & Discard patch within 12 hours or as directed by MD Patient not taking: Reported on 07/19/2024 12/31/23   Ruthe Cornet, DO  lisinopril -hydrochlorothiazide  (ZESTORETIC ) 20-12.5 MG tablet Take 1 tablet by mouth daily. Patient not taking: Reported on 10/15/2024    [provider]  nitroGLYCERIN  (NITROSTAT ) 0.4 MG SL tablet Place 1 tablet (0.4 mg total) under the tongue every 5 (five) minutes x 3 doses as needed for chest pain. 05/09/22   Zhao, Xika, NP  Oxycodone  HCl 10 MG TABS Take 10 mg by mouth 4 (four) times daily as needed (pain). 07/08/22   [provider]  pantoprazole  (PROTONIX ) 40 MG tablet Take 40 mg by mouth daily. 02/19/24   [provider]  polyethylene glycol (MIRALAX  / GLYCOLAX ) 17 g packet Take 17 g by mouth daily. 10/19/24   Franchot Novel, MD  rosuvastatin  (CRESTOR ) 20 MG tablet Take 20 mg by mouth at bedtime. 07/29/23   [provider]  senna (SENOKOT) 8.6 MG TABS tablet Take 1 tablet (8.6 mg total) by mouth at bedtime. 10/18/24   Franchot Novel, MD  SYMBICORT 160-4.5 MCG/ACT inhaler SMARTSIG:2 inhalation Via Inhaler Twice Daily PRN 04/24/24   [provider]  topiramate  (TOPAMAX ) 200 MG tablet Take 200 mg by mouth at bedtime. 01/28/19   [provider]  traZODone  (DESYREL ) 100 MG tablet Take 250 mg by mouth at bedtime. 02/24/19   [provider]    Allergies: Lyrica [pregabalin], Rocephin  [ceftriaxone  sodium in dextrose ], Sulfa antibiotics, Ultram [tramadol], Vancomycin, Glucotrol  [glipizide ], Keflex  [cephalexin ], Metformin , Penicillins, and Janumet  [sitagliptin  phos-metformin  hcl]    Review of Systems  Cardiovascular:  Positive for syncope.  Neurological:  Positive for dizziness.    Updated Vital Signs BP (!) 138/92   Pulse 87   Temp (!) 97.4 F (36.3 C) (Oral)   Resp 10   Ht 5' 2 (1.575 m)   Wt 75 kg   SpO2 100%   BMI 30.24 kg/m   Physical Exam Vitals and nursing note reviewed.  Constitutional:      General: She is  not in acute distress.    Appearance: She is well-developed.  HENT:     Head: Normocephalic and atraumatic.  Eyes:     Conjunctiva/sclera: Conjunctivae normal.  Cardiovascular:     Rate and Rhythm: Normal rate and regular rhythm.     Heart sounds: No murmur heard. Pulmonary:     Effort: Pulmonary effort is normal. No respiratory distress.     Breath sounds: Normal breath sounds.  Abdominal:     Palpations: Abdomen is soft.     Tenderness: There is no abdominal tenderness.  Musculoskeletal:        General: No swelling.     Cervical back: Neck supple.  Skin:    General: Skin is warm and dry.     Capillary Refill: Capillary refill takes less than 2 seconds.  Neurological:     Mental Status: She is alert.     Comments: Patient is alert and oriented. There is no abnormal phonation. Symmetric smile without facial droop.  Moves all extremities spontaneously. 5/5 strength in upper and lower extremities. . No sensation deficit. There is no nystagmus. EOMI, PERRL. Coordination intact with finger to nose  Psychiatric:        Mood and Affect: Mood normal.     (all labs ordered are listed, but only abnormal results are displayed) Labs Reviewed  CBC WITH DIFFERENTIAL/PLATELET - Abnormal; Notable  for the following components:      Result Value   WBC 11.1 (*)    RBC 5.51 (*)    All other components within normal limits  COMPREHENSIVE METABOLIC PANEL WITH GFR - Abnormal; Notable for the following components:   Sodium 134 (*)    Potassium 3.3 (*)    Chloride 95 (*)    Glucose, Bld 185 (*)    BUN 31 (*)    Creatinine, Ser 3.21 (*)    GFR, Estimated 17 (*)    Anion gap 17 (*)    All other components within normal limits  URINALYSIS, ROUTINE W REFLEX MICROSCOPIC - Abnormal; Notable for the following components:   Color, Urine AMBER (*)    APPearance TURBID (*)    Glucose, UA 50 (*)    Bilirubin Urine SMALL (*)    Protein, ur 100 (*)    Leukocytes,Ua MODERATE (*)    All other components within normal limits  LIPASE, BLOOD    EKG: EKG Interpretation Date/Time:  Saturday October 23 2024 15:08:14 EDT Ventricular Rate:  83 PR Interval:  167 QRS Duration:  90 QT Interval:  405 QTC Calculation: 476 R Axis:   -46  Text Interpretation: Sinus rhythm Probable left atrial enlargement Left anterior fascicular block Abnormal R-wave progression, late transition Confirmed by Garrick Charleston [phone]) on 10/23/2024 5:35:46 PM  Radiology: No results found.   Procedures   Medications Ordered in the ED  sodium chloride  0.9 % bolus 1,000 mL (0 mLs Intravenous Stopped 10/23/24 1815)  sodium chloride  0.9 % bolus 1,000 mL (1,000 mLs Intravenous New Bag/Given 10/23/24 1815)    Clinical Course as of 10/23/24 1816  Sat Oct 23, 2024  1529 Patient evaluated for complaints of dizziness, nausea, vomiting and syncopal episode.  Patient with blood pressures of 93/63 otherwise hemodynamically stable.  Appears that she has had multiple visits with similar presentations and has had multiple admissions as well.  Patient was just discharged 5 days ago for dizziness, nausea and vomiting with AKI.  Reports that her symptoms had temporarily improved before returning few days ago.  On exam  her lungs are  clear, her abdomen is nontender.  She has no neurodeficits.  Will proceed with routine labs and IV fluids. [JT]  1617 CBC with Differential(!) Mild leukocytosis of 11.1 [JT]  1644 Comprehensive metabolic panel(!) Creatinine of 3.21, baseline near 1 5 days ago, anion gap of 17 [JT]  1815 Urinalysis, Routine w reflex microscopic -Urine, Clean Catch(!) Moderate leukocytes, negative nitrites, 0-5 WBCs and no bacteria [JT]  1815 Patient will be admitted for AKI [JT]    Clinical Course User Index [JT] Donnajean Lynwood DEL, PA-C                                 Medical Decision Making Amount and/or Complexity of Data Reviewed Labs: ordered.   This patient presents to the ED with chief complaint(s) of dizziness.  The complaint involves an extensive differential diagnosis and also carries with it a high risk of complications and morbidity.   Pertinent past medical history as listed in HPI  The differential diagnosis includes  Do not suspect CVA or TIA.  Has had negative CTs.  Do not suspect intracranial hemorrhage.  She has no abdominal pain and her abdomen is nontender.  Do not suspect intra-abdominal pathology.  Denies cannabis use.   Additional history obtained: Records reviewed Care Everywhere/External Records  Disposition:   Patient be admitted for further workup and management  Social Determinants of Health:   none  This note was dictated with voice recognition software.  Despite best efforts at proofreading, errors may have occurred which can change the documentation meaning.       Final diagnoses:  AKI (acute kidney injury)    ED Discharge Orders     None          Donnajean Lynwood DEL, PA-C 10/23/24 1816    Laurice Maude BROCKS, MD 10/24/24 [phone]

## 2024-10-23 NOTE — ED Triage Notes (Signed)
 Pt to ER via EMS from home with c/o dizziness for last 2 days.  Pt also reports vomiting x last 2 days.  Pt states she had a syncopal episode today.  Pt was released from [facility] on Tuesday for similar symptoms.  Pt has hx of HTN, but recently has been having difficulty with low BP.  Initial BP for EMS was 60/xx, increased to 96/72 by arrival.  CBG 153.  No IV established at this time.

## 2024-10-24 DIAGNOSIS — N179 Acute kidney failure, unspecified: Secondary | ICD-10-CM | POA: Diagnosis not present

## 2024-10-24 LAB — CBC
HCT: 37 % (ref 36.0–46.0)
Hemoglobin: 11.6 g/dL — ABNORMAL LOW (ref 12.0–15.0)
MCH: 25.7 pg — ABNORMAL LOW (ref 26.0–34.0)
MCHC: 31.4 g/dL (ref 30.0–36.0)
MCV: 81.9 fL (ref 80.0–100.0)
Platelets: 194 K/uL (ref 150–400)
RBC: 4.52 MIL/uL (ref 3.87–5.11)
RDW: 14.9 % (ref 11.5–15.5)
WBC: 8.7 K/uL (ref 4.0–10.5)
nRBC: 0 % (ref 0.0–0.2)

## 2024-10-24 LAB — GLUCOSE, CAPILLARY
Glucose-Capillary: 117 mg/dL — ABNORMAL HIGH (ref 70–99)
Glucose-Capillary: 164 mg/dL — ABNORMAL HIGH (ref 70–99)
Glucose-Capillary: 148 mg/dL — ABNORMAL HIGH (ref 70–99)
Glucose-Capillary: 205 mg/dL — ABNORMAL HIGH (ref 70–99)

## 2024-10-24 LAB — BASIC METABOLIC PANEL WITH GFR
Anion gap: 9 (ref 5–15)
BUN: 27 mg/dL — ABNORMAL HIGH (ref 6–20)
CO2: 21 mmol/L — ABNORMAL LOW (ref 22–32)
Calcium: 8.3 mg/dL — ABNORMAL LOW (ref 8.9–10.3)
Chloride: 105 mmol/L (ref 98–111)
Creatinine, Ser: 1.75 mg/dL — ABNORMAL HIGH (ref 0.44–1.00)
GFR, Estimated: 34 mL/min — ABNORMAL LOW (ref >60)
Glucose, Bld: 127 mg/dL — ABNORMAL HIGH (ref 70–99)
Potassium: 3.6 mmol/L (ref 3.5–5.1)
Sodium: 135 mmol/L (ref 135–145)

## 2024-10-24 LAB — MAGNESIUM: Magnesium: 1.8 mg/dL (ref 1.7–2.4)

## 2024-10-24 MED ORDER — BUSPIRONE HCL 5 MG PO TABS
15.0000 mg | ORAL_TABLET | Freq: Three times a day (TID) | ORAL | Status: DC
  Administered 2024-10-24 – 2024-10-27 (×10): 15 mg via ORAL
  Filled 2024-10-24 (×10): qty 1

## 2024-10-24 MED ORDER — ESCITALOPRAM OXALATE 10 MG PO TABS
5.0000 mg | ORAL_TABLET | Freq: Every morning | ORAL | Status: DC
  Administered 2024-10-24 – 2024-10-27 (×4): 5 mg via ORAL
  Filled 2024-10-24 (×4): qty 1

## 2024-10-24 MED ORDER — INSULIN ASPART 100 UNIT/ML IJ SOLN
0.0000 [IU] | INTRAMUSCULAR | Status: DC
  Administered 2024-10-24 – 2024-10-25 (×2): 2 [IU] via SUBCUTANEOUS
  Administered 2024-10-25: 3 [IU] via SUBCUTANEOUS
  Administered 2024-10-25: 1 [IU] via SUBCUTANEOUS
  Administered 2024-10-25 (×2): 2 [IU] via SUBCUTANEOUS
  Administered 2024-10-26: 1 [IU] via SUBCUTANEOUS
  Administered 2024-10-26 (×3): 2 [IU] via SUBCUTANEOUS
  Administered 2024-10-26: 1 [IU] via SUBCUTANEOUS
  Administered 2024-10-27 (×3): 2 [IU] via SUBCUTANEOUS
  Administered 2024-10-27: 1 [IU] via SUBCUTANEOUS

## 2024-10-24 MED ORDER — LURASIDONE HCL 40 MG PO TABS
80.0000 mg | ORAL_TABLET | Freq: Every evening | ORAL | Status: DC
  Administered 2024-10-24 – 2024-10-26 (×3): 80 mg via ORAL
  Filled 2024-10-24 (×4): qty 2
  Filled 2024-10-24: qty 1

## 2024-10-24 MED ORDER — POLYETHYLENE GLYCOL 3350 17 G PO PACK
17.0000 g | PACK | Freq: Every day | ORAL | Status: DC | PRN
  Administered 2024-10-24: 17 g via ORAL
  Filled 2024-10-24: qty 1

## 2024-10-24 MED ORDER — BACLOFEN 10 MG PO TABS
10.0000 mg | ORAL_TABLET | Freq: Three times a day (TID) | ORAL | Status: DC
  Administered 2024-10-24 – 2024-10-27 (×10): 10 mg via ORAL
  Filled 2024-10-24 (×10): qty 1

## 2024-10-24 MED ORDER — LACTATED RINGERS IV SOLN: INTRAVENOUS | Status: DC

## 2024-10-24 MED ORDER — AMITRIPTYLINE HCL 25 MG PO TABS
150.0000 mg | ORAL_TABLET | Freq: Every day | ORAL | Status: DC
  Administered 2024-10-24 – 2024-10-26 (×3): 150 mg via ORAL
  Filled 2024-10-24 (×3): qty 6

## 2024-10-24 MED ORDER — ENOXAPARIN SODIUM 40 MG/0.4ML IJ SOSY
40.0000 mg | PREFILLED_SYRINGE | INTRAMUSCULAR | Status: DC
  Administered 2024-10-24 – 2024-10-26 (×3): 40 mg via SUBCUTANEOUS
  Filled 2024-10-24 (×3): qty 0.4

## 2024-10-24 NOTE — Progress Notes (Signed)
 PROGRESS NOTE    Sahalie Beth  FMW:[id number] DOB: [birth year] DOA: 10/23/2024 PCP: Austin Mutton, MD    Brief Narrative:   Stephanie Thornton is a 53 y.o. female with past medical history significant for HTN, DM2, bipolar disorder, CVA, history of esophageal stricture, OSA on CPAP who presented to [facility] ED on 10/23/2024 with dizziness, recurrent nausea with intractable vomiting.  Recently discharged for same 5 days prior.  Upon discharge he was feeling well and able to eat without issue.  Two days ago in the evening she went out to eat and subsequently started vomiting once again.  Feels that the presenting symptoms are similar to the past except for dizziness was worse this time.  Upon EMS arrival, she was noted to be hypotensive with a systolic blood pressure in the 60s.  Was given IV fluids with improvement.  Patient was transported to the ED for further evaluation and management.  In the ED, temperature 97.4 F, HR 82, RR 18, BP 93/63, SpO2 96% on room air.  WBC 11.1, hemoglobin 14.6, platelet count 250.  Sodium 134, potassium 3.3, chloride 95, CO2 22, glucose 185, BUN 31, creatinine 3.21.  Lipase 32.  AST 21, ALT 21, total bilirubin 0.5.  Urinalysis with moderate leukocytes, negative nitrite, no bacteria, 0-5 WBCs.  Patient received 2 L NS bolus in the ED.  TRH consulted for admission for further evaluation management of intractable nausea/vomiting, acute renal failure  Assessment & Plan:   Intractable nausea/vomiting Acute renal failure Patient presenting with recurrent intractable nausea/vomiting.  Reports history of esophageal stricture requiring balloon dilation.  Patient is afebrile.  Lipase within normal limits.  Total bilirubin normal limits.  Urinalysis unrevealing.  Creatinine elevated 3.21 (baseline 1.03 on 10/20) -- Cr 3.21>1.75 -- Advance diet to soft -- Holding home antihypertensives -- LR at 125 mL/h -- Evaluation for esophageal stricture as below -- BMP in  a.m.  Hypokalemia Repleted. -- Repeat electrolytes in a.m.  History of esophageal stricture Patient reports previous history of esophageal stricture, previously required stretching by her GI physician, at [facility] clinic. -- Upper GI series: Pending -- SLP evaluation -- Soft diet -- May need GI involvement if concerning findings from UGI series/SLP eval  HTN Patient was notably hypotensive on admission, likely secondary to volume depletion.  At baseline on atenolol  50 mg p.o. daily, amlodipine  10 mg p.o. daily. -- Hold antihypertensives for now -- Continue monitor BP closely  DM2 On insulin  pump at baseline.  Hemoglobin A1c -- Diabetic educator following -- Continue off of insulin  pump -- Very sensitive SSI for coverage -- CBG every 4 hours  Bipolar disorder -- BuSpar  50 mg p.o. 3 times daily -- Lexapro  500 p.o. every morning -- Latuda  80 mg p.o. nightly -- Topamax  20 mg p.o. nightly --Amitriptyline  150 Thomson p.o. nightly  GERD --Protonix  40 mg p.o. daily  OSA Continue nocturnal CPAP  Obesity, class I Body mass index is 31.48 kg/m.    DVT prophylaxis: enoxaparin  (LOVENOX ) injection 40 mg Start: 10/24/24 2200    Code Status: Full Code Family Communication: No family present at bedside  Disposition Plan:  Level of care: Telemetry Status is: Inpatient Remains inpatient appropriate because: IV fluid hydration, pending upper GI series, SLP evaluation    Consultants:  None  Procedures:  Upper GI series: Pending  Antimicrobials:  None   Subjective: Patient seen examined bedside, lying in bed.  Tolerating clear liquid diet, wishes further advancement.  Creatinine improved.  Discussed further workup regarding history of  esophageal stricture given recurrent intractable nausea and vomiting.  Remains on IV fluids.  No other questions or concerns at this time.  Denies headache, no dizziness, no chest pain, no palpitations, no shortness of breath, no abdominal  pain, no fever/chills/night sweats, no nausea/vomiting/diarrhea, no focal weakness, no fatigue, no paresthesia.  No acute events overnight per nursing.  Objective: Vitals:   10/24/24 0126 10/24/24 0524 10/24/24 0826 10/24/24 1232  BP: (!) 92/54 97/63 119/74 107/75  Pulse: 81 77 78 80  Resp: 17 12 18 18   Temp: 98.4 F (36.9 C) 98.5 F (36.9 C) 98.6 F (37 C) 98.4 F (36.9 C)  TempSrc: Oral Oral Oral Oral  SpO2: 100% 96% 98% 99%  Weight:      Height:        Intake/Output Summary (Last 24 hours) at 10/24/2024 1251 Last data filed at 10/24/2024 1143 Gross per 24 hour  Intake 1240 ml  Output --  Net 1240 ml   Filed Weights   10/23/24 1445 10/23/24 2059  Weight: 75 kg 78.1 kg    Examination:  Physical Exam: GEN: NAD, alert and oriented x 3, obese HEENT: NCAT, PERRL, EOMI, sclera clear, MMM PULM: CTAB w/o wheezes/crackles, normal respiratory effort, on room air CV: RRR w/o M/G/R GI: abd soft, NTND, + BS MSK: no peripheral edema, moves all extremities independently NEURO: No focal neurological deficit PSYCH: normal mood/affect Integumentary: No concerning rashes/lesions/wounds noted on exposed skin surfaces    Data Reviewed: I have personally reviewed following labs and imaging studies  CBC: Recent Labs  Lab 10/23/24 1558 10/24/24 0447  WBC 11.1* 8.7  NEUTROABS 7.4  --   HGB 14.6 11.6*  HCT 44.7 37.0  MCV 81.1 81.9  PLT 250 194   Basic Metabolic Panel: Recent Labs  Lab 10/18/24 0436 10/23/24 1558 10/24/24 0447  NA 136 134* 135  K 4.0 3.3* 3.6  CL 105 95* 105  CO2 21* 22 21*  GLUCOSE 229* 185* 127*  BUN 11 31* 27*  CREATININE 1.03* 3.21* 1.75*  CALCIUM  9.0 9.8 8.3*  MG 2.0  --  1.8   GFR: Estimated Creatinine Clearance: 36.4 mL/min (A) (by C-G formula based on SCr of 1.75 mg/dL (H)). Liver Function Tests: Recent Labs  Lab 10/23/24 1558  AST 21  ALT 21  ALKPHOS 102  BILITOT 0.5  PROT 8.1  ALBUMIN 4.5   Recent Labs  Lab 10/23/24 1559   LIPASE 32   No results for input(s): AMMONIA in the last 168 hours. Coagulation Profile: No results for input(s): INR, PROTIME in the last 168 hours. Cardiac Enzymes: No results for input(s): CKTOTAL, CKMB, CKMBINDEX, TROPONINI in the last 168 hours. BNP (last 3 results) No results for input(s): PROBNP in the last 8760 hours. HbA1C: No results for input(s): HGBA1C in the last 72 hours. CBG: Recent Labs  Lab 10/18/24 0740 10/18/24 1128 10/23/24 2123 10/24/24 0721 10/24/24 1104  GLUCAP 238* 176* 177* 117* 164*   Lipid Profile: No results for input(s): CHOL, HDL, LDLCALC, TRIG, CHOLHDL, LDLDIRECT in the last 72 hours. Thyroid  Function Tests: No results for input(s): TSH, T4TOTAL, FREET4, T3FREE, THYROIDAB in the last 72 hours. Anemia Panel: No results for input(s): VITAMINB12, FOLATE, FERRITIN, TIBC, IRON, RETICCTPCT in the last 72 hours. Sepsis Labs: No results for input(s): PROCALCITON, LATICACIDVEN in the last 168 hours.  Recent Results (from the past 240 hours)  Blood Culture (routine x 2)     Status: None   Collection Time: 10/15/24  5:38 PM  Specimen: BLOOD LEFT WRIST  Result Value Ref Range Status   Specimen Description BLOOD LEFT WRIST  Final   Special Requests   Final    BOTTLES DRAWN AEROBIC AND ANAEROBIC Blood Culture adequate volume   Culture   Final    NO GROWTH 5 DAYS Performed at [facility], [address] [place], [place] [address]    Report Status 10/20/2024 FINAL  Final  Resp panel by RT-PCR (RSV, Flu A&B, Covid) Anterior Nasal Swab     Status: None   Collection Time: 10/15/24  6:36 PM   Specimen: Anterior Nasal Swab  Result Value Ref Range Status   SARS Coronavirus 2 by RT PCR NEGATIVE NEGATIVE Final    Comment: (NOTE) SARS-CoV-2 target nucleic acids are NOT DETECTED.  The SARS-CoV-2 RNA is generally detectable in upper respiratory specimens during the acute phase of infection. The  lowest concentration of SARS-CoV-2 viral copies this assay can detect is 138 copies/mL. A negative result does not preclude SARS-Cov-2 infection and should not be used as the sole basis for treatment or other patient management decisions. A negative result may occur with  improper specimen collection/handling, submission of specimen other than nasopharyngeal swab, presence of viral mutation(s) within the areas targeted by this assay, and inadequate number of viral copies(<138 copies/mL). A negative result must be combined with clinical observations, patient history, and epidemiological information. The expected result is Negative.  Fact Sheet for Patients:  bloggercourse.com  Fact Sheet for Healthcare Providers:  seriousbroker.it  This test is no t yet approved or cleared by the [place]  FDA and  has been authorized for detection and/or diagnosis of SARS-CoV-2 by FDA under an Emergency Use Authorization (EUA). This EUA will remain  in effect (meaning this test can be used) for the duration of the COVID-19 declaration under Section 564(b)(1) of the Act, 21 U.S.C.section 360bbb-3(b)(1), unless the authorization is terminated  or revoked sooner.       Influenza A by PCR NEGATIVE NEGATIVE Final   Influenza B by PCR NEGATIVE NEGATIVE Final    Comment: (NOTE) The Xpert Xpress SARS-CoV-2/FLU/RSV plus assay is intended as an aid in the diagnosis of influenza from Nasopharyngeal swab specimens and should not be used as a sole basis for treatment. Nasal washings and aspirates are unacceptable for Xpert Xpress SARS-CoV-2/FLU/RSV testing.  Fact Sheet for Patients: bloggercourse.com  Fact Sheet for Healthcare Providers: seriousbroker.it  This test is not yet approved or cleared by the [place]  FDA and has been authorized for detection and/or diagnosis of SARS-CoV-2 by FDA under  an Emergency Use Authorization (EUA). This EUA will remain in effect (meaning this test can be used) for the duration of the COVID-19 declaration under Section 564(b)(1) of the Act, 21 U.S.C. section 360bbb-3(b)(1), unless the authorization is terminated or revoked.     Resp Syncytial Virus by PCR NEGATIVE NEGATIVE Final    Comment: (NOTE) Fact Sheet for Patients: bloggercourse.com  Fact Sheet for Healthcare Providers: seriousbroker.it  This test is not yet approved or cleared by the [place]  FDA and has been authorized for detection and/or diagnosis of SARS-CoV-2 by FDA under an Emergency Use Authorization (EUA). This EUA will remain in effect (meaning this test can be used) for the duration of the COVID-19 declaration under Section 564(b)(1) of the Act, 21 U.S.C. section 360bbb-3(b)(1), unless the authorization is terminated or revoked.  Performed at [facility] [place], [address], [place], [place] [address]   Urine Culture  Status: Abnormal   Collection Time: 10/16/24 12:43 AM   Specimen: Urine, Random  Result Value Ref Range Status   Specimen Description   Final    URINE, RANDOM Performed at [facility], [address], [place], [place] [address]    Special Requests   Final    NONE Reflexed from [id number] Performed at [facility], [address], [place], [place] [address]    Culture (A)  Final    <10,000 COLONIES/mL INSIGNIFICANT GROWTH Performed at [facility], [address] [place], [place] [address]    Report Status 10/17/2024 FINAL  Final  Blood Culture (routine x 2)     Status: None   Collection Time: 10/16/24  3:57 PM   Specimen: BLOOD RIGHT HAND  Result Value Ref Range Status   Specimen Description   Final    BLOOD RIGHT HAND BOTTLES DRAWN AEROBIC ONLY Performed at [facility], [address], [place], [place]  [address]    Special Requests   Final    Blood Culture results may not be optimal due to an inadequate volume of blood received in culture bottles Performed at [facility], [address], [place], [place] [address]    Culture   Final    NO GROWTH 5 DAYS Performed at [facility], [address], [place], [place] [address]    Report Status 10/21/2024 FINAL  Final         Radiology Studies: No results found.      Scheduled Meds:  amitriptyline   150 mg Oral QHS   baclofen   10 mg Oral TID   busPIRone   15 mg Oral TID   enoxaparin  (LOVENOX ) injection  40 mg Subcutaneous Q24H   escitalopram   5 mg Oral q morning   insulin  aspart  0-6 Units Subcutaneous Q4H   lurasidone   80 mg Oral QPM   pantoprazole   40 mg Oral Daily   senna  1 tablet Oral QHS   topiramate   200 mg Oral QHS   Continuous Infusions:  lactated ringers        LOS: 1 day    Time spent: 52 minutes spent on 10/24/2024 caring for this patient face-to-face including chart review, ordering labs/tests, documenting, discussion with nursing staff, consultants, updating family and interview/physical exam    Camellia PARAS Sadira Standard, DO [facility] Available via Epic secure chat 7am-7pm After these hours, please refer to coverage provider listed on [email or web address] 10/24/2024, 12:51 PM

## 2024-10-24 NOTE — Progress Notes (Signed)
   10/24/24 2050  BiPAP/CPAP/SIPAP  BiPAP/CPAP/SIPAP Pt Type Adult  Reason BIPAP/CPAP not in use Non-compliant (Patient does not want to wear a machine or mask as it is hard to tolerate. Pt made aware to call RT if she changes her mind.)  BiPAP/CPAP /SiPAP Vitals  Resp 14  MEWS Score/Color  MEWS Score 0  MEWS Score Color Landy

## 2024-10-24 NOTE — Inpatient Diabetes Management (Addendum)
 Inpatient Diabetes Program Recommendations  AACE/ADA: New Consensus Statement on Inpatient Glycemic Control   Target Ranges:  Prepandial:   less than 140 mg/dL      Peak postprandial:   less than 180 mg/dL (1-2 hours)      Critically ill patients:  140 - 180 mg/dL    Latest Reference Range & Units 10/23/24 21:23 10/24/24 07:21  Glucose-Capillary 70 - 99 mg/dL 822 (H) 882 (H)    Latest Reference Range & Units 10/23/24 15:58 10/24/24 04:47  Glucose 70 - 99 mg/dL 814 (H) 872 (H)   Review of Glycemic Control  Diabetes history: DM2 Outpatient Diabetes medications: OmniPod insulin  pump with Humalog insulin  (managed by daughter-in-law), Dexcom G7 CGM, Ozempic Qweek (pt not restarted per H&P) Current orders for Inpatient glycemic control: Novolog  0-6 units TID with meals  Inpatient Diabetes Program Recommendations:    Insulin : Recommend that patient not use the insulin  pump while inpatient and DM be controlled with SQ insulin  regimen. If patient has insulin  pump on, please have patient remove the insulin  pump.  Please consider changing CBGs to Q4H and Novolog  0-6 units to Q4H.  If CBGs become consistently over 180 mg/dl with Novolog  correction, please consider ordering Semglee  5 units Q24H.  NOTE: Noted consult for Diabetes Coordinator. Diabetes Coordinator is not on campus over the weekend but available by pager from 8am to 5pm for questions or concerns.   Patient with DM2 hx that uses an insulin  pump outpatient admitted 10/23/24 with intractable vomiting, acute kidney injury, and dehydration. Patient was recently inpatient 10/15/24-10/18/24 and was seen by inpatient diabetes coordinator on 10/18/24. Per note on 10/18/24 by FABIENE Brandy, RD, Diabetes Coordinator, patient's insulin  pump is managed by her daughter in law, patient sees Dr. Tommas (Endocrinologist) and was last seen on 07/06/24. Would recommend that patient not use the insulin  pump while inpatient and DM be controlled with SQ insulin   regimen. Sent chat message to [employer], RN to ask if patient has insulin  pump on at this time.  Thanks, Earnie Gainer, RN, MSN, CDCES Diabetes Coordinator Inpatient Diabetes Program [phone] (Team Pager from 8am to 5pm)

## 2024-10-25 ENCOUNTER — Encounter (HOSPITAL_COMMUNITY): Payer: Self-pay | Admitting: Internal Medicine

## 2024-10-25 ENCOUNTER — Inpatient Hospital Stay (HOSPITAL_COMMUNITY)

## 2024-10-25 DIAGNOSIS — N179 Acute kidney failure, unspecified: Secondary | ICD-10-CM | POA: Diagnosis not present

## 2024-10-25 LAB — CBC
HCT: 35.4 % — ABNORMAL LOW (ref 36.0–46.0)
Hemoglobin: 11.2 g/dL — ABNORMAL LOW (ref 12.0–15.0)
MCH: 26.5 pg (ref 26.0–34.0)
MCHC: 31.6 g/dL (ref 30.0–36.0)
MCV: 83.7 fL (ref 80.0–100.0)
Platelets: 198 K/uL (ref 150–400)
RBC: 4.23 MIL/uL (ref 3.87–5.11)
RDW: 15.4 % (ref 11.5–15.5)
WBC: 5.8 K/uL (ref 4.0–10.5)
nRBC: 0 % (ref 0.0–0.2)

## 2024-10-25 LAB — GLUCOSE, CAPILLARY
Glucose-Capillary: 215 mg/dL — ABNORMAL HIGH (ref 70–99)
Glucose-Capillary: 211 mg/dL — ABNORMAL HIGH (ref 70–99)
Glucose-Capillary: 168 mg/dL — ABNORMAL HIGH (ref 70–99)
Glucose-Capillary: 259 mg/dL — ABNORMAL HIGH (ref 70–99)
Glucose-Capillary: 148 mg/dL — ABNORMAL HIGH (ref 70–99)
Glucose-Capillary: 232 mg/dL — ABNORMAL HIGH (ref 70–99)

## 2024-10-25 LAB — BASIC METABOLIC PANEL WITH GFR
Anion gap: 10 (ref 5–15)
BUN: 16 mg/dL (ref 6–20)
CO2: 22 mmol/L (ref 22–32)
Calcium: 8.9 mg/dL (ref 8.9–10.3)
Chloride: 108 mmol/L (ref 98–111)
Creatinine, Ser: 1.28 mg/dL — ABNORMAL HIGH (ref 0.44–1.00)
GFR, Estimated: 50 mL/min — ABNORMAL LOW
Glucose, Bld: 229 mg/dL — ABNORMAL HIGH (ref 70–99)
Potassium: 3.9 mmol/L (ref 3.5–5.1)
Sodium: 140 mmol/L (ref 135–145)

## 2024-10-25 LAB — MAGNESIUM: Magnesium: 1.8 mg/dL (ref 1.7–2.4)

## 2024-10-25 MED ORDER — AMLODIPINE BESYLATE 10 MG PO TABS
10.0000 mg | ORAL_TABLET | Freq: Every day | ORAL | Status: DC
  Administered 2024-10-25 – 2024-10-27 (×3): 10 mg via ORAL
  Filled 2024-10-25 (×3): qty 1

## 2024-10-25 MED ORDER — ATENOLOL 50 MG PO TABS
50.0000 mg | ORAL_TABLET | Freq: Every day | ORAL | Status: DC
  Administered 2024-10-25 – 2024-10-27 (×3): 50 mg via ORAL
  Filled 2024-10-25 (×3): qty 1

## 2024-10-25 MED ORDER — LACTATED RINGERS IV SOLN: INTRAVENOUS | Status: AC

## 2024-10-25 MED ORDER — INSULIN GLARGINE-YFGN 100 UNIT/ML ~~LOC~~ SOLN
5.0000 [IU] | Freq: Every day | SUBCUTANEOUS | Status: DC
  Administered 2024-10-25: 5 [IU] via SUBCUTANEOUS
  Filled 2024-10-25 (×2): qty 0.05

## 2024-10-25 NOTE — Progress Notes (Signed)
 PROGRESS NOTE    Stephanie Thornton  FMW:[id number] DOB: 14-Sep-1971 DOA: 10/23/2024 PCP: Austin Mutton, MD    Brief Narrative:   Stephanie Thornton is a 53 y.o. female with past medical history significant for HTN, DM2, bipolar disorder, CVA, history of esophageal stricture, OSA on CPAP who presented to [facility] ED on 10/23/2024 with dizziness, recurrent nausea with intractable vomiting.  Recently discharged for same 5 days prior.  Upon discharge he was feeling well and able to eat without issue.  Two days ago in the evening she went out to eat and subsequently started vomiting once again.  Feels that the presenting symptoms are similar to the past except for dizziness was worse this time.  Upon EMS arrival, she was noted to be hypotensive with a systolic blood pressure in the 60s.  Was given IV fluids with improvement.  Patient was transported to the ED for further evaluation and management.  In the ED, temperature 97.4 F, HR 82, RR 18, BP 93/63, SpO2 96% on room air.  WBC 11.1, hemoglobin 14.6, platelet count 250.  Sodium 134, potassium 3.3, chloride 95, CO2 22, glucose 185, BUN 31, creatinine 3.21.  Lipase 32.  AST 21, ALT 21, total bilirubin 0.5.  Urinalysis with moderate leukocytes, negative nitrite, no bacteria, 0-5 WBCs.  Patient received 2 L NS bolus in the ED.  TRH consulted for admission for further evaluation management of intractable nausea/vomiting, acute renal failure  Assessment & Plan:   Acute renal failure Patient presenting with recurrent intractable nausea/vomiting.  Reports history of esophageal stricture requiring balloon dilation.  Patient is afebrile.  Lipase within normal limits.  Total bilirubin normal limits.  Urinalysis unrevealing.  Creatinine elevated 3.21 (baseline 1.03 on 10/20) -- Cr 3.21>1.75>1.28 -- Soft diet -- Holding home antihypertensives -- LR at 125 mL/h -- BMP in a.m.  Hypokalemia Repleted. -- Repeat electrolytes in a.m.  History of esophageal  stricture Patient reports previous history of esophageal stricture, previously required stretching by her GI physician, at [facility].  Esophagram with noted recurrent stricturing of the GE junction, severe esophageal dysmotility.  Seen by SLP -- GI consulted for evaluation and consideration of EGD with balloon dilation  HTN Patient was notably hypotensive on admission, likely secondary to volume depletion.  At baseline on atenolol  50 mg p.o. daily, amlodipine  10 mg p.o. daily. -- Hold antihypertensives for now -- Continue monitor BP closely  DM2 On insulin  pump at baseline.  Hemoglobin A1c 9.9 on 07/18/2024, poorly controlled. -- Diabetic educator following -- Continue off of insulin  pump -- Semglee  5 u Hiddenite daily -- Very sensitive SSI for coverage -- CBG every 4 hours  Bipolar disorder -- BuSpar  50 mg p.o. 3 times daily -- Lexapro  500 p.o. every morning -- Latuda  80 mg p.o. nightly -- Topamax  20 mg p.o. nightly -- Amitriptyline  150 Thomson p.o. nightly  GERD -- Protonix  40 mg p.o. daily  OSA Continue nocturnal CPAP  Obesity, class I Body mass index is 31.48 kg/m.    DVT prophylaxis: enoxaparin  (LOVENOX ) injection 40 mg Start: 10/24/24 2200    Code Status: Full Code Family Communication: No family present at bedside  Disposition Plan:  Level of care: Telemetry Status is: Inpatient Remains inpatient appropriate because: Pending GI evaluation    Consultants:  [facility]  Procedures:  Upper GI series: + Esophageal stricture  Antimicrobials:  None   Subjective: Patient seen examined bedside, lying in bed.  Currently tolerating diet per RN.  Seen by speech therapy, esophagram this  morning showing distal esophageal stricture.  Consulting GI for consideration of EGD with balloon dilation as patient has required this in the past.  Previously seen by [facility].   No other questions or concerns at this time.  Denies headache, no dizziness, no chest pain,  no palpitations, no shortness of breath, no abdominal pain, no fever/chills/night sweats, no nausea/vomiting/diarrhea, no focal weakness, no fatigue, no paresthesia.  No acute events overnight per nursing.  Objective: Vitals:   10/24/24 1232 10/24/24 1938 10/24/24 2050 10/25/24 0358  BP: 107/75 (!) 151/83  (!) 152/91  Pulse: 80 87  89  Resp: 18 17 14 17   Temp: 98.4 F (36.9 C) 98.6 F (37 C)  98.3 F (36.8 C)  TempSrc: Oral Oral  Oral  SpO2: 99% 100%  97%  Weight:      Height:        Intake/Output Summary (Last 24 hours) at 10/25/2024 1243 Last data filed at 10/25/2024 0543 Gross per 24 hour  Intake 1262.5 ml  Output --  Net 1262.5 ml   Filed Weights   10/23/24 1445 10/23/24 2059  Weight: 75 kg 78.1 kg    Examination:  Physical Exam: GEN: NAD, alert and oriented x 3, obese HEENT: NCAT, PERRL, EOMI, sclera clear, MMM PULM: CTAB w/o wheezes/crackles, normal respiratory effort, on room air CV: RRR w/o M/G/R GI: abd soft, NTND, + BS MSK: no peripheral edema, moves all extremities independently NEURO: No focal neurological deficit PSYCH: normal mood/affect Integumentary: No concerning rashes/lesions/wounds noted on exposed skin surfaces    Data Reviewed: I have personally reviewed following labs and imaging studies  CBC: Recent Labs  Lab 10/23/24 1558 10/24/24 0447 10/25/24 0344  WBC 11.1* 8.7 5.8  NEUTROABS 7.4  --   --   HGB 14.6 11.6* 11.2*  HCT 44.7 37.0 35.4*  MCV 81.1 81.9 83.7  PLT 250 194 198   Basic Metabolic Panel: Recent Labs  Lab 10/23/24 1558 10/24/24 0447 10/25/24 0344  NA 134* 135 140  K 3.3* 3.6 3.9  CL 95* 105 108  CO2 22 21* 22  GLUCOSE 185* 127* 229*  BUN 31* 27* 16  CREATININE 3.21* 1.75* 1.28*  CALCIUM  9.8 8.3* 8.9  MG  --  1.8 1.8   GFR: Estimated Creatinine Clearance: 49.8 mL/min (A) (by C-G formula based on SCr of 1.28 mg/dL (H)). Liver Function Tests: Recent Labs  Lab 10/23/24 1558  AST 21  ALT 21  ALKPHOS 102   BILITOT 0.5  PROT 8.1  ALBUMIN 4.5   Recent Labs  Lab 10/23/24 1559  LIPASE 32   No results for input(s): AMMONIA in the last 168 hours. Coagulation Profile: No results for input(s): INR, PROTIME in the last 168 hours. Cardiac Enzymes: No results for input(s): CKTOTAL, CKMB, CKMBINDEX, TROPONINI in the last 168 hours. BNP (last 3 results) No results for input(s): PROBNP in the last 8760 hours. HbA1C: No results for input(s): HGBA1C in the last 72 hours. CBG: Recent Labs  Lab 10/24/24 2006 10/25/24 0134 10/25/24 0356 10/25/24 0727 10/25/24 1114  GLUCAP 205* 215* 211* 168* 259*   Lipid Profile: No results for input(s): CHOL, HDL, LDLCALC, TRIG, CHOLHDL, LDLDIRECT in the last 72 hours. Thyroid  Function Tests: No results for input(s): TSH, T4TOTAL, FREET4, T3FREE, THYROIDAB in the last 72 hours. Anemia Panel: No results for input(s): VITAMINB12, FOLATE, FERRITIN, TIBC, IRON, RETICCTPCT in the last 72 hours. Sepsis Labs: No results for input(s): PROCALCITON, LATICACIDVEN in the last 168 hours.  Recent Results (from  the past 240 hours)  Blood Culture (routine x 2)     Status: None   Collection Time: 10/15/24  5:38 PM   Specimen: BLOOD LEFT WRIST  Result Value Ref Range Status   Specimen Description BLOOD LEFT WRIST  Final   Special Requests   Final    BOTTLES DRAWN AEROBIC AND ANAEROBIC Blood Culture adequate volume   Culture   Final    NO GROWTH 5 DAYS Performed at [facility], [address], [place], [place] [address]    Report Status 10/20/2024 FINAL  Final  Resp panel by RT-PCR (RSV, Flu A&B, Covid) Anterior Nasal Swab     Status: None   Collection Time: 10/15/24  6:36 PM   Specimen: Anterior Nasal Swab  Result Value Ref Range Status   SARS Coronavirus 2 by RT PCR NEGATIVE NEGATIVE Final    Comment: (NOTE) SARS-CoV-2 target nucleic acids are NOT DETECTED.  The SARS-CoV-2 RNA is generally  detectable in upper respiratory specimens during the acute phase of infection. The lowest concentration of SARS-CoV-2 viral copies this assay can detect is 138 copies/mL. A negative result does not preclude SARS-Cov-2 infection and should not be used as the sole basis for treatment or other patient management decisions. A negative result may occur with  improper specimen collection/handling, submission of specimen other than nasopharyngeal swab, presence of viral mutation(s) within the areas targeted by this assay, and inadequate number of viral copies(<138 copies/mL). A negative result must be combined with clinical observations, patient history, and epidemiological information. The expected result is Negative.  Fact Sheet for Patients:  bloggercourse.com  Fact Sheet for Healthcare Providers:  seriousbroker.it  This test is no t yet approved or cleared by the [place]  FDA and  has been authorized for detection and/or diagnosis of SARS-CoV-2 by FDA under an Emergency Use Authorization (EUA). This EUA will remain  in effect (meaning this test can be used) for the duration of the COVID-19 declaration under Section 564(b)(1) of the Act, 21 U.S.C.section 360bbb-3(b)(1), unless the authorization is terminated  or revoked sooner.       Influenza A by PCR NEGATIVE NEGATIVE Final   Influenza B by PCR NEGATIVE NEGATIVE Final    Comment: (NOTE) The Xpert Xpress SARS-CoV-2/FLU/RSV plus assay is intended as an aid in the diagnosis of influenza from Nasopharyngeal swab specimens and should not be used as a sole basis for treatment. Nasal washings and aspirates are unacceptable for Xpert Xpress SARS-CoV-2/FLU/RSV testing.  Fact Sheet for Patients: bloggercourse.com  Fact Sheet for Healthcare Providers: seriousbroker.it  This test is not yet approved or cleared by the [place]  FDA  and has been authorized for detection and/or diagnosis of SARS-CoV-2 by FDA under an Emergency Use Authorization (EUA). This EUA will remain in effect (meaning this test can be used) for the duration of the COVID-19 declaration under Section 564(b)(1) of the Act, 21 U.S.C. section 360bbb-3(b)(1), unless the authorization is terminated or revoked.     Resp Syncytial Virus by PCR NEGATIVE NEGATIVE Final    Comment: (NOTE) Fact Sheet for Patients: bloggercourse.com  Fact Sheet for Healthcare Providers: seriousbroker.it  This test is not yet approved or cleared by the [place]  FDA and has been authorized for detection and/or diagnosis of SARS-CoV-2 by FDA under an Emergency Use Authorization (EUA). This EUA will remain in effect (meaning this test can be used) for the duration of the COVID-19 declaration under Section 564(b)(1) of the Act, 21 U.S.C. section 360bbb-3(b)(1), unless the  authorization is terminated or revoked.  Performed at [facility], [address] [place], [place] [address]   Urine Culture     Status: Abnormal   Collection Time: 10/16/24 12:43 AM   Specimen: Urine, Random  Result Value Ref Range Status   Specimen Description   Final    URINE, RANDOM Performed at [facility], [address], [place], [place] [address]    Special Requests   Final    NONE Reflexed from [id number] Performed at [facility], [address], [place], [place] [address]    Culture (A)  Final    <10,000 COLONIES/mL INSIGNIFICANT GROWTH Performed at [facility], [address], [place], [place] [address]    Report Status 10/17/2024 FINAL  Final  Blood Culture (routine x 2)     Status: None   Collection Time: 10/16/24  3:57 PM   Specimen: BLOOD RIGHT HAND  Result Value Ref Range Status   Specimen Description   Final    BLOOD RIGHT HAND BOTTLES DRAWN AEROBIC  ONLY Performed at [facility], [address] [place], [place] [address]    Special Requests   Final    Blood Culture results may not be optimal due to an inadequate volume of blood received in culture bottles Performed at [facility], [address] [place], [place] [address]    Culture   Final    NO GROWTH 5 DAYS Performed at [facility], [address], [place], [place] [address]    Report Status 10/21/2024 FINAL  Final         Radiology Studies: DG ESOPHAGUS W SINGLE CM (SOL OR THIN BA) Result Date: 10/25/2024 EXAM: SINGLE CONTRAST ESOPHAGRAM 10/25/2024 10:18:00 AM TECHNIQUE: Multiple single contrast images of the esophagus and gastroesophageal junction were obtained following the oral administration of water soluble contrast FLUOROSCOPY DOSE AND TYPE: Radiation Dose Index: Reference Air Kerma (in mGy) = 67.0 Fluoroscopy time: 3 min 12 sec COMPARISON: None available. CLINICAL HISTORY: Vomiting and sometimes choking when eating/drinking. FINDINGS: Contrast flows rapidly through the esophagus into the stomach through a patent GE junction. No mucosal irregularity identified in the thoracic esophagus or distal esophagus. There was severe esophageal dysmotility with no initiation of the primary stripping wave in supine position and poor initiation in upright position, presumably related to the distal stricture. A 13 mm barium tablet failed to pass the GE junction, suggesting recurrent stricturing of the GE junction. No evidence of leak. No hiatal hernia is seen and there is no evidence of achalasia. No gastroesophageal reflux was elicited during examination. IMPRESSION: 1. 13 mm barium tablet failed to pass the GE junction, suggesting recurrent stricturing of the GE junction. 2. Severe esophageal dysmotility with poor initiation of the primary stripping wave, presumably related to the distal stricture. Electronically signed by: Norleen Boxer MD  10/25/2024 10:55 AM EDT RP Workstation: [id number]        Scheduled Meds:  amitriptyline   150 mg Oral QHS   baclofen   10 mg Oral TID   busPIRone   15 mg Oral TID   enoxaparin  (LOVENOX ) injection  40 mg Subcutaneous Q24H   escitalopram   5 mg Oral q morning   insulin  aspart  0-6 Units Subcutaneous Q4H   insulin  glargine-yfgn  5 Units Subcutaneous Daily   lurasidone   80 mg Oral QPM   pantoprazole   40 mg Oral Daily   senna  1 tablet Oral QHS   topiramate   200 mg Oral QHS   Continuous Infusions:  lactated ringers  125 mL/hr at 10/25/24 1058     LOS: 2 days    Time spent: 48 minutes spent on 10/25/2024 caring for this patient face-to-face including chart review, ordering labs/tests, documenting, discussion with nursing staff, consultants, updating family and interview/physical exam    Camellia PARAS Samarra Ridgely, DO [facility] Available via Epic secure chat 7am-7pm After these hours, please refer to coverage provider listed on [email or web address] 10/25/2024, 12:43 PM

## 2024-10-25 NOTE — Evaluation (Addendum)
 Clinical/Bedside Swallow Evaluation Patient Details  Name: Stephanie Thornton MRN: [id number] Date of Birth: [birth year]  Today's Date: 10/25/2024 Time: SLP Start Time (ACUTE ONLY): 0905 SLP Stop Time (ACUTE ONLY): 9081 SLP Time Calculation (min) (ACUTE ONLY): 13 min  Past Medical History:  Past Medical History:  Diagnosis Date   Anxiety    Arthritis    knees, feet, back (05/24/2015)   Benign essential HTN 05/01/2015   Bipolar disorder (HCC) diagnosed early 90s   CAD in native artery    Chronic bronchitis (HCC)    get it q yr   Chronic lower back pain    Depression    Diabetes mellitus type II 2010   GERD (gastroesophageal reflux disease)    Headache    maybe 3 times/wk (05/24/2015)   History of hiatal hernia    History of stomach ulcers    HLD (hyperlipidemia)    Menopause    Migraine    1-2/wk (05/24/2015)   NSTEMI (non-ST elevated myocardial infarction) (HCC) 04/2022   OSA on CPAP    Stroke (HCC)    r sided weakness   Past Surgical History:  Past Surgical History:  Procedure Laterality Date   ABDOMINAL HYSTERECTOMY  2007   BUNIONECTOMY Bilateral    CARPAL TUNNEL RELEASE Right    CORONARY STENT INTERVENTION N/A 05/07/2022   Procedure: CORONARY STENT INTERVENTION;  Surgeon: Claudene Victory ORN, MD;  Location: [facility] INVASIVE CV LAB;  Service: Cardiovascular;  Laterality: N/A;   HAMMER TOE SURGERY Left    LEFT HEART CATH AND CORONARY ANGIOGRAPHY N/A 05/20/2018   Procedure: LEFT HEART CATH AND CORONARY ANGIOGRAPHY;  Surgeon: Anner Alm ORN, MD;  Location: [facility] INVASIVE CV LAB;  Service: Cardiovascular;  Laterality: N/A;   LEFT HEART CATH AND CORONARY ANGIOGRAPHY N/A 05/07/2022   Procedure: LEFT HEART CATH AND CORONARY ANGIOGRAPHY;  Surgeon: Claudene Victory ORN, MD;  Location: [facility] INVASIVE CV LAB;  Service: Cardiovascular;  Laterality: N/A;   TONSILLECTOMY Bilateral 05/24/2015   Procedure: TONSILLECTOMY;  Surgeon: Vaughan Ricker, MD;  Location: [facility] OR;  Service: ENT;  Laterality:  Bilateral;   HPI:  Patient is a 53 y.o. female with medical history significant for type 2 diabetes mellitus, OSA on CPAP, hypertension, history of CVA, GERD, esophageal stricture, type 2 diabetes mellitus, and bipolar disorder who presents to hospital on 10/23/24 because of worsening dizziness and intractable vomiting. The patient was admitted for the same thing on 10/15/24. She reports that she was discharged feeling good. She has had esophageal strictures that have been stretched in the past and she feels like her food is getting caught. She reports that she sometimes vomits up undigested recognizable food. Bedside swallow evaluation ordered to assess patient's swallow.    Assessment / Plan / Recommendation  Clinical Impression  Patient is not currently presenting with clinical s/s of dysphagia as per this BSE. She was awake, alert, and participated fully in this evaluation. She was eating a sandwhich upon SLP entrance. Patient reported that she feels like food and pills are getting caught. She has been feeling this for a while now. Patient shared that she commonly coughs during meals. SLP assessed patient's swallow via PO's of: thin liquids, puree solids, and mechanical soft solids. Swallow initiation was timely and no overt s/s of aspiration observed with straw sips of thin liquids, bites of purees or mechanical soft solids. Patient has an upcoming esophagram scheduled. SLP check back on results of esophagram to determine if further need.  SLP Visit Diagnosis: Dysphagia, unspecified (  R13.10)    Aspiration Risk       Diet Recommendation Regular;Thin liquid    Liquid Administration via: Spoon;Cup;Straw Medication Administration: Other (Comment) (as tolerated) Supervision: Patient able to self feed Compensations: Slow rate;Small sips/bites Postural Changes: Remain upright for at least 30 minutes after po intake;Seated upright at 90 degrees    Other  Recommendations Oral Care Recommendations:  Oral care BID     Assistance Recommended at Discharge    Functional Status Assessment Patient has had a recent decline in their functional status and demonstrates the ability to make significant improvements in function in a reasonable and predictable amount of time.  Frequency and Duration            Prognosis        Swallow Study   General Date of Onset: 10/25/24 HPI: Patient is a 53 y.o. female with medical history significant for type 2 diabetes mellitus, OSA on CPAP, hypertension, history of CVA, GERD, esophageal stricture, type 2 diabetes mellitus, and bipolar disorder who presents to hospital on 10/23/24 because of worsening dizziness and intractable vomiting. The patient was admitted for the same thing on 10/15/24. She reports that she was discharged feeling good. She has had esophageal strictures that have been stretched in the past and she feels like her food is getting caught. She reports that she sometimes vomits up undigested recognizable food. Bedside swallow evaluation ordered to assess patient's swallow. Type of Study: Bedside Swallow Evaluation Diet Prior to this Study: Regular;Thin liquids (Level 0) Temperature Spikes Noted: No Respiratory Status: Room air History of Recent Intubation: No Behavior/Cognition: Alert;Cooperative;Pleasant mood Oral Cavity Assessment: Within Functional Limits Oral Care Completed by SLP: No Vision: Functional for self-feeding Self-Feeding Abilities: Able to feed self Patient Positioning: Upright in bed    Oral/Motor/Sensory Function Overall Oral Motor/Sensory Function: Within functional limits   Ice Chips     Thin Liquid Thin Liquid: Within functional limits Presentation: Straw;Self Fed    Nectar Thick     Honey Thick     Puree Puree: Within functional limits Presentation: Self Fed;Spoon   Solid     Solid: Within functional limits Presentation: Self Fed     Stephanie Thornton  Graduate SLP Clinican

## 2024-10-25 NOTE — Progress Notes (Signed)
   10/25/24 1541  TOC Brief Assessment  Insurance and Status Reviewed  Patient has primary care physician Yes  Home environment has been reviewed home with self  Prior level of function: independent  Prior/Current Home Services No current home services  Social Drivers of Health Review SDOH reviewed no interventions necessary  Readmission risk has been reviewed Yes  Transition of care needs no transition of care needs at this time

## 2024-10-25 NOTE — Consult Note (Signed)
 Reason for Consult: Dysphagia abnormal barium swallow Referring Physician: Hospital team  Stephanie Thornton is an 53 y.o. female.  HPI: Patient seen and examined and her hospital computer chart reviewed and case discussed with the hospital team and she has been dilated twice in [facility] unfortunately those records are not available and both times the dilations helped and she seems to have problems with both liquids and solids and her weight seems to go up and down and she has no abdominal pain and she never had what sounds like a manometry nor have they used the word achalasia and she has no other complaints  Past Medical History:  Diagnosis Date   Anxiety    Arthritis    knees, feet, back (05/24/2015)   Benign essential HTN 05/01/2015   Bipolar disorder (HCC) diagnosed early 90s   CAD in native artery    Chronic bronchitis (HCC)    get it q yr   Chronic lower back pain    Depression    Diabetes mellitus type II 2010   GERD (gastroesophageal reflux disease)    Headache    maybe 3 times/wk (05/24/2015)   History of hiatal hernia    History of stomach ulcers    HLD (hyperlipidemia)    Menopause    Migraine    1-2/wk (05/24/2015)   NSTEMI (non-ST elevated myocardial infarction) (HCC) 04/2022   OSA on CPAP    Stroke (HCC)    r sided weakness    Past Surgical History:  Procedure Laterality Date   ABDOMINAL HYSTERECTOMY  2007   BUNIONECTOMY Bilateral    CARPAL TUNNEL RELEASE Right    CORONARY STENT INTERVENTION N/A 05/07/2022   Procedure: CORONARY STENT INTERVENTION;  Surgeon: Claudene Victory ORN, MD;  Location: [facility] INVASIVE CV LAB;  Service: Cardiovascular;  Laterality: N/A;   HAMMER TOE SURGERY Left    LEFT HEART CATH AND CORONARY ANGIOGRAPHY N/A 05/20/2018   Procedure: LEFT HEART CATH AND CORONARY ANGIOGRAPHY;  Surgeon: Anner Alm ORN, MD;  Location: [facility] INVASIVE CV LAB;  Service: Cardiovascular;  Laterality: N/A;   LEFT HEART CATH AND CORONARY ANGIOGRAPHY N/A 05/07/2022    Procedure: LEFT HEART CATH AND CORONARY ANGIOGRAPHY;  Surgeon: Claudene Victory ORN, MD;  Location: [facility] INVASIVE CV LAB;  Service: Cardiovascular;  Laterality: N/A;   TONSILLECTOMY Bilateral 05/24/2015   Procedure: TONSILLECTOMY;  Surgeon: Vaughan Ricker, MD;  Location: [facility] OR;  Service: ENT;  Laterality: Bilateral;    Family History  Problem Relation Age of Onset   Bipolar disorder Mother    CVA Mother    Bipolar disorder Son    Depression Brother        schizoaffective d/o   Hypertension Sister    Heart disease Maternal Aunt    Heart attack Maternal Aunt    Hypertension Brother     Social History:  reports that she quit smoking about 2 years ago. Her smoking use included cigarettes. She started smoking about 32 years ago. She has a 30 pack-year smoking history. She has never used smokeless tobacco. She reports that she does not drink alcohol and does not use drugs.  Allergies:  Allergies  Allergen Reactions   Lyrica [Pregabalin] Shortness Of Breath, Itching and Rash   Rocephin  [Ceftriaxone  Sodium In Dextrose ] Shortness Of Breath   Sulfa Antibiotics Hives, Shortness Of Breath and Itching   Ultram [Tramadol] Hives and Shortness Of Breath    Had asthma attack   Vancomycin Anaphylaxis    Per Dr. Precilla  Glucotrol  [Glipizide ] Hives   Keflex  [Cephalexin ] Hives   Metformin  Dermatitis   Penicillins Hives   Janumet  [Sitagliptin  Phos-Metformin  Hcl] Nausea Only    Medications: I have reviewed the patient's current medications.  Results for orders placed or performed during the hospital encounter of 10/23/24 (from the past 48 hours)  CBC with Differential     Status: Abnormal   Collection Time: 10/23/24  3:58 PM  Result Value Ref Range   WBC 11.1 (H) 4.0 - 10.5 K/uL   RBC 5.51 (H) 3.87 - 5.11 MIL/uL   Hemoglobin 14.6 12.0 - 15.0 g/dL   HCT 55.2 63.9 - 53.9 %   MCV 81.1 80.0 - 100.0 fL   MCH 26.5 26.0 - 34.0 pg   MCHC 32.7 30.0 - 36.0 g/dL   RDW 84.9 88.4 - 84.4 %   Platelets 250 150 -  400 K/uL   nRBC 0.0 0.0 - 0.2 %   Neutrophils Relative % 66 %   Neutro Abs 7.4 1.7 - 7.7 K/uL   Lymphocytes Relative 27 %   Lymphs Abs 3.0 0.7 - 4.0 K/uL   Monocytes Relative 6 %   Monocytes Absolute 0.6 0.1 - 1.0 K/uL   Eosinophils Relative 1 %   Eosinophils Absolute 0.1 0.0 - 0.5 K/uL   Basophils Relative 0 %   Basophils Absolute 0.0 0.0 - 0.1 K/uL   Immature Granulocytes 0 %   Abs Immature Granulocytes 0.03 0.00 - 0.07 K/uL    Comment: Performed at [facility], [address], [place], [place] [address]  Comprehensive metabolic panel     Status: Abnormal   Collection Time: 10/23/24  3:58 PM  Result Value Ref Range   Sodium 134 (L) 135 - 145 mmol/L   Potassium 3.3 (L) 3.5 - 5.1 mmol/L   Chloride 95 (L) 98 - 111 mmol/L   CO2 22 22 - 32 mmol/L   Glucose, Bld 185 (H) 70 - 99 mg/dL    Comment: Glucose reference range applies only to samples taken after fasting for at least 8 hours.   BUN 31 (H) 6 - 20 mg/dL   Creatinine, Ser 6.78 (H) 0.44 - 1.00 mg/dL   Calcium  9.8 8.9 - 10.3 mg/dL   Total Protein 8.1 6.5 - 8.1 g/dL   Albumin 4.5 3.5 - 5.0 g/dL   AST 21 15 - 41 U/L   ALT 21 0 - 44 U/L   Alkaline Phosphatase 102 38 - 126 U/L   Total Bilirubin 0.5 0.0 - 1.2 mg/dL   GFR, Estimated 17 (L) >60 mL/min    Comment: (NOTE) Calculated using the CKD-EPI Creatinine Equation (2021)    Anion gap 17 (H) 5 - 15    Comment: Performed at [facility], [address] [place], [place] [address]  Lipase, blood     Status: None   Collection Time: 10/23/24  3:59 PM  Result Value Ref Range   Lipase 32 11 - 51 U/L    Comment: Performed at [facility], [address], [place], [place] [address]  Glucose, capillary     Status: Abnormal   Collection Time: 10/23/24  9:23 PM  Result Value Ref Range   Glucose-Capillary 177 (H) 70 - 99 mg/dL    Comment: Glucose reference range applies only to samples taken after fasting for at least 8 hours.   Magnesium      Status: None   Collection Time: 10/24/24  4:47 AM  Result Value Ref Range  Magnesium  1.8 1.7 - 2.4 mg/dL    Comment: Performed at [facility], [address], [place], [place] [address]  Basic metabolic panel     Status: Abnormal   Collection Time: 10/24/24  4:47 AM  Result Value Ref Range   Sodium 135 135 - 145 mmol/L   Potassium 3.6 3.5 - 5.1 mmol/L   Chloride 105 98 - 111 mmol/L   CO2 21 (L) 22 - 32 mmol/L   Glucose, Bld 127 (H) 70 - 99 mg/dL    Comment: Glucose reference range applies only to samples taken after fasting for at least 8 hours.   BUN 27 (H) 6 - 20 mg/dL   Creatinine, Ser 8.24 (H) 0.44 - 1.00 mg/dL   Calcium  8.3 (L) 8.9 - 10.3 mg/dL   GFR, Estimated 34 (L) >60 mL/min    Comment: (NOTE) Calculated using the CKD-EPI Creatinine Equation (2021)    Anion gap 9 5 - 15    Comment: Performed at [facility], [address] [place], [place] [address]  CBC     Status: Abnormal   Collection Time: 10/24/24  4:47 AM  Result Value Ref Range   WBC 8.7 4.0 - 10.5 K/uL   RBC 4.52 3.87 - 5.11 MIL/uL   Hemoglobin 11.6 (L) 12.0 - 15.0 g/dL   HCT 62.9 63.9 - 53.9 %   MCV 81.9 80.0 - 100.0 fL   MCH 25.7 (L) 26.0 - 34.0 pg   MCHC 31.4 30.0 - 36.0 g/dL   RDW 85.0 88.4 - 84.4 %   Platelets 194 150 - 400 K/uL   nRBC 0.0 0.0 - 0.2 %    Comment: Performed at [facility], [address] [place], [place] [address]  Glucose, capillary     Status: Abnormal   Collection Time: 10/24/24  7:21 AM  Result Value Ref Range   Glucose-Capillary 117 (H) 70 - 99 mg/dL    Comment: Glucose reference range applies only to samples taken after fasting for at least 8 hours.  Glucose, capillary     Status: Abnormal   Collection Time: 10/24/24 11:04 AM  Result Value Ref Range   Glucose-Capillary 164 (H) 70 - 99 mg/dL    Comment: Glucose reference range applies only to samples taken after fasting for at least 8 hours.  Glucose,  capillary     Status: Abnormal   Collection Time: 10/24/24  4:03 PM  Result Value Ref Range   Glucose-Capillary 148 (H) 70 - 99 mg/dL    Comment: Glucose reference range applies only to samples taken after fasting for at least 8 hours.  Glucose, capillary     Status: Abnormal   Collection Time: 10/24/24  8:06 PM  Result Value Ref Range   Glucose-Capillary 205 (H) 70 - 99 mg/dL    Comment: Glucose reference range applies only to samples taken after fasting for at least 8 hours.  Glucose, capillary     Status: Abnormal   Collection Time: 10/25/24  1:34 AM  Result Value Ref Range   Glucose-Capillary 215 (H) 70 - 99 mg/dL    Comment: Glucose reference range applies only to samples taken after fasting for at least 8 hours.  CBC     Status: Abnormal   Collection Time: 10/25/24  3:44 AM  Result Value Ref Range   WBC 5.8 4.0 - 10.5 K/uL   RBC 4.23 3.87 - 5.11 MIL/uL   Hemoglobin 11.2 (L) 12.0 - 15.0 g/dL  HCT 35.4 (L) 36.0 - 46.0 %   MCV 83.7 80.0 - 100.0 fL   MCH 26.5 26.0 - 34.0 pg   MCHC 31.6 30.0 - 36.0 g/dL   RDW 84.5 88.4 - 84.4 %   Platelets 198 150 - 400 K/uL   nRBC 0.0 0.0 - 0.2 %    Comment: Performed at [facility], [address] [place], [place] [address]  Basic metabolic panel with GFR     Status: Abnormal   Collection Time: 10/25/24  3:44 AM  Result Value Ref Range   Sodium 140 135 - 145 mmol/L   Potassium 3.9 3.5 - 5.1 mmol/L   Chloride 108 98 - 111 mmol/L   CO2 22 22 - 32 mmol/L   Glucose, Bld 229 (H) 70 - 99 mg/dL    Comment: Glucose reference range applies only to samples taken after fasting for at least 8 hours.   BUN 16 6 - 20 mg/dL   Creatinine, Ser 8.71 (H) 0.44 - 1.00 mg/dL   Calcium  8.9 8.9 - 10.3 mg/dL   GFR, Estimated 50 (L) >60 mL/min    Comment: (NOTE) Calculated using the CKD-EPI Creatinine Equation (2021)    Anion gap 10 5 - 15    Comment: Performed at [facility], [address], [place], [place] [address]   Magnesium      Status: None   Collection Time: 10/25/24  3:44 AM  Result Value Ref Range   Magnesium  1.8 1.7 - 2.4 mg/dL    Comment: Performed at [facility], [address], [place], [place] [address]  Glucose, capillary     Status: Abnormal   Collection Time: 10/25/24  3:56 AM  Result Value Ref Range   Glucose-Capillary 211 (H) 70 - 99 mg/dL    Comment: Glucose reference range applies only to samples taken after fasting for at least 8 hours.  Glucose, capillary     Status: Abnormal   Collection Time: 10/25/24  7:27 AM  Result Value Ref Range   Glucose-Capillary 168 (H) 70 - 99 mg/dL    Comment: Glucose reference range applies only to samples taken after fasting for at least 8 hours.  Glucose, capillary     Status: Abnormal   Collection Time: 10/25/24 11:14 AM  Result Value Ref Range   Glucose-Capillary 259 (H) 70 - 99 mg/dL    Comment: Glucose reference range applies only to samples taken after fasting for at least 8 hours.    DG ESOPHAGUS W SINGLE CM (SOL OR THIN BA) Result Date: 10/25/2024 EXAM: SINGLE CONTRAST ESOPHAGRAM 10/25/2024 10:18:00 AM TECHNIQUE: Multiple single contrast images of the esophagus and gastroesophageal junction were obtained following the oral administration of water soluble contrast FLUOROSCOPY DOSE AND TYPE: Radiation Dose Index: Reference Air Kerma (in mGy) = 67.0 Fluoroscopy time: 3 min 12 sec COMPARISON: None available. CLINICAL HISTORY: Vomiting and sometimes choking when eating/drinking. FINDINGS: Contrast flows rapidly through the esophagus into the stomach through a patent GE junction. No mucosal irregularity identified in the thoracic esophagus or distal esophagus. There was severe esophageal dysmotility with no initiation of the primary stripping wave in supine position and poor initiation in upright position, presumably related to the distal stricture. 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 No evidence of leak. No hiatal hernia is seen and there is no evidence of achalasia. No gastroesophageal reflux was elicited during examination. IMPRESSION: 1. 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 2. Severe esophageal dysmotility with poor initiation of the primary stripping wave, presumably related to the distal stricture. Electronically signed by: Norleen Boxer MD 10/25/2024 10:55 AM EDT RP Workstation: [id number]    ROS negative except above Blood pressure (!) 188/108, pulse 95, temperature 98.4 F (36.9 C), temperature source Oral, resp. rate 20, height 5' 2 (1.575 m), weight 78.1 kg, SpO2 99%. Physical Exam vital signs stable afebrile no acute distress in good spirits abdomen is soft nontender barium swallow reviewed questionable achalasia labs reviewed BUN and creatinine improved albumin 4.5 CBC okay she was not iron deficient CT x 2 okay  Assessment/Plan: Dysphagia abnormal barium swallow Plan: The risk benefits methods of EGD with possible dilation versus Botox was discussed with the patient and we will proceed tomorrow morning with further workup and plans pending those findings  Kimani Bedoya E 10/25/2024, 2:34 PM

## 2024-10-25 NOTE — Progress Notes (Signed)
 SLP Non-tx note  Patient Details Name: Stephanie Thornton MRN: [id number] DOB: [birth year]  SLP reviewed results and recommendations from GI following patient's barium esophagram which indicate: severe esophageal dysmotility and 13 mm barium tablet failing to pass the GE junction, suggesting recurrent stricturing of GE junction. Per GI MD, plan is for EGD with possible dilation versus Botox. SLP to s/o at this time but please reorder if concern for oropharyngeal dysphagia.   Norleen IVAR Blase, MA, CCC-SLP Speech Therapy

## 2024-10-26 ENCOUNTER — Inpatient Hospital Stay (HOSPITAL_COMMUNITY): Admitting: Anesthesiology

## 2024-10-26 ENCOUNTER — Encounter (HOSPITAL_COMMUNITY): Payer: Self-pay | Admitting: Internal Medicine

## 2024-10-26 ENCOUNTER — Encounter (HOSPITAL_COMMUNITY): Admission: EM | Disposition: A | Payer: Self-pay | Source: Home / Self Care | Attending: Internal Medicine

## 2024-10-26 DIAGNOSIS — I10 Essential (primary) hypertension: Secondary | ICD-10-CM

## 2024-10-26 DIAGNOSIS — Z87891 Personal history of nicotine dependence: Secondary | ICD-10-CM | POA: Diagnosis not present

## 2024-10-26 DIAGNOSIS — R1310 Dysphagia, unspecified: Secondary | ICD-10-CM | POA: Diagnosis not present

## 2024-10-26 DIAGNOSIS — I2510 Atherosclerotic heart disease of native coronary artery without angina pectoris: Secondary | ICD-10-CM

## 2024-10-26 DIAGNOSIS — N179 Acute kidney failure, unspecified: Secondary | ICD-10-CM | POA: Diagnosis not present

## 2024-10-26 HISTORY — PX: ESOPHAGOGASTRODUODENOSCOPY: SHX5428

## 2024-10-26 LAB — BASIC METABOLIC PANEL WITH GFR
Anion gap: 10 (ref 5–15)
BUN: 11 mg/dL (ref 6–20)
CO2: 22 mmol/L (ref 22–32)
Calcium: 9.2 mg/dL (ref 8.9–10.3)
Chloride: 106 mmol/L (ref 98–111)
Creatinine, Ser: 1.04 mg/dL — ABNORMAL HIGH (ref 0.44–1.00)
GFR, Estimated: >60 mL/min (ref >60)
Glucose, Bld: 226 mg/dL — ABNORMAL HIGH (ref 70–99)
Potassium: 4 mmol/L (ref 3.5–5.1)
Sodium: 137 mmol/L (ref 135–145)

## 2024-10-26 LAB — CBC
HCT: 42 % (ref 36.0–46.0)
Hemoglobin: 12.8 g/dL (ref 12.0–15.0)
MCH: 25.8 pg — ABNORMAL LOW (ref 26.0–34.0)
MCHC: 30.5 g/dL (ref 30.0–36.0)
MCV: 84.7 fL (ref 80.0–100.0)
Platelets: 200 K/uL (ref 150–400)
RBC: 4.96 MIL/uL (ref 3.87–5.11)
RDW: 15.6 % — ABNORMAL HIGH (ref 11.5–15.5)
WBC: 7 K/uL (ref 4.0–10.5)
nRBC: 0 % (ref 0.0–0.2)

## 2024-10-26 LAB — GLUCOSE, CAPILLARY
Glucose-Capillary: 154 mg/dL — ABNORMAL HIGH (ref 70–99)
Glucose-Capillary: 168 mg/dL — ABNORMAL HIGH (ref 70–99)
Glucose-Capillary: 193 mg/dL — ABNORMAL HIGH (ref 70–99)
Glucose-Capillary: 216 mg/dL — ABNORMAL HIGH (ref 70–99)
Glucose-Capillary: 218 mg/dL — ABNORMAL HIGH (ref 70–99)
Glucose-Capillary: 221 mg/dL — ABNORMAL HIGH (ref 70–99)

## 2024-10-26 LAB — MAGNESIUM: Magnesium: 1.7 mg/dL (ref 1.7–2.4)

## 2024-10-26 SURGERY — EGD (ESOPHAGOGASTRODUODENOSCOPY)
Anesthesia: Monitor Anesthesia Care

## 2024-10-26 MED ORDER — LIDOCAINE 2% (20 MG/ML) 5 ML SYRINGE
INTRAMUSCULAR | Status: DC | PRN
  Administered 2024-10-26: 100 mg via INTRAVENOUS

## 2024-10-26 MED ORDER — ONABOTULINUMTOXINA 100 UNITS IJ SOLR
INTRAMUSCULAR | Status: AC
  Filled 2024-10-26: qty 100

## 2024-10-26 MED ORDER — FENTANYL CITRATE (PF) 100 MCG/2ML IJ SOLN
INTRAMUSCULAR | Status: DC | PRN
  Administered 2024-10-26: 50 ug via INTRAVENOUS

## 2024-10-26 MED ORDER — LACTATED RINGERS IV SOLN: INTRAVENOUS | Status: DC | PRN

## 2024-10-26 MED ORDER — INSULIN GLARGINE-YFGN 100 UNIT/ML ~~LOC~~ SOLN
8.0000 [IU] | Freq: Every day | SUBCUTANEOUS | Status: DC
  Administered 2024-10-26: 8 [IU] via SUBCUTANEOUS
  Filled 2024-10-26 (×2): qty 0.08

## 2024-10-26 MED ORDER — PROPOFOL 500 MG/50ML IV EMUL
INTRAVENOUS | Status: DC | PRN
  Administered 2024-10-26: 130 ug/kg/min via INTRAVENOUS

## 2024-10-26 MED ORDER — FENTANYL CITRATE (PF) 100 MCG/2ML IJ SOLN
INTRAMUSCULAR | Status: AC
  Filled 2024-10-26: qty 2

## 2024-10-26 MED ORDER — LACTATED RINGERS IV SOLN: INTRAVENOUS | Status: AC

## 2024-10-26 MED ORDER — PROPOFOL 10 MG/ML IV BOLUS
INTRAVENOUS | Status: DC | PRN
  Administered 2024-10-26: 50 mg via INTRAVENOUS

## 2024-10-26 MED ORDER — PROPOFOL 1000 MG/100ML IV EMUL
INTRAVENOUS | Status: AC
  Filled 2024-10-26: qty 100

## 2024-10-26 MED ORDER — SODIUM CHLORIDE (PF) 0.9 % IJ SOLN
INTRAMUSCULAR | Status: AC
  Filled 2024-10-26: qty 10

## 2024-10-26 NOTE — Transfer of Care (Signed)
 Immediate Anesthesia Transfer of Care Note  Patient: Stephanie Thornton  Procedure(s) Performed: EGD (ESOPHAGOGASTRODUODENOSCOPY)  Patient Location: PACU and Endoscopy Unit  Anesthesia Type:MAC  Level of Consciousness: sedated and responds to stimulation  Airway & Oxygen Therapy: Patient Spontanous Breathing and Patient connected to face mask oxygen  Post-op Assessment: Report given to RN and Post -op Vital signs reviewed and stable  Post vital signs: Reviewed and stable  Last Vitals:  Vitals Value Taken Time  BP 93/55 10/26/24 13:55  Temp    Pulse 60 10/26/24 13:57  Resp 6 10/26/24 13:57  SpO2 100 % 10/26/24 13:57  Vitals shown include unfiled device data.  Last Pain:  Vitals:   10/26/24 1234  TempSrc: Temporal  PainSc: 0-No pain      Patients Stated Pain Goal: 0 (10/26/24 1234)  Complications: No notable events documented.

## 2024-10-26 NOTE — Inpatient Diabetes Management (Signed)
 Inpatient Diabetes Program Recommendations  AACE/ADA: New Consensus Statement on Inpatient Glycemic Control (2015)  Target Ranges:  Prepandial:   less than 140 mg/dL      Peak postprandial:   less than 180 mg/dL (1-2 hours)      Critically ill patients:  140 - 180 mg/dL    Latest Reference Range & Units 10/25/24 01:34 10/25/24 03:56 10/25/24 07:27 10/25/24 11:14 10/25/24 17:10 10/25/24 20:23  Glucose-Capillary 70 - 99 mg/dL 784 (H)  2 units Novolog   211 (H)  2 units Novolog   168 (H)  1 unit Novolog  @1058    259 (H)  3 units Novolog  @1350   5 units Semglee  @1058   148 (H) 232 (H)  2 units Novolog    (H): Data is abnormally high  Latest Reference Range & Units 10/26/24 00:04 10/26/24 04:09  Glucose-Capillary 70 - 99 mg/dL 806 (H) 781 (H)  2 units Novolog    (H): Data is abnormally high    History: DM2  Home DM Meds: OmniPod insulin  pump with Humalog insulin  (managed by daughter-in-law)    Dexcom G7 CGM    Ozempic Qweek (pt not restarted per H&P)   Current Orders: Novolog  0-6 units Q4H     Semglee  5 units daily    MD- Please consider:  1. Increase Semglee  to 8 units daily  2. Change Novolog  SSI to TID AC + HS  3. Start low dose Novolog  Meal Coverage: Novolog  3 units TID with meals HOLD if pt NPO HOLD if pt eats <50% meals     --Will follow patient during hospitalization--  Adina Rudolpho Arrow RN, MSN, CDCES Diabetes Coordinator Inpatient Glycemic Control Team Team Pager: [phone] (8a-5p)

## 2024-10-26 NOTE — Progress Notes (Signed)
   10/26/24 2032  BiPAP/CPAP/SIPAP  BiPAP/CPAP/SIPAP Pt Type Adult  Reason BIPAP/CPAP not in use Non-compliant (Pt refusing cpap for the night)

## 2024-10-26 NOTE — Progress Notes (Signed)
 Stephanie Thornton 1:13 PM  Subjective: Patient seen and examined and no new complaints since her SAR yesterday we rediscussed her procedure and answered all of her questions  Objective: Vital signs stable afebrile no acute distress exam please see preassessment evaluation labs okay  Assessment: Dysphagia abnormal barium swallow  Plan: Okay to proceed with endoscopy with possible dilation or Botox pending endoscopic findings with anesthesia assistance and I discussed the procedure with the patient and she is in agreement  [facility]  office [phone] After 5PM or if no answer call [phone]

## 2024-10-26 NOTE — Op Note (Signed)
 [facility] Patient Name: Stephanie Thornton Procedure Date: 10/26/2024 MRN: [id number] Attending MD: Oliva Boots , MD, [id number] Date of Birth: 11/25/1971 CSN: [id number] Age: 53 Admit Type: Inpatient Procedure:                Upper GI endoscopy Indications:              Dysphagia helped bile dilation in the past x 2 Providers:                Oliva Boots, MD, Hoy Penner, RN, Coye Bade, Technician Referring MD:              Medicines:                Monitored Anesthesia Care Complications:            No immediate complications. Estimated Blood Loss:     Estimated blood loss: none. Procedure:                Pre-Anesthesia Assessment:                           - Prior to the procedure, a History and Physical                            was performed, and patient medications and                            allergies were reviewed. The patient's tolerance of                            previous anesthesia was also reviewed. The risks                            and benefits of the procedure and the sedation                            options and risks were discussed with the patient.                            All questions were answered, and informed consent                            was obtained. Prior Anticoagulants: The patient has                            taken no anticoagulant or antiplatelet agents                            except for aspirin . ASA Grade Assessment: II - A                            patient with mild systemic disease. After reviewing  the risks and benefits, the patient was deemed in                            satisfactory condition to undergo the procedure.                           After obtaining informed consent, the endoscope was                            passed under direct vision. Throughout the                            procedure, the patient's blood pressure, pulse, and                             oxygen saturations were monitored continuously. The                            [id number] ([id number]) Olympus endoscope was                            introduced through the mouth, and advanced to the                            third part of duodenum. The upper GI endoscopy was                            accomplished without difficulty. The patient                            tolerated the procedure well. Scope In: Scope Out: Findings:      The larynx was normal.      Abnormal motility was noted in the distal esophagus and at the       gastroesophageal junction. There is a decrease in motility of the       esophageal body. The distal esophagus/lower esophageal sphincter is       spastic, but gives up passage to the endoscope. A TTS dilator was passed       through the scope. Dilation with a 15-16.5-18 mm balloon dilator was       performed to 18 mm.      The entire examined stomach was normal.      The duodenal bulb, first portion of the duodenum, second portion of the       duodenum and third portion of the duodenum were normal.      The cardia and gastric fundus were normal on retroflexion. Impression:               - Normal larynx.                           - Abnormal esophageal motility, suspicious for                            presbyesophagus. Dilated. It did not look like  frank achalasia but her primary GI in [facility]                            may want to consider either a manometry in the                            future or trying Botox if needed                           - Normal stomach.                           - Normal duodenal bulb, first portion of the                            duodenum, second portion of the duodenum and third                            portion of the duodenum.                           - No specimens collected. Moderate Sedation:      Not Applicable - Patient had care per Anesthesia. Recommendation:            - Soft diet today. May resume her usual diet if she                            has diabetic or sodium restrictions in the morning                           - Continue present medications.                           - Return to GI clinic PRN. Return to her [facility]                            primary gastroenterologist as needed                           - Telephone GI clinic if symptomatic PRN. Please                            call me if I can be of any further assistance with                            this hospital stay Procedure Code(s):        --- Professional ---                           [phone], Esophagogastroduodenoscopy, flexible,                            transoral; with transendoscopic balloon dilation of  esophagus (less than 30 mm diameter) Diagnosis Code(s):        --- Professional ---                           K22.4, Dyskinesia of esophagus                           R13.10, Dysphagia, unspecified CPT copyright 2022 [employer]. All rights reserved. The codes documented in this report are preliminary and upon coder review may  be revised to meet current compliance requirements. Oliva Boots, MD 10/26/2024 2:00:05 PM This report has been signed electronically. Number of Addenda: 0

## 2024-10-26 NOTE — Anesthesia Procedure Notes (Signed)
 Procedure Name: MAC Date/Time: 10/26/2024 1:30 PM  Performed by: Obadiah Reyes BROCKS, CRNAPre-anesthesia Checklist: Patient identified, Emergency Drugs available, Suction available, Patient being monitored and Timeout performed Patient Re-evaluated:Patient Re-evaluated prior to induction Preoxygenation: Pre-oxygenation with 100% oxygen Induction Type: IV induction

## 2024-10-26 NOTE — Plan of Care (Signed)
  Problem: Health Behavior/Discharge Planning: Goal: Ability to manage health-related needs will improve Outcome: Progressing   Problem: Metabolic: Goal: Ability to maintain appropriate glucose levels will improve Outcome: Progressing   Problem: Skin Integrity: Goal: Risk for impaired skin integrity will decrease Outcome: Progressing   Problem: Pain Managment: Goal: General experience of comfort will improve and/or be controlled Outcome: Progressing

## 2024-10-26 NOTE — Anesthesia Preprocedure Evaluation (Addendum)
 Anesthesia Evaluation  Patient identified by MRN, date of birth, ID band Patient awake    Reviewed: Allergy & Precautions, NPO status , Patient's Chart, lab work & pertinent test results, reviewed documented beta blocker date and time   Airway Mallampati: III  TM Distance: >3 FB Neck ROM: Full    Dental  (+) Edentulous Upper, Partial Lower, Missing, Dental Advisory Given, Poor Dentition   Pulmonary sleep apnea , former smoker   Pulmonary exam normal breath sounds clear to auscultation       Cardiovascular hypertension, Pt. on home beta blockers and Pt. on medications + CAD, + Past MI and + Cardiac Stents   Rhythm:Regular Rate:Normal  Echo 2023  1. Left ventricular ejection fraction, by estimation, is 50 to 55%. The  left ventricle has low normal function. The left ventricle demonstrates  regional wall motion abnormalities (see scoring diagram/findings for  description). There is mild concentric left ventricular hypertrophy.  Indeterminate diastolic filling due to E-A fusion.   2. Right ventricular systolic function is normal. The right ventricular  size is normal. There is mildly elevated pulmonary artery systolic  pressure. The estimated right ventricular systolic pressure is 39.2 mmHg.   3. The mitral valve is grossly normal. Trivial mitral valve  regurgitation. No evidence of mitral stenosis.   4. The aortic valve is tricuspid. Aortic valve regurgitation is not  visualized. No aortic stenosis is present.   5. The inferior vena cava is dilated in size with <50% respiratory  variability, suggesting right atrial pressure of 15 mmHg.     Neuro/Psych  Headaches PSYCHIATRIC DISORDERS Anxiety Depression Bipolar Disorder   CVA    GI/Hepatic Neg liver ROS, hiatal hernia,GERD  Medicated and Controlled,,  Endo/Other  diabetes    Renal/GU Renal disease     Musculoskeletal  (+) Arthritis ,    Abdominal   Peds   Hematology negative hematology ROS (+)   Anesthesia Other Findings   Reproductive/Obstetrics                              Anesthesia Physical Anesthesia Plan  ASA: 3  Anesthesia Plan: MAC   Post-op Pain Management: Minimal or no pain anticipated   Induction: Intravenous  PONV Risk Score and Plan: Propofol  infusion, TIVA and Treatment may vary due to age or medical condition  Airway Management Planned: Natural Airway  Additional Equipment:   Intra-op Plan:   Post-operative Plan:   Informed Consent: I have reviewed the patients History and Physical, chart, labs and discussed the procedure including the risks, benefits and alternatives for the proposed anesthesia with the patient or authorized representative who has indicated his/her understanding and acceptance.     Dental advisory given  Plan Discussed with: CRNA  Anesthesia Plan Comments: (Risks of anesthesia explained at length. This includes, but is not limited to, aspiration, sore throat, damage to teeth, lips gums, tongue and vocal cords, nausea and vomiting, reactions to medications, stroke, heart attack, and death. All patient questions were answered and the patient wishes to proceed.  Discussed possible need to convert to [facility])         Anesthesia Quick Evaluation

## 2024-10-26 NOTE — Progress Notes (Addendum)
 PROGRESS NOTE    Stephanie Thornton  FMW:[id number] DOB: 03-11-1971 DOA: 10/23/2024 PCP: Austin Mutton, MD    Brief Narrative:   Stephanie Thornton is a 53 y.o. female with past medical history significant for HTN, DM2, bipolar disorder, CVA, history of esophageal stricture, OSA on CPAP who presented to [facility] ED on 10/23/2024 with dizziness, recurrent nausea with intractable vomiting.  Recently discharged for same 5 days prior.  Upon discharge he was feeling well and able to eat without issue.  Two days ago in the evening she went out to eat and subsequently started vomiting once again.  Feels that the presenting symptoms are similar to the past except for dizziness was worse this time.  Upon EMS arrival, she was noted to be hypotensive with a systolic blood pressure in the 60s.  Was given IV fluids with improvement.  Patient was transported to the ED for further evaluation and management.  In the ED, temperature 97.4 F, HR 82, RR 18, BP 93/63, SpO2 96% on room air.  WBC 11.1, hemoglobin 14.6, platelet count 250.  Sodium 134, potassium 3.3, chloride 95, CO2 22, glucose 185, BUN 31, creatinine 3.21.  Lipase 32.  AST 21, ALT 21, total bilirubin 0.5.  Urinalysis with moderate leukocytes, negative nitrite, no bacteria, 0-5 WBCs.  Patient received 2 L NS bolus in the ED.  TRH consulted for admission for further evaluation management of intractable nausea/vomiting, acute renal failure  Assessment & Plan:   Acute renal failure Patient presenting with recurrent intractable nausea/vomiting.  Reports history of esophageal stricture requiring balloon dilation.  Patient is afebrile.  Lipase within normal limits.  Total bilirubin normal limits.  Urinalysis unrevealing.  Creatinine elevated 3.21 (baseline 1.03 on 10/20) -- Cr 3.21>1.75>1.28>1.04 -- Holding home antihypertensives -- LR at 125 mL/h  Hypokalemia Repleted. -- Repeat electrolytes in a.m.  History of esophageal stricture Patient reports previous  history of esophageal stricture, previously required stretching by her GI physician, at [facility] clinic.  Esophagram with noted recurrent stricturing of the GE junction, severe esophageal dysmotility.  Seen by SLP -- GI following, n.p.o. for EGD with balloon dilation today -- Consider discontinuation of home Ozempic given recurrent nausea/vomiting  HTN Patient was notably hypotensive on admission, likely secondary to volume depletion.  At baseline on atenolol  50 mg p.o. daily, amlodipine  10 mg p.o. daily. -- Hold antihypertensives for now -- Continue monitor BP closely  DM2 On insulin  pump at baseline.  Hemoglobin A1c 9.9 on 07/18/2024, poorly controlled. -- Diabetic educator following -- Continue off of insulin  pump -- Semglee  8 u [place] daily -- Very sensitive SSI for coverage -- CBG every 4 hours  Bipolar disorder -- BuSpar  50 mg p.o. 3 times daily -- Lexapro  500 p.o. every morning -- Latuda  80 mg p.o. nightly -- Topamax  20 mg p.o. nightly -- Amitriptyline  150 Thomson p.o. nightly  GERD -- Protonix  40 mg p.o. daily  OSA Continue nocturnal CPAP  Obesity, class I Body mass index is 31.48 kg/m.    DVT prophylaxis: enoxaparin  (LOVENOX ) injection 40 mg Start: 10/24/24 2200    Code Status: Full Code Family Communication: No family present at bedside  Disposition Plan:  Level of care: Telemetry Status is: Inpatient Remains inpatient appropriate because: EGD with balloon dilation today    Consultants:  [facility]  Procedures:  Upper GI series: + Esophageal stricture  Antimicrobials:  None   Subjective: Patient seen examined bedside, lying in bed.  Awaiting EGD with balloon dilation today.  Has been tolerating  diet.  Renal function now back at baseline.  No other questions or concerns at this time.  Denies headache, no dizziness, no chest pain, no palpitations, no shortness of breath, no abdominal pain, no fever/chills/night sweats, no  nausea/vomiting/diarrhea, no focal weakness, no fatigue, no paresthesia.  No acute events overnight per nursing.  Objective: Vitals:   10/25/24 1327 10/25/24 2017 10/25/24 2024 10/26/24 0414  BP: (!) 188/108 (!) 168/104 (!) 189/109 (!) 157/98  Pulse: 95 79 79 77  Resp: 20 18  18   Temp: 98.4 F (36.9 C) 99.2 F (37.3 C)  98 F (36.7 C)  TempSrc: Oral Oral    SpO2: 99% 99%  98%  Weight:      Height:        Intake/Output Summary (Last 24 hours) at 10/26/2024 1123 Last data filed at 10/26/2024 0300 Gross per 24 hour  Intake 2244.17 ml  Output --  Net 2244.17 ml   Filed Weights   10/23/24 1445 10/23/24 2059  Weight: 75 kg 78.1 kg    Examination:  Physical Exam: GEN: NAD, alert and oriented x 3, obese HEENT: NCAT, PERRL, EOMI, sclera clear, MMM PULM: CTAB w/o wheezes/crackles, normal respiratory effort, on room air CV: RRR w/o M/G/R GI: abd soft, NTND, + BS MSK: no peripheral edema, moves all extremities independently NEURO: No focal neurological deficit PSYCH: normal mood/affect Integumentary: No concerning rashes/lesions/wounds noted on exposed skin surfaces    Data Reviewed: I have personally reviewed following labs and imaging studies  CBC: Recent Labs  Lab 10/23/24 1558 10/24/24 0447 10/25/24 0344 10/26/24 0411  WBC 11.1* 8.7 5.8 7.0  NEUTROABS 7.4  --   --   --   HGB 14.6 11.6* 11.2* 12.8  HCT 44.7 37.0 35.4* 42.0  MCV 81.1 81.9 83.7 84.7  PLT 250 194 198 200   Basic Metabolic Panel: Recent Labs  Lab 10/23/24 1558 10/24/24 0447 10/25/24 0344 10/26/24 0411  NA 134* 135 140 137  K 3.3* 3.6 3.9 4.0  CL 95* 105 108 106  CO2 22 21* 22 22  GLUCOSE 185* 127* 229* 226*  BUN 31* 27* 16 11  CREATININE 3.21* 1.75* 1.28* 1.04*  CALCIUM  9.8 8.3* 8.9 9.2  MG  --  1.8 1.8 1.7   GFR: Estimated Creatinine Clearance: 61.2 mL/min (A) (by C-G formula based on SCr of 1.04 mg/dL (H)). Liver Function Tests: Recent Labs  Lab 10/23/24 1558  AST 21  ALT 21   ALKPHOS 102  BILITOT 0.5  PROT 8.1  ALBUMIN 4.5   Recent Labs  Lab 10/23/24 1559  LIPASE 32   No results for input(s): AMMONIA in the last 168 hours. Coagulation Profile: No results for input(s): INR, PROTIME in the last 168 hours. Cardiac Enzymes: No results for input(s): CKTOTAL, CKMB, CKMBINDEX, TROPONINI in the last 168 hours. BNP (last 3 results) No results for input(s): PROBNP in the last 8760 hours. HbA1C: No results for input(s): HGBA1C in the last 72 hours. CBG: Recent Labs  Lab 10/25/24 1710 10/25/24 2023 10/26/24 0004 10/26/24 0409 10/26/24 0747  GLUCAP 148* 232* 193* 218* 216*   Lipid Profile: No results for input(s): CHOL, HDL, LDLCALC, TRIG, CHOLHDL, LDLDIRECT in the last 72 hours. Thyroid  Function Tests: No results for input(s): TSH, T4TOTAL, FREET4, T3FREE, THYROIDAB in the last 72 hours. Anemia Panel: No results for input(s): VITAMINB12, FOLATE, FERRITIN, TIBC, IRON, RETICCTPCT in the last 72 hours. Sepsis Labs: No results for input(s): PROCALCITON, LATICACIDVEN in the last 168 hours.  Recent Results (  from the past 240 hours)  Blood Culture (routine x 2)     Status: None   Collection Time: 10/16/24  3:57 PM   Specimen: BLOOD RIGHT HAND  Result Value Ref Range Status   Specimen Description   Final    BLOOD RIGHT HAND BOTTLES DRAWN AEROBIC ONLY Performed at [facility], [address] [place], [place] [address]    Special Requests   Final    Blood Culture results may not be optimal due to an inadequate volume of blood received in culture bottles Performed at [facility], [address], [place], [place] [address]    Culture   Final    NO GROWTH 5 DAYS Performed at [facility], [address] [place], [place] [address]    Report Status 10/21/2024 FINAL  Final         Radiology Studies: DG ESOPHAGUS W SINGLE CM (SOL OR THIN BA) Result  Date: 10/25/2024 EXAM: SINGLE CONTRAST ESOPHAGRAM 10/25/2024 10:18:00 AM TECHNIQUE: Multiple single contrast images of the esophagus and gastroesophageal junction were obtained following the oral administration of water soluble contrast FLUOROSCOPY DOSE AND TYPE: Radiation Dose Index: Reference Air Kerma (in mGy) = 67.0 Fluoroscopy time: 3 min 12 sec COMPARISON: None available. CLINICAL HISTORY: Vomiting and sometimes choking when eating/drinking. FINDINGS: Contrast flows rapidly through the esophagus into the stomach through a patent GE junction. No mucosal irregularity identified in the thoracic esophagus or distal esophagus. There was severe esophageal dysmotility with no initiation of the primary stripping wave in supine position and poor initiation in upright position, presumably related to the distal stricture. A 13 mm barium tablet failed to pass the GE junction, suggesting recurrent stricturing of the GE junction. No evidence of leak. No hiatal hernia is seen and there is no evidence of achalasia. No gastroesophageal reflux was elicited during examination. IMPRESSION: 1. 13 mm barium tablet failed to pass the GE junction, suggesting recurrent stricturing of the GE junction. 2. Severe esophageal dysmotility with poor initiation of the primary stripping wave, presumably related to the distal stricture. Electronically signed by: Norleen Boxer MD 10/25/2024 10:55 AM EDT RP Workstation: [id number]        Scheduled Meds:  amitriptyline   150 mg Oral QHS   amLODipine   10 mg Oral Daily   atenolol   50 mg Oral Daily   baclofen   10 mg Oral TID   busPIRone   15 mg Oral TID   enoxaparin  (LOVENOX ) injection  40 mg Subcutaneous Q24H   escitalopram   5 mg Oral q morning   insulin  aspart  0-6 Units Subcutaneous Q4H   insulin  glargine-yfgn  8 Units Subcutaneous Daily   lurasidone   80 mg Oral QPM   pantoprazole   40 mg Oral Daily   senna  1 tablet Oral QHS   topiramate   200 mg Oral QHS   Continuous  Infusions:     LOS: 3 days    Time spent: 48 minutes spent on 10/26/2024 caring for this patient face-to-face including chart review, ordering labs/tests, documenting, discussion with nursing staff, consultants, updating family and interview/physical exam    Camellia PARAS Trudee Chirino, DO [facility] Available via Epic secure chat 7am-7pm After these hours, please refer to coverage provider listed on [email or web address] 10/26/2024, 11:23 AM

## 2024-10-27 ENCOUNTER — Encounter (HOSPITAL_COMMUNITY): Payer: Self-pay | Admitting: Gastroenterology

## 2024-10-27 DIAGNOSIS — N179 Acute kidney failure, unspecified: Secondary | ICD-10-CM | POA: Diagnosis not present

## 2024-10-27 LAB — CBC
HCT: 42.7 % (ref 36.0–46.0)
Hemoglobin: 12.5 g/dL (ref 12.0–15.0)
MCH: 25.9 pg — ABNORMAL LOW (ref 26.0–34.0)
MCHC: 29.3 g/dL — ABNORMAL LOW (ref 30.0–36.0)
MCV: 88.6 fL (ref 80.0–100.0)
Platelets: 200 K/uL (ref 150–400)
RBC: 4.82 MIL/uL (ref 3.87–5.11)
RDW: 16 % — ABNORMAL HIGH (ref 11.5–15.5)
WBC: 6.4 K/uL (ref 4.0–10.5)
nRBC: 0 % (ref 0.0–0.2)

## 2024-10-27 LAB — GLUCOSE, CAPILLARY
Glucose-Capillary: 188 mg/dL — ABNORMAL HIGH (ref 70–99)
Glucose-Capillary: 204 mg/dL — ABNORMAL HIGH (ref 70–99)
Glucose-Capillary: 224 mg/dL — ABNORMAL HIGH (ref 70–99)
Glucose-Capillary: 234 mg/dL — ABNORMAL HIGH (ref 70–99)
Glucose-Capillary: 332 mg/dL — ABNORMAL HIGH (ref 70–99)

## 2024-10-27 LAB — BASIC METABOLIC PANEL WITH GFR
Anion gap: 9 (ref 5–15)
BUN: 12 mg/dL (ref 6–20)
CO2: 22 mmol/L (ref 22–32)
Calcium: 9.7 mg/dL (ref 8.9–10.3)
Chloride: 105 mmol/L (ref 98–111)
Creatinine, Ser: 1.27 mg/dL — ABNORMAL HIGH (ref 0.44–1.00)
GFR, Estimated: 51 mL/min — ABNORMAL LOW (ref >60)
Glucose, Bld: 237 mg/dL — ABNORMAL HIGH (ref 70–99)
Potassium: 3.9 mmol/L (ref 3.5–5.1)
Sodium: 136 mmol/L (ref 135–145)

## 2024-10-27 MED ORDER — ONDANSETRON HCL 4 MG PO TABS: 4.0000 mg | ORAL_TABLET | Freq: Four times a day (QID) | ORAL | 0 refills | Status: DC | PRN

## 2024-10-27 MED ORDER — INSULIN GLARGINE-YFGN 100 UNIT/ML ~~LOC~~ SOLN
10.0000 [IU] | Freq: Every day | SUBCUTANEOUS | Status: DC
  Administered 2024-10-27: 10 [IU] via SUBCUTANEOUS
  Filled 2024-10-27 (×2): qty 0.1

## 2024-10-27 NOTE — Inpatient Diabetes Management (Signed)
 Inpatient Diabetes Program Recommendations  AACE/ADA: New Consensus Statement on Inpatient Glycemic Control (2015)  Target Ranges:  Prepandial:   less than 140 mg/dL      Peak postprandial:   less than 180 mg/dL (1-2 hours)      Critically ill patients:  140 - 180 mg/dL   Lab Results  Component Value Date   GLUCAP 204 (H) 10/27/2024   HGBA1C 9.9 (H) 07/18/2024    Review of Glycemic Control  Latest Reference Range & Units 10/26/24 15:56 10/26/24 20:13 10/27/24 00:42 10/27/24 04:08 10/27/24 07:20  Glucose-Capillary 70 - 99 mg/dL 845 (H) 778 (H) 811 (H) 224 (H) 204 (H)  (H): Data is abnormally high  History: DM2   Home DM Meds: OmniPod insulin  pump with Humalog insulin  (managed by daughter-in-law)    Dexcom G7 CGM    Ozempic Qweek (pt not restarted per H&P)    Current Orders: Novolog  0-6 units Q4H                           Semglee  8 units daily       Consider:   1. Increase Semglee  to 10 units daily   2. Change Novolog  SSI to TID AC + HS   3. Start low dose Novolog  Meal Coverage: Novolog  3 units TID with meals HOLD if pt NPO HOLD if pt eats <50% meals  Thanks, Tinnie Minus, MSN, RNC-OB Diabetes Coordinator [phone] (8a-5p)

## 2024-10-27 NOTE — Discharge Summary (Signed)
 Physician Discharge Summary  Stephanie Thornton FMW:[id number] DOB: March 27, 1971 DOA: 10/23/2024  PCP: Austin Mutton, MD  Admit date: 10/23/2024 Discharge date: 10/27/2024  Admitted From: Home Disposition: Home  Recommendations for Outpatient Follow-up:  Follow up with PCP in 1 week with repeat CBC/BMP Outpatient follow-up with regular gastroenterologist at [facility] Follow up in ED if symptoms worsen or new appear   Home Health: No Equipment/Devices: None  Discharge Condition: Stable CODE STATUS: Full Diet recommendation: Heart healthy/carb modified  Brief/Interim Summary: 53 y.o. female with past medical history significant for HTN, DM2, bipolar disorder, CVA, history of esophageal stricture, OSA on CPAP who presented to [facility] ED on 10/23/2024 with dizziness, recurrent nausea with intractable vomiting.  She was admitted for the same.  She was started on IV fluids and antiemetics.  GI was consulted.  She underwent EGD on 10/26/2024: Showed abnormal esophageal motility: Esophagus was dilated.  Subsequently, she has tolerated diet and feels okay to go home today.  Discharge patient home today with outpatient follow-up with PCP and GI.  Discharge Diagnoses:   Acute kidney injury/acute renal failure - Possibly prerenal from intractable nausea and vomiting.  Creatinine 3.1 on presentation.  Improving to 1.27 this morning.  Treated with IV fluids.  Outpatient follow-up.  Intractable nausea and vomiting History of esophageal stricture/esophageal dysmotility - Treated with IV fluids and antiemetics - She underwent EGD on 10/26/2024: Showed abnormal esophageal motility: Esophagus was dilated.  Subsequently, she has tolerated diet and feels okay to go home today.  Discharge patient home today with outpatient follow-up with PCP and GI. - Ozempic to remain on hold on discharge due to recurrent nausea and vomiting  Hypertension - Blood pressure on the higher side.  Continue atenolol  and amlodipine .   Outpatient follow-up with PCP  Diabetes mellitus type 2 with hyperglycemia -A1c 9.9 on 07/18/2024.  Resume insulin  pump at discharge.  Outpatient follow-up  Bipolar disorder - Continue home regimen on discharge.  Outpatient follow-up with psychiatry  Obesity class I - Outpatient follow-up  OSA - Continue nocturnal CPAP  GERD - Continue Protonix   Hyponatremia Resolved  Hypokalemia - Resolved  Leukocytosis - Resolved  Acute metabolic acidosis - Resolved  Discharge Instructions  Discharge Instructions     Diet - low sodium heart healthy   Complete by: As directed    Diet Carb Modified   Complete by: As directed    Increase activity slowly   Complete by: As directed       Allergies as of 10/27/2024       Reactions   Lyrica [pregabalin] Shortness Of Breath, Itching, Rash   Rocephin  [ceftriaxone  Sodium In Dextrose ] Shortness Of Breath   Sulfa Antibiotics Hives, Shortness Of Breath, Itching   Ultram [tramadol] Hives, Shortness Of Breath   Had asthma attack   Vancomycin Anaphylaxis   Per Dr. Rezai   Glucotrol  [glipizide ] Hives   Keflex  [cephalexin ] Hives   Metformin  Dermatitis   Penicillins Hives   Janumet  [sitagliptin  Phos-metformin  Hcl] Nausea Only        Medication List     STOP taking these medications    gabapentin  300 MG capsule Commonly known as: NEURONTIN    lidocaine  5 % Commonly known as: Lidoderm    lisinopril -hydrochlorothiazide  20-12.5 MG tablet Commonly known as: ZESTORETIC    Ozempic (0.25 or 0.5 MG/DOSE) 2 MG/3ML Sopn Generic drug: Semaglutide(0.25 or 0.5MG /DOS)       TAKE these medications    amitriptyline  150 MG tablet Commonly known as: ELAVIL  Take 150 mg by mouth  at bedtime.   amLODipine  10 MG tablet Commonly known as: NORVASC  Take 1 tablet (10 mg total) by mouth daily.   aspirin  81 MG chewable tablet Chew 1 tablet (81 mg total) by mouth daily.   atenolol  50 MG tablet Commonly known as: TENORMIN  Take 50 mg by  mouth daily.   baclofen  10 MG tablet Commonly known as: LIORESAL  Take 10 mg by mouth 3 (three) times daily.   busPIRone  15 MG tablet Commonly known as: BUSPAR  Take 15 mg by mouth 3 (three) times daily.   Dexcom G7 Sensor Misc SMARTSIG:1 Every 10 Days   escitalopram  5 MG tablet Commonly known as: LEXAPRO  Take 5 mg by mouth every morning.   HumaLOG 100 UNIT/ML injection Generic drug: insulin  lispro 60 UNITS DAILY VIA INSULIN  PUMP INJECTION 30 DAYS   hydrOXYzine  25 MG tablet Commonly known as: ATARAX  TAKE 1 TABLET BY ORAL ROUTE 3 TIMES PER DAY TAKE FOR ANXIETY, PANIC, AGITATION, AND SLEEP AS NEEDED   Insulin  Pen Needle 31G X 5 MM Misc 1 Units by Does not apply route 4 (four) times daily.   Latuda  80 MG Tabs tablet Generic drug: lurasidone  Take 80 mg by mouth every evening.   nitroGLYCERIN  0.4 MG SL tablet Commonly known as: NITROSTAT  Place 1 tablet (0.4 mg total) under the tongue every 5 (five) minutes x 3 doses as needed for chest pain.   Omnipod 5 DexG7G6 Pods Gen 5 Misc 60 Units every 3 (three) days.   ondansetron  4 MG tablet Commonly known as: ZOFRAN  Take 1 tablet (4 mg total) by mouth every 6 (six) hours as needed for nausea.   Oxycodone  HCl 10 MG Tabs Take 10 mg by mouth 4 (four) times daily as needed (pain).   pantoprazole  40 MG tablet Commonly known as: PROTONIX  Take 40 mg by mouth daily.   polyethylene glycol 17 g packet Commonly known as: MIRALAX  / GLYCOLAX  Take 17 g by mouth daily.   rosuvastatin  20 MG tablet Commonly known as: CRESTOR  Take 20 mg by mouth at bedtime.   senna 8.6 MG Tabs tablet Commonly known as: SENOKOT Take 1 tablet (8.6 mg total) by mouth at bedtime.   Symbicort 160-4.5 MCG/ACT inhaler Generic drug: budesonide-formoterol SMARTSIG:2 inhalation Via Inhaler Twice Daily PRN   topiramate  200 MG tablet Commonly known as: TOPAMAX  Take 200 mg by mouth at bedtime.   traZODone  100 MG tablet Commonly known as: DESYREL  Take 250 mg  by mouth at bedtime.   TYLENOL  PO Take 2 tablets by mouth 3 (three) times daily as needed (tooth ache).        Follow-up Information     Austin Mutton, MD. Schedule an appointment as soon as possible for a visit in 1 week(s).   Specialty: Internal Medicine Why: With repeat BMP Contact information: [address] [place] [address] [phone]                Allergies  Allergen Reactions   Lyrica [Pregabalin] Shortness Of Breath, Itching and Rash   Rocephin  [Ceftriaxone  Sodium In Dextrose ] Shortness Of Breath   Sulfa Antibiotics Hives, Shortness Of Breath and Itching   Ultram [Tramadol] Hives and Shortness Of Breath    Had asthma attack   Vancomycin Anaphylaxis    Per Dr. Precilla   Glucotrol  Cindee ] Hives   Keflex  [Cephalexin ] Hives   Metformin  Dermatitis   Penicillins Hives   Janumet  [Sitagliptin  Phos-Metformin  Hcl] Nausea Only    Consultations: GI   Procedures/Studies: DG ESOPHAGUS W SINGLE CM (SOL OR  THIN BA) Result Date: 10/25/2024 EXAM: SINGLE CONTRAST ESOPHAGRAM 10/25/2024 10:18:00 AM TECHNIQUE: Multiple single contrast images of the esophagus and gastroesophageal junction were obtained following the oral administration of water soluble contrast FLUOROSCOPY DOSE AND TYPE: Radiation Dose Index: Reference Air Kerma (in mGy) = 67.0 Fluoroscopy time: 3 min 12 sec COMPARISON: None available. CLINICAL HISTORY: Vomiting and sometimes choking when eating/drinking. FINDINGS: Contrast flows rapidly through the esophagus into the stomach through a patent GE junction. No mucosal irregularity identified in the thoracic esophagus or distal esophagus. There was severe esophageal dysmotility with no initiation of the primary stripping wave in supine position and poor initiation in upright position, presumably related to the distal stricture. A 13 mm barium tablet failed to pass the GE junction, suggesting recurrent stricturing of the GE junction. No evidence of leak. No  hiatal hernia is seen and there is no evidence of achalasia. No gastroesophageal reflux was elicited during examination. IMPRESSION: 1. 13 mm barium tablet failed to pass the GE junction, suggesting recurrent stricturing of the GE junction. 2. Severe esophageal dysmotility with poor initiation of the primary stripping wave, presumably related to the distal stricture. Electronically signed by: Norleen Boxer MD 10/25/2024 10:55 AM EDT RP Workstation: [id number]   CT ABDOMEN PELVIS WO CONTRAST Result Date: 10/15/2024 EXAM: CT ABDOMEN AND PELVIS WITHOUT CONTRAST 10/15/2024 08:10:19 PM TECHNIQUE: CT of the abdomen and pelvis was performed without the administration of intravenous contrast. Multiplanar reformatted images are provided for review. Automated exposure control, iterative reconstruction, and/or weight-based adjustment of the mA/kV was utilized to reduce the radiation dose to as low as reasonably achievable. COMPARISON: CT abdomen 10/08/2024 and pelvis CT renal 07/18/2024. CLINICAL HISTORY: Abdominal pain, acute, nonlocalized. FINDINGS: LOWER CHEST: Bibasilar dependent atelectasis. LIVER: The liver is unremarkable. GALLBLADDER AND BILE DUCTS: Gallbladder is unremarkable. No biliary ductal dilatation. SPLEEN: No acute abnormality. PANCREAS: No acute abnormality. ADRENAL GLANDS: No acute abnormality. KIDNEYS, URETERS AND BLADDER: Left nephrolithiasis measuring up to 3 mm. Punctate calcifications noted within bilateral kidneys are likely vascular in etiology. No ureterolithiasis bilaterally. No hydroureteronephrosis bilaterally. Fluid dense lesion of the right kidney likely represents a simple renal cyst. Per consensus, no follow-up is needed for simple Bosniak type 1 and 2 renal cysts, unless the patient has a malignancy history or risk factors. Urinary bladder is unremarkable. GI AND BOWEL: Stomach demonstrates no acute abnormality. The appendix is unremarkable. No small or large bowel wall thickening or  dilatation. Single descending colon diverticula (2:158). PERITONEUM AND RETROPERITONEUM: No ascites. No free air. VASCULATURE: Aorta is normal in caliber. Atherosclerotic plaque. LYMPH NODES: No lymphadenopathy. REPRODUCTIVE ORGANS: Status post hysterectomy. No adnexal mass. BONES AND SOFT TISSUES: Scattered densely sclerotic lesions within the pelvis consistent with bone islands. No acute osseous abnormality. No focal soft tissue abnormality. IMPRESSION: 1. No acute findings in the abdomen or pelvis. 2. Nonobstructive 3 mm left nephrolithiasis. Electronically signed by: Morgane Naveau MD 10/15/2024 08:51 PM EDT RP Workstation: [id number]   DG Chest Port 1 View Result Date: 10/15/2024 EXAM: 1 VIEW XRAY OF THE CHEST 10/15/2024 04:10:00 PM COMPARISON: None available. CLINICAL HISTORY: Questionable sepsis - evaluate for abnormality. Pt c/o dizziness. FINDINGS: LUNGS AND PLEURA: No focal pulmonary opacity. No pulmonary edema. No pleural effusion. No pneumothorax. HEART AND MEDIASTINUM: No acute abnormality of the cardiac and mediastinal silhouettes. BONES AND SOFT TISSUES: No acute osseous abnormality. IMPRESSION: 1. No acute cardiopulmonary findings. Electronically signed by: Harrietta Sherry MD 10/15/2024 04:42 PM EDT RP Workstation: [id number]   CT ABDOMEN PELVIS W CONTRAST  Result Date: 10/08/2024 CLINICAL DATA:  Abdominal pain. Upper abdominal pain with nausea and vomiting. EXAM: CT ABDOMEN AND PELVIS WITH CONTRAST TECHNIQUE: Multidetector CT imaging of the abdomen and pelvis was performed using the standard protocol following bolus administration of intravenous contrast. RADIATION DOSE REDUCTION: This exam was performed according to the departmental dose-optimization program which includes automated exposure control, adjustment of the mA and/or kV according to patient size and/or use of iterative reconstruction technique. CONTRAST:  75mL OMNIPAQUE  IOHEXOL  350 MG/ML SOLN COMPARISON:  07/18/2024 FINDINGS:  Lower chest: Somewhat linear infiltrates in the lung bases probably representing atelectasis. Cardiac enlargement. Hepatobiliary: No focal liver abnormality is seen. No gallstones, gallbladder wall thickening, or biliary dilatation. Pancreas: Unremarkable. No pancreatic ductal dilatation or surrounding inflammatory changes. Spleen: Normal in size without focal abnormality. Adrenals/Urinary Tract: No adrenal gland nodules. 5 mm stone in the upper pole left kidney. Nephrograms are homogeneous and symmetrical. No hydronephrosis or hydroureter. Bladder is decompressed. Stomach/Bowel: Stomach, small bowel, and colon are not abnormally distended. There is wall thickening and edema involving the cecum and ascending colon, extending to the hepatic flexure. Wall thickening is also suggested in the terminal ileum although underdistention limits evaluation. This is likely to represent infectious or inflammatory colitis. Scattered stool throughout the colon. No loculated collections. Appendix is normal. Vascular/Lymphatic: Aortic atherosclerosis. No enlarged abdominal or pelvic lymph nodes. Reproductive: Status post hysterectomy. No adnexal masses. Other: No abdominal wall hernia or abnormality. No abdominopelvic ascites. Musculoskeletal: No acute or significant osseous findings. IMPRESSION: 1. Right colonic and terminal ileum wall thickening and edema without evidence of obstruction. This most likely represents infectious or inflammatory colitis. 2. Nonobstructing stone in the left kidney. 3. Aortic atherosclerosis. Electronically Signed   By: Elsie Gravely M.D.   On: 10/08/2024 19:51   DG Chest Portable 1 View Result Date: 10/08/2024 CLINICAL DATA:  Hyperglycemia. EXAM: PORTABLE CHEST 1 VIEW COMPARISON:  July 18, 2024 FINDINGS: The heart size and mediastinal contours are within normal limits. Mild atelectasis is noted within the right lung base. No acute infiltrate, pleural effusion or pneumothorax is identified. The  visualized skeletal structures are unremarkable. IMPRESSION: No acute cardiopulmonary disease. Electronically Signed   By: Suzen Dials M.D.   On: 10/08/2024 18:10    EGD on 10/26/2024 Impression:               - Normal larynx.                           - Abnormal esophageal motility, suspicious for                            presbyesophagus. Dilated. It did not look like                            frank achalasia but her primary GI in [facility]                            may want to consider either a manometry in the                            future or trying Botox if needed                           -  Normal stomach.                           - Normal duodenal bulb, first portion of the                            duodenum, second portion of the duodenum and third                            portion of the duodenum.                           - No specimens collected. Moderate Sedation:      Not Applicable - Patient had care per Anesthesia. Recommendation:           - Soft diet today. May resume her usual diet if she                            has diabetic or sodium restrictions in the morning                           - Continue present medications.                           - Return to GI clinic PRN. Return to her [facility]                            primary gastroenterologist as needed                           - Telephone GI clinic if symptomatic PRN. Please                            call me if I can be of any further assistance with                            this hospital stay  Subjective: Patient seen and examined at bedside.  Feels better.  Tolerating diet.  Feels okay to go home today.  No fever or vomiting reported.  Discharge Exam: Vitals:   10/27/24 0837 10/27/24 0908  BP: (!) 171/97 (!) 176/100  Pulse:  76  Resp:  16  Temp: 98.5 F (36.9 C)   SpO2:  100%    General: Pt is alert, awake, not in acute distress Cardiovascular: rate controlled, S1/S2  + Respiratory: bilateral decreased breath sounds at bases Abdominal: Soft, obese, NT, ND, bowel sounds + Extremities: no edema, no cyanosis    The results of significant diagnostics from this hospitalization (including imaging, microbiology, ancillary and laboratory) are listed below for reference.     Microbiology: No results found for this or any previous visit (from the past 240 hours).   Labs: BNP (last 3 results) No results for input(s): BNP in the last 8760 hours. Basic Metabolic Panel: Recent Labs  Lab 10/23/24 1558 10/24/24 0447 10/25/24 0344 10/26/24 0411 10/27/24 0428  NA 134* 135 140 137 136  K 3.3* 3.6 3.9 4.0 3.9  CL 95* 105 108 106 105  CO2 22 21* 22 22 22   GLUCOSE 185* 127* 229* 226* 237*  BUN 31* 27* 16 11 12   CREATININE 3.21* 1.75* 1.28* 1.04* 1.27*  CALCIUM  9.8 8.3* 8.9 9.2 9.7  MG  --  1.8 1.8 1.7  --    Liver Function Tests: Recent Labs  Lab 10/23/24 1558  AST 21  ALT 21  ALKPHOS 102  BILITOT 0.5  PROT 8.1  ALBUMIN 4.5   Recent Labs  Lab 10/23/24 1559  LIPASE 32   No results for input(s): AMMONIA in the last 168 hours. CBC: Recent Labs  Lab 10/23/24 1558 10/24/24 0447 10/25/24 0344 10/26/24 0411 10/27/24 0428  WBC 11.1* 8.7 5.8 7.0 6.4  NEUTROABS 7.4  --   --   --   --   HGB 14.6 11.6* 11.2* 12.8 12.5  HCT 44.7 37.0 35.4* 42.0 42.7  MCV 81.1 81.9 83.7 84.7 88.6  PLT 250 194 198 200 200   Cardiac Enzymes: No results for input(s): CKTOTAL, CKMB, CKMBINDEX, TROPONINI in the last 168 hours. BNP: Invalid input(s): POCBNP CBG: Recent Labs  Lab 10/26/24 2013 10/27/24 0042 10/27/24 0408 10/27/24 0720 10/27/24 0952  GLUCAP 221* 188* 224* 204* 234*   D-Dimer No results for input(s): DDIMER in the last 72 hours. Hgb A1c No results for input(s): HGBA1C in the last 72 hours. Lipid Profile No results for input(s): CHOL, HDL, LDLCALC, TRIG, CHOLHDL, LDLDIRECT in the last 72 hours. Thyroid  function  studies No results for input(s): TSH, T4TOTAL, T3FREE, THYROIDAB in the last 72 hours.  Invalid input(s): FREET3 Anemia work up No results for input(s): VITAMINB12, FOLATE, FERRITIN, TIBC, IRON, RETICCTPCT in the last 72 hours. Urinalysis    Component Value Date/Time   COLORURINE AMBER (A) 10/23/2024 0404   APPEARANCEUR TURBID (A) 10/23/2024 0404   LABSPEC 1.021 10/23/2024 0404   PHURINE 5.0 10/23/2024 0404   GLUCOSEU 50 (A) 10/23/2024 0404   HGBUR NEGATIVE 10/23/2024 0404   BILIRUBINUR SMALL (A) 10/23/2024 0404   BILIRUBINUR negative 03/04/2019 0950   BILIRUBINUR neg 02/13/2018 1219   KETONESUR NEGATIVE 10/23/2024 0404   PROTEINUR 100 (A) 10/23/2024 0404   UROBILINOGEN 0.2 12/05/2020 1151   NITRITE NEGATIVE 10/23/2024 0404   LEUKOCYTESUR MODERATE (A) 10/23/2024 0404   Sepsis Labs Recent Labs  Lab 10/24/24 0447 10/25/24 0344 10/26/24 0411 10/27/24 0428  WBC 8.7 5.8 7.0 6.4   Microbiology No results found for this or any previous visit (from the past 240 hours).   Time coordinating discharge: 35 minutes  SIGNED:   Sophie Mao, MD  [facility] 10/27/2024, 10:00 AM

## 2024-10-27 NOTE — Progress Notes (Signed)
   10/27/24 0319  BiPAP/CPAP/SIPAP  BiPAP/CPAP/SIPAP Pt Type Adult  BiPAP/CPAP/SIPAP Resmed  Mask Type Full face mask  Dentures removed? Not applicable  Mask Size Medium  FiO2 (%) 21 %  Patient Home Machine No  Patient Home Mask No  Patient Home Tubing Yes  Auto Titrate Yes  Minimum cmH2O 4 cmH2O  Maximum cmH2O 15 cmH2O  Device Plugged into RED Power Outlet Yes

## 2024-10-27 NOTE — Progress Notes (Signed)
 AVS reviewed with patient and family at bedside. IV removed. Patient getting dressed for D/C

## 2024-10-27 NOTE — Plan of Care (Signed)

## 2024-10-27 NOTE — Anesthesia Postprocedure Evaluation (Signed)
 Anesthesia Post Note  Patient: Stephanie Thornton  Procedure(s) Performed: EGD (ESOPHAGOGASTRODUODENOSCOPY)     Patient location during evaluation: Endoscopy Anesthesia Type: MAC Level of consciousness: awake and alert Pain management: pain level controlled Vital Signs Assessment: post-procedure vital signs reviewed and stable Respiratory status: spontaneous breathing Cardiovascular status: stable Anesthetic complications: no   No notable events documented.  Last Vitals:  Vitals:   10/27/24 1037 10/27/24 1237  BP: (!) 168/97 134/87  Pulse: 75 73  Resp:  17  Temp:  36.9 C  SpO2:  99%    Last Pain:  Vitals:   10/27/24 1237  TempSrc: Oral  PainSc:                  [name]

## 2024-10-27 NOTE — Plan of Care (Signed)

## 2024-10-27 NOTE — Progress Notes (Signed)
 Pt wake with c/o headache and request med for pain Tylendol given and VS taken:    10/27/24 0209  Vitals  Temp 98.6 F (37 C)  Temp Source Oral  BP (!) 178/106  MAP (mmHg) 125  BP Method Automatic  Pulse Rate 77  Pulse Rate Source Monitor  Resp 20   Page NP for addition options. SRP, RN

## 2025-01-20 ENCOUNTER — Emergency Department (HOSPITAL_COMMUNITY)

## 2025-01-20 ENCOUNTER — Other Ambulatory Visit: Payer: Self-pay

## 2025-01-20 ENCOUNTER — Emergency Department (HOSPITAL_COMMUNITY)
Admission: EM | Admit: 2025-01-20 | Discharge: 2025-01-21 | Disposition: A | Discharge status: Home / Self Care | Attending: Emergency Medicine | Admitting: Emergency Medicine

## 2025-01-20 ENCOUNTER — Encounter (HOSPITAL_COMMUNITY): Payer: Self-pay | Admitting: Emergency Medicine

## 2025-01-20 DIAGNOSIS — E1165 Type 2 diabetes mellitus with hyperglycemia: Secondary | ICD-10-CM | POA: Insufficient documentation

## 2025-01-20 DIAGNOSIS — E860 Dehydration: Secondary | ICD-10-CM | POA: Insufficient documentation

## 2025-01-20 DIAGNOSIS — Z87891 Personal history of nicotine dependence: Secondary | ICD-10-CM | POA: Insufficient documentation

## 2025-01-20 DIAGNOSIS — R112 Nausea with vomiting, unspecified: Secondary | ICD-10-CM

## 2025-01-20 DIAGNOSIS — Z7982 Long term (current) use of aspirin: Secondary | ICD-10-CM | POA: Insufficient documentation

## 2025-01-20 DIAGNOSIS — I2510 Atherosclerotic heart disease of native coronary artery without angina pectoris: Secondary | ICD-10-CM | POA: Insufficient documentation

## 2025-01-20 DIAGNOSIS — Z794 Long term (current) use of insulin: Secondary | ICD-10-CM | POA: Insufficient documentation

## 2025-01-20 DIAGNOSIS — R161 Splenomegaly, not elsewhere classified: Secondary | ICD-10-CM | POA: Insufficient documentation

## 2025-01-20 DIAGNOSIS — I10 Essential (primary) hypertension: Secondary | ICD-10-CM | POA: Insufficient documentation

## 2025-01-20 DIAGNOSIS — K760 Fatty (change of) liver, not elsewhere classified: Secondary | ICD-10-CM | POA: Insufficient documentation

## 2025-01-20 DIAGNOSIS — K5900 Constipation, unspecified: Secondary | ICD-10-CM | POA: Insufficient documentation

## 2025-01-20 DIAGNOSIS — Z79899 Other long term (current) drug therapy: Secondary | ICD-10-CM | POA: Insufficient documentation

## 2025-01-20 DIAGNOSIS — R109 Unspecified abdominal pain: Secondary | ICD-10-CM | POA: Insufficient documentation

## 2025-01-20 DIAGNOSIS — R030 Elevated blood-pressure reading, without diagnosis of hypertension: Secondary | ICD-10-CM

## 2025-01-20 LAB — COMPREHENSIVE METABOLIC PANEL WITH GFR
ALT: 19 U/L (ref 0–44)
AST: 17 U/L (ref 15–41)
Albumin: 4.3 g/dL (ref 3.5–5.0)
Alkaline Phosphatase: 159 U/L — ABNORMAL HIGH (ref 38–126)
Anion gap: 14 (ref 5–15)
BUN: 12 mg/dL (ref 6–20)
CO2: 21 mmol/L — ABNORMAL LOW (ref 22–32)
Calcium: 9.8 mg/dL (ref 8.9–10.3)
Chloride: 98 mmol/L (ref 98–111)
Creatinine, Ser: 0.81 mg/dL (ref 0.44–1.00)
GFR, Estimated: >60 mL/min
Glucose, Bld: 247 mg/dL — ABNORMAL HIGH (ref 70–99)
Potassium: 3.5 mmol/L (ref 3.5–5.1)
Sodium: 133 mmol/L — ABNORMAL LOW (ref 135–145)
Total Bilirubin: 0.4 mg/dL (ref 0.0–1.2)
Total Protein: 8.4 g/dL — ABNORMAL HIGH (ref 6.5–8.1)

## 2025-01-20 LAB — I-STAT CHEM 8, ED
BUN: 18 mg/dL (ref 6–20)
Calcium, Ion: 1.15 mmol/L (ref 1.15–1.40)
Chloride: 100 mmol/L (ref 98–111)
Creatinine, Ser: 0.8 mg/dL (ref 0.44–1.00)
Glucose, Bld: 277 mg/dL — ABNORMAL HIGH (ref 70–99)
HCT: 57 % — ABNORMAL HIGH (ref 36.0–46.0)
Hemoglobin: 19.4 g/dL — ABNORMAL HIGH (ref 12.0–15.0)
Potassium: 5.7 mmol/L — ABNORMAL HIGH (ref 3.5–5.1)
Sodium: 133 mmol/L — ABNORMAL LOW (ref 135–145)
TCO2: 23 mmol/L (ref 22–32)

## 2025-01-20 LAB — CBC WITH DIFFERENTIAL/PLATELET
Abs Immature Granulocytes: 0.04 K/uL (ref 0.00–0.07)
Basophils Absolute: 0 K/uL (ref 0.0–0.1)
Basophils Relative: 0 %
Eosinophils Absolute: 0.1 K/uL (ref 0.0–0.5)
Eosinophils Relative: 1 %
HCT: 52.5 % — ABNORMAL HIGH (ref 36.0–46.0)
Hemoglobin: 17.3 g/dL — ABNORMAL HIGH (ref 12.0–15.0)
Immature Granulocytes: 0 %
Lymphocytes Relative: 14 %
Lymphs Abs: 1.4 K/uL (ref 0.7–4.0)
MCH: 26.8 pg (ref 26.0–34.0)
MCHC: 33 g/dL (ref 30.0–36.0)
MCV: 81.4 fL (ref 80.0–100.0)
Monocytes Absolute: 0.3 K/uL (ref 0.1–1.0)
Monocytes Relative: 3 %
Neutro Abs: 8.3 K/uL — ABNORMAL HIGH (ref 1.7–7.7)
Neutrophils Relative %: 82 %
Platelets: 242 K/uL (ref 150–400)
RBC: 6.45 MIL/uL — ABNORMAL HIGH (ref 3.87–5.11)
RDW: 14.2 % (ref 11.5–15.5)
WBC: 10.2 K/uL (ref 4.0–10.5)
nRBC: 0 % (ref 0.0–0.2)

## 2025-01-20 LAB — URINALYSIS, ROUTINE W REFLEX MICROSCOPIC
Bacteria, UA: NONE SEEN
Bilirubin Urine: NEGATIVE
Glucose, UA: >=500 mg/dL — AB
Hgb urine dipstick: NEGATIVE
Ketones, ur: NEGATIVE mg/dL
Nitrite: NEGATIVE
Protein, ur: 30 mg/dL — AB
Specific Gravity, Urine: 1.039 — ABNORMAL HIGH (ref 1.005–1.030)
pH: 8 (ref 5.0–8.0)

## 2025-01-20 LAB — RESP PANEL BY RT-PCR (RSV, FLU A&B, COVID)  RVPGX2
Influenza A by PCR: NEGATIVE
Influenza B by PCR: NEGATIVE
Resp Syncytial Virus by PCR: NEGATIVE
SARS Coronavirus 2 by RT PCR: NEGATIVE

## 2025-01-20 LAB — CBG MONITORING, ED: Glucose-Capillary: 253 mg/dL — ABNORMAL HIGH (ref 70–99)

## 2025-01-20 LAB — LIPASE, BLOOD: Lipase: 24 U/L (ref 11–51)

## 2025-01-20 MED ORDER — SODIUM CHLORIDE 0.9 % IV BOLUS
1000.0000 mL | Freq: Once | INTRAVENOUS | Status: AC
  Administered 2025-01-20: 1000 mL via INTRAVENOUS

## 2025-01-20 MED ORDER — IOHEXOL 300 MG/ML  SOLN
100.0000 mL | Freq: Once | INTRAMUSCULAR | Status: AC | PRN
  Administered 2025-01-20: 100 mL via INTRAVENOUS

## 2025-01-20 MED ORDER — ONDANSETRON HCL 4 MG/2ML IJ SOLN
4.0000 mg | Freq: Once | INTRAMUSCULAR | Status: AC
  Administered 2025-01-20: 4 mg via INTRAVENOUS
  Filled 2025-01-20: qty 2

## 2025-01-20 MED ORDER — FAMOTIDINE IN NACL 20-0.9 MG/50ML-% IV SOLN
20.0000 mg | Freq: Once | INTRAVENOUS | Status: AC
  Administered 2025-01-20: 20 mg via INTRAVENOUS
  Filled 2025-01-20: qty 50

## 2025-01-20 MED ORDER — SUCRALFATE 1 G PO TABS
1.0000 g | ORAL_TABLET | Freq: Three times a day (TID) | ORAL | 0 refills | Status: AC
  Filled 2025-01-20: qty 21, 7d supply, fill #0

## 2025-01-20 MED ORDER — ONDANSETRON HCL 4 MG PO TABS
4.0000 mg | ORAL_TABLET | Freq: Four times a day (QID) | ORAL | 0 refills | Status: AC | PRN
  Filled 2025-01-20: qty 12, 3d supply, fill #0

## 2025-01-20 MED ORDER — ONDANSETRON 4 MG PO TBDP
4.0000 mg | ORAL_TABLET | Freq: Once | ORAL | Status: AC
  Administered 2025-01-20: 4 mg via ORAL
  Filled 2025-01-20: qty 1

## 2025-01-20 MED ORDER — HYDRALAZINE HCL 20 MG/ML IJ SOLN
10.0000 mg | Freq: Once | INTRAMUSCULAR | Status: AC
  Administered 2025-01-20: 10 mg via INTRAVENOUS
  Filled 2025-01-20: qty 1

## 2025-01-20 NOTE — ED Triage Notes (Signed)
 Pt presents from home with n/v and had a CBG at home of 300.  Pt reports her family gave her some things of insulin  , does not know what.  CBG with ems 341.  Pt is hypertensive with EMS and at time of arrival.

## 2025-01-20 NOTE — ED Provider Notes (Signed)
 " [place] EMERGENCY DEPARTMENT AT [facility] Provider Note  CSN: [id number] Arrival date & time: 01/20/25 1542  Chief Complaint(s) Hyperglycemia and Hypertension  HPI Farzana Koci is a 54 y.o. female with past medical history as below, significant for hypertension, bipolar, CAD, depression, GERD, OSA on CPAP who presents to the ED with complaint of nausea, vomiting, elevated blood pressure  Reports that she woke up this morning was feeling unwell, she was having nausea, abdominal cramping.  Vomited a few times.  Last episode emesis was around 2 hours ago.  She tried to take her regular medications but vomited shortly after.  Tried taking an antiemetic but also vomited.  No change in bowel or bladder function.  No blood in stool or melanotic stool.  No change to urine.  No polydipsia.  No fevers or recent travel, no sick contacts.  No suspicious p.o. intake.  Past Medical History Past Medical History:  Diagnosis Date   Anxiety    Arthritis    knees, feet, back (05/24/2015)   Benign essential HTN 05/01/2015   Bipolar disorder (HCC) diagnosed early 90s   CAD in native artery    Chronic bronchitis (HCC)    get it q yr   Chronic lower back pain    Depression    Diabetes mellitus type II 2010   GERD (gastroesophageal reflux disease)    Headache    maybe 3 times/wk (05/24/2015)   History of hiatal hernia    History of stomach ulcers    HLD (hyperlipidemia)    Menopause    Migraine    1-2/wk (05/24/2015)   NSTEMI (non-ST elevated myocardial infarction) (HCC) 04/2022   OSA on CPAP    Stroke (HCC)    r sided weakness   Patient Active Problem List   Diagnosis Date Noted   UTI (urinary tract infection) 07/21/2024   History of stroke 07/18/2024   Syncope 10/09/2023   Acute metabolic encephalopathy 09/07/2023   Medically noncompliant 09/07/2023   GERD (gastroesophageal reflux disease) 09/07/2023   Uncontrolled type 2 diabetes mellitus with hyperglycemia (HCC)  08/27/2022   Splenic lesion 08/27/2022   Diabetic ketoacidosis without coma associated with type 2 diabetes mellitus (HCC) 07/07/2022   Acute pancreatitis without infection or necrosis 07/07/2022   AKI (acute kidney injury) 07/07/2022   Hypercalcemia 07/07/2022   Coronary artery disease 07/07/2022   Thyroid  nodule 07/07/2022   OSA (obstructive sleep apnea)    Reflux esophagitis    HLD (hyperlipidemia) 05/21/2022   NSTEMI (non-ST elevated myocardial infarction) (HCC) 05/07/2022   Essential hypertension    Trigger thumb of left hand 10/15/2017   Chronic pain disorder 06/24/2017   Lumbar degenerative disc disease 06/24/2017   Tobacco use disorder 06/24/2017   Ganglion cyst 03/04/2017   Acquired trigger finger 10/22/2016   Snapping hip, right 10/26/2015   Depression    Diabetes type 2, controlled (HCC)    Chronic tonsillitis 05/24/2015   Abnormal EKG 05/01/2015   Right-sided low back pain without sciatica 01/25/2015   Hx of peritonsillar abscess drainage 01/03/2015   Obesity (BMI 30-39.9) 10/09/2014   DM (diabetes mellitus) (HCC) 09/10/2013   Bipolar disorder (HCC)    Severe bipolar I disorder, current or most recent episode depressed (HCC) 11/26/2011   Home Medication(s) Prior to Admission medications  Medication Sig Start Date End Date Taking? Authorizing Provider  ondansetron  (ZOFRAN ) 4 MG tablet Take 1 tablet (4 mg total) by mouth every 6 (six) hours as needed for nausea or vomiting. 01/20/25  Yes Elnor Savant A, DO  sucralfate  (CARAFATE ) 1 g tablet Take 1 tablet (1 g total) by mouth with breakfast, with lunch, and with evening meal. 01/20/25 01/27/25 Yes Elnor Savant LABOR, DO  Acetaminophen  (TYLENOL  PO) Take 2 tablets by mouth 3 (three) times daily as needed (tooth ache).    [provider]  amitriptyline  (ELAVIL ) 150 MG tablet Take 150 mg by mouth at bedtime. 08/06/22   [provider]  amLODipine  (NORVASC ) 10 MG tablet Take 1 tablet (10 mg total) by mouth daily.  09/23/22   Shlomo Wilbert SAUNDERS, MD  aspirin  81 MG chewable tablet Chew 1 tablet (81 mg total) by mouth daily. 05/09/22   Zhao, Xika, NP  atenolol  (TENORMIN ) 50 MG tablet Take 50 mg by mouth daily. 10/24/23   [provider]  baclofen  (LIORESAL ) 10 MG tablet Take 10 mg by mouth 3 (three) times daily. 05/05/22   [provider]  busPIRone  (BUSPAR ) 15 MG tablet Take 15 mg by mouth 3 (three) times daily. 07/10/24   [provider]  Continuous Glucose Sensor (DEXCOM G7 SENSOR) MISC SMARTSIG:1 Every 10 Days 10/01/24   [provider]  escitalopram  (LEXAPRO ) 5 MG tablet Take 5 mg by mouth every morning. 07/10/24   [provider]  HUMALOG 100 UNIT/ML injection 60 UNITS DAILY VIA INSULIN  PUMP INJECTION 30 DAYS    [provider]  hydrOXYzine  (ATARAX ) 25 MG tablet TAKE 1 TABLET BY ORAL ROUTE 3 TIMES PER DAY TAKE FOR ANXIETY, PANIC, AGITATION, AND SLEEP AS NEEDED    [provider]  Insulin  Disposable Pump (OMNIPOD 5 DEXG7G6 PODS GEN 5) MISC 60 Units every 3 (three) days. 10/03/24   [provider]  Insulin  Pen Needle 31G X 5 MM MISC 1 Units by Does not apply route 4 (four) times daily. 09/07/23   Rojelio Nest, DO  LATUDA  80 MG TABS tablet Take 80 mg by mouth every evening. 02/01/19   [provider]  nitroGLYCERIN  (NITROSTAT ) 0.4 MG SL tablet Place 1 tablet (0.4 mg total) under the tongue every 5 (five) minutes x 3 doses as needed for chest pain. 05/09/22   Zhao, Xika, NP  Oxycodone  HCl 10 MG TABS Take 10 mg by mouth 4 (four) times daily as needed (pain). 07/08/22   [provider]  pantoprazole  (PROTONIX ) 40 MG tablet Take 40 mg by mouth daily. 02/19/24   [provider]  polyethylene glycol (MIRALAX  / GLYCOLAX ) 17 g packet Take 17 g by mouth daily. 10/19/24   Franchot Novel, MD  rosuvastatin  (CRESTOR ) 20 MG tablet Take 20 mg by mouth at bedtime. 07/29/23   [provider]  senna (SENOKOT) 8.6 MG TABS tablet Take 1  tablet (8.6 mg total) by mouth at bedtime. 10/18/24   Franchot Novel, MD  SYMBICORT 160-4.5 MCG/ACT inhaler SMARTSIG:2 inhalation Via Inhaler Twice Daily PRN 04/24/24   [provider]  topiramate  (TOPAMAX ) 200 MG tablet Take 200 mg by mouth at bedtime. 01/28/19   [provider]  traZODone  (DESYREL ) 100 MG tablet Take 250 mg by mouth at bedtime. 02/24/19   [provider]  Past Surgical History Past Surgical History:  Procedure Laterality Date   ABDOMINAL HYSTERECTOMY  2007   BUNIONECTOMY Bilateral    CARPAL TUNNEL RELEASE Right    CORONARY STENT INTERVENTION N/A 05/07/2022   Procedure: CORONARY STENT INTERVENTION;  Surgeon: Claudene Victory ORN, MD;  Location: [facility] INVASIVE CV LAB;  Service: Cardiovascular;  Laterality: N/A;   ESOPHAGOGASTRODUODENOSCOPY N/A 10/26/2024   Procedure: EGD (ESOPHAGOGASTRODUODENOSCOPY);  Surgeon: Rosalie Kitchens, MD;  Location: [facility];  Service: Gastroenterology;  Laterality: N/A;  Might need dilation possibly Botox    HAMMER TOE SURGERY Left    LEFT HEART CATH AND CORONARY ANGIOGRAPHY N/A 05/20/2018   Procedure: LEFT HEART CATH AND CORONARY ANGIOGRAPHY;  Surgeon: Anner Alm ORN, MD;  Location: [facility] INVASIVE CV LAB;  Service: Cardiovascular;  Laterality: N/A;   LEFT HEART CATH AND CORONARY ANGIOGRAPHY N/A 05/07/2022   Procedure: LEFT HEART CATH AND CORONARY ANGIOGRAPHY;  Surgeon: Claudene Victory ORN, MD;  Location: [facility] INVASIVE CV LAB;  Service: Cardiovascular;  Laterality: N/A;   TONSILLECTOMY Bilateral 05/24/2015   Procedure: TONSILLECTOMY;  Surgeon: Vaughan Ricker, MD;  Location: [facility] OR;  Service: ENT;  Laterality: Bilateral;   Family History Family History  Problem Relation Age of Onset   Bipolar disorder Mother    CVA Mother    Bipolar disorder Son    Depression Brother        schizoaffective d/o   Hypertension Sister     Heart disease Maternal Aunt    Heart attack Maternal Aunt    Hypertension Brother     Social History Social History[1] Allergies Pregabalin, Rocephin  [ceftriaxone  sodium in dextrose ], Sulfa antibiotics, Tramadol, Vancomycin, Glucotrol  [glipizide ], Keflex  [cephalexin ], Metformin , Penicillins, and Janumet  [sitagliptin  phos-metformin  hcl]  Review of Systems A thorough review of systems was obtained and all systems are negative except as noted in the HPI and PMH.   Physical Exam Vital Signs  I have reviewed the triage vital signs BP (!) 168/92 (BP Location: Left Arm)   Pulse 83   Temp 98.8 F (37.1 C) (Oral)   Resp 18   SpO2 99%  Physical Exam Vitals and nursing note reviewed.  Constitutional:      General: She is not in acute distress.    Appearance: Normal appearance. She is well-developed. She is not ill-appearing.  HENT:     Head: Normocephalic and atraumatic.     Right Ear: External ear normal.     Left Ear: External ear normal.     Nose: Nose normal.     Mouth/Throat:     Mouth: Mucous membranes are moist.  Eyes:     General: No scleral icterus.       Right eye: No discharge.        Left eye: No discharge.  Cardiovascular:     Rate and Rhythm: Normal rate.  Pulmonary:     Effort: Pulmonary effort is normal. No respiratory distress.     Breath sounds: No stridor.  Abdominal:     General: Abdomen is flat. There is no distension.     Palpations: Abdomen is soft.     Tenderness: There is generalized abdominal tenderness. There is no guarding.  Musculoskeletal:        General: No deformity.     Cervical back: No rigidity.  Skin:    General: Skin is warm and dry.     Coloration: Skin is not cyanotic, jaundiced or pale.  Neurological:     Mental Status: She is alert.  Psychiatric:  Speech: Speech normal.        Behavior: Behavior normal. Behavior is cooperative.     ED Results and Treatments Labs (all labs ordered are listed, but only abnormal  results are displayed) Labs Reviewed  CBC WITH DIFFERENTIAL/PLATELET - Abnormal; Notable for the following components:      Result Value   RBC 6.45 (*)    Hemoglobin 17.3 (*)    HCT 52.5 (*)    Neutro Abs 8.3 (*)    All other components within normal limits  URINALYSIS, ROUTINE W REFLEX MICROSCOPIC - Abnormal; Notable for the following components:   APPearance HAZY (*)    Specific Gravity, Urine 1.039 (*)    Glucose, UA >=500 (*)    Protein, ur 30 (*)    Leukocytes,Ua LARGE (*)    All other components within normal limits  COMPREHENSIVE METABOLIC PANEL WITH GFR - Abnormal; Notable for the following components:   Sodium 133 (*)    CO2 21 (*)    Glucose, Bld 247 (*)    Total Protein 8.4 (*)    Alkaline Phosphatase 159 (*)    All other components within normal limits  CBG MONITORING, ED - Abnormal; Notable for the following components:   Glucose-Capillary 253 (*)    All other components within normal limits  I-STAT CHEM 8, ED - Abnormal; Notable for the following components:   Sodium 133 (*)    Potassium 5.7 (*)    Glucose, Bld 277 (*)    Hemoglobin 19.4 (*)    HCT 57.0 (*)    All other components within normal limits  RESP PANEL BY RT-PCR (RSV, FLU A&B, COVID)  RVPGX2  LIPASE, BLOOD                                                                                                                          Radiology CT ABDOMEN PELVIS W CONTRAST Result Date: 01/20/2025 EXAM: CT ABDOMEN AND PELVIS WITH CONTRAST 01/20/2025 05:57:52 PM TECHNIQUE: CT of the abdomen and pelvis was performed with the administration of 100 mL of iohexol  (OMNIPAQUE ) 300 MG/ML solution. Multiplanar reformatted images are provided for review. Automated exposure control, iterative reconstruction, and/or weight-based adjustment of the mA/kV was utilized to reduce the radiation dose to as low as reasonably achievable. COMPARISON: 10/15/2024, 10/08/2024 chest, 08/27/2022 CLINICAL HISTORY: Epigastric pain. FINDINGS:  LOWER CHEST: Posterior bibasilar dependent atelectasis. LIVER: Diffuse hepatic steatosis. GALLBLADDER AND BILE DUCTS: Gallbladder is unremarkable. No biliary ductal dilatation. SPLEEN: Similar appearance of a lobular hypodense mass in the spleen measuring 3.3 cm (previously, 2.7 cm). PANCREAS: No acute abnormality. ADRENAL GLANDS: No acute abnormality. KIDNEYS, URETERS AND BLADDER: 1 cm cyst in the upper pole of the right kidney. Per consensus, no follow-up is needed for simple Bosniak type 1 and 2 renal cysts, unless the patient has a malignancy history or risk factors. Nonobstructive left upper pole calculus. No hydronephrosis. No perinephric or periureteral stranding. The urinary bladder is distended without focal abnormality. GI  AND BOWEL: Stomach demonstrates no acute abnormality. There is no bowel obstruction. Normal appendix. Moderate volume fecal loading throughout the colon. PERITONEUM AND RETROPERITONEUM: No ascites. No free air. No free pelvic fluid. VASCULATURE: Aorta is normal in caliber. Diffuse aortoiliac atherosclerosis. LYMPH NODES: No lymphadenopathy. REPRODUCTIVE ORGANS: Hysterectomy. BONES AND SOFT TISSUES: Multilevel thoracolumbar osteophytosis. Bone islands again noted in the pelvis. No acute osseous abnormality. No focal soft tissue abnormality. IMPRESSION: 1. No acute findings in the abdomen or pelvis. 2. Redemonstrated lobulated hypodense splenic mass. This has slightly increased in size in the interim to 3.3 cm (previously 2.7 cm), this likely represents a benign lesion, possibly a hemangioma. Nonemergent contrast-enhanced abdominal magnetic resonance imaging recommended for confirmation. 3. Moderate volume fecal loading throughout the colon, as can be seen in constipation. 4. Hepatic steatosis. Electronically signed by: Rogelia Myers MD 01/20/2025 06:26 PM EST RP Workstation: [id number]    Pertinent labs & imaging results that were available during my care of the patient were reviewed  by me and considered in my medical decision making (see MDM for details).  Medications Ordered in ED Medications  ondansetron  (ZOFRAN -ODT) disintegrating tablet 4 mg (has no administration in time range)  sodium chloride  0.9 % bolus 1,000 mL (0 mLs Intravenous Stopped 01/20/25 2227)  ondansetron  (ZOFRAN ) injection 4 mg (4 mg Intravenous Given 01/20/25 1716)  famotidine  (PEPCID ) IVPB 20 mg premix (0 mg Intravenous Stopped 01/20/25 1752)  hydrALAZINE  (APRESOLINE ) injection 10 mg (10 mg Intravenous Given 01/20/25 1717)  iohexol  (OMNIPAQUE ) 300 MG/ML solution 100 mL (100 mLs Intravenous Contrast Given 01/20/25 1745)                                                                                                                                     Procedures Procedures  (including critical care time)  Medical Decision Making / ED Course    Medical Decision Making:    Jaryn Hocutt is a 54 y.o. female with past medical history as below, significant for hypertension, bipolar, CAD, depression, GERD, OSA on CPAP who presents to the ED with complaint of nausea, vomiting, elevated blood pressure. The complaint involves an extensive differential diagnosis and also carries with it a high risk of complications and morbidity.  Serious etiology was considered. Ddx includes but is not limited to: Differential diagnosis includes but is not exclusive to acute cholecystitis, intrathoracic causes for epigastric abdominal pain, gastritis, duodenitis, pancreatitis, small bowel or large bowel obstruction, abdominal aortic aneurysm, hernia, gastritis, etc.   Complete initial physical exam performed, notably the patient was in no acute distress.    Reviewed and confirmed nursing documentation for past medical history, family history, social history.  Vital signs reviewed.    Nausea, vomiting, abdominal pain> - Onset of symptoms this morning, no diarrhea.  No fevers.  No evidence of bleeding - Labs > stable -  Antiemetic, fluids, Pepcid >> feeling much better, tolerating p.o., HDS.  Abdomen benign -  CT abdomen pelvis with incidental findings discussed with the patient.  Stable  Elevated blood pressure reading> - No chest pain, syncope neurologic changes.  Vomited after taking her blood pressure medicine this morning. - Does not appear to have hypertensive emergency - Give dose of antihypertensive - Blood pressure improved  Hyperglycemia > - glucose mildly elevated, bicarb is 21, no ketones in urine patient does not have DKA or HHS  Clinical Course as of 01/20/25 2324  Thu Jan 20, 2025  1919 CT reviewed, stable. Incidental findings noted [SG]  2002 Delay in CMP  Pt reports feeling better overall [SG]    Clinical Course User Index [SG] Elnor Jayson LABOR, DO     11:24 PM:  I have discussed the diagnosis/risks/treatment options with the patient.  Evaluation and diagnostic testing in the emergency department does not suggest an emergent condition requiring admission or immediate intervention beyond what has been performed at this time.  They will follow up with PCP. We also discussed returning to the ED immediately if new or worsening sx occur. We discussed the sx which are most concerning (e.g., sudden worsening pain, fever, inability to tolerate by mouth) that necessitate immediate return.    The patient appears reasonably screened and/or stabilized for discharge and I doubt any other medical condition or other [facility] requiring further screening, evaluation, or treatment in the ED at this time prior to discharge.                 Additional history obtained: -Additional history obtained from na -External records from outside source obtained and reviewed including: Chart review including previous notes, labs, imaging, consultation notes including  Recent admission Prior labs Recent EGD   Lab Tests: -I ordered, reviewed, and interpreted labs.   The pertinent results include:   Labs  Reviewed  CBC WITH DIFFERENTIAL/PLATELET - Abnormal; Notable for the following components:      Result Value   RBC 6.45 (*)    Hemoglobin 17.3 (*)    HCT 52.5 (*)    Neutro Abs 8.3 (*)    All other components within normal limits  URINALYSIS, ROUTINE W REFLEX MICROSCOPIC - Abnormal; Notable for the following components:   APPearance HAZY (*)    Specific Gravity, Urine 1.039 (*)    Glucose, UA >=500 (*)    Protein, ur 30 (*)    Leukocytes,Ua LARGE (*)    All other components within normal limits  COMPREHENSIVE METABOLIC PANEL WITH GFR - Abnormal; Notable for the following components:   Sodium 133 (*)    CO2 21 (*)    Glucose, Bld 247 (*)    Total Protein 8.4 (*)    Alkaline Phosphatase 159 (*)    All other components within normal limits  CBG MONITORING, ED - Abnormal; Notable for the following components:   Glucose-Capillary 253 (*)    All other components within normal limits  I-STAT CHEM 8, ED - Abnormal; Notable for the following components:   Sodium 133 (*)    Potassium 5.7 (*)    Glucose, Bld 277 (*)    Hemoglobin 19.4 (*)    HCT 57.0 (*)    All other components within normal limits  RESP PANEL BY RT-PCR (RSV, FLU A&B, COVID)  RVPGX2  LIPASE, BLOOD    Notable for labs are stable, hemoconcentration noted.  EKG   EKG Interpretation Date/Time:  Thursday January 20 2025 18:09:04 EST Ventricular Rate:  91 PR Interval:  213 QRS Duration:  91 QT Interval:  430 QTC Calculation: 530 R Axis:   1  Text Interpretation: Sinus rhythm Prolonged PR interval Biatrial enlargement Consider anterior infarct Prolonged QT interval similar to prior Confirmed by Elnor Savant ([id number]) on 01/20/2025 7:24:21 PM         Imaging Studies ordered: I ordered imaging studies including CTAP I independently visualized the following imaging with scope of interpretation limited to determining acute life threatening conditions related to emergency care; findings noted above I agree with the  radiologist interpretation If any imaging was obtained with contrast I closely monitored patient for any possible adverse reaction a/w contrast administration in the emergency department   Medicines ordered and prescription drug management: Meds ordered this encounter  Medications   sodium chloride  0.9 % bolus 1,000 mL   ondansetron  (ZOFRAN ) injection 4 mg   famotidine  (PEPCID ) IVPB 20 mg premix   hydrALAZINE  (APRESOLINE ) injection 10 mg   iohexol  (OMNIPAQUE ) 300 MG/ML solution 100 mL   ondansetron  (ZOFRAN -ODT) disintegrating tablet 4 mg   ondansetron  (ZOFRAN ) 4 MG tablet    Sig: Take 1 tablet (4 mg total) by mouth every 6 (six) hours as needed for nausea or vomiting.    Dispense:  12 tablet    Refill:  0   sucralfate  (CARAFATE ) 1 g tablet    Sig: Take 1 tablet (1 g total) by mouth with breakfast, with lunch, and with evening meal.    Dispense:  21 tablet    Refill:  0    -I have reviewed the patients home medicines and have made adjustments as needed   Consultations Obtained: Not applicable  Cardiac Monitoring: Continuous pulse oximetry interpreted by myself, 99% on RA.    Social Determinants of Health:  Diagnosis or treatment significantly limited by social determinants of health: former smoker   Reevaluation: After the interventions noted above, I reevaluated the patient and found that they have improved  Co morbidities that complicate the patient evaluation  Past Medical History:  Diagnosis Date   Anxiety    Arthritis    knees, feet, back (05/24/2015)   Benign essential HTN 05/01/2015   Bipolar disorder (HCC) diagnosed early 90s   CAD in native artery    Chronic bronchitis (HCC)    get it q yr   Chronic lower back pain    Depression    Diabetes mellitus type II 2010   GERD (gastroesophageal reflux disease)    Headache    maybe 3 times/wk (05/24/2015)   History of hiatal hernia    History of stomach ulcers    HLD (hyperlipidemia)    Menopause     Migraine    1-2/wk (05/24/2015)   NSTEMI (non-ST elevated myocardial infarction) (HCC) 04/2022   OSA on CPAP    Stroke (HCC)    r sided weakness      Dispostion: Disposition decision including need for hospitalization was considered, and patient discharged from emergency department.    Final Clinical Impression(s) / ED Diagnoses Final diagnoses:  Abdominal pain, unspecified abdominal location  Nausea and vomiting, unspecified vomiting type  Hepatic steatosis  Splenic mass  Constipation, unspecified constipation type  Mild dehydration  Elevated blood pressure reading         [1]  Social History Tobacco Use   Smoking status: Former    Current packs/day: 0.00    Average packs/day: 1 pack/day for 30.0 years (30.0 ttl pk-yrs)    Types: Cigarettes    Start date: 09/22/1992    Quit date: 09/22/2022    Years  since quitting: 2.3   Smokeless tobacco: Never  Vaping Use   Vaping status: Never Used  Substance Use Topics   Alcohol use: No   Drug use: No     Elnor Jayson LABOR, DO 01/20/25 2324  "

## 2025-01-20 NOTE — ED Notes (Signed)
 Discharge instructions reviewed.   Newly prescribed medications discussed. Pharmacy verified.   Opportunity for questions and concerns provided.   Alert, oriented and ambulatory.   Displays no signs of distress.

## 2025-01-20 NOTE — Discharge Instructions (Addendum)
 It was a pleasure caring for you today in the emergency department.  There was an abnormality noted to your spleen that has been seen previously, this is likely a benign lesion. I recommend you follow up with pcp for outpatient MRI to further evaluate  You should return to the hospital if you experience return of persistent nausea and vomiting that does not resolve and does not allow you to tolerate any food or fluids, persistent fevers for greater than 2-3 more days, increasing abdominal pain that persists despite medications, persistent diarrhea, dizziness, syncope (fainting), or for any other concerns.    Please return to the emergency department immediately for any new or concerning symptoms, or if you get worse.

## 2025-01-21 ENCOUNTER — Other Ambulatory Visit: Payer: Self-pay

## 2025-01-21 ENCOUNTER — Other Ambulatory Visit (HOSPITAL_COMMUNITY): Payer: Self-pay

## 2025-01-22 ENCOUNTER — Other Ambulatory Visit: Payer: Self-pay

## 2025-01-22 ENCOUNTER — Emergency Department (HOSPITAL_COMMUNITY)
Admission: EM | Admit: 2025-01-22 | Discharge: 2025-01-22 | Disposition: A | Discharge status: Home / Self Care | Attending: Emergency Medicine | Admitting: Emergency Medicine

## 2025-01-22 ENCOUNTER — Encounter (HOSPITAL_COMMUNITY): Payer: Self-pay | Admitting: *Deleted

## 2025-01-22 DIAGNOSIS — R319 Hematuria, unspecified: Secondary | ICD-10-CM | POA: Diagnosis present

## 2025-01-22 DIAGNOSIS — Z7982 Long term (current) use of aspirin: Secondary | ICD-10-CM | POA: Diagnosis not present

## 2025-01-22 DIAGNOSIS — Z794 Long term (current) use of insulin: Secondary | ICD-10-CM | POA: Insufficient documentation

## 2025-01-22 DIAGNOSIS — E860 Dehydration: Secondary | ICD-10-CM | POA: Diagnosis not present

## 2025-01-22 DIAGNOSIS — Z79899 Other long term (current) drug therapy: Secondary | ICD-10-CM | POA: Insufficient documentation

## 2025-01-22 LAB — CBC WITH DIFFERENTIAL/PLATELET
Abs Immature Granulocytes: 0.04 10*3/uL (ref 0.00–0.07)
Basophils Absolute: 0 10*3/uL (ref 0.0–0.1)
Basophils Relative: 0 %
Eosinophils Absolute: 0.4 10*3/uL (ref 0.0–0.5)
Eosinophils Relative: 3 %
HCT: 46.6 % — ABNORMAL HIGH (ref 36.0–46.0)
Hemoglobin: 14.6 g/dL (ref 12.0–15.0)
Immature Granulocytes: 0 %
Lymphocytes Relative: 18 %
Lymphs Abs: 2.6 10*3/uL (ref 0.7–4.0)
MCH: 26.4 pg (ref 26.0–34.0)
MCHC: 31.3 g/dL (ref 30.0–36.0)
MCV: 84.1 fL (ref 80.0–100.0)
Monocytes Absolute: 0.8 10*3/uL (ref 0.1–1.0)
Monocytes Relative: 6 %
Neutro Abs: 10.2 10*3/uL — ABNORMAL HIGH (ref 1.7–7.7)
Neutrophils Relative %: 73 %
Platelets: 226 10*3/uL (ref 150–400)
RBC: 5.54 MIL/uL — ABNORMAL HIGH (ref 3.87–5.11)
RDW: 14.4 % (ref 11.5–15.5)
WBC: 14 10*3/uL — ABNORMAL HIGH (ref 4.0–10.5)
nRBC: 0 % (ref 0.0–0.2)

## 2025-01-22 LAB — URINALYSIS, ROUTINE W REFLEX MICROSCOPIC
Bilirubin Urine: NEGATIVE
Glucose, UA: NEGATIVE mg/dL
Ketones, ur: NEGATIVE mg/dL
Nitrite: NEGATIVE
Protein, ur: 100 mg/dL — AB
RBC / HPF: >50 RBC/hpf (ref 0–5)
Specific Gravity, Urine: 1.02 (ref 1.005–1.030)
WBC, UA: >50 WBC/hpf (ref 0–5)
pH: 5 (ref 5.0–8.0)

## 2025-01-22 LAB — COMPREHENSIVE METABOLIC PANEL WITH GFR
ALT: 15 U/L (ref 0–44)
AST: 14 U/L — ABNORMAL LOW (ref 15–41)
Albumin: 3.9 g/dL (ref 3.5–5.0)
Alkaline Phosphatase: 132 U/L — ABNORMAL HIGH (ref 38–126)
Anion gap: 12 (ref 5–15)
BUN: 29 mg/dL — ABNORMAL HIGH (ref 6–20)
CO2: 23 mmol/L (ref 22–32)
Calcium: 9.4 mg/dL (ref 8.9–10.3)
Chloride: 97 mmol/L — ABNORMAL LOW (ref 98–111)
Creatinine, Ser: 1.89 mg/dL — ABNORMAL HIGH (ref 0.44–1.00)
GFR, Estimated: 31 mL/min — ABNORMAL LOW
Glucose, Bld: 214 mg/dL — ABNORMAL HIGH (ref 70–99)
Potassium: 3.3 mmol/L — ABNORMAL LOW (ref 3.5–5.1)
Sodium: 132 mmol/L — ABNORMAL LOW (ref 135–145)
Total Bilirubin: 0.3 mg/dL (ref 0.0–1.2)
Total Protein: 7.4 g/dL (ref 6.5–8.1)

## 2025-01-22 LAB — CBG MONITORING, ED: Glucose-Capillary: 167 mg/dL — ABNORMAL HIGH (ref 70–99)

## 2025-01-22 LAB — TYPE AND SCREEN
ABO/RH(D): O POS
Antibody Screen: NEGATIVE

## 2025-01-22 LAB — I-STAT CG4 LACTIC ACID, ED: Lactic Acid, Venous: 1.3 mmol/L (ref 0.5–1.9)

## 2025-01-22 MED ORDER — SODIUM CHLORIDE 0.9 % IV BOLUS
1000.0000 mL | Freq: Once | INTRAVENOUS | Status: AC
  Administered 2025-01-22: 1000 mL via INTRAVENOUS

## 2025-01-22 NOTE — ED Notes (Addendum)
 Lab work is delayed because the patient is a very hard stick. IV team consult has been placed. Ultrasound IV is needed

## 2025-01-22 NOTE — Discharge Instructions (Signed)
 Try to hydrate yourself.  Follow-up with your doctor for your worsening kidney function and also for the blood in the urine.

## 2025-01-22 NOTE — ED Triage Notes (Addendum)
 BIB family from home for hematuria. Reports onset 1 hr ago. Endorses hematuria, general weakness, fatigue, nausea, dizziness. Denies pain, chills, fever, rectal bleeding, vomiting, diarrhea, or syncope. BP low in triage. Denies blood thinner. Mentions fell 2-3x yesterday. Alert, NAD, calm, interactive, steady gait.

## 2025-01-22 NOTE — ED Provider Notes (Signed)
 " [facility] AT [facility] Provider Note   CSN: [id number] Arrival date & time: 01/22/25  9147     Patient presents with: Hematuria   Stephanie Thornton is a 54 y.o. female.    Hematuria  Patient presents with report of blood in her urine.  Reportedly went to the bathroom today urinated and had some blood when she wiped and in the bowl.  Seen in the ER 2 days ago for high blood sugar dizziness.  Has had vomiting and abdominal cramping.  Reportedly has felt dizzy now.  States that she felt dizzy and fell 2-3 times in the last couple days.  Denies injury or fall.  Not on blood thinners.  Did not have a bowel movement this morning.  Did have some toast this morning.  Initial blood pressure on arrival 86 systolic but on recheck was 98.    Prior to Admission medications  Medication Sig Start Date End Date Taking? Authorizing Provider  Acetaminophen  (TYLENOL  PO) Take 2 tablets by mouth 3 (three) times daily as needed (tooth ache).   Yes [provider]  amitriptyline  (ELAVIL ) 150 MG tablet Take 150 mg by mouth at bedtime. 08/06/22  Yes [provider]  aspirin  81 MG chewable tablet Chew 1 tablet (81 mg total) by mouth daily. 05/09/22  Yes Zhao, Xika, NP  atenolol  (TENORMIN ) 50 MG tablet Take 50 mg by mouth daily. 10/24/23  Yes [provider]  baclofen  (LIORESAL ) 10 MG tablet Take 10 mg by mouth 3 (three) times daily as needed for muscle spasms. 05/05/22  Yes [provider]  busPIRone  (BUSPAR ) 15 MG tablet Take 15 mg by mouth 3 (three) times daily. 07/10/24  Yes [provider]  escitalopram  (LEXAPRO ) 5 MG tablet Take 5 mg by mouth every morning. 07/10/24  Yes [provider]  gabapentin  (NEURONTIN ) 300 MG capsule Take 300 mg by mouth 3 (three) times daily. 12/26/24  Yes [provider]  HUMALOG 100 UNIT/ML injection 60 UNITS DAILY VIA INSULIN  PUMP INJECTION 30 DAYS   Yes [provider]   hydrOXYzine  (ATARAX ) 25 MG tablet TAKE 1 TABLET BY ORAL ROUTE 3 TIMES PER DAY TAKE FOR ANXIETY, PANIC, AGITATION, AND SLEEP AS NEEDED   Yes [provider]  LATUDA  80 MG TABS tablet Take 80 mg by mouth every evening. 02/01/19  Yes [provider]  LINZESS  290 MCG CAPS capsule Take 290 mcg by mouth daily before breakfast. 11/01/24  Yes [provider]  lisinopril -hydrochlorothiazide  (ZESTORETIC ) 20-12.5 MG tablet Take 1 tablet by mouth 2 (two) times daily.   Yes [provider]  methimazole (TAPAZOLE) 10 MG tablet Take 10 mg by mouth daily. 01/04/25  Yes [provider]  nitroGLYCERIN  (NITROSTAT ) 0.4 MG SL tablet Place 1 tablet (0.4 mg total) under the tongue every 5 (five) minutes x 3 doses as needed for chest pain. 05/09/22  Yes Zhao, Xika, NP  ondansetron  (ZOFRAN ) 4 MG tablet Take 1 tablet (4 mg total) by mouth every 6 (six) hours as needed for nausea or vomiting. 01/20/25  Yes Elnor Savant A, DO  Oxycodone  HCl 10 MG TABS Take 10 mg by mouth 4 (four) times daily as needed (pain). 07/08/22  Yes [provider]  OZEMPIC, 0.25 OR 0.5 MG/DOSE, 2 MG/3ML SOPN Inject 0.5 mg into the skin once a week.   Yes [provider]  pantoprazole  (PROTONIX ) 40 MG tablet Take 40 mg by mouth daily. 02/19/24  Yes [provider]  polyethylene glycol (MIRALAX  / GLYCOLAX ) 17 g packet Take 17 g by mouth daily. Patient taking differently: Take 17 g by mouth daily as needed for moderate constipation. 10/19/24  Yes Franchot Novel, MD  rosuvastatin  (CRESTOR ) 20 MG tablet Take 20 mg by mouth at bedtime. 07/29/23  Yes [provider]  senna (SENOKOT) 8.6 MG TABS tablet Take 1 tablet (8.6 mg total) by mouth at bedtime. 10/18/24  Yes Franchot Novel, MD  sucralfate  (CARAFATE ) 1 g tablet Take 1 tablet (1 g total) by mouth with breakfast, with lunch, and with evening meal. 01/20/25 01/28/25 Yes Elnor Jayson LABOR, DO  SYMBICORT 160-4.5 MCG/ACT inhaler SMARTSIG:2  inhalation Via Inhaler Twice Daily PRN 04/24/24  Yes [provider]  topiramate  (TOPAMAX ) 200 MG tablet Take 200 mg by mouth at bedtime. 01/28/19  Yes [provider]  traZODone  (DESYREL ) 100 MG tablet Take 250 mg by mouth at bedtime. 02/24/19  Yes [provider]  Insulin  Pen Needle 31G X 5 MM MISC 1 Units by Does not apply route 4 (four) times daily. 09/07/23   Rojelio Nest, DO    Allergies: Pregabalin, Rocephin  [ceftriaxone  sodium in dextrose ], Sulfa antibiotics, Tramadol, Vancomycin, Glucotrol  [glipizide ], Keflex  [cephalexin ], Metformin , Penicillins, and Janumet  [sitagliptin  phos-metformin  hcl]    Review of Systems  Genitourinary:  Positive for hematuria.    Updated Vital Signs BP 107/75   Pulse 77   Temp (!) 97.5 F (36.4 C) (Oral)   Resp 17   Wt 78 kg   SpO2 97%   BMI 31.46 kg/m   Physical Exam Vitals and nursing note reviewed.  Cardiovascular:     Rate and Rhythm: Normal rate.  Pulmonary:     Breath sounds: No wheezing.  Abdominal:     Tenderness: There is no abdominal tenderness.  Skin:    Capillary Refill: Capillary refill takes less than 2 seconds.  Neurological:     Mental Status: She is alert and oriented to person, place, and time.     (all labs ordered are listed, but only abnormal results are displayed) Labs Reviewed  COMPREHENSIVE METABOLIC PANEL WITH GFR - Abnormal; Notable for the following components:      Result Value   Sodium 132 (*)    Potassium 3.3 (*)    Chloride 97 (*)    Glucose, Bld 214 (*)    BUN 29 (*)    Creatinine, Ser 1.89 (*)    AST 14 (*)    Alkaline Phosphatase 132 (*)    GFR, Estimated 31 (*)    All other components within normal limits  URINALYSIS, ROUTINE W REFLEX MICROSCOPIC - Abnormal; Notable for the following components:   APPearance TURBID (*)    Hgb urine dipstick LARGE (*)    Protein, ur 100 (*)    Leukocytes,Ua MODERATE (*)    Bacteria, UA MANY (*)    All other components within normal  limits  CBC WITH DIFFERENTIAL/PLATELET - Abnormal; Notable for the following components:   WBC 14.0 (*)    RBC 5.54 (*)    HCT 46.6 (*)    Neutro Abs 10.2 (*)    All other components within normal limits  CBG MONITORING, ED - Abnormal; Notable for the following components:   Glucose-Capillary 167 (*)    All other components within normal limits  I-STAT CG4 LACTIC ACID, ED  I-STAT CG4 LACTIC ACID, ED  TYPE AND SCREEN    EKG: EKG Interpretation Date/Time:  Saturday January 22 2025 09:11:55 EST Ventricular Rate:  10  PR Interval:  150 QRS Duration:  87 QT Interval:  411 QTC Calculation: 466 R Axis:   -44  Text Interpretation: Sinus rhythm LAE, consider biatrial enlargement Left anterior fascicular block Abnormal R-wave progression, late transition Borderline T wave abnormalities No significant change since last tracing Confirmed by Patsey Lot [phone]) on 01/22/2025 10:20:05 AM  Radiology: CT ABDOMEN PELVIS W CONTRAST Result Date: 01/20/2025 EXAM: CT ABDOMEN AND PELVIS WITH CONTRAST 01/20/2025 05:57:52 PM TECHNIQUE: CT of the abdomen and pelvis was performed with the administration of 100 mL of iohexol  (OMNIPAQUE ) 300 MG/ML solution. Multiplanar reformatted images are provided for review. Automated exposure control, iterative reconstruction, and/or weight-based adjustment of the mA/kV was utilized to reduce the radiation dose to as low as reasonably achievable. COMPARISON: 10/15/2024, 10/08/2024 chest, 08/27/2022 CLINICAL HISTORY: Epigastric pain. FINDINGS: LOWER CHEST: Posterior bibasilar dependent atelectasis. LIVER: Diffuse hepatic steatosis. GALLBLADDER AND BILE DUCTS: Gallbladder is unremarkable. No biliary ductal dilatation. SPLEEN: Similar appearance of a lobular hypodense mass in the spleen measuring 3.3 cm (previously, 2.7 cm). PANCREAS: No acute abnormality. ADRENAL GLANDS: No acute abnormality. KIDNEYS, URETERS AND BLADDER: 1 cm cyst in the upper pole of the right kidney. Per  consensus, no follow-up is needed for simple Bosniak type 1 and 2 renal cysts, unless the patient has a malignancy history or risk factors. Nonobstructive left upper pole calculus. No hydronephrosis. No perinephric or periureteral stranding. The urinary bladder is distended without focal abnormality. GI AND BOWEL: Stomach demonstrates no acute abnormality. There is no bowel obstruction. Normal appendix. Moderate volume fecal loading throughout the colon. PERITONEUM AND RETROPERITONEUM: No ascites. No free air. No free pelvic fluid. VASCULATURE: Aorta is normal in caliber. Diffuse aortoiliac atherosclerosis. LYMPH NODES: No lymphadenopathy. REPRODUCTIVE ORGANS: Hysterectomy. BONES AND SOFT TISSUES: Multilevel thoracolumbar osteophytosis. Bone islands again noted in the pelvis. No acute osseous abnormality. No focal soft tissue abnormality. IMPRESSION: 1. No acute findings in the abdomen or pelvis. 2. Redemonstrated lobulated hypodense splenic mass. This has slightly increased in size in the interim to 3.3 cm (previously 2.7 cm), this likely represents a benign lesion, possibly a hemangioma. Nonemergent contrast-enhanced abdominal magnetic resonance imaging recommended for confirmation. 3. Moderate volume fecal loading throughout the colon, as can be seen in constipation. 4. Hepatic steatosis. Electronically signed by: Rogelia Myers MD 01/20/2025 06:26 PM EST RP Workstation: [id number]     Procedures   Medications Ordered in the ED  sodium chloride  0.9 % bolus 1,000 mL (0 mLs Intravenous Stopped 01/22/25 1213)                                    Medical Decision Making Amount and/or Complexity of Data Reviewed Labs: ordered.   Patient hypotension.  Reported blood in the urine.  Also dizziness and near syncope/falls at home.  Denies injury from these episodes.  Reviewed note from recent ER visit 2 days ago.  Will get basic blood work and give fluid bolus at this time. Creatinine now increased.   However patient does not want to stay in the ER.  Has had a liter of fluid and states she feels better.  I think is reasonable to go home where she comes orally hydrate, although I would like more fluids here.  Can follow-up with PCP or urology for hematuria.  Will discharge home.     Final diagnoses:  Dehydration  Hematuria, unspecified type    ED Discharge Orders  None          Patsey Lot, MD 01/22/25 1504  "

## 2025-01-30 ENCOUNTER — Emergency Department (HOSPITAL_COMMUNITY)

## 2025-01-30 ENCOUNTER — Emergency Department (HOSPITAL_COMMUNITY): Admission: EM | Admit: 2025-01-30 | Discharge: 2025-01-30 | Disposition: A | Discharge status: Home / Self Care

## 2025-01-30 ENCOUNTER — Other Ambulatory Visit: Payer: Self-pay

## 2025-01-30 DIAGNOSIS — R310 Gross hematuria: Secondary | ICD-10-CM

## 2025-01-30 DIAGNOSIS — N761 Subacute and chronic vaginitis: Secondary | ICD-10-CM | POA: Insufficient documentation

## 2025-01-30 DIAGNOSIS — R161 Splenomegaly, not elsewhere classified: Secondary | ICD-10-CM | POA: Insufficient documentation

## 2025-01-30 DIAGNOSIS — E119 Type 2 diabetes mellitus without complications: Secondary | ICD-10-CM | POA: Diagnosis not present

## 2025-01-30 DIAGNOSIS — K5903 Drug induced constipation: Secondary | ICD-10-CM | POA: Insufficient documentation

## 2025-01-30 DIAGNOSIS — Z794 Long term (current) use of insulin: Secondary | ICD-10-CM | POA: Insufficient documentation

## 2025-01-30 DIAGNOSIS — I10 Essential (primary) hypertension: Secondary | ICD-10-CM | POA: Diagnosis not present

## 2025-01-30 DIAGNOSIS — R10A1 Flank pain, right side: Secondary | ICD-10-CM | POA: Diagnosis present

## 2025-01-30 DIAGNOSIS — Z79899 Other long term (current) drug therapy: Secondary | ICD-10-CM | POA: Insufficient documentation

## 2025-01-30 DIAGNOSIS — N200 Calculus of kidney: Secondary | ICD-10-CM | POA: Insufficient documentation

## 2025-01-30 DIAGNOSIS — Z8673 Personal history of transient ischemic attack (TIA), and cerebral infarction without residual deficits: Secondary | ICD-10-CM | POA: Diagnosis not present

## 2025-01-30 DIAGNOSIS — Z7982 Long term (current) use of aspirin: Secondary | ICD-10-CM | POA: Diagnosis not present

## 2025-01-30 LAB — URINALYSIS, ROUTINE W REFLEX MICROSCOPIC
Bilirubin Urine: NEGATIVE
Glucose, UA: NEGATIVE mg/dL
Ketones, ur: NEGATIVE mg/dL
Nitrite: NEGATIVE
Protein, ur: NEGATIVE mg/dL
Specific Gravity, Urine: 1.011 (ref 1.005–1.030)
pH: 6 (ref 5.0–8.0)

## 2025-01-30 LAB — CBC WITH DIFFERENTIAL/PLATELET
Abs Immature Granulocytes: 0.03 10*3/uL (ref 0.00–0.07)
Basophils Absolute: 0 10*3/uL (ref 0.0–0.1)
Basophils Relative: 0 %
Eosinophils Absolute: 0.5 10*3/uL (ref 0.0–0.5)
Eosinophils Relative: 6 %
HCT: 39.8 % (ref 36.0–46.0)
Hemoglobin: 12.5 g/dL (ref 12.0–15.0)
Immature Granulocytes: 0 %
Lymphocytes Relative: 34 %
Lymphs Abs: 2.9 10*3/uL (ref 0.7–4.0)
MCH: 26.7 pg (ref 26.0–34.0)
MCHC: 31.4 g/dL (ref 30.0–36.0)
MCV: 85 fL (ref 80.0–100.0)
Monocytes Absolute: 0.7 10*3/uL (ref 0.1–1.0)
Monocytes Relative: 8 %
Neutro Abs: 4.4 10*3/uL (ref 1.7–7.7)
Neutrophils Relative %: 52 %
Platelets: 256 10*3/uL (ref 150–400)
RBC: 4.68 MIL/uL (ref 3.87–5.11)
RDW: 13.9 % (ref 11.5–15.5)
WBC: 8.4 10*3/uL (ref 4.0–10.5)
nRBC: 0 % (ref 0.0–0.2)

## 2025-01-30 LAB — COMPREHENSIVE METABOLIC PANEL WITH GFR
ALT: 17 U/L (ref 0–44)
AST: 20 U/L (ref 15–41)
Albumin: 3.8 g/dL (ref 3.5–5.0)
Alkaline Phosphatase: 117 U/L (ref 38–126)
Anion gap: 11 (ref 5–15)
BUN: 12 mg/dL (ref 6–20)
CO2: 24 mmol/L (ref 22–32)
Calcium: 9.9 mg/dL (ref 8.9–10.3)
Chloride: 106 mmol/L (ref 98–111)
Creatinine, Ser: 1.06 mg/dL — ABNORMAL HIGH (ref 0.44–1.00)
GFR, Estimated: >60 mL/min
Glucose, Bld: 121 mg/dL — ABNORMAL HIGH (ref 70–99)
Potassium: 3.8 mmol/L (ref 3.5–5.1)
Sodium: 140 mmol/L (ref 135–145)
Total Bilirubin: <0.2 mg/dL (ref 0.0–1.2)
Total Protein: 7.1 g/dL (ref 6.5–8.1)

## 2025-01-30 MED ORDER — CIPROFLOXACIN HCL 500 MG PO TABS
500.0000 mg | ORAL_TABLET | Freq: Once | ORAL | Status: AC
  Administered 2025-01-30: 500 mg via ORAL
  Filled 2025-01-30: qty 1

## 2025-01-30 MED ORDER — IOHEXOL 300 MG/ML  SOLN
100.0000 mL | Freq: Once | INTRAMUSCULAR | Status: AC | PRN
  Administered 2025-01-30: 100 mL via INTRAVENOUS

## 2025-01-30 MED ORDER — POLYETHYLENE GLYCOL 3350 17 G PO PACK: 17.0000 g | PACK | Freq: Every day | ORAL | 0 refills | Status: AC | PRN

## 2025-01-30 MED ORDER — ONDANSETRON HCL 4 MG/2ML IJ SOLN
4.0000 mg | Freq: Once | INTRAMUSCULAR | Status: AC
  Administered 2025-01-30: 4 mg via INTRAVENOUS
  Filled 2025-01-30: qty 2

## 2025-01-30 MED ORDER — CIPROFLOXACIN HCL 500 MG PO TABS: 500.0000 mg | ORAL_TABLET | Freq: Two times a day (BID) | ORAL | 0 refills | Status: AC

## 2025-01-30 MED ORDER — FLUCONAZOLE 150 MG PO TABS: 150.0000 mg | ORAL_TABLET | Freq: Once | ORAL | 0 refills | Status: AC

## 2025-01-30 MED ORDER — MORPHINE SULFATE (PF) 4 MG/ML IV SOLN
4.0000 mg | Freq: Once | INTRAVENOUS | Status: AC
  Administered 2025-01-30: 4 mg via INTRAVENOUS
  Filled 2025-01-30: qty 1

## 2025-01-30 NOTE — Discharge Instructions (Addendum)
 As discussed, you will need to follow-up with your primary care provider for the splenic mass.  Please follow-up with your OB/GYN provider for your vaginitis.  Please also follow-up with urology for your nephrolithiasis and gross hematuria.  Call the urologist tomorrow morning and tell them that you were seen in the emergency department and need a follow-up appointment with them.  Seek emergency care if experiencing any new or worsening symptoms.  Alternating between 650 mg Tylenol  and 400 mg Advil : The best way to alternate taking Acetaminophen  (example Tylenol ) and Ibuprofen  (example Advil /Motrin ) is to take them 3 hours apart. For example, if you take ibuprofen  at 6 am you can then take Tylenol  at 9 am. You can continue this regimen throughout the day, making sure you do not exceed the recommended maximum dose for each drug.

## 2025-01-30 NOTE — ED Triage Notes (Signed)
 Pt BIBEMS d/t 3x falls within same day a wk ago, d/t low bp. Since then, hematuria. Today, the blood is heavier than usual.   Bp: 154/96 P: 80 O2: 98 Cbg: 199

## 2025-02-01 ENCOUNTER — Other Ambulatory Visit (HOSPITAL_COMMUNITY): Payer: Self-pay

## 2025-02-01 LAB — URINE CULTURE: Culture: 20000 — AB

## 2025-02-02 ENCOUNTER — Telehealth (HOSPITAL_BASED_OUTPATIENT_CLINIC_OR_DEPARTMENT_OTHER): Payer: Self-pay

## 2025-02-02 NOTE — Telephone Encounter (Signed)
 Post ED Visit - Positive Culture Follow-up  Culture report reviewed by antimicrobial stewardship pharmacist: [facility] []  Rankin Dee, Pharm.D. []  Venetia Gully, Pharm.D., BCPS AQ-ID []  Garrel Crews, Pharm.D., BCPS []  Almarie Lunger, [address].D., BCPS []  [place], [address].D., BCPS, AAHIVP []  Rosaline Bihari, Pharm.D., BCPS, AAHIVP []  Vernell Meier, PharmD, BCPS []  Latanya Hint, PharmD, BCPS []  Donald Medley, PharmD, BCPS []  Rocky Bold, PharmD []  Dorothyann Alert, PharmD, BCPS []  Morene Babe, PharmD  [facility] [x]  Damien Quiet, PharmD []  Romona Bliss, PharmD []  Dolphus Roller, PharmD []  Veva Seip, Rph []  Vernell Daunt) Leonce, PharmD []  Eva Allis, PharmD []  Rosaline Millet, PharmD []  Iantha Batch, PharmD []  Arvin Gauss, PharmD []  Wanda Hasting, PharmD []  Ronal Rav, PharmD []  Rocky Slade, PharmD []  Bard Jeans, PharmD   Positive urine culture Treated with Ciprofloxacin , organism sensitive to the same and no further patient follow-up is required at this time.  Ruth Camelia Elbe 02/02/2025, 12:12 PM
